# Patient Record
Sex: Female | Born: 1950
Health system: Southern US, Community
[De-identification: ages and names within clinical notes are randomized; demographics above are authoritative.]

## PROBLEM LIST (undated history)

## (undated) DIAGNOSIS — I5032 Chronic diastolic (congestive) heart failure: Secondary | ICD-10-CM

## (undated) DIAGNOSIS — F419 Anxiety disorder, unspecified: Secondary | ICD-10-CM

## (undated) DIAGNOSIS — I2510 Atherosclerotic heart disease of native coronary artery without angina pectoris: Secondary | ICD-10-CM

## (undated) DIAGNOSIS — F458 Other somatoform disorders: Secondary | ICD-10-CM

## (undated) DIAGNOSIS — F1320 Sedative, hypnotic or anxiolytic dependence, uncomplicated: Secondary | ICD-10-CM

## (undated) DIAGNOSIS — I10 Essential (primary) hypertension: Secondary | ICD-10-CM

## (undated) DIAGNOSIS — E7800 Pure hypercholesterolemia, unspecified: Secondary | ICD-10-CM

## (undated) DIAGNOSIS — F32A Depression, unspecified: Secondary | ICD-10-CM

## (undated) DIAGNOSIS — J309 Allergic rhinitis, unspecified: Secondary | ICD-10-CM

## (undated) DIAGNOSIS — G8929 Other chronic pain: Secondary | ICD-10-CM

## (undated) DIAGNOSIS — E876 Hypokalemia: Secondary | ICD-10-CM

## (undated) DIAGNOSIS — F329 Major depressive disorder, single episode, unspecified: Secondary | ICD-10-CM

## (undated) DIAGNOSIS — G43909 Migraine, unspecified, not intractable, without status migrainosus: Secondary | ICD-10-CM

## (undated) DIAGNOSIS — M79673 Pain in unspecified foot: Secondary | ICD-10-CM

## (undated) DIAGNOSIS — I519 Heart disease, unspecified: Secondary | ICD-10-CM

## (undated) DIAGNOSIS — M069 Rheumatoid arthritis, unspecified: Secondary | ICD-10-CM

## (undated) DIAGNOSIS — I4891 Unspecified atrial fibrillation: Secondary | ICD-10-CM

## (undated) DIAGNOSIS — F332 Major depressive disorder, recurrent severe without psychotic features: Secondary | ICD-10-CM

## (undated) DIAGNOSIS — G5600 Carpal tunnel syndrome, unspecified upper limb: Secondary | ICD-10-CM

## (undated) HISTORY — DX: Other chronic pain: G89.29

## (undated) HISTORY — DX: Major depressive disorder, single episode, unspecified: F32.9

## (undated) HISTORY — DX: Chronic diastolic (congestive) heart failure: I50.32

## (undated) HISTORY — DX: Essential (primary) hypertension: I10

## (undated) HISTORY — DX: Atherosclerotic heart disease of native coronary artery without angina pectoris: I25.10

## (undated) HISTORY — DX: Hypokalemia: E87.6

## (undated) HISTORY — PX: CARPAL TUNNEL RELEASE: SHX101

## (undated) HISTORY — PX: OTHER SURGICAL HISTORY: SHX169

## (undated) HISTORY — DX: Migraine, unspecified, not intractable, without status migrainosus: G43.909

## (undated) HISTORY — DX: Other somatoform disorders: F45.8

## (undated) HISTORY — DX: Unspecified atrial fibrillation: I48.91

## (undated) HISTORY — DX: Anxiety disorder, unspecified: F41.9

## (undated) HISTORY — DX: Rheumatoid arthritis, unspecified: M06.9

## (undated) HISTORY — DX: Pain in unspecified foot: M79.673

## (undated) HISTORY — DX: Heart disease, unspecified: I51.9

## (undated) HISTORY — DX: Depression, unspecified: F32.A

## (undated) HISTORY — DX: Allergic rhinitis, unspecified: J30.9

## (undated) HISTORY — DX: Pure hypercholesterolemia, unspecified: E78.00

---

## 1984-02-26 DIAGNOSIS — J189 Pneumonia, unspecified organism: Secondary | ICD-10-CM

## 1984-02-26 HISTORY — DX: Pneumonia, unspecified organism: J18.9

## 1998-02-25 HISTORY — PX: OTHER SURGICAL HISTORY: SHX169

## 1998-12-13 ENCOUNTER — Other Ambulatory Visit: Admission: RE | Admit: 1998-12-13 | Discharge: 1998-12-13 | Payer: Self-pay | Admitting: Obstetrics and Gynecology

## 1999-02-26 HISTORY — PX: VAGINAL HYSTERECTOMY: SUR661

## 1999-08-24 ENCOUNTER — Other Ambulatory Visit: Admission: RE | Admit: 1999-08-24 | Discharge: 1999-08-24 | Payer: Self-pay | Admitting: Obstetrics and Gynecology

## 1999-08-24 ENCOUNTER — Encounter (INDEPENDENT_AMBULATORY_CARE_PROVIDER_SITE_OTHER): Payer: Self-pay

## 1999-09-25 ENCOUNTER — Inpatient Hospital Stay (HOSPITAL_COMMUNITY): Admission: RE | Admit: 1999-09-25 | Discharge: 1999-09-27 | Payer: Self-pay | Admitting: Obstetrics and Gynecology

## 1999-09-25 ENCOUNTER — Encounter (INDEPENDENT_AMBULATORY_CARE_PROVIDER_SITE_OTHER): Payer: Self-pay

## 1999-10-24 ENCOUNTER — Encounter: Admission: RE | Admit: 1999-10-24 | Discharge: 1999-10-24 | Payer: Self-pay | Admitting: Family Medicine

## 1999-10-24 ENCOUNTER — Encounter: Payer: Self-pay | Admitting: Family Medicine

## 2000-02-26 HISTORY — PX: TOTAL ABDOMINAL HYSTERECTOMY W/ BILATERAL SALPINGOOPHORECTOMY: SHX83

## 2000-12-19 ENCOUNTER — Encounter: Payer: Self-pay | Admitting: Family Medicine

## 2000-12-19 ENCOUNTER — Encounter: Admission: RE | Admit: 2000-12-19 | Discharge: 2000-12-19 | Payer: Self-pay | Admitting: Family Medicine

## 2001-12-21 ENCOUNTER — Encounter: Admission: RE | Admit: 2001-12-21 | Discharge: 2001-12-21 | Payer: Self-pay | Admitting: Family Medicine

## 2001-12-21 ENCOUNTER — Encounter: Payer: Self-pay | Admitting: Family Medicine

## 2003-05-03 ENCOUNTER — Encounter: Admission: RE | Admit: 2003-05-03 | Discharge: 2003-05-03 | Payer: Self-pay | Admitting: Family Medicine

## 2004-06-08 ENCOUNTER — Encounter: Admission: RE | Admit: 2004-06-08 | Discharge: 2004-06-08 | Payer: Self-pay | Admitting: Obstetrics and Gynecology

## 2005-05-28 ENCOUNTER — Ambulatory Visit (HOSPITAL_BASED_OUTPATIENT_CLINIC_OR_DEPARTMENT_OTHER): Admission: RE | Admit: 2005-05-28 | Discharge: 2005-05-28 | Payer: Self-pay | Admitting: Orthopedic Surgery

## 2005-06-13 ENCOUNTER — Encounter: Admission: RE | Admit: 2005-06-13 | Discharge: 2005-06-13 | Payer: Self-pay | Admitting: Family Medicine

## 2006-02-25 HISTORY — PX: FOOT SURGERY: SHX648

## 2006-08-28 ENCOUNTER — Encounter: Admission: RE | Admit: 2006-08-28 | Discharge: 2006-08-28 | Payer: Self-pay | Admitting: Family Medicine

## 2007-09-29 ENCOUNTER — Encounter: Admission: RE | Admit: 2007-09-29 | Discharge: 2007-09-29 | Payer: Self-pay | Admitting: Family Medicine

## 2008-10-05 ENCOUNTER — Encounter: Admission: RE | Admit: 2008-10-05 | Discharge: 2008-10-05 | Payer: Self-pay | Admitting: Family Medicine

## 2008-10-12 ENCOUNTER — Encounter: Admission: RE | Admit: 2008-10-12 | Discharge: 2008-10-12 | Payer: Self-pay | Admitting: Family Medicine

## 2009-11-15 ENCOUNTER — Encounter: Admission: RE | Admit: 2009-11-15 | Discharge: 2009-11-15 | Payer: Self-pay | Admitting: Family Medicine

## 2010-03-18 ENCOUNTER — Encounter: Payer: Self-pay | Admitting: Family Medicine

## 2010-06-23 ENCOUNTER — Emergency Department (HOSPITAL_COMMUNITY)
Admission: EM | Admit: 2010-06-23 | Discharge: 2010-06-24 | Disposition: A | Discharge status: Other Acute Inpatient Hospital | Payer: BC Managed Care – PPO | Attending: Emergency Medicine | Admitting: Emergency Medicine

## 2010-06-23 DIAGNOSIS — F19239 Other psychoactive substance dependence with withdrawal, unspecified: Secondary | ICD-10-CM | POA: Insufficient documentation

## 2010-06-23 DIAGNOSIS — F19939 Other psychoactive substance use, unspecified with withdrawal, unspecified: Secondary | ICD-10-CM | POA: Insufficient documentation

## 2010-06-23 DIAGNOSIS — F341 Dysthymic disorder: Secondary | ICD-10-CM | POA: Insufficient documentation

## 2010-06-23 DIAGNOSIS — F410 Panic disorder [episodic paroxysmal anxiety] without agoraphobia: Secondary | ICD-10-CM | POA: Insufficient documentation

## 2010-06-23 DIAGNOSIS — R110 Nausea: Secondary | ICD-10-CM | POA: Insufficient documentation

## 2010-06-23 DIAGNOSIS — F1920 Other psychoactive substance dependence, uncomplicated: Secondary | ICD-10-CM | POA: Insufficient documentation

## 2010-06-23 LAB — RAPID URINE DRUG SCREEN, HOSP PERFORMED
Amphetamines: NOT DETECTED
Barbiturates: NOT DETECTED
Benzodiazepines: POSITIVE — AB
Cocaine: NOT DETECTED
Opiates: POSITIVE — AB

## 2010-06-23 LAB — CBC
HCT: 44 % (ref 36.0–46.0)
Hemoglobin: 14.8 g/dL (ref 12.0–15.0)
MCH: 30.1 pg (ref 26.0–34.0)
MCHC: 33.6 g/dL (ref 30.0–36.0)
MCV: 89.6 fL (ref 78.0–100.0)
Platelets: 215 K/uL (ref 150–400)
RBC: 4.91 MIL/uL (ref 3.87–5.11)
RDW: 13.1 % (ref 11.5–15.5)
WBC: 4.4 K/uL (ref 4.0–10.5)

## 2010-06-23 LAB — DIFFERENTIAL
Basophils Absolute: 0 10*3/uL (ref 0.0–0.1)
Basophils Relative: 1 % (ref 0–1)
Eosinophils Absolute: 0.2 10*3/uL (ref 0.0–0.7)
Eosinophils Relative: 4 % (ref 0–5)
Monocytes Absolute: 0.3 10*3/uL (ref 0.1–1.0)
Monocytes Relative: 7 % (ref 3–12)
Neutro Abs: 2.7 10*3/uL (ref 1.7–7.7)

## 2010-06-23 LAB — ETHANOL: Alcohol, Ethyl (B): <5 mg/dL (ref 0–10)

## 2010-06-23 LAB — COMPREHENSIVE METABOLIC PANEL WITH GFR
ALT: 15 U/L (ref 0–35)
AST: 20 U/L (ref 0–37)
Albumin: 3.7 g/dL (ref 3.5–5.2)
Alkaline Phosphatase: 63 U/L (ref 39–117)
BUN: 9 mg/dL (ref 6–23)
CO2: 28 meq/L (ref 19–32)
Calcium: 9.4 mg/dL (ref 8.4–10.5)
Chloride: 105 meq/L (ref 96–112)
Creatinine, Ser: 0.64 mg/dL (ref 0.4–1.2)
GFR calc non Af Amer: >60 mL/min
Glucose, Bld: 97 mg/dL (ref 70–99)
Potassium: 3.7 meq/L (ref 3.5–5.1)
Sodium: 141 meq/L (ref 135–145)
Total Bilirubin: 0.4 mg/dL (ref 0.3–1.2)
Total Protein: 6.5 g/dL (ref 6.0–8.3)

## 2010-06-24 ENCOUNTER — Inpatient Hospital Stay (HOSPITAL_COMMUNITY)
Admission: EM | Admit: 2010-06-24 | Discharge: 2010-06-27 | DRG: 745 | Disposition: A | Discharge status: Home / Self Care | Payer: BC Managed Care – PPO | Source: Intra-hospital | Attending: Psychiatry | Admitting: Psychiatry

## 2010-06-24 DIAGNOSIS — E785 Hyperlipidemia, unspecified: Secondary | ICD-10-CM

## 2010-06-24 DIAGNOSIS — E7800 Pure hypercholesterolemia, unspecified: Secondary | ICD-10-CM

## 2010-06-24 DIAGNOSIS — F1120 Opioid dependence, uncomplicated: Principal | ICD-10-CM

## 2010-06-24 DIAGNOSIS — F329 Major depressive disorder, single episode, unspecified: Secondary | ICD-10-CM

## 2010-06-24 DIAGNOSIS — G8929 Other chronic pain: Secondary | ICD-10-CM

## 2010-06-24 DIAGNOSIS — F3289 Other specified depressive episodes: Secondary | ICD-10-CM

## 2010-06-26 DIAGNOSIS — F1120 Opioid dependence, uncomplicated: Secondary | ICD-10-CM

## 2010-06-26 DIAGNOSIS — F339 Major depressive disorder, recurrent, unspecified: Secondary | ICD-10-CM

## 2010-06-29 NOTE — Discharge Summary (Signed)
Meghan Schwartz                  ACCOUNT NO.:  [id number]  MEDICAL RECORD NO.:  [id number]           PATIENT TYPE:  I  LOCATION:  0303                          FACILITY[facility]  [facility]  PHYSICIAN:  Anselm Jungling, MD  DATE OF BIRTH:  1950/09/09  DATE OF ADMISSION:  06/24/2010 DATE OF DISCHARGE:  06/27/2010                              DISCHARGE SUMMARY   IDENTIFYING DATA AND REASON FOR ADMISSION:  This was an inpatient psychiatric admission for Meghan Schwartz, a 60 year old female who was admitted for treatment of opiate dependence and depression.  Please refer to the admission note for further details pertaining to the symptoms, circumstances and history that led to her hospitalization.  She was given an initial Axis I diagnosis of opiate dependence NOS and depressive disorder NOS.  MEDICAL AND LABORATORY:  The patient was medically and physically assessed by the psychiatric nurse practitioner.  She was in generally good health without any active or chronic medical problems.  She was continued on her usual regimen of Allegra, naproxen, calcium carbonate, estradiol and simvastatin.  There were no significant medical issues during her stay.  HOSPITAL COURSE:  The patient was admitted to the adult inpatient psychiatric service.  She presented as a well-nourished, normally- developed adult female who was very pleasant and cooperative.  Mood was sad, but there were no indications of suicidal risk at any time.  She was continued on her usual regimen of Celexa and Effexor, antidepressants that she had been taking previously.  The patient has a history of a foot deformity which had been causing chronic pain.  Use of opiate analgesics for that pain was what led to her opiate dependence.  She was fully in acknowledgment of the need to be detoxified from opiates and to avoid them for the rest of her life.  She participated in therapeutic groups and activities and was an excellent participant  throughout her stay.  She worked closely with Case Management and the undersigned toward an aftercare plan that could address her various treatment needs.  Ultimately, she agreed to be enrolled in the [facility] chemical dependency intensive outpatient program.  She was interviewed by Charmian Muff, program coordinator, and accepted into the program.  She was discharged on May 2nd with a plan to begin the intensive outpatient program on May 4th, the next day that it was to me.  DISCHARGE MEDICATIONS: 1. Celexa 20 mg daily. 2. Effexor XR 37.5 mg b.i.d. 3. Allegra 180 mg daily. 4. Naproxen 500 mg b.i.d. 5. Calcium carbonate tablet 1 daily. 6. Estradiol 1 mg daily. 7. Simvastatin 20 mg daily.  DISCHARGE DIAGNOSES:  Axis I:  Opiate dependence, pain disorder not otherwise specified, depressive disorder not otherwise specified. Axis II:  Deferred. Axis III:  History of SEASONAL ALLERGIES, chronic pain, hyperlipidemia. Axis IV:  Stressors severe. Axis V:  Global Assessment of Functioning on discharge 45.  On discharge, the suicide risk assessment was completed, and she was rated at minimal risk.     Anselm Jungling, MD     SPB/MEDQ  D:  06/28/2010  T:  06/28/2010  Job:  [id number]  Electronically Signed by Geralyn Flash MD on 06/29/2010 09:03:11 AM

## 2010-07-02 NOTE — H&P (Signed)
NAMEADYN, HOES                  ACCOUNT NO.:  [id number]  MEDICAL RECORD NO.:  [id number]           PATIENT TYPE:  I  LOCATION:  0303                          [facility]ACILITY[facility]  [facility]  PHYSICIAN:  Anselm Jungling, MD  DATE OF BIRTH:  [birth year]  DATE OF ADMISSION:  06/24/2010 DATE OF DISCHARGE:                      PSYCHIATRIC ADMISSION ASSESSMENT   HISTORY:  This is a voluntary admission to the services of Dr. Electa Sniff. This is a 60 year old married white female.  Apparently, about 2 years ago, it became apparent that she was going to need some foot surgery; she had surgery on her other foot back in 2001, and she was delaying the surgery because she was getting ready to retire.  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  She has been getting, originally they started her on some tramadol for her foot pain and then it was escalated to the Vicodin.  She innocently became addicted to the Vicodin, and April 4 after she got her refill she has decided she did not want any drugs in her life anymore and she through the pills away.  She went into withdrawal that night and had to go see her primary care the next day. They worked on a taper plan, but she found she was taking more out of anxiety-related to her symptoms when she through the pills away.  She decided to come for help with detoxing from it so that she could continue her life without addictive medications.  PAST PSYCHIATRIC HISTORY:  She does not have any social history.  She received her BA in 1973.  She has been married once for 39 years.  She has a daughter 14.  a son 65.  She recently retired at the end of January.  She was a Runner, broadcasting/film/video.  FAMILY HISTORY:  Negative.  ALCOHOL AND DRUG HISTORY:  Negative.  PRIMARY CARE PROVIDER:  Candie Echevaria, M.D.  PSYCHIATRIC CARE:  She does not have any.  MEDICAL PROBLEMS:  She requires estrogen replacement.  She also has hypercholesteremia and she has a bunion and tendon issues  in her foot.  MEDICATIONS:  She is currently prescribed tramadol 8 mg t.i.d., odansetron 8 mg t.i.d., Vicodin 7.5/500 b.i.d., Celexa 20 mg daily, vitamin D three 400 mg daily, vitamin B12 100 mcg daily, simvastatin 20 mg at bedtime, Centrum Silver 1 tablet daily, Allegra 180 mg daily, tramadol 100 mg t.i.d. Xanax 0.25 mg p.r.n., vitamin C 1000 mg daily, venlafaxine 37.5 mg b.i.d., estradiol 1 mg daily, Tums 1 tablet daily and aspirin 81 mg daily.  DRUG ALLERGIES:  She has no known drug allergies.  POSITIVE PHYSICAL FINDINGS:  She was medically cleared in the ED at [facility].  Her temperature was afebrile 98.3.  Her blood pressure was elevated at 178/113.  Her pulse was 80, respirations were 20.  Her UDS was positive for opiates and benzodiazepines; however, she is prescribed.  Alcohol level was less than 5.  She had no abnormalities of CBC or electrolytes.  She had no other lab work done.  MENTAL STATUS EXAM:  She was seen in her room.  She was  in bed.  She was appropriately groomed, dressed and nourished.  Her speech is within normal range.  Her mood is anxious but she feels that she will be able to withdraw here safely.  Her affect has a normal range.  Thought processes are clear, rational and goal-oriented.  Judgment, insight are intact.  Concentration and memory are intact.  Intelligence is at least average.  She denies being suicidal or homicidal.  She denies any auditory or visual hallucinations.  DIAGNOSIS:  AXIS I:  Opioid dependence, here for medically supervised withdrawal and let me make this clear; she has not been abusive.  This was an innocent dependence. AXIS II:  Deferred. AXIS III:  Hypercholesteremia requires estrogen replacement. AXIS IV:  Mild recent retirement and substance dependence. AXIS V:  45.  PLAN:  The plan is to admit for safety and stabilization. She will be helped to withdraw from the hydrocodone through use of the clonidine detox protocol.  Her  other meds will be adjusted if indicated and estimated length of stay is about a week.     Mickie Leonarda Salon, P.A.-C.   ______________________________ Anselm Jungling, MD    MD/MEDQ  D:  06/24/2010  T:  06/24/2010  Job:  [id number]  cc:   Otilio Connors. Gerri Spore, M.D. Fax: [phone]  Electronically Signed by Jaci Lazier ADAMS P.A.-C. on 06/30/2010 12:49:29 PM Electronically Signed by Geralyn Flash MD on 07/02/2010 12:11:38 PM

## 2010-07-12 ENCOUNTER — Ambulatory Visit (HOSPITAL_BASED_OUTPATIENT_CLINIC_OR_DEPARTMENT_OTHER): Payer: BC Managed Care – PPO | Admitting: Psychology

## 2010-07-12 DIAGNOSIS — F1120 Opioid dependence, uncomplicated: Secondary | ICD-10-CM

## 2010-07-13 NOTE — Discharge Summary (Signed)
[facility]  Patient:    Meghan Schwartz, Meghan Schwartz                         MRN: [id number] Adm. Date:  [id number] Disc. Date: [id number] Attending:  Michaele Offer                           Discharge Summary  ADMISSION DIAGNOSIS:   Symptomatic leiomyomatous uterus  DISCHARGE DIAGNOSIS:   Symptomatic leiomyomatous uterus and adenomyosis.  PROCEDURES:  Total abdominal hysterectomy and bilateral salpingo-oophorectomy.  COMPLICATIONS:  None.  CONSULTATIONS:  None.  HISTORY AND PHYSICAL:  Briefly, this is a 60 year old white female, gravida 2, para 2-0-0-2, with heavy menses which are gradually getting heavier to the point where she can use 30 tampons a day.  She has missed plane flights due to her bleeding and has severe cramping and burning also.  She has not had success with medical therapy and desires surgical therapy.  Ultrasound has revealed multiple small leiomyomas.  PAST MEDICAL HISTORY:  Significant for two cesarsean sections without complications, pneumonia in 1985, tonsillectomy, tubal ligation with one of her C sections.  ALLERGIES:  Nausea with ERYTHROMYCIN.  PHYSICAL EXAMINATION:  Significant for a benign abdomen with a well-healed transverse scar.  On pelvic exam, she has a 10 to 12-week size, irregular uterus which is mobile, no adnexal masses.  HOSPITAL COURSE:  The patient was admitted on the day of surgery and underwent the above-mentioned procedure under general anesthesia with an estimated blood loss of 200 cc.  She tolerated the procedure very well.  Preoperative hemoglobin was 12.0, postoperative was 10.9.  The patient was rapidly able to ambulated and tolerated diet.  On the evening of postoperative day #2, she was tolerating a regular diet, had remained afebrile for approximately 24 hours, was ambulating, and was felt to be stable enough for discharge home.  Her incision was healing well.  At this time, her staples were removed  and Steri-Strips applied.  Final pathology reveals benign cervix, proliferative endometrium, extensive adenomyosis, and few benign leiomyomata with normal tubes and ovaries.  CONDITION ON DISCHARGE:  Stable.  DISPOSITION:  Discharge to home.  DIET:  Regular.  ACTIVITY:  Pelvic rest.  No driving, no strenuous activity.  FOLLOWUP:  In 10 days for an incision check.  DISCHARGE MEDICATIONS:  Percocet p.r.n. pain and estradiol 1 mg p.o. q.d. for estrogen replacement therapy. DD:  09/27/99 TD:  09/29/99 Job: [id number]

## 2010-07-13 NOTE — Op Note (Signed)
Meghan Schwartz, Meghan Schwartz                  ACCOUNT NO.:  [id number]   MEDICAL RECORD NO.:  [id number]          PATIENT TYPE:  AMB   LOCATION:  DSC                          FACILITY:  [facility]   PHYSICIAN:  Leonides Grills, M.D.     DATE OF BIRTH:  08-01-1950   DATE OF PROCEDURE:  05/28/2005  DATE OF DISCHARGE:                                 OPERATIVE REPORT   PREOPERATIVE DIAGNOSES:  1.  Left hallux valgus.  2.  Left second and third metatarsalgia.  3.  Left second through fourth hammer toes.   POSTOPERATIVE DIAGNOSES:  1.  Left hallux valgus.  2.  Left second and third metatarsalgia.  3.  Left second through fourth hammer toes.   OPERATION PERFORMED:  1.  Left chevron bunionectomy.  2.  Stress x-rays left foot.  3.  Left second and third Weil metatarsal shortening osteotomies.  4.  Left second through fourth toes metatarsophalangeal joint dorsal      capsulotomy with split collateral release.  5.  Left second through fourth toes proximal phalanx head resections.  6.  Left second through fourth toes extensor digitorum brevis to extensor      digitorum longus tendon transfers.   SURGEON:  Leonides Grills, M.D.   ASSISTANT:  Lianne Cure, P.A.   ANESTHESIA:  General.   ESTIMATED BLOOD LOSS:  Minimal.   TOURNIQUET TIME:  Approximately an hour and 20 minutes.   COMPLICATIONS:  None.   DISPOSITION:  Stable to PR.   INDICATIONS FOR PROCEDURE:  The patient is a 59 year old female who has had  longstanding left forefoot pain that was interfering with her life to the  point she can not do what she wants to do secondary to the above pathology.  She has consented for the above procedure.  All risks which include  infection, neurovascular injury, nonunion, malunion, hardware irritation,  hardware failure, persistent pain, worsening pain, prolonged recovery,  stiffness, or arthritis were all explained, questions were encouraged and  answered.   DESCRIPTION OF PROCEDURE:  The patient was  brought to the operating room and  placed in supine position after adequate general endotracheal tube  anesthesia was administered as well as Ancef 1 g IV piggyback.  The left  lower extremity was prepped and draped in sterile manner over a proximally  placed thigh tourniquet.  The limb was gravity exsanguinated.  Tourniquet  was elevated to 290 mmHg.  A longitudinal incision over the medial aspect of  midline of the left great toe MTP joint was then made.  Dissection was  carried down through the skin.  Hemostasis was obtained.  Neurovascular  structures were identified both superiorly and inferiorly and protected  throughout this case.  L-shaped capsulotomy was then made.  Simple  bunionectomy was then performed with a sagittal saw.  [name]  was rounded off with a rongeur.  Lateral capsule was then released with a  curved Beaver blade.  Chevron osteotomy was then made with a sagittal saw  protecting the soft tissues superiorly and inferiorly and centering on the  metatarsal head.  The head was then translated approximately 3 to 4 mm  laterally and then fixed with a 2.0 mm fully threaded cortical set screw  using a 1.5 mm drill hole respectively. This had excellent purchase and  maintenance of the correction.  The redundant bone medially was then trimmed  off with the sagittal saw.  The area and joint were then copiously irrigated  with normal saline.  Capsule was reconstructed and advanced both superiorly  and proximally and repaired with 2-0 Vicryl suture.  This had an Interior and spatial designer. Range of motion of the toe was excellent as well.  Stress x-rays  were obtained in the AP and lateral plane and showed no gross motion across  the osteotomy site. Fixation in proper position and correction was excellent  as well.  We then made a longitudinal incision over the second toe.  Dissection was carried down through the skin and hemostasis was obtained.  Extensor digitorum longus  and brevis tendons were identified and the longus  was tenotomized proximal medial and the brevis distal lateral and retracted  out of harm's way for later transfer.  Dorsal MTP joint capsulotomy with  collateral release was then performed with a 15 blade scalpel protecting the  soft tissues both medially and laterally by an assistant.  The distal aspect  of the proximal phalanx head was then skeletonized and the head was then  removed with rongeur followed by a bone cutter.  This cut was made  perpendicular to the long axis of the proximal phalanx.  The same exact  procedure was performed for the third and fourth toes through separate  incisions.  Once we were to this stage, we then plantar flexed the second  toe and performed a Weil osteotomy parallel to the plantar aspect of the  foot. This was done with stacked sagittal saw blades. The head was then  translated about 3 or 4 mm proximally and then fixed with a 1.5 mm fully  threaded cortical lag screw using 1.5, 1.1 mm drill hole respectively. This  had excellent purchase and compression across the osteotomy.  The redundant  bone shelf dorsally was then trimmed off with a rongeur and the joint and  area were copiously irrigated with normal saline.  We did the same exact  procedure for the third metatarsal as well through the separate incision and  stress x-rays were obtained in the AP and lateral planes and showed that the  fixation and correction were excellent as well as the right length.  The  length was also visualized directly through both wounds at the same time as  well.  The area and joints were copiously irrigated with normal saline.  We  then placed a K-wire through the middle and distal phalanx antegrade holding  the toe reduced in the PIP joint and firing this retrograde holding the toe  in the reduced position.  We did this for the second, third and fourth toes respectively.  We then performed and EDB to EDL tendon transfer  using 3-0  PDS suture.  This had an Conservation officer, historic buildings.  Tourniquet was deflated.  Hemostasis was obtained.  Skin relieving incisions were made on either side  of the K-wire.  The K-wires were bent, cut and capped.  Toes pinked up  nicely __________ at the tip. There was no pulsatile bleeding.  The skin was  closed with 4-0 nylon.  Sterile dressing was applied.  Roger Mann dressing  was applied.  Hard sole shoe was  applied.  The patient was stable to the PR.      Leonides Grills, M.D.  Electronically Signed     PB/MEDQ  D:  05/28/2005  T:  05/29/2005  Job:  [id number]

## 2010-07-13 NOTE — H&P (Signed)
[facility]  Patient:    Meghan Schwartz                          MRN: [id number] Adm. Date:  [id number] Attending:  Michaele Offer                         History and Physical  CHIEF COMPLAINT:  Menorrhagia and leiomyomatous uterus.  HISTORY OF PRESENT ILLNESS:  This is a 60 year old white female, gravida 2, para 2-0-0-2, with an LMP August 04, 1999, whom I saw on June 29 as a referral from Dr. Clyde Canterbury.  She complains of heavy menses for 20 years, which are gradually getting heavier.  She passes clots and at the heaviest, she uses 30 tampons a day.  She has bled so much that she has missed plane flights due to her bleeding and also has severe cramping and burning with her periods.  She has irregular menses, had one in December 2000, February of this year, April of this year, June of this year, which lasted for 21 days.  She also complains of hot flashes since January of this year.  She has tried Vioxx for these symptoms but cannot take this because it causes tachycardia, and she has tried oral contraceptives in the past but cannot tolerate these because she feels heavy and bloated.  She has no dyspareunia, has no significant bowel symptoms, but does have some urinary frequency.  She has had a pelvic ultrasound performed December 1999, which revealed several leiomyomas.  Endometrial biopsy done here on June 29 revealed degenerating mixed-phase endometrium.  On exam, she has an enlarged uterus consistent with leiomyomas, and she is admitted for surgical therapy of this.  PAST OBSTETRICAL HISTORY:  Significant for cesarean sections in 1978 and 1983 without complications.  PAST MEDICAL HISTORY:  Pneumonia in 1985.  PAST SURGICAL HISTORY:  Tonsillectomy, tubal ligation, and two cesarean sections.  ALLERGIES:  None known, but she does have nausea with erythromycin.  MEDICATIONS:  Vitamin C and E.  GYNECOLOGIC HISTORY:  Menarche at age 47.  Current  cycle is every 44-70 days, and they are heavy with severe cramping.  She has no history of an abnormal Pap smear.  No history of sexually transmitted diseases.  SOCIAL HISTORY:  Patient is married, smokes about five cigarettes a day, and denies significant alcohol abuse.  The patient is employed by [employer].  FAMILY HISTORY:  Mother had breast cancer at age 69 and has died due to this.  REVIEW OF SYSTEMS:  Otherwise negative.  PHYSICAL EXAMINATION:  VITAL SIGNS:  Weight is 130 pounds.  GENERAL:  She is a well-developed, well-nourished white female, who is in no acute distress.  HEENT:  Pupils equally round and reactive to light and accommodation, and her extraocular muscles are intact.  Oropharynx was clear without erythema or exudates.  NECK:  Supple without lymphadenopathy or thyromegaly.  LUNGS:  Clear to auscultation.  HEART:  Regular rate and rhythm without murmurs.  ABDOMEN:  Soft, nontender, nondistended, without a palpable mass.  She has a well-healed transverse scar.  EXTREMITIES:  Extremities have no edema, are nontender, and DTRs are 2/4 and symmetric.  PELVIC:  External genitalia is without lesions.  Speculum exam reveals blood in the vaginal vault, and a Pipelle sounded to 8 cm.  On bimanual exam, she has a 10-12 week size irregular uterus, which is mobile, and she  has no adnexal masses.  LABORATORY DATA:  Labs reveal an office hemoglobin of 10.4 and an FSH of 20.  ASSESSMENT:  Abnormal uterine bleeding with a symptomatic leiomyomatous uterus.  Medical and surgical options have been discussed with the patient. Medical options included taking iron, NSAIDs, and waiting for menopause, or treatment with Lupron, and surgical treatments consisted of myomectomy or hysterectomy.  The patient desires definitive therapy with hysterectomy. Risks of this procedure have been discussed with the patient, including bleeding, infection, damage to the  surrounding organs.  We have also agreed to remove her ovaries at the same time, and risks and benefits of estrogen replacement have been discussed with the patient.  She has been on iron for the past four weeks preoperatively.  PLAN:  Admit the patient for a total abdominal hysterectomy, bilateral salpingo-oophorectomy. DD:  09/25/99 TD:  09/25/99 Job: [id number]

## 2010-07-13 NOTE — Op Note (Signed)
[facility]  Patient:    Meghan Schwartz                          MRN: [id number] Proc. Date: 09/25/99 Adm. Date:  [id number] Attending:  Michaele Offer                           Operative Report  PREOPERATIVE DIAGNOSIS:  Symptomatic leiomyomatous uterus.  POSTOPERATIVE DIAGNOSIS:  Symptomatic leiomyomatous uterus.  PROCEDURE:  Total abdominal hysterectomy and bilateral salpingo-oophorectomy.  SURGEON:  Zenaida Niece, M.D.  ASSISTANT:  Alvino Chapel, M.D.  ANESTHESIA:  General endotracheal tube.  ESTIMATED BLOOD LOSS:  200 cc.  ______ PROPHYLAXIS:  Ancef 1 g preoperative.  FINDINGS:  The uterus slightly enlarged with normal tubes and ovaries.  She also had a normal appendix.  Counts were correct and condition stable.  DESCRIPTION OF PROCEDURE:  After appropriate informed consent was obtained, the patient was taken to the operating room and placed in the dorsal supine position.  General anesthesia was induced and her abdomen was prepped and draped in the usual sterile fashion and a Foley catheter inserted.  The vagina was also prepped with Betadine.  Her abdomen was then entered via her previous Pfannenstiel incision.  A self-retaining retractor was placed and the bowels were packed out of the pelvis.  Inspection revealed the above mentioned findings with the uterus slightly enlarged and slightly irregular, consistent with small leiomyomas.  Both uterine cornu were grasped with long Kellys.  The round ligaments were divided with electrocautery and the anterior peritoneum incised to create the bladder flap.  Both infundibulopelvic ligaments were isolated, clamped, transected, and doubly ligated with #1 chromic.  The uterine arteries were then skeletonized.  Pitressin was injected medially to both uterine arteries to help _______ blood loss.  This was 10 units and 10 cc of normal saline.  The uterine arteries were then clamped,  transected, and ligated with #1 chromic.  Cardinal ligaments and uterosacral ligaments were clamped, transected, and ligated with #1 chromic.  The vaginal angles were then clamped with _______ clamps and the vagina entered.  The uterus with an intact cervix was then removed sharply.  The vaginal angles were ligated with #1 chromic and the remainder of the vagina closed with two interrupted figure-of-eight sutures of #1 chromic.  The uterosacral ligaments were then plicated in the midline with one suture of 2-0 silk.  The previously tagged uterosacral ligament pedicles were also tied across the midline.  The pelvis was then irrigated and small bleeders from the peritoneum controlled with electrocautery.  Both ureters were identified and found to be well below the incision areas and of normal caliber.  The pelvis was again inspected and found to be hemostatic.  The bowel packs were removed and the self-retaining retractor was removed.  The subfascial space was then irrigated and made hemostatic with electrocautery.  The fascia was closed in a running fashion starting at both ends and meeting in the middle with 0 Vicryl.  The subcutaneous tissue was irrigated and made hemostatic with electrocautery and the skin was closed with staples.  The patient was awakened in the operating room, tolerating the procedure well and was taken to the recovery room in stable condition. DD:  09/25/99 TD:  09/26/99 Job: [id number]

## 2010-07-26 ENCOUNTER — Encounter (HOSPITAL_BASED_OUTPATIENT_CLINIC_OR_DEPARTMENT_OTHER): Payer: BC Managed Care – PPO | Admitting: Psychology

## 2010-07-26 DIAGNOSIS — F1120 Opioid dependence, uncomplicated: Secondary | ICD-10-CM

## 2010-12-03 ENCOUNTER — Other Ambulatory Visit: Payer: Self-pay | Admitting: Family Medicine

## 2010-12-03 DIAGNOSIS — Z1231 Encounter for screening mammogram for malignant neoplasm of breast: Secondary | ICD-10-CM

## 2010-12-25 ENCOUNTER — Ambulatory Visit
Admission: RE | Admit: 2010-12-25 | Discharge: 2010-12-25 | Disposition: A | Discharge status: Home / Self Care | Payer: BC Managed Care – PPO | Source: Ambulatory Visit | Attending: Family Medicine | Admitting: Family Medicine

## 2010-12-25 DIAGNOSIS — Z1231 Encounter for screening mammogram for malignant neoplasm of breast: Secondary | ICD-10-CM

## 2011-01-01 ENCOUNTER — Other Ambulatory Visit: Payer: Self-pay | Admitting: Family Medicine

## 2011-01-01 DIAGNOSIS — R928 Other abnormal and inconclusive findings on diagnostic imaging of breast: Secondary | ICD-10-CM

## 2011-01-18 ENCOUNTER — Ambulatory Visit
Admission: RE | Admit: 2011-01-18 | Discharge: 2011-01-18 | Disposition: A | Discharge status: Home / Self Care | Payer: BC Managed Care – PPO | Source: Ambulatory Visit | Attending: Family Medicine | Admitting: Family Medicine

## 2011-01-18 DIAGNOSIS — R928 Other abnormal and inconclusive findings on diagnostic imaging of breast: Secondary | ICD-10-CM

## 2012-03-06 ENCOUNTER — Other Ambulatory Visit: Payer: Self-pay | Admitting: Family Medicine

## 2012-03-06 DIAGNOSIS — Z1231 Encounter for screening mammogram for malignant neoplasm of breast: Secondary | ICD-10-CM

## 2012-03-30 ENCOUNTER — Ambulatory Visit: Payer: BC Managed Care – PPO

## 2012-04-10 ENCOUNTER — Ambulatory Visit: Payer: BC Managed Care – PPO

## 2012-06-22 ENCOUNTER — Ambulatory Visit
Admission: RE | Admit: 2012-06-22 | Discharge: 2012-06-22 | Disposition: A | Discharge status: Home / Self Care | Payer: BC Managed Care – PPO | Source: Ambulatory Visit | Attending: Family Medicine | Admitting: Family Medicine

## 2012-06-22 DIAGNOSIS — Z1231 Encounter for screening mammogram for malignant neoplasm of breast: Secondary | ICD-10-CM

## 2014-06-08 ENCOUNTER — Encounter: Payer: Self-pay | Admitting: Neurology

## 2014-06-08 ENCOUNTER — Ambulatory Visit (INDEPENDENT_AMBULATORY_CARE_PROVIDER_SITE_OTHER): Payer: BC Managed Care – PPO | Admitting: Neurology

## 2014-06-08 VITALS — BP 140/88 | HR 85 | Temp 98.0°F | Ht 64.0 in | Wt 146.5 lb

## 2014-06-08 DIAGNOSIS — G5601 Carpal tunnel syndrome, right upper limb: Secondary | ICD-10-CM

## 2014-06-08 DIAGNOSIS — G5600 Carpal tunnel syndrome, unspecified upper limb: Secondary | ICD-10-CM

## 2014-06-08 DIAGNOSIS — G5602 Carpal tunnel syndrome, left upper limb: Secondary | ICD-10-CM

## 2014-06-08 DIAGNOSIS — G5603 Carpal tunnel syndrome, bilateral upper limbs: Secondary | ICD-10-CM

## 2014-06-08 HISTORY — DX: Carpal tunnel syndrome, unspecified upper limb: G56.00

## 2014-06-08 NOTE — Patient Instructions (Signed)
Overall you are doing fairly well but I do want to suggest a few things today:   Remember to drink plenty of fluid, eat healthy meals and do not skip any meals. Try to eat protein with a every meal and eat a healthy snack such as fruit or nuts in between meals. Try to keep a regular sleep-wake schedule and try to exercise daily, particularly in the form of walking, 20-30 minutes a day, if you can.   As far as diagnostic testing: emg/ncs  I would like to see you back in a week for emg/ncs, sooner if we need to. Please call us with any interim questions, concerns, problems, updates or refill requests.   Please also call us for any test results so we can go over those with you on the phone.  My clinical assistant and will answer any of your questions and relay your messages to me and also relay most of my messages to you.   Our phone number is [phone]. We also have an after hours call service for urgent matters and there is a physician on-call for urgent questions. For any emergencies you know to call 911 or go to the nearest emergency room

## 2014-06-08 NOTE — Progress Notes (Signed)
[facility]    Provider:  Dr Lucia Gaskins Referring Provider: Carolin Coy, MD Primary Care Physician:  Frederich Chick, MD  CC:  Hand numbness  HPI:  Meghan Schwartz is a 64 y.o. female here as a referral from Dr. Gerri Spore for hand numbness. PMHx RA.  She has hand numbness for 15 years. Used to feel it when she was scared on the highway. Heavy rain. Immediate hand numbness then it would go away. Getting worse now, now numb all the time and prickly. Now she is waking up in the middle of the night and the pain wakes her up. Worse when typing like on an Ipad. Tingly now, all the fingers, weakness of grip, drops things, can't open a lid. Dropping objects. Wrist splints at night didn't help. No radiation in the forearms. In all the fingers. No neck pain. No radicular symptoms. Both are symmetric. She has RA. Her first injection of Enbrel helped with numbness.   Reviewed notes, labs and imaging from outside physicians, which showed: CC+ (37), RF+. Hand U/S - symmetric Synovitis without erosions. BUN 14. Creatinine 0.6.   Review of Systems: Patient complains of symptoms per HPI as well as the following symptoms: fatigue, feeling hot, flushing, joint pain, joint swelling, allergies, memory loss, confusion, headache, numbness, anxiety, allergies. Pertinent negatives per HPI. All others negative.   History   Social History  . Marital Status: Married    Spouse Name: Arpita Fentress  . Number of Children: 2  . Years of Education: Ba   Occupational History  . Retired     Social History Main Topics  . Smoking status: Former Smoker -- 20 years    Types: Cigarettes    Quit date: 02/25/2010  . Smokeless tobacco: Not on file  . Alcohol Use: No  . Drug Use: No  . Sexual Activity: Not on file   Other Topics Concern  . Not on file   Social History Narrative   Lives at home with spouse.    Right handed.   Caffeine use: 2 cups coffee/day    8 oz soda/day        History  reviewed. No pertinent family history.  Past Medical History  Diagnosis Date  . RA (rheumatoid arthritis)   . Anxiety   . High cholesterol     Past Surgical History  Procedure Laterality Date  . Foot surgery Left 2008  . Vaginal hysterectomy  2001  . Cesarean section  1978. 1983    Current Outpatient Prescriptions  Medication Sig Dispense Refill  . amphetamine-dextroamphetamine (ADDERALL XR) 10 MG 24 hr capsule Take 10 mg by mouth as needed.  0  . Ascorbic Acid (VITAMIN C) 1000 MG tablet Take 1,000 mg by mouth daily.    . citalopram (CELEXA) 40 MG tablet Take 40 mg by mouth daily.  0  . estradiol (ESTRACE) 1 MG tablet Take 1 mg by mouth daily.  0  . etanercept (ENBREL) 50 MG/ML injection Inject 50 mg into the skin once a week. Self injection once per week    . Multiple Vitamins-Minerals (CENTRUM SILVER ULTRA WOMENS PO) Take 1 tablet by mouth daily.    . simvastatin (ZOCOR) 20 MG tablet Take 20 mg by mouth daily.  0  . traMADol (ULTRAM) 50 MG tablet Take 50 mg by mouth as needed.  0  . Vitamin D, Cholecalciferol, 1000 UNITS CAPS Take 1 capsule by mouth daily.     No current facility-administered medications for this  visit.    Allergies as of 06/08/2014  . (No Known Allergies)    Vitals: BP 140/88 mmHg  Pulse 85  Temp(Src) 98 F (36.7 C)  Ht 5\' 4"  (1.626 m)  Wt 146 lb 8 oz (66.452 kg)  BMI 25.13 kg/m2 Last Weight:  Wt Readings from Last 1 Encounters:  06/08/14 146 lb 8 oz (66.452 kg)   Last Height:   Ht Readings from Last 1 Encounters:  06/08/14 5\' 4"  (1.626 m)    Physical exam: Exam: Gen: NAD, conversant, well nourised, well groomed                     CV: RRR, no MRG. No Carotid Bruits. No peripheral edema, warm, nontender Eyes: Conjunctivae clear without exudates or hemorrhage  Neuro: Detailed Neurologic Exam  Speech:    Speech is normal; fluent and spontaneous with normal comprehension.  Cognition:    The patient is oriented to person, place, and  time;     recent and remote memory intact;     language fluent;     normal attention, concentration,     fund of knowledge Cranial Nerves:    The pupils are equal, round, and reactive to light. The fundi are normal and spontaneous venous pulsations are present. Visual fields are full to finger confrontation. Extraocular movements are intact. Trigeminal sensation is intact and the muscles of mastication are normal. The face is symmetric. The palate elevates in the midline. Hearing intact. Voice is normal. Shoulder shrug is normal. The tongue has normal motion without fasciculations.   Coordination:    Normal finger to nose and heel to shin. Normal rapid alternating movements.   Gait:    Heel-toe and tandem gait are normal.   Motor Observation:    Distal atrophy in the hands Tone:    Normal muscle tone.    Posture:    Posture is normal. normal erect    Strength:    Strength is V/V in the upper and lower limbs.      Sensation: intact to LT     Reflex Exam:  DTR's:    Deep tendon reflexes in the upper and lower extremities are normal bilaterally.   Toes:    The toes are downgoing bilaterally.   Clonus:    Clonus is absent.      Assessment/Plan:  64 year old female with RA and complaints of paresthesias in the hands.   EMG/NCS bilat to evaluate for bilateral NCS in the next few weeks  , MD  [facility] [address] [place], [address] [place]  Phone [phone] Fax [phone]

## 2014-06-16 ENCOUNTER — Ambulatory Visit (INDEPENDENT_AMBULATORY_CARE_PROVIDER_SITE_OTHER): Payer: BC Managed Care – PPO | Admitting: Neurology

## 2014-06-16 ENCOUNTER — Ambulatory Visit (INDEPENDENT_AMBULATORY_CARE_PROVIDER_SITE_OTHER): Payer: Self-pay | Admitting: Neurology

## 2014-06-16 DIAGNOSIS — G5602 Carpal tunnel syndrome, left upper limb: Secondary | ICD-10-CM

## 2014-06-16 DIAGNOSIS — G5603 Carpal tunnel syndrome, bilateral upper limbs: Secondary | ICD-10-CM

## 2014-06-16 DIAGNOSIS — G5601 Carpal tunnel syndrome, right upper limb: Secondary | ICD-10-CM | POA: Diagnosis not present

## 2014-06-16 NOTE — Progress Notes (Signed)
See procedure note.

## 2014-06-16 NOTE — Procedures (Signed)
  [facility]    Provider: Dr Ahern Referring Provider: Westermann, Carola, MD Primary Care Physician: WEBB, CAROL D, MD  CC: Hand numbness  HPI: Meghan Schwartz is a 63 y.o. female here as a referral from Dr. Westermann for hand numbness. PMHx RA.  She has hand numbness for 15 years. Used to feel it when she was scared on the highway. Heavy rain. Immediate hand numbness then it would go away. Getting worse now, now numb all the time and prickly. Now she is waking up in the middle of the night and the pain wakes her up. Worse when typing like on an Ipad. Tingly now, all the fingers, weakness of grip, drops things, can't open a lid. Dropping objects. Wrist splints at night didn't help. No radiation in the forearms. In all the fingers. No neck pain. No radicular symptoms. Both are symmetric. She has RA. Her first injection of Enbrel helped with numbness.    Summary:   Nerve Conduction Studies were performed on the bilateral upper extremities.  The right median APB motor nerve showed prolonged distal onset latency (5.5 ms, N<4.0)  The right Median 2nd Digit sensory nerve showed prolonged distal peak latency (5.3 ms, N<3.9). F Wave studies indicate that the right Median F wave has delayed latency(34.6, N<34ms).  The left median APB motor nerve showed prolonged distal onset latency (7.4 ms, N<4.0) and reduced amplitude (.17mV, N>3) The left Median 2nd Digit sensory nerve showed no response F Wave studies indicate that the left Median F wave has delayed latency(37, N<34ms).   Bilateral Ulnar ADM motor nerves were within normal limits. F Wave studies indicate that the bilateral Ulnar F waves have normal latencies  The bilateralUlnar 5th digit sensory nerves were within normal limits.  EMG needle study of selected right upper extremity muscles was performed. The following muscles were attempted: the left opponens pollicis. EMG needle exam was terminated prematurely at  patient's request due to pain.   Conclusion: This is an abnormal but limited exam study. There is electrophysiologic evidence of bilateral moderately-severe left > right Carpal Tunnel Syndrome. No suggestion of polyneuropathy or radiculopathy. Clinical correlation recommended.    Antonia Ahern, MD  [facility] [address] [place], [place] [address]  Phone [phone] Fax [phone] 

## 2014-06-16 NOTE — Progress Notes (Signed)
  [facility]    Provider: Dr Lucia Gaskins Referring Provider: Carolin Coy, MD Primary Care Physician: Frederich Chick, MD  CC: Hand numbness  HPI: Meghan Schwartz is a 64 y.o. female here as a referral from Dr. Gerri Spore for hand numbness. PMHx RA.  She has hand numbness for 15 years. Used to feel it when she was scared on the highway. Heavy rain. Immediate hand numbness then it would go away. Getting worse now, now numb all the time and prickly. Now she is waking up in the middle of the night and the pain wakes her up. Worse when typing like on an Ipad. Tingly now, all the fingers, weakness of grip, drops things, can't open a lid. Dropping objects. Wrist splints at night didn't help. No radiation in the forearms. In all the fingers. No neck pain. No radicular symptoms. Both are symmetric. She has RA. Her first injection of Enbrel helped with numbness.    Summary:   Nerve Conduction Studies were performed on the bilateral upper extremities.  The right median APB motor nerve showed prolonged distal onset latency (5.5 ms, N<4.0)  The right Median 2nd Digit sensory nerve showed prolonged distal peak latency (5.3 ms, N<3.9). F Wave studies indicate that the right Median F wave has delayed latency(34.6, N<13ms).  The left median APB motor nerve showed prolonged distal onset latency (7.4 ms, N<4.0) and reduced amplitude (.22mV, N>3) The left Median 2nd Digit sensory nerve showed no response F Wave studies indicate that the left Median F wave has delayed latency(37, N<29ms).   Bilateral Ulnar ADM motor nerves were within normal limits. F Wave studies indicate that the bilateral Ulnar F waves have normal latencies  The bilateralUlnar 5th digit sensory nerves were within normal limits.  EMG needle study of selected right upper extremity muscles was performed. The following muscles were attempted: the left opponens pollicis. EMG needle exam was terminated prematurely at  patient's request due to pain.   Conclusion: This is an abnormal but limited exam study. There is electrophysiologic evidence of bilateral moderately-severe left > right Carpal Tunnel Syndrome. No suggestion of polyneuropathy or radiculopathy. Clinical correlation recommended.    Naomie Dean, MD  [facility] [address] [place], [place] [address]  Phone [phone] Fax [phone]

## 2014-08-15 DIAGNOSIS — G5602 Carpal tunnel syndrome, left upper limb: Secondary | ICD-10-CM | POA: Diagnosis not present

## 2014-08-15 DIAGNOSIS — M79642 Pain in left hand: Secondary | ICD-10-CM | POA: Diagnosis not present

## 2014-08-15 DIAGNOSIS — M79641 Pain in right hand: Secondary | ICD-10-CM | POA: Diagnosis not present

## 2014-08-15 DIAGNOSIS — G5601 Carpal tunnel syndrome, right upper limb: Secondary | ICD-10-CM | POA: Diagnosis not present

## 2014-08-18 DIAGNOSIS — M2550 Pain in unspecified joint: Secondary | ICD-10-CM | POA: Diagnosis not present

## 2014-08-18 DIAGNOSIS — M79643 Pain in unspecified hand: Secondary | ICD-10-CM | POA: Diagnosis not present

## 2014-08-18 DIAGNOSIS — G5600 Carpal tunnel syndrome, unspecified upper limb: Secondary | ICD-10-CM | POA: Diagnosis not present

## 2014-08-18 DIAGNOSIS — M0579 Rheumatoid arthritis with rheumatoid factor of multiple sites without organ or systems involvement: Secondary | ICD-10-CM | POA: Diagnosis not present

## 2014-09-16 DIAGNOSIS — F900 Attention-deficit hyperactivity disorder, predominantly inattentive type: Secondary | ICD-10-CM | POA: Diagnosis not present

## 2014-11-01 DIAGNOSIS — G5602 Carpal tunnel syndrome, left upper limb: Secondary | ICD-10-CM | POA: Diagnosis not present

## 2014-11-07 DIAGNOSIS — Z4789 Encounter for other orthopedic aftercare: Secondary | ICD-10-CM | POA: Diagnosis not present

## 2014-11-07 DIAGNOSIS — G5601 Carpal tunnel syndrome, right upper limb: Secondary | ICD-10-CM | POA: Diagnosis not present

## 2014-11-14 DIAGNOSIS — N951 Menopausal and female climacteric states: Secondary | ICD-10-CM | POA: Diagnosis not present

## 2014-11-14 DIAGNOSIS — F3342 Major depressive disorder, recurrent, in full remission: Secondary | ICD-10-CM | POA: Diagnosis not present

## 2014-11-14 DIAGNOSIS — L304 Erythema intertrigo: Secondary | ICD-10-CM | POA: Diagnosis not present

## 2014-11-14 DIAGNOSIS — E780 Pure hypercholesterolemia: Secondary | ICD-10-CM | POA: Diagnosis not present

## 2014-11-14 DIAGNOSIS — Z23 Encounter for immunization: Secondary | ICD-10-CM | POA: Diagnosis not present

## 2014-11-14 DIAGNOSIS — G8929 Other chronic pain: Secondary | ICD-10-CM | POA: Diagnosis not present

## 2014-11-14 DIAGNOSIS — Z1211 Encounter for screening for malignant neoplasm of colon: Secondary | ICD-10-CM | POA: Diagnosis not present

## 2014-11-14 DIAGNOSIS — M1990 Unspecified osteoarthritis, unspecified site: Secondary | ICD-10-CM | POA: Diagnosis not present

## 2014-11-14 DIAGNOSIS — I10 Essential (primary) hypertension: Secondary | ICD-10-CM | POA: Diagnosis not present

## 2014-11-18 DIAGNOSIS — M79642 Pain in left hand: Secondary | ICD-10-CM | POA: Diagnosis not present

## 2014-12-09 DIAGNOSIS — F900 Attention-deficit hyperactivity disorder, predominantly inattentive type: Secondary | ICD-10-CM | POA: Diagnosis not present

## 2014-12-26 DIAGNOSIS — M79672 Pain in left foot: Secondary | ICD-10-CM | POA: Diagnosis not present

## 2014-12-26 DIAGNOSIS — M0579 Rheumatoid arthritis with rheumatoid factor of multiple sites without organ or systems involvement: Secondary | ICD-10-CM | POA: Diagnosis not present

## 2014-12-26 DIAGNOSIS — M79671 Pain in right foot: Secondary | ICD-10-CM | POA: Diagnosis not present

## 2014-12-26 DIAGNOSIS — M79642 Pain in left hand: Secondary | ICD-10-CM | POA: Diagnosis not present

## 2014-12-26 DIAGNOSIS — M79641 Pain in right hand: Secondary | ICD-10-CM | POA: Diagnosis not present

## 2015-01-04 DIAGNOSIS — M1990 Unspecified osteoarthritis, unspecified site: Secondary | ICD-10-CM | POA: Diagnosis not present

## 2015-03-01 DIAGNOSIS — M0579 Rheumatoid arthritis with rheumatoid factor of multiple sites without organ or systems involvement: Secondary | ICD-10-CM | POA: Diagnosis not present

## 2015-03-01 DIAGNOSIS — M79642 Pain in left hand: Secondary | ICD-10-CM | POA: Diagnosis not present

## 2015-03-01 DIAGNOSIS — M79641 Pain in right hand: Secondary | ICD-10-CM | POA: Diagnosis not present

## 2015-03-28 DIAGNOSIS — J329 Chronic sinusitis, unspecified: Secondary | ICD-10-CM | POA: Diagnosis not present

## 2015-03-28 DIAGNOSIS — M1990 Unspecified osteoarthritis, unspecified site: Secondary | ICD-10-CM | POA: Diagnosis not present

## 2015-03-31 DIAGNOSIS — F900 Attention-deficit hyperactivity disorder, predominantly inattentive type: Secondary | ICD-10-CM | POA: Diagnosis not present

## 2015-06-02 ENCOUNTER — Other Ambulatory Visit: Payer: Self-pay

## 2015-06-02 DIAGNOSIS — Z0000 Encounter for general adult medical examination without abnormal findings: Secondary | ICD-10-CM | POA: Diagnosis not present

## 2015-06-02 DIAGNOSIS — E782 Mixed hyperlipidemia: Secondary | ICD-10-CM | POA: Diagnosis not present

## 2015-06-02 DIAGNOSIS — Z1231 Encounter for screening mammogram for malignant neoplasm of breast: Secondary | ICD-10-CM

## 2015-06-02 DIAGNOSIS — M1990 Unspecified osteoarthritis, unspecified site: Secondary | ICD-10-CM | POA: Diagnosis not present

## 2015-06-02 DIAGNOSIS — Z1211 Encounter for screening for malignant neoplasm of colon: Secondary | ICD-10-CM | POA: Diagnosis not present

## 2015-06-02 DIAGNOSIS — I10 Essential (primary) hypertension: Secondary | ICD-10-CM | POA: Diagnosis not present

## 2015-06-22 ENCOUNTER — Ambulatory Visit
Admission: RE | Admit: 2015-06-22 | Discharge: 2015-06-22 | Disposition: A | Discharge status: Home / Self Care | Payer: Medicare Other | Source: Ambulatory Visit

## 2015-06-22 DIAGNOSIS — Z1231 Encounter for screening mammogram for malignant neoplasm of breast: Secondary | ICD-10-CM

## 2015-06-23 DIAGNOSIS — F900 Attention-deficit hyperactivity disorder, predominantly inattentive type: Secondary | ICD-10-CM | POA: Diagnosis not present

## 2015-07-03 DIAGNOSIS — M8589 Other specified disorders of bone density and structure, multiple sites: Secondary | ICD-10-CM | POA: Diagnosis not present

## 2015-07-03 DIAGNOSIS — Z780 Asymptomatic menopausal state: Secondary | ICD-10-CM | POA: Diagnosis not present

## 2015-08-14 DIAGNOSIS — M069 Rheumatoid arthritis, unspecified: Secondary | ICD-10-CM | POA: Diagnosis not present

## 2015-09-12 DIAGNOSIS — Z79899 Other long term (current) drug therapy: Secondary | ICD-10-CM | POA: Diagnosis not present

## 2015-09-12 DIAGNOSIS — M0579 Rheumatoid arthritis with rheumatoid factor of multiple sites without organ or systems involvement: Secondary | ICD-10-CM | POA: Diagnosis not present

## 2015-09-12 DIAGNOSIS — M79641 Pain in right hand: Secondary | ICD-10-CM | POA: Diagnosis not present

## 2015-09-12 DIAGNOSIS — M79642 Pain in left hand: Secondary | ICD-10-CM | POA: Diagnosis not present

## 2015-09-15 DIAGNOSIS — F900 Attention-deficit hyperactivity disorder, predominantly inattentive type: Secondary | ICD-10-CM | POA: Diagnosis not present

## 2015-12-01 DIAGNOSIS — Z23 Encounter for immunization: Secondary | ICD-10-CM | POA: Diagnosis not present

## 2015-12-01 DIAGNOSIS — M1990 Unspecified osteoarthritis, unspecified site: Secondary | ICD-10-CM | POA: Diagnosis not present

## 2015-12-01 DIAGNOSIS — M069 Rheumatoid arthritis, unspecified: Secondary | ICD-10-CM | POA: Diagnosis not present

## 2015-12-04 DIAGNOSIS — Z1211 Encounter for screening for malignant neoplasm of colon: Secondary | ICD-10-CM | POA: Diagnosis not present

## 2015-12-08 DIAGNOSIS — F900 Attention-deficit hyperactivity disorder, predominantly inattentive type: Secondary | ICD-10-CM | POA: Diagnosis not present

## 2015-12-13 DIAGNOSIS — M79642 Pain in left hand: Secondary | ICD-10-CM | POA: Diagnosis not present

## 2015-12-13 DIAGNOSIS — M0579 Rheumatoid arthritis with rheumatoid factor of multiple sites without organ or systems involvement: Secondary | ICD-10-CM | POA: Diagnosis not present

## 2015-12-13 DIAGNOSIS — M79641 Pain in right hand: Secondary | ICD-10-CM | POA: Diagnosis not present

## 2015-12-13 DIAGNOSIS — Z79899 Other long term (current) drug therapy: Secondary | ICD-10-CM | POA: Diagnosis not present

## 2016-01-25 DIAGNOSIS — J019 Acute sinusitis, unspecified: Secondary | ICD-10-CM | POA: Diagnosis not present

## 2016-03-29 DIAGNOSIS — F900 Attention-deficit hyperactivity disorder, predominantly inattentive type: Secondary | ICD-10-CM | POA: Diagnosis not present

## 2016-06-03 DIAGNOSIS — F331 Major depressive disorder, recurrent, moderate: Secondary | ICD-10-CM | POA: Diagnosis not present

## 2016-06-03 DIAGNOSIS — F5101 Primary insomnia: Secondary | ICD-10-CM | POA: Diagnosis not present

## 2016-06-03 DIAGNOSIS — Z1211 Encounter for screening for malignant neoplasm of colon: Secondary | ICD-10-CM | POA: Diagnosis not present

## 2016-06-03 DIAGNOSIS — E782 Mixed hyperlipidemia: Secondary | ICD-10-CM | POA: Diagnosis not present

## 2016-06-03 DIAGNOSIS — N951 Menopausal and female climacteric states: Secondary | ICD-10-CM | POA: Diagnosis not present

## 2016-06-03 DIAGNOSIS — Z0000 Encounter for general adult medical examination without abnormal findings: Secondary | ICD-10-CM | POA: Diagnosis not present

## 2016-06-03 DIAGNOSIS — G894 Chronic pain syndrome: Secondary | ICD-10-CM | POA: Diagnosis not present

## 2016-06-03 DIAGNOSIS — Z23 Encounter for immunization: Secondary | ICD-10-CM | POA: Diagnosis not present

## 2016-06-03 DIAGNOSIS — I10 Essential (primary) hypertension: Secondary | ICD-10-CM | POA: Diagnosis not present

## 2016-06-27 DIAGNOSIS — Z1211 Encounter for screening for malignant neoplasm of colon: Secondary | ICD-10-CM | POA: Diagnosis not present

## 2016-09-13 DIAGNOSIS — F900 Attention-deficit hyperactivity disorder, predominantly inattentive type: Secondary | ICD-10-CM | POA: Diagnosis not present

## 2016-12-10 ENCOUNTER — Other Ambulatory Visit: Payer: Self-pay | Admitting: Family Medicine

## 2016-12-10 DIAGNOSIS — Z1231 Encounter for screening mammogram for malignant neoplasm of breast: Secondary | ICD-10-CM

## 2016-12-27 ENCOUNTER — Ambulatory Visit
Admission: RE | Admit: 2016-12-27 | Discharge: 2016-12-27 | Disposition: A | Discharge status: Home / Self Care | Payer: Medicare Other | Source: Ambulatory Visit | Attending: Family Medicine | Admitting: Family Medicine

## 2016-12-27 ENCOUNTER — Encounter: Payer: Self-pay | Admitting: Radiology

## 2016-12-27 DIAGNOSIS — Z1231 Encounter for screening mammogram for malignant neoplasm of breast: Secondary | ICD-10-CM

## 2017-10-01 ENCOUNTER — Emergency Department (HOSPITAL_COMMUNITY)
Admission: EM | Admit: 2017-10-01 | Discharge: 2017-10-02 | Disposition: A | Discharge status: Other Acute Inpatient Hospital | Payer: Medicare Other | Attending: Emergency Medicine | Admitting: Emergency Medicine

## 2017-10-01 ENCOUNTER — Encounter (HOSPITAL_COMMUNITY): Payer: Self-pay

## 2017-10-01 DIAGNOSIS — F1120 Opioid dependence, uncomplicated: Secondary | ICD-10-CM | POA: Insufficient documentation

## 2017-10-01 DIAGNOSIS — R51 Headache: Secondary | ICD-10-CM | POA: Insufficient documentation

## 2017-10-01 DIAGNOSIS — R45851 Suicidal ideations: Secondary | ICD-10-CM | POA: Insufficient documentation

## 2017-10-01 DIAGNOSIS — F32A Depression, unspecified: Secondary | ICD-10-CM

## 2017-10-01 DIAGNOSIS — Z79899 Other long term (current) drug therapy: Secondary | ICD-10-CM | POA: Insufficient documentation

## 2017-10-01 DIAGNOSIS — Z87891 Personal history of nicotine dependence: Secondary | ICD-10-CM | POA: Insufficient documentation

## 2017-10-01 DIAGNOSIS — F329 Major depressive disorder, single episode, unspecified: Secondary | ICD-10-CM | POA: Insufficient documentation

## 2017-10-01 DIAGNOSIS — M069 Rheumatoid arthritis, unspecified: Secondary | ICD-10-CM | POA: Insufficient documentation

## 2017-10-01 NOTE — ED Triage Notes (Signed)
Pt arrived by EMS from home. EMS reports that Pt has been taking extra of her medications and is now missing 30 Hydrocodone, 14 Klonopin, and 24 Tramadol. EMS reports that they found Pt lying at the top her stairs on arrival. Pt  Is disoriented to time year. Pt is suicidal with no plan.

## 2017-10-01 NOTE — ED Notes (Signed)
Bed: RESA Expected date:  Expected time:  Means of arrival:  Comments: EMS/overdose 

## 2017-10-01 NOTE — ED Notes (Signed)
ED Provider at bedside. 

## 2017-10-02 ENCOUNTER — Emergency Department (HOSPITAL_COMMUNITY): Payer: Medicare Other

## 2017-10-02 ENCOUNTER — Inpatient Hospital Stay (HOSPITAL_COMMUNITY)
Admission: AD | Admit: 2017-10-02 | Discharge: 2017-10-05 | DRG: 885 | Disposition: A | Discharge status: Home / Self Care | Payer: Medicare Other | Source: Intra-hospital | Attending: Psychiatry | Admitting: Psychiatry

## 2017-10-02 ENCOUNTER — Encounter (HOSPITAL_COMMUNITY): Payer: Self-pay

## 2017-10-02 ENCOUNTER — Other Ambulatory Visit: Payer: Self-pay

## 2017-10-02 DIAGNOSIS — F329 Major depressive disorder, single episode, unspecified: Secondary | ICD-10-CM | POA: Diagnosis present

## 2017-10-02 DIAGNOSIS — Z803 Family history of malignant neoplasm of breast: Secondary | ICD-10-CM | POA: Diagnosis not present

## 2017-10-02 DIAGNOSIS — F1120 Opioid dependence, uncomplicated: Secondary | ICD-10-CM | POA: Diagnosis present

## 2017-10-02 DIAGNOSIS — M069 Rheumatoid arthritis, unspecified: Secondary | ICD-10-CM | POA: Diagnosis present

## 2017-10-02 DIAGNOSIS — E7800 Pure hypercholesterolemia, unspecified: Secondary | ICD-10-CM | POA: Diagnosis present

## 2017-10-02 DIAGNOSIS — F419 Anxiety disorder, unspecified: Secondary | ICD-10-CM | POA: Diagnosis present

## 2017-10-02 DIAGNOSIS — F1320 Sedative, hypnotic or anxiolytic dependence, uncomplicated: Secondary | ICD-10-CM

## 2017-10-02 DIAGNOSIS — F332 Major depressive disorder, recurrent severe without psychotic features: Secondary | ICD-10-CM | POA: Diagnosis present

## 2017-10-02 DIAGNOSIS — Z9181 History of falling: Secondary | ICD-10-CM | POA: Diagnosis not present

## 2017-10-02 DIAGNOSIS — G8929 Other chronic pain: Secondary | ICD-10-CM | POA: Diagnosis present

## 2017-10-02 DIAGNOSIS — R45851 Suicidal ideations: Secondary | ICD-10-CM | POA: Diagnosis present

## 2017-10-02 DIAGNOSIS — Z90710 Acquired absence of both cervix and uterus: Secondary | ICD-10-CM

## 2017-10-02 DIAGNOSIS — Z87891 Personal history of nicotine dependence: Secondary | ICD-10-CM | POA: Diagnosis not present

## 2017-10-02 DIAGNOSIS — Z8261 Family history of arthritis: Secondary | ICD-10-CM | POA: Diagnosis not present

## 2017-10-02 DIAGNOSIS — Z8349 Family history of other endocrine, nutritional and metabolic diseases: Secondary | ICD-10-CM

## 2017-10-02 HISTORY — DX: Major depressive disorder, recurrent severe without psychotic features: F33.2

## 2017-10-02 LAB — CBC
HEMATOCRIT: 43.3 % (ref 36.0–46.0)
HEMOGLOBIN: 14.4 g/dL (ref 12.0–15.0)
MCH: 29.4 pg (ref 26.0–34.0)
MCHC: 33.3 g/dL (ref 30.0–36.0)
MCV: 88.4 fL (ref 78.0–100.0)
Platelets: 304 10*3/uL (ref 150–400)
RBC: 4.9 MIL/uL (ref 3.87–5.11)
RDW: 13.7 % (ref 11.5–15.5)
WBC: 5.2 10*3/uL (ref 4.0–10.5)

## 2017-10-02 LAB — COMPREHENSIVE METABOLIC PANEL
ALBUMIN: 3.4 g/dL — AB (ref 3.5–5.0)
ALK PHOS: 65 U/L (ref 38–126)
ALT: 17 U/L (ref 0–44)
ANION GAP: 9 (ref 5–15)
AST: 19 U/L (ref 15–41)
BUN: 18 mg/dL (ref 8–23)
CHLORIDE: 110 mmol/L (ref 98–111)
CO2: 28 mmol/L (ref 22–32)
Calcium: 9.2 mg/dL (ref 8.9–10.3)
Creatinine, Ser: 0.49 mg/dL (ref 0.44–1.00)
GFR calc Af Amer: >60 mL/min (ref >60)
GFR calc non Af Amer: >60 mL/min (ref >60)
GLUCOSE: 92 mg/dL (ref 70–99)
POTASSIUM: 3.8 mmol/L (ref 3.5–5.1)
SODIUM: 147 mmol/L — AB (ref 135–145)
Total Bilirubin: 0.3 mg/dL (ref 0.3–1.2)
Total Protein: 6.4 g/dL — ABNORMAL LOW (ref 6.5–8.1)

## 2017-10-02 LAB — RAPID URINE DRUG SCREEN, HOSP PERFORMED
Amphetamines: NOT DETECTED
BARBITURATES: NOT DETECTED
BENZODIAZEPINES: POSITIVE — AB
COCAINE: NOT DETECTED
OPIATES: NOT DETECTED
TETRAHYDROCANNABINOL: NOT DETECTED

## 2017-10-02 LAB — SALICYLATE LEVEL
SALICYLATE LVL: 18.9 mg/dL (ref 2.8–30.0)
Salicylate Lvl: 21.9 mg/dL (ref 2.8–30.0)
Salicylate Lvl: 15.1 mg/dL (ref 2.8–30.0)

## 2017-10-02 LAB — MAGNESIUM: MAGNESIUM: 2.2 mg/dL (ref 1.7–2.4)

## 2017-10-02 LAB — ACETAMINOPHEN LEVEL

## 2017-10-02 LAB — ETHANOL: Alcohol, Ethyl (B): <10 mg/dL (ref <10)

## 2017-10-02 MED ORDER — ETANERCEPT 50 MG/ML ~~LOC~~ SOSY: 50.0000 mg | PREFILLED_SYRINGE | SUBCUTANEOUS | Status: DC

## 2017-10-02 MED ORDER — ONDANSETRON 4 MG PO TBDP: 4.0000 mg | ORAL_TABLET | Freq: Four times a day (QID) | ORAL | Status: DC | PRN

## 2017-10-02 MED ORDER — ESTRADIOL 0.075 MG/24HR TD PTWK
MEDICATED_PATCH | TRANSDERMAL | Status: DC
  Administered 2017-10-02: 0.075 mg via TRANSDERMAL
  Filled 2017-10-02: qty 1

## 2017-10-02 MED ORDER — AMPHETAMINE-DEXTROAMPHET ER 5 MG PO CP24
15.0000 mg | ORAL_CAPSULE | Freq: Two times a day (BID) | ORAL | Status: DC | PRN
  Filled 2017-10-02: qty 1

## 2017-10-02 MED ORDER — HYDROXYCHLOROQUINE SULFATE 200 MG PO TABS
200.0000 mg | ORAL_TABLET | Freq: Every day | ORAL | Status: DC
  Administered 2017-10-02: 200 mg via ORAL
  Filled 2017-10-02: qty 1

## 2017-10-02 MED ORDER — THIAMINE HCL 100 MG/ML IJ SOLN: 100.0000 mg | Freq: Once | INTRAMUSCULAR | Status: DC

## 2017-10-02 MED ORDER — CHLORDIAZEPOXIDE HCL 25 MG PO CAPS: 25.0000 mg | ORAL_CAPSULE | Freq: Three times a day (TID) | ORAL | Status: DC

## 2017-10-02 MED ORDER — CLONIDINE HCL 0.1 MG PO TABS
0.1000 mg | ORAL_TABLET | Freq: Every day | ORAL | Status: DC
  Filled 2017-10-02: qty 1

## 2017-10-02 MED ORDER — POTASSIUM CHLORIDE CRYS ER 20 MEQ PO TBCR
40.0000 meq | EXTENDED_RELEASE_TABLET | Freq: Once | ORAL | Status: AC
  Administered 2017-10-02: 40 meq via ORAL
  Filled 2017-10-02: qty 2

## 2017-10-02 MED ORDER — VITAMIN B-1 100 MG PO TABS
100.0000 mg | ORAL_TABLET | Freq: Every day | ORAL | Status: DC
  Administered 2017-10-03 – 2017-10-05 (×3): 100 mg via ORAL
  Filled 2017-10-02 (×5): qty 1

## 2017-10-02 MED ORDER — TRAZODONE HCL 50 MG PO TABS
50.0000 mg | ORAL_TABLET | Freq: Every evening | ORAL | Status: DC | PRN
  Administered 2017-10-03: 50 mg via ORAL
  Filled 2017-10-02: qty 1

## 2017-10-02 MED ORDER — CHLORDIAZEPOXIDE HCL 25 MG PO CAPS
25.0000 mg | ORAL_CAPSULE | Freq: Four times a day (QID) | ORAL | Status: DC
Start: 2017-10-02 — End: 2017-10-03
  Administered 2017-10-02 – 2017-10-03 (×4): 25 mg via ORAL
  Filled 2017-10-02 (×5): qty 1

## 2017-10-02 MED ORDER — ADULT MULTIVITAMIN W/MINERALS CH
1.0000 | ORAL_TABLET | Freq: Every day | ORAL | Status: DC
  Administered 2017-10-03 – 2017-10-05 (×3): 1 via ORAL
  Filled 2017-10-02 (×5): qty 1

## 2017-10-02 MED ORDER — METHOCARBAMOL 500 MG PO TABS
500.0000 mg | ORAL_TABLET | Freq: Three times a day (TID) | ORAL | Status: DC | PRN
  Administered 2017-10-02: 500 mg via ORAL
  Filled 2017-10-02: qty 1

## 2017-10-02 MED ORDER — DICYCLOMINE HCL 20 MG PO TABS
20.0000 mg | ORAL_TABLET | Freq: Four times a day (QID) | ORAL | Status: DC | PRN
  Administered 2017-10-02: 20 mg via ORAL
  Filled 2017-10-02: qty 1

## 2017-10-02 MED ORDER — NICOTINE 21 MG/24HR TD PT24
21.0000 mg | MEDICATED_PATCH | Freq: Every day | TRANSDERMAL | Status: DC
  Administered 2017-10-02: 21 mg via TRANSDERMAL
  Filled 2017-10-02: qty 1

## 2017-10-02 MED ORDER — CHLORDIAZEPOXIDE HCL 25 MG PO CAPS: 25.0000 mg | ORAL_CAPSULE | Freq: Four times a day (QID) | ORAL | Status: DC | PRN

## 2017-10-02 MED ORDER — CHLORDIAZEPOXIDE HCL 25 MG PO CAPS: 25.0000 mg | ORAL_CAPSULE | ORAL | Status: DC

## 2017-10-02 MED ORDER — VITAMIN C 500 MG PO TABS
1000.0000 mg | ORAL_TABLET | Freq: Every day | ORAL | Status: DC
  Administered 2017-10-03 – 2017-10-05 (×3): 1000 mg via ORAL
  Filled 2017-10-02 (×5): qty 2

## 2017-10-02 MED ORDER — BIOTIN 3 MG PO TABS: ORAL_TABLET | Freq: Every day | ORAL | Status: DC

## 2017-10-02 MED ORDER — NICOTINE POLACRILEX 2 MG MT GUM
2.0000 mg | CHEWING_GUM | OROMUCOSAL | Status: DC | PRN
  Administered 2017-10-02 – 2017-10-05 (×11): 2 mg via ORAL
  Filled 2017-10-02 (×3): qty 1

## 2017-10-02 MED ORDER — HYDROXYZINE HCL 25 MG PO TABS
25.0000 mg | ORAL_TABLET | Freq: Four times a day (QID) | ORAL | Status: DC | PRN
  Administered 2017-10-02: 25 mg via ORAL
  Filled 2017-10-02: qty 1

## 2017-10-02 MED ORDER — CLONIDINE HCL 0.1 MG PO TABS
0.1000 mg | ORAL_TABLET | ORAL | Status: DC
  Administered 2017-10-04: 0.1 mg via ORAL
  Filled 2017-10-02 (×4): qty 1

## 2017-10-02 MED ORDER — MAGNESIUM HYDROXIDE 400 MG/5ML PO SUSP
30.0000 mL | Freq: Every day | ORAL | Status: DC | PRN
  Administered 2017-10-05: 30 mL via ORAL
  Filled 2017-10-02: qty 30

## 2017-10-02 MED ORDER — CITALOPRAM HYDROBROMIDE 10 MG PO TABS
40.0000 mg | ORAL_TABLET | Freq: Every day | ORAL | Status: DC
  Administered 2017-10-02: 40 mg via ORAL
  Filled 2017-10-02: qty 4

## 2017-10-02 MED ORDER — NAPROXEN 500 MG PO TABS: 500.0000 mg | ORAL_TABLET | Freq: Once | ORAL | Status: DC

## 2017-10-02 MED ORDER — HYDROXYCHLOROQUINE SULFATE 200 MG PO TABS
200.0000 mg | ORAL_TABLET | Freq: Every day | ORAL | Status: DC
  Administered 2017-10-03 – 2017-10-05 (×3): 200 mg via ORAL
  Filled 2017-10-02 (×5): qty 1

## 2017-10-02 MED ORDER — CLONIDINE HCL 0.1 MG PO TABS: 0.1000 mg | ORAL_TABLET | Freq: Every day | ORAL | Status: DC

## 2017-10-02 MED ORDER — CLONAZEPAM 0.5 MG PO TABS: 1.0000 mg | ORAL_TABLET | Freq: Every evening | ORAL | Status: DC | PRN

## 2017-10-02 MED ORDER — LOPERAMIDE HCL 2 MG PO CAPS: 2.0000 mg | ORAL_CAPSULE | ORAL | Status: DC | PRN

## 2017-10-02 MED ORDER — SIMVASTATIN 20 MG PO TABS
20.0000 mg | ORAL_TABLET | Freq: Every day | ORAL | Status: DC
  Administered 2017-10-02 – 2017-10-04 (×3): 20 mg via ORAL
  Filled 2017-10-02 (×6): qty 1

## 2017-10-02 MED ORDER — NICOTINE 21 MG/24HR TD PT24
21.0000 mg | MEDICATED_PATCH | Freq: Every day | TRANSDERMAL | Status: DC
  Filled 2017-10-02 (×4): qty 1

## 2017-10-02 MED ORDER — CLONIDINE HCL 0.1 MG PO TABS: 0.1000 mg | ORAL_TABLET | ORAL | Status: DC

## 2017-10-02 MED ORDER — CITALOPRAM HYDROBROMIDE 40 MG PO TABS
40.0000 mg | ORAL_TABLET | Freq: Every day | ORAL | Status: DC
  Administered 2017-10-03: 40 mg via ORAL
  Filled 2017-10-02 (×3): qty 1

## 2017-10-02 MED ORDER — DICYCLOMINE HCL 20 MG PO TABS: 20.0000 mg | ORAL_TABLET | Freq: Four times a day (QID) | ORAL | Status: DC | PRN

## 2017-10-02 MED ORDER — NAPROXEN 500 MG PO TABS
250.0000 mg | ORAL_TABLET | Freq: Two times a day (BID) | ORAL | Status: DC | PRN
  Administered 2017-10-02: 500 mg via ORAL
  Filled 2017-10-02: qty 1

## 2017-10-02 MED ORDER — CLONIDINE HCL 0.1 MG PO TABS
0.1000 mg | ORAL_TABLET | Freq: Four times a day (QID) | ORAL | Status: DC
  Administered 2017-10-02 (×2): 0.1 mg via ORAL
  Filled 2017-10-02 (×2): qty 1

## 2017-10-02 MED ORDER — ACETAMINOPHEN 325 MG PO TABS
650.0000 mg | ORAL_TABLET | Freq: Once | ORAL | Status: AC
  Administered 2017-10-02: 650 mg via ORAL
  Filled 2017-10-02: qty 2

## 2017-10-02 MED ORDER — NICOTINE POLACRILEX 2 MG MT GUM
CHEWING_GUM | OROMUCOSAL | Status: AC
  Administered 2017-10-02: 2 mg via ORAL
  Filled 2017-10-02: qty 1

## 2017-10-02 MED ORDER — ACETAMINOPHEN 325 MG PO TABS
650.0000 mg | ORAL_TABLET | Freq: Four times a day (QID) | ORAL | Status: DC | PRN
  Administered 2017-10-02 – 2017-10-05 (×4): 650 mg via ORAL
  Filled 2017-10-02 (×4): qty 2

## 2017-10-02 MED ORDER — VITAMIN C 500 MG PO TABS: 1000.0000 mg | ORAL_TABLET | Freq: Every day | ORAL | Status: DC

## 2017-10-02 MED ORDER — VITAMIN D3 25 MCG (1000 UNIT) PO TABS: 1000.0000 [IU] | ORAL_TABLET | Freq: Every day | ORAL | Status: DC

## 2017-10-02 MED ORDER — VITAMIN B-12 1000 MCG PO TABS: 1000.0000 ug | ORAL_TABLET | Freq: Every day | ORAL | Status: DC

## 2017-10-02 MED ORDER — SIMVASTATIN 20 MG PO TABS: 20.0000 mg | ORAL_TABLET | Freq: Every day | ORAL | Status: DC

## 2017-10-02 MED ORDER — METHOCARBAMOL 500 MG PO TABS
500.0000 mg | ORAL_TABLET | Freq: Once | ORAL | Status: AC
  Administered 2017-10-02: 500 mg via ORAL
  Filled 2017-10-02: qty 1

## 2017-10-02 MED ORDER — HYDROXYZINE HCL 25 MG PO TABS
25.0000 mg | ORAL_TABLET | Freq: Four times a day (QID) | ORAL | Status: DC | PRN
  Administered 2017-10-02 – 2017-10-04 (×3): 25 mg via ORAL
  Filled 2017-10-02 (×3): qty 1

## 2017-10-02 MED ORDER — CLONIDINE HCL 0.1 MG PO TABS
0.1000 mg | ORAL_TABLET | Freq: Four times a day (QID) | ORAL | Status: AC
  Administered 2017-10-02 – 2017-10-04 (×5): 0.1 mg via ORAL
  Filled 2017-10-02 (×8): qty 1

## 2017-10-02 MED ORDER — CHLORDIAZEPOXIDE HCL 25 MG PO CAPS: 25.0000 mg | ORAL_CAPSULE | Freq: Every day | ORAL | Status: DC

## 2017-10-02 MED ORDER — ALUM & MAG HYDROXIDE-SIMETH 200-200-20 MG/5ML PO SUSP: 30.0000 mL | ORAL | Status: DC | PRN

## 2017-10-02 MED ORDER — VITAMIN B-12 1000 MCG PO TABS
1000.0000 ug | ORAL_TABLET | Freq: Every day | ORAL | Status: DC
  Administered 2017-10-03 – 2017-10-05 (×3): 1000 ug via ORAL
  Filled 2017-10-02 (×6): qty 1

## 2017-10-02 NOTE — ED Notes (Addendum)
Pt belongings included: tennis shoes, pants, navy shirt, button up shirt, bra, along with Pt's medications

## 2017-10-02 NOTE — Tx Team (Signed)
Initial Treatment Plan 10/02/2017 6:46 PM Meghan Schwartz ZWC:[id number]    PATIENT STRESSORS: Marital or family conflict Medication change or noncompliance Substance abuse   PATIENT STRENGTHS: Capable of independent living Communication skills Motivation for treatment/growth Supportive family/friends   PATIENT IDENTIFIED PROBLEMS: "I want to leave as soon as possible"  "get an out-patient therapist"  Substance Abuse  Suicide Risk  Ineffective Coping             DISCHARGE CRITERIA:  Ability to meet basic life and health needs Adequate post-discharge living arrangements Improved stabilization in mood, thinking, and/or behavior Medical problems require only outpatient monitoring Motivation to continue treatment in a less acute level of care Need for constant or close observation no longer present Reduction of life-threatening or endangering symptoms to within safe limits Safe-care adequate arrangements made Verbal commitment to aftercare and medication compliance Withdrawal symptoms are absent or subacute and managed without 24-hour nursing intervention  PRELIMINARY DISCHARGE PLAN: Outpatient therapy  PATIENT/FAMILY INVOLVEMENT: This treatment plan has been presented to and reviewed with the patient, Meghan Schwartz.  The patient and family have been given the opportunity to ask questions and make suggestions.  Ferrel Logan, RN 10/02/2017, 6:46 PM
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
## 2017-10-02 NOTE — ED Notes (Signed)
Spoke with [facility] poison Control: -Mag level -maintain potassium and magnesium at high normal -repeat salicylate level -repeat EKG in 3 hours-QTc is borderline

## 2017-10-02 NOTE — ED Notes (Signed)
Pt oriented to room and unit.  Pt is very anxious, restless , and tearful.  Pt keeps stating "I want to leave,  I cant stay "  Comfort measures were given including snack and drink and warm blankets.  Pt was assured of her safety.  15 minute checks and video monitoring in place.

## 2017-10-02 NOTE — Progress Notes (Signed)
PHARMACIST - PHYSICIAN ORDER COMMUNICATION  CONCERNING: P&T Medication Policy on Herbal Medications  DESCRIPTION:  This patient's order for:  Biotin  has been noted.  This product(s) is classified as an "herbal" or natural product. Due to a lack of definitive safety studies or FDA approval, nonstandard manufacturing practices, plus the potential risk of unknown drug-drug interactions while on inpatient medications, the Pharmacy and Therapeutics Committee does not permit the use of "herbal" or natural products of this type within [facility].   ACTION TAKEN: The pharmacy department is unable to verify this order at this time and your patient has been informed of this safety policy. Please reevaluate patient's clinical condition at discharge and address if the herbal or natural product(s) should be resumed at that time.  Bernadene Person, PharmD, BCPS [phone] 10/02/2017, 9:49 AM
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
## 2017-10-02 NOTE — ED Notes (Signed)
Meghan Schwartz-Pt's husband [phone]

## 2017-10-02 NOTE — BH Assessment (Signed)
[facility] Assessment Progress Note  Per Juanetta Beets, DO, this pt requires psychiatric hospitalization.  Malva Limes, RN, [facility] has assigned pt to [facility] Rm 303-1; [facility] will be ready to receive pt at 14:30.  Pt presents under IVC initiated by pt's son, and upheld by EDP Devoria Albe, MD, and IVC documents have been faxed to [facility].  Pt's nurse, Kendal Hymen, has been notified, and agrees to call report to [phone].  Pt is to be transported via [employer].   Doylene Canning, [place] [facility] Coordinator [phone]

## 2017-10-02 NOTE — ED Notes (Signed)
Bed: [facility] Expected date:  Expected time:  Means of arrival:  Comments: Hold for 17

## 2017-10-02 NOTE — ED Notes (Signed)
Woke patient for transport.  Pt would not speak to [place] but she was in no distress at discharge.  All belongings were sent with patient.
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
## 2017-10-02 NOTE — ED Notes (Signed)
Bed: WA17 Expected date:  Expected time:  Means of arrival:  Comments: RES A 

## 2017-10-02 NOTE — Progress Notes (Signed)
Patient has been sleeping soundly since the start of this writer's shift. Patient was medicated for extreme anxiety at shift change as well as for complaints of pain. Patient's RR WNL, even and unlabored. Color is appropriate. NAD, no signs/symptoms of withdrawal. Patient was difficult to arouse for VS and quickly returned to sleep after they were obtained. VSS. Will continue to monitor closely throughout the night. Patient is safe on level III obs.

## 2017-10-02 NOTE — ED Notes (Signed)
[place] [employer] served Pt with IVC papers. Dr. Lynelle Doctor notified

## 2017-10-02 NOTE — Progress Notes (Signed)
Pt new to the unit. Pt did not attend wrap-up group.

## 2017-10-03 DIAGNOSIS — F332 Major depressive disorder, recurrent severe without psychotic features: Principal | ICD-10-CM

## 2017-10-03 DIAGNOSIS — F1320 Sedative, hypnotic or anxiolytic dependence, uncomplicated: Secondary | ICD-10-CM

## 2017-10-03 MED ORDER — HYDROXYZINE HCL 50 MG PO TABS
50.0000 mg | ORAL_TABLET | Freq: Once | ORAL | Status: AC
  Administered 2017-10-03: 50 mg via ORAL
  Filled 2017-10-03 (×2): qty 1

## 2017-10-03 MED ORDER — CITALOPRAM HYDROBROMIDE 20 MG PO TABS
20.0000 mg | ORAL_TABLET | Freq: Every day | ORAL | Status: DC
  Administered 2017-10-04: 20 mg via ORAL
  Filled 2017-10-03 (×2): qty 1

## 2017-10-03 NOTE — Plan of Care (Signed)
D: Pt denies SI/HI/AVH. Pt is pleasant and cooperative. Pt stated she was anxious and having issues sleeping. Pt stated this place scares her and she feels uneasy at times . Pt visible in the dayroom some this evening.1:1 time spent with pt and non-pharmacological methods of anxiety reduction .  A: Pt was offered support and encouragement. Pt was given scheduled medications. Pt was encourage to attend groups. Q 15 minute checks were done for safety.  R:Pt attends groups and interacts well with peers and staff. Pt is taking medication. Pt has no complaints.Pt receptive to treatment and safety maintained on unit.   Problem: Education: Goal: Emotional status will improve Outcome: Progressing   Problem: Education: Goal: Mental status will improve Outcome: Progressing   Problem: Activity: Goal: Interest or engagement in activities will improve Outcome: Progressing   Problem: Activity: Goal: Sleeping patterns will improve Outcome: Progressing

## 2017-10-03 NOTE — Tx Team (Signed)
Interdisciplinary Treatment and Diagnostic Plan Update  10/03/2017 Time of Session: 10:10am Meghan Schwartz MRN: [id number]  Principal Diagnosis: <principal problem not specified>  Secondary Diagnoses: Active Problems:   MDD (major depressive disorder), recurrent severe, without psychosis ([place])   Current Medications:  Current Facility-Administered Medications  Medication Dose Route Frequency Provider Last Rate Last Dose  . acetaminophen (TYLENOL) tablet 650 mg  650 mg Oral Q6H PRN Ethelene Hal, NP   650 mg at 10/03/17 0053  . alum & mag hydroxide-simeth (MAALOX/MYLANTA) 200-200-20 MG/5ML suspension 30 mL  30 mL Oral Q4H PRN Ethelene Hal, NP      . chlordiazePOXIDE (LIBRIUM) capsule 25 mg  25 mg Oral Q6H PRN Laverle Hobby, PA-C      . chlordiazePOXIDE (LIBRIUM) capsule 25 mg  25 mg Oral QID Patriciaann Clan E, PA-C   25 mg at 10/03/17 0800   Followed by  . [START ON 10/04/2017] chlordiazePOXIDE (LIBRIUM) capsule 25 mg  25 mg Oral TID Laverle Hobby, PA-C       Followed by  . [START ON 10/05/2017] chlordiazePOXIDE (LIBRIUM) capsule 25 mg  25 mg Oral BH-qamhs Simon, Spencer E, PA-C       Followed by  . [START ON 10/07/2017] chlordiazePOXIDE (LIBRIUM) capsule 25 mg  25 mg Oral Daily Simon, Spencer E, PA-C      . citalopram (CELEXA) tablet 40 mg  40 mg Oral Daily Ethelene Hal, NP   40 mg at 10/03/17 0755  . cloNIDine (CATAPRES) tablet 0.1 mg  0.1 mg Oral QID Ethelene Hal, NP   0.1 mg at 10/03/17 0754   Followed by  . [START ON 10/04/2017] cloNIDine (CATAPRES) tablet 0.1 mg  0.1 mg Oral BH-qamhs Ethelene Hal, NP       Followed by  . [START ON 10/07/2017] cloNIDine (CATAPRES) tablet 0.1 mg  0.1 mg Oral QAC breakfast Ethelene Hal, NP      . dicyclomine (BENTYL) tablet 20 mg  20 mg Oral Q6H PRN Ethelene Hal, NP   20 mg at 10/02/17 1850  . etanercept (ENBREL) 50 MG/ML injection 50 mg  50 mg Subcutaneous Q Fri Parks, Laurie Britton, NP       . hydroxychloroquine (PLAQUENIL) tablet 200 mg  200 mg Oral Daily Ethelene Hal, NP   200 mg at 10/03/17 0755  . hydrOXYzine (ATARAX/VISTARIL) tablet 25 mg  25 mg Oral Q6H PRN Ethelene Hal, NP   25 mg at 10/02/17 1850  . loperamide (IMODIUM) capsule 2-4 mg  2-4 mg Oral PRN Ethelene Hal, NP      . magnesium hydroxide (MILK OF MAGNESIA) suspension 30 mL  30 mL Oral Daily PRN Ethelene Hal, NP      . multivitamin with minerals tablet 1 tablet  1 tablet Oral Daily Laverle Hobby, PA-C   1 tablet at 10/03/17 0755  . nicotine (NICODERM CQ - dosed in mg/24 hours) patch 21 mg  21 mg Transdermal Daily Ethelene Hal, NP      . nicotine polacrilex (NICORETTE) gum 2 mg  2 mg Oral PRN Cobos, Myer Peer, MD   2 mg at 10/03/17 0756  . ondansetron (ZOFRAN-ODT) disintegrating tablet 4 mg  4 mg Oral Q6H PRN Ethelene Hal, NP      . simvastatin (ZOCOR) tablet 20 mg  20 mg Oral QHS Ethelene Hal, NP   20 mg at 10/02/17 2350  . thiamine (B-1) injection 100 mg  100 mg Intramuscular Once Laverle Hobby, PA-C      . thiamine (VITAMIN B-1) tablet 100 mg  100 mg Oral Daily Patriciaann Clan E, PA-C   100 mg at 10/03/17 0756  . traZODone (DESYREL) tablet 50 mg  50 mg Oral QHS PRN Ethelene Hal, NP   50 mg at 10/03/17 0053  . vitamin B-12 (CYANOCOBALAMIN) tablet 1,000 mcg  1,000 mcg Oral Daily Ethelene Hal, NP   1,000 mcg at 10/03/17 0755  . vitamin C (ASCORBIC ACID) tablet 1,000 mg  1,000 mg Oral Daily Ethelene Hal, NP   1,000 mg at 10/03/17 1660   PTA Medications: Medications Prior to Admission  Medication Sig Dispense Refill Last Dose  . amphetamine-dextroamphetamine (ADDERALL XR) 15 MG 24 hr capsule Take 15 mg by mouth 2 (two) times daily as needed. Take to help focus   Past Month at Unknown time  . Ascorbic Acid (VITAMIN C) 1000 MG tablet Take 1,000 mg by mouth daily.   10/01/2017 at Unknown time  . BIOTIN PO Take 1 tablet by mouth  daily.   Past Week at Unknown time  . citalopram (CELEXA) 40 MG tablet Take 40 mg by mouth daily.  0 Past Week at Unknown time  . clonazePAM (KLONOPIN) 1 MG tablet Take 1-2 mg by mouth at bedtime as needed for anxiety.   Past Week at Unknown time  . estradiol (VIVELLE-DOT) 0.075 MG/24HR Place 1 patch onto the skin 2 (two) times a week. Tuesday and Friday   Past Week at Unknown time  . etanercept (ENBREL) 50 MG/ML injection Inject 50 mg into the skin every Friday. Self injection once per week    Past Week at Unknown time  . HYDROcodone-acetaminophen (NORCO) 10-325 MG tablet Take 1-2 tablets by mouth every 6 (six) hours as needed for severe pain.   Past Month at Unknown time  . hydroxychloroquine (PLAQUENIL) 200 MG tablet Take 200 mg by mouth daily.   Past Week at Unknown time  . ibuprofen (ADVIL,MOTRIN) 200 MG tablet Take 400 mg by mouth every 6 (six) hours as needed for moderate pain.   10/01/2017 at Unknown time  . Multiple Vitamins-Minerals (CENTRUM SILVER ULTRA WOMENS PO) Take 1 tablet by mouth daily.   Past Week at Unknown time  . simvastatin (ZOCOR) 20 MG tablet Take 20 mg by mouth daily.  0 Past Week at Unknown time  . traMADol (ULTRAM) 50 MG tablet Take 50 mg by mouth every 6 (six) hours as needed for moderate pain.   0 Past Month at Unknown time  . vitamin B-12 (CYANOCOBALAMIN) 1000 MCG tablet Take 1,000 mcg by mouth daily.   10/01/2017 at Unknown time  . Vitamin D, Cholecalciferol, 1000 UNITS CAPS Take 1,000 mg by mouth daily.    Past Week at Unknown time    Patient Stressors: Marital or family conflict Medication change or noncompliance Substance abuse  Patient Strengths: Capable of independent living Agricultural engineer for treatment/growth Supportive family/friends  Treatment Modalities: Medication Management, Group therapy, Case management,  1 to 1 session with clinician, Psychoeducation, Recreational therapy.   Physician Treatment Plan for Primary Diagnosis:  <principal problem not specified> Long Term Goal(s):     Short Term Goals:    Medication Management: Evaluate patient's response, side effects, and tolerance of medication regimen.  Therapeutic Interventions: 1 to 1 sessions, Unit Group sessions and Medication administration.  Evaluation of Outcomes: Not Met  Physician Treatment Plan for Secondary Diagnosis: Active Problems:   MDD (  major depressive disorder), recurrent severe, without psychosis ([place])  Long Term Goal(s):     Short Term Goals:       Medication Management: Evaluate patient's response, side effects, and tolerance of medication regimen.  Therapeutic Interventions: 1 to 1 sessions, Unit Group sessions and Medication administration.  Evaluation of Outcomes: Not Met   RN Treatment Plan for Primary Diagnosis: <principal problem not specified> Long Term Goal(s): Knowledge of disease and therapeutic regimen to maintain health will improve  Short Term Goals: Ability to verbalize feelings will improve, Ability to disclose and discuss suicidal ideas, Ability to identify and develop effective coping behaviors will improve and Compliance with prescribed medications will improve  Medication Management: RN will administer medications as ordered by provider, will assess and evaluate patient's response and provide education to patient for prescribed medication. RN will report any adverse and/or side effects to prescribing provider.  Therapeutic Interventions: 1 on 1 counseling sessions, Psychoeducation, Medication administration, Evaluate responses to treatment, Monitor vital signs and CBGs as ordered, Perform/monitor CIWA, COWS, AIMS and Fall Risk screenings as ordered, Perform wound care treatments as ordered.  Evaluation of Outcomes: Not Met   LCSW Treatment Plan for Primary Diagnosis: <principal problem not specified> Long Term Goal(s): Safe transition to appropriate next level of care at discharge, Engage patient in  therapeutic group addressing interpersonal concerns.  Short Term Goals: Engage patient in aftercare planning with referrals and resources  Therapeutic Interventions: Assess for all discharge needs, 1 to 1 time with Social worker, Explore available resources and support systems, Assess for adequacy in community support network, Educate family and significant other(s) on suicide prevention, Complete Psychosocial Assessment, Interpersonal group therapy.  Evaluation of Outcomes: Not Met   Progress in Treatment: Attending groups: No. Participating in groups: No. Taking medication as prescribed: Yes. Toleration medication: Yes. Family/Significant other contact made: Yes, individual(s) contacted:  patient's daughter or son Patient understands diagnosis: Yes. Discussing patient identified problems/goals with staff: Yes. Medical problems stabilized or resolved: Yes. Denies suicidal/homicidal ideation: Yes. Issues/concerns per patient self-inventory: No. Other:   New problem(s) identified: None   New Short Term/Long Term Goal(s):medication stabilization, elimination of SI thoughts, development of comprehensive mental wellness plan.    Patient Goals: Get an outpatient therapist    Discharge Plan or Barriers: CSW will continue to assess for appropriate referrals and discharge planning.   Reason for Continuation of Hospitalization: Depression Medication stabilization  Estimated Length of Stay: 3-5 days   Attendees: Patient: 10/03/2017 8:35 AM  Physician: Dr. Neita Garnet, MD 10/03/2017 8:35 AM  Nursing: Danae Chen, RN 10/03/2017 8:35 AM  RN Care Manager:X 10/03/2017 8:35 AM  Social Worker: Radonna Ricker, [place] 10/03/2017 8:35 AM  Recreational Therapist: Rhunette Croft 10/03/2017 8:35 AM  Other: X 10/03/2017 8:35 AM  Other: X 10/03/2017 8:35 AM  Other:X 10/03/2017 8:35 AM    Scribe for Treatment Team: Marylee Floras, [place] 10/03/2017 8:35 AM

## 2017-10-03 NOTE — Progress Notes (Signed)
Patient attended the evening A.A.meeting and was appropriate. She was initially hesitant to attend the meeting since she had some previous negative experiences.
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
## 2017-10-03 NOTE — Progress Notes (Signed)
D: Patient denies SI, HI or AVH this morning. Patient presents as anxious and depressed but otherwise calm and cooperative.  Pt. States her goal for today is to understand her prognosis and outlook so that she can go home.  Pt. Denies any physical complaints and does not exhibit withdrawal symptoms, VSS.  A: Patient given emotional support from RN. Patient encouraged to come to staff with concerns and/or questions. Patient's medication routine continued. Patient's orders and plan of care reviewed.   R: Patient remains appropriate and cooperative. Will continue to monitor patient q15 minutes for safety.

## 2017-10-03 NOTE — Progress Notes (Signed)
D: Patient awakened shortly before midnight. Asking for medication for a headache and to help her return to sleep. Patient was medicated with her scheduled librium as she indicates she takes "1-2 klonopin every night when I go to bed." Clonidine held as patient does not display withdrawal at this time and VSS. Patient's affect anxious, mood congruent.  Reporting a headache that she rates at a 5/10. No other physical complaints.   A: Medicated per orders, prn tylenol and trazadone given. Medication education provided. Level III obs in place for safety. Emotional support offered. Patient encouraged to complete Suicide Safety Plan before discharge. Encouraged to attend and participate in unit programming.  Fall prevention plan in place and reviewed with patient as pt is a high fall risk.   R: Patient verbalizes understanding of POC, falls prevention education. On reassess, patient was asleep. Patient denies SI/HI/AVH and remains safe on level III obs. Will continue to monitor throughout the night.

## 2017-10-03 NOTE — H&P (Addendum)
Psychiatric Admission Assessment Adult  Patient Identification: Meghan Schwartz MRN:  [id number] Date of Evaluation:  10/03/2017 Chief Complaint:  " I am king of angry that I am here" Principal Diagnosis:  Depression, Unspecificied, consider BZD, Opiate Dependencies  Diagnosis:   Patient Active Problem List   Diagnosis Date Noted  . MDD (major depressive disorder), recurrent severe, without psychosis ([place]) [F33.2] 10/02/2017  . CTS (carpal tunnel syndrome) [G56.00] 06/08/2014   History of Present Illness: 67 year old married , retired female, brought in to ED on 8/7 via EMS. States she thinks her husband called EMS. Report is that patient had been found lying at the top of her stairs on arrival, initially confused,  and that there were 30 Hydrocodone, 14 Klonopin, and 24 Tramadol tablets missing . As per ED notes, husband reported that she had fallen , crumpled as she went to the bathroom. Patient states she fell because she hit her head on a door that her husband slammed . Husband also reported that patient has made suicidal statements . She acknowledges  she has been depressed, sad recently, which she attributes in part to her adult son's marriage failing , and some increased financial tension after her husband retired. She states that she suffers from chronic pain..  At this time patient denies suicidal ideations and categorically denies recent event was a suicidal attempt. States she normally takes medications as prescribed but does acknowledge she has been taking more medications than prescribed at times , mostly Tramadol, which she states is to manage pain.  Currently patient denies neuro-vegetative symptoms of depression. Admission UDS positive for BZDs, not positive for opiates, admission BAL negative. Of note, Head CT ( 8/8) reported as no acute intracranial abnormality. Associated Signs/Symptoms: Depression Symptoms:  Denies persistent sadness or depression, denies changes in appetite,  sleep, energy level (Hypo) Manic Symptoms:  Does not present with or endorse  Anxiety Symptoms:  Denies increased anxiety Psychotic Symptoms:  Denies  PTSD Symptoms: Denies  Total Time spent with patient: 45 minutes  Past Psychiatric History:one prior psychiatric admission in 2012, at which time she was diagnosed with opiate dependence, was treated with opiate detox protocol. At the time was discharged on Celexa and on Effexor . States she has never attempted suicide.  Is the patient at risk to self? Yes.    Has the patient been a risk to self in the past 6 months? No.  Has the patient been a risk to self within the distant past? No.  Is the patient a risk to others? No.  Has the patient been a risk to others in the past 6 months? No.  Has the patient been a risk to others within the distant past? No.   Prior Inpatient Therapy:  as above  Prior Outpatient Therapy:  states psychiatric medications are prescribed by PCP  Alcohol Screening: 1. How often do you have a drink containing alcohol?: Never 2. How many drinks containing alcohol do you have on a typical day when you are drinking?: 1 or 2 3. How often do you have six or more drinks on one occasion?: Never AUDIT-C Score: 0 9. Have you or someone else been injured as a result of your drinking?: No 10. Has a relative or friend or a doctor or another health worker been concerned about your drinking or suggested you cut down?: No Alcohol Use Disorder Identification Test Final Score (AUDIT): 0 Intervention/Follow-up: AUDIT Score <7 follow-up not indicated Substance Abuse History in the last 12  months: denies alcohol or drug abuse. She was being prescribed Klonopin, Norco, Tramadol prior to admission- reports she did sometimes take more Tramadol than prescribed   Consequences of Substance Abuse: Denies  Previous Psychotropic Medications: states she has been taking Celexa ( 40 mgrs ) for many years . Reports she was also prescribed  Klonopin 1-2 mgrs QHS.  In the past was tried on Wellbutrin and Celexa Psychological Evaluations:No Past Medical History: she reports history of Rheumatoid Arthritis. Reports she was being prescribed Tramadol , up to 8 tablets per day, for chronic pain.  Past Medical History:  Diagnosis Date  . Anxiety   . High cholesterol   . RA (rheumatoid arthritis) ([place])     Past Surgical History:  Procedure Laterality Date  . [place]  . FOOT SURGERY Left 2008  . VAGINAL HYSTERECTOMY  2001   Family History: parents deceased , father died from Parkinsons complications, mother died from breast cancer, has one sister  Family History  Problem Relation Age of Onset  . Thyroid disease Mother   . Heart attack Paternal Grandfather   . Stroke Paternal Grandmother   . Arthritis Maternal Grandmother    Family Psychiatric  History: denies history of psychiatric illness in family , no suicides in family, no history of alcohol use disorder in family Tobacco Screening: smokes 1/2 PPD Social History: 67 year old married female, two adult children, retired Automotive engineer, lives with husband Social History   Substance and Sexual Activity  Alcohol Use No  . Alcohol/week: 0.0 standard drinks     Social History   Substance and Sexual Activity  Drug Use No    Additional Social History: Marital status: Married Number of Years Married: 54 What types of issues is patient dealing with in the relationship?: Conflicts due to mental health issues (depression) Additional relationship information: No Are you sexually active?: No What is your sexual orientation?: Heterosexual  Has your sexual activity been affected by drugs, alcohol, medication, or emotional stress?: No  Does patient have children?: Yes How many children?: 2 How is patient's relationship with their children?: Patient reports she is very close with her two adult sons. States she is very close to her grandchildren.     Allergies:   Allergies  Allergen Reactions  . Erythromycin Nausea Only   Lab Results:  Results for orders placed or performed during the hospital encounter of 10/01/17 (from the past 48 hour(s))  Comprehensive metabolic panel     Status: Abnormal   Collection Time: 10/01/17 11:29 PM  Result Value Ref Range   Sodium 147 (H) 135 - 145 mmol/L   Potassium 3.8 3.5 - 5.1 mmol/L   Chloride 110 98 - 111 mmol/L   CO2 28 22 - 32 mmol/L   Glucose, Bld 92 70 - 99 mg/dL   BUN 18 8 - 23 mg/dL   Creatinine, Ser 0.49 0.44 - 1.00 mg/dL   Calcium 9.2 8.9 - 10.3 mg/dL   Total Protein 6.4 (L) 6.5 - 8.1 g/dL   Albumin 3.4 (L) 3.5 - 5.0 g/dL   AST 19 15 - 41 U/L   ALT 17 0 - 44 U/L   Alkaline Phosphatase 65 38 - 126 U/L   Total Bilirubin 0.3 0.3 - 1.2 mg/dL   GFR calc non Af Amer >60 >60 mL/min   GFR calc Af Amer >60 >60 mL/min    Comment: (NOTE) The eGFR has been calculated using the CKD EPI equation. This calculation has  not been validated in all clinical situations. eGFR's persistently <60 mL/min signify possible Chronic Kidney Disease.    Anion gap 9 5 - 15    Comment: Performed at [facility], [place] [address] [place], [place] [address]  Ethanol     Status: None   Collection Time: 10/01/17 11:29 PM  Result Value Ref Range   Alcohol, Ethyl (B) <10 <10 mg/dL    Comment: (NOTE) Lowest detectable limit for serum alcohol is 10 mg/dL. For medical purposes only. Performed at [facility], [place] [address], [place], [place] [address]   Salicylate level     Status: None   Collection Time: 10/01/17 11:29 PM  Result Value Ref Range   Salicylate Lvl 94.7 2.8 - 30.0 mg/dL    Comment: Performed at [facility], [place] [address], [place], [place] [address]  Acetaminophen level     Status: Abnormal   Collection Time: 10/01/17 11:29 PM  Result Value Ref Range   Acetaminophen (Tylenol), Serum <10 (L) 10 - 30 ug/mL    Comment:  (NOTE) Therapeutic concentrations vary significantly. A range of 10-30 ug/mL  may be an effective concentration for many patients. However, some  are best treated at concentrations outside of this range. Acetaminophen concentrations >150 ug/mL at 4 hours after ingestion  and >50 ug/mL at 12 hours after ingestion are often associated with  toxic reactions. Performed at [facility], [place] [address], [place], [place] [address]   cbc     Status: None   Collection Time: 10/01/17 11:29 PM  Result Value Ref Range   WBC 5.2 4.0 - 10.5 K/uL   RBC 4.90 3.87 - 5.11 MIL/uL   Hemoglobin 14.4 12.0 - 15.0 g/dL   HCT 43.3 36.0 - 46.0 %   MCV 88.4 78.0 - 100.0 fL   MCH 29.4 26.0 - 34.0 pg   MCHC 33.3 30.0 - 36.0 g/dL   RDW 13.7 11.5 - 15.5 %   Platelets 304 150 - 400 K/uL    Comment: Performed at [facility], [place] [address] [place], [place] [address]  Rapid urine drug screen (hospital performed)     Status: Abnormal   Collection Time: 10/02/17 12:11 AM  Result Value Ref Range   Opiates NONE DETECTED NONE DETECTED   Cocaine NONE DETECTED NONE DETECTED   Benzodiazepines POSITIVE (A) NONE DETECTED   Amphetamines NONE DETECTED NONE DETECTED   Tetrahydrocannabinol NONE DETECTED NONE DETECTED   Barbiturates NONE DETECTED NONE DETECTED    Comment: (NOTE) DRUG SCREEN FOR MEDICAL PURPOSES ONLY.  IF CONFIRMATION IS NEEDED FOR ANY PURPOSE, NOTIFY LAB WITHIN 5 DAYS. LOWEST DETECTABLE LIMITS FOR URINE DRUG SCREEN Drug Class                     Cutoff (ng/mL) Amphetamine and metabolites    1000 Barbiturate and metabolites    200 Benzodiazepine                 751 Tricyclics and metabolites     300 Opiates and metabolites        300 Cocaine and metabolites        300 THC                            50 Performed at [facility], [place] [address], [place], [place] [address]   Magnesium     Status: None  Collection Time: 10/02/17  2:22 AM   Result Value Ref Range   Magnesium 2.2 1.7 - 2.4 mg/dL    Comment: Performed at [facility], [place] [address] [place], [place] [address]  Salicylate level     Status: None   Collection Time: 10/02/17  2:23 AM  Result Value Ref Range   Salicylate Lvl 15.7 2.8 - 30.0 mg/dL    Comment: Performed at [facility], [place] [address], [place], [place] [address]  Salicylate level     Status: None   Collection Time: 10/02/17  5:49 AM  Result Value Ref Range   Salicylate Lvl 55.9 2.8 - 30.0 mg/dL    Comment: Performed at [facility], [place] [address], [place], [place] [address]    Blood Alcohol level:  Lab Results  Component Value Date   ETH <10 10/01/2017   [facility]  06/23/2010    <5 REPEATED TO VERIFY        LOWEST DETECTABLE LIMIT FOR SERUM ALCOHOL IS 5 mg/dL FOR MEDICAL PURPOSES ONLY    Metabolic Disorder Labs:  No results found for: HGBA1C, MPG No results found for: PROLACTIN No results found for: CHOL, TRIG, HDL, CHOLHDL, VLDL, LDLCALC  Current Medications: Current Facility-Administered Medications  Medication Dose Route Frequency Provider Last Rate Last Dose  . acetaminophen (TYLENOL) tablet 650 mg  650 mg Oral Q6H PRN Ethelene Hal, NP   650 mg at 10/03/17 0053  . alum & mag hydroxide-simeth (MAALOX/MYLANTA) 200-200-20 MG/5ML suspension 30 mL  30 mL Oral Q4H PRN Ethelene Hal, NP      . chlordiazePOXIDE (LIBRIUM) capsule 25 mg  25 mg Oral Q6H PRN Laverle Hobby, PA-C      . chlordiazePOXIDE (LIBRIUM) capsule 25 mg  25 mg Oral QID Patriciaann Clan E, PA-C   25 mg at 10/03/17 1214   Followed by  . [START ON 10/04/2017] chlordiazePOXIDE (LIBRIUM) capsule 25 mg  25 mg Oral TID Laverle Hobby, PA-C       Followed by  . [START ON 10/05/2017] chlordiazePOXIDE (LIBRIUM) capsule 25 mg  25 mg Oral BH-qamhs Simon, Spencer E, PA-C       Followed by  . [START ON 10/07/2017] chlordiazePOXIDE (LIBRIUM) capsule 25 mg  25 mg  Oral Daily Simon, Spencer E, PA-C      . citalopram (CELEXA) tablet 40 mg  40 mg Oral Daily Ethelene Hal, NP   40 mg at 10/03/17 0755  . cloNIDine (CATAPRES) tablet 0.1 mg  0.1 mg Oral QID Ethelene Hal, NP   0.1 mg at 10/03/17 1213   Followed by  . [START ON 10/04/2017] cloNIDine (CATAPRES) tablet 0.1 mg  0.1 mg Oral BH-qamhs Ethelene Hal, NP       Followed by  . [START ON 10/07/2017] cloNIDine (CATAPRES) tablet 0.1 mg  0.1 mg Oral QAC breakfast Ethelene Hal, NP      . dicyclomine (BENTYL) tablet 20 mg  20 mg Oral Q6H PRN Ethelene Hal, NP   20 mg at 10/02/17 1850  . etanercept (ENBREL) 50 MG/ML injection 50 mg  50 mg Subcutaneous Q Fri Parks, Laurie Britton, NP      . hydroxychloroquine (PLAQUENIL) tablet 200 mg  200 mg Oral Daily Ethelene Hal, NP   200 mg at 10/03/17 0755  . hydrOXYzine (ATARAX/VISTARIL) tablet 25 mg  25 mg Oral Q6H PRN Ethelene Hal, NP   25 mg at 10/02/17 1850  .  loperamide (IMODIUM) capsule 2-4 mg  2-4 mg Oral PRN Ethelene Hal, NP      . magnesium hydroxide (MILK OF MAGNESIA) suspension 30 mL  30 mL Oral Daily PRN Ethelene Hal, NP      . multivitamin with minerals tablet 1 tablet  1 tablet Oral Daily Laverle Hobby, PA-C   1 tablet at 10/03/17 0755  . nicotine (NICODERM CQ - dosed in mg/24 hours) patch 21 mg  21 mg Transdermal Daily Ethelene Hal, NP      . nicotine polacrilex (NICORETTE) gum 2 mg  2 mg Oral PRN Cobos, Myer Peer, MD   2 mg at 10/03/17 1047  . ondansetron (ZOFRAN-ODT) disintegrating tablet 4 mg  4 mg Oral Q6H PRN Ethelene Hal, NP      . simvastatin (ZOCOR) tablet 20 mg  20 mg Oral QHS Ethelene Hal, NP   20 mg at 10/02/17 2350  . thiamine (B-1) injection 100 mg  100 mg Intramuscular Once Patriciaann Clan E, PA-C      . thiamine (VITAMIN B-1) tablet 100 mg  100 mg Oral Daily Patriciaann Clan E, PA-C   100 mg at 10/03/17 0756  . traZODone (DESYREL) tablet 50 mg   50 mg Oral QHS PRN Ethelene Hal, NP   50 mg at 10/03/17 0053  . vitamin B-12 (CYANOCOBALAMIN) tablet 1,000 mcg  1,000 mcg Oral Daily Ethelene Hal, NP   1,000 mcg at 10/03/17 0755  . vitamin C (ASCORBIC ACID) tablet 1,000 mg  1,000 mg Oral Daily Ethelene Hal, NP   1,000 mg at 10/03/17 8882   PTA Medications: Medications Prior to Admission  Medication Sig Dispense Refill Last Dose  . amphetamine-dextroamphetamine (ADDERALL XR) 15 MG 24 hr capsule Take 15 mg by mouth 2 (two) times daily as needed. Take to help focus   Past Month at Unknown time  . Ascorbic Acid (VITAMIN C) 1000 MG tablet Take 1,000 mg by mouth daily.   10/01/2017 at Unknown time  . BIOTIN PO Take 1 tablet by mouth daily.   Past Week at Unknown time  . citalopram (CELEXA) 40 MG tablet Take 40 mg by mouth daily.  0 Past Week at Unknown time  . clonazePAM (KLONOPIN) 1 MG tablet Take 1-2 mg by mouth at bedtime as needed for anxiety.   Past Week at Unknown time  . estradiol (VIVELLE-DOT) 0.075 MG/24HR Place 1 patch onto the skin 2 (two) times a week. Tuesday and Friday   Past Week at Unknown time  . etanercept (ENBREL) 50 MG/ML injection Inject 50 mg into the skin every Friday. Self injection once per week    Past Week at Unknown time  . HYDROcodone-acetaminophen (NORCO) 10-325 MG tablet Take 1-2 tablets by mouth every 6 (six) hours as needed for severe pain.   Past Month at Unknown time  . hydroxychloroquine (PLAQUENIL) 200 MG tablet Take 200 mg by mouth daily.   Past Week at Unknown time  . ibuprofen (ADVIL,MOTRIN) 200 MG tablet Take 400 mg by mouth every 6 (six) hours as needed for moderate pain.   10/01/2017 at Unknown time  . Multiple Vitamins-Minerals (CENTRUM SILVER ULTRA WOMENS PO) Take 1 tablet by mouth daily.   Past Week at Unknown time  . simvastatin (ZOCOR) 20 MG tablet Take 20 mg by mouth daily.  0 Past Week at Unknown time  . traMADol (ULTRAM) 50 MG tablet Take 50 mg by mouth every 6 (six) hours as  needed  for moderate pain.   0 Past Month at Unknown time  . vitamin B-12 (CYANOCOBALAMIN) 1000 MCG tablet Take 1,000 mcg by mouth daily.   10/01/2017 at Unknown time  . Vitamin D, Cholecalciferol, 1000 UNITS CAPS Take 1,000 mg by mouth daily.    Past Week at Unknown time    Musculoskeletal: Strength & Muscle Tone: within normal limits Gait & Station: normal Patient leans: N/A  Psychiatric Specialty Exam: Physical Exam  Review of Systems  Constitutional: Negative.   HENT: Negative.   Eyes: Negative.   Respiratory: Negative.   Cardiovascular: Negative.   Gastrointestinal: Negative for diarrhea, nausea and vomiting.  Genitourinary: Negative.   Musculoskeletal:       Reports chronic pain, mainly on knees and R foot   Skin: Negative.   Neurological: Negative for seizures and headaches.  Endo/Heme/Allergies: Negative.   Psychiatric/Behavioral: Positive for depression and substance abuse.  All other systems reviewed and are negative.   Blood pressure 118/71, pulse 75, temperature 98.2 F (36.8 C), temperature source Oral, resp. rate 18, height 5' 4"  (1.626 m), weight 54 kg, SpO2 99 %.Body mass index is 20.43 kg/m.  General Appearance: Fairly Groomed  Eye Contact:  Good  Speech:  Normal Rate  Volume:  Normal  Mood:  presents vaguely depressed   Affect:  mildly constricted, does smile briefly at times   Thought Process:  Linear and Descriptions of Associations: Intact  Orientation:  Other:  fully alert and attentive- she is oriented   Thought Content:  no hallucinations , no delusions   Suicidal Thoughts:  No denies suicidal or self injurious ideations, denies homicidal or violent ideations   Homicidal Thoughts:  No  Memory:  recent and remote grossly intact   Judgement:  Fair  Insight:  Fair  Psychomotor Activity:  Normal- no current psychomotor agitation or restlessness   Concentration:  Concentration: Good and Attention Span: Good  Recall:  Good  Fund of Knowledge:  Good   Language:  Good  Akathisia:  Negative  Handed:  Right  AIMS (if indicated):     Assets:  Communication Skills Resilience  ADL's:  Intact  Cognition:  WNL  Sleep:  Number of Hours: 4.75    Treatment Plan Summary: Daily contact with patient to assess and evaluate symptoms and progress in treatment, Medication management, Plan inpatient treatment  and medications as below  Observation Level/Precautions:  15 minute checks  Laboratory:  as neeed  QTC was 495 - will repeat EKG to monitor  Psychotherapy:  Milieu, group therapy   Medications: Currently on Librium detox protocol to minimize risk of BZD withdrawal, and on Clonidine detox protocol to minimize risk of Opiate WDL  Currently not presenting with BZD withdrawal symptoms, vitals are stable- will switch from standing to PRN Librium detox protocol to minimize risk of excessive sedation or falls . Will taper Celexa to 20 mgrs QDAY   Consultations: as needed    Discharge Concerns:  -  Estimated LOS: 4-5 days  Other:     Physician Treatment Plan for Primary Diagnosis: BZD, Opiate Dependencies  Long Term Goal(s): Improvement in symptoms so as ready for discharge  Short Term Goals: Ability to identify changes in lifestyle to reduce recurrence of condition will improve, Ability to maintain clinical measurements within normal limits will improve and Ability to identify triggers associated with substance abuse/mental health issues will improve  Physician Treatment Plan for Secondary Diagnosis:  Depression, consider Substance Induced Mood Disorder versus MDD Long Term Goal(s): Improvement in  symptoms so as ready for discharge  Short Term Goals: Ability to identify changes in lifestyle to reduce recurrence of condition will improve, Ability to verbalize feelings will improve, Ability to disclose and discuss suicidal ideas, Ability to demonstrate self-control will improve, Ability to identify and develop effective coping behaviors will improve  and Ability to maintain clinical measurements within normal limits will improve  I certify that inpatient services furnished can reasonably be expected to improve the patient's condition.    Jenne Campus, MD 8/9/20193:02 PM

## 2017-10-03 NOTE — B[facility] Suicide Risk Assessment (Signed)
[facility] Admission Suicide Risk Assessment   Nursing information obtained from:  Patient, Review of record Demographic factors:  Caucasian, Age 67 or older Current Mental Status:  Suicidal ideation indicated by others Loss Factors:  NA Historical Factors:  Impulsivity Risk Reduction Factors:  Sense of responsibility to family, Living with another person, especially a relative  Total Time spent with patient: 45 minutes Principal Problem: MDD, Opiate , Benzodiazepine Dependencies  Diagnosis:   Patient Active Problem List   Diagnosis Date Noted  . MDD (major depressive disorder), recurrent severe, without psychosis ([facility]) [F33.2] 10/02/2017  . CTS (carpal tunnel syndrome) [G56.00] 06/08/2014   Subjective Data:   Continued Clinical Symptoms:  Alcohol Use Disorder Identification Test Final Score (AUDIT): 0 The "Alcohol Use Disorders Identification Test", Guidelines for Use in Primary Care, Second Edition.  World Science writer [facility]). Score between 0-7:  no or low risk or alcohol related problems. Score between 8-15:  moderate risk of alcohol related problems. Score between 16-19:  high risk of alcohol related problems. Score 20 or above:  warrants further diagnostic evaluation for alcohol dependence and treatment.   CLINICAL FACTORS:  67 year old married female, admitted under commitment after being found down and confused, with multiple tablets of ( prescribed)Tramadol, Klonopin,Hydrocodone  . Husband reported she had made suicidal statements recently, making threats to throw herself down stairs, cut her wrists, shoot self . Patient acknowledges taking more medications than prescribed at times, acknowledges some depression, but categorically denies suicidal ideations.   Psychiatric Specialty Exam: Physical Exam  ROS  Blood pressure 118/71, pulse 75, temperature 98.2 F (36.8 C), temperature source Oral, resp. rate 18, height 5\' 4"  (1.626 m), weight 54 kg, SpO2 99 %.Body mass index is  20.43 kg/m.  See admit note MSE                                                        COGNITIVE FEATURES THAT CONTRIBUTE TO RISK:  Closed-mindedness and Loss of executive function    SUICIDE RISK:   Moderate:  Frequent suicidal ideation with limited intensity, and duration, some specificity in terms of plans, no associated intent, good self-control, limited dysphoria/symptomatology, some risk factors present, and identifiable protective factors, including available and accessible social support.  PLAN OF CARE: Patient will be admitted to inpatient psychiatric unit for stabilization and safety. Will provide and encourage milieu participation. Provide medication management and maked adjustments as needed.  Will also provide medication management to minimize risk of WDL symptoms. Will follow daily.    I certify that inpatient services furnished can reasonably be expected to improve the patient's condition.   , MD 10/03/2017, 3:45 PM

## 2017-10-03 NOTE — Progress Notes (Signed)
Recreation Therapy Notes  Date: 8.9.19 Time: 0930 Location: [address] Dayroom  Group Topic: Stress Management  Goal Area(s) Addresses:  Patient will verbalize importance of using healthy stress management.  Patient will identify positive emotions associated with healthy stress management.   Intervention: Stress Management  Activity :  Guided Imagery.  LRT introduced the stress management technique of guided imagery.  LRT read a script to guide patients on mental vacation through a wildlife sanctuary.  Patients were to listen and follow along as LRT read script.  Education:  Stress Management, Discharge Planning.   Education Outcome: Acknowledges edcuation/In group clarification offered/Needs additional education  Clinical Observations/Feedback: Pt did not attend group.     Meghan Schwartz, LRT/CTRS         Aislinn Feliz A 10/03/2017 11:08 AM 

## 2017-10-04 DIAGNOSIS — F419 Anxiety disorder, unspecified: Secondary | ICD-10-CM

## 2017-10-04 DIAGNOSIS — Z87891 Personal history of nicotine dependence: Secondary | ICD-10-CM

## 2017-10-04 LAB — TSH: TSH: 1.466 u[IU]/mL (ref 0.350–4.500)

## 2017-10-04 MED ORDER — HYDROXYZINE HCL 50 MG PO TABS
50.0000 mg | ORAL_TABLET | Freq: Once | ORAL | Status: AC
  Administered 2017-10-04: 50 mg via ORAL
  Filled 2017-10-04 (×2): qty 1

## 2017-10-04 MED ORDER — CITALOPRAM HYDROBROMIDE 20 MG PO TABS
30.0000 mg | ORAL_TABLET | Freq: Every day | ORAL | Status: DC
  Administered 2017-10-05: 30 mg via ORAL
  Filled 2017-10-04 (×3): qty 1

## 2017-10-04 NOTE — Plan of Care (Signed)
D: Pt denies SI/HI/AVH. Pt is pleasant and cooperative. Pt stated she was still scared of certain people on the unit and very anxious. Pt stated that she had a situation the last time she was here with another woman and was very fearful of how she was acting and the thought of having a roommate" makes me very anxious and very very scared"  A: Pt was offered support and encouragement. Pt was given scheduled medications. Pt was encourage to attend groups. Q 15 minute checks were done for safety.   R:Pt attends groups and interacts well with peers and staff. Pt is taking medication. Pt receptive to treatment and safety maintained on unit.   Problem: Education: Goal: Emotional status will improve Outcome: Progressing   Problem: Education: Goal: Mental status will improve Outcome: Progressing   Problem: Activity: Goal: Interest or engagement in activities will improve Outcome: Progressing   Problem: Activity: Goal: Sleeping patterns will improve Outcome: Progressing   Problem: Coping: Goal: Ability to verbalize frustrations and anger appropriately will improve Outcome: Progressing   Problem: Coping: Goal: Ability to demonstrate self-control will improve Outcome: Progressing

## 2017-10-04 NOTE — BHH Group Notes (Signed)
LCSW Group Therapy Note  10/04/2017    10:30-11:30am   Type of Therapy and Topic:  Group Therapy: Anger and Coping Skills  Participation Level:  Active   Description of Group:   In this group, patients learned how to recognize the physical, cognitive, emotional, and behavioral responses they have to anger-provoking situations.  They identified how they usually or often react when angered, and learned how healthy and unhealthy coping skills work initially, but the unhealthy ones stop working.   They analyzed how their frequently-chosen coping skill is possibly beneficial and how it is possibly unhelpful.  The group discussed a variety of healthier coping skills that could help in resolving the actual issues, as well as how to go about planning for the the possibility of future similar situations.  Therapeutic Goals: 1. Patients will identify one thing that makes them angry and how they feel emotionally and physically, what their thoughts are or tend to be in those situations, and what healthy or unhealthy coping mechanism they typically use 2. Patients will identify how their coping technique works for them, as well as how it works against them. 3. Patients will explore possible new behaviors to use in future anger situations. 4. Patients will learn that anger itself is normal and cannot be eliminated, and that healthier coping skills can assist with resolving conflict rather than worsening situations.  Summary of Patient Progress:  The patient shared that she was angry 11 minutes before group started because her son and daughter had begged her to put their names on the contact list so they could be part of her treatment, then neither of them answered their phones when she called them.  She left angry messages for them.  She was very supportive of others throughout group and really made an extraordinary effort to let them vent their feelings and to affirm them.  Therapeutic Modalities:   Cognitive  Behavioral Therapy Motivation Interviewing  Lynnell Chad  .
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
## 2017-10-04 NOTE — Progress Notes (Signed)
Pt is concerned about having a roommate, pt stated she is scared being around crazy people, pt stated she is not crazy and would not understand why she can not be discharged. AC and Clinical research associate talked with pt and restated that no crazy people here and nobody is considered crazy, everyone come in when they are in crisis. Pt instructed to come to staff with any concerns and reminded that safety rounds are done every 15 min to ensure safety of all the pts.
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
## 2017-10-04 NOTE — Progress Notes (Signed)
Adult Psychoeducational Group Note  Date:  10/04/2017 Time:  10:18 PM  Group Topic/Focus:  Wrap-Up Group:   The focus of this group is to help patients review their daily goal of treatment and discuss progress on daily workbooks.  Participation Level:  Active  Participation Quality:  Appropriate  Affect:  Appropriate  Cognitive:  Appropriate  Insight: Appropriate  Engagement in Group:  Engaged  Modes of Intervention:  Discussion  Additional Comments:  Patient attended group and said that her day was a 2. She spend her day socializing.  Eldar Robitaille W Demia Viera 10/04/2017, 10:18 PM

## 2017-10-04 NOTE — Progress Notes (Signed)
DAR NOTE: Patient presents with anxious affect and depressed mood.  Pt has been present in the dayroom with peers, pt complained of feeling sad after attending social worker group this morning. Denies pain, auditory and visual hallucinations.  Rates depression at 3, hopelessness at 3, and anxiety at 2.  Maintained on routine safety checks.  Medications given as prescribed.  Support and encouragement offered as needed.  Attended group and participated.  States goal for today is " to show and internalize I am no threat to myself or others and return home to my loved ones."  Patient observed socializing with peers in the dayroom.  Offered no complaint.

## 2017-10-04 NOTE — Plan of Care (Signed)
  Problem: Education: Goal: Knowledge of [place] [facility] Education information/materials will improve Outcome: Progressing   Problem: Activity: Goal: Interest or engagement in activities will improve Outcome: Progressing   Problem: Coping: Goal: Ability to verbalize frustrations and anger appropriately will improve Outcome: Progressing   Problem: Safety: Goal: Periods of time without injury will increase Outcome: Progressing   

## 2017-10-04 NOTE — BHH Group Notes (Signed)
BHH Group Notes:  (Nursing/MHT/Case Management/Adjunct)  Date:  10/04/2017  Time:  4:27 PM  Type of Therapy:  Psychoeducational Skills  Participation Level:  Active  Participation Quality:  Appropriate  Affect:  Appropriate  Cognitive:  Appropriate  Insight:  Appropriate  Engagement in Group:  Engaged  Modes of Intervention:  Problem-solving  Bethann Punches 10/04/2017, 4:27 PM

## 2017-10-05 DIAGNOSIS — F1320 Sedative, hypnotic or anxiolytic dependence, uncomplicated: Secondary | ICD-10-CM

## 2017-10-05 MED ORDER — CITALOPRAM HYDROBROMIDE 10 MG PO TABS: 30.0000 mg | ORAL_TABLET | Freq: Every day | ORAL | 0 refills | Status: DC

## 2017-10-05 MED ORDER — CLONIDINE HCL 0.1 MG PO TABS: 0.1000 mg | ORAL_TABLET | Freq: Every day | ORAL | 0 refills | Status: DC

## 2017-10-05 MED ORDER — HYDROXYZINE HCL 25 MG PO TABS: 25.0000 mg | ORAL_TABLET | Freq: Four times a day (QID) | ORAL | 0 refills | Status: DC | PRN

## 2017-10-05 MED ORDER — SIMVASTATIN 20 MG PO TABS: 20.0000 mg | ORAL_TABLET | Freq: Every day | ORAL | 0 refills | Status: DC

## 2017-10-05 NOTE — Progress Notes (Signed)
Patient ID: Meghan Schwartz, female   DOB: [birth year], 67 y.o.   MRN: [id number]  Discharge Note  D) Patient discharged to lobby. Patient states readiness for discharge. Patient denies SI/HI, AVH and is not delusional or psychotic. Patient in no acute distress. Patient has completed their Suicide Safety Plan and has been provided copies.   A) Written and verbal discharge instructions given to the patient. Patient accepting to information and verbalized understanding. Patient agrees to the discharge plan. Opportunity for questions and concerns presented to patient. Patient denied any further questions or concerns. All belongings returned to patient.   R) Patient safely escorted to the lobby. Patient discharged from [facility] with prescriptions, personal belongings, follow-up appointment in place and discharge paperwork.

## 2017-10-05 NOTE — Progress Notes (Signed)
Patient ID: Meghan Schwartz, female   DOB: 1950-11-27, 67 y.o.   MRN: [id number]  Nursing Progress Note [phone]  Data: Patient presents with anxious mood and appears cautious/paranoid of peers and staff. Patient is preoccupied with discharge and expresses that she is upset about her hospitalization. Patient complaint with scheduled medications. Patient denies pain/physical complaints. Patient completed self-inventory sheet and rates depression, hopelessness, and anxiety 2,4,2 respectively. Patient rates their sleep and appetite as fair/good respectively. Patient states goal for today is to "leave".  Patient is seen visible in the milieu but does not attend groups. Patient currently denies SI/HI/AVH.   Action: Patient educated about and provided medication per provider's orders. Patient safety maintained with q15 min safety checks and frequent rounding. High fall risk precautions in place. Emotional support given. 1:1 interaction and active listening provided. Patient encouraged to attend meals and groups. Patient encouraged to work on treatment plan and goals. Labs, vital signs and patient behavior monitored throughout shift.   Response: Patient agrees to come to staff if any thoughts of SI/HI develop or if patient develops intention of acting on thoughts. Patient remains safe on the unit at this time. Patient is interacting with peers appropriately on the unit. Will continue to support and monitor.

## 2017-10-05 NOTE — BHH Group Notes (Signed)
BHH Group Notes: (Clinical Social Work)   10/05/2017      Type of Therapy:  Group Therapy   Participation Level:  Did Not Attend despite MHT prompting   Alton Bouknight Grossman-Orr, LCSW 10/05/2017, 1:33 PM     

## 2017-10-05 NOTE — Discharge Summary (Addendum)
Physician Discharge Summary Note  Patient:  Meghan Schwartz is an 67 y.o., female MRN:  [id number] DOB:  1950/07/14 Patient phone:  [phone] (home)  Patient address:   [address] [place] [address],  Total Time spent with patient: 30 minutes  Date of Admission:  10/02/2017 Date of Discharge:10/05/2017  Reason for Admission:  67 year old married , retired female, brought in to ED on 8/7 via EMS. States she thinks her husband called EMS. Report is that patient had been found lying at the top of her stairs on arrival, initially confused,  and that there were 30 Hydrocodone, 14 Klonopin, and 24 Tramadol tablets missing . As per ED notes, husband reported that she had fallen , crumpled as she went to the bathroom. Patient states she fell because she hit her head on a door that her husband slammed . Husband also reported that patient has made suicidal statements . She acknowledges  she has been depressed, sad recently, which she attributes in part to her adult son's marriage failing , and some increased financial tension after her husband retired. She states that she suffers from chronic pain..  At this time patient denies suicidal ideations and categorically denies recent event was a suicidal attempt. States she normally takes medications as prescribed but does acknowledge she has been taking more medications than prescribed at times , mostly Tramadol, which she states is to manage pain.  Currently patient denies neuro-vegetative symptoms of depression. Admission UDS positive for BZDs, not positive for opiates, admission BAL negative. Of note, Head CT ( 8/8) reported as no acute intracranial abnormality. Associated Signs/Symptoms: Depression Symptoms:  Denies persistent sadness or depression, denies changes in appetite, sleep, energy level (Hypo) Manic Symptoms:  Does not present with or endorse  Anxiety Symptoms:  Denies increased anxiety Psychotic Symptoms:  Denies  PTSD  Symptoms: Denies   Past Psychiatric History:one prior psychiatric admission in 2012, at which time she was diagnosed with opiate dependence, was treated with opiate detox protocol. At the time was discharged on Celexa and on Effexor . States she has never attempted suicide  Prior Inpatient Therapy:  as above   Prior Outpatient Therapy:  states psychiatric medications are prescribed by PCP  Substance Abuse History in the last 12 months: denies alcohol or drug abuse. She was being prescribed Klonopin, Norco, Tramadol prior to admission- reports she did sometimes take more Tramadol than prescribed    Consequences of Substance Abuse: Denies  Previous Psychotropic Medications: states she has been taking Celexa ( 40 mgrs ) for many years . Reports she was also prescribed Klonopin 1-2 mgrs QHS.  In the past was tried on Wellbutrin and Celexa Psychological Evaluations:No  Principal Problem: MDD (major depressive disorder), recurrent severe, without psychosis (HCC) Discharge Diagnoses: Patient Active Problem List   Diagnosis Date Noted  . MDD (major depressive disorder), recurrent severe, without psychosis (HCC) [F33.2] 10/02/2017  . CTS (carpal tunnel syndrome) [G56.00] 06/08/2014   Past Medical History:  Past Medical History:  Diagnosis Date  . Anxiety   . High cholesterol   . RA (rheumatoid arthritis) (HCC)     Past Surgical History:  Procedure Laterality Date  . CESAREAN SECTION  1978. 1983  . FOOT SURGERY Left 2008  . VAGINAL HYSTERECTOMY  2001   Family History:  Family History  Problem Relation Age of Onset  . Thyroid disease Mother   . Heart attack Paternal Grandfather   . Stroke Paternal Grandmother   . Arthritis Maternal Grandmother  Family Psychiatric  History: denies history of psychiatric illness in family , no suicides in family, no history of alcohol use disorder in family  Social History:  Social History   Substance and Sexual Activity  Alcohol Use No  .  Alcohol/week: 0.0 standard drinks     Social History   Substance and Sexual Activity  Drug Use No    Social History   Socioeconomic History  . Marital status: Married    Spouse name: Meghan Schwartz  . Number of children: 2  . Years of education: Ba  . Highest education level: Not on file  Occupational History  . Occupation: Retired   Engineer, production  . Financial resource strain: Not on file  . Food insecurity:    Worry: Not on file    Inability: Not on file  . Transportation needs:    Medical: Not on file    Non-medical: Not on file  Tobacco Use  . Smoking status: Former Smoker    Years: 20.00    Types: Cigarettes    Last attempt to quit: 02/25/2010    Years since quitting: 7.6  . Smokeless tobacco: Never Used  Substance and Sexual Activity  . Alcohol use: No    Alcohol/week: 0.0 standard drinks  . Drug use: No  . Sexual activity: Not on file  Lifestyle  . Physical activity:    Days per week: Not on file    Minutes per session: Not on file  . Stress: Not on file  Relationships  . Social connections:    Talks on phone: Not on file    Gets together: Not on file    Attends religious service: Not on file    Active member of club or organization: Not on file    Attends meetings of clubs or organizations: Not on file    Relationship status: Not on file  Other Topics Concern  . Not on file  Social History Narrative   Lives at home with spouse.    Right handed.   Caffeine use: 2 cups coffee/day    8 oz soda/day     Hospital Course: Meghan Schwartz was admitted for MDD (major depressive disorder), recurrent severe, without psychosis (HCC) and crisis management.  She was treated with the following medications Citalopram. She was started on Librium and Clonidine detox protocol.   Meghan Schwartz was discharged with current medication and was instructed on how to take medications as prescribed; (details listed below under Medication List).  Medical problems were identified and  treated as needed.  Home medications were restarted as appropriate.  Improvement was monitored by observation and Meghan Schwartz daily report of symptom reduction.  Emotional and mental status was monitored by daily self-inventory reports completed by Meghan Schwartz and clinical staff.         Meghan Schwartz was evaluated by the treatment team for stability and plans for continued recovery upon discharge.  Meghan Schwartz motivation was an integral factor for scheduling further treatment.  Employment, transportation, bed availability, health status, family support, and any pending legal issues were also considered during her hospital stay.  She was offered further treatment options upon discharge including but not limited to Residential, Intensive Outpatient, and Outpatient treatment.  Meghan Schwartz will follow up with the services as listed below under Follow Up Information.     Upon completion of this admission the Meghan Schwartz was both mentally and medically stable for discharge denying  suicidal/homicidal ideation, auditory/visual/tactile hallucinations, delusional thoughts and paranoia.      Physical Findings: AIMS: Facial and Oral Movements Muscles of Facial Expression: None, normal Lips and Perioral Area: None, normal Jaw: None, normal Tongue: None, normal,Extremity Movements Upper (arms, wrists, hands, fingers): None, normal Lower (legs, knees, ankles, toes): None, normal, Trunk Movements Neck, shoulders, hips: None, normal, Overall Severity Severity of abnormal movements (highest score from questions above): None, normal Incapacitation due to abnormal movements: None, normal Patient's awareness of abnormal movements (rate only patient's report): No Awareness, Dental Status Current problems with teeth and/or dentures?: No Does patient usually wear dentures?: No  CIWA:  CIWA-Ar Total: 1 COWS:  COWS Total Score: 0  Musculoskeletal: Strength & Muscle Tone: within normal limits Gait & Station:  normal Patient leans: N/A  Psychiatric Specialty Exam: See MD SRA Physical Exam  ROS  Blood pressure 129/89, pulse 80, temperature 97.7 F (36.5 C), temperature source Oral, resp. rate 18, height 5\' 4"  (1.626 m), weight 54 kg, SpO2 99 %.Body mass index is 20.43 kg/m.  Sleep:  Number of Hours: 6.25     Have you used any form of tobacco in the last 30 days? (Cigarettes, Smokeless Tobacco, Cigars, and/or Pipes): Yes  Has this patient used any form of tobacco in the last 30 days? (Cigarettes, Smokeless Tobacco, Cigars, and/or Pipes)  No  Blood Alcohol level:  Lab Results  Component Value Date   ETH <10 10/01/2017   [facility]  06/23/2010    <5 REPEATED TO VERIFY        LOWEST DETECTABLE LIMIT FOR SERUM ALCOHOL IS 5 mg/dL FOR MEDICAL PURPOSES ONLY    Metabolic Disorder Labs:  No results found for: HGBA1C, MPG No results found for: PROLACTIN No results found for: CHOL, TRIG, HDL, CHOLHDL, VLDL, LDLCALC  See Psychiatric Specialty Exam and Suicide Risk Assessment completed by Attending Physician prior to discharge.  Discharge destination:  Home  Is patient on multiple antipsychotic therapies at discharge:  No   Has Patient had three or more failed trials of antipsychotic monotherapy by history:  No  Recommended Plan for Multiple Antipsychotic Therapies: NA   Allergies as of 10/05/2017      Reactions   Erythromycin Nausea Only      Medication List    STOP taking these medications   amphetamine-dextroamphetamine 15 MG 24 hr capsule Commonly known as:  ADDERALL XR   clonazePAM 1 MG tablet Commonly known as:  KLONOPIN   HYDROcodone-acetaminophen 10-325 MG tablet Commonly known as:  NORCO   traMADol 50 MG tablet Commonly known as:  ULTRAM   vitamin B-12 1000 MCG tablet Commonly known as:  CYANOCOBALAMIN     TAKE these medications     Indication  BIOTIN PO Take 1 tablet by mouth daily.  Indication:  biotin   CENTRUM SILVER ULTRA WOMENS PO Take 1 tablet by mouth  daily.  Indication:  nutritional deficiency   citalopram 10 MG tablet Commonly known as:  CELEXA Take 3 tablets (30 mg total) by mouth daily. Start taking on:  10/06/2017 What changed:    medication strength  how much to take  Indication:  Depression   cloNIDine 0.1 MG tablet Commonly known as:  CATAPRES Take 1 tablet (0.1 mg total) by mouth daily before breakfast. Start taking on:  10/07/2017  Indication:  Opiate Withdrawal   estradiol 0.075 MG/24HR Commonly known as:  VIVELLE-DOT Place 1 patch onto the skin 2 (two) times a week. Tuesday and Friday  Indication:  Deficiency of  the Hormone Estrogen   etanercept 50 MG/ML injection Commonly known as:  ENBREL Inject 50 mg into the skin every Friday. Self injection once per week  Indication:  Rheumatoid Arthritis   hydroxychloroquine 200 MG tablet Commonly known as:  PLAQUENIL Take 200 mg by mouth daily.  Indication:  Rheumatoid Arthritis   hydrOXYzine 25 MG tablet Commonly known as:  ATARAX/VISTARIL Take 1 tablet (25 mg total) by mouth every 6 (six) hours as needed for anxiety.  Indication:  Feeling Anxious   ibuprofen 200 MG tablet Commonly known as:  ADVIL,MOTRIN Take 400 mg by mouth every 6 (six) hours as needed for moderate pain.  Indication:  Mild to Moderate Pain   simvastatin 20 MG tablet Commonly known as:  ZOCOR Take 1 tablet (20 mg total) by mouth at bedtime. What changed:  when to take this  Indication:  High Amount of Triglycerides in the Blood   vitamin C 1000 MG tablet Take 1,000 mg by mouth daily.  Indication:  Inadequate Vitamin C   Vitamin D (Cholecalciferol) 1000 units Caps Take 1,000 mg by mouth daily.  Indication:  vitamin D      Follow-up Information    [facility] up.   Specialty:  Behavioral Health Why:  The weekday social worker will call to make a follow-up with Pleas Koch for medication management, as well as an earlier appointment for therapy. Contact  information: [address] [place] [address] [phone]        [facility] Follow up.   Why:  Your Child psychotherapist will make an initial therapy appointment. Contact information: [address] [place] [address] [phone]           Follow-up recommendations:  Activity:  Increase activity as tolerated Diet:  Routine diet as directed  Tests:  Routine testing as directed. Other:  Even if you begin to feel better, continue taking your medications.   Signed: Truman Hayward, FNP 10/05/2017, 3:07 PM   Patient seen, Suicide Assessment Completed.  Disposition Plan Reviewed

## 2017-10-05 NOTE — [facility] Suicide Risk Assessment (Addendum)
[facility] Discharge Suicide Risk Assessment   Principal Problem: MDD (major depressive disorder), recurrent severe, without psychosis (HCC) Discharge Diagnoses:  Patient Active Problem List   Diagnosis Date Noted  . MDD (major depressive disorder), recurrent severe, without psychosis (HCC) [F33.2] 10/02/2017  . CTS (carpal tunnel syndrome) [G56.00] 06/08/2014    Total Time spent with patient: 30 minutes  Musculoskeletal: Strength & Muscle Tone: within normal limits Gait & Station: normal Patient leans: N/A  Psychiatric Specialty Exam: ROS no headache,no chest pain, no shortness of breath, no nausea, no vomiting, no fever, no chills   Blood pressure 129/89, pulse 80, temperature 97.7 F (36.5 C), temperature source Oral, resp. rate 18, height 5\' 4"  (1.626 m), weight 54 kg, SpO2 99 %.Body mass index is 20.43 kg/m.  General Appearance: improved grooming   Eye Contact::  Good  Speech:  Normal Rate409  Volume:  Normal  Mood:  mood is described as improved, denies feeling depressed, states that this is the best she has felt in a while  Affect:  appropriate, more reactive  Thought Process:  Linear and Descriptions of Associations: Intact  Orientation:  Full (Time, Place, and Person)  Thought Content:  no hallucinations, no delusions, not internally preoccupied   Suicidal Thoughts:  No denies suicidal or self injurious ideations, denies homicidal or violent ideations  Homicidal Thoughts:  No  Memory:  recent and remote grossly intact   Judgement:  Other:  improving   Insight:  improving   Psychomotor Activity:  Normal- no psychomotor restlessness, no psychomotor agitation, no tremors, no diaphoresis, vitals are stable  Concentration:  Good  Recall:  Good 3/3 immediate and 3/3 at 5 minutes   Fund of Knowledge:Good  Language: Good  Akathisia:  Negative  Handed:  Right  AIMS (if indicated):     Assets:  Communication Skills Desire for Improvement Resilience  Sleep:  Number of Hours:  6.25  Cognition: WNL  ADL's:  Intact   Mental Status Per Nursing Assessment::   On Admission:  Suicidal ideation indicated by others  Demographic Factors:  67 year old married female, has two adult children, retired 72   Loss Factors: Chronic medical illness- rheumatoid arthritis/pain, husband recently diagnosed with prostate cancer   Historical Factors: One prior psychiatric admission in 2012 , history of opiate use disorder, history of being treated with  opiate ( Tramadol) /BZDs  Risk Reduction Factors:   Sense of responsibility to family, Living with another person, especially a relative and Positive coping skills or problem solving skills  Continued Clinical Symptoms:  At this time patient is alert, attentive, well related, calm, reports mood as improved and denies feeling depressed, affect is appropriate, reactive, no thought disorder, no suicidal or self injurious ideations, no homicidal or violent ideations, no hallucinations, no delusions, not internally preoccupied, future oriented . She is not presenting with symptoms of opiate or BZD withdrawal. No tremors , no diaphoresis, no restlessness or psychomotor agitation . Vital signs are stable. * Patient is reporting much improvement and hoping for discharge today. She explains that she feels anxious  and apprehensive on unit and subjectively anxious and fearful around certain peers. She expresses understanding that she is in a safe and supportive setting but states she feels she would feel more comfortable at home, and describes a strong/close family support system .  CSW has communicated with patient's husband and with adult daughter, and they have corroborated that patient is much improved and that they are hoping for her to be  discharged today. With patient's express consent I spoke with her adult daughter . Daughter corroborates patient seems much improved and currently at baseline. States she feels her mother is ready  for discharge and will feel more comfortable at home with her family. States husband and herself will be providing daily support and monitoring medications . I have reviewed potential risks associated with BZD and opiate use, including risk of WDL, risk of overdosing, increased risk of falls and of cognitive side effects. I have encouraged patient to avoid/minimize opiate management, and to  discuss non opiate pain management strategies with her physician, and have reviewed rationale to avoid or minimize concomitant opiate and benzodiazepine management. She is not on standing opiate or BZD treatment at time of discharge , with stable vital signs and no significant symptoms of WDL.      Cognitive Features That Contribute To Risk:  No gross cognitive deficits noted upon discharge. Is alert , attentive, and oriented x 3   Suicide Risk:  Mild:  Suicidal ideation of limited frequency, intensity, duration, and specificity.  There are no identifiable plans, no associated intent, mild dysphoria and related symptoms, good self-control (both objective and subjective assessment), few other risk factors, and identifiable protective factors, including available and accessible social support.  Follow-up Information    [facility] Follow up.   Specialty:  Behavioral Health Why:  The weekday social worker will call to make a follow-up with Pleas Koch for medication management, as well as an earlier appointment for therapy. Contact information: [address] [place] [address] [phone]        [facility] Follow up.   Why:  Your Child psychotherapist will make an initial therapy appointment. Contact information: [address] [place] [address] [phone]           Plan Of Care/Follow-up recommendations:  Activity:  as tolerated Diet:  regular Tests:  NA Other:  see below  Patient and family are requesting  discharge and there are no ongoing grounds for involuntary commitment at this time. Patient is being discharged in good spirits, plans to return to outpatient psychiatric management as above . Plans to continue management with her PCP and rheumatologist  for medical issues, pain management.    Craige Cotta, MD 10/05/2017, 2:53 PM

## 2017-10-05 NOTE — B[facility] Suicide Risk Assessment (Signed)
[facility] INPATIENT:  Family/Significant Other Suicide Prevention Education  Suicide Prevention Education:  Education Completed; daughter Daffney Greenly and husband Niralya Ohanian has been identified by the patient as the family member/significant other with whom the patient will be residing, and identified as the person(s) who will aid the patient in the event of a mental health crisis (suicidal ideations/suicide attempt).  With written consent from the patient, the family member/significant other has been provided the following suicide prevention education, prior to the and/or following the discharge of the patient.  The suicide prevention education provided includes the following:  Suicide risk factors  Suicide prevention and interventions  National Suicide Hotline telephone number  [facility] Cl[facility] assessment telephone number  [facility] Emergency Assistance 911  [facility] and/or Residential Mobile Crisis Unit telephone number  Request made of family/significant other to:  Remove weapons (e.g., guns, rifles, knives), all items previously/currently identified as safety concern.    Remove drugs/medications (over-the-counter, prescriptions, illicit drugs), all items previously/currently identified as a safety concern.  The family member/significant other verbalizes understanding of the suicide prevention education information provided.  The family member/significant other agrees to remove the items of safety concern listed above.  All guns have been removed from the home.  Carloyn Jaeger Grossman-Orr 10/05/2017, 9:05 AM

## 2017-10-05 NOTE — Progress Notes (Addendum)
  [facility] Adult Case Management Discharge Plan :  Will you be returning to the same living situation after discharge:  Yes,  home with husband At discharge, do you have transportation home?: Yes,  family Do you have the ability to pay for your medications: Yes,  denies barriers  Release of information consent forms completed and turned in to Medical Records by CSW.   Patient to Follow up at: Follow-up Information    [facility] Follow up.   Specialty:  Behavioral Health Why:  The weekday social worker will call to make a follow-up with Pleas Koch for medication management. Contact information: [address] [place] [address] [phone]        [facility] Follow up.   Why:  Your Child psychotherapist will make an initial therapy appointment. Contact information: [address] [place] [address] [phone]           Next level of care provider has access to [facility] Link:no  Safety Planning and Suicide Prevention discussed: Yes,  with husband and daughter  Have you used any form of tobacco in the last 30 days? (Cigarettes, Smokeless Tobacco, Cigars, and/or Pipes): Yes  Has patient been referred to the Quitline?: Patient refused referral  Patient has been referred for addiction treatment: Yes  Lynnell Chad, LCSW 10/05/2017, 3:29 PM

## 2017-10-05 NOTE — BHH Counselor (Addendum)
Clinical Social Work Note  CSW had scheduled phone conversation with patient's daughter Maryama Kuriakose [phone] and husband Quintana Canelo, on speaker phone.  Their questions were answered as to why patient was sent to [facility] from hospital, her treating diagnosis, information on potential length of stay, and that there is not really a possibility of her being transferred to another psychiatric facility.  They reiterated that she was traumatized during her first [facility] stay years ago and continues to say she is afraid, although they realize it may be a result of faulty thinking and not based in any real threat.  They were informed she is in a Do Not Admit room for now.  The family has been visiting and has been making plans for ongoing care when she is out of the hospital.  They are worried about her, but do feel she would do better if she were able to come home.  They stated they never had an intention of sending her back to [facility] because of her earlier experience, and they have been questioning their decision to call an ambulance the other day as a result.  CSW went over the Suicide Prevention Education, including risk factors and warning signs, and assured them that they really did the right thing and had no choice but to call an ambulance and do the Involuntary Commitment paperwork.   The family feels she would be safe coming home and specifically stated they will take responsibility for her safety.  Husband is retired and home all the time, son lives only 10 minutes away, next door neighbors are retired and constantly available.  She has continued to say she has never been suicidal, just as she has to staff at the hospital.  She does not remember making those statements, and has lashed out at them about it; however, CSW encouraged them to not back down on what actually had happened, but to gently reassure her that while she may not remember it, she did actually talk about wanting to commit suicide.  The family has  agreed to "not put so much on her shoulders" such as information about problems in marriages and with her grandchildren.  Her husband's cancer is under control for now.  Her rheumatoid arthritis is flaring up pretty seriously, and it is time for her Enbrel injection, so the family is asking if she can get her injection today.  Finally, the family feels she is mentally "okay" and would recover at home better at this point.  Family states she has seen a Dr. Frazier Richards at [facility] and has another appointment scheduled 11/04/17 at 1pm - PT REPORTS THIS IS HAIR APPT ONLY.  It is also marked that she is to see medication manager Pleas Koch at some point.  The family feels she needs to be seen by a therapist more than once a month and were excited to hear she made a commitment to CSW to go long-term to therapy.  CSW will need to make sooner follow-up plans than what is currently in place.  Ambrose Mantle, LCSW 10/05/2017, 8:55 AM

## 2017-10-05 NOTE — [facility] Group Notes (Signed)
Adult Psychoeducational Group Note  Date:  10/05/2017 Time:  1:00 PM  Group Topic/Focus: Life Skills Facilitator: Patty D., RN  Participation Level:  Active  Participation Quality:  Appropriate  Affect:  Appropriate  Cognitive:  Alert and Oriented  Insight: Improving  Engagement in Group:  Developing/Improving  Modes of Intervention:  Discussion and Education  Additional Comments:    Rianne Degraaf A Jamille Fisher 10/05/2017, 2:00 PM 

## 2018-01-20 ENCOUNTER — Emergency Department (HOSPITAL_BASED_OUTPATIENT_CLINIC_OR_DEPARTMENT_OTHER): Payer: Medicare Other

## 2018-01-20 ENCOUNTER — Encounter (HOSPITAL_BASED_OUTPATIENT_CLINIC_OR_DEPARTMENT_OTHER): Payer: Self-pay | Admitting: Emergency Medicine

## 2018-01-20 ENCOUNTER — Other Ambulatory Visit: Payer: Self-pay

## 2018-01-20 ENCOUNTER — Emergency Department (HOSPITAL_BASED_OUTPATIENT_CLINIC_OR_DEPARTMENT_OTHER)
Admission: EM | Admit: 2018-01-20 | Discharge: 2018-01-20 | Disposition: A | Discharge status: Home / Self Care | Payer: Medicare Other | Attending: Emergency Medicine | Admitting: Emergency Medicine

## 2018-01-20 DIAGNOSIS — L03116 Cellulitis of left lower limb: Secondary | ICD-10-CM

## 2018-01-20 DIAGNOSIS — M7122 Synovial cyst of popliteal space [Baker], left knee: Secondary | ICD-10-CM | POA: Insufficient documentation

## 2018-01-20 DIAGNOSIS — Z87891 Personal history of nicotine dependence: Secondary | ICD-10-CM | POA: Diagnosis not present

## 2018-01-20 DIAGNOSIS — M79605 Pain in left leg: Secondary | ICD-10-CM | POA: Diagnosis present

## 2018-01-20 MED ORDER — CEPHALEXIN 500 MG PO CAPS: 500.0000 mg | ORAL_CAPSULE | Freq: Three times a day (TID) | ORAL | 0 refills | Status: AC

## 2018-01-20 NOTE — ED Notes (Signed)
ED Provider at bedside. 

## 2018-01-20 NOTE — ED Notes (Signed)
Pt on keflex and lasix

## 2018-01-20 NOTE — ED Notes (Signed)
NAD at this time. Pt is stable and going home.  

## 2018-01-20 NOTE — Discharge Instructions (Signed)
You have been seen today in the Emergency Department (ED) for cellulitis, a superficial skin infection. Please take your antibiotics as prescribed for their ENTIRE prescribed duration.  Take Tylenol as needed for pain, but only as written on the box.  ° °Please follow up with your doctor or in the ED in 24-48 hours for recheck of your infection if you are not improving.  Call your doctor sooner or return to the ED if you develop worsening signs of infection such as: increased redness, increased pain, pus, fever, or other symptoms that concern you. ° °

## 2018-01-20 NOTE — ED Provider Notes (Signed)
Emergency Department Provider Note   I have reviewed the triage vital signs and the nursing notes.   HISTORY  Chief Complaint Leg Pain   HPI Meghan Schwartz is a 67 y.o. female with PMH of anxiety, HLD, RA, and LE edema newly on lasix who presents to the emergency department for evaluation of worsening left lower leg pain with redness and unilateral swelling.  Patient has no prior history of DVT.  She denies any chest pain or shortness of breath.  She had redness surrounding a wound in her left leg after dropping an object on it 3 to 4 weeks ago.  Within the last week she completed a 5-day course of Keflex and her symptoms had improved.  Over the last several days she is noticed worsening pain in the leg with return of redness.  No fevers or chills.  She has pain both anterior and posterior in the calf.  No injury.  She also began taking Lasix within the past week.  She is prescribed a 10-day course and feel that overall her right leg swelling has gone down with using Lasix but not the left. No anticoagulation.    Past Medical History:  Diagnosis Date  . Anxiety   . High cholesterol   . RA (rheumatoid arthritis) [facility])     Patient Active Problem List   Diagnosis Date Noted  . Benzodiazepine dependence (HCC)   . MDD (major depressive disorder), recurrent severe, without psychosis (HCC) 10/02/2017  . CTS (carpal tunnel syndrome) 06/08/2014    Past Surgical History:  Procedure Laterality Date  . CESAREAN SECTION  1978. 1983  . FOOT SURGERY Left 2008  . VAGINAL HYSTERECTOMY  2001    Allergies Erythromycin  Family History  Problem Relation Age of Onset  . Thyroid disease Mother   . Heart attack Paternal Grandfather   . Stroke Paternal Grandmother   . Arthritis Maternal Grandmother     Social History Social History   Tobacco Use  . Smoking status: Former Smoker    Years: 20.00    Types: Cigarettes    Last attempt to quit: 02/25/2010    Years since quitting: 7.9  .  Smokeless tobacco: Never Used  Substance Use Topics  . Alcohol use: No    Alcohol/week: 0.0 standard drinks  . Drug use: No    Review of Systems  Constitutional: No fever/chills Cardiovascular: Denies chest pain. Respiratory: Denies shortness of breath. Gastrointestinal: No abdominal pain.  No nausea, no vomiting.  No diarrhea.  No constipation. Genitourinary: Negative for dysuria. Musculoskeletal: Negative for back pain. Left leg pain.  Skin: Left leg redness.   Neurological: Negative for headaches, focal weakness or numbness.  10-point ROS otherwise negative.  ____________________________________________   PHYSICAL EXAM:  VITAL SIGNS: ED Triage Vitals  Enc Vitals Group     BP 01/20/18 1524 133/89     Pulse Rate 01/20/18 1524 95     Resp 01/20/18 1524 18     Temp 01/20/18 1524 99.4 F (37.4 C)     Temp Source 01/20/18 1524 Oral     SpO2 01/20/18 1524 96 %     Weight 01/20/18 1523 130 lb (59 kg)     Height 01/20/18 1523 5\' 4"  (1.626 m)     Pain Score 01/20/18 1523 7   Constitutional: Alert and oriented. Well appearing and in no acute distress. Eyes: Conjunctivae are normal.  Head: Atraumatic. Nose: No congestion/rhinnorhea. Mouth/Throat: Mucous membranes are moist. Neck: No stridor. Cardiovascular: Good peripheral  circulation. Normal DP and PT pulses in the bilateral feet.  Respiratory: Normal respiratory effort. Gastrointestinal: No distention.  Musculoskeletal: L > R leg swelling with some associated venous stasis type skin changes. Left leg more erythematous and warm to touch compared to the right. Normal ROM of the left knee and ankle. No joint redness or warmth, specifically. No abscess appreciated. Soft, lateral left knee mass without fluctuance, erythema, or tenderness.  Neurologic:  Normal speech and language. No gross focal neurologic deficits are appreciated.  Skin:  Skin is warm, dry and intact. Venous stasis changes in the LEs bilaterally with erythema  worse on the left.  ____________________________________________  RADIOLOGY  US Venous Img Lower  Left (dvt Study)  Result Date: 01/20/2018 CLINICAL DATA:  Acute left lower extremity pain. EXAM: Left LOWER EXTREMITY VENOUS DOPPLER ULTRASOUND TECHNIQUE: Gray-scale sonography with graded compression, as well as color Doppler and duplex ultrasound were performed to evaluate the lower extremity deep venous systems from the level of the common femoral vein and including the common femoral, femoral, profunda femoral, popliteal and calf veins including the posterior tibial, peroneal and gastrocnemius veins when visible. The superficial great saphenous vein was also interrogated. Spectral Doppler was utilized to evaluate flow at rest and with distal augmentation maneuvers in the common femoral, femoral and popliteal veins. COMPARISON:  None. FINDINGS: Contralateral Common Femoral Vein: Respiratory phasicity is normal and symmetric with the symptomatic side. No evidence of thrombus. Normal compressibility. Common Femoral Vein: No evidence of thrombus. Normal compressibility, respiratory phasicity and response to augmentation. Saphenofemoral Junction: No evidence of thrombus. Normal compressibility and flow on color Doppler imaging. Profunda Femoral Vein: No evidence of thrombus. Normal compressibility and flow on color Doppler imaging. Femoral Vein: No evidence of thrombus. Normal compressibility, respiratory phasicity and response to augmentation. Popliteal Vein: No evidence of thrombus. Normal compressibility, respiratory phasicity and response to augmentation. Calf Veins: No evidence of thrombus. Normal compressibility and flow on color Doppler imaging. Superficial Great Saphenous Vein: No evidence of thrombus. Normal compressibility. Venous Reflux:  None. Other Findings: Probable Baker's cyst measuring 5.4 x 3.2 x 1.2 cm is noted posteriorly in the left knee. IMPRESSION: No evidence of deep venous thrombosis seen  in left lower extremity. Probable Baker's cyst seen in left popliteal fossa. Electronically Signed   By: Lupita Raider, M.D.   On: 01/20/2018 17:56    ____________________________________________   PROCEDURES  Procedure(s) performed:   Procedures  None ____________________________________________   INITIAL IMPRESSION / ASSESSMENT AND PLAN / ED COURSE  Pertinent labs & imaging results that were available during my care of the patient were reviewed by me and considered in my medical decision making (see chart for details).  Patient presents to the emergency department for evaluation of left leg pain.  Triage note describes left knee pain for 2 weeks but her pain is actually more than the lower leg both anterior and posterior.  She has some swelling on the left greater than right with possibly developing cellulitis versus venous stasis dermatitis.  No other signs or symptoms of infection although this rash did improve initially with 5 days of Keflex.  No concern for abscess clinically.  No evidence to suspect septic arthritis.  Plan for DVT ultrasound to rule out acute thrombus and reassess.  Would consider longer course of Keflex if DVT ultrasound is normal.  She with no chest pain, shortness of breath symptoms to suspect PE clinically.  6:09 PM Negative for DVT.  Area of possible Baker's cyst identified.  Suspect that the patient's left leg pain is multifactorial.  Gust the Baker's cyst with the patient and need for compression and medicine follow-up.  Plan for Keflex for 10 days rather than 5 this initially improve the patient's lower extremity cellulitis.  Patient to complete the course of her Lasix and follow-up with her PCP before continuing further.  Discussed ED return precautions in detail. ____________________________________________  FINAL CLINICAL IMPRESSION(S) / ED DIAGNOSES  Final diagnoses:  Left leg pain  Synovial cyst of left popliteal space  Cellulitis of left lower  extremity    NEW OUTPATIENT MEDICATIONS STARTED DURING THIS VISIT:  New Prescriptions   CEPHALEXIN (KEFLEX) 500 MG CAPSULE    Take 1 capsule (500 mg total) by mouth 3 (three) times daily for 10 days.    Note:  This document was prepared using Dragon voice recognition software and may include unintentional dictation errors.  Alona Bene, MD Emergency Medicine    Barbi Kumagai, Arlyss Repress, MD 01/20/18 [phone]

## 2018-01-20 NOTE — ED Triage Notes (Addendum)
L knee pain x 2 weeks, states there is a lump there. She has been seen by ortho and given an antibiotic for an infection. Pain persists and she states swelling has progressed to her thigh.

## 2018-01-20 NOTE — ED Notes (Signed)
Patient transported to Ultrasound 

## 2018-01-25 ENCOUNTER — Encounter (HOSPITAL_COMMUNITY): Payer: Self-pay

## 2018-01-25 ENCOUNTER — Emergency Department (HOSPITAL_COMMUNITY): Payer: Medicare Other

## 2018-01-25 ENCOUNTER — Other Ambulatory Visit: Payer: Self-pay

## 2018-01-25 ENCOUNTER — Inpatient Hospital Stay (HOSPITAL_COMMUNITY)
Admission: EM | Admit: 2018-01-25 | Discharge: 2018-01-28 | DRG: 247 | Disposition: A | Discharge status: Home / Self Care | Payer: Medicare Other | Attending: Internal Medicine | Admitting: Internal Medicine

## 2018-01-25 ENCOUNTER — Observation Stay (HOSPITAL_COMMUNITY): Payer: Medicare Other

## 2018-01-25 DIAGNOSIS — Z8349 Family history of other endocrine, nutritional and metabolic diseases: Secondary | ICD-10-CM

## 2018-01-25 DIAGNOSIS — Z8261 Family history of arthritis: Secondary | ICD-10-CM

## 2018-01-25 DIAGNOSIS — E785 Hyperlipidemia, unspecified: Secondary | ICD-10-CM | POA: Diagnosis present

## 2018-01-25 DIAGNOSIS — Z8249 Family history of ischemic heart disease and other diseases of the circulatory system: Secondary | ICD-10-CM

## 2018-01-25 DIAGNOSIS — L03116 Cellulitis of left lower limb: Secondary | ICD-10-CM | POA: Diagnosis present

## 2018-01-25 DIAGNOSIS — R079 Chest pain, unspecified: Secondary | ICD-10-CM | POA: Diagnosis not present

## 2018-01-25 DIAGNOSIS — F418 Other specified anxiety disorders: Secondary | ICD-10-CM | POA: Diagnosis present

## 2018-01-25 DIAGNOSIS — Z820 Family history of epilepsy and other diseases of the nervous system: Secondary | ICD-10-CM

## 2018-01-25 DIAGNOSIS — E876 Hypokalemia: Secondary | ICD-10-CM | POA: Diagnosis present

## 2018-01-25 DIAGNOSIS — Z720 Tobacco use: Secondary | ICD-10-CM | POA: Diagnosis present

## 2018-01-25 DIAGNOSIS — M069 Rheumatoid arthritis, unspecified: Secondary | ICD-10-CM | POA: Diagnosis present

## 2018-01-25 DIAGNOSIS — Z803 Family history of malignant neoplasm of breast: Secondary | ICD-10-CM

## 2018-01-25 DIAGNOSIS — I214 Non-ST elevation (NSTEMI) myocardial infarction: Secondary | ICD-10-CM | POA: Diagnosis not present

## 2018-01-25 DIAGNOSIS — M25539 Pain in unspecified wrist: Secondary | ICD-10-CM

## 2018-01-25 DIAGNOSIS — F339 Major depressive disorder, recurrent, unspecified: Secondary | ICD-10-CM | POA: Diagnosis present

## 2018-01-25 DIAGNOSIS — F17210 Nicotine dependence, cigarettes, uncomplicated: Secondary | ICD-10-CM | POA: Diagnosis present

## 2018-01-25 DIAGNOSIS — Z823 Family history of stroke: Secondary | ICD-10-CM

## 2018-01-25 DIAGNOSIS — L03119 Cellulitis of unspecified part of limb: Secondary | ICD-10-CM | POA: Diagnosis present

## 2018-01-25 DIAGNOSIS — Z955 Presence of coronary angioplasty implant and graft: Secondary | ICD-10-CM

## 2018-01-25 HISTORY — DX: Carpal tunnel syndrome, unspecified upper limb: G56.00

## 2018-01-25 HISTORY — DX: Sedative, hypnotic or anxiolytic dependence, uncomplicated: F13.20

## 2018-01-25 HISTORY — DX: Major depressive disorder, recurrent severe without psychotic features: F33.2

## 2018-01-25 LAB — CBC
HEMATOCRIT: 41.9 % (ref 36.0–46.0)
HEMOGLOBIN: 13.1 g/dL (ref 12.0–15.0)
MCH: 27.2 pg (ref 26.0–34.0)
MCHC: 31.3 g/dL (ref 30.0–36.0)
MCV: 87.1 fL (ref 80.0–100.0)
Platelets: 242 10*3/uL (ref 150–400)
RBC: 4.81 MIL/uL (ref 3.87–5.11)
RDW: 12.3 % (ref 11.5–15.5)
WBC: 4.2 10*3/uL (ref 4.0–10.5)
nRBC: 0 % (ref 0.0–0.2)

## 2018-01-25 LAB — PHOSPHORUS: Phosphorus: 3.2 mg/dL (ref 2.5–4.6)

## 2018-01-25 LAB — BASIC METABOLIC PANEL
ANION GAP: 8 (ref 5–15)
BUN: 12 mg/dL (ref 8–23)
CO2: 29 mmol/L (ref 22–32)
Calcium: 8.6 mg/dL — ABNORMAL LOW (ref 8.9–10.3)
Chloride: 100 mmol/L (ref 98–111)
Creatinine, Ser: 0.65 mg/dL (ref 0.44–1.00)
GFR calc Af Amer: >60 mL/min (ref >60)
GFR calc non Af Amer: >60 mL/min (ref >60)
Glucose, Bld: 131 mg/dL — ABNORMAL HIGH (ref 70–99)
POTASSIUM: 3.3 mmol/L — AB (ref 3.5–5.1)
Sodium: 137 mmol/L (ref 135–145)

## 2018-01-25 LAB — I-STAT TROPONIN, ED: Troponin i, poc: 0.01 ng/mL (ref 0.00–0.08)

## 2018-01-25 LAB — MAGNESIUM: MAGNESIUM: 2 mg/dL (ref 1.7–2.4)

## 2018-01-25 MED ORDER — NICOTINE 21 MG/24HR TD PT24
21.0000 mg | MEDICATED_PATCH | Freq: Every day | TRANSDERMAL | Status: DC | PRN
  Administered 2018-01-25 – 2018-01-27 (×3): 21 mg via TRANSDERMAL
  Filled 2018-01-25 (×3): qty 1

## 2018-01-25 MED ORDER — POTASSIUM CHLORIDE CRYS ER 20 MEQ PO TBCR
40.0000 meq | EXTENDED_RELEASE_TABLET | Freq: Once | ORAL | Status: AC
  Administered 2018-01-25: 40 meq via ORAL
  Filled 2018-01-25: qty 2

## 2018-01-25 MED ORDER — OXYCODONE HCL 5 MG PO TABS
5.0000 mg | ORAL_TABLET | Freq: Once | ORAL | Status: AC
  Administered 2018-01-25: 5 mg via ORAL
  Filled 2018-01-25: qty 1

## 2018-01-25 MED ORDER — CLONIDINE HCL 0.1 MG PO TABS
0.1000 mg | ORAL_TABLET | Freq: Every day | ORAL | Status: DC
  Administered 2018-01-26 – 2018-01-28 (×2): 0.1 mg via ORAL
  Filled 2018-01-25 (×2): qty 1

## 2018-01-25 MED ORDER — HYDROXYCHLOROQUINE SULFATE 200 MG PO TABS
200.0000 mg | ORAL_TABLET | Freq: Every day | ORAL | Status: DC
  Administered 2018-01-26 – 2018-01-27 (×2): 200 mg via ORAL
  Filled 2018-01-25 (×4): qty 1

## 2018-01-25 MED ORDER — BUPRENORPHINE HCL-NALOXONE HCL 8-2 MG SL SUBL
1.0000 | SUBLINGUAL_TABLET | Freq: Two times a day (BID) | SUBLINGUAL | Status: DC
  Administered 2018-01-26 – 2018-01-28 (×4): 1 via SUBLINGUAL
  Filled 2018-01-25 (×5): qty 1

## 2018-01-25 MED ORDER — CLONAZEPAM 1 MG PO TABS
1.0000 mg | ORAL_TABLET | Freq: Every day | ORAL | Status: DC
  Administered 2018-01-25: 1 mg via ORAL
  Filled 2018-01-25: qty 1

## 2018-01-25 MED ORDER — IBUPROFEN 200 MG PO TABS: 400.0000 mg | ORAL_TABLET | Freq: Four times a day (QID) | ORAL | Status: DC | PRN

## 2018-01-25 MED ORDER — BUPRENORPHINE HCL-NALOXONE HCL 8-2 MG SL SUBL: 1.0000 | SUBLINGUAL_TABLET | Freq: Every day | SUBLINGUAL | Status: DC

## 2018-01-25 MED ORDER — HEPARIN SODIUM (PORCINE) 5000 UNIT/ML IJ SOLN: 5000.0000 [IU] | Freq: Three times a day (TID) | INTRAMUSCULAR | Status: DC

## 2018-01-25 MED ORDER — ONDANSETRON HCL 4 MG/2ML IJ SOLN: 4.0000 mg | Freq: Four times a day (QID) | INTRAMUSCULAR | Status: DC | PRN

## 2018-01-25 MED ORDER — ACETAMINOPHEN 650 MG RE SUPP: 650.0000 mg | Freq: Four times a day (QID) | RECTAL | Status: DC | PRN

## 2018-01-25 MED ORDER — CEPHALEXIN 500 MG PO CAPS
500.0000 mg | ORAL_CAPSULE | Freq: Three times a day (TID) | ORAL | Status: DC
  Administered 2018-01-25 – 2018-01-28 (×6): 500 mg via ORAL
  Filled 2018-01-25 (×8): qty 1

## 2018-01-25 MED ORDER — NITROGLYCERIN 0.4 MG SL SUBL: 0.4000 mg | SUBLINGUAL_TABLET | SUBLINGUAL | Status: DC | PRN

## 2018-01-25 MED ORDER — ONDANSETRON HCL 4 MG PO TABS: 4.0000 mg | ORAL_TABLET | Freq: Four times a day (QID) | ORAL | Status: DC | PRN

## 2018-01-25 MED ORDER — HEPARIN SODIUM (PORCINE) 5000 UNIT/ML IJ SOLN
5000.0000 [IU] | Freq: Three times a day (TID) | INTRAMUSCULAR | Status: DC
  Administered 2018-01-25 – 2018-01-26 (×4): 5000 [IU] via SUBCUTANEOUS
  Filled 2018-01-25 (×4): qty 1

## 2018-01-25 MED ORDER — KETOROLAC TROMETHAMINE 15 MG/ML IJ SOLN
15.0000 mg | Freq: Four times a day (QID) | INTRAMUSCULAR | Status: AC | PRN
  Administered 2018-01-26 – 2018-01-27 (×5): 15 mg via INTRAVENOUS
  Filled 2018-01-25 (×6): qty 1

## 2018-01-25 MED ORDER — CITALOPRAM HYDROBROMIDE 20 MG PO TABS
30.0000 mg | ORAL_TABLET | Freq: Every day | ORAL | Status: DC
  Administered 2018-01-26 – 2018-01-28 (×3): 30 mg via ORAL
  Filled 2018-01-25 (×3): qty 2

## 2018-01-25 MED ORDER — ALPRAZOLAM 0.25 MG PO TABS: 0.2500 mg | ORAL_TABLET | Freq: Every evening | ORAL | Status: DC | PRN

## 2018-01-25 MED ORDER — ASPIRIN 81 MG PO CHEW
162.0000 mg | CHEWABLE_TABLET | Freq: Once | ORAL | Status: AC
  Administered 2018-01-25: 162 mg via ORAL
  Filled 2018-01-25: qty 2

## 2018-01-25 MED ORDER — HYDROXYZINE HCL 25 MG PO TABS
25.0000 mg | ORAL_TABLET | Freq: Four times a day (QID) | ORAL | Status: DC | PRN
  Administered 2018-01-26 – 2018-01-28 (×3): 25 mg via ORAL
  Filled 2018-01-25 (×4): qty 1

## 2018-01-25 MED ORDER — ACETAMINOPHEN 325 MG PO TABS
650.0000 mg | ORAL_TABLET | Freq: Four times a day (QID) | ORAL | Status: DC | PRN
  Administered 2018-01-27 (×2): 650 mg via ORAL
  Filled 2018-01-25 (×2): qty 2

## 2018-01-25 MED ORDER — SIMVASTATIN 20 MG PO TABS
20.0000 mg | ORAL_TABLET | Freq: Every day | ORAL | Status: DC
  Administered 2018-01-25 – 2018-01-27 (×3): 20 mg via ORAL
  Filled 2018-01-25 (×3): qty 1

## 2018-01-25 NOTE — ED Triage Notes (Addendum)
Patient having severe pain in left arm x1 that has has been worse x2 days. Also chest tightness started today.This afternoon both arms were hurting severely but left arm more. Describes arm pain as "throbbing on fire pain".  C/o up upper back pain. Current smoker.   Patient 4 weeks ago patient has cellulitis in both legs. Patient taking antibiotics and took last lasix today.   A/Ox4 Ambulatory in triage.  Hx. RA

## 2018-01-25 NOTE — ED Provider Notes (Signed)
[facility] Emergency Department Provider Note MRN:  [id number]  Arrival date & time: 01/25/18     Chief Complaint   Chest Pain and Arm Pain   History of Present Illness   Meghan Schwartz is a 67 y.o. year-old female with a history of high cholesterol, rheumatoid arthritis presenting to the ED with chief complaint of chest pain.  The pain is located in the central chest, has been on and off for 24 hours, most severe last night.  Pain radiates to bilateral arms, left greater than right.  Described as a tightness/pressure.  The pain was so severe that she thought she was going to die.  Associated with nausea, diaphoresis.  Denies headache or vision change, no shortness of breath, no abdominal pain, no new leg pain or swelling.  The pain has been worse with exertion.  Review of Systems  A complete 10 system review of systems was obtained and all systems are negative except as noted in the HPI and PMH.   Patient's Health History    Past Medical History:  Diagnosis Date  . Anxiety   . Benzodiazepine dependence (HCC)   . CTS (carpal tunnel syndrome) 06/08/2014  . High cholesterol   . MDD (major depressive disorder), recurrent severe, without psychosis (HCC) 10/02/2017  . RA (rheumatoid arthritis) (HCC)     Past Surgical History:  Procedure Laterality Date  . CESAREAN SECTION  1978. 1983  . FOOT SURGERY Left 2008  . VAGINAL HYSTERECTOMY  2001    Family History  Problem Relation Age of Onset  . Thyroid disease Mother   . Breast cancer Mother   . Parkinson's disease Father   . Heart attack Paternal Grandfather   . Stroke Paternal Grandmother   . Arthritis Maternal Grandmother   . Raynaud syndrome Maternal Aunt     Social History   Socioeconomic History  . Marital status: Married    Spouse name: Dayla Gasca  . Number of children: 2  . Years of education: Ba  . Highest education level: Not on file  Occupational History  . Occupation: Retired   Sports coach  . Financial resource strain: Not on file  . Food insecurity:    Worry: Not on file    Inability: Not on file  . Transportation needs:    Medical: Not on file    Non-medical: Not on file  Tobacco Use  . Smoking status: Current Every Day Smoker    Years: 20.00    Types: Cigarettes    Last attempt to quit: 02/25/2010    Years since quitting: 7.9  . Smokeless tobacco: Never Used  . Tobacco comment: Restarted smoking earlier this year.  Substance and Sexual Activity  . Alcohol use: No    Alcohol/week: 0.0 standard drinks  . Drug use: No  . Sexual activity: Not on file  Lifestyle  . Physical activity:    Days per week: Not on file    Minutes per session: Not on file  . Stress: Not on file  Relationships  . Social connections:    Talks on phone: Not on file    Gets together: Not on file    Attends religious service: Not on file    Active member of club or organization: Not on file    Attends meetings of clubs or organizations: Not on file    Relationship status: Not on file  . Intimate partner violence:    Fear of current or ex partner:  Not on file    Emotionally abused: Not on file    Physically abused: Not on file    Forced sexual activity: Not on file  Other Topics Concern  . Not on file  Social History Narrative   Lives at home with spouse.    Right handed.   Caffeine use: 2 cups coffee/day    8 oz soda/day      Physical Exam  Vital Signs and Nursing Notes reviewed Vitals:   01/25/18 2000 01/25/18 2030  BP: 132/72   Pulse:  64  Resp: 15 11  Temp:    SpO2: 96% 96%    CONSTITUTIONAL: Well-appearing, NAD NEURO:  Alert and oriented x 3, no focal deficits EYES:  eyes equal and reactive ENT/NECK:  no LAD, no JVD CARDIO: Regular rate, well-perfused, normal S1 and S2 PULM:  CTAB no wheezing or rhonchi GI/GU:  normal bowel sounds, non-distended, non-tender MSK/SPINE:  No gross deformities, no edema SKIN:  no rash, atraumatic PSYCH:  Appropriate speech and  behavior  Diagnostic and Interventional Summary    EKG Interpretation  Date/Time:  Sunday January 25 2018 17:24:12 EST Ventricular Rate:  76 PR Interval:    QRS Duration: 87 QT Interval:  435 QTC Calculation: 490 R Axis:   41 Text Interpretation:  Sinus rhythm Low voltage, extremity and precordial leads Nonspecific T abnormalities, lateral leads Borderline prolonged QT interval Baseline wander in lead(s) II aVF V4 V6 Confirmed by Kennis Carina [phone]) on 01/25/2018 6:36:40 PM      Labs Reviewed  BASIC METABOLIC PANEL - Abnormal; Notable for the following components:      Result Value   Potassium 3.3 (*)    Glucose, Bld 131 (*)    Calcium 8.6 (*)    All other components within normal limits  CBC  MAGNESIUM  PHOSPHORUS  TROPONIN I  TROPONIN I  COMPREHENSIVE METABOLIC PANEL  I-STAT TROPONIN, ED    DG Wrist 2 Views Left  Final Result    DG Wrist 2 Views Right  Final Result    DG Chest 2 View  Final Result      Medications  heparin injection 5,000 Units (5,000 Units Subcutaneous Given 01/25/18 2311)  acetaminophen (TYLENOL) tablet 650 mg (has no administration in time range)    Or  acetaminophen (TYLENOL) suppository 650 mg (has no administration in time range)  buprenorphine-naloxone (SUBOXONE) 8-2 mg per SL tablet 1 tablet (has no administration in time range)  cephALEXin (KEFLEX) capsule 500 mg (500 mg Oral Given 01/25/18 2306)  citalopram (CELEXA) tablet 30 mg (has no administration in time range)  clonazePAM (KLONOPIN) tablet 1 mg (1 mg Oral Given 01/25/18 2306)  cloNIDine (CATAPRES) tablet 0.1 mg (has no administration in time range)  hydroxychloroquine (PLAQUENIL) tablet 200 mg (200 mg Oral Not Given 01/25/18 2307)  hydrOXYzine (ATARAX/VISTARIL) tablet 25 mg (has no administration in time range)  simvastatin (ZOCOR) tablet 20 mg (20 mg Oral Given 01/25/18 2306)  nitroGLYCERIN (NITROSTAT) SL tablet 0.4 mg (has no administration in time range)  ketorolac (TORADOL)  15 MG/ML injection 15 mg (has no administration in time range)  nicotine (NICODERM CQ - dosed in mg/24 hours) patch 21 mg (has no administration in time range)  aspirin chewable tablet 162 mg (162 mg Oral Given 01/25/18 1844)  oxyCODONE (Oxy IR/ROXICODONE) immediate release tablet 5 mg (5 mg Oral Given 01/25/18 1859)  potassium chloride SA (K-DUR,KLOR-CON) CR tablet 40 mEq (40 mEq Oral Given 01/25/18 2306)     Procedures Critical  Care  ED Course and Medical Decision Making  I have reviewed the triage vital signs and the nursing notes.  Pertinent labs & imaging results that were available during my care of the patient were reviewed by me and considered in my medical decision making (see below for details).  Concern for unstable angina in this 67 year old female with risk factors for coronary disease but no history, no prior stress testing.  EKG with no ischemic changes, troponin negative.  Will consult hospitalist service for admission for inpatient stress testing.  Admitted to hospitalist service for further care.  Elmer Sow. Pilar Plate, MD [facility] Emergency Medicine [facility] [email or web address]  Final Clinical Impressions(s) / ED Diagnoses     ICD-10-CM   1. Chest pain, unspecified type R07.9   2. Wrist pain M25.539     ED Discharge Orders    None         Sabas Sous, MD 01/25/18 2315

## 2018-01-25 NOTE — ED Notes (Signed)
Bed: WA17 Expected date:  Expected time:  Means of arrival:  Comments: Triage 3 

## 2018-01-25 NOTE — H&P (Signed)
History and Physical    Meghan Schwartz QJF:[id number] DOB: Sep 12, 1950 DOA: 01/25/2018  PCP: Shirlean Mylar, MD   Patient coming from: Home.  I have personally briefly reviewed patient's old medical records in [facility]  Chief Complaint: Chest pain.  HPI: Meghan Schwartz is a 67 y.o. female with medical history significant of anxiety, hyperlipidemia, rheumatoid arthritis, MDD with history of a patent intentional OD back in August this year, history of clonazepam and tramadol misuse who is coming to the emergency department with complaints of 2 chest pain episodes since yesterday.  She states that late afternoon or early evening yesterday she developed a tightness in her chest radiating all the way down to her fingers on her left arm, back and right shoulder/arm.  She felt short of breath, but stated that is slowing down her breathing improved her pain.  There was also lightheadedness with nausea and feelings of a racing heart.  She denies diaphoresis.  Then earlier today, again in late afternoon/early evening, the patient developed a similar episode, but this time the pain was a lot more intense and she had diaphoresis.  She has a history of hyperlipidemia and currently is smoking 20 cigarettes/day.  She has had some lower extremity edema, but this was diagnosed as cellulitis and currently being treated with Keflex.  ED Course: Initial vital signs in the emergency department temperature 98.1 F, pulse 76, respirations 16, blood pressure 171/90 mmHg and O2 sat 98% on room air. The patient received 162 mg of aspirin, oxycodone 5 mg p.o. x1 dose and K-Dur 40 mEq p.o. x1.  Her first troponin was negative.  EKG was sinus rhythm with nonspecific repolarization abnormality and baseline wander in lead V3. Her CBC was normal.  BMP shows a potassium of 3.3 mmol/L, glucose of 131 and calcium 8.6 mg/dL.  All other values are within normal limits.  Magnesium and phosphorus are normal.  Chest radiograph did not show  any active cardiopulmonary pathology.  Review of Systems: As per HPI otherwise 10 point review of systems negative.   Past Medical History:  Diagnosis Date  . Anxiety   . Benzodiazepine dependence (HCC)   . CTS (carpal tunnel syndrome) 06/08/2014  . High cholesterol   . MDD (major depressive disorder), recurrent severe, without psychosis (HCC) 10/02/2017  . RA (rheumatoid arthritis) (HCC)    Past Surgical History:  Procedure Laterality Date  . CESAREAN SECTION  1978. 1983  . FOOT SURGERY Left 2008  . VAGINAL HYSTERECTOMY  2001     reports that she quit smoking about 7 years ago. Her smoking use included cigarettes. She quit after 20.00 years of use. She has never used smokeless tobacco. She reports that she does not drink alcohol or use drugs.  Allergies  Allergen Reactions  . Erythromycin Nausea Only    Family History  Problem Relation Age of Onset  . Thyroid disease Mother   . Heart attack Paternal Grandfather   . Stroke Paternal Grandmother   . Arthritis Maternal Grandmother    Prior to Admission medications   Medication Sig Start Date End Date Taking? Authorizing Provider  Buprenorphine HCl-Naloxone HCl 5.7-1.4 MG SUBL Place 1 tablet under the tongue 3 (three) times daily.   Yes [provider]  cephALEXin (KEFLEX) 500 MG capsule Take 1 capsule (500 mg total) by mouth 3 (three) times daily for 10 days. 01/20/18 01/30/18 Yes Long, Arlyss Repress, MD  citalopram (CELEXA) 10 MG tablet Take 3 tablets (30 mg total) by mouth  daily. 10/06/17  Yes Starkes-Perry, Juel Burrow, FNP  clonazePAM (KLONOPIN) 1 MG tablet Take 1 mg by mouth at bedtime. 12/31/17  Yes [provider]  cloNIDine (CATAPRES) 0.1 MG tablet Take 1 tablet (0.1 mg total) by mouth daily before breakfast. 10/07/17  Yes Starkes-Perry, Juel Burrow, FNP  estradiol (VIVELLE-DOT) 0.075 MG/24HR Place 1 patch onto the skin 2 (two) times a week. Tuesday and Friday   Yes [provider]  etanercept (ENBREL) 50 MG/ML  injection Inject 50 mg into the skin every Friday. Self injection once per week    Yes [provider]  furosemide (LASIX) 20 MG tablet Take 20 mg by mouth daily. 01/13/18  Yes [provider]  hydroxychloroquine (PLAQUENIL) 200 MG tablet Take 200 mg by mouth at bedtime.    Yes [provider]  hydrOXYzine (ATARAX/VISTARIL) 25 MG tablet Take 1 tablet (25 mg total) by mouth every 6 (six) hours as needed for anxiety. 10/05/17  Yes Starkes-Perry, Juel Burrow, FNP  ibuprofen (ADVIL,MOTRIN) 200 MG tablet Take 400 mg by mouth every 6 (six) hours as needed for moderate pain.   Yes [provider]  Multiple Vitamins-Minerals (CENTRUM SILVER ULTRA WOMENS PO) Take 1 tablet by mouth daily.   Yes [provider]  simvastatin (ZOCOR) 20 MG tablet Take 1 tablet (20 mg total) by mouth at bedtime. 10/05/17  Yes Starkes-Perry, Juel Burrow, FNP  Vitamin D, Cholecalciferol, 1000 UNITS CAPS Take 1,000 mg by mouth daily.    Yes [provider]    Physical Exam: Vitals:   01/25/18 1725 01/25/18 1823  BP: (!) 171/90   Pulse: 76   Resp: 16   Temp: 98.1 F (36.7 C)   TempSrc: Oral   SpO2: 98%   Weight:  59 kg  Height:  5\' 4"  (1.626 m)    Constitutional: NAD, calm, comfortable Eyes: PERRL, lids and conjunctivae normal ENMT: Mucous membranes are moist.  Posterior pharynx clear of any exudate or lesions. Neck: normal, supple, no masses, no thyromegaly Respiratory: clear to auscultation bilaterally, no wheezing, no crackles. Normal respiratory effort. No accessory muscle use.  Cardiovascular: Regular rate and rhythm, no murmurs / rubs / gallops. No extremity edema. 2+ pedal pulses. No carotid bruits.  Abdomen: Soft, no tenderness, no masses palpated. No hepatosplenomegaly. Bowel sounds positive.  Musculoskeletal: no clubbing / cyanosis.  Multiple mild MCP joint deformities typical of RA.  Positive tenderness on wrist on passive motion, but otherwise good ROM, no  contractures. Normal muscle tone.  Skin: Positive erythema on pretibial areas, left more than right. Neurologic: CN 2-12 grossly intact. Sensation intact, DTR normal. Strength 5/5 in all 4.  Psychiatric: Normal judgment and insight. Alert and oriented x 4. Normal mood.   Labs on Admission: I have personally reviewed following labs and imaging studies  CBC: Recent Labs  Lab 01/25/18 1757  WBC 4.2  HGB 13.1  HCT 41.9  MCV 87.1  PLT 242   Basic Metabolic Panel: Recent Labs  Lab 01/25/18 1757  NA 137  K 3.3*  CL 100  CO2 29  GLUCOSE 131*  BUN 12  CREATININE 0.65  CALCIUM 8.6*   GFR: Estimated Creatinine Clearance: 58.1 mL/min (by C-G formula based on SCr of 0.65 mg/dL). Liver Function Tests: No results for input(s): AST, ALT, ALKPHOS, BILITOT, PROT, ALBUMIN in the last 168 hours. No results for input(s): LIPASE, AMYLASE in the last 168 hours. No results for input(s): AMMONIA in the last 168 hours. Coagulation Profile: No results for input(s):  INR, PROTIME in the last 168 hours. Cardiac Enzymes: No results for input(s): CKTOTAL, CKMB, CKMBINDEX, TROPONINI in the last 168 hours. BNP (last 3 results) No results for input(s): PROBNP in the last 8760 hours. HbA1C: No results for input(s): HGBA1C in the last 72 hours. CBG: No results for input(s): GLUCAP in the last 168 hours. Lipid Profile: No results for input(s): CHOL, HDL, LDLCALC, TRIG, CHOLHDL, LDLDIRECT in the last 72 hours. Thyroid Function Tests: No results for input(s): TSH, T4TOTAL, FREET4, T3FREE, THYROIDAB in the last 72 hours. Anemia Panel: No results for input(s): VITAMINB12, FOLATE, FERRITIN, TIBC, IRON, RETICCTPCT in the last 72 hours. Urine analysis: No results found for: COLORURINE, APPEARANCEUR, LABSPEC, PHURINE, GLUCOSEU, HGBUR, BILIRUBINUR, KETONESUR, PROTEINUR, UROBILINOGEN, NITRITE, LEUKOCYTESUR  Radiological Exams on Admission: Dg Chest 2 View  Result Date: 01/25/2018 CLINICAL DATA:  Chest  tightness with left arm pain today. EXAM: CHEST - 2 VIEW COMPARISON:  None. FINDINGS: Lungs are adequately inflated without consolidation or effusion. Cardiomediastinal silhouette, bones and soft tissues are normal. IMPRESSION: No active cardiopulmonary disease. Electronically Signed   By: Elberta Fortis M.D.   On: 01/25/2018 18:26    EKG: Independently reviewed.  Sinus rhythm Low voltage, extremity and precordial leads Nonspecific T abnormalities, lateral leads Borderline prolonged QT interval Baseline wander in lead(s) II aVF V4 V6  Assessment/Plan Principal Problem:   Chest pain Observation/telemetry. Supplemental oxygen as needed. Sublingual nitroglycerin as needed for chest pain. Trend troponin levels. Follow-up EKG in a.m. Check echocardiogram in a.m. Advised the patient and her husband to get outpatient stress test at their earlier convenience.  Active Problems:   RA (rheumatoid arthritis) (HCC) Per patient, she thinks she is having a flareup. I will continue Plaquenil 200 mg p.o. at bedtime. Switch ibuprofen to IV Toradol 50 mg every 6 hours as needed.    Hyperlipidemia Continue simvastatin 20 mg p.o. at bedtime. Monitor LFTs as needed. Fasting lipid profile follow-up as an outpatient.    Hypokalemia Replaced. Will add potassium supplementation daily.    Cellulitis of lower extremity Resolving. Continue Keflex.    Depression with anxiety Admitted about 3-1/2 months ago due to MDD relapse/multi-meds intentional OD? The patient has had trouble with benzodiazepine and tramadol use in the past.  She is currently taking Zubsolv 5.7-1.4 mg sublingually 3 times a day. I will continue citalopram 30 mg p.o. Daily. Continue clonazepam at bedtime. Continue hydroxyzine 25 mg p.o. every 6 hours as needed.    DVT prophylaxis: Heparin SQ. Code Status: Full code. Family Communication: Her husband was present in the room. Disposition Plan: Observation for troponin level  trending/cardiac monitoring. Consults called: Admission status: Observation/telemetry.   Bobette Mo MD [facility] Pager [phone].  If 7PM-7AM, please contact night-coverage [email or web address] Password [facility]  01/25/2018, 7:33 PM

## 2018-01-25 NOTE — ED Notes (Signed)
Pt stable at time of transport, denies acute pain.  Pt's husband and all pt belongings accompany pt to floor.

## 2018-01-26 ENCOUNTER — Observation Stay (HOSPITAL_BASED_OUTPATIENT_CLINIC_OR_DEPARTMENT_OTHER): Payer: Medicare Other

## 2018-01-26 ENCOUNTER — Other Ambulatory Visit: Payer: Self-pay

## 2018-01-26 DIAGNOSIS — E785 Hyperlipidemia, unspecified: Secondary | ICD-10-CM

## 2018-01-26 DIAGNOSIS — F418 Other specified anxiety disorders: Secondary | ICD-10-CM | POA: Diagnosis not present

## 2018-01-26 DIAGNOSIS — E876 Hypokalemia: Secondary | ICD-10-CM

## 2018-01-26 DIAGNOSIS — R079 Chest pain, unspecified: Secondary | ICD-10-CM

## 2018-01-26 DIAGNOSIS — M069 Rheumatoid arthritis, unspecified: Secondary | ICD-10-CM

## 2018-01-26 LAB — COMPREHENSIVE METABOLIC PANEL WITH GFR
ALT: 11 U/L (ref 0–44)
AST: 16 U/L (ref 15–41)
Albumin: 3.3 g/dL — ABNORMAL LOW (ref 3.5–5.0)
Alkaline Phosphatase: 95 U/L (ref 38–126)
Anion gap: 7 (ref 5–15)
BUN: 14 mg/dL (ref 8–23)
CO2: 28 mmol/L (ref 22–32)
Calcium: 8.7 mg/dL — ABNORMAL LOW (ref 8.9–10.3)
Chloride: 106 mmol/L (ref 98–111)
Creatinine, Ser: 0.54 mg/dL (ref 0.44–1.00)
GFR calc Af Amer: >60 mL/min
GFR calc non Af Amer: >60 mL/min
Glucose, Bld: 90 mg/dL (ref 70–99)
Potassium: 3.9 mmol/L (ref 3.5–5.1)
Sodium: 141 mmol/L (ref 135–145)
Total Bilirubin: 0.6 mg/dL (ref 0.3–1.2)
Total Protein: 6.3 g/dL — ABNORMAL LOW (ref 6.5–8.1)

## 2018-01-26 LAB — TROPONIN I
Troponin I: 0.03 ng/mL (ref <0.03)
Troponin I: 0.03 ng/mL (ref <0.03)
Troponin I: 0.1 ng/mL (ref <0.03)

## 2018-01-26 LAB — ECHOCARDIOGRAM COMPLETE
Height: 64 in
Weight: 2080 oz

## 2018-01-26 MED ORDER — SODIUM CHLORIDE 0.9 % IV SOLN: 250.0000 mL | INTRAVENOUS | Status: DC | PRN

## 2018-01-26 MED ORDER — SODIUM CHLORIDE 0.9% FLUSH
3.0000 mL | Freq: Two times a day (BID) | INTRAVENOUS | Status: DC
  Administered 2018-01-26: 3 mL via INTRAVENOUS

## 2018-01-26 MED ORDER — LORATADINE 10 MG PO TABS
10.0000 mg | ORAL_TABLET | Freq: Every day | ORAL | Status: DC
  Administered 2018-01-26 – 2018-01-28 (×2): 10 mg via ORAL
  Filled 2018-01-26 (×2): qty 1

## 2018-01-26 MED ORDER — SODIUM CHLORIDE 0.9 % WEIGHT BASED INFUSION
3.0000 mL/kg/h | INTRAVENOUS | Status: DC
  Administered 2018-01-27: 3 mL/kg/h via INTRAVENOUS

## 2018-01-26 MED ORDER — HYDROMORPHONE HCL 1 MG/ML IJ SOLN
1.0000 mg | Freq: Once | INTRAMUSCULAR | Status: AC
  Administered 2018-01-26: 1 mg via INTRAVENOUS
  Filled 2018-01-26: qty 1

## 2018-01-26 MED ORDER — CLONAZEPAM 0.5 MG PO TABS
1.0000 mg | ORAL_TABLET | Freq: Every day | ORAL | Status: DC
  Administered 2018-01-26 – 2018-01-27 (×2): 1 mg via ORAL
  Filled 2018-01-26: qty 2
  Filled 2018-01-26: qty 1

## 2018-01-26 MED ORDER — SODIUM CHLORIDE 0.9% FLUSH: 3.0000 mL | INTRAVENOUS | Status: DC | PRN

## 2018-01-26 MED ORDER — METOPROLOL TARTRATE 5 MG/5ML IV SOLN: 5.0000 mg | INTRAVENOUS | Status: DC | PRN

## 2018-01-26 MED ORDER — ASPIRIN 81 MG PO CHEW
81.0000 mg | CHEWABLE_TABLET | ORAL | Status: AC
  Administered 2018-01-27: 81 mg via ORAL
  Filled 2018-01-26: qty 1

## 2018-01-26 MED ORDER — SODIUM CHLORIDE 0.9 % WEIGHT BASED INFUSION
1.0000 mL/kg/h | INTRAVENOUS | Status: DC
  Administered 2018-01-27: 1 mL/kg/h via INTRAVENOUS
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
## 2018-01-26 NOTE — Progress Notes (Signed)
  Echocardiogram 2D Echocardiogram has been performed.  Meghan Schwartz 01/26/2018, 10:50 AM

## 2018-01-26 NOTE — Progress Notes (Signed)
CRITICAL VALUE STICKER  CRITICAL VALUE: Troponin 0.03  RECEIVER (on-site recipient of call):Chanise Habeck, RN  DATE & TIME NOTIFIED: 01/26/2018 0134  MESSENGER (representative from lab): joyce   MD NOTIFIED: MD Robb Matar made aware  TIME OF NOTIFICATION: 0137  RESPONSE: Spoke with MD, per MD no interventions needed at this time, monitor next troponin and watch for elevation.

## 2018-01-26 NOTE — Progress Notes (Addendum)
Pt called out again c/o of pain 10/10, stating it's unbeaable, left shoulder down to the left arm to the fingers, VS stable, pt does not appear in distress. Tylenol offered-pt refused, pt requesting to page MD, since she cannot have her home medications. MD notified. Order for one time Dilaudid 1mg  IV received.

## 2018-01-26 NOTE — Progress Notes (Signed)
Dilaudid 1 mg  IV administered, education provided pt stated she has not this medicine before, asked "does this last long?" pt tolerated well, pt satisfied with dilaudid stated that she was "finally given something to help her pain." Within an hour patient voiced medication is wearing off. Report given to Marchelle Folks, RN pt care now transferred.

## 2018-01-26 NOTE — Progress Notes (Signed)
After EKG performed pt advised MD will be paged to read EKG and plan of care will go from there, and that pt can have tylenol which she refused; at that time pt asked "can my husband just bring my medicines from home?" This RN went over the use of home meds while in hospital. Pt was told medications brought in need to be sent home, or taken by staff, counted and stored at pharmacy.  Pt stated "do whatever you think is best" MD paged, and made aware.
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
## 2018-01-26 NOTE — Care Management Obs Status (Signed)
MEDICARE OBSERVATION STATUS NOTIFICATION   Patient Details  Name: Meghan Schwartz MRN: [id number] Date of Birth: 1950/06/20   Medicare Observation Status Notification Given:  Yes    [name], RN 01/26/2018, 3:55 PM
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
## 2018-01-26 NOTE — Progress Notes (Signed)
Pt c/o of chest pain, stating pain going down both arms, VS obtained and EKG, MD Robb Matar paged. Will continue to monitor.

## 2018-01-26 NOTE — Progress Notes (Signed)
Night shift telemetry coverage.  The patient complained of chest pain similar to the pain she had earlier after she was given Toradol for her joint pain and asked for more pain medication.  The patient wants to use her home meds and wanted her husband to bring him to the hospital.  We are using equivalent dosage of buprenorphine/naloxone, but instead of 3 times a day, this is twice daily.  Her EKG looks similar to the previous ones.  Another troponin level will be measured.  Sanda Klein, MD.

## 2018-01-27 ENCOUNTER — Encounter (HOSPITAL_COMMUNITY)
Admission: EM | Disposition: A | Discharge status: Home / Self Care | Payer: Self-pay | Source: Home / Self Care | Attending: Family Medicine

## 2018-01-27 ENCOUNTER — Encounter (HOSPITAL_COMMUNITY): Payer: Self-pay | Admitting: Cardiovascular Disease

## 2018-01-27 ENCOUNTER — Other Ambulatory Visit: Payer: Self-pay

## 2018-01-27 DIAGNOSIS — Z8349 Family history of other endocrine, nutritional and metabolic diseases: Secondary | ICD-10-CM | POA: Diagnosis not present

## 2018-01-27 DIAGNOSIS — F418 Other specified anxiety disorders: Secondary | ICD-10-CM | POA: Diagnosis present

## 2018-01-27 DIAGNOSIS — Z823 Family history of stroke: Secondary | ICD-10-CM | POA: Diagnosis not present

## 2018-01-27 DIAGNOSIS — E785 Hyperlipidemia, unspecified: Secondary | ICD-10-CM | POA: Diagnosis present

## 2018-01-27 DIAGNOSIS — M069 Rheumatoid arthritis, unspecified: Secondary | ICD-10-CM | POA: Diagnosis present

## 2018-01-27 DIAGNOSIS — I2510 Atherosclerotic heart disease of native coronary artery without angina pectoris: Secondary | ICD-10-CM | POA: Diagnosis not present

## 2018-01-27 DIAGNOSIS — Z803 Family history of malignant neoplasm of breast: Secondary | ICD-10-CM | POA: Diagnosis not present

## 2018-01-27 DIAGNOSIS — L03116 Cellulitis of left lower limb: Secondary | ICD-10-CM | POA: Diagnosis present

## 2018-01-27 DIAGNOSIS — I214 Non-ST elevation (NSTEMI) myocardial infarction: Secondary | ICD-10-CM | POA: Diagnosis present

## 2018-01-27 DIAGNOSIS — R079 Chest pain, unspecified: Secondary | ICD-10-CM | POA: Diagnosis not present

## 2018-01-27 DIAGNOSIS — Z8261 Family history of arthritis: Secondary | ICD-10-CM | POA: Diagnosis not present

## 2018-01-27 DIAGNOSIS — Z820 Family history of epilepsy and other diseases of the nervous system: Secondary | ICD-10-CM | POA: Diagnosis not present

## 2018-01-27 DIAGNOSIS — F17210 Nicotine dependence, cigarettes, uncomplicated: Secondary | ICD-10-CM | POA: Diagnosis present

## 2018-01-27 DIAGNOSIS — F339 Major depressive disorder, recurrent, unspecified: Secondary | ICD-10-CM | POA: Diagnosis present

## 2018-01-27 DIAGNOSIS — E876 Hypokalemia: Secondary | ICD-10-CM | POA: Diagnosis present

## 2018-01-27 DIAGNOSIS — Z8249 Family history of ischemic heart disease and other diseases of the circulatory system: Secondary | ICD-10-CM | POA: Diagnosis not present

## 2018-01-27 DIAGNOSIS — E782 Mixed hyperlipidemia: Secondary | ICD-10-CM | POA: Diagnosis not present

## 2018-01-27 HISTORY — PX: LEFT HEART CATH AND CORONARY ANGIOGRAPHY: CATH118249

## 2018-01-27 HISTORY — PX: CORONARY STENT INTERVENTION: CATH118234

## 2018-01-27 LAB — BASIC METABOLIC PANEL
Anion gap: 7 (ref 5–15)
BUN: 18 mg/dL (ref 8–23)
CALCIUM: 8.6 mg/dL — AB (ref 8.9–10.3)
CO2: 28 mmol/L (ref 22–32)
Chloride: 106 mmol/L (ref 98–111)
Creatinine, Ser: 0.54 mg/dL (ref 0.44–1.00)
GFR calc Af Amer: >60 mL/min (ref >60)
GFR calc non Af Amer: >60 mL/min (ref >60)
Glucose, Bld: 105 mg/dL — ABNORMAL HIGH (ref 70–99)
Potassium: 3.9 mmol/L (ref 3.5–5.1)
Sodium: 141 mmol/L (ref 135–145)

## 2018-01-27 LAB — POCT ACTIVATED CLOTTING TIME: Activated Clotting Time: 324 seconds

## 2018-01-27 SURGERY — LEFT HEART CATH AND CORONARY ANGIOGRAPHY
Anesthesia: LOCAL

## 2018-01-27 MED ORDER — TICAGRELOR 90 MG PO TABS
ORAL_TABLET | ORAL | Status: DC | PRN
  Administered 2018-01-27: 180 mg via ORAL

## 2018-01-27 MED ORDER — NITROGLYCERIN 1 MG/10 ML FOR IR/CATH LAB
INTRA_ARTERIAL | Status: AC
  Filled 2018-01-27: qty 10

## 2018-01-27 MED ORDER — ACETAMINOPHEN 325 MG PO TABS
ORAL_TABLET | ORAL | Status: AC
  Filled 2018-01-27: qty 2

## 2018-01-27 MED ORDER — LIDOCAINE HCL (PF) 1 % IJ SOLN
INTRAMUSCULAR | Status: AC
  Filled 2018-01-27: qty 30

## 2018-01-27 MED ORDER — HYDRALAZINE HCL 20 MG/ML IJ SOLN
INTRAMUSCULAR | Status: DC | PRN
  Administered 2018-01-27: 20 mg via INTRAVENOUS

## 2018-01-27 MED ORDER — ANGIOPLASTY BOOK
Freq: Once | Status: DC
  Filled 2018-01-27: qty 1

## 2018-01-27 MED ORDER — HEPARIN (PORCINE) IN NACL 1000-0.9 UT/500ML-% IV SOLN
INTRAVENOUS | Status: DC | PRN
  Administered 2018-01-27 (×2): 500 mL

## 2018-01-27 MED ORDER — FENTANYL CITRATE (PF) 100 MCG/2ML IJ SOLN
INTRAMUSCULAR | Status: AC
  Filled 2018-01-27: qty 2

## 2018-01-27 MED ORDER — METOPROLOL TARTRATE 25 MG PO TABS
25.0000 mg | ORAL_TABLET | Freq: Two times a day (BID) | ORAL | Status: DC
  Administered 2018-01-27 – 2018-01-28 (×2): 25 mg via ORAL
  Filled 2018-01-27 (×2): qty 1

## 2018-01-27 MED ORDER — VERAPAMIL HCL 2.5 MG/ML IV SOLN
INTRAVENOUS | Status: DC | PRN
  Administered 2018-01-27: 10 mL via INTRA_ARTERIAL

## 2018-01-27 MED ORDER — HEPARIN SODIUM (PORCINE) 1000 UNIT/ML IJ SOLN
INTRAMUSCULAR | Status: AC
  Filled 2018-01-27: qty 1

## 2018-01-27 MED ORDER — HEPARIN (PORCINE) IN NACL 1000-0.9 UT/500ML-% IV SOLN
INTRAVENOUS | Status: AC
  Filled 2018-01-27: qty 500

## 2018-01-27 MED ORDER — ASPIRIN 81 MG PO CHEW
81.0000 mg | CHEWABLE_TABLET | Freq: Every day | ORAL | Status: DC
  Administered 2018-01-28: 81 mg via ORAL
  Filled 2018-01-27: qty 1

## 2018-01-27 MED ORDER — HEART ATTACK BOUNCING BOOK
Freq: Once | Status: AC
  Administered 2018-01-27: 1
  Filled 2018-01-27: qty 1

## 2018-01-27 MED ORDER — SODIUM CHLORIDE 0.9% FLUSH
3.0000 mL | Freq: Two times a day (BID) | INTRAVENOUS | Status: DC
  Administered 2018-01-28: 11:00:00 3 mL via INTRAVENOUS

## 2018-01-27 MED ORDER — MIDAZOLAM HCL 2 MG/2ML IJ SOLN
INTRAMUSCULAR | Status: AC
  Filled 2018-01-27: qty 2

## 2018-01-27 MED ORDER — HEPARIN SODIUM (PORCINE) 1000 UNIT/ML IJ SOLN
INTRAMUSCULAR | Status: DC | PRN
  Administered 2018-01-27: 3000 [IU] via INTRAVENOUS
  Administered 2018-01-27: 6000 [IU] via INTRAVENOUS

## 2018-01-27 MED ORDER — TICAGRELOR 90 MG PO TABS
90.0000 mg | ORAL_TABLET | Freq: Two times a day (BID) | ORAL | Status: DC
  Administered 2018-01-27 – 2018-01-28 (×2): 90 mg via ORAL
  Filled 2018-01-27 (×2): qty 1

## 2018-01-27 MED ORDER — ONDANSETRON HCL 4 MG/2ML IJ SOLN
4.0000 mg | Freq: Once | INTRAMUSCULAR | Status: AC
  Administered 2018-01-27: 4 mg via INTRAVENOUS

## 2018-01-27 MED ORDER — HYDRALAZINE HCL 20 MG/ML IJ SOLN: 5.0000 mg | INTRAMUSCULAR | Status: AC | PRN

## 2018-01-27 MED ORDER — LABETALOL HCL 5 MG/ML IV SOLN: 10.0000 mg | INTRAVENOUS | Status: AC | PRN

## 2018-01-27 MED ORDER — VERAPAMIL HCL 2.5 MG/ML IV SOLN
INTRAVENOUS | Status: AC
  Filled 2018-01-27: qty 2

## 2018-01-27 MED ORDER — FENTANYL CITRATE (PF) 100 MCG/2ML IJ SOLN
50.0000 ug | Freq: Once | INTRAMUSCULAR | Status: AC
  Administered 2018-01-27: 50 ug via INTRAVENOUS

## 2018-01-27 MED ORDER — MIDAZOLAM HCL 2 MG/2ML IJ SOLN
INTRAMUSCULAR | Status: DC | PRN
  Administered 2018-01-27 (×3): 1 mg via INTRAVENOUS

## 2018-01-27 MED ORDER — THE SENSUOUS HEART BOOK
Freq: Once | Status: AC
  Administered 2018-01-27: 23:00:00 1
  Filled 2018-01-27: qty 1

## 2018-01-27 MED ORDER — LIDOCAINE HCL (PF) 1 % IJ SOLN
INTRAMUSCULAR | Status: DC | PRN
  Administered 2018-01-27: 5 mL

## 2018-01-27 MED ORDER — SODIUM CHLORIDE 0.9 % IV SOLN: INTRAVENOUS | Status: AC

## 2018-01-27 MED ORDER — NITROGLYCERIN 1 MG/10 ML FOR IR/CATH LAB
INTRA_ARTERIAL | Status: DC | PRN
  Administered 2018-01-27: 200 ug via INTRACORONARY

## 2018-01-27 MED ORDER — FENTANYL CITRATE (PF) 100 MCG/2ML IJ SOLN
INTRAMUSCULAR | Status: DC | PRN
  Administered 2018-01-27 (×4): 25 ug via INTRAVENOUS

## 2018-01-27 MED ORDER — SODIUM CHLORIDE 0.9 % IV SOLN: 250.0000 mL | INTRAVENOUS | Status: DC | PRN

## 2018-01-27 MED ORDER — HYDRALAZINE HCL 20 MG/ML IJ SOLN
INTRAMUSCULAR | Status: AC
  Filled 2018-01-27: qty 1

## 2018-01-27 MED ORDER — ONDANSETRON HCL 4 MG/2ML IJ SOLN
INTRAMUSCULAR | Status: AC
  Filled 2018-01-27: qty 2

## 2018-01-27 MED ORDER — TICAGRELOR 90 MG PO TABS
ORAL_TABLET | ORAL | Status: AC
  Filled 2018-01-27: qty 2

## 2018-01-27 MED ORDER — IOHEXOL 350 MG/ML SOLN
INTRAVENOUS | Status: DC | PRN
  Administered 2018-01-27: 100 mL via INTRA_ARTERIAL

## 2018-01-27 MED ORDER — SODIUM CHLORIDE 0.9% FLUSH: 3.0000 mL | INTRAVENOUS | Status: DC | PRN

## 2018-01-27 SURGICAL SUPPLY — 16 items
BALLN SAPPHIRE 2.5X20 (BALLOONS) ×2
BALLN SAPPHIRE ~~LOC~~ 3.75X12 (BALLOONS) ×1 IMPLANT
BALLOON SAPPHIRE 2.5X20 (BALLOONS) IMPLANT
CATH 5FR JL3.5 JR4 ANG PIG MP (CATHETERS) ×1 IMPLANT
CATH VISTA GUIDE 6FR JR4 (CATHETERS) ×1 IMPLANT
DEVICE RAD COMP TR BAND LRG (VASCULAR PRODUCTS) ×1 IMPLANT
GLIDESHEATH SLEND SS 6F .021 (SHEATH) ×1 IMPLANT
GUIDEWIRE INQWIRE 1.5J.035X260 (WIRE) IMPLANT
INQWIRE 1.5J .035X260CM (WIRE) ×2
KIT ENCORE 26 ADVANTAGE (KITS) ×1 IMPLANT
KIT HEART LEFT (KITS) ×2 IMPLANT
PACK CARDIAC CATHETERIZATION (CUSTOM PROCEDURE TRAY) ×2 IMPLANT
STENT SYNERGY DES 3.5X28 (Permanent Stent) ×1 IMPLANT
TRANSDUCER W/STOPCOCK (MISCELLANEOUS) ×2 IMPLANT
TUBING CIL FLEX 10 FLL-RA (TUBING) ×2 IMPLANT
WIRE COUGAR XT STRL 190CM (WIRE) ×1 IMPLANT

## 2018-01-27 NOTE — Progress Notes (Signed)
Patient's husband notified that patient will be staying at [facility] for intervention post-cath. He states he will be by to pick up her belongings. Melton Alar, RN

## 2018-01-27 NOTE — Progress Notes (Signed)
Dr. Clifton James spoke w/patient and husband who.

## 2018-01-27 NOTE — Progress Notes (Signed)
C/O head ache, left arm pain, stomach pain, leg pain. Will notify Dr. Clifton James

## 2018-01-27 NOTE — Progress Notes (Signed)
Dr. Clifton James made aware of c/o left arm pain 9/10. No orders at this time

## 2018-01-27 NOTE — Interval H&P Note (Signed)
History and Physical Interval Note:  01/27/2018 10:59 AM  Meghan Schwartz  has presented today for cardiac cath with the diagnosis of chest pain.  The various methods of treatment have been discussed with the patient and family. After consideration of risks, benefits and other options for treatment, the patient has consented to  Procedure(s): LEFT HEART CATH AND CORONARY ANGIOGRAPHY (N/A) as a surgical intervention .  The patient's history has been reviewed, patient examined, no change in status, stable for surgery.  I have reviewed the patient's chart and labs.  Questions were answered to the patient's satisfaction.    Cath Lab Visit (complete for each Cath Lab visit)  Clinical Evaluation Leading to the Procedure:   ACS: No.  Non-ACS:    Anginal Classification: CCS III  Anti-ischemic medical therapy: No Therapy  Non-Invasive Test Results: No non-invasive testing performed  Prior CABG: No previous CABG     Verne Carrow

## 2018-01-27 NOTE — Progress Notes (Signed)
Patient arrived to holding room from Cath Lab. Report and Care received from [facility]. Rt Radial Site WNL. Palpable pulse to Rt Radial Artery. MD aware of patient pain. Patient received medication in lab for Headache and Arm discomfort. Will continue to monitor patient and pain.

## 2018-01-27 NOTE — Progress Notes (Signed)
Patient arrived from [facility] for Cath Procedure today. Patient resting with no complaints in holding room. Report and care transferred to [facility].

## 2018-01-27 NOTE — Progress Notes (Signed)
Patient's husband picked up her belongings. Melton Alar, RN
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
## 2018-01-28 DIAGNOSIS — E782 Mixed hyperlipidemia: Secondary | ICD-10-CM

## 2018-01-28 LAB — BASIC METABOLIC PANEL
Anion gap: 10 (ref 5–15)
BUN: 13 mg/dL (ref 8–23)
CO2: 28 mmol/L (ref 22–32)
CREATININE: 0.7 mg/dL (ref 0.44–1.00)
Calcium: 9.3 mg/dL (ref 8.9–10.3)
Chloride: 104 mmol/L (ref 98–111)
GFR calc Af Amer: >60 mL/min (ref >60)
GFR calc non Af Amer: >60 mL/min (ref >60)
Glucose, Bld: 88 mg/dL (ref 70–99)
Potassium: 4.8 mmol/L (ref 3.5–5.1)
Sodium: 142 mmol/L (ref 135–145)

## 2018-01-28 LAB — CBC
HCT: 42.3 % (ref 36.0–46.0)
Hemoglobin: 13.1 g/dL (ref 12.0–15.0)
MCH: 27 pg (ref 26.0–34.0)
MCHC: 31 g/dL (ref 30.0–36.0)
MCV: 87 fL (ref 80.0–100.0)
Platelets: 252 10*3/uL (ref 150–400)
RBC: 4.86 MIL/uL (ref 3.87–5.11)
RDW: 12.5 % (ref 11.5–15.5)
WBC: 5.4 10*3/uL (ref 4.0–10.5)
nRBC: 0 % (ref 0.0–0.2)

## 2018-01-28 LAB — LIPID PANEL
CHOLESTEROL: 183 mg/dL (ref 0–200)
HDL: 68 mg/dL (ref >40)
LDL Cholesterol: 99 mg/dL (ref 0–99)
Total CHOL/HDL Ratio: 2.7 RATIO
Triglycerides: 82 mg/dL (ref <150)
VLDL: 16 mg/dL (ref 0–40)

## 2018-01-28 MED ORDER — ONDANSETRON HCL 4 MG/2ML IJ SOLN
INTRAMUSCULAR | Status: AC
  Filled 2018-01-28: qty 2

## 2018-01-28 MED ORDER — ALUM & MAG HYDROXIDE-SIMETH 200-200-20 MG/5ML PO SUSP
30.0000 mL | ORAL | Status: DC | PRN
  Administered 2018-01-28: 30 mL via ORAL
  Filled 2018-01-28: qty 30

## 2018-01-28 MED ORDER — ONDANSETRON HCL 4 MG/2ML IJ SOLN
4.0000 mg | Freq: Four times a day (QID) | INTRAMUSCULAR | Status: DC | PRN
  Administered 2018-01-28 (×2): 4 mg via INTRAVENOUS
  Filled 2018-01-28: qty 2

## 2018-01-28 MED ORDER — ONDANSETRON 4 MG PO TBDP: 4.0000 mg | ORAL_TABLET | Freq: Three times a day (TID) | ORAL | 0 refills | Status: DC | PRN

## 2018-01-28 MED ORDER — ASPIRIN 81 MG PO CHEW: 81.0000 mg | CHEWABLE_TABLET | Freq: Every day | ORAL | 3 refills | Status: DC

## 2018-01-28 MED ORDER — NITROGLYCERIN 0.4 MG SL SUBL: 0.4000 mg | SUBLINGUAL_TABLET | SUBLINGUAL | 3 refills | Status: AC | PRN

## 2018-01-28 MED ORDER — TICAGRELOR 90 MG PO TABS: 90.0000 mg | ORAL_TABLET | Freq: Two times a day (BID) | ORAL | 0 refills | Status: DC

## 2018-01-28 MED ORDER — METOPROLOL TARTRATE 25 MG PO TABS: 25.0000 mg | ORAL_TABLET | Freq: Two times a day (BID) | ORAL | 3 refills | Status: DC

## 2018-01-28 MED ORDER — TICAGRELOR 90 MG PO TABS: 90.0000 mg | ORAL_TABLET | Freq: Two times a day (BID) | ORAL | 6 refills | Status: DC

## 2018-01-28 NOTE — Care Management Note (Addendum)
Case Management Note  Patient Details  Name: Meghan Schwartz MRN: [id number] Date of Birth: 1950/06/02  Subjective/Objective:  From home , s/p stent intervention, will be on brilinta, patient has 30 day savings coupon. ,NCM awaiting benefit check will contact patient when received benefit check , patient phone is [phone] or [phone].   NCM informed patient of zero dollar co pay.                Action/Plan: DC home when ready.  Expected Discharge Date:  01/26/18               Expected Discharge Plan:  Home/Self Care  In-House Referral:     Discharge planning Services  CM Consult, Medication Assistance  Post Acute Care Choice:    Choice offered to:     DME Arranged:    DME Agency:     HH Arranged:    HH Agency:     Status of Service:  Completed, signed off  If discussed at [employer] of Stay Meetings, dates discussed:    Additional Comments:  Leone Haven, RN 01/28/2018, 1:02 PM

## 2018-01-28 NOTE — Progress Notes (Signed)
Progress Note  Patient Name: Meghan Schwartz Date of Encounter: 01/28/2018  Primary Cardiologist:   New   Subjective   No CP   No arm/hand discomflrot   Nauseated this AM    Inpatient Medications    Scheduled Meds: . angioplasty book   Does not apply Once  . aspirin  81 mg Oral Daily  . buprenorphine-naloxone  1 tablet Sublingual BID  . cephALEXin  500 mg Oral TID  . citalopram  30 mg Oral Daily  . clonazePAM  1 mg Oral QHS  . cloNIDine  0.1 mg Oral QAC breakfast  . hydroxychloroquine  200 mg Oral QHS  . loratadine  10 mg Oral Daily  . metoprolol tartrate  25 mg Oral BID  . simvastatin  20 mg Oral QHS  . sodium chloride flush  3 mL Intravenous Q12H  . ticagrelor  90 mg Oral BID   Continuous Infusions: . sodium chloride     PRN Meds: sodium chloride, acetaminophen **OR** acetaminophen, alum & mag hydroxide-simeth, hydrOXYzine, metoprolol tartrate, nicotine, nitroGLYCERIN, ondansetron (ZOFRAN) IV, sodium chloride flush   Vital Signs    Vitals:   01/27/18 1830 01/27/18 2000 01/27/18 2033 01/28/18 0403  BP: 125/62  133/74 (!) 151/95  Pulse:   86 71  Resp: 13 (!) 21  17  Temp:   98 F (36.7 C) 98.3 F (36.8 C)  TempSrc:   Oral Oral  SpO2: 93% 92%  96%  Weight:    59.7 kg  Height:        Intake/Output Summary (Last 24 hours) at 01/28/2018 0825 Last data filed at 01/28/2018 0406 Gross per 24 hour  Intake 1056.25 ml  Output 450 ml  Net 606.25 ml   Filed Weights   01/25/18 1823 01/28/18 0403  Weight: 59 kg 59.7 kg    Telemetry     SR/ST - Personally Reviewed  ECG      Physical Exam   GEN: No acute distress.   Neck: No JVD Cardiac: RRR, no murmurs, rubs, or gallops.  Respiratory: Clear to auscultation bilaterally. GI: Soft, nontender, non-distended  MS: No edema; No deformity.  R wrist soft   Neuro:  Nonfocal  Psych: Normal affect   Labs    Chemistry Recent Labs  Lab 01/26/18 0439 01/27/18 0418 01/28/18 0353  NA 141 141 142  K 3.9 3.9 4.8    CL 106 106 104  CO2 28 28 28   GLUCOSE 90 105* 88  BUN 14 18 13   CREATININE 0.54 0.54 0.70  CALCIUM 8.7* 8.6* 9.3  PROT 6.3*  --   --   ALBUMIN 3.3*  --   --   AST 16  --   --   ALT 11  --   --   ALKPHOS 95  --   --   BILITOT 0.6  --   --   GFRNONAA >60 >60 >60  GFRAA >60 >60 >60  ANIONGAP 7 7 10      Hematology Recent Labs  Lab 01/25/18 1757 01/28/18 0353  WBC 4.2 5.4  RBC 4.81 4.86  HGB 13.1 13.1  HCT 41.9 42.3  MCV 87.1 87.0  MCH 27.2 27.0  MCHC 31.3 31.0  RDW 12.3 12.5  PLT 242 252    Cardiac Enzymes Recent Labs  Lab 01/25/18 2303 01/26/18 0439 01/26/18 1615  TROPONINI 0.03* 0.03* 0.10*    Recent Labs  Lab 01/25/18 1805  TROPIPOC 0.01     BNPNo results for input(s): BNP, PROBNP in  the last 168 hours.   DDimer No results for input(s): DDIMER in the last 168 hours.   Radiology    No results found.  Cardiac Studies   Cath 01/27/18   Prox RCA to Mid RCA lesion is 99% stenosed.  Ost LAD to Prox LAD lesion is 20% stenosed.  Prox LAD lesion is 20% stenosed.  Ost Cx to Prox Cx lesion is 40% stenosed.  Ost 1st Diag lesion is 20% stenosed.  A drug-eluting stent was successfully placed using a STENT SYNERGY DES 3.5X28.  Post intervention, there is a 0% residual stenosis.   1. Severe, ulcerated stenosis mid RCA 2. Mild non-obstructive disease in the LAD and Circumflex 3. Successful PTCA/DES x mid RCA  Recommendations: DAPT with ASA and Brilinta for one year. Continue statin. Will start a beta blocker. Smoking cessation. Likely d/c home in the am if stable.      Patient Profile     67 y.o. female   Assessment & Plan    1  CAD   S/p PTCA/Stent yesterday of RCA   Plan for ASA/Brilinta for 1 year  NTG Rx to take if needed   WOuld ambulate today    IF does OK OK to go home    2   HL    ON statin Simvistatin 20 mg    Will check lipids    If high may need to switch to lipitor or crestor  Goal LDL less than 70    3  Tobacco0  Counselled  on cessation  WIll make sure she has f/u in clinic    For questions or updates, please contact [facility] Please consult [email or web address] for contact info under        Signed, Dietrich Pates, MD  01/28/2018, 8:25 AM

## 2018-01-28 NOTE — Progress Notes (Signed)
TR BAND REMOVAL  LOCATION:    right radial  DEFLATED PER PROTOCOL:    Yes.    TIME BAND OFF / DRESSING APPLIED:    19:00   SITE UPON ARRIVAL:    Level 0  SITE AFTER BAND REMOVAL:    Level 0  CIRCULATION SENSATION AND MOVEMENT:    Within Normal Limits   Yes.    COMMENTS:   Post TR band instructions given. Pt tolerated well. 

## 2018-01-28 NOTE — Care Management (Signed)
#    2.  S/W  ALI @ [facility] #  [phone]   BRILINTA  90 MG BID COVER- YES CO-PAY- ZERO DOLLARS TIER- NO PRIOR APPROVAL- NO  PREFERRED PHARMACY : YES [facility] M/O 90 DAY SUPPLY FOR M/O ZERO DOLLARS

## 2018-01-28 NOTE — Progress Notes (Signed)
Patient left some belongings (1 pair of shoes, clothes) behind at [facility] campus. Patient belongings given to family member [employer].

## 2018-01-28 NOTE — Progress Notes (Signed)
[phone] Pt too nauseated to walk at this time. MI ed began with pt and completed except for ex ed. Pt referred to [place] [facility] 2. Reviewed MI restriction, NTG use, heart healthy food choices, tobacco cessation and importance of brilinta with stent. Needs to see case manager. Pt stated she has quit smoking before with patches and gum. Has been under a lot of stress and has increased smoking this last year. Gave handout and encouraged 1800quitnow. Pt stated nausea becoming worse so I stopped ed. Got pt gingerale and notified RN of increased nausea. Staff can walk later if nausea improves. Luetta Nutting RN BSN 01/28/2018 9:45 AM

## 2018-01-28 NOTE — Evaluation (Signed)
Physical Therapy Evaluation Patient Details Name: Meghan Schwartz MRN: [id number] DOB: [birth year] Today's Date: 01/28/2018   History of Present Illness  Pt is a 67 y/o female presenting with chest pain and tightness, now s/p PTCA/Stent of RCA on 01/27/18. PMH including but not limited to RA, HLD, current smoker and depression.    Clinical Impression  Pt presented supine in bed with HOB elevated, awake and willing to participate in therapy session. Pt's spouse present throughout session as well. Prior to admission, pt reported that she was independent with all functional mobility and ADLs. Pt lives in a two level home with one step to enter with her husband who stated that he could be with her 24/7 to provide supervision/assistance upon d/c. Pt currently able to perform bed mobility at modified independence, transfers with supervision and ambulated in hallway with min guard to min A at times for stability. Pt very limited this session secondary to nausea and one bout of emesis during ambulation. Pt's RN aware. PT will continue to follow acutely to progress mobility as tolerated.     Follow Up Recommendations No PT follow up;Supervision/Assistance - 24 hour;Other (comment)(OP Cardiac Rehab)    Equipment Recommendations  None recommended by PT    Recommendations for Other Services       Precautions / Restrictions Precautions Precautions: Fall      Mobility  Bed Mobility Overal bed mobility: Modified Independent                Transfers Overall transfer level: Needs assistance Equipment used: None Transfers: Sit to/from Stand Sit to Stand: Supervision         General transfer comment: supervision for safety  Ambulation/Gait Ambulation/Gait assistance: Min guard;Min assist Gait Distance (Feet): 100 Feet(~60' then standing rest break +emesis then ~40' to room) Assistive device: 1 person hand held assist Gait Pattern/deviations: Step-through pattern;Decreased stride  length Gait velocity: decreased   General Gait Details: pt with mild instability initially with ambulation, requiring 1 HHA and min A for support; pt initially with very slow gait speed. Pt required one standing rest break to vomit (RN witnessed). After than pt able to ambulate with a more normal gait speed and pattern with overall min guard for safety  Stairs            Wheelchair Mobility    Modified Rankin (Stroke Patients Only)       Balance Overall balance assessment: Needs assistance Sitting-balance support: Feet supported Sitting balance-Leahy Scale: Good     Standing balance support: No upper extremity supported Standing balance-Leahy Scale: Fair                               Pertinent Vitals/Pain Pain Assessment: Faces Faces Pain Scale: Hurts even more Pain Location: headache Pain Descriptors / Indicators: Headache Pain Intervention(s): Monitored during session;Repositioned    Home Living Family/patient expects to be discharged to:: Private residence Living Arrangements: Spouse/significant other Available Help at Discharge: Family;Available 24 hours/day Type of Home: House Home Access: Stairs to enter   [employer] of Steps: 1 Home Layout: Two level;Bed/bath upstairs Home Equipment: None      Prior Function Level of Independence: Independent               Hand Dominance        Extremity/Trunk Assessment   Upper Extremity Assessment Upper Extremity Assessment: Overall WFL for tasks assessed    Lower Extremity Assessment Lower  Extremity Assessment: Overall WFL for tasks assessed       Communication   Communication: No difficulties  Cognition Arousal/Alertness: Awake/alert Behavior During Therapy: WFL for tasks assessed/performed Overall Cognitive Status: Within Functional Limits for tasks assessed                                        General Comments      Exercises      Assessment/Plan    PT Assessment Patient needs continued PT services  PT Problem List Decreased strength;Decreased activity tolerance;Decreased balance;Decreased mobility;Decreased coordination;Decreased knowledge of use of DME;Decreased safety awareness;Decreased knowledge of precautions;Cardiopulmonary status limiting activity       PT Treatment Interventions DME instruction;Gait training;Stair training;Therapeutic activities;Functional mobility training;Therapeutic exercise;Balance training;Neuromuscular re-education;Patient/family education    PT Goals (Current goals can be found in the Care Plan section)  Acute Rehab PT Goals Patient Stated Goal: return home, rest PT Goal Formulation: With patient/family Time For Goal Achievement: 02/11/18 Potential to Achieve Goals: Good    Frequency Min 3X/week   Barriers to discharge        Co-evaluation               AM-PAC PT "6 Clicks" Mobility  Outcome Measure Help needed turning from your back to your side while in a flat bed without using bedrails?: None Help needed moving from lying on your back to sitting on the side of a flat bed without using bedrails?: None Help needed moving to and from a bed to a chair (including a wheelchair)?: A Little Help needed standing up from a chair using your arms (e.g., wheelchair or bedside chair)?: None Help needed to walk in hospital room?: A Little Help needed climbing 3-5 steps with a railing? : A Little 6 Click Score: 21    End of Session Equipment Utilized During Treatment: Gait belt Activity Tolerance: Other (comment)(limited secondary to nausea and emesis during session) Patient left: with family/visitor present;Other (comment)(in bathroom with spouse present) Nurse Communication: Mobility status PT Visit Diagnosis: Other abnormalities of gait and mobility (R26.89)    Time: 8938-1017 PT Time Calculation (min) (ACUTE ONLY): 14 min   Charges:   PT Evaluation $PT Eval  Moderate Complexity: 1 Mod          Deborah Chalk, PT, DPT  Acute Rehabilitation Services Pager [phone] Office [phone]    Alessandra Bevels Maxwel [place]croft 01/28/2018, 11:41 AM
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
## 2018-01-29 ENCOUNTER — Telehealth: Payer: Self-pay | Admitting: Internal Medicine

## 2018-01-29 NOTE — Telephone Encounter (Signed)
New message   Per Hospital discharge papers the patient needs to be seen by Dr. Dietrich Pates by 02/09/2018. There are no new patient openings until Feb. Please advise.

## 2018-01-29 NOTE — Telephone Encounter (Signed)
Will review with Dr. Tenny Craw who saw her before DC. She can see APP since Dr. Tenny Craw does not have openings until March.  Could see Dr. Tenny Craw at next visit.  Will send message back to scheduling to arrange her hospital follow up.

## 2018-02-01 DIAGNOSIS — I219 Acute myocardial infarction, unspecified: Secondary | ICD-10-CM

## 2018-02-01 HISTORY — DX: Acute myocardial infarction, unspecified: I21.9

## 2018-02-09 ENCOUNTER — Telehealth (HOSPITAL_COMMUNITY): Payer: Self-pay

## 2018-02-09 NOTE — Telephone Encounter (Signed)
Pt insurance is active and benefits verified through [facility]. Co-pay $20.00, DED $0.00/$0.00 met, out of pocket $4,000.00/$1,275.31 met, co-insurance 0%. No pre-authorization required. Passport, 02/09/18 @ 12:18PM, REF# [id number]  Will contact patient to see if she is interested in the Cardiac Rehab Program. If interested, patient will need to complete follow up appt. Once completed, patient will be contacted for scheduling upon review by the RN Navigator.

## 2018-02-09 NOTE — Telephone Encounter (Signed)
Attempted to call patient in regards to Cardiac Rehab - LM on VM 

## 2018-03-02 NOTE — Progress Notes (Signed)
Cardiology Office Note:    Date:  03/03/2018   ID:  Meghan Schwartz, DOB Jan 11, 1951, MRN [id number]  PCP:  Shirlean Mylar, MD  Cardiologist:  Dietrich Pates, MD  Electrophysiologist:  None   Referring MD: Shirlean Mylar, MD   Chief Complaint  Patient presents with  . Hospitalization Follow-up    s/p MI    History of Present Illness:    Meghan Schwartz is a 68 y.o. female with RA, hyperlipidemia.  She was admitted 12/1-12/4 with a NSTEMI.  Cardiac Catheterization demonstrated severe ulcerated stenosis in the mid RCA which was treated with a DES.    Meghan Schwartz returns for posthospitalization follow-up.  She is here with her husband.  She has not had any further chest discomfort.  However, she has noted shortness of breath with going up her steps.  She wonders if this is getting worse.  She has not had any orthopnea or paroxysmal nocturnal dyspnea.  She does have lower extremity swelling that has been on going for the last couple of months.  She has been treated for cellulitis several times.  She does note that she had an ultrasound that was negative for DVT.  She denies any bleeding issues.  Prior CV studies:   The following studies were reviewed today:  Cardiac Catheterization 01/27/18 LAD ost 20, prox 20; D1 ost 20 LCx ost 40 RCA prox 99 PCI:  3.5 x 28 mm Synergy DES to proximal RCA  Echo 01/26/18 EF 50-55; prob inf-lat and inf HK  Past Medical History:  Diagnosis Date  . Anxiety   . Benzodiazepine dependence (HCC)   . CTS (carpal tunnel syndrome) 06/08/2014  . High cholesterol   . MDD (major depressive disorder), recurrent severe, without psychosis (HCC) 10/02/2017  . RA (rheumatoid arthritis) (HCC)    Surgical Hx: The patient  has a past surgical history that includes Foot surgery (Left, 2008); Vaginal hysterectomy (2001); Cesarean section (1978. 1983); LEFT HEART CATH AND CORONARY ANGIOGRAPHY (N/A, 01/27/2018); and CORONARY STENT INTERVENTION (N/A, 01/27/2018).   Current  Medications: Current Meds  Medication Sig  . aspirin 81 MG chewable tablet Chew 1 tablet (81 mg total) by mouth daily.  . Buprenorphine HCl-Naloxone HCl 5.7-1.4 MG SUBL Place 1 tablet under the tongue 3 (three) times daily.  . citalopram (CELEXA) 10 MG tablet Take 3 tablets (30 mg total) by mouth daily.  . clonazePAM (KLONOPIN) 1 MG tablet Take 1 mg by mouth at bedtime.  . cloNIDine (CATAPRES) 0.1 MG tablet Take 1 tablet (0.1 mg total) by mouth daily before breakfast.  . estradiol (VIVELLE-DOT) 0.075 MG/24HR Place 1 patch onto the skin 2 (two) times a week. Tuesday and Friday  . etanercept (ENBREL) 50 MG/ML injection Inject 50 mg into the skin every Friday. Self injection once per week   . hydroxychloroquine (PLAQUENIL) 200 MG tablet Take 200 mg by mouth at bedtime.   . hydrOXYzine (ATARAX/VISTARIL) 25 MG tablet Take 1 tablet (25 mg total) by mouth every 6 (six) hours as needed for anxiety.  Marland Kitchen ibuprofen (ADVIL,MOTRIN) 200 MG tablet Take 400 mg by mouth every 6 (six) hours as needed for moderate pain.  . metoprolol tartrate (LOPRESSOR) 25 MG tablet Take 1 tablet (25 mg total) by mouth 2 (two) times daily.  . Multiple Vitamins-Minerals (CENTRUM SILVER ULTRA WOMENS PO) Take 1 tablet by mouth daily.  . nitroGLYCERIN (NITROSTAT) 0.4 MG SL tablet Place 1 tablet (0.4 mg total) under the tongue every 5 (five) minutes as needed for chest  pain.  . ondansetron (ZOFRAN ODT) 4 MG disintegrating tablet Take 1 tablet (4 mg total) by mouth every 8 (eight) hours as needed for nausea or vomiting.  . ticagrelor (BRILINTA) 90 MG TABS tablet Take 1 tablet (90 mg total) by mouth 2 (two) times daily.  . Vitamin D, Cholecalciferol, 1000 UNITS CAPS Take 1,000 mg by mouth daily.   . [DISCONTINUED] simvastatin (ZOCOR) 20 MG tablet Take 1 tablet (20 mg total) by mouth at bedtime.     Allergies:   Erythromycin   Social History   Tobacco Use  . Smoking status: Current Every Day Smoker    Years: 20.00    Types:  Cigarettes    Last attempt to quit: 02/25/2010    Years since quitting: 8.0  . Smokeless tobacco: Never Used  . Tobacco comment: Restarted smoking earlier this year.  Substance Use Topics  . Alcohol use: No    Alcohol/week: 0.0 standard drinks  . Drug use: No     Family Hx: The patient's family history includes Arthritis in her maternal grandmother; Breast cancer in her mother; Heart attack in her paternal grandfather; Parkinson's disease in her father; Raynaud syndrome in her maternal aunt; Stroke in her paternal grandmother; Thyroid disease in her mother.  ROS:   Please see the history of present illness.    ROS All other systems reviewed and are negative.   EKGs/Labs/Other Test Reviewed:    EKG:  EKG is  ordered today.  The ekg ordered today demonstrates sinus bradycardia, heart rate 54, normal axis, nonspecific ST-T wave changes, QTC 455  Recent Labs: 10/04/2017: TSH 1.466 01/25/2018: Magnesium 2.0 01/26/2018: ALT 11 01/28/2018: BUN 13; Creatinine, Ser 0.70; Hemoglobin 13.1; Platelets 252; Potassium 4.8; Sodium 142   Recent Lipid Panel Lab Results  Component Value Date/Time   CHOL 183 01/28/2018 03:53 AM   TRIG 82 01/28/2018 03:53 AM   HDL 68 01/28/2018 03:53 AM   CHOLHDL 2.7 01/28/2018 03:53 AM   LDLCALC 99 01/28/2018 03:53 AM    Physical Exam:    VS:  BP 120/60   Pulse (!) 54   Ht 5\' 4"  (1.626 m)   Wt 134 lb 12.8 oz (61.1 kg)   SpO2 98%   BMI 23.14 kg/m     Wt Readings from Last 3 Encounters:  03/03/18 134 lb 12.8 oz (61.1 kg)  01/28/18 131 lb 9.8 oz (59.7 kg)  01/20/18 130 lb (59 kg)     Physical Exam  Constitutional: She is oriented to person, place, and time. She appears well-developed and well-nourished. No distress.  HENT:  Head: Normocephalic and atraumatic.  Eyes: No scleral icterus.  Neck: Neck supple. No JVD present. No thyromegaly present.  Cardiovascular: Normal rate, regular rhythm, S1 normal, S2 normal and normal heart sounds.  No murmur  heard. Pulmonary/Chest: Effort normal. She has no rales.  Abdominal: Soft. There is no hepatomegaly.  Musculoskeletal:        General: Edema (trace-1+ L LE edema) present.     Comments: Right wrist without hematoma  Lymphadenopathy:    She has no cervical adenopathy.  Neurological: She is alert and oriented to person, place, and time.  Skin: Skin is warm and dry.  Psychiatric: She has a normal mood and affect.    ASSESSMENT & PLAN:    Coronary artery disease Status post recent non-ST elevation myocardial infarction treated with a drug-eluting stent to the RCA.  She is doing well without anginal symptoms.  We discussed the importance of dual  antiplatelet therapy for a minimum of 12 months.  I have encouraged her to proceed with cardiac rehabilitation.  She knows to contact me if she has not heard from them in the next couple of weeks.  Continue aspirin, Brilinta, statin, beta-blocker.  Shortness of breath  Shortness of breath is possibly multifactorial and related to deconditioning as well as side effects from Brilinta.  She does not have any evidence of volume excess on exam.  EF was 50-55% at the time of her echocardiogram post MI.  I will obtain a BMET, BNP today.  If her BNP is significant elevated, I will start her on diuretics with Lasix.  Mixed hyperlipidemia  She has been on simvastatin for years.  LDL was 99 in the hospital.  DC simvastatin.  Start rosuvastatin 20 mg daily.  Obtain lipids and LFTs in 8 to 12 weeks.  Rheumatoid arthritis, involving unspecified site, unspecified rheumatoid factor presence (HCC) Followed by rheumatology.  Dispo:  Return in about 8 weeks (around 04/28/2018) for Routine Follow Up, w/ Dr. Tenny Crawoss.   Medication Adjustments/Labs and Tests Ordered: Current medicines are reviewed at length with the patient today.  Concerns regarding medicines are outlined above.  Tests Ordered: Orders Placed This Encounter  Procedures  . Basic metabolic panel  . Pro b  natriuretic peptide (BNP)  . Lipid panel  . Hepatic function panel  . EKG 12-Lead   Medication Changes: Meds ordered this encounter  Medications  . rosuvastatin (CRESTOR) 20 MG tablet    Sig: Take 1 tablet (20 mg total) by mouth daily.    Dispense:  30 tablet    Refill:  11    Order Specific Question:   Supervising Provider    Answer:   Marinus MawAYLOR, GREGG W [[id number]]    Signed, Tereso NewcomerScott Socorro Ebron, PA-C  03/03/2018 1:29 PM    [facility] [address], [place], [place]  [address] Phone: [phone]; Fax: [phone]) [phone]

## 2018-03-03 ENCOUNTER — Encounter: Payer: Self-pay | Admitting: Physician Assistant

## 2018-03-03 ENCOUNTER — Ambulatory Visit (INDEPENDENT_AMBULATORY_CARE_PROVIDER_SITE_OTHER): Payer: Medicare Other | Admitting: Physician Assistant

## 2018-03-03 VITALS — BP 120/60 | HR 54 | Ht 64.0 in | Wt 134.8 lb

## 2018-03-03 DIAGNOSIS — M069 Rheumatoid arthritis, unspecified: Secondary | ICD-10-CM

## 2018-03-03 DIAGNOSIS — I2510 Atherosclerotic heart disease of native coronary artery without angina pectoris: Secondary | ICD-10-CM | POA: Diagnosis not present

## 2018-03-03 DIAGNOSIS — E782 Mixed hyperlipidemia: Secondary | ICD-10-CM

## 2018-03-03 DIAGNOSIS — R0602 Shortness of breath: Secondary | ICD-10-CM

## 2018-03-03 LAB — BASIC METABOLIC PANEL
BUN/Creatinine Ratio: 22 (ref 12–28)
BUN: 13 mg/dL (ref 8–27)
CO2: 24 mmol/L (ref 20–29)
Calcium: 8.9 mg/dL (ref 8.7–10.3)
Chloride: 100 mmol/L (ref 96–106)
Creatinine, Ser: 0.59 mg/dL (ref 0.57–1.00)
GFR calc Af Amer: 109 mL/min/{1.73_m2} (ref >59)
GFR calc non Af Amer: 94 mL/min/{1.73_m2} (ref >59)
Glucose: 81 mg/dL (ref 65–99)
Potassium: 4 mmol/L (ref 3.5–5.2)
Sodium: 138 mmol/L (ref 134–144)

## 2018-03-03 LAB — PRO B NATRIURETIC PEPTIDE: NT-Pro BNP: 580 pg/mL — ABNORMAL HIGH (ref 0–301)

## 2018-03-03 MED ORDER — ROSUVASTATIN CALCIUM 20 MG PO TABS: 20.0000 mg | ORAL_TABLET | Freq: Every day | ORAL | 11 refills | Status: DC

## 2018-03-03 NOTE — Patient Instructions (Signed)
Medication Instructions:  Your physician has recommended you make the following change in your medication:  1. STOP SIMVASTATIN  2. START ROSUVASTATIN 20 MG DAILY.  If you need a refill on your cardiac medications before your next appointment, please call your pharmacy.   Lab work: TODAY: BMET, BNP  IN 8-12 WEEKS @ APPOINTMENT WITH DR. ROSS: LFTS, LIPIDS  If you have labs (blood work) drawn today and your tests are completely normal, you will receive your results only by: Marland Kitchen MyChart Message (if you have MyChart) OR . A paper copy in the mail If you have any lab test that is abnormal or we need to change your treatment, we will call you to review the results.  Testing/Procedures: NONE  Follow-Up: At [facility], you and your health needs are our priority.  As part of our continuing mission to provide you with exceptional heart care, we have created designated Provider Care Teams.  These Care Teams include your primary Cardiologist (physician) and Advanced Practice Providers (APPs -  Physician Assistants and Nurse Practitioners) who all work together to provide you with the care you need, when you need it. You will need a follow up appointment in:  8 - 12 weeks.  Please call our office 2 months in advance to schedule this appointment.  You may see Dietrich Pates, MD or one of the following Advanced Practice Providers on your designated Care Team: Tereso Newcomer, PA-C [place], [place] . Berton Bon, NP  Any Other Special Instructions Will Be Listed Below (If Applicable).

## 2018-03-04 ENCOUNTER — Telehealth: Payer: Self-pay | Admitting: Physician Assistant

## 2018-03-04 DIAGNOSIS — R0602 Shortness of breath: Secondary | ICD-10-CM

## 2018-03-04 DIAGNOSIS — Z79899 Other long term (current) drug therapy: Secondary | ICD-10-CM

## 2018-03-04 NOTE — Telephone Encounter (Signed)
Patient returned you call for lab results.

## 2018-03-04 NOTE — Telephone Encounter (Signed)
Returned pt call and left message on VM for patient to call office for results.

## 2018-03-05 MED ORDER — FUROSEMIDE 20 MG PO TABS: ORAL_TABLET | ORAL | 3 refills | Status: DC

## 2018-03-05 NOTE — Telephone Encounter (Signed)
Called pt and advised her the changes per Tereso Newcomer based on her BNP... pt verbalized understanding of her instructions and will return on 03/20/18 for repeat labs.  Pt also instructed on K rich foods to eat on the days that she takes her Lasix.

## 2018-03-05 NOTE — Telephone Encounter (Signed)
New message   Patient is returning your call for test results.

## 2018-03-17 ENCOUNTER — Telehealth: Payer: Self-pay

## 2018-03-17 ENCOUNTER — Telehealth (HOSPITAL_COMMUNITY): Payer: Self-pay

## 2018-03-17 NOTE — Telephone Encounter (Signed)
Called patient to see if she was interested in participating in the Cardiac Rehab Program. Patient stated yes. Patient will come in for orientation on 04/21/2018 @ 130PM and will attend the 1:15PM exercise class. Went over insurance, patient verbalized understanding.   Mailed homework package.

## 2018-03-18 NOTE — Telephone Encounter (Signed)
Pt wanted clarification on instructions that were given to her by her PCP re: how to take lasix. She was told to double the MWF lasix 20 mg based on BNP for too much fluid which should help her SOB.  She read this information from notes she wrote from a phone call from Dr. Marland Mcalpine office.  Called Dr. Marland Mcalpine office and asked for recent ov note (03/09/18) and labs be sent to Dr. Charlott Rakes office.  Dr. Marland Mcalpine office reports to me that pt's BNP was 326 and pt was instructed to take lasix 2 tablets on MWF for one week and then go back to usual dose (1 tab every MWF).  Spoke with pts husband who handles her medications. He has been giving her one tablet every other day.  Adv him the correct instructions are for MWF only. He checked the lasix bottle.  The pharmacy dispensed 13 tablets to them, not 30.  So they will check with [facility] for more medication.  Pt is aware she is scheduled for lab work on 03/20/18. She thanked me for helping clarify the above information for her.

## 2018-03-20 ENCOUNTER — Other Ambulatory Visit: Payer: Medicare Other

## 2018-03-20 DIAGNOSIS — R0602 Shortness of breath: Secondary | ICD-10-CM

## 2018-03-20 DIAGNOSIS — Z79899 Other long term (current) drug therapy: Secondary | ICD-10-CM

## 2018-03-21 LAB — BASIC METABOLIC PANEL
BUN/Creatinine Ratio: 19 (ref 12–28)
BUN: 13 mg/dL (ref 8–27)
CO2: 28 mmol/L (ref 20–29)
Calcium: 9.2 mg/dL (ref 8.7–10.3)
Chloride: 97 mmol/L (ref 96–106)
Creatinine, Ser: 0.69 mg/dL (ref 0.57–1.00)
GFR calc Af Amer: 103 mL/min/{1.73_m2} (ref >59)
GFR calc non Af Amer: 90 mL/min/{1.73_m2} (ref >59)
GLUCOSE: 83 mg/dL (ref 65–99)
Potassium: 4 mmol/L (ref 3.5–5.2)
Sodium: 143 mmol/L (ref 134–144)

## 2018-03-27 ENCOUNTER — Telehealth: Payer: Self-pay

## 2018-03-27 NOTE — Telephone Encounter (Signed)
Noted for Scott Weaver, PA 

## 2018-03-27 NOTE — Telephone Encounter (Signed)
-----   Message from Beatrice Lecher, [place] sent at 03/23/2018  1:14 PM EST ----- Renal function, K+ normal.  Recommendations:  - Continue current medications and follow up as planned.   - Please ask patient how she is doing.  Is her shortness of breath any better? Tereso Newcomer, PA-C    03/23/2018 1:12 PM

## 2018-03-27 NOTE — Telephone Encounter (Signed)
Reviewed results with patient who verbalized understanding. Pt states that she is feeling better including her sob. Pt thanked me for the call.

## 2018-04-16 ENCOUNTER — Telehealth (HOSPITAL_COMMUNITY): Payer: Self-pay | Admitting: Pharmacy Technician

## 2018-04-17 NOTE — Progress Notes (Signed)
Meghan Schwartz 68 y.o. female DOB Oct 17, 1950 MRN [id number]       Nutrition Screen Note  No diagnosis found. Past Medical History:  Diagnosis Date  . Anxiety   . Benzodiazepine dependence (HCC)   . CTS (carpal tunnel syndrome) 06/08/2014  . High cholesterol   . MDD (major depressive disorder), recurrent severe, without psychosis (HCC) 10/02/2017  . RA (rheumatoid arthritis) (HCC)    Meds reviewed.    Current Outpatient Medications (Endocrine & Metabolic):  .  estradiol (VIVELLE-DOT) 0.075 MG/24HR, Place 1 patch onto the skin 2 (two) times a week. Tuesday and Friday  Current Outpatient Medications (Cardiovascular):  .  cloNIDine (CATAPRES) 0.1 MG tablet, Take 1 tablet (0.1 mg total) by mouth daily before breakfast. .  furosemide (LASIX) 20 MG tablet, Take on tablet by mouth on Monday, Wednesday, and Friday only. .  metoprolol tartrate (LOPRESSOR) 25 MG tablet, Take 1 tablet (25 mg total) by mouth 2 (two) times daily. .  nitroGLYCERIN (NITROSTAT) 0.4 MG SL tablet, Place 1 tablet (0.4 mg total) under the tongue every 5 (five) minutes as needed for chest pain. .  rosuvastatin (CRESTOR) 20 MG tablet, Take 1 tablet (20 mg total) by mouth daily.   Current Outpatient Medications (Analgesics):  .  aspirin 81 MG chewable tablet, Chew 1 tablet (81 mg total) by mouth daily. .  Buprenorphine HCl-Naloxone HCl 5.7-1.4 MG SUBL, Place 1 tablet under the tongue 3 (three) times daily. Marland Kitchen  etanercept (ENBREL) 50 MG/ML injection, Inject 50 mg into the skin every Friday. Self injection once per week  .  ibuprofen (ADVIL,MOTRIN) 200 MG tablet, Take 400 mg by mouth every 6 (six) hours as needed for moderate pain.  Current Outpatient Medications (Hematological):  .  ticagrelor (BRILINTA) 90 MG TABS tablet, Take 1 tablet (90 mg total) by mouth 2 (two) times daily.  Current Outpatient Medications (Other):  .  citalopram (CELEXA) 10 MG tablet, Take 3 tablets (30 mg total) by mouth daily. .  clonazePAM  (KLONOPIN) 1 MG tablet, Take 1 mg by mouth at bedtime. .  hydroxychloroquine (PLAQUENIL) 200 MG tablet, Take 200 mg by mouth at bedtime.  .  hydrOXYzine (ATARAX/VISTARIL) 25 MG tablet, Take 1 tablet (25 mg total) by mouth every 6 (six) hours as needed for anxiety. .  Multiple Vitamins-Minerals (CENTRUM SILVER ULTRA WOMENS PO), Take 1 tablet by mouth daily. .  ondansetron (ZOFRAN ODT) 4 MG disintegrating tablet, Take 1 tablet (4 mg total) by mouth every 8 (eight) hours as needed for nausea or vomiting. .  Vitamin D, Cholecalciferol, 1000 UNITS CAPS, Take 1,000 mg by mouth daily.    HT: Ht Readings from Last 1 Encounters:  03/03/18 5\' 4"  (1.626 m)    WT: Wt Readings from Last 5 Encounters:  03/03/18 134 lb 12.8 oz (61.1 kg)  01/28/18 131 lb 9.8 oz (59.7 kg)  01/20/18 130 lb (59 kg)  10/01/17 119 lb (54 kg)  06/08/14 146 lb 8 oz (66.5 kg)     BMI = 23.13   Current tobacco use? yes       Labs:  Lipid Panel     Component Value Date/Time   CHOL 183 01/28/2018 0353   TRIG 82 01/28/2018 0353   HDL 68 01/28/2018 0353   CHOLHDL 2.7 01/28/2018 0353   VLDL 16 01/28/2018 0353   LDLCALC 99 01/28/2018 0353    No results found for: HGBA1C CBG (last 3)  No results for input(s): GLUCAP in the last 72 hours.  Nutrition  Diagnosis ? Food-and nutrition-related knowledge deficit related to lack of exposure to information as related to diagnosis of: ? CVD   Nutrition Goal(s):  ? To be determined  Plan:  Pt to attend nutrition classes ? Nutrition I ? Nutrition II ? Portion Distortion  ? Diabetes Blitz ? Diabetes Q & A Will provide client-centered nutrition education as part of interdisciplinary care.   Monitor and evaluate progress toward nutrition goal with team.  Ross Marcus, MS, RD, LDN 04/17/2018 2:29 PM

## 2018-04-18 NOTE — Telephone Encounter (Signed)
Cardiac Rehab - Pharmacy Resident Documentation   Patient unable to be reached after three call attempts. Please complete allergy verification and medication review during patient's cardiac rehab appointment.    Harlow Mares, PharmD PGY1 Pharmacy Resident Phone [phone]  04/18/2018   11:41 AM

## 2018-04-21 ENCOUNTER — Inpatient Hospital Stay (HOSPITAL_COMMUNITY)
Admission: RE | Admit: 2018-04-21 | Discharge: 2018-04-21 | Disposition: A | Discharge status: Home / Self Care | Payer: Self-pay | Source: Ambulatory Visit

## 2018-04-27 ENCOUNTER — Ambulatory Visit (HOSPITAL_COMMUNITY): Payer: Self-pay

## 2018-04-28 ENCOUNTER — Telehealth (HOSPITAL_COMMUNITY): Payer: Self-pay

## 2018-04-28 NOTE — Telephone Encounter (Signed)
Attempted to call patient in regards to Cardiac Rehab - LM on VM 

## 2018-04-29 ENCOUNTER — Ambulatory Visit (HOSPITAL_COMMUNITY): Payer: Self-pay

## 2018-05-01 ENCOUNTER — Ambulatory Visit (HOSPITAL_COMMUNITY): Payer: Self-pay

## 2018-05-04 ENCOUNTER — Telehealth: Payer: Self-pay | Admitting: Neurology

## 2018-05-04 ENCOUNTER — Encounter: Payer: Self-pay | Admitting: *Deleted

## 2018-05-04 ENCOUNTER — Ambulatory Visit (HOSPITAL_COMMUNITY): Payer: Self-pay

## 2018-05-04 ENCOUNTER — Encounter: Payer: Self-pay | Admitting: Neurology

## 2018-05-04 ENCOUNTER — Ambulatory Visit: Payer: Medicare Other | Admitting: Neurology

## 2018-05-04 VITALS — BP 126/74 | HR 63 | Ht 64.0 in | Wt 137.0 lb

## 2018-05-04 DIAGNOSIS — F32A Depression, unspecified: Secondary | ICD-10-CM

## 2018-05-04 DIAGNOSIS — R410 Disorientation, unspecified: Secondary | ICD-10-CM

## 2018-05-04 DIAGNOSIS — F439 Reaction to severe stress, unspecified: Secondary | ICD-10-CM

## 2018-05-04 DIAGNOSIS — R413 Other amnesia: Secondary | ICD-10-CM | POA: Diagnosis not present

## 2018-05-04 DIAGNOSIS — F419 Anxiety disorder, unspecified: Secondary | ICD-10-CM | POA: Diagnosis not present

## 2018-05-04 DIAGNOSIS — F329 Major depressive disorder, single episode, unspecified: Secondary | ICD-10-CM | POA: Diagnosis not present

## 2018-05-04 NOTE — Addendum Note (Signed)
Addended by: Naomie Dean B on: 05/04/2018 09:19 AM   Modules accepted: Orders

## 2018-05-04 NOTE — Telephone Encounter (Signed)
[employer] medicare order sent to [facility]. No auth they will reach out to the pt to schedule.  °
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
## 2018-05-04 NOTE — Patient Instructions (Signed)
MRI of the brain Labs today  Major Depressive Disorder, Adult Major depressive disorder (MDD) is a mental health condition. MDD often makes you feel sad, hopeless, or helpless. MDD can also cause symptoms in your body. MDD can affect your:  Work.  School.  Relationships.  Other normal activities. MDD can range from mild to very bad. It may occur once (single episode MDD). It can also occur many times (recurrent MDD). The main symptoms of MDD often include:  Feeling sad, depressed, or irritable most of the time.  Loss of interest. MDD symptoms also include:  Sleeping too much or too little.  Eating too much or too little.  A change in your weight.  Feeling tired (fatigue) or having low energy.  Feeling worthless.  Feeling guilty.  Trouble making decisions.  Trouble thinking clearly.  Thoughts of suicide or harming others.  Feeling weak.  Feeling agitated.  Keeping yourself from being around other people (isolation). Follow these instructions at home: Activity  Do these things as told by your doctor: ? Go back to your normal activities. ? Exercise regularly. ? Spend time outdoors. Alcohol  Talk with your doctor about how alcohol can affect your antidepressant medicines.  Do not drink alcohol. Or, limit how much alcohol you drink. ? This means no more than 1 drink a day for nonpregnant women and 2 drinks a day for men. One drink equals one of these:  12 oz of beer.  5 oz of wine.  1 oz of hard liquor. General instructions  Take over-the-counter and prescription medicines only as told by your doctor.  Eat a healthy diet.  Get plenty of sleep.  Find activities that you enjoy. Make time to do them.  Think about joining a support group. Your doctor may be able to suggest a group for you.  Keep all follow-up visits as told by your doctor. This is important. Where to find more information:  The [employer] on Mental  Illness: ? www.nami.org  [place] [employer] of Mental Health: ? http://www.maynard.net/  National Suicide Prevention Lifeline: ? 229-757-9534. This is free, 24-hour help. Contact a doctor if:  Your symptoms get worse.  You have new symptoms. Get help right away if:  You self-harm.  You see, hear, taste, smell, or feel things that are not present (hallucinate). If you ever feel like you may hurt yourself or others, or have thoughts about taking your own life, get help right away. You can go to your nearest emergency department or call:  Your local emergency services (911 in the [place]  A suicide crisis helpline, such as the National Suicide Prevention Lifeline: ? 260-068-0841. This is open 24 hours a day. This information is not intended to replace advice given to you by your health care provider. Make sure you discuss any questions you have with your health care provider. Document Released: 01/23/2015 Document Revised: 10/29/2015 Document Reviewed: 10/29/2015 Elsevier Interactive Patient Education  2019 [employer].

## 2018-05-06 ENCOUNTER — Ambulatory Visit (HOSPITAL_COMMUNITY): Payer: Self-pay

## 2018-05-07 ENCOUNTER — Other Ambulatory Visit: Payer: Self-pay | Admitting: Family Medicine

## 2018-05-07 DIAGNOSIS — R1030 Lower abdominal pain, unspecified: Secondary | ICD-10-CM

## 2018-05-07 LAB — BASIC METABOLIC PANEL
BUN/Creatinine Ratio: 21 (ref 12–28)
BUN: 14 mg/dL (ref 8–27)
CO2: 25 mmol/L (ref 20–29)
CREATININE: 0.67 mg/dL (ref 0.57–1.00)
Calcium: 9 mg/dL (ref 8.7–10.3)
Chloride: 101 mmol/L (ref 96–106)
GFR calc Af Amer: 104 mL/min/{1.73_m2} (ref >59)
GFR calc non Af Amer: 91 mL/min/{1.73_m2} (ref >59)
Glucose: 93 mg/dL (ref 65–99)
Potassium: 3.7 mmol/L (ref 3.5–5.2)
Sodium: 140 mmol/L (ref 134–144)

## 2018-05-07 LAB — B12 AND FOLATE PANEL
Folate: >20 ng/mL (ref >3.0)
VITAMIN B 12: 662 pg/mL (ref 232–1245)

## 2018-05-07 LAB — VITAMIN B1: Thiamine: 155.9 nmol/L (ref 66.5–200.0)

## 2018-05-07 LAB — RPR: RPR Ser Ql: NONREACTIVE

## 2018-05-07 LAB — HOMOCYSTEINE: Homocysteine: 7.9 umol/L (ref 0.0–17.2)

## 2018-05-07 LAB — METHYLMALONIC ACID, SERUM: Methylmalonic Acid: 111 nmol/L (ref 0–378)

## 2018-05-07 LAB — TSH: TSH: 2.56 u[IU]/mL (ref 0.450–4.500)

## 2018-05-08 ENCOUNTER — Ambulatory Visit (HOSPITAL_COMMUNITY): Payer: Self-pay

## 2018-05-11 ENCOUNTER — Ambulatory Visit (HOSPITAL_COMMUNITY): Payer: Self-pay

## 2018-05-12 ENCOUNTER — Telehealth: Payer: Self-pay | Admitting: Neurology

## 2018-05-12 NOTE — Telephone Encounter (Signed)
I called the pt back & LVM (ok per DPR) advising pt that if she is ready to schedule her MRI brain she can call [facility] @ [phone]. They have received the order. If she has further questions she is welcome to call our office back. Left office number in message.

## 2018-05-12 NOTE — Telephone Encounter (Signed)
Pt has called to inform that she has made a decision to move forward with  The brain scan.  Pt is asking for a all from RN to discuss

## 2018-05-13 ENCOUNTER — Institutional Professional Consult (permissible substitution): Payer: Medicare Other | Admitting: Neurology

## 2018-05-13 ENCOUNTER — Ambulatory Visit (HOSPITAL_COMMUNITY): Payer: Self-pay

## 2018-05-15 ENCOUNTER — Other Ambulatory Visit: Payer: Self-pay

## 2018-05-15 ENCOUNTER — Ambulatory Visit (INDEPENDENT_AMBULATORY_CARE_PROVIDER_SITE_OTHER): Payer: Medicare Other

## 2018-05-15 ENCOUNTER — Ambulatory Visit (INDEPENDENT_AMBULATORY_CARE_PROVIDER_SITE_OTHER): Payer: Medicare Other | Admitting: Orthopaedic Surgery

## 2018-05-15 ENCOUNTER — Encounter (INDEPENDENT_AMBULATORY_CARE_PROVIDER_SITE_OTHER): Payer: Self-pay | Admitting: Orthopaedic Surgery

## 2018-05-15 ENCOUNTER — Ambulatory Visit (HOSPITAL_COMMUNITY): Payer: Self-pay

## 2018-05-15 VITALS — BP 112/67 | HR 57 | Ht 64.0 in | Wt 130.0 lb

## 2018-05-15 DIAGNOSIS — M25562 Pain in left knee: Secondary | ICD-10-CM

## 2018-05-15 DIAGNOSIS — G8929 Other chronic pain: Secondary | ICD-10-CM

## 2018-05-15 DIAGNOSIS — R2242 Localized swelling, mass and lump, left lower limb: Secondary | ICD-10-CM | POA: Diagnosis not present

## 2018-05-15 NOTE — Progress Notes (Signed)
Office Visit Note   Patient: Meghan Schwartz           Date of Birth: [birth year]           MRN: [id number] Visit Date: 05/15/2018              Requested by: Meghan Mylar, MD [address] [place], [place] [address] PCP: Meghan Mylar, MD   Assessment & Plan: Visit Diagnoses:  1. Chronic pain of left knee   2. Mass of left knee     Plan: Cystic mass lateral aspect left knee.  Long discussion with Meghan Schwartz.  Will order MRI scan.  Most likely would be a candidate for surgical excision  Follow-Up Instructions: No follow-ups on file.   Orders:  Orders Placed This Encounter  Procedures  . XR KNEE 3 VIEW LEFT   No orders of the defined types were placed in this encounter.     Procedures: No procedures performed   Clinical Data: No additional findings.   Subjective: Chief Complaint  Patient presents with  . Left Leg - Pain  Patient presents today for a cyst on her left leg. She said that it is located on the lateral side of her knee. She said that it painful, and makes life difficult. It first appeared about 6 months ago and has continued to grow. She saw Dr.Olin about a month ago and he tried to aspirate it, but was unsuccessful.  Meghan Schwartz has a history of rheumatoid arthritis.  She is taking Enbrel and Plaquenil.  The mass occurred about 6 months ago possibly after an injury continues to "grow".  No numbness or tingling.  HPI  Review of Systems   Objective: Vital Signs: BP 112/67   Pulse (!) 57   Ht 5\' 4"  (1.626 m)   Wt 130 lb (59 kg)   BMI 22.31 kg/m   Physical Exam Constitutional:      Appearance: She is well-developed.  Eyes:     Pupils: Pupils are equal, round, and reactive to light.  Pulmonary:     Effort: Pulmonary effort is normal.  Skin:    General: Skin is warm and dry.  Neurological:     Mental Status: She is alert and oriented to person, place, and time.  Psychiatric:        Behavior: Behavior normal.     Ortho Exam  left knee with a large soft tissue mass along the lateral aspect of the joint line.  Appears to be cystic in nature.  Little if any tenderness.  No knee effusion.  Little bit of patella crepitation.  Normal alignment.  No instability.  Full extension and flexed over 105 degrees.  No distal edema.  Some skin changes along the distal tibia  Specialty Comments:  No specialty comments available.  Imaging: Xr Knee 3 View Left  Result Date: 05/15/2018 Films of the left knee were obtained in 3 projections standing.  There is some mild degenerative changes patellofemoral joint but tickly along the medial facet.  Joint space is well-maintained very mild the medial compartment well with very mild knee joint small peripheral osteophyte laterally.  Minimal subchondral sclerosis and no malalignment.  Those films are consistent with mild to moderate arthritis.  There is a large soft tissue swelling laterally which clinically appears to be a cystic structure    PMFS History: Patient Active Problem List   Diagnosis Date Noted  . Chronic pain of left knee 05/15/2018  .  Mass of left knee 05/15/2018  . Non-ST elevation (NSTEMI) myocardial infarction (HCC)   . Chest pain 01/25/2018  . RA (rheumatoid arthritis) (HCC) 01/25/2018  . Hyperlipidemia 01/25/2018  . Hypokalemia 01/25/2018  . Depression with anxiety 01/25/2018  . Tobacco abuse 01/25/2018  . Lower extremity cellulitis 01/25/2018  . Benzodiazepine dependence (HCC)   . MDD (major depressive disorder), recurrent severe, without psychosis (HCC) 10/02/2017  . CTS (carpal tunnel syndrome) 06/08/2014   Past Medical History:  Diagnosis Date  . Allergic rhinitis   . Anxiety   . Atrial fibrillation (HCC)   . Benzodiazepine dependence (HCC)   . Bruxism   . Chronic foot pain    bunions, torn ligaments-on chronic pain medication  . CTS (carpal tunnel syndrome) 06/08/2014  . Depression   . Heart disease   . High cholesterol   . Hypertension   . MDD  (major depressive disorder), recurrent severe, without psychosis (HCC) 10/02/2017  . Migraine   . RA (rheumatoid arthritis) (HCC)     Family History  Problem Relation Age of Onset  . Thyroid disease Mother   . Breast cancer Mother   . Parkinson's disease Father   . Heart attack Paternal Grandfather   . Stroke Paternal Grandmother   . Arthritis Maternal Grandmother   . Heart attack Maternal Grandmother   . Raynaud syndrome Maternal Aunt     Past Surgical History:  Procedure Laterality Date  . BTL  2000  . CARPAL TUNNEL RELEASE Left   . CESAREAN SECTION  1978. 1983  . CORONARY STENT INTERVENTION N/A 01/27/2018   Procedure: CORONARY STENT INTERVENTION;  Surgeon: Kathleene Hazel, MD;  Location: [facility];  Service: Cardiovascular;  Laterality: N/A;  . eye implants    . FOOT SURGERY Left 2008  . front teeth removed after injury    . LEFT HEART CATH AND CORONARY ANGIOGRAPHY N/A 01/27/2018   Procedure: LEFT HEART CATH AND CORONARY ANGIOGRAPHY;  Surgeon: Kathleene Hazel, MD;  Location: [facility];  Service: Cardiovascular;  Laterality: N/A;  . pre cancerous lesion removed from forehead    . TOTAL ABDOMINAL HYSTERECTOMY W/ BILATERAL SALPINGOOPHORECTOMY  2002   Dr. Jackelyn Knife  . VAGINAL HYSTERECTOMY  2001   Social History   Occupational History  . Occupation: Retired   Tobacco Use  . Smoking status: Current Every Day Smoker    Years: 20.00    Types: Cigarettes    Last attempt to quit: 02/25/2010    Years since quitting: 8.2  . Smokeless tobacco: Never Used  . Tobacco comment: Restarted smoking earlier this year. smokes during stressful time   Substance and Sexual Activity  . Alcohol use: No    Alcohol/week: 0.0 standard drinks  . Drug use: No  . Sexual activity: Not on file

## 2018-05-15 NOTE — Addendum Note (Signed)
Addended by: Wendi Maya on: 05/15/2018 11:47 AM   Modules accepted: Orders

## 2018-05-18 ENCOUNTER — Ambulatory Visit (HOSPITAL_COMMUNITY): Payer: Self-pay

## 2018-05-20 ENCOUNTER — Ambulatory Visit (HOSPITAL_COMMUNITY): Payer: Self-pay

## 2018-05-20 NOTE — Telephone Encounter (Signed)
Noted thank you

## 2018-05-20 NOTE — Telephone Encounter (Signed)
Pt has been informed and she will be calling to schedule her MRI.

## 2018-05-22 ENCOUNTER — Ambulatory Visit (HOSPITAL_COMMUNITY): Payer: Self-pay

## 2018-05-25 ENCOUNTER — Ambulatory Visit (HOSPITAL_COMMUNITY): Payer: Self-pay

## 2018-05-27 ENCOUNTER — Ambulatory Visit (HOSPITAL_COMMUNITY): Payer: Self-pay

## 2018-05-27 ENCOUNTER — Telehealth: Payer: Self-pay

## 2018-05-27 NOTE — Telephone Encounter (Signed)
Called pt to discuss possible e-visit on 06/08/18 with Dr. Tenny Craw in place of OV. Pt declined e-visits and would like to schedule a face to face.

## 2018-05-29 ENCOUNTER — Ambulatory Visit (HOSPITAL_COMMUNITY): Payer: Self-pay

## 2018-06-01 ENCOUNTER — Ambulatory Visit (HOSPITAL_COMMUNITY): Payer: Self-pay

## 2018-06-02 ENCOUNTER — Other Ambulatory Visit: Payer: Self-pay | Admitting: Internal Medicine

## 2018-06-02 ENCOUNTER — Telehealth (INDEPENDENT_AMBULATORY_CARE_PROVIDER_SITE_OTHER): Payer: Self-pay | Admitting: Orthopaedic Surgery

## 2018-06-02 ENCOUNTER — Other Ambulatory Visit: Payer: Self-pay | Admitting: Orthopaedic Surgery

## 2018-06-02 ENCOUNTER — Telehealth: Payer: Self-pay

## 2018-06-02 ENCOUNTER — Other Ambulatory Visit (INDEPENDENT_AMBULATORY_CARE_PROVIDER_SITE_OTHER): Payer: Self-pay | Admitting: Orthopaedic Surgery

## 2018-06-02 DIAGNOSIS — R2242 Localized swelling, mass and lump, left lower limb: Secondary | ICD-10-CM

## 2018-06-02 MED ORDER — METOPROLOL TARTRATE 25 MG PO TABS: 25.0000 mg | ORAL_TABLET | Freq: Two times a day (BID) | ORAL | 2 refills | Status: DC

## 2018-06-02 NOTE — Telephone Encounter (Signed)
Orders have been corrected and reentered.

## 2018-06-02 NOTE — Telephone Encounter (Signed)
Pt's medication was sent to pt's pharmacy as requested. Confirmation received.  °

## 2018-06-02 NOTE — Telephone Encounter (Signed)
    Virtual Visit Pre-Appointment Phone Call  Steps For Call:  1. Confirm consent - "In the setting of the current Covid19 crisis, you are scheduled for a VIDEO visit with your provider on 4/13 at 9:20am.  Just as we do with many in-office visits, in order for you to participate in this visit, we must obtain consent.  If you'd like, I can send this to your mychart (if signed up) or email for you to review.  Otherwise, I can obtain your verbal consent now.  All virtual visits are billed to your insurance company just like a normal visit would be.  By agreeing to a virtual visit, we'd like you to understand that the technology does not allow for your provider to perform an examination, and thus may limit your provider's ability to fully assess your condition.  Finally, though the technology is pretty good, we cannot assure that it will always work on either your or our end, and in the setting of a video visit, we may have to convert it to a phone-only visit.  In either situation, we cannot ensure that we have a secure connection.  Are you willing to proceed?"  2. Give patient instructions for WebEx download to smartphone as below if video visit  3. Advise patient to be prepared with any vital sign or heart rhythm information, their current medicines, and a piece of paper and pen handy for any instructions they may receive the day of their visit  4. Inform patient they will receive a phone call 15 minutes prior to their appointment time (may be from unknown caller ID) so they should be prepared to answer  5. Confirm that appointment type is correct in Epic appointment notes (video vs telephone)    TELEPHONE CALL NOTE  Andersen H Spare has been deemed a candidate for a follow-up tele-health visit to limit community exposure during the Covid-19 pandemic. I spoke with the patient via phone to ensure availability of phone/video source, confirm preferred email & phone number, and discuss instructions and  expectations.  I reminded Kymm Sock Alicia to be prepared with any vital sign and/or heart rhythm information that could potentially be obtained via home monitoring, at the time of her visit. I reminded Kenae Malachi Baize to expect a phone call at the time of her visit if her visit.  Did the patient verbally acknowledge consent to treatment? YES  Leanord Hawking, RN 06/02/2018 8:55 AM       .

## 2018-06-02 NOTE — Telephone Encounter (Signed)
Resend with instructions as above

## 2018-06-02 NOTE — Telephone Encounter (Signed)
Please see below.

## 2018-06-02 NOTE — Telephone Encounter (Signed)
Sarah from [facility] called stating referral needs to say "with and without contrast" due to having a mass.  If you have any questions, please call back at #[phone]

## 2018-06-03 ENCOUNTER — Ambulatory Visit (HOSPITAL_COMMUNITY): Payer: Self-pay

## 2018-06-05 ENCOUNTER — Ambulatory Visit (HOSPITAL_COMMUNITY): Payer: Self-pay

## 2018-06-08 ENCOUNTER — Other Ambulatory Visit: Payer: Self-pay

## 2018-06-08 ENCOUNTER — Encounter: Payer: Self-pay | Admitting: Internal Medicine

## 2018-06-08 ENCOUNTER — Telehealth (INDEPENDENT_AMBULATORY_CARE_PROVIDER_SITE_OTHER): Payer: Self-pay | Admitting: Orthopaedic Surgery

## 2018-06-08 ENCOUNTER — Telehealth: Payer: Self-pay | Admitting: Internal Medicine

## 2018-06-08 ENCOUNTER — Ambulatory Visit (HOSPITAL_COMMUNITY): Payer: Self-pay

## 2018-06-08 ENCOUNTER — Telehealth (INDEPENDENT_AMBULATORY_CARE_PROVIDER_SITE_OTHER): Payer: Medicare Other | Admitting: Internal Medicine

## 2018-06-08 VITALS — Ht 64.0 in | Wt 136.0 lb

## 2018-06-08 DIAGNOSIS — E782 Mixed hyperlipidemia: Secondary | ICD-10-CM

## 2018-06-08 DIAGNOSIS — I480 Paroxysmal atrial fibrillation: Secondary | ICD-10-CM | POA: Diagnosis not present

## 2018-06-08 DIAGNOSIS — I10 Essential (primary) hypertension: Secondary | ICD-10-CM

## 2018-06-08 DIAGNOSIS — I214 Non-ST elevation (NSTEMI) myocardial infarction: Secondary | ICD-10-CM

## 2018-06-08 MED ORDER — FUROSEMIDE 40 MG PO TABS: ORAL_TABLET | ORAL | 0 refills | Status: DC

## 2018-06-08 MED ORDER — POTASSIUM CHLORIDE CRYS ER 20 MEQ PO TBCR: EXTENDED_RELEASE_TABLET | ORAL | 0 refills | Status: DC

## 2018-06-08 NOTE — Progress Notes (Signed)
Virtual Visit via Video Note   This visit type was conducted due to national recommendations for restrictions regarding the COVID-19 Pandemic (e.g. social distancing) in an effort to limit this patient's exposure and mitigate transmission in our community.  Due to her co-morbid illnesses, this patient is at least at moderate risk for complications without adequate follow up.  This format is felt to be most appropriate for this patient at this time.  All issues noted in this document were discussed and addressed.  A limited physical exam was performed with this format.  Please refer to the patient's chart for her consent to telehealth for Meghan Schwartz.   Evaluation Performed:  Follow-up visit  Date:  06/08/2018   ID:  Meghan, Schwartz 07-Oct-1950, MRN [id number]  Patient Location: Home  Provider Location: Home  PCP:  Shirlean Mylar, MD  Cardiologist:  Dietrich Pates, MD Electrophysiologist:  None   Chief Complaint:   History of Present Illness:    Meghan Schwartz is a 68 y.o. female who presents via audio/video conferencing for a telehealth visit today.   The pt is s/p NSTEMI in Dec 2019   Cath showed ulcerated stenosis in mid RCA   Rx with DES stent. She was seen by Wende Mott in January  Since seen the pt says her breathing is better   Gets SOB when excited  Or rushes Has had swelling legs and feet     No fevers Seen by Cleophas Dunker   Has MRI set for June Pt seen by PCP   Placed on doxycycline     She also notes some burning when finishes urinating   The patient does not have symptoms concerning for COVID-19 infection (fever, chills, cough, or new shortness of breath).    Past Medical History:  Diagnosis Date  . Allergic rhinitis   . Anxiety   . Atrial fibrillation (HCC)   . Benzodiazepine dependence (HCC)   . Bruxism   . Chronic foot pain    bunions, torn ligaments-on chronic pain medication  . CTS (carpal tunnel syndrome) 06/08/2014  . Depression   . Heart disease   . High  cholesterol   . Hypertension   . MDD (major depressive disorder), recurrent severe, without psychosis (HCC) 10/02/2017  . Migraine   . RA (rheumatoid arthritis) (HCC)    Past Surgical History:  Procedure Laterality Date  . BTL  2000  . CARPAL TUNNEL RELEASE Left   . CESAREAN SECTION  1978. 1983  . CORONARY STENT INTERVENTION N/A 01/27/2018   Procedure: CORONARY STENT INTERVENTION;  Surgeon: Kathleene Hazel, MD;  Location: [facility];  Service: Cardiovascular;  Laterality: N/A;  . eye implants    . FOOT SURGERY Left 2008  . front teeth removed after injury    . LEFT HEART CATH AND CORONARY ANGIOGRAPHY N/A 01/27/2018   Procedure: LEFT HEART CATH AND CORONARY ANGIOGRAPHY;  Surgeon: Kathleene Hazel, MD;  Location: [facility];  Service: Cardiovascular;  Laterality: N/A;  . pre cancerous lesion removed from forehead    . TOTAL ABDOMINAL HYSTERECTOMY W/ BILATERAL SALPINGOOPHORECTOMY  2002   Dr. Jackelyn Knife  . VAGINAL HYSTERECTOMY  2001     Current Meds  Medication Sig  . Ascorbic Acid (VITAMIN C) 1000 MG tablet Take 1,000 mg by mouth daily.  Marland Kitchen aspirin 81 MG chewable tablet Chew 1 tablet (81 mg total) by mouth daily.  . Buprenorphine HCl-Naloxone HCl 5.7-1.4 MG SUBL Place under the tongue.  Marland Kitchen  Cholecalciferol (VITAMIN D3 PO) Take 2,000 Int'l Units by mouth daily.  . clonazePAM (KLONOPIN) 1 MG tablet Take 1 mg by mouth at bedtime.  . cloNIDine (CATAPRES) 0.1 MG tablet Take 0.1 mg by mouth as directed. Up to three daily as needed  . doxycycline (MONODOX) 100 MG capsule Take 100 mg by mouth 2 (two) times daily.  Marland Kitchen escitalopram (LEXAPRO) 10 MG tablet Take 15 mg by mouth daily.  Marland Kitchen estradiol (VIVELLE-DOT) 0.075 MG/24HR Place 1 patch onto the skin 2 (two) times a week. Tuesday and Friday  . etanercept (ENBREL) 50 MG/ML injection Inject 50 mg into the skin every Friday. Self injection once per week   . fexofenadine (ALLEGRA) 180 MG tablet Take 180 mg by mouth daily.  .  hydroxychloroquine (PLAQUENIL) 200 MG tablet Take 200 mg by mouth 2 (two) times daily.   . hydrOXYzine (ATARAX/VISTARIL) 25 MG tablet Take 1 tablet (25 mg total) by mouth every 6 (six) hours as needed for anxiety.  . metoprolol tartrate (LOPRESSOR) 25 MG tablet Take 1 tablet (25 mg total) by mouth 2 (two) times daily.  . Multiple Vitamins-Minerals (CENTRUM SILVER ULTRA WOMENS PO) Take 1 tablet by mouth daily.  . nitroGLYCERIN (NITROSTAT) 0.4 MG SL tablet Place 1 tablet (0.4 mg total) under the tongue every 5 (five) minutes as needed for chest pain.  . rosuvastatin (CRESTOR) 20 MG tablet Take 1 tablet (20 mg total) by mouth daily.  . ticagrelor (BRILINTA) 90 MG TABS tablet Take 1 tablet (90 mg total) by mouth 2 (two) times daily.     Allergies:   Erythromycin   Social History   Tobacco Use  . Smoking status: Current Every Day Smoker    Years: 20.00    Types: Cigarettes    Last attempt to quit: 02/25/2010    Years since quitting: 8.2  . Smokeless tobacco: Never Used  . Tobacco comment: Restarted smoking earlier this year. smokes during stressful time   Substance Use Topics  . Alcohol use: No    Alcohol/week: 0.0 standard drinks  . Drug use: No     Family Hx: The patient's family history includes Arthritis in her maternal grandmother; Breast cancer in her mother; Heart attack in her maternal grandmother and paternal grandfather; Parkinson's disease in her father; Raynaud syndrome in her maternal aunt; Stroke in her paternal grandmother; Thyroid disease in her mother.  ROS:   Please see the history of present illness.     All other systems reviewed and are negative.   Prior CV studies:   The following studies were reviewed today: Cath  2019  Labs/Other Tests and Data Reviewed:    EKG:  NOt  Done   Recent Labs: 01/25/2018: Magnesium 2.0 01/26/2018: ALT 11 01/28/2018: Hemoglobin 13.1; Platelets 252 03/03/2018: NT-Pro BNP 580 05/04/2018: BUN 14; Creatinine, Ser 0.67; Potassium 3.7;  Sodium 140; TSH 2.560   Recent Lipid Panel Lab Results  Component Value Date/Time   CHOL 183 01/28/2018 03:53 AM   TRIG 82 01/28/2018 03:53 AM   HDL 68 01/28/2018 03:53 AM   CHOLHDL 2.7 01/28/2018 03:53 AM   LDLCALC 99 01/28/2018 03:53 AM    Wt Readings from Last 3 Encounters:  06/08/18 136 lb (61.7 kg)  05/15/18 130 lb (59 kg)  05/04/18 137 lb (62.1 kg)     Objective:    Vital Signs:  Ht 5\' 4"  (1.626 m)   Wt 136 lb (61.7 kg)   BMI 23.34 kg/m    Well nourished, well developed female  in no  acute distress. Edema in legs    Some erythema   ASSESSMENT & PLAN:    1. CAD   No symptoms of angina     Stay on brilinta bid and ASA     2   HTN   BP is OK      3   Edema   Says she is watching salt   Shins are red    I told her to contact Dr Serita Grit office   Let them know that MRI is in June I would also recomm Lasix 40 mg with 20 KCL to take 2x a week  I asked her to call/email in a couple days with response  4   HL   On Crestor    4  COVID-19 Education: The signs and symptoms of COVID-19 were discussed with the patient and how to seek care for testing (follow up with PCP or arrange E-visit).The importance of social distancing was discussed today.  Time:   Today, I have spent 30  minutes with the patient with telehealth technology discussing the above problems.     Medication Adjustments/Labs and Tests Ordered: Current medicines are reviewed at length with the patient today.  Concerns regarding medicines are outlined above.  Tests Ordered: No orders of the defined types were placed in this encounter.  Medication Changes: No orders of the defined types were placed in this encounter.   Disposition:  Follow up July / Aug   2020  SignedDietrich Pates, MD  06/08/2018 9:45 AM    [facility]

## 2018-06-08 NOTE — Telephone Encounter (Signed)
New Message   Pt is calling back to let Dr Tenny Craw know her orthopedic is out of town   Please call back

## 2018-06-08 NOTE — Telephone Encounter (Signed)
Left msg on VM   Pt had televisit scheduled for today   No answer when called Will reschedule

## 2018-06-08 NOTE — Telephone Encounter (Signed)
Patient called stating she had a virtual office visit today 4/13 by phone with Dr. Dietrich Pates her cardiologist.  Patient states Dr. Tenny Craw wanted her to contact Dr. Cleophas Dunker about her left leg swelling and redness and to let him know that her MRI is scheduled in June.  Patient states she has been "seeing a lot of doctors over the past couple of weeks" and wasn't sure if Dr. Tenny Craw wanted her to schedule an appointment with Dr. Cleophas Dunker or just to let him know that her leg is still very swollen and red."

## 2018-06-08 NOTE — Telephone Encounter (Signed)
See note in her chart after she spoke to Dr. Hoy Register office.  They adv her to call us back with update after taking lasix for couple doses per your ov notes.    Pt calling as FYI to let you know the ortho physician is out of town.

## 2018-06-08 NOTE — Telephone Encounter (Signed)
Virtual (phone) visit completed today with Dr. Tenny Craw.

## 2018-06-08 NOTE — Telephone Encounter (Signed)
Spoke to patient and she seemed very confused as to what is going on due to "seeing so many doctors." I reviewed Dr. Charlott Rakes note from today and she advised patient to contact Dr. Cleophas Dunker to let him know that the MRI is scheduled for June. Dr. Tenny Craw started patient on lasix for the edema in her legs.  Patient then asked if the MRI for her brain and the knee MRI could be switched, I advised patient the brain MRI is considered "urgent" as opposed to the knee. Patient then verbalized understanding.  I advised patient to contact Dr. Charlott Rakes office with updates on edema after taking the lasix.

## 2018-06-08 NOTE — Patient Instructions (Signed)
Medication Instructions:  Your physician has recommended you make the following change in your medication:  1.) furosemide (Lasix) 40 mg - take one tablet by mouth twice a week (for example -every Tuesday and Friday) 2.) potassium chloride 20 meq - take one tablet by mouth twice a week.  Take with furosemide Please call or use MyChart to send a message to Dr. Tenny Craw with update on leg swelling/redness in 5-7 days.  If you need a refill on your cardiac medications before your next appointment, please call your pharmacy.   Lab work: none If you have labs (blood work) drawn today and your tests are completely normal, you will receive your results only by: Marland Kitchen MyChart Message (if you have MyChart) OR . A paper copy in the mail If you have any lab test that is abnormal or we need to change your treatment, we will call you to review the results.  Testing/Procedures: none  Follow-Up: At [facility], you and your health needs are our priority.  As part of our continuing mission to provide you with exceptional heart care, we have created designated Provider Care Teams.  These Care Teams include your primary Cardiologist (physician) and Advanced Practice Providers (APPs -  Physician Assistants and Nurse Practitioners) who all work together to provide you with the care you need, when you need it. .   Any Other Special Instructions Will Be Listed Below (If Applicable). Please call or use MyChart to send a message to Dr. Tenny Craw regarding swelling in your legs in about a week (after you've taken the furosemide (lasix) and potassium twice)

## 2018-06-09 NOTE — Telephone Encounter (Signed)
Ask for MRI to be urgent based on mass of unknown type-should be done before June. Any issues with swelling should be addressed by Dr Tenny Craw

## 2018-06-09 NOTE — Telephone Encounter (Signed)
Please advise 

## 2018-06-09 NOTE — Telephone Encounter (Signed)
LMOM

## 2018-06-10 ENCOUNTER — Other Ambulatory Visit: Payer: Self-pay

## 2018-06-10 ENCOUNTER — Ambulatory Visit (HOSPITAL_COMMUNITY): Payer: Self-pay

## 2018-06-11 ENCOUNTER — Other Ambulatory Visit: Payer: Self-pay

## 2018-06-11 ENCOUNTER — Ambulatory Visit
Admission: RE | Admit: 2018-06-11 | Discharge: 2018-06-11 | Disposition: A | Discharge status: Home / Self Care | Payer: Medicare Other | Source: Ambulatory Visit | Attending: Orthopaedic Surgery | Admitting: Orthopaedic Surgery

## 2018-06-11 ENCOUNTER — Inpatient Hospital Stay: Admission: RE | Admit: 2018-06-11 | Payer: Self-pay | Source: Ambulatory Visit

## 2018-06-11 DIAGNOSIS — R2242 Localized swelling, mass and lump, left lower limb: Secondary | ICD-10-CM

## 2018-06-11 MED ORDER — GADOBENATE DIMEGLUMINE 529 MG/ML IV SOLN
12.0000 mL | Freq: Once | INTRAVENOUS | Status: AC | PRN
  Administered 2018-06-11: 12 mL via INTRAVENOUS

## 2018-06-12 ENCOUNTER — Ambulatory Visit (HOSPITAL_COMMUNITY): Payer: Self-pay

## 2018-06-15 ENCOUNTER — Ambulatory Visit (HOSPITAL_COMMUNITY): Payer: Self-pay

## 2018-06-17 ENCOUNTER — Ambulatory Visit (HOSPITAL_COMMUNITY): Payer: Self-pay

## 2018-06-18 ENCOUNTER — Ambulatory Visit (INDEPENDENT_AMBULATORY_CARE_PROVIDER_SITE_OTHER): Payer: Medicare Other | Admitting: Orthopaedic Surgery

## 2018-06-18 ENCOUNTER — Other Ambulatory Visit: Payer: Self-pay

## 2018-06-18 ENCOUNTER — Encounter (INDEPENDENT_AMBULATORY_CARE_PROVIDER_SITE_OTHER): Payer: Self-pay | Admitting: Orthopaedic Surgery

## 2018-06-18 VITALS — BP 109/65 | HR 60 | Ht 64.0 in | Wt 132.0 lb

## 2018-06-18 DIAGNOSIS — R2242 Localized swelling, mass and lump, left lower limb: Secondary | ICD-10-CM | POA: Diagnosis not present

## 2018-06-18 DIAGNOSIS — M25562 Pain in left knee: Secondary | ICD-10-CM

## 2018-06-18 DIAGNOSIS — G8929 Other chronic pain: Secondary | ICD-10-CM

## 2018-06-18 MED ORDER — LIDOCAINE HCL 1 % IJ SOLN
2.0000 mL | INTRAMUSCULAR | Status: AC | PRN
  Administered 2018-06-18: 2 mL

## 2018-06-18 MED ORDER — METHYLPREDNISOLONE ACETATE 40 MG/ML IJ SUSP
80.0000 mg | INTRAMUSCULAR | Status: AC | PRN
  Administered 2018-06-18: 80 mg via INTRA_ARTICULAR

## 2018-06-18 MED ORDER — BUPIVACAINE HCL 0.5 % IJ SOLN
2.0000 mL | INTRAMUSCULAR | Status: AC | PRN
  Administered 2018-06-18: 2 mL via INTRA_ARTICULAR

## 2018-06-18 NOTE — Progress Notes (Signed)
Office Visit Note   Patient: Meghan Schwartz           Date of Birth: 1950/08/13           MRN: [id number] Visit Date: 06/18/2018              Requested by: Shirlean Mylar, MD [address] [place], [place] [address] PCP: Shirlean Mylar, MD   Assessment & Plan: Visit Diagnoses:  1. Chronic pain of left knee   2. Mass of left knee     Plan: MRI scan demonstrates a tear of the medial lateral meniscus associated with tricompartmental degenerative changes particularly in the lateral compartment.  Long discussion with Mr. Mrs. Lafon regarding the pathology in the left knee and the origin of the ganglion cyst.  This may arise from the lateral meniscus.  I aspirated the cyst of thick clear ganglionic fluid and then injected the knee joint.  We will have her return in a month for reevaluation.  We could consider arthroscopic debridement and possibly excision of the cyst.  The big issue is whether or not that would make much of a difference given the fact that she has tricompartmental degenerative change.  We will continue to discuss.  Has history of rheumatoid arthritis taking Plaquenil.  Office visit over 30 minutes 50% of the time in counseling  Follow-Up Instructions: Return in about 1 month (around 07/18/2018).   Orders:  Orders Placed This Encounter  Procedures   Large Joint Inj: L knee   No orders of the defined types were placed in this encounter.     Procedures: Large Joint Inj: L knee on 06/18/2018 9:34 AM Indications: pain and diagnostic evaluation Details: 25 G 1.5 in needle, anteromedial approach  Arthrogram: No  Medications: 2 mL lidocaine 1 %; 2 mL bupivacaine 0.5 %; 80 mg methylPREDNISolone acetate 40 MG/ML Aspirate: 10 mL clear and yellow Outcome: tolerated well, no immediate complications  Aspirated lateral left knee joint ganglion cyst fluid was clear and thick Procedure, treatment alternatives, risks and benefits explained, specific risks discussed.  Consent was given by the patient. Patient was prepped and draped in the usual sterile fashion.       Clinical Data: No additional findings.   Subjective: Chief Complaint  Patient presents with   Left Knee - Follow-up  Patient is presenting today for a follow up on her left knee mass. She had an MRI on 06/11/18. She said that her knee is the same. She does not take anything for pain.  Mr Knee Left W Wo Contrast  Result Date: 06/11/2018 CLINICAL DATA:  Lateral knee mass for the past 6 months. Bilateral lower leg cellulitis. History of rheumatoid arthritis. EXAM: MRI OF THE LEFT KNEE WITHOUT AND WITH CONTRAST TECHNIQUE: Multiplanar, multisequence MR imaging of the left knee was performed both before and after administration of intravenous contrast. CONTRAST:  68mL MULTIHANCE GADOBENATE DIMEGLUMINE 529 MG/ML IV SOLN Creatinine was obtained on site at [facility] at [address] Results: Creatinine 0.7 mg/dL. COMPARISON:  None. FINDINGS: MENISCI Medial meniscus: Degenerated posterior horn with longitudinal tear. Possible additional longitudinal tear of the body. Lateral meniscus:  Degenerated body with suspected horizontal tear. LIGAMENTS Cruciates:  Intact. Collaterals:  Intact. CARTILAGE Patellofemoral: High-grade partial and full-thickness cartilage loss over the majority of the patella and medial trochlea. Medial:  Moderate partial-thickness cartilage loss. Lateral: Moderate partial-thickness cartilage loss centrally. Large areas of full-thickness cartilage loss over the posterior nonweightbearing lateral femoral condyle. MISCELLANEOUS  Joint: Moderate joint effusion with prominent synovitis. Edema within Hoffa's fat. No plical thickening. Popliteal Fossa: Small Baker cyst with synovitis. Intact popliteus tendon. Extensor Mechanism: Intact quadriceps tendon and patellar tendon. Proximal patellar tendinosis. Intact medial and lateral patellar retinaculum. Intact MPFL. Bones: Patchy  marrow edema in the lateral tibial plateau, lateral femoral condyle, and peripheral medial femoral condyle. No erosions. No acute fracture or dislocation. Lateral subluxation of patella. No suspicious bone lesion. Other: Along the lateral knee, there is a 4.0 x 2.7 x 6.6 cm (AP by transverse by CC) complex, heterogeneous cystic lesion without solid enhancement. This lies adjacent to lateral meniscus body. Diffuse nonenhancing soft tissue swelling about the knee, most prominent laterally. IMPRESSION: 1. 4.0 x 2.7 x 6.7 cm complex, heterogeneous cystic lesion along the lateral knee, contacting the lateral meniscus body. Given underlying suspected lateral meniscus horizontal tear, this could represent a large parameniscal cyst. The differential diagnosis also includes a complex ganglion/synovial cyst, and less likely LCL bursitis. 2. Longitudinal tear of the medial meniscus posterior horn. Possible additional longitudinal tear of the body. 3. Moderate tricompartmental osteoarthritis. 4. Moderate joint effusion with prominent synovitis likely related to patient's underlying rheumatoid arthritis. No erosive changes. Electronically Signed   By: Obie Dredge M.D.   On: 06/11/2018 14:14     HPI  Review of Systems   Objective: Vital Signs: BP 109/65    Pulse 60    Ht 5\' 4"  (1.626 m)    Wt 132 lb (59.9 kg)    BMI 22.66 kg/m   Physical Exam Constitutional:      Appearance: She is well-developed.  Eyes:     Pupils: Pupils are equal, round, and reactive to light.  Pulmonary:     Effort: Pulmonary effort is normal.  Skin:    General: Skin is warm and dry.  Neurological:     Mental Status: She is alert and oriented to person, place, and time.  Psychiatric:        Behavior: Behavior normal.     Ortho Exam awake alert and oriented x3.  Examination left knee there was a joint effusion.  Some patellar crepitation.  Full extension.  Large cystic mass lateral aspect of the knee as previously identified.   No redness or erythema.  No ecchymosis.  Minimal discomfort.  Some lateral joint pain.  Flexed over 100 degrees without instability.  No calf pain.  Specialty Comments:  No specialty comments available.  Imaging: No results found.   PMFS History: Patient Active Problem List   Diagnosis Date Noted   Chronic pain of left knee 05/15/2018   Mass of left knee 05/15/2018   Non-ST elevation (NSTEMI) myocardial infarction [facility])    Chest pain 01/25/2018   RA (rheumatoid arthritis) (HCC) 01/25/2018   Hyperlipidemia 01/25/2018   Hypokalemia 01/25/2018   Depression with anxiety 01/25/2018   Tobacco abuse 01/25/2018   Lower extremity cellulitis 01/25/2018   Benzodiazepine dependence (HCC)    MDD (major depressive disorder), recurrent severe, without psychosis (HCC) 10/02/2017   CTS (carpal tunnel syndrome) 06/08/2014   Past Medical History:  Diagnosis Date   Allergic rhinitis    Anxiety    Atrial fibrillation (HCC)    Benzodiazepine dependence (HCC)    Bruxism    Chronic foot pain    bunions, torn ligaments-on chronic pain medication   CTS (carpal tunnel syndrome) 06/08/2014   Depression    Heart disease    High cholesterol    Hypertension    MDD (major depressive  disorder), recurrent severe, without psychosis (HCC) 10/02/2017   Migraine    RA (rheumatoid arthritis) (HCC)     Family History  Problem Relation Age of Onset   Thyroid disease Mother    Breast cancer Mother    Parkinson's disease Father    Heart attack Paternal Grandfather    Stroke Paternal Grandmother    Arthritis Maternal Grandmother    Heart attack Maternal Grandmother    Raynaud syndrome Maternal Aunt     Past Surgical History:  Procedure Laterality Date   BTL  2000   CARPAL TUNNEL RELEASE Left    CESAREAN SECTION  1978. 1983   CORONARY STENT INTERVENTION N/A 01/27/2018   Procedure: CORONARY STENT INTERVENTION;  Surgeon: Kathleene Hazel, MD;  Location: [facility]  INVASIVE CV LAB;  Service: Cardiovascular;  Laterality: N/A;   eye implants     FOOT SURGERY Left 2008   front teeth removed after injury     LEFT HEART CATH AND CORONARY ANGIOGRAPHY N/A 01/27/2018   Procedure: LEFT HEART CATH AND CORONARY ANGIOGRAPHY;  Surgeon: Kathleene Hazel, MD;  Location: [facility] INVASIVE CV LAB;  Service: Cardiovascular;  Laterality: N/A;   pre cancerous lesion removed from forehead     TOTAL ABDOMINAL HYSTERECTOMY W/ BILATERAL SALPINGOOPHORECTOMY  2002   Dr. Jackelyn Knife   VAGINAL HYSTERECTOMY  2001   Social History   Occupational History   Occupation: Retired   Tobacco Use   Smoking status: Current Every Day Smoker    Years: 20.00    Types: Cigarettes    Last attempt to quit: 02/25/2010    Years since quitting: 8.3   Smokeless tobacco: Never Used   Tobacco comment: Restarted smoking earlier this year. smokes during stressful time   Substance and Sexual Activity   Alcohol use: No    Alcohol/week: 0.0 standard drinks   Drug use: No   Sexual activity: Not on file

## 2018-06-19 ENCOUNTER — Ambulatory Visit (HOSPITAL_COMMUNITY): Payer: Self-pay

## 2018-06-22 ENCOUNTER — Ambulatory Visit (HOSPITAL_COMMUNITY): Payer: Self-pay

## 2018-06-24 ENCOUNTER — Ambulatory Visit (HOSPITAL_COMMUNITY): Payer: Self-pay

## 2018-06-26 ENCOUNTER — Ambulatory Visit (HOSPITAL_COMMUNITY): Payer: Self-pay

## 2018-06-29 ENCOUNTER — Ambulatory Visit (HOSPITAL_COMMUNITY): Payer: Self-pay

## 2018-07-01 ENCOUNTER — Ambulatory Visit (HOSPITAL_COMMUNITY): Payer: Self-pay

## 2018-07-03 ENCOUNTER — Ambulatory Visit (HOSPITAL_COMMUNITY): Payer: Self-pay

## 2018-07-06 ENCOUNTER — Ambulatory Visit (HOSPITAL_COMMUNITY): Payer: Self-pay

## 2018-07-08 ENCOUNTER — Ambulatory Visit (HOSPITAL_COMMUNITY): Payer: Self-pay

## 2018-07-10 ENCOUNTER — Ambulatory Visit (HOSPITAL_COMMUNITY): Payer: Self-pay

## 2018-07-13 ENCOUNTER — Ambulatory Visit (HOSPITAL_COMMUNITY): Payer: Self-pay

## 2018-07-14 ENCOUNTER — Other Ambulatory Visit: Payer: Self-pay | Admitting: Internal Medicine

## 2018-07-15 ENCOUNTER — Ambulatory Visit
Admission: RE | Admit: 2018-07-15 | Discharge: 2018-07-15 | Disposition: A | Discharge status: Home / Self Care | Payer: Medicare Other | Source: Ambulatory Visit | Attending: Neurology | Admitting: Neurology

## 2018-07-15 ENCOUNTER — Ambulatory Visit (HOSPITAL_COMMUNITY): Payer: Self-pay

## 2018-07-15 ENCOUNTER — Other Ambulatory Visit: Payer: Self-pay

## 2018-07-15 DIAGNOSIS — F32A Depression, unspecified: Secondary | ICD-10-CM

## 2018-07-15 DIAGNOSIS — F439 Reaction to severe stress, unspecified: Secondary | ICD-10-CM

## 2018-07-15 DIAGNOSIS — R413 Other amnesia: Secondary | ICD-10-CM

## 2018-07-15 DIAGNOSIS — F329 Major depressive disorder, single episode, unspecified: Secondary | ICD-10-CM

## 2018-07-15 DIAGNOSIS — R410 Disorientation, unspecified: Secondary | ICD-10-CM

## 2018-07-15 DIAGNOSIS — F419 Anxiety disorder, unspecified: Secondary | ICD-10-CM

## 2018-07-15 MED ORDER — GADOBENATE DIMEGLUMINE 529 MG/ML IV SOLN
12.0000 mL | Freq: Once | INTRAVENOUS | Status: AC | PRN
  Administered 2018-07-15: 12 mL via INTRAVENOUS

## 2018-07-17 ENCOUNTER — Ambulatory Visit (HOSPITAL_COMMUNITY): Payer: Self-pay

## 2018-07-22 ENCOUNTER — Ambulatory Visit (HOSPITAL_COMMUNITY): Payer: Self-pay

## 2018-07-24 ENCOUNTER — Ambulatory Visit (HOSPITAL_COMMUNITY): Payer: Self-pay

## 2018-07-27 ENCOUNTER — Ambulatory Visit (HOSPITAL_COMMUNITY): Payer: Self-pay

## 2018-07-29 ENCOUNTER — Ambulatory Visit (HOSPITAL_COMMUNITY): Payer: Self-pay

## 2018-07-31 ENCOUNTER — Other Ambulatory Visit: Payer: Self-pay

## 2018-08-02 ENCOUNTER — Other Ambulatory Visit: Payer: Self-pay | Admitting: Internal Medicine

## 2018-08-05 ENCOUNTER — Telehealth: Payer: Self-pay | Admitting: Orthopaedic Surgery

## 2018-08-05 NOTE — Telephone Encounter (Signed)
Please advise 

## 2018-08-05 NOTE — Telephone Encounter (Signed)
Patient left a voicemail stating the cyst on her knee has gotten larger.  Patient is requesting a return call to discuss what to do next.

## 2018-08-06 ENCOUNTER — Telehealth: Payer: Self-pay | Admitting: Physician Assistant

## 2018-08-06 DIAGNOSIS — I2510 Atherosclerotic heart disease of native coronary artery without angina pectoris: Secondary | ICD-10-CM

## 2018-08-06 DIAGNOSIS — R0602 Shortness of breath: Secondary | ICD-10-CM

## 2018-08-06 DIAGNOSIS — M7989 Other specified soft tissue disorders: Secondary | ICD-10-CM

## 2018-08-06 NOTE — Telephone Encounter (Signed)
Over the pst couple weeks increased weight gain. Hasn't had an appetite, trying to drink a lot of fluids Right before heart attack she had a spike in weight. Feels heavy, legs are swollen.  She had a mix up of medication Was on 20 mg lasix twice a week, that wasn't working so increased to 40 mg three times but was still only taking 2x week at first.  Last week she took 3 times. (M-W-F)   Doesn't notice any real change in weight or swelling on days she takes it.  Weight ususally 128-130  Up to 144.  137 today. Breathing -Labored if on feet very long.  A feeling like just can't get enough air, almost panicky.  Okay at night.  No chest pain.  Has VIDEO appointment APP on 6/17. Will route to Dr. Harrington Challenger for recommendations.

## 2018-08-06 NOTE — Telephone Encounter (Signed)
Called-will come to office for another injection

## 2018-08-06 NOTE — Telephone Encounter (Signed)
Set patient up for BMET, BNP, CBC nd TSH tomorrow (FRiday) Will review   Has visit with [name] next week

## 2018-08-06 NOTE — Telephone Encounter (Signed)
New message   Pt c/o swelling: STAT is pt has developed SOB within 24 hours  1) How much weight have you gained and in what time span? Patient states that she has gained 10 lbs within a week   2) If swelling, where is the swelling located? legs  3) Are you currently taking a fluid pill? Yes   4) Are you currently SOB?patient is sob with exertion   5) Do you have a log of your daily weights (if so, list)?no but patient states that she weighs daily   6) Have you gained 3 pounds in a day or 5 pounds in a week? Yes   7) Have you traveled recently? No

## 2018-08-07 NOTE — Telephone Encounter (Signed)
Left message for patient to call back  

## 2018-08-10 ENCOUNTER — Other Ambulatory Visit: Payer: Medicare Other

## 2018-08-10 NOTE — Telephone Encounter (Signed)
Per operator message patient will come today for blood work.

## 2018-08-10 NOTE — Telephone Encounter (Signed)
LATE ENTRY FOR 08/06/18:   Reviewed screening questions with patient on initial phone call on 08/06/18.     COVID-19 Pre-Screening Questions:  . In the past 7 to 10 days have you had a cough,  shortness of breath, headache, congestion, fever (100 or greater) body aches, chills, sore throat, or sudden loss of taste or sense of smell?  NO . Have you been around anyone with known Covid 19.   NO . Have you been around anyone who is awaiting Covid 19 test results in the past 7 to 10 days?  NO . Have you been around anyone who has been exposed to Covid 19, or has mentioned symptoms of Covid 19 within the past 7 to 10 days?  NO  If you have any concerns/questions about symptoms patients report during screening (either on the phone or at threshold). Contact the provider seeing the patient or DOD for further guidance.  If neither are available contact a member of the leadership team.

## 2018-08-11 ENCOUNTER — Other Ambulatory Visit: Payer: Self-pay

## 2018-08-11 ENCOUNTER — Encounter: Payer: Self-pay | Admitting: Orthopaedic Surgery

## 2018-08-11 ENCOUNTER — Ambulatory Visit (INDEPENDENT_AMBULATORY_CARE_PROVIDER_SITE_OTHER): Payer: Medicare Other | Admitting: Orthopaedic Surgery

## 2018-08-11 ENCOUNTER — Other Ambulatory Visit: Payer: Medicare Other

## 2018-08-11 VITALS — BP 113/85 | HR 57 | Ht 64.0 in | Wt 136.0 lb

## 2018-08-11 DIAGNOSIS — I2510 Atherosclerotic heart disease of native coronary artery without angina pectoris: Secondary | ICD-10-CM

## 2018-08-11 DIAGNOSIS — R0602 Shortness of breath: Secondary | ICD-10-CM

## 2018-08-11 DIAGNOSIS — G8929 Other chronic pain: Secondary | ICD-10-CM

## 2018-08-11 DIAGNOSIS — R2242 Localized swelling, mass and lump, left lower limb: Secondary | ICD-10-CM | POA: Diagnosis not present

## 2018-08-11 DIAGNOSIS — M25562 Pain in left knee: Secondary | ICD-10-CM | POA: Diagnosis not present

## 2018-08-11 DIAGNOSIS — E782 Mixed hyperlipidemia: Secondary | ICD-10-CM

## 2018-08-11 DIAGNOSIS — M7989 Other specified soft tissue disorders: Secondary | ICD-10-CM

## 2018-08-11 LAB — CBC
Hematocrit: 38.4 % (ref 34.0–46.6)
Hemoglobin: 12.5 g/dL (ref 11.1–15.9)
MCH: 26.6 pg (ref 26.6–33.0)
MCHC: 32.6 g/dL (ref 31.5–35.7)
MCV: 82 fL (ref 79–97)
Platelets: 196 10*3/uL (ref 150–450)
RBC: 4.7 x10E6/uL (ref 3.77–5.28)
RDW: 13.5 % (ref 11.7–15.4)
WBC: 3.2 10*3/uL — ABNORMAL LOW (ref 3.4–10.8)

## 2018-08-11 LAB — HEPATIC FUNCTION PANEL
ALT: 29 IU/L (ref 0–32)
AST: 39 IU/L (ref 0–40)
Albumin: 4.2 g/dL (ref 3.8–4.8)
Alkaline Phosphatase: 97 IU/L (ref 39–117)
Bilirubin Total: 0.5 mg/dL (ref 0.0–1.2)
Bilirubin, Direct: 0.19 mg/dL (ref 0.00–0.40)
Total Protein: 6.2 g/dL (ref 6.0–8.5)

## 2018-08-11 LAB — BASIC METABOLIC PANEL
BUN/Creatinine Ratio: 24 (ref 12–28)
BUN: 18 mg/dL (ref 8–27)
CO2: 26 mmol/L (ref 20–29)
Calcium: 9.2 mg/dL (ref 8.7–10.3)
Chloride: 102 mmol/L (ref 96–106)
Creatinine, Ser: 0.76 mg/dL (ref 0.57–1.00)
GFR calc Af Amer: 93 mL/min/{1.73_m2} (ref >59)
GFR calc non Af Amer: 80 mL/min/{1.73_m2} (ref >59)
Glucose: 105 mg/dL — ABNORMAL HIGH (ref 65–99)
Potassium: 4.3 mmol/L (ref 3.5–5.2)
Sodium: 141 mmol/L (ref 134–144)

## 2018-08-11 LAB — TSH: TSH: 1.61 u[IU]/mL (ref 0.450–4.500)

## 2018-08-11 LAB — LIPID PANEL
Chol/HDL Ratio: 2 ratio (ref 0.0–4.4)
Cholesterol, Total: 122 mg/dL (ref 100–199)
HDL: 62 mg/dL (ref >39)
LDL Calculated: 46 mg/dL (ref 0–99)
Triglycerides: 71 mg/dL (ref 0–149)
VLDL Cholesterol Cal: 14 mg/dL (ref 5–40)

## 2018-08-11 LAB — PRO B NATRIURETIC PEPTIDE: NT-Pro BNP: 454 pg/mL — ABNORMAL HIGH (ref 0–301)

## 2018-08-11 MED ORDER — METHYLPREDNISOLONE ACETATE 40 MG/ML IJ SUSP
80.0000 mg | INTRAMUSCULAR | Status: AC | PRN
  Administered 2018-08-11: 80 mg via INTRA_ARTICULAR

## 2018-08-11 MED ORDER — BUPIVACAINE HCL 0.5 % IJ SOLN
2.0000 mL | INTRAMUSCULAR | Status: AC | PRN
  Administered 2018-08-11: 2 mL via INTRA_ARTICULAR

## 2018-08-11 MED ORDER — LIDOCAINE HCL 1 % IJ SOLN
2.0000 mL | INTRAMUSCULAR | Status: AC | PRN
  Administered 2018-08-11: 12:00:00 2 mL

## 2018-08-11 NOTE — Progress Notes (Signed)
Office Visit Note   Patient: Meghan Schwartz           Date of Birth: December 06, 1950           MRN: [id number] Visit Date: 08/11/2018              Requested by: Meghan Mylar, MD [address] [place],  [place] [address] PCP: Meghan Mylar, MD   Assessment & Plan: Visit Diagnoses:  1. Chronic pain of left knee   2. Mass of left knee     Plan: Meghan Schwartz has had a prior MRI scan of her left knee demonstrating a 4 x 2.7 x 6.7 cm complex heterogeneous cystic lesion consistent with a para meniscal cyst.  This is been aspirated in the past only to have it recur.  She also has evidence of osteoarthritis in all 3 compartments.  Long discussion with Meghan Schwartz regarding her knee.  I will re-aspirate the mass and inject cortisone.  The next step I believe would be a knee arthroscopy and excision of the meniscal cyst.  She does have significant degenerative change and at some point might be a candidate for knee replacement.  Follow-Up Instructions: Return if symptoms worsen or fail to improve.   Orders:  Orders Placed This Encounter  Procedures  . Large Joint Inj: L knee   No orders of the defined types were placed in this encounter.     Procedures: Large Joint Inj: L knee on 08/11/2018 11:32 AM Indications: pain and diagnostic evaluation Details: 25 G 1.5 in needle, anteromedial approach  Arthrogram: No  Medications: 2 mL lidocaine 1 %; 2 mL bupivacaine 0.5 %; 80 mg methylPREDNISolone acetate 40 MG/ML Aspirate: 15 mL blood-tinged Outcome: tolerated well, no immediate complications  Aspirated 3 cm mass lateral joint line left knee.  Fluid was consistent with ganglionic fluid.  I injected 80 mg Depo-Medrol. Procedure, treatment alternatives, risks and benefits explained, specific risks discussed. Consent was given by the patient. Patient was prepped and draped in the usual sterile fashion.       Clinical Data: No additional findings.   Subjective: Chief Complaint   Patient presents with  . Left Knee - Pain  Patient presents today for her left knee. She was last seen on 06/18/18 and had  her lateral cyst on her knee aspirated and then her knee injected with cortisone. She said that her knee felt great afterwards for a few weeks. It has since came back and hurts again behind the cyst. She does not take anything for pain.   HPI  Review of Systems  Constitutional: Positive for fatigue.  HENT: Negative for ear pain.   Eyes: Negative for pain.  Respiratory: Positive for shortness of breath.   Cardiovascular: Positive for leg swelling.  Gastrointestinal: Negative for constipation and diarrhea.  Endocrine: Negative for cold intolerance and heat intolerance.  Genitourinary: Negative for difficulty urinating.  Musculoskeletal: Negative for joint swelling.  Skin: Negative for rash.  Allergic/Immunologic: Negative for food allergies.  Neurological: Positive for weakness.  Hematological: Does not bruise/bleed easily.  Psychiatric/Behavioral: Negative for sleep disturbance.     Objective: Vital Signs: BP 113/85   Pulse (!) 57   Ht 5\' 4"  (1.626 m)   Wt 136 lb (61.7 kg)   BMI 23.34 kg/m   Physical Exam Constitutional:      Appearance: She is well-developed.  Eyes:     Pupils: Pupils are equal, round, and reactive to light.  Pulmonary:  Effort: Pulmonary effort is normal.  Skin:    General: Skin is warm and dry.  Neurological:     Mental Status: She is alert and oriented to person, place, and time.  Psychiatric:        Behavior: Behavior normal.     Ortho Exam left knee with possibly very small effusion.  Large cystic lesion along the lateral joint line that has not changed in appearance since her last office visit.  It is fluctuant and nontender.  Full extension of her knee and over 100 degrees of flexion without instability.  Some patellar crepitation.  No medial joint pain.  Specialty Comments:  No specialty comments available.   Imaging: No results found.   PMFS History: Patient Active Problem List   Diagnosis Date Noted  . Chronic pain of left knee 05/15/2018  . Mass of left knee 05/15/2018  . Non-ST elevation (NSTEMI) myocardial infarction ([place])   . Chest pain 01/25/2018  . RA (rheumatoid arthritis) ([place]) 01/25/2018  . Hyperlipidemia 01/25/2018  . Hypokalemia 01/25/2018  . Depression with anxiety 01/25/2018  . Tobacco abuse 01/25/2018  . Lower extremity cellulitis 01/25/2018  . Benzodiazepine dependence ([place])   . MDD (major depressive disorder), recurrent severe, without psychosis ([place]) 10/02/2017  . CTS (carpal tunnel syndrome) 06/08/2014   Past Medical History:  Diagnosis Date  . Allergic rhinitis   . Anxiety   . Atrial fibrillation ([place])   . Benzodiazepine dependence ([place])   . Bruxism   . Chronic foot pain    bunions, torn ligaments-on chronic pain medication  . CTS (carpal tunnel syndrome) 06/08/2014  . Depression   . Heart disease   . High cholesterol   . Hypertension   . MDD (major depressive disorder), recurrent severe, without psychosis ([place]) 10/02/2017  . Migraine   . RA (rheumatoid arthritis) (HCC)     Family History  Problem Relation Age of Onset  . Thyroid disease Mother   . Breast cancer Mother   . Parkinson's disease Father   . Heart attack Paternal Grandfather   . Stroke Paternal Grandmother   . Arthritis Maternal Grandmother   . Heart attack Maternal Grandmother   . Raynaud syndrome Maternal Aunt     Past Surgical History:  Procedure Laterality Date  . BTL  2000  . CARPAL TUNNEL RELEASE Left   . [place]  . CORONARY STENT INTERVENTION N/A 01/27/2018   Procedure: CORONARY STENT INTERVENTION;  Surgeon: Burnell Blanks, MD;  Location: [facility];  Service: Cardiovascular;  Laterality: N/A;  . eye implants    . FOOT SURGERY Left 2008  . front teeth removed after injury    . LEFT HEART CATH AND CORONARY ANGIOGRAPHY N/A 01/27/2018    Procedure: LEFT HEART CATH AND CORONARY ANGIOGRAPHY;  Surgeon: Burnell Blanks, MD;  Location: [facility];  Service: Cardiovascular;  Laterality: N/A;  . pre cancerous lesion removed from forehead    . TOTAL ABDOMINAL HYSTERECTOMY W/ BILATERAL SALPINGOOPHORECTOMY  2002   Dr. Willis Modena  . VAGINAL HYSTERECTOMY  2001   Social History   Occupational History  . Occupation: Retired   Tobacco Use  . Smoking status: Current Every Day Smoker    Years: 20.00    Types: Cigarettes    Last attempt to quit: 02/25/2010    Years since quitting: 8.4  . Smokeless tobacco: Never Used  . Tobacco comment: Restarted smoking earlier this year. smokes during stressful time   Substance and Sexual  Activity  . Alcohol use: No    Alcohol/week: 0.0 standard drinks  . Drug use: No  . Sexual activity: Not on file

## 2018-08-12 ENCOUNTER — Telehealth: Payer: Self-pay

## 2018-08-12 ENCOUNTER — Encounter: Payer: Self-pay | Admitting: Physician Assistant

## 2018-08-12 ENCOUNTER — Telehealth (INDEPENDENT_AMBULATORY_CARE_PROVIDER_SITE_OTHER): Payer: Medicare Other | Admitting: Physician Assistant

## 2018-08-12 VITALS — BP 113/87 | Ht 64.0 in | Wt 137.0 lb

## 2018-08-12 DIAGNOSIS — I2510 Atherosclerotic heart disease of native coronary artery without angina pectoris: Secondary | ICD-10-CM | POA: Diagnosis not present

## 2018-08-12 DIAGNOSIS — M7989 Other specified soft tissue disorders: Secondary | ICD-10-CM | POA: Diagnosis not present

## 2018-08-12 DIAGNOSIS — E782 Mixed hyperlipidemia: Secondary | ICD-10-CM | POA: Diagnosis not present

## 2018-08-12 DIAGNOSIS — Z7189 Other specified counseling: Secondary | ICD-10-CM

## 2018-08-12 DIAGNOSIS — R0602 Shortness of breath: Secondary | ICD-10-CM | POA: Diagnosis not present

## 2018-08-12 DIAGNOSIS — M069 Rheumatoid arthritis, unspecified: Secondary | ICD-10-CM

## 2018-08-12 MED ORDER — TICAGRELOR 90 MG PO TABS: 90.0000 mg | ORAL_TABLET | Freq: Two times a day (BID) | ORAL | 3 refills | Status: DC

## 2018-08-12 MED ORDER — POTASSIUM CHLORIDE CRYS ER 20 MEQ PO TBCR: EXTENDED_RELEASE_TABLET | ORAL | 11 refills | Status: DC

## 2018-08-12 MED ORDER — FUROSEMIDE 40 MG PO TABS: ORAL_TABLET | ORAL | 11 refills | Status: DC

## 2018-08-12 NOTE — Patient Instructions (Addendum)
Medication Instructions:   Increase Lasix to 40 mg every other day  Increase potassium to 20 mEq every other day   If you need a refill on your cardiac medications before your next appointment, please call your pharmacy.   Lab work: 7-10 days  BMET, CBC, BNP    If you have labs (blood work) drawn today and your tests are completely normal, you will receive your results only by: Marland Kitchen MyChart Message (if you have MyChart) OR . A paper copy in the mail If you have any lab test that is abnormal or we need to change your treatment, we will call you to review the results.  Testing/Procedures:2D echocardiogram  (same time as lab work)    Follow-Up: :Dr. Harrington Challenger or Richardson Dopp, PA-C in 3-4 weeks   Any Other Special Instructions Will Be Listed Below (If Applicable).

## 2018-08-12 NOTE — Progress Notes (Signed)
Virtual Visit via Telephone Note   This visit type was conducted due to national recommendations for restrictions regarding the COVID-19 Pandemic (e.g. social distancing) in an effort to limit this patient's exposure and mitigate transmission in our community.  Due to her co-morbid illnesses, this patient is at least at moderate risk for complications without adequate follow up.  This format is felt to be most appropriate for this patient at this time.  The patient did not have access to video technology/had technical difficulties with video requiring transitioning to audio format only (telephone).  All issues noted in this document were discussed and addressed.  No physical exam could be performed with this format.  Please refer to the patient's chart for her  consent to telehealth for [facility].   Date:  08/12/2018   ID:  Meghan Schwartz, DOB 1950-04-26, MRN [id number]  Patient Location: Home Provider Location: Home  PCP:  Maurice Small, MD  Cardiologist:  Dorris Carnes, MD   Electrophysiologist:  None   Evaluation Performed:  Follow-Up Visit  Chief Complaint:  Shortness of breath, leg swelling  History of Present Illness:    Meghan Schwartz is a 68 y.o. female with:  Coronary artery disease   S/p NSTEMI 01/2018 >> DES to mid RCA  Heart failure with preserved ejection fraction   Hx of Leg edema, shortness of breath, BNP mildy elevated; tx with low dose Lasix (twice weekly)  Also has L need lat meniscus tear and ganglion cyst (may contribute some to edema)  Rheumatoid Arthritis   Hyperlipidemia   She has had a hx of shortness of breath and leg swelling with minimally elevated BNP.  The patient was last seen by Dr. Harrington Challenger in 05/2018.  She has a L knee lat meniscus tear and is followed by Dr. Durward Fortes.  She also has a ganglion cyst.  Her leg swelling has been managed with Lasix 2-3 times a week.  She called in recently with increased leg swelling and shortness of breath.  Today, she  notes that she has had more swelling in her left leg.  She also has a ganglion cyst that was recently drained.  She increased her Lasix last week as well as had her cyst on her knee drained.  Her swelling is now somewhat better.  She does note that she has shortness of breath with some activities.  This improves when she takes increased Lasix with decreased leg swelling and weight loss.  She sleeps on a slight incline.  She has not had paroxysmal nocturnal dyspnea.  She has not had chest pain, syncope.  She gets dizzy sometimes.  The patient does not have symptoms concerning for COVID-19 infection (fever, chills, cough, or new shortness of breath).    Past Medical History:  Diagnosis Date  . Allergic rhinitis   . Anxiety   . Atrial fibrillation ([place])   . Benzodiazepine dependence ([place])   . Bruxism   . Chronic foot pain    bunions, torn ligaments-on chronic pain medication  . CTS (carpal tunnel syndrome) 06/08/2014  . Depression   . Heart disease   . High cholesterol   . Hypertension   . MDD (major depressive disorder), recurrent severe, without psychosis ([place]) 10/02/2017  . Migraine   . RA (rheumatoid arthritis) ([place])    Past Surgical History:  Procedure Laterality Date  . BTL  2000  . CARPAL TUNNEL RELEASE Left   . [place]  . CORONARY STENT INTERVENTION N/A  01/27/2018   Procedure: CORONARY STENT INTERVENTION;  Surgeon: Kathleene HazelMcAlhany, Christopher D, MD;  Location: [facility] INVASIVE CV LAB;  Service: Cardiovascular;  Laterality: N/A;  . eye implants    . FOOT SURGERY Left 2008  . front teeth removed after injury    . LEFT HEART CATH AND CORONARY ANGIOGRAPHY N/A 01/27/2018   Procedure: LEFT HEART CATH AND CORONARY ANGIOGRAPHY;  Surgeon: Kathleene HazelMcAlhany, Christopher D, MD;  Location: [facility] INVASIVE CV LAB;  Service: Cardiovascular;  Laterality: N/A;  . pre cancerous lesion removed from forehead    . TOTAL ABDOMINAL HYSTERECTOMY W/ BILATERAL SALPINGOOPHORECTOMY  2002   Dr. Jackelyn KnifeMeisinger  .  VAGINAL HYSTERECTOMY  2001     Current Meds  Medication Sig  . Ascorbic Acid (VITAMIN C) 1000 MG tablet Take 1,000 mg by mouth daily.  Marland Kitchen. aspirin 81 MG chewable tablet Chew 1 tablet (81 mg total) by mouth daily.  . Cholecalciferol (VITAMIN D3 PO) Take 2,000 Int'l Units by mouth daily.  . clonazePAM (KLONOPIN) 1 MG tablet Take 1 mg by mouth at bedtime.  . cloNIDine (CATAPRES) 0.1 MG tablet Take 0.1 mg by mouth as directed. Up to three daily as needed  . escitalopram (LEXAPRO) 10 MG tablet Take 15 mg by mouth daily.  Marland Kitchen. estradiol (VIVELLE-DOT) 0.075 MG/24HR Place 1 patch onto the skin 2 (two) times a week. Tuesday and Friday  . etanercept (ENBREL) 50 MG/ML injection Inject 50 mg into the skin every Friday. Self injection once per week   . fexofenadine (ALLEGRA) 180 MG tablet Take 180 mg by mouth daily.  . hydroxychloroquine (PLAQUENIL) 200 MG tablet Take 200 mg by mouth 2 (two) times daily.   . hydrOXYzine (ATARAX/VISTARIL) 25 MG tablet Take 1 tablet (25 mg total) by mouth every 6 (six) hours as needed for anxiety.  . metoprolol tartrate (LOPRESSOR) 25 MG tablet Take 1 tablet (25 mg total) by mouth 2 (two) times daily.  . Multiple Vitamins-Minerals (CENTRUM SILVER ULTRA WOMENS PO) Take 1 tablet by mouth daily.  . nitroGLYCERIN (NITROSTAT) 0.4 MG SL tablet Place 1 tablet (0.4 mg total) under the tongue every 5 (five) minutes as needed for chest pain.  . rosuvastatin (CRESTOR) 20 MG tablet Take 1 tablet (20 mg total) by mouth daily.  . ticagrelor (BRILINTA) 90 MG TABS tablet Take 1 tablet (90 mg total) by mouth 2 (two) times daily.  . [DISCONTINUED] furosemide (LASIX) 40 MG tablet TAKE 1 TABLET BY MOUTH EVERY TUESDAY AND EVERY FRIDAY(TWICE A WEEK), WITH POTASSIUM  . [DISCONTINUED] potassium chloride SA (K-DUR) 20 MEQ tablet TAKE 1 TABLET BY MOUTH EVERY TUESDAY AND EVERY FRIDAY(TWICE A WEEK), WITH FUROSEMIDE  . [DISCONTINUED] ticagrelor (BRILINTA) 90 MG TABS tablet Take 1 tablet (90 mg total) by  mouth 2 (two) times daily.     Allergies:   Erythromycin   Social History   Tobacco Use  . Smoking status: Current Every Day Smoker    Years: 20.00    Types: Cigarettes    Last attempt to quit: 02/25/2010    Years since quitting: 8.4  . Smokeless tobacco: Never Used  . Tobacco comment: Restarted smoking earlier this year. smokes during stressful time   Substance Use Topics  . Alcohol use: No    Alcohol/week: 0.0 standard drinks  . Drug use: No     Family Hx: The patient's family history includes Arthritis in her maternal grandmother; Breast cancer in her mother; Heart attack in her maternal grandmother and paternal grandfather; Parkinson's disease in her father; Raynaud syndrome  in her maternal aunt; Stroke in her paternal grandmother; Thyroid disease in her mother.  ROS:   Please see the history of present illness.    All other systems reviewed and are negative.   Prior CV studies:   The following studies were reviewed today:  Cardiac Catheterization 01/27/18 LAD ost 20, prox 20; D1 ost 20 LCx ost 40 RCA prox 99 PCI:  3.5 x 28 mm Synergy DES to proximal RCA  Echo 01/26/18 EF 50-55; prob inf-lat and inf HK  Labs/Other Tests and Data Reviewed:    EKG:  No ECG reviewed.  Recent Labs: 01/25/2018: Magnesium 2.0 08/11/2018: ALT 29; BUN 18; Creatinine, Ser 0.76; Hemoglobin 12.5; NT-Pro BNP 454; Platelets 196; Potassium 4.3; Sodium 141; TSH 1.610   Recent Lipid Panel Lab Results  Component Value Date/Time   CHOL 122 08/11/2018 11:35 AM   TRIG 71 08/11/2018 11:35 AM   HDL 62 08/11/2018 11:35 AM   CHOLHDL 2.0 08/11/2018 11:35 AM   CHOLHDL 2.7 01/28/2018 03:53 AM   LDLCALC 46 08/11/2018 11:35 AM    Wt Readings from Last 3 Encounters:  08/12/18 137 lb (62.1 kg)  08/11/18 136 lb (61.7 kg)  06/18/18 132 lb (59.9 kg)     Objective:    Vital Signs:  BP 113/87   Ht 5\' 4"  (1.626 m)   Wt 137 lb (62.1 kg)   BMI 23.52 kg/m    VITAL SIGNS:  reviewed GEN:  no acute  distress RESPIRATORY:  No labored breathing NEURO:  Alert and oriented PSYCH:  Normal mood  ASSESSMENT & PLAN:     Shortness of breath / Leg swelling -  Plan: I suspect her symptoms are multifactorial.  She does describe fatigue as well.  Her fatigue is likely related to a combination of rheumatoid arthritis, volume excess and deconditioning.  Beta-blocker therapy may be playing a role as well.  If we can improve her volume and her fatigue continues, I would consider decreasing her beta-blocker.  Some of her leg swelling is related to ganglion cyst.  This improves with drainage.  I do not think that her shortness of breath is related to Brilinta.  Overall, she seems to have some element of volume excess.  She does feel better when she takes extra Lasix.  Therefore, I will increase her Lasix to 40 mg every other day.  I will also increase her potassium to 20 mEq every other day.  Follow-up BMET, BNP, CBC will be arranged in the next 1 week.  Her EF at the time of her myocardial infarction was normal.  The echocardiogram demonstrated normal diastolic function parameters.  As she has continued to have issues with volume excess, I will arrange a repeat echocardiogram to reassess LV EF and diastolic function.  Follow-up with Dr. or me in 3 weeks.  Coronary artery disease involving native coronary artery of native heart without angina pectoris -  Plan: History of non-ST ovation myocardial infarction in December 2019 treated with a drug-eluting stent to the RCA.  She is not having any anginal symptoms.  Continue aspirin, Brilinta, metoprolol, rosuvastatin.  Mixed hyperlipidemia -  Plan:   LDL optimal on most recent lab work.  Continue current Rx.    Rheumatoid arthritis, involving unspecified site, unspecified rheumatoid factor presence (HCC) -  Plan: Continue follow-up with rheumatology.  She remains on hydroxychloroquine.  COVID-19 Education: The signs and symptoms of COVID-19 were discussed  with the patient and how to seek care for testing (follow up  with PCP or arrange E-visit).  The importance of social distancing was discussed today.  Time:   Today, I have spent 26 minutes with the patient with telehealth technology discussing the above problems.     Medication Adjustments/Labs and Tests Ordered: Current medicines are reviewed at length with the patient today.  Concerns regarding medicines are outlined above.   Tests Ordered: Orders Placed This Encounter  Procedures  . Basic metabolic panel  . Pro b natriuretic peptide (BNP)  . CBC  . ECHOCARDIOGRAM COMPLETE    Medication Changes: Meds ordered this encounter  Medications  . ticagrelor (BRILINTA) 90 MG TABS tablet    Sig: Take 1 tablet (90 mg total) by mouth 2 (two) times daily.    Dispense:  180 tablet    Refill:  3    Rx renewal    Order Specific Question:   Supervising Provider    Answer:   Olga Millers S [[id number]]  . furosemide (LASIX) 40 MG tablet    Sig: Take 1 tablet every other day    Dispense:  30 tablet    Refill:  11    Dose increase.  Patient will call for refill    Order Specific Question:   Supervising Provider    Answer:   Lewayne Bunting [[id number]]  . potassium chloride SA (K-DUR) 20 MEQ tablet    Sig: Take 1 tablet every other day    Dispense:  30 tablet    Refill:  11    Dose change.  Patient will call for refill    Order Specific Question:   Supervising Provider    Answer:   Lewayne Bunting [[id number]]    Follow Up:  Virtual Visit or In Person in 3-4 week(s) with Dr. Tenny Craw or Tereso Newcomer, PA-C  Signed, Tereso Newcomer, PA-C  08/12/2018 5:28 PM    [facility]

## 2018-08-12 NOTE — Telephone Encounter (Signed)
Virtual Visit Pre-Appointment Phone Call  "(Name), I am calling you today to discuss your upcoming appointment. We are currently trying to limit exposure to the virus that causes COVID-19 by seeing patients at home rather than in the office."  1. "What is the BEST phone number to call the day of the visit?" - include this in appointment notes  2. "Do you have or have access to (through a family member/friend) a smartphone with video capability that we can use for your visit?" a. If yes - list this number in appt notes as "cell" (if different from BEST phone #) and list the appointment type as a VIDEO visit in appointment notes b. If no - list the appointment type as a PHONE visit in appointment notes  3. Confirm consent - "In the setting of the current Covid19 crisis, you are scheduled for a (phone or video) visit with your provider on (date) at (time).  Just as we do with many in-office visits, in order for you to participate in this visit, we must obtain consent.  If you'd like, I can send this to your mychart (if signed up) or email for you to review.  Otherwise, I can obtain your verbal consent now.  All virtual visits are billed to your insurance company just like a normal visit would be.  By agreeing to a virtual visit, we'd like you to understand that the technology does not allow for your provider to perform an examination, and thus may limit your provider's ability to fully assess your condition. If your provider identifies any concerns that need to be evaluated in person, we will make arrangements to do so.  Finally, though the technology is pretty good, we cannot assure that it will always work on either your or our end, and in the setting of a video visit, we may have to convert it to a phone-only visit.  In either situation, we cannot ensure that we have a secure connection.  Are you willing to proceed?" yes  4. Advise patient to be prepared - "Two hours prior to your appointment, go  ahead and check your blood pressure, pulse, oxygen saturation, and your weight (if you have the equipment to check those) and write them all down. When your visit starts, your provider will ask you for this information. If you have an Apple Watch or Kardia device, please plan to have heart rate information ready on the day of your appointment. Please have a pen and paper handy nearby the day of the visit as well."  5. Give patient instructions for MyChart download to smartphone OR [email or web address] as below if video visit (depending on what platform provider is using)  6. Inform patient they will receive a phone call 15 minutes prior to their appointment time (may be from unknown caller ID) so they should be prepared to answer    TELEPHONE CALL NOTE  Mariah Gerstenberger Taplin has been deemed a candidate for a follow-up tele-health visit to limit community exposure during the Covid-19 pandemic. I spoke with the patient via phone to ensure availability of phone/video source, confirm preferred email & phone number, and discuss instructions and expectations.  I reminded Mercedees Convery Wax to be prepared with any vital sign and/or heart rhythm information that could potentially be obtained via home monitoring, at the time of her visit. I reminded Lavanna Rog Barron to expect a phone call prior to her visit.  De Burrs, [facility] 08/12/2018 2:28 PM   INSTRUCTIONS  FOR DOWNLOADING THE MYCHART APP TO SMARTPHONE  - The patient must first make sure to have activated MyChart and know their login information - If Apple, go to [employer] and type in MyChart in the search bar and download the app. If Android, ask patient to go to [employer] and type in [place] in the search bar and download the app. The app is free but as with any other app downloads, their phone may require them to verify saved payment information or Apple/Android password.  - The patient will need to then log into the app with their MyChart username and  password, and select [facility] as their healthcare provider to link the account. When it is time for your visit, go to the MyChart app, find appointments, and click Begin Video Visit. Be sure to Select Allow for your device to access the Microphone and Camera for your visit. You will then be connected, and your provider will be with you shortly.  **If they have any issues connecting, or need assistance please contact MyChart service desk [phone])**  **If using a computer, in order to ensure the best quality for their visit they will need to use either of the following Internet Browsers: [employer], or Google Chrome**  IF USING DOXIMITY or DOXY.ME - The patient will receive a link just prior to their visit by text.     FULL LENGTH CONSENT FOR TELE-HEALTH VISIT   I hereby voluntarily request, consent and authorize [facility] and its employed or contracted physicians, physician assistants, nurse practitioners or other licensed health care professionals (the Practitioner), to provide me with telemedicine health care services (the "Services") as deemed necessary by the treating Practitioner. I acknowledge and consent to receive the Services by the Practitioner via telemedicine. I understand that the telemedicine visit will involve communicating with the Practitioner through live audiovisual communication technology and the disclosure of certain medical information by electronic transmission. I acknowledge that I have been given the opportunity to request an in-person assessment or other available alternative prior to the telemedicine visit and am voluntarily participating in the telemedicine visit.  I understand that I have the right to withhold or withdraw my consent to the use of telemedicine in the course of my care at any time, without affecting my right to future care or treatment, and that the Practitioner or I may terminate the telemedicine visit at any time. I  understand that I have the right to inspect all information obtained and/or recorded in the course of the telemedicine visit and may receive copies of available information for a reasonable fee.  I understand that some of the potential risks of receiving the Services via telemedicine include:  Marland Kitchen Delay or interruption in medical evaluation due to technological equipment failure or disruption; . Information transmitted may not be sufficient (e.g. poor resolution of images) to allow for appropriate medical decision making by the Practitioner; and/or  . In rare instances, security protocols could fail, causing a breach of personal health information.  Furthermore, I acknowledge that it is my responsibility to provide information about my medical history, conditions and care that is complete and accurate to the best of my ability. I acknowledge that Practitioner's advice, recommendations, and/or decision may be based on factors not within their control, such as incomplete or inaccurate data provided by me or distortions of diagnostic images or specimens that may result from electronic transmissions. I understand that the practice of medicine is not an exact science and  that Practitioner makes no warranties or guarantees regarding treatment outcomes. I acknowledge that I will receive a copy of this consent concurrently upon execution via email to the email address I last provided but may also request a printed copy by calling the office of [facility].    I understand that my insurance will be billed for this visit.   I have read or had this consent read to me. . I understand the contents of this consent, which adequately explains the benefits and risks of the Services being provided via telemedicine.  . I have been provided ample opportunity to ask questions regarding this consent and the Services and have had my questions answered to my satisfaction. . I give my informed consent for the services to be  provided through the use of telemedicine in my medical care  By participating in this telemedicine visit I agree to the above.

## 2018-08-14 NOTE — Addendum Note (Signed)
Addended by: Claude Manges on: 08/14/2018 08:08 AM   Modules accepted: Orders

## 2018-08-19 ENCOUNTER — Telehealth (HOSPITAL_COMMUNITY): Payer: Self-pay | Admitting: *Deleted

## 2018-08-19 NOTE — Telephone Encounter (Signed)

## 2018-08-20 ENCOUNTER — Ambulatory Visit (HOSPITAL_COMMUNITY): Payer: Medicare Other | Attending: Physician Assistant

## 2018-08-20 ENCOUNTER — Other Ambulatory Visit: Payer: Self-pay

## 2018-08-20 ENCOUNTER — Other Ambulatory Visit: Payer: Medicare Other | Admitting: *Deleted

## 2018-08-20 DIAGNOSIS — R0602 Shortness of breath: Secondary | ICD-10-CM | POA: Insufficient documentation

## 2018-08-20 DIAGNOSIS — I2510 Atherosclerotic heart disease of native coronary artery without angina pectoris: Secondary | ICD-10-CM | POA: Diagnosis not present

## 2018-08-21 ENCOUNTER — Encounter: Payer: Self-pay | Admitting: Physician Assistant

## 2018-08-21 DIAGNOSIS — I5030 Unspecified diastolic (congestive) heart failure: Secondary | ICD-10-CM | POA: Insufficient documentation

## 2018-08-21 DIAGNOSIS — I5032 Chronic diastolic (congestive) heart failure: Secondary | ICD-10-CM | POA: Insufficient documentation

## 2018-08-21 LAB — PRO B NATRIURETIC PEPTIDE: NT-Pro BNP: 384 pg/mL — ABNORMAL HIGH (ref 0–301)

## 2018-08-21 LAB — BASIC METABOLIC PANEL
BUN/Creatinine Ratio: 16 (ref 12–28)
BUN: 12 mg/dL (ref 8–27)
CO2: 27 mmol/L (ref 20–29)
Calcium: 9.3 mg/dL (ref 8.7–10.3)
Chloride: 101 mmol/L (ref 96–106)
Creatinine, Ser: 0.76 mg/dL (ref 0.57–1.00)
GFR calc Af Amer: 93 mL/min/{1.73_m2} (ref >59)
GFR calc non Af Amer: 80 mL/min/{1.73_m2} (ref >59)
Glucose: 77 mg/dL (ref 65–99)
Potassium: 3.8 mmol/L (ref 3.5–5.2)
Sodium: 143 mmol/L (ref 134–144)

## 2018-09-14 ENCOUNTER — Telehealth: Payer: Self-pay | Admitting: Physician Assistant

## 2018-09-14 DIAGNOSIS — I2510 Atherosclerotic heart disease of native coronary artery without angina pectoris: Secondary | ICD-10-CM | POA: Insufficient documentation

## 2018-09-14 NOTE — Progress Notes (Signed)
Cardiology Office Note:    Date:  09/15/2018   ID:  VODA ELSAESSER, DOB 07/17/1950, MRN [id number]  PCP:  Shirlean Mylar, MD  Cardiologist:  Dietrich Pates, MD   Electrophysiologist:  None   Referring MD: Shirlean Mylar, MD   Chief Complaint  Patient presents with  . Follow-up    CAD, CHF  . Fatigue     History of Present Illness:    Meghan Schwartz is a 68 y.o. female with:  Coronary artery disease  ? S/p NSTEMI 01/2018 >> DES to prox RCA  Heart failure with preserved ejection fraction  ? Hx of Leg edema, shortness of breath, BNP mildy elevated; tx with low dose Lasix (twice weekly) ? Also has L need lat meniscus tear and ganglion cyst (may contribute some to edema)  Rheumatoid Arthritis   Hyperlipidemia    Meghan Schwartz was last seen via Telemedicine 08/12/2018 for leg swelling and shortness of breath.  Of note, she did have some improvement in her swelling after a ganglion cyst was drained.  I adjusted her Lasix and arranged a follow up echocardiogram.  This demonstrated normal ejection fraction and mild diastolic dysfunction.  She returns for follow up.  She is here alone.  She has felt very tired the last few days.  She has fallen asleep while drinking coffee.  She has not passed out.  She has not had vomiting, diarrhea.  Her bowel movements have been a little darker.  She still has shortness of breath at times.  This is unchanged.  She has not had orthopnea, paroxysmal nocturnal dyspnea, chest pain.  Her leg swelling is improved.   Prior CV studies:   The following studies were reviewed today:  Echocardiogram 08/20/2018 EF 55-60, Gr 1 DD, GLS -22.3, PASP 28  Cardiac Catheterization12/3/19 LAD ost 20, prox 20; D1 ost 20 LCx ost 40 RCA prox 99 PCI: 3.5 x 28 mm Synergy DES to proximal RCA  Echo 01/26/18 EF 50-55; prob inf-lat and inf HK  Past Medical History:  Diagnosis Date  . Allergic rhinitis   . Anxiety   . Atrial fibrillation (HCC)   . Benzodiazepine dependence (HCC)   .  Bruxism   . Chronic diastolic (congestive) heart failure (HCC)    Echocardiogram 07/2018: EF 55-60, grade 1 diastolic dysfunction, normal wall motion, normal GLS (-22.3), PASP 28  . Chronic foot pain    bunions, torn ligaments-on chronic pain medication  . CTS (carpal tunnel syndrome) 06/08/2014  . Depression   . Heart disease   . High cholesterol   . Hypertension   . MDD (major depressive disorder), recurrent severe, without psychosis (HCC) 10/02/2017  . Migraine   . RA (rheumatoid arthritis) (HCC)    Surgical Hx: The patient  has a past surgical history that includes Foot surgery (Left, 2008); Vaginal hysterectomy (2001); Cesarean section (1978. 1983); LEFT HEART CATH AND CORONARY ANGIOGRAPHY (N/A, 01/27/2018); CORONARY STENT INTERVENTION (N/A, 01/27/2018); BTL (2000); Total abdominal hysterectomy w/ bilateral salpingoophorectomy (2002); Carpal tunnel release (Left); eye implants; front teeth removed after injury; and pre cancerous lesion removed from forehead.   Current Medications: Current Meds  Medication Sig  . Ascorbic Acid (VITAMIN C) 1000 MG tablet Take 1,000 mg by mouth daily.  Marland Kitchen aspirin 81 MG chewable tablet Chew 1 tablet (81 mg total) by mouth daily.  . Buprenorphine HCl-Naloxone HCl 5.7-1.4 MG SUBL Place under the tongue.  . Cholecalciferol (VITAMIN D3 PO) Take 2,000 Int'l Units by mouth daily.  . clonazePAM (KLONOPIN)  1 MG tablet Take 1 mg by mouth at bedtime.  . cloNIDine (CATAPRES) 0.1 MG tablet Take 0.1 mg by mouth as directed. Up to three daily as needed  . escitalopram (LEXAPRO) 10 MG tablet Take 15 mg by mouth daily.  Marland Kitchen estradiol (VIVELLE-DOT) 0.075 MG/24HR Place 1 patch onto the skin 2 (two) times a week. Tuesday and Friday  . etanercept (ENBREL) 50 MG/ML injection Inject 50 mg into the skin every Friday. Self injection once per week   . fexofenadine (ALLEGRA) 180 MG tablet Take 180 mg by mouth daily.  . furosemide (LASIX) 40 MG tablet Take half of a tablet take every Mon  Wed and Fri  . hydroxychloroquine (PLAQUENIL) 200 MG tablet Take 200 mg by mouth 2 (two) times daily.   . hydrOXYzine (ATARAX/VISTARIL) 25 MG tablet Take 1 tablet (25 mg total) by mouth every 6 (six) hours as needed for anxiety.  . metoprolol tartrate (LOPRESSOR) 25 MG tablet Take 1 tablet (25 mg total) by mouth 2 (two) times daily.  . Multiple Vitamins-Minerals (CENTRUM SILVER ULTRA WOMENS PO) Take 1 tablet by mouth daily.  . nitroGLYCERIN (NITROSTAT) 0.4 MG SL tablet Place 1 tablet (0.4 mg total) under the tongue every 5 (five) minutes as needed for chest pain.  . potassium chloride SA (K-DUR) 20 MEQ tablet Take half of a  tablet  every  Mon Wed and Fri  . rosuvastatin (CRESTOR) 20 MG tablet Take 1 tablet (20 mg total) by mouth daily.  . ticagrelor (BRILINTA) 90 MG TABS tablet Take 1 tablet (90 mg total) by mouth 2 (two) times daily.  . [DISCONTINUED] furosemide (LASIX) 40 MG tablet Take 1 tablet every other day  . [DISCONTINUED] furosemide (LASIX) 40 MG tablet Take 1 take every Mon Wed and Fri  . [DISCONTINUED] potassium chloride SA (K-DUR) 20 MEQ tablet Take 1 tablet every other day  . [DISCONTINUED] potassium chloride SA (K-DUR) 20 MEQ tablet Take 1 tablet  every  Mon Wed and Fri     Allergies:   Erythromycin   Social History   Tobacco Use  . Smoking status: Current Every Day Smoker    Years: 20.00    Types: Cigarettes    Last attempt to quit: 02/25/2010    Years since quitting: 8.5  . Smokeless tobacco: Never Used  . Tobacco comment: Restarted smoking earlier this year. smokes during stressful time   Substance Use Topics  . Alcohol use: No    Alcohol/week: 0.0 standard drinks  . Drug use: No     Family Hx: The patient's family history includes Arthritis in her maternal grandmother; Breast cancer in her mother; Heart attack in her maternal grandmother and paternal grandfather; Parkinson's disease in her father; Raynaud syndrome in her maternal aunt; Stroke in her paternal  grandmother; Thyroid disease in her mother.  ROS:   Please see the history of present illness.    ROS All other systems reviewed and are negative.   EKGs/Labs/Other Test Reviewed:    EKG:  EKG is  ordered today.  The ekg ordered today demonstrates normal sinus rhythm, HR 61, normal axis, low voltage, QTc 489, non-specific ST-TW changes.  Recent Labs: 01/25/2018: Magnesium 2.0 08/11/2018: ALT 29; Hemoglobin 12.5; Platelets 196; TSH 1.610 08/20/2018: BUN 12; Creatinine, Ser 0.76; NT-Pro BNP 384; Potassium 3.8; Sodium 143   Recent Lipid Panel Lab Results  Component Value Date/Time   CHOL 122 08/11/2018 11:35 AM   TRIG 71 08/11/2018 11:35 AM   HDL 62 08/11/2018 11:35 AM  CHOLHDL 2.0 08/11/2018 11:35 AM   CHOLHDL 2.7 01/28/2018 03:53 AM   LDLCALC 46 08/11/2018 11:35 AM    Physical Exam:    VS:  BP 96/68   Pulse 61   Ht 5\' 4"  (1.626 m)   Wt 140 lb (63.5 kg)   BMI 24.03 kg/m     Wt Readings from Last 3 Encounters:  09/15/18 140 lb (63.5 kg)  08/12/18 137 lb (62.1 kg)  08/11/18 136 lb (61.7 kg)     Physical Exam  Constitutional: She is oriented to person, place, and time. She appears well-developed and well-nourished. No distress.  HENT:  Head: Normocephalic and atraumatic.  Eyes: No scleral icterus.  Neck: No JVD present. No thyromegaly present.  Cardiovascular: Normal rate, regular rhythm and normal heart sounds.  No murmur heard. Pulmonary/Chest: Effort normal and breath sounds normal. She has no rales.  Abdominal: Soft. There is no hepatomegaly.  Musculoskeletal:        General: Edema (trace ankle edema bilaterally) present.  Lymphadenopathy:    She has no cervical adenopathy.  Neurological: She is alert and oriented to person, place, and time.  Skin: Skin is warm and dry.  Psychiatric: She has a normal mood and affect.    ASSESSMENT & PLAN:    1. Chronic diastolic (congestive) heart failure (HCC) Echocardiogram in 07/2018 with EF 55-60, mild diastolic  dysfunction.  Overall her volume is stable.  However, now her blood pressure is running low.  She seems to be symptomatic.  I will cut back on her Lasix as outlined below.  We discussed weighing daily and when to take extra Lasix.    2. Coronary artery disease involving native coronary artery of native heart without angina pectoris Hx of NSTEMI in 01/2018 tx with DES to the RCA.  She is not having angina.  Continue ASA, Brilinta, beta-blocker, statin.  She has had issues with shortness of breath since her MI.  I suspect she has side effects to Brilinta.  I will review with Dr. 02/2018 +/- changing Brilinta to Plavix.    3. Mixed hyperlipidemia LDL optimal on most recent lab work.  Continue current Rx.    4. Hypotension, unspecified hypotension type I suspect this is related to medications.  I will decrease her Lasix to 20 mg every M, W, F.  I will reduce her K+ to 10 mEq every M, W, F.  I have asked her to hold her metoprolol tonight and tomorrow AM. She can resume Metoprolol tomorrow PM.  I have asked her to add a little extra salt to her food tonight.  I have also asked her to review her Clonidine with her PCP to see if she can take an alternative.  For now, I have asked her to try to limit the amount she takes.  I will obtain a CBC, BMET today.  FU with me or Dr. 09-24-1990 in 2 weeks.     Dispo:  Return in about 2 weeks (around 09/29/2018) for Close Follow Up, w/ Dr. 11/29/2018, or Tenny Craw, PA-C.   Medication Adjustments/Labs and Tests Ordered: Current medicines are reviewed at length with the patient today.  Concerns regarding medicines are outlined above.  Tests Ordered: Orders Placed This Encounter  Procedures  . Basic metabolic panel  . CBC  . EKG 12-Lead   Medication Changes: Meds ordered this encounter  Medications  . DISCONTD: potassium chloride SA (K-DUR) 20 MEQ tablet    Sig: Take 1 tablet  every  Mon Wed  and Fri    Dispense:  30 tablet    Refill:  11    Dose change.  Patient will call  for refill  . DISCONTD: furosemide (LASIX) 40 MG tablet    Sig: Take 1 take every Mon Wed and Fri    Dispense:  30 tablet    Refill:  11    Dose increase.  Patient will call for refill  . potassium chloride SA (K-DUR) 20 MEQ tablet    Sig: Take half of a  tablet  every  Mon Wed and Fri    Dispense:  30 tablet    Refill:  11    Dose change.  Patient will call for refill  . furosemide (LASIX) 40 MG tablet    Sig: Take half of a tablet take every Mon Wed and Fri    Dispense:  30 tablet    Refill:  11    Dose increase.  Patient will call for refill    Signed, Richardson Dopp, PA-C  09/15/2018 3:53 PM    [address], [place], [place]  [address] Phone: [phone]; Fax: [phone]

## 2018-09-14 NOTE — Telephone Encounter (Signed)
New Message         COVID-19 Pre-Screening Questions:   In the past 7 to 10 days have you had a cough,  shortness of breath, headache, congestion, fever (100 or greater) body aches, chills, sore throat, or sudden loss of taste or sense of smell? NO  Have you been around anyone with known Covid 19. NO  Have you been around anyone who is awaiting Covid 19 test results in the past 7 to 10 days? NO  Have you been around anyone who has been exposed to Covid 19, or has mentioned symptoms of Covid 19 within the past 7 to 10 days? NO  If you have any concerns/questions about symptoms patients report during screening (either on the phone or at threshold). Contact the provider seeing the patient or DOD for further guidance.  If neither are available contact a member of the leadership team.           s

## 2018-09-15 ENCOUNTER — Other Ambulatory Visit: Payer: Self-pay

## 2018-09-15 ENCOUNTER — Ambulatory Visit (INDEPENDENT_AMBULATORY_CARE_PROVIDER_SITE_OTHER): Payer: Medicare Other | Admitting: Physician Assistant

## 2018-09-15 ENCOUNTER — Encounter: Payer: Self-pay | Admitting: Physician Assistant

## 2018-09-15 VITALS — BP 96/68 | HR 61 | Ht 64.0 in | Wt 140.0 lb

## 2018-09-15 DIAGNOSIS — I5032 Chronic diastolic (congestive) heart failure: Secondary | ICD-10-CM | POA: Diagnosis not present

## 2018-09-15 DIAGNOSIS — I959 Hypotension, unspecified: Secondary | ICD-10-CM | POA: Diagnosis not present

## 2018-09-15 DIAGNOSIS — I2510 Atherosclerotic heart disease of native coronary artery without angina pectoris: Secondary | ICD-10-CM

## 2018-09-15 DIAGNOSIS — E782 Mixed hyperlipidemia: Secondary | ICD-10-CM | POA: Diagnosis not present

## 2018-09-15 MED ORDER — POTASSIUM CHLORIDE CRYS ER 20 MEQ PO TBCR: EXTENDED_RELEASE_TABLET | ORAL | 11 refills | Status: DC

## 2018-09-15 MED ORDER — FUROSEMIDE 40 MG PO TABS: ORAL_TABLET | ORAL | 11 refills | Status: DC

## 2018-09-15 NOTE — Patient Instructions (Signed)
Medication Instructions:  Decrease your Lasix (Furosemide) and potassium    - Take Lasix 20 mg (1/2 of the 40 mg tab) every Mon, Wed, Fri   - Take 10 mEq (1/2 of the 20 mEq tab) every Mon, Wed, Fri  Do not take Metoprolol tonight or tomorrow morning; you can resume it tomorrow night  Try to limit how much Clonidine you take as this can also lower your blood pressure   If you need a refill on your cardiac medications before your next appointment, please call your pharmacy.   Lab work: Today - BMET, CBC   If you have labs (blood work) drawn today and your tests are completely normal, you will receive your results only by: Marland Kitchen MyChart Message (if you have MyChart) OR . A paper copy in the mail If you have any lab test that is abnormal or we need to change your treatment, we will call you to review the results.  Testing/Procedures: None  Follow-Up: At [facility], you and your health needs are our priority.  As part of our continuing mission to provide you with exceptional heart care, we have created designated Provider Care Teams.  These Care Teams include your primary Cardiologist (physician) and Advanced Practice Providers (APPs -  Physician Assistants and Nurse Practitioners) who all work together to provide you with the care you need, when you need it. Richardson Dopp, PA-C or Dr. Harrington Challenger in 2 weeks  Any Other Special Instructions Will Be Listed Below (If Applicable).  Weigh daily If your weight goes up 3 lbs or more in 1 day, take an extra Lasix 20 mg that day and call  Check your blood pressure daily If your systolic blood pressure (the top number) remains less than 100 over the next couple of days, call  Add a little salt to your meal tonight to help your blood pressure increase some  Call Dr. Justin Mend to see if there is any alternative to Clonidine since your blood pressure is running so low

## 2018-09-16 LAB — BASIC METABOLIC PANEL
BUN/Creatinine Ratio: 19 (ref 12–28)
BUN: 14 mg/dL (ref 8–27)
CO2: 24 mmol/L (ref 20–29)
Calcium: 9 mg/dL (ref 8.7–10.3)
Chloride: 104 mmol/L (ref 96–106)
Creatinine, Ser: 0.74 mg/dL (ref 0.57–1.00)
GFR calc Af Amer: 96 mL/min/{1.73_m2} (ref >59)
GFR calc non Af Amer: 83 mL/min/{1.73_m2} (ref >59)
Glucose: 94 mg/dL (ref 65–99)
Potassium: 4 mmol/L (ref 3.5–5.2)
Sodium: 143 mmol/L (ref 134–144)

## 2018-09-16 LAB — CBC
Hematocrit: 38.1 % (ref 34.0–46.6)
Hemoglobin: 12.6 g/dL (ref 11.1–15.9)
MCH: 26.8 pg (ref 26.6–33.0)
MCHC: 33.1 g/dL (ref 31.5–35.7)
MCV: 81 fL (ref 79–97)
Platelets: 177 10*3/uL (ref 150–450)
RBC: 4.71 x10E6/uL (ref 3.77–5.28)
RDW: 13.8 % (ref 11.7–15.4)
WBC: 2.9 10*3/uL — ABNORMAL LOW (ref 3.4–10.8)

## 2018-09-16 NOTE — Progress Notes (Signed)
Would recommend switching to plavix 75 mg   Stop Brilinta   Follow SOB symptoms

## 2018-09-17 ENCOUNTER — Telehealth: Payer: Self-pay | Admitting: Physician Assistant

## 2018-09-17 NOTE — Telephone Encounter (Signed)
Lm to call back ./cy 

## 2018-09-17 NOTE — Telephone Encounter (Signed)
Please call patient. I discussed with Dr. Harrington Challenger. We can change her Brilinta to Plavix. PLAN:  1. Stop Brilinta  2. 12 hours after last dose of Brilinta, start Plavix. 3. Take Plavix 300 mg x 1, then 75 mg once daily  Richardson Dopp, PA-C    09/17/2018 10:52 AM

## 2018-09-17 NOTE — Telephone Encounter (Signed)
-----   Message from Damian Leavell, RN sent at 09/17/2018  8:07 AM EDT -----   ----- Message ----- From: Sharmon Revere Sent: 09/15/2018   3:53 PM EDT To: Fay Records, MD  Nevin Bloodgood - I think she may have shortness of breath related to Brilinta.  She is > 6 mos since her MI.  Are you ok if I switch her to Plavix now?Thanks,Scott

## 2018-09-18 MED ORDER — CLOPIDOGREL BISULFATE 75 MG PO TABS: ORAL_TABLET | ORAL | 11 refills | Status: DC

## 2018-09-18 NOTE — Telephone Encounter (Signed)
Lm to call back ./cy 

## 2018-09-18 NOTE — Telephone Encounter (Signed)
Pt aware of med change ./cy 

## 2018-09-18 NOTE — Telephone Encounter (Signed)
Patient returned your call.

## 2018-10-13 ENCOUNTER — Telehealth: Payer: Self-pay | Admitting: Physician Assistant

## 2018-10-13 NOTE — Telephone Encounter (Signed)
Please call patient to get an update on her symptoms.    If she has redness and signs of cellulitis, she should see primary care ASAP. She can increase Lasix to 40 mg once daily x 3 days, then resume usual dose. Increase K+ to 20 mEq once daily x 3 days, then resume usual dose. Call us back in 3 days to let us know how her weight, swelling is doing.   Richardson Dopp, PA-C    10/13/2018 5:25 PM

## 2018-10-13 NOTE — Telephone Encounter (Signed)
Left message to call office tomorrow morning

## 2018-10-13 NOTE — Telephone Encounter (Signed)
  Patient states her cellulitis is back and both her legs are swollen, warm and red up to the knee. She says she has gained about 8 pounds since her last visit with Richardson Dopp in July. She reports her blood pressure has been fine. Patient states that she has had some dizziness when she is moving around. She has felt nauseated the past two to three days. She has also felt fatigued.

## 2018-10-14 NOTE — Telephone Encounter (Signed)
Follow-up:  Husband returning call on behalf of patient

## 2018-10-14 NOTE — Telephone Encounter (Signed)
I spoke with pt. She reports cellulitis in both legs.  I instructed her to contact primary care to schedule appointment to have this evaluated as soon as possible. Pt reports BP is OK. She reports being sleepy and "out of it" yesterday.  Is feeling better today. Weight is 148 lbs. She did not decrease potassium as directed at last office visit. Has been taking potassium 20 meq on M,W, F.  She is not sure she if she has been taking furosemide.  Her husband gets her medications together for her and he is not home at the current time. Pt will have husband call us when he gets home to verify medications.

## 2018-10-14 NOTE — Telephone Encounter (Signed)
Follow up  ° ° °Patient is returning call.  °

## 2018-10-14 NOTE — Telephone Encounter (Signed)
Pt called back. She reports she found potassium and lasix in her glove box.  She had another bottle of potassium in her house so she has been taking as outlined below.  She has not been taking furosemide since office visit with Richardson Dopp, PA. I gave her instructions below from [place], [place] regarding Lasix and potassium. I asked her to contact us on Friday with an update.

## 2018-10-21 ENCOUNTER — Ambulatory Visit: Payer: Medicare Other | Admitting: Physician Assistant

## 2018-10-21 NOTE — Progress Notes (Deleted)
Cardiology Office Note:    Date:  10/21/2018   ID:  JAVERIA BRISKI, DOB [birth year], MRN [id number]  PCP:  Shirlean Mylar, MD  Cardiologist:  Dietrich Pates, MD *** Electrophysiologist:  None   Referring MD: Shirlean Mylar, MD   No chief complaint on file. ***  History of Present Illness:    Meghan Schwartz is a 68 y.o. female with ***  Coronary artery disease  ? S/p NSTEMI 01/2018 >> DES to prox RCA  Heart failure with preserved ejection fraction  ? Hx of Leg edema, shortness of breath, BNP mildy elevated; tx with low dose Lasix (twice weekly) ? Also has L need lat meniscus tear and ganglion cyst (may contribute some to edema)  Rheumatoid Arthritis  Hyperlipidemia    Ms. Crull was last seen in the office 09/15/2018.   She was hypotensive.  I cut back on her Furosemide.  I also d/w Dr. Tenny Craw changing her Brilinta to Plavix due to side effect of shortness of breath.  She agreed with changing.  She called in recently with increased swelling and we adjusted her Furosemide for a few days. ***  Prior CV studies:   The following studies were reviewed today:  *** Echocardiogram 08/20/2018 EF 55-60, Gr 1 DD, GLS -22.3, PASP 28  Cardiac Catheterization12/3/19 LAD ost 20, prox 20; D1 ost 20 LCx ost 40 RCA prox 99 PCI: 3.5 x 28 mm Synergy DES to proximal RCA  Echo 01/26/18 EF 50-55; prob inf-lat and inf HK   Past Medical History:  Diagnosis Date  . Allergic rhinitis   . Anxiety   . Atrial fibrillation (HCC)   . Benzodiazepine dependence (HCC)   . Bruxism   . Chronic diastolic (congestive) heart failure (HCC)    Echocardiogram 07/2018: EF 55-60, grade 1 diastolic dysfunction, normal wall motion, normal GLS (-22.3), PASP 28  . Chronic foot pain    bunions, torn ligaments-on chronic pain medication  . CTS (carpal tunnel syndrome) 06/08/2014  . Depression   . Heart disease   . High cholesterol   . Hypertension   . MDD (major depressive disorder), recurrent severe, without psychosis  (HCC) 10/02/2017  . Migraine   . RA (rheumatoid arthritis) (HCC)    Surgical Hx: The patient  has a past surgical history that includes Foot surgery (Left, 2008); Vaginal hysterectomy (2001); Cesarean section (1978. 1983); LEFT HEART CATH AND CORONARY ANGIOGRAPHY (N/A, 01/27/2018); CORONARY STENT INTERVENTION (N/A, 01/27/2018); BTL (2000); Total abdominal hysterectomy w/ bilateral salpingoophorectomy (2002); Carpal tunnel release (Left); eye implants; front teeth removed after injury; and pre cancerous lesion removed from forehead.   Current Medications: No outpatient medications have been marked as taking for the 10/21/18 encounter (Appointment) with Tereso Newcomer T, PA-C.     Allergies:   Erythromycin   Social History   Tobacco Use  . Smoking status: Current Every Day Smoker    Years: 20.00    Types: Cigarettes    Last attempt to quit: 02/25/2010    Years since quitting: 8.6  . Smokeless tobacco: Never Used  . Tobacco comment: Restarted smoking earlier this year. smokes during stressful time   Substance Use Topics  . Alcohol use: No    Alcohol/week: 0.0 standard drinks  . Drug use: No     Family Hx: The patient's family history includes Arthritis in her maternal grandmother; Breast cancer in her mother; Heart attack in her maternal grandmother and paternal grandfather; Parkinson's disease in her father; Raynaud syndrome in her maternal aunt; Stroke  in her paternal grandmother; Thyroid disease in her mother.  ROS:   Please see the history of present illness.    ROS All other systems reviewed and are negative.   EKGs/Labs/Other Test Reviewed:    EKG:  EKG is *** ordered today.  The ekg ordered today demonstrates ***  Recent Labs: 01/25/2018: Magnesium 2.0 08/11/2018: ALT 29; TSH 1.610 08/20/2018: NT-Pro BNP 384 09/15/2018: BUN 14; Creatinine, Ser 0.74; Hemoglobin 12.6; Platelets 177; Potassium 4.0; Sodium 143   Recent Lipid Panel Lab Results  Component Value Date/Time   CHOL  122 08/11/2018 11:35 AM   TRIG 71 08/11/2018 11:35 AM   HDL 62 08/11/2018 11:35 AM   CHOLHDL 2.0 08/11/2018 11:35 AM   CHOLHDL 2.7 01/28/2018 03:53 AM   LDLCALC 46 08/11/2018 11:35 AM    Physical Exam:    VS:  There were no vitals taken for this visit.    Wt Readings from Last 3 Encounters:  09/15/18 140 lb (63.5 kg)  08/12/18 137 lb (62.1 kg)  08/11/18 136 lb (61.7 kg)     ***Physical Exam  ASSESSMENT & PLAN:    *** 1. Chronic diastolic (congestive) heart failure (HCC) Echocardiogram in 07/2018 with EF 63-89, mild diastolic dysfunction.  Overall her volume is stable.  However, now her blood pressure is running low.  She seems to be symptomatic.  I will cut back on her Lasix as outlined below.  We discussed weighing daily and when to take extra Lasix.    2. Coronary artery disease involving native coronary artery of native heart without angina pectoris Hx of NSTEMI in 01/2018 tx with DES to the RCA.  She is not having angina.  Continue ASA, Brilinta, beta-blocker, statin.  She has had issues with shortness of breath since her MI.  I suspect she has side effects to Brilinta.  I will review with Dr. Harrington Challenger +/- changing Brilinta to Plavix.    3. Mixed hyperlipidemia LDL optimal on most recent lab work.  Continue current Rx.    4. Hypotension, unspecified hypotension type I suspect this is related to medications.  I will decrease her Lasix to 20 mg every M, W, F.  I will reduce her K+ to 10 mEq every M, W, F.  I have asked her to hold her metoprolol tonight and tomorrow AM. She can resume Metoprolol tomorrow PM.  I have asked her to add a little extra salt to her food tonight.  I have also asked her to review her Clonidine with her PCP to see if she can take an alternative.  For now, I have asked her to try to limit the amount she takes.  I will obtain a CBC, BMET today.  FU with me or Dr. Harrington Challenger in 2 weeks.    Dispo:  No follow-ups on file.   Medication Adjustments/Labs and Tests  Ordered: Current medicines are reviewed at length with the patient today.  Concerns regarding medicines are outlined above.  Tests Ordered: No orders of the defined types were placed in this encounter.  Medication Changes: No orders of the defined types were placed in this encounter.   Signed, Richardson Dopp, PA-C  10/21/2018 9:59 AM    [facility] [address], [place], [place]  [address] Phone: [phone]; Fax: [phone]

## 2018-10-30 ENCOUNTER — Telehealth: Payer: Self-pay | Admitting: Physician Assistant

## 2018-10-30 NOTE — Telephone Encounter (Signed)
Patient says she called our office the day of her appt to cancel her appt. She had a last minute appt at her PCP office. She wants to make sure she does not get billed

## 2018-11-04 ENCOUNTER — Ambulatory Visit: Payer: Medicare Other | Admitting: Adult Health

## 2018-11-04 ENCOUNTER — Telehealth: Payer: Self-pay

## 2018-11-04 NOTE — Telephone Encounter (Signed)
Patient was a no call/no show for their appointment today.   

## 2018-11-13 ENCOUNTER — Ambulatory Visit: Payer: Medicare Other | Admitting: Internal Medicine

## 2018-11-13 ENCOUNTER — Other Ambulatory Visit: Payer: Self-pay

## 2018-11-13 ENCOUNTER — Encounter: Payer: Self-pay | Admitting: Internal Medicine

## 2018-11-13 VITALS — BP 99/67 | HR 63 | Ht 64.0 in | Wt 154.1 lb

## 2018-11-13 DIAGNOSIS — I5032 Chronic diastolic (congestive) heart failure: Secondary | ICD-10-CM | POA: Diagnosis not present

## 2018-11-13 DIAGNOSIS — M7989 Other specified soft tissue disorders: Secondary | ICD-10-CM

## 2018-11-13 DIAGNOSIS — R0602 Shortness of breath: Secondary | ICD-10-CM

## 2018-11-13 MED ORDER — FUROSEMIDE 40 MG PO TABS: 60.0000 mg | ORAL_TABLET | ORAL | 3 refills | Status: DC

## 2018-11-13 MED ORDER — METOPROLOL TARTRATE 25 MG PO TABS: 25.0000 mg | ORAL_TABLET | Freq: Every day | ORAL | 3 refills | Status: DC

## 2018-11-13 NOTE — Progress Notes (Signed)
Cardiology Office Note   Date:  11/13/2018   ID:  Meghan Schwartz, DOB Nov 18, 1950, MRN [id number]  PCP:  Maurice Small, MD  Cardiologist:   Dorris Carnes, MD   F/U of CAD     History of Present Illness:  Meghan Schwartz is a 68 y.o. female with a hx of CAD   She is s/p NSTEMI in 01/2018   Underwent DES to RCA   Also has chronic diastolic CHF, RA and HL    The pt was seen by [name] in July   Eco had been done that showed normal LVEF    SHe complained of fatigue, falling asleep easily   Still had SOB at times   He cut backon her lasic  SInce seen she has remained fatigued    Has intermitt swelling    Current Meds  Medication Sig  . Ascorbic Acid (VITAMIN C) 1000 MG tablet Take 1,000 mg by mouth daily.  Marland Kitchen aspirin 81 MG chewable tablet Chew 1 tablet (81 mg total) by mouth daily.  . Buprenorphine HCl-Naloxone HCl 5.7-1.4 MG SUBL Place under the tongue.  . Cholecalciferol (VITAMIN D3 PO) Take 2,000 Int'l Units by mouth daily.  . clonazePAM (KLONOPIN) 1 MG tablet Take 1 mg by mouth at bedtime.  . cloNIDine (CATAPRES) 0.1 MG tablet Take 0.1 mg by mouth as directed. Up to three daily as needed  . escitalopram (LEXAPRO) 10 MG tablet Take 15 mg by mouth daily.  Marland Kitchen estradiol (VIVELLE-DOT) 0.075 MG/24HR Place 1 patch onto the skin 2 (two) times a week. Tuesday and Friday  . etanercept (ENBREL) 50 MG/ML injection Inject 50 mg into the skin every Friday. Self injection once per week   . fexofenadine (ALLEGRA) 180 MG tablet Take 180 mg by mouth daily.  . furosemide (LASIX) 40 MG tablet Take half of a tablet take every Mon Wed and Fri  . hydroxychloroquine (PLAQUENIL) 200 MG tablet Take 200 mg by mouth 2 (two) times daily.   . hydrOXYzine (ATARAX/VISTARIL) 25 MG tablet Take 1 tablet (25 mg total) by mouth every 6 (six) hours as needed for anxiety.  . metoprolol tartrate (LOPRESSOR) 25 MG tablet Take 1 tablet (25 mg total) by mouth 2 (two) times daily.  . nitroGLYCERIN (NITROSTAT) 0.4 MG SL tablet Place 1  tablet (0.4 mg total) under the tongue every 5 (five) minutes as needed for chest pain.  . potassium chloride SA (K-DUR) 20 MEQ tablet Take half of a  tablet  every  Mon Wed and Fri  . rosuvastatin (CRESTOR) 20 MG tablet Take 1 tablet (20 mg total) by mouth daily.     Allergies:   Erythromycin   Past Medical History:  Diagnosis Date  . Allergic rhinitis   . Anxiety   . Atrial fibrillation ([place])   . Benzodiazepine dependence ([place])   . Bruxism   . Chronic diastolic (congestive) heart failure (HCC)    Echocardiogram 07/2018: EF 94-85, grade 1 diastolic dysfunction, normal wall motion, normal GLS (-22.3), PASP 28  . Chronic foot pain    bunions, torn ligaments-on chronic pain medication  . CTS (carpal tunnel syndrome) 06/08/2014  . Depression   . Heart disease   . High cholesterol   . Hypertension   . MDD (major depressive disorder), recurrent severe, without psychosis ([place]) 10/02/2017  . Migraine   . RA (rheumatoid arthritis) ([place])     Past Surgical History:  Procedure Laterality Date  . BTL  2000  . CARPAL TUNNEL  RELEASE Left   . CESAREAN SECTION  1978. 1983  . CORONARY STENT INTERVENTION N/A 01/27/2018   Procedure: CORONARY STENT INTERVENTION;  Surgeon: Kathleene Hazel, MD;  [facility];  Service: Cardiovascular;  Laterality: N/A;  . eye implants    . FOOT SURGERY Left 2008  . front teeth removed after injury    . LEFT HEART CATH AND CORONARY ANGIOGRAPHY N/A 01/27/2018   Procedure: LEFT HEART CATH AND CORONARY ANGIOGRAPHY;  Surgeon: Kathleene Hazel, MD;  Location[facility];  Service: Cardiovascular;  Laterality: N/A;  . pre cancerous lesion removed from forehead    . TOTAL ABDOMINAL HYSTERECTOMY W/ BILATERAL SALPINGOOPHORECTOMY  2002   Dr. Jackelyn Knife  . VAGINAL HYSTERECTOMY  2001     Social History:  The patient  reports that she has been smoking cigarettes. She has smoked for the past 20.00 years. She has never used smokeless  tobacco. She reports that she does not drink alcohol or use drugs.   Family History:  The patient's family history includes Arthritis in her maternal grandmother; Breast cancer in her mother; Heart attack in her maternal grandmother and paternal grandfather; Parkinson's disease in her father; Raynaud syndrome in her maternal aunt; Stroke in her paternal grandmother; Thyroid disease in her mother.    ROS:  Please see the history of present illness. All other systems are reviewed and  Negative to the above problem except as noted.    PHYSICAL EXAM: VS:  BP 99/67   Pulse 63   Ht 5\' 4"  (1.626 m)   Wt 154 lb 1.9 oz (69.9 kg)   BMI 26.45 kg/m   GEN: Well nourished, well developed, in no acute distress  HEENT: normal  Neck: no JVD, carotid bruits,  Cardiac: RRR; no murmurs, rubs, or gallops 1+ edema  Respiratory:  clear to auscultation bilaterally, normal work of breathing GI: soft, nontender, nondistended, + BS  No hepatomegaly  MS: no deformity Moving all extremities   Skin: warm and dry, no rash Neuro:  Strength and sensation are intact Psych: euthymic mood, full affect   EKG:  EKG is not  ordered today.   Lipid Panel    Component Value Date/Time   CHOL 122 08/11/2018 1135   TRIG 71 08/11/2018 1135   HDL 62 08/11/2018 1135   CHOLHDL 2.0 08/11/2018 1135   CHOLHDL 2.7 01/28/2018 0353   VLDL 16 01/28/2018 0353   LDLCALC 46 08/11/2018 1135      Wt Readings from Last 3 Encounters:  11/13/18 154 lb 1.9 oz (69.9 kg)  09/15/18 140 lb (63.5 kg)  08/12/18 137 lb (62.1 kg)      ASSESSMENT AND PLAN:  1  Acute on chronic diastolic CHF Will check labs today (CMET, proBNP)   Iwould increase lasix to 60 mg 2x per wek   Discussed importance fo watching salt Cut back on metoprolol to 25 mg 1x per day since BP is low/fatigued  2 CAD  I am not conviinced of active angina    I would keep on current regimen  3  HL  LDL in June 46  Keep on same mregine  4  Hypotension   CUt back on  metoprolol a little     F/U in clinic in a couple months    Current medicines are reviewed at length with the patient today.  The patient does not have concerns regarding medicines.  Signed, 02-22-1982, MD  11/13/2018 4:02 PM    [facility] [address] [place], [place], [place]  [address] Phone: [phone]; Fax: [phone]

## 2018-11-13 NOTE — Patient Instructions (Signed)
Medication Instructions:  Your physician has recommended you make the following change in your medication:  DECREASE: Metoprolol to 25 mg ONCE DAILY INCREASE: Lasix to 60 mg TWICE WEEKLY (Tues/Friday)  If you need a refill on your cardiac medications before your next appointment, please call your pharmacy.   Lab work: Your physician recommends that you return for lab work TODAY: CMET PRO BNP  If you have labs (blood work) drawn today and your tests are completely normal, you will receive your results only by: Marland Kitchen MyChart Message (if you have MyChart) OR . A paper copy in the mail If you have any lab test that is abnormal or we need to change your treatment, we will call you to review the results.  Testing/Procedures: None Ordered   Follow-Up: Your physician recommends that you schedule a follow-up appointment in: 3-4 weeks with Richardson Dopp, PA-C  Any Other Special Instructions Will Be Listed Below (If Applicable). Keep Daily Fluid Intake to less than 1.5 Liters Max Sodium Intake 2000-2500 mg daily Keep diary to keep track of these

## 2018-11-14 LAB — COMPREHENSIVE METABOLIC PANEL
ALT: 14 IU/L (ref 0–32)
AST: 24 IU/L (ref 0–40)
Albumin/Globulin Ratio: 1.9 (ref 1.2–2.2)
Albumin: 4.2 g/dL (ref 3.8–4.8)
Alkaline Phosphatase: 101 IU/L (ref 39–117)
BUN/Creatinine Ratio: 18 (ref 12–28)
BUN: 13 mg/dL (ref 8–27)
Bilirubin Total: 0.4 mg/dL (ref 0.0–1.2)
CO2: 28 mmol/L (ref 20–29)
Calcium: 8.9 mg/dL (ref 8.7–10.3)
Chloride: 101 mmol/L (ref 96–106)
Creatinine, Ser: 0.71 mg/dL (ref 0.57–1.00)
GFR calc Af Amer: 100 mL/min/{1.73_m2} (ref >59)
GFR calc non Af Amer: 87 mL/min/{1.73_m2} (ref >59)
Globulin, Total: 2.2 g/dL (ref 1.5–4.5)
Glucose: 96 mg/dL (ref 65–99)
Potassium: 4.3 mmol/L (ref 3.5–5.2)
Sodium: 141 mmol/L (ref 134–144)
Total Protein: 6.4 g/dL (ref 6.0–8.5)

## 2018-11-14 LAB — PRO B NATRIURETIC PEPTIDE: NT-Pro BNP: 309 pg/mL — ABNORMAL HIGH (ref 0–301)

## 2018-11-16 ENCOUNTER — Telehealth: Payer: Self-pay | Admitting: *Deleted

## 2018-11-16 DIAGNOSIS — I5032 Chronic diastolic (congestive) heart failure: Secondary | ICD-10-CM

## 2018-11-16 NOTE — Telephone Encounter (Signed)
Notes recorded by Fay Records, MD on 11/15/2018 at 10:53 PM EDT  Elelctrolytes, kidney and liver function are OK  FLuid is up a ltittle  Lasix was adjusted  REcomm:  Repeat BMET in 2 wks  _______________________________________________________________ Pt informed of lab results/recommendations by Dr. Harrington Challenger. She will come in on 10/5 to repeat BMET. Taking lasix 60 mg twice a week. Reminded of appt with APP on 12/09/18.  She is seeing an orthopedic surgeon for possible foot surgery and wanted to know if she should pursue or delay this for now.  I adv to keep appointments with all doctors and that if surgery is planned her chart will be reviewed and a surgical clearance will be requested by the surgeon if needed.

## 2018-11-17 ENCOUNTER — Telehealth: Payer: Self-pay | Admitting: Orthopaedic Surgery

## 2018-11-17 NOTE — Telephone Encounter (Addendum)
error 

## 2018-11-18 ENCOUNTER — Telehealth: Payer: Self-pay | Admitting: Orthopaedic Surgery

## 2018-11-18 NOTE — Telephone Encounter (Signed)
Tried to call patient. No answer. LMOM that we will not know the exact amount until coding and authorization is done through insurance.

## 2018-11-18 NOTE — Telephone Encounter (Signed)
Any thoughts on the notes below?

## 2018-11-18 NOTE — Telephone Encounter (Signed)
Which facility is he going to recommend?  The surgery will most likely cost less if scheduled at [facility], however they will want the out of pocket upfront on the day of surgery.  If the patient does not have the payment, they will cancel the surgery. [facility].  [facility] will bill patient and give them 1 year to pay in full.  If patient qualifies for assistance, [facility] may pay 75-100% of the bill.  She will have to apply for this and wait for a response. Generally takes a month or two, and sometimes longer.  Once I have the cpt code(s) for the surgery, I can call the pre-service center and provide them with the information (location, diagnosis, cpt codes), they will in turn call patient and give her the estimate.  I cannot guarantee they will reach out to her before her appointment.

## 2018-11-18 NOTE — Telephone Encounter (Signed)
Im not sure....but based on the last office note, he recommended a knee scope...is there anything I can tell her in regards to cost?

## 2018-11-18 NOTE — Telephone Encounter (Signed)
Patient called stating she scheduled an appointment with Dr. Durward Fortes tomorrow 11/19/18 to discuss surgery.  Patient is requesting a return call with the approximate cost for the surgery.  Patient states "she doesn't want to come in for the appointment and waste Dr. Rudene Anda time if she cannot afford the procedure."

## 2018-11-18 NOTE — Telephone Encounter (Signed)
Either facility is fine with me-whichever is more suitable for the patient. Remind me to give you the codes in the [place] office

## 2018-11-19 ENCOUNTER — Ambulatory Visit: Payer: Medicare Other | Admitting: Orthopaedic Surgery

## 2018-11-26 ENCOUNTER — Ambulatory Visit: Payer: Medicare Other | Admitting: Orthopaedic Surgery

## 2018-11-30 ENCOUNTER — Other Ambulatory Visit: Payer: Medicare Other

## 2018-12-01 ENCOUNTER — Other Ambulatory Visit: Payer: Self-pay

## 2018-12-01 ENCOUNTER — Encounter: Payer: Self-pay | Admitting: Orthopaedic Surgery

## 2018-12-01 ENCOUNTER — Ambulatory Visit (INDEPENDENT_AMBULATORY_CARE_PROVIDER_SITE_OTHER): Payer: Medicare Other | Admitting: Orthopaedic Surgery

## 2018-12-01 DIAGNOSIS — S83249A Other tear of medial meniscus, current injury, unspecified knee, initial encounter: Secondary | ICD-10-CM | POA: Insufficient documentation

## 2018-12-01 DIAGNOSIS — M23009 Cystic meniscus, unspecified meniscus, unspecified knee: Secondary | ICD-10-CM | POA: Diagnosis not present

## 2018-12-01 DIAGNOSIS — M1712 Unilateral primary osteoarthritis, left knee: Secondary | ICD-10-CM | POA: Diagnosis not present

## 2018-12-01 DIAGNOSIS — M23204 Derangement of unspecified medial meniscus due to old tear or injury, left knee: Secondary | ICD-10-CM | POA: Diagnosis not present

## 2018-12-01 DIAGNOSIS — M179 Osteoarthritis of knee, unspecified: Secondary | ICD-10-CM | POA: Insufficient documentation

## 2018-12-01 NOTE — Progress Notes (Signed)
Office Visit Note   Patient: Meghan Schwartz           Date of Birth: [birth year]           MRN: [id number] Visit Date: 12/01/2018              Requested by: Shirlean Mylar, MD [address] [place],  [place] [address] PCP: Shirlean Mylar, MD   Assessment & Plan: Visit Diagnoses:  1. Meniscal cyst, unspecified laterality   2. Old tear of medial meniscus of left knee, unspecified tear type     Plan:  #1: At this time because of her failure with conservative treatment our plan would be to proceed with an arthroscopic debridement of the left knee which would include that of debridement of the medial and meniscal tears, chondroplasty as indicated, and a incision and cyst removal from the lateral knee.  Procedure risk and benefits were explained by Dr. Cleophas Dunker in detail.  Also discussion of other possible treatment plans.  She is understanding and would like to proceed with surgical intervention in the very near future.  Follow-Up Instructions: Return if symptoms worsen or fail to improve, for surgery.   Face-to-face time spent with patient was greater than 40 minutes.  Greater than 50% of the time was spent in counseling and coordination of care.  Orders:  No orders of the defined types were placed in this encounter.  No orders of the defined types were placed in this encounter.     Procedures: No procedures performed   Clinical Data: No additional findings.   Subjective: Chief Complaint  Patient presents with  . Left Knee - Follow-up    HPI: Patient presents today for a four month follow up on her left knee. Patient states that there has been no changes since her last visit except more grinding in her knee. She does not take anything for pain. She wants to discuss surgery today.    Review of Systems  Constitutional: Positive for fatigue.  HENT: Negative for ear pain.   Eyes: Negative for pain.  Respiratory: Negative for shortness of breath.    Cardiovascular: Positive for leg swelling.  Gastrointestinal: Positive for constipation.  Endocrine: Negative for cold intolerance and heat intolerance.  Genitourinary: Negative for difficulty urinating.  Musculoskeletal: Positive for joint swelling.  Skin: Negative for rash.  Allergic/Immunologic: Negative for food allergies.  Neurological: Positive for weakness.  Hematological: Does not bruise/bleed easily.  Psychiatric/Behavioral: Positive for sleep disturbance.     Objective: Vital Signs: BP 114/64   Pulse 64   Physical Exam Constitutional:      Appearance: She is well-developed.  Eyes:     Pupils: Pupils are equal, round, and reactive to light.  Pulmonary:     Effort: Pulmonary effort is normal.  Skin:    General: Skin is warm and dry.  Neurological:     Mental Status: She is alert and oriented to person, place, and time.  Psychiatric:        Behavior: Behavior normal.     Ortho Exam  Continues to have a large cystic lesion along the lateral joint line.  It is fluid-filled.  She can range from 0 degrees to 100 degrees.  Continues to have some patellofemoral crepitation.  No medial joint line pain.  Specialty Comments:  No specialty comments available.  Imaging: MRI OF THE LEFT KNEE WITHOUT AND WITH CONTRAST  TECHNIQUE: Multiplanar, multisequence MR imaging of the left knee was performed both before  and after administration of intravenous contrast.  CONTRAST:  39mL MULTIHANCE GADOBENATE DIMEGLUMINE 529 MG/ML IV SOLN  Creatinine was obtained on site at [facility] at [address]  Results: Creatinine 0.7 mg/dL.  COMPARISON:  None.  FINDINGS: MENISCI  Medial meniscus: Degenerated posterior horn with longitudinal tear. Possible additional longitudinal tear of the body.  Lateral meniscus:  Degenerated body with suspected horizontal tear.  LIGAMENTS  Cruciates:  Intact.  Collaterals:  Intact.  CARTILAGE   Patellofemoral: High-grade partial and full-thickness cartilage loss over the majority of the patella and medial trochlea.  Medial:  Moderate partial-thickness cartilage loss.  Lateral: Moderate partial-thickness cartilage loss centrally. Large areas of full-thickness cartilage loss over the posterior nonweightbearing lateral femoral condyle.  MISCELLANEOUS  Joint: Moderate joint effusion with prominent synovitis. Edema within Hoffa's fat. No plical thickening.  Popliteal Fossa: Small Baker cyst with synovitis. Intact popliteus tendon.  Extensor Mechanism: Intact quadriceps tendon and patellar tendon. Proximal patellar tendinosis. Intact medial and lateral patellar retinaculum. Intact MPFL.  Bones: Patchy marrow edema in the lateral tibial plateau, lateral femoral condyle, and peripheral medial femoral condyle. No erosions. No acute fracture or dislocation. Lateral subluxation of patella. No suspicious bone lesion.  Other: Along the lateral knee, there is a 4.0 x 2.7 x 6.6 cm (AP by transverse by CC) complex, heterogeneous cystic lesion without solid enhancement. This lies adjacent to lateral meniscus body.  Diffuse nonenhancing soft tissue swelling about the knee, most prominent laterally.  IMPRESSION: 1. 4.0 x 2.7 x 6.7 cm complex, heterogeneous cystic lesion along the lateral knee, contacting the lateral meniscus body. Given underlying suspected lateral meniscus horizontal tear, this could represent a large parameniscal cyst. The differential diagnosis also includes a complex ganglion/synovial cyst, and less likely LCL bursitis. 2. Longitudinal tear of the medial meniscus posterior horn. Possible additional longitudinal tear of the body. 3. Moderate tricompartmental osteoarthritis. 4. Moderate joint effusion with prominent synovitis likely related to patient's underlying rheumatoid arthritis. No erosive changes.   Electronically Signed   By: Titus Dubin M.D.   On: 06/11/2018 14:14   PMFS History: Patient Active Problem List   Diagnosis Date Noted  . Meniscal cyst, unspecified laterality 12/01/2018  . Torn medial meniscus 12/01/2018  . Coronary artery disease involving native coronary artery of native heart without angina pectoris 09/14/2018  . Chronic diastolic (congestive) heart failure ([place])   . Chronic pain of left knee 05/15/2018  . Mass of left knee 05/15/2018  . Non-ST elevation (NSTEMI) myocardial infarction ([place])   . Chest pain 01/25/2018  . RA (rheumatoid arthritis) ([place]) 01/25/2018  . Hyperlipidemia 01/25/2018  . Hypokalemia 01/25/2018  . Depression with anxiety 01/25/2018  . Tobacco abuse 01/25/2018  . Lower extremity cellulitis 01/25/2018  . Benzodiazepine dependence ([place])   . MDD (major depressive disorder), recurrent severe, without psychosis ([place]) 10/02/2017  . CTS (carpal tunnel syndrome) 06/08/2014   Past Medical History:  Diagnosis Date  . Allergic rhinitis   . Anxiety   . Atrial fibrillation ([place])   . Benzodiazepine dependence ([place])   . Bruxism   . Chronic diastolic (congestive) heart failure (HCC)    Echocardiogram 07/2018: EF 27-78, grade 1 diastolic dysfunction, normal wall motion, normal GLS (-22.3), PASP 28  . Chronic foot pain    bunions, torn ligaments-on chronic pain medication  . CTS (carpal tunnel syndrome) 06/08/2014  . Depression   . Heart disease   . High cholesterol   . Hypertension   . MDD (major depressive disorder), recurrent  severe, without psychosis (H[facility]) 10/02/2017  . Migraine   . RA (rheumatoid arthritis) (H[facility])     Family History  Problem Relation Age of Onset  . Thyroid disease Mother   . Breast cancer Mother   . Parkinson's disease Father   . Heart attack Paternal Grandfather   . Stroke Paternal Grandmother   . Arthritis Maternal Grandmother   . Heart attack Maternal Grandmother   . Raynaud syndrome Maternal Aunt     Past Surgical History:  Procedure Laterality Date   . BTL  2000  . CARPAL TUNNEL RELEASE Left   . CESAREAN SECTION  1978. 1983  . CORONARY STENT INTERVENTION N/A 01/27/2018   Procedure: CORONARY STENT INTERVENTION;  Surgeon: Kathleene Hazel, MD;  [facility];  Service: Cardiovascular;  Laterality: N/A;  . eye implants    . FOOT SURGERY Left 2008  . front teeth removed after injury    . LEFT HEART CATH AND CORONARY ANGIOGRAPHY N/A 01/27/2018   Procedure: LEFT HEART CATH AND CORONARY ANGIOGRAPHY;  Surgeon: Kathleene Hazel, MD;  [facility];  Service: Cardiovascular;  Laterality: N/A;  . pre cancerous lesion removed from forehead    . TOTAL ABDOMINAL HYSTERECTOMY W/ BILATERAL SALPINGOOPHORECTOMY  2002   Dr. Jackelyn Knife  . VAGINAL HYSTERECTOMY  2001   Social History   Occupational History  . Occupation: Retired   Tobacco Use  . Smoking status: Current Every Day Smoker    Years: 20.00    Types: Cigarettes    Last attempt to quit: 02/25/2010    Years since quitting: 8.7  . Smokeless tobacco: Never Used  . Tobacco comment: Restarted smoking earlier this year. smokes during stressful time   Substance and Sexual Activity  . Alcohol use: No    Alcohol/week: 0.0 standard drinks  . Drug use: No  . Sexual activity: Not on file

## 2018-12-09 ENCOUNTER — Other Ambulatory Visit: Payer: Self-pay

## 2018-12-09 ENCOUNTER — Ambulatory Visit (INDEPENDENT_AMBULATORY_CARE_PROVIDER_SITE_OTHER): Payer: Medicare Other | Admitting: Physician Assistant

## 2018-12-09 ENCOUNTER — Encounter: Payer: Self-pay | Admitting: Physician Assistant

## 2018-12-09 VITALS — BP 118/68 | HR 59 | Ht 65.0 in | Wt 150.2 lb

## 2018-12-09 DIAGNOSIS — I2510 Atherosclerotic heart disease of native coronary artery without angina pectoris: Secondary | ICD-10-CM

## 2018-12-09 DIAGNOSIS — Z01810 Encounter for preprocedural cardiovascular examination: Secondary | ICD-10-CM | POA: Diagnosis not present

## 2018-12-09 DIAGNOSIS — I5032 Chronic diastolic (congestive) heart failure: Secondary | ICD-10-CM | POA: Diagnosis not present

## 2018-12-09 DIAGNOSIS — E782 Mixed hyperlipidemia: Secondary | ICD-10-CM | POA: Diagnosis not present

## 2018-12-09 MED ORDER — CLOPIDOGREL BISULFATE 75 MG PO TABS: 75.0000 mg | ORAL_TABLET | Freq: Every day | ORAL | 3 refills | Status: DC

## 2018-12-09 MED ORDER — FUROSEMIDE 40 MG PO TABS: 60.0000 mg | ORAL_TABLET | ORAL | 3 refills | Status: DC

## 2018-12-09 NOTE — Progress Notes (Signed)
Cardiology Office Note:    Date:  12/09/2018   ID:  Meghan Schwartz, DOB [birth year], MRN [id number]  PCP:  Maurice Small, MD  Cardiologist:  Dorris Carnes, MD  Electrophysiologist:  None   Referring MD: Maurice Small, MD   Chief Complaint  Patient presents with  . Follow-up    CAD, CHF    History of Present Illness:    Meghan Schwartz is a 68 y.o. female with:   Coronary artery disease  ? S/p NSTEMI 01/2018 >> DES to prox RCA  Heart failure with preserved ejection fraction  ? Hx of Leg edema, shortness of breath, BNP mildy elevated; tx with low dose Lasix (twice weekly) ? Also has L knee lat meniscus tear and ganglion cyst (may contribute some to edema)  Rheumatoid Arthritis  Hyperlipidemia   L knee para meniscal cyst (Dr. Durward Fortes)  Meghan Schwartz was last seen by Dr. Harrington Challenger in 10/2018.  She noted shortness of breath and intermittent swelling.  Dr. Harrington Challenger adjusted her Lasix to 60 mg 2 x per week.    She returns for follow-up.  She is here alone.  Her breathing is stable.  She has not had chest pain.  She has not had syncope, orthopnea.  Her legs remain swollen but are improved.  She saw her orthopedist recently.  She is contemplating knee surgery on the left given her recurrent parameniscal cyst.   Prior CV studies:   The following studies were reviewed today:  Echocardiogram 08/20/2018 EF 55-60, Gr 1 DD, GLS -22.3, PASP 28  Cardiac Catheterization12/3/19 LAD ost 20, prox 20; D1 ost 20 LCx ost 40 RCA prox 99 PCI: 3.5 x 28 mm Synergy DES to proximal RCA  Echo 01/26/18 EF 50-55; prob inf-lat and inf HK  Past Medical History:  Diagnosis Date  . Allergic rhinitis   . Anxiety   . Benzodiazepine dependence ([place])   . Bruxism   . CAD (coronary artery disease)    S/p NSTEMI 01/2018 >> LHC: oLAD 20, pLAD 20; oD1 20, oLCx 40, pRCA 99 >> PCI: DES to prox RCA // Echo 01/2018: EF 50-55; prob inf-lat and inf HK  . Chronic diastolic heart failure    Echocardiogram 07/2018: EF 55-60, grade  1 diastolic dysfunction, normal wall motion, normal GLS (-22.3), PASP 28  . Chronic foot pain    bunions, torn ligaments-on chronic pain medication  . CTS (carpal tunnel syndrome) 06/08/2014  . Depression   . High cholesterol   . Hypertension   . MDD (major depressive disorder) 10/02/2017  . Migraine   . RA (rheumatoid arthritis) ([place])    Surgical Hx: The patient  has a past surgical history that includes Foot surgery (Left, 2008); Vaginal hysterectomy (2001); Cesarean section (1978. 1983); LEFT HEART CATH AND CORONARY ANGIOGRAPHY (N/A, 01/27/2018); CORONARY STENT INTERVENTION (N/A, 01/27/2018); BTL (2000); Total abdominal hysterectomy w/ bilateral salpingoophorectomy (2002); Carpal tunnel release (Left); eye implants; front teeth removed after injury; and pre cancerous lesion removed from forehead.   Current Medications: Current Meds  Medication Sig  . Ascorbic Acid (VITAMIN C) 1000 MG tablet Take 1,000 mg by mouth daily.  Marland Kitchen aspirin 81 MG chewable tablet Chew 1 tablet (81 mg total) by mouth daily.  . Buprenorphine HCl-Naloxone HCl 5.7-1.4 MG SUBL Place under the tongue.  . Cholecalciferol (VITAMIN D3 PO) Take 2,000 Int'l Units by mouth daily.  . clonazePAM (KLONOPIN) 1 MG tablet Take 1 mg by mouth at bedtime.  . cloNIDine (CATAPRES) 0.1 MG tablet Take 0.1  mg by mouth as directed. Up to three daily as needed  . escitalopram (LEXAPRO) 10 MG tablet Take 15 mg by mouth daily.  Marland Kitchen estradiol (VIVELLE-DOT) 0.075 MG/24HR Place 1 patch onto the skin 2 (two) times a week. Tuesday and Friday  . etanercept (ENBREL) 50 MG/ML injection Inject 50 mg into the skin every Friday. Self injection once per week   . fexofenadine (ALLEGRA) 180 MG tablet Take 180 mg by mouth daily.  . hydroxychloroquine (PLAQUENIL) 200 MG tablet Take 200 mg by mouth 2 (two) times daily.   . hydrOXYzine (ATARAX/VISTARIL) 25 MG tablet Take 1 tablet (25 mg total) by mouth every 6 (six) hours as needed for anxiety.  . metoprolol  tartrate (LOPRESSOR) 25 MG tablet Take 1 tablet (25 mg total) by mouth daily.  . nitroGLYCERIN (NITROSTAT) 0.4 MG SL tablet Place 1 tablet (0.4 mg total) under the tongue every 5 (five) minutes as needed for chest pain.  . potassium chloride SA (KLOR-CON) 20 MEQ tablet Take 20 mEq by mouth 3 (three) times a week.  . rosuvastatin (CRESTOR) 20 MG tablet Take 1 tablet (20 mg total) by mouth daily.  . [DISCONTINUED] furosemide (LASIX) 40 MG tablet Take 1.5 tablets (60 mg total) by mouth 2 (two) times a week. Every Tuesday and Friday     Allergies:   Erythromycin   Social History   Tobacco Use  . Smoking status: Current Every Day Smoker    Years: 20.00    Types: Cigarettes    Last attempt to quit: 02/25/2010    Years since quitting: 8.7  . Smokeless tobacco: Never Used  . Tobacco comment: Restarted smoking earlier this year. smokes during stressful time   Substance Use Topics  . Alcohol use: No    Alcohol/week: 0.0 standard drinks  . Drug use: No     Family Hx: The patient's family history includes Arthritis in her maternal grandmother; Breast cancer in her mother; Heart attack in her maternal grandmother and paternal grandfather; Parkinson's disease in her father; Raynaud syndrome in her maternal aunt; Stroke in her paternal grandmother; Thyroid disease in her mother.  ROS:   Please see the history of present illness.    ROS All other systems reviewed and are negative.   EKGs/Labs/Other Test Reviewed:    EKG:  EKG is not ordered today.  The ekg ordered today demonstrates n/a  Recent Labs: 01/25/2018: Magnesium 2.0 08/11/2018: TSH 1.610 09/15/2018: Hemoglobin 12.6; Platelets 177 11/13/2018: ALT 14; BUN 13; Creatinine, Ser 0.71; NT-Pro BNP 309; Potassium 4.3; Sodium 141   Recent Lipid Panel Lab Results  Component Value Date/Time   CHOL 122 08/11/2018 11:35 AM   TRIG 71 08/11/2018 11:35 AM   HDL 62 08/11/2018 11:35 AM   CHOLHDL 2.0 08/11/2018 11:35 AM   CHOLHDL 2.7 01/28/2018  03:53 AM   LDLCALC 46 08/11/2018 11:35 AM    Physical Exam:    VS:  BP 118/68   Pulse (!) 59   Ht 5\' 5"  (1.651 m)   Wt 150 lb 3.2 oz (68.1 kg)   SpO2 95%   BMI 24.99 kg/m     Wt Readings from Last 3 Encounters:  12/09/18 150 lb 3.2 oz (68.1 kg)  11/13/18 154 lb 1.9 oz (69.9 kg)  09/15/18 140 lb (63.5 kg)     Physical Exam  Constitutional: She is oriented to person, place, and time. She appears well-developed and well-nourished. No distress.  HENT:  Head: Normocephalic and atraumatic.  Eyes: No scleral icterus ( ).  Neck: No thyromegaly present.  Cardiovascular: Normal rate and regular rhythm.  No murmur heard. Pulmonary/Chest: Effort normal. She has no rales.  Abdominal: Soft.  Musculoskeletal:        General: Edema (1+ bilat LE edema) present.     Comments: Lg cyst lat L knee  Lymphadenopathy:    She has no cervical adenopathy.  Neurological: She is alert and oriented to person, place, and time.  Skin: Skin is warm and dry.  Psychiatric: She has a normal mood and affect.    ASSESSMENT & PLAN:    1. Coronary artery disease involving native coronary artery of native heart without angina pectoris S/p NSTEMI in 01/2018 tx with DES to the RCA. She is doing well without angina.  She was changed from Brilinta to Plavix due to shortness of breath.  Plavix is no longer on her medication list.  She has dementia and her husband takes care of her medications.  We will send in a Rx for Plavix 75 mg once daily to her pharmacy just in case and she will confirm that she is taking Plavix when she gets home.  Continue ASA, Plavix, beta-blocker, statin.  FU with me or Dr. Tenny Craw in Jan 2021.   2. Chronic diastolic (congestive) heart failure (HCC) Volume stable.  She still has some leg swelling that I suspect is multifactorial.  She does not like taking Furosemide on a daily basis due to frequent urination. I advised her to increase her Lasix to 60 mg 3 times a week along with K+ 20 mEq 3  times a week.  She can also wear compression stockings and keep her legs elevated.  Check a BMET in 2 weeks.   3. Mixed hyperlipidemia LDL optimal on most recent lab work.  Continue current Rx.     4. Surgical clearance She cannot stop Plavix until she is 1 year out from her myocardial infarction.  Therefore, surgery should be postponed until after the 1st week of December.  At that point, she could stop Plavix and remain on ASA alone.  Her risk of perioperative major cardiac event is high at 6.6% according to the Revised Cardia Risk Index.  Her functional level is good at 4.06 METs according to the [facility] Activity Status Index.  Therefore, she would not require further CV testing.     Dispo:  Return in about 3 months (around 03/11/2019) for Routine Follow Up, w/ Dr. Tenny Craw, or Tereso Newcomer, PA-C, (virtual or in-person).   Medication Adjustments/Labs and Tests Ordered: Current medicines are reviewed at length with the patient today.  Concerns regarding medicines are outlined above.  Tests Ordered: Orders Placed This Encounter  Procedures  . Basic metabolic panel   Medication Changes: Meds ordered this encounter  Medications  . furosemide (LASIX) 40 MG tablet    Sig: Take 1.5 tablets (60 mg total) by mouth 3 (three) times a week.    Dispense:  54 tablet    Refill:  3  . clopidogrel (PLAVIX) 75 MG tablet    Sig: Take 1 tablet (75 mg total) by mouth daily.    Dispense:  90 tablet    Refill:  3    Signed, Tereso Newcomer, PA-C  12/09/2018 4:06 PM    [facility] [address] [place], [place], [place]  [address] Phone: [phone]; Fax: [phone]

## 2018-12-09 NOTE — Patient Instructions (Addendum)
Medication Instructions:  Your physician has recommended you make the following change in your medication:  1. INCREASE LASIX TO 60 MG 3 TIMES DAYS PER WEEK.  2. INCREASE POTASSIUM 3 TIMES PER WEEK ALONG WITH LASIX.  3. TAKE PLAVIX 75 MG DAILY.  If you need a refill on your cardiac medications before your next appointment, please call your pharmacy.   Lab work: TO BE DONE IN 2 WEEKS: BMET on 10/28 If you have labs (blood work) drawn today and your tests are completely normal, you will receive your results only by: Marland Kitchen MyChart Message (if you have MyChart) OR . A paper copy in the mail If you have any lab test that is abnormal or we need to change your treatment, we will call you to review the results.  Testing/Procedures: NONE  Follow-Up: At [facility], you and your health needs are our priority.  As part of our continuing mission to provide you with exceptional heart care, we have created designated Provider Care Teams.  These Care Teams include your primary Cardiologist (physician) and Advanced Practice Providers (APPs -  Physician Assistants and Nurse Practitioners) who all work together to provide you with the care you need, when you need it. You will need a follow up appointment on 03/15/19 at 3:00 pm with  Dorris Carnes, MD

## 2018-12-14 ENCOUNTER — Telehealth: Payer: Self-pay | Admitting: Internal Medicine

## 2018-12-14 NOTE — Telephone Encounter (Signed)
New message:     Patient calling stating that she needs to have surgery on her knee. Patient had a heart attack last December. Patient has some questions. Please call patient back

## 2018-12-14 NOTE — Telephone Encounter (Signed)
Left message for patient to call back  

## 2018-12-17 NOTE — Telephone Encounter (Signed)
Left message for patient to call back if there is anything we can help her with.

## 2018-12-19 ENCOUNTER — Other Ambulatory Visit: Payer: Self-pay

## 2018-12-19 ENCOUNTER — Emergency Department (HOSPITAL_BASED_OUTPATIENT_CLINIC_OR_DEPARTMENT_OTHER)
Admission: EM | Admit: 2018-12-19 | Discharge: 2018-12-19 | Disposition: A | Discharge status: Home / Self Care | Payer: Medicare Other | Attending: Emergency Medicine | Admitting: Emergency Medicine

## 2018-12-19 ENCOUNTER — Emergency Department (HOSPITAL_BASED_OUTPATIENT_CLINIC_OR_DEPARTMENT_OTHER): Payer: Medicare Other

## 2018-12-19 ENCOUNTER — Encounter (HOSPITAL_BASED_OUTPATIENT_CLINIC_OR_DEPARTMENT_OTHER): Payer: Self-pay

## 2018-12-19 DIAGNOSIS — R519 Headache, unspecified: Secondary | ICD-10-CM | POA: Insufficient documentation

## 2018-12-19 DIAGNOSIS — Y9301 Activity, walking, marching and hiking: Secondary | ICD-10-CM | POA: Insufficient documentation

## 2018-12-19 DIAGNOSIS — S0990XA Unspecified injury of head, initial encounter: Secondary | ICD-10-CM | POA: Diagnosis not present

## 2018-12-19 DIAGNOSIS — I5032 Chronic diastolic (congestive) heart failure: Secondary | ICD-10-CM | POA: Insufficient documentation

## 2018-12-19 DIAGNOSIS — I110 Hypertensive heart disease with heart failure: Secondary | ICD-10-CM | POA: Insufficient documentation

## 2018-12-19 DIAGNOSIS — Z79899 Other long term (current) drug therapy: Secondary | ICD-10-CM | POA: Diagnosis not present

## 2018-12-19 DIAGNOSIS — W01190A Fall on same level from slipping, tripping and stumbling with subsequent striking against furniture, initial encounter: Secondary | ICD-10-CM | POA: Diagnosis not present

## 2018-12-19 DIAGNOSIS — Y999 Unspecified external cause status: Secondary | ICD-10-CM | POA: Diagnosis not present

## 2018-12-19 DIAGNOSIS — Y92009 Unspecified place in unspecified non-institutional (private) residence as the place of occurrence of the external cause: Secondary | ICD-10-CM | POA: Insufficient documentation

## 2018-12-19 DIAGNOSIS — S81802A Unspecified open wound, left lower leg, initial encounter: Secondary | ICD-10-CM | POA: Diagnosis not present

## 2018-12-19 DIAGNOSIS — Z7982 Long term (current) use of aspirin: Secondary | ICD-10-CM | POA: Diagnosis not present

## 2018-12-19 DIAGNOSIS — I2510 Atherosclerotic heart disease of native coronary artery without angina pectoris: Secondary | ICD-10-CM | POA: Insufficient documentation

## 2018-12-19 DIAGNOSIS — F17210 Nicotine dependence, cigarettes, uncomplicated: Secondary | ICD-10-CM | POA: Diagnosis not present

## 2018-12-19 MED ORDER — DOXYCYCLINE HYCLATE 100 MG PO CAPS: 100.0000 mg | ORAL_CAPSULE | Freq: Two times a day (BID) | ORAL | 0 refills | Status: DC

## 2018-12-19 NOTE — ED Notes (Signed)
ED Provider at bedside. 

## 2018-12-19 NOTE — ED Triage Notes (Signed)
Pt states on 10/16 she fell "over the bannister" and hit her head. Pt c/o worsening HA, neck pain, and L knee pain. Pt reports some periods of disorientation at times. Pt is on Plavix. Pt did not seek treatment at time of injury.

## 2018-12-19 NOTE — ED Provider Notes (Signed)
[facility] EMERGENCY DEPARTMENT Provider Note   CSN: [id number] Arrival date & time: 12/19/18  1325     History   Chief Complaint Chief Complaint  Patient presents with   Head Injury    HPI Meghan Schwartz is a 68 y.o. female.     68 year old female with past medical history below who presents with headaches after head injury.  10/16, she was carrying towels to the laundry in her house when she tripped and fell over the banister, striking the back of her head on a piece of wooden furniture.  She did not lose consciousness.  She spoke with her PCP at that time and decided not to go to the ED. she reports that she has had persistent headaches and generalized neck pain since the fall.  She reports occasional mild balance problems.  She has had some problems with her memory in the past but denies any sudden changes.  No vomiting or vision changes.  She reports ongoing problems with lower extremity edema for which she wears compression stockings.  A few days ago, she struck her left lower leg while getting into the car and has had some weeping from the wound since then.  She reports history of leg cellulitis.  The history is provided by the patient.  Head Injury   Past Medical History:  Diagnosis Date   Allergic rhinitis    Anxiety    Benzodiazepine dependence (HCC)    Bruxism    CAD (coronary artery disease)    S/p NSTEMI 01/2018 >> LHC: oLAD 20, pLAD 20; oD1 20, oLCx 40, pRCA 99 >> PCI: DES to prox RCA // Echo 01/2018: EF 50-55; prob inf-lat and inf HK   Chronic diastolic heart failure    Echocardiogram 07/2018: EF 55-60, grade 1 diastolic dysfunction, normal wall motion, normal GLS (-22.3), PASP 28   Chronic foot pain    bunions, torn ligaments-on chronic pain medication   CTS (carpal tunnel syndrome) 06/08/2014   Depression    High cholesterol    Hypertension    MDD (major depressive disorder) 10/02/2017   Migraine    RA (rheumatoid arthritis) (HCC)       Patient Active Problem List   Diagnosis Date Noted   Meniscal cyst, unspecified laterality 12/01/2018   Torn medial meniscus 12/01/2018   Primary osteoarthritis of left knee 12/01/2018   Coronary artery disease involving native coronary artery of native heart without angina pectoris 09/14/2018   Chronic diastolic (congestive) heart failure (HCC)    Chronic pain of left knee 05/15/2018   Mass of left knee 05/15/2018   Non-ST elevation (NSTEMI) myocardial infarction [facility])    Chest pain 01/25/2018   RA (rheumatoid arthritis) (HCC) 01/25/2018   Hyperlipidemia 01/25/2018   Hypokalemia 01/25/2018   Depression with anxiety 01/25/2018   Tobacco abuse 01/25/2018   Lower extremity cellulitis 01/25/2018   Benzodiazepine dependence (HCC)    MDD (major depressive disorder), recurrent severe, without psychosis (HCC) 10/02/2017   CTS (carpal tunnel syndrome) 06/08/2014    Past Surgical History:  Procedure Laterality Date   BTL  2000   CARPAL TUNNEL RELEASE Left    CESAREAN SECTION  1978. 1983   CORONARY STENT INTERVENTION N/A 01/27/2018   Procedure: CORONARY STENT INTERVENTION;  Surgeon: Kathleene Hazel, MD;  Location: [facility] INVASIVE CV LAB;  Service: Cardiovascular;  Laterality: N/A;   eye implants     FOOT SURGERY Left 2008   front teeth removed after injury     LEFT  HEART CATH AND CORONARY ANGIOGRAPHY N/A 01/27/2018   Procedure: LEFT HEART CATH AND CORONARY ANGIOGRAPHY;  Surgeon: Kathleene HazelMcAlhany, Christopher D, MD;  Location: [facility] INVASIVE CV LAB;  Service: Cardiovascular;  Laterality: N/A;   pre cancerous lesion removed from forehead     TOTAL ABDOMINAL HYSTERECTOMY W/ BILATERAL SALPINGOOPHORECTOMY  2002   Dr. Jackelyn KnifeMeisinger   VAGINAL HYSTERECTOMY  2001     OB History   No obstetric history on file.      Home Medications    Prior to Admission medications   Medication Sig Start Date End Date Taking? Authorizing Provider  Ascorbic Acid (VITAMIN C) 1000 MG  tablet Take 1,000 mg by mouth daily.   Yes [provider]  aspirin 81 MG chewable tablet Chew 1 tablet (81 mg total) by mouth daily. 01/28/18  Yes Rai, Ripudeep K, MD  clonazePAM (KLONOPIN) 1 MG tablet Take 1 mg by mouth at bedtime. 12/31/17  Yes [provider]  cloNIDine (CATAPRES) 0.1 MG tablet Take 0.1 mg by mouth as directed. Up to three daily as needed   Yes [provider]  escitalopram (LEXAPRO) 10 MG tablet Take 15 mg by mouth daily.   Yes [provider]  etanercept (ENBREL) 50 MG/ML injection Inject 50 mg into the skin every Friday. Self injection once per week    Yes [provider]  hydroxychloroquine (PLAQUENIL) 200 MG tablet Take 200 mg by mouth 2 (two) times daily.    Yes [provider]  hydrOXYzine (ATARAX/VISTARIL) 25 MG tablet Take 1 tablet (25 mg total) by mouth every 6 (six) hours as needed for anxiety. 10/05/17  Yes Starkes-Perry, Juel Burrowakia S, FNP  metoprolol tartrate (LOPRESSOR) 25 MG tablet Take 1 tablet (25 mg total) by mouth daily. 11/13/18  Yes Pricilla Riffleoss, Paula V, MD  rosuvastatin (CRESTOR) 20 MG tablet Take 1 tablet (20 mg total) by mouth daily. 03/03/18 03/03/19 Yes Weaver, Scott T, PA-C  doxycycline (VIBRAMYCIN) 100 MG capsule Take 1 capsule (100 mg total) by mouth 2 (two) times daily. 12/19/18   Shayli Altemose, Ambrose Finlandachel Morgan, MD  estradiol (VIVELLE-DOT) 0.075 MG/24HR Place 1 patch onto the skin 2 (two) times a week. Tuesday and Friday    [provider]  furosemide (LASIX) 40 MG tablet Take 1.5 tablets (60 mg total) by mouth 3 (three) times a week. 12/09/18 03/09/19  Tereso NewcomerWeaver, Scott T, PA-C  nitroGLYCERIN (NITROSTAT) 0.4 MG SL tablet Place 1 tablet (0.4 mg total) under the tongue every 5 (five) minutes as needed for chest pain. 01/28/18   Rai, Ripudeep K, MD  potassium chloride SA (KLOR-CON) 20 MEQ tablet Take 20 mEq by mouth 3 (three) times a week.    [provider]    Family History Family History  Problem Relation Age  of Onset   Thyroid disease Mother    Breast cancer Mother    Parkinson's disease Father    Heart attack Paternal Grandfather    Stroke Paternal Grandmother    Arthritis Maternal Grandmother    Heart attack Maternal Grandmother    Raynaud syndrome Maternal Aunt     Social History Social History   Tobacco Use   Smoking status: Current Every Day Smoker    Years: 20.00    Types: Cigarettes    Last attempt to quit: 02/25/2010    Years since quitting: 8.8   Smokeless tobacco: Never Used   Tobacco comment: Restarted smoking earlier this year. smokes during stressful time   Substance Use Topics   Alcohol use: No  Alcohol/week: 0.0 standard drinks   Drug use: No     Allergies   Erythromycin   Review of Systems Review of Systems All other systems reviewed and are negative except that which was mentioned in HPI   Physical Exam Updated Vital Signs BP 119/74 (BP Location: Left Arm)    Pulse 75    Temp 98.7 F (37.1 C) (Oral)    Resp 20    Ht 5\' 5"  (1.651 m)    Wt 68 kg    SpO2 98%    BMI 24.96 kg/m   Physical Exam Vitals signs and nursing note reviewed.  Constitutional:      General: She is not in acute distress.    Appearance: She is well-developed.     Comments: Awake, alert  HENT:     Head: Normocephalic and atraumatic.  Eyes:     Extraocular Movements: Extraocular movements intact.     Conjunctiva/sclera: Conjunctivae normal.     Pupils: Pupils are equal, round, and reactive to light.  Neck:     Musculoskeletal: Neck supple.  Pulmonary:     Effort: Pulmonary effort is normal.  Musculoskeletal:     Right lower leg: Edema present.     Left lower leg: Edema present.     Comments: Mild pitting edema BLE  Skin:    General: Skin is warm and dry.     Comments: L mid-shin w/ superficial wound, no active drainage; faint surrounding warmth and redness  Neurological:     Mental Status: She is alert and oriented to person, place, and time.     Cranial  Nerves: No cranial nerve deficit.     Motor: No abnormal muscle tone.     Deep Tendon Reflexes: Reflexes are normal and symmetric.     Comments: Fluent speech, normal finger-to-nose testing, negative pronator drift, no clonus 5/5 strength and normal sensation x all 4 extremities  Psychiatric:        Thought Content: Thought content normal.        Judgment: Judgment normal.      ED Treatments / Results  Labs (all labs ordered are listed, but only abnormal results are displayed) Labs Reviewed - No data to display  EKG None  Radiology Ct Head Wo Contrast  Result Date: 12/19/2018 CLINICAL DATA:  Fall onto head 1 week prior. Anticoagulated. Persistent headache. EXAM: CT HEAD WITHOUT CONTRAST CT CERVICAL SPINE WITHOUT CONTRAST TECHNIQUE: Multidetector CT imaging of the head and cervical spine was performed following the standard protocol without intravenous contrast. Multiplanar CT image reconstructions of the cervical spine were also generated. COMPARISON:  07/15/2018 brain MRI. 10/02/2017 head CT. FINDINGS: CT HEAD FINDINGS Brain: No evidence of parenchymal hemorrhage or extra-axial fluid collection. No mass lesion, mass effect, or midline shift. No CT evidence of acute infarction. Cerebral volume is age appropriate. No ventriculomegaly. Vascular: No acute abnormality. Skull: No evidence of calvarial fracture. Sinuses/Orbits: The visualized paranasal sinuses are essentially clear. Other: The mastoid air cells are unopacified. Tiny superficial subcutaneous 0.8 cm hypodense lesion in lateral right temple (series 2/image 10), unchanged and nonspecific, favoring a benign lesion such as a sebaceous cyst. CT CERVICAL SPINE FINDINGS Limited motion degraded scan. Alignment: Straightening of the cervical spine. No facet subluxation. Dens is well positioned between the lateral masses of C1. Mild 3 mm anterolisthesis C4-5 and C5-6. Skull base and vertebrae: No acute fracture. No primary bone lesion or focal  pathologic process. Soft tissues and spinal canal: No prevertebral edema. No visible canal  hematoma. Disc levels: Moderate multilevel degenerative changes throughout the cervical spine, most prominent at C6-7. Moderate to severe bilateral facet arthropathy. Mild degenerative foraminal stenosis on the right at C3-4 and C4-5. Upper chest: No acute abnormality. Other: Visualized mastoid air cells appear clear. No discrete thyroid nodules. No pathologically enlarged cervical nodes. IMPRESSION: 1. Negative head CT. No evidence of acute intracranial abnormality. No evidence of calvarial fracture. 2. Limited motion degraded cervical spine CT. No evidence of cervical spine fracture or facet subluxation. 3. Moderate multilevel degenerative changes in the cervical spine as detailed. Electronically Signed   By: Delbert Phenix M.D.   On: 12/19/2018 14:17   Ct Cervical Spine Wo Contrast  Result Date: 12/19/2018 CLINICAL DATA:  Fall onto head 1 week prior. Anticoagulated. Persistent headache. EXAM: CT HEAD WITHOUT CONTRAST CT CERVICAL SPINE WITHOUT CONTRAST TECHNIQUE: Multidetector CT imaging of the head and cervical spine was performed following the standard protocol without intravenous contrast. Multiplanar CT image reconstructions of the cervical spine were also generated. COMPARISON:  07/15/2018 brain MRI. 10/02/2017 head CT. FINDINGS: CT HEAD FINDINGS Brain: No evidence of parenchymal hemorrhage or extra-axial fluid collection. No mass lesion, mass effect, or midline shift. No CT evidence of acute infarction. Cerebral volume is age appropriate. No ventriculomegaly. Vascular: No acute abnormality. Skull: No evidence of calvarial fracture. Sinuses/Orbits: The visualized paranasal sinuses are essentially clear. Other: The mastoid air cells are unopacified. Tiny superficial subcutaneous 0.8 cm hypodense lesion in lateral right temple (series 2/image 10), unchanged and nonspecific, favoring a benign lesion such as a sebaceous  cyst. CT CERVICAL SPINE FINDINGS Limited motion degraded scan. Alignment: Straightening of the cervical spine. No facet subluxation. Dens is well positioned between the lateral masses of C1. Mild 3 mm anterolisthesis C4-5 and C5-6. Skull base and vertebrae: No acute fracture. No primary bone lesion or focal pathologic process. Soft tissues and spinal canal: No prevertebral edema. No visible canal hematoma. Disc levels: Moderate multilevel degenerative changes throughout the cervical spine, most prominent at C6-7. Moderate to severe bilateral facet arthropathy. Mild degenerative foraminal stenosis on the right at C3-4 and C4-5. Upper chest: No acute abnormality. Other: Visualized mastoid air cells appear clear. No discrete thyroid nodules. No pathologically enlarged cervical nodes. IMPRESSION: 1. Negative head CT. No evidence of acute intracranial abnormality. No evidence of calvarial fracture. 2. Limited motion degraded cervical spine CT. No evidence of cervical spine fracture or facet subluxation. 3. Moderate multilevel degenerative changes in the cervical spine as detailed. Electronically Signed   By: Delbert Phenix M.D.   On: 12/19/2018 14:17    Procedures Procedures (including critical care time)  Medications Ordered in ED Medications - No data to display   Initial Impression / Assessment and Plan / ED Course  I have reviewed the triage vital signs and the nursing notes.  Pertinent  imaging results that were available during my care of the patient were reviewed by me and considered in my medical decision making (see chart for details).       Neurologically intact and well-appearing on exam.  CT of head and cervical spine negative acute.  Given that she is over a week out from her head injury with reassuring head CT, I feel that her symptoms most likely represent postconcussive syndrome.  I have discussed supportive measures including brain rest and follow-up with PCP if symptoms are persistent.   Regarding her lower leg, she has a superficial wound that is not actively draining but she has some faint surrounding erythema.  Because  of her history of cellulitis and current appearance, will start on antibiotic course.  I have discussed wound care and extensively reviewed return precautions with her.  She voiced understanding.  Final Clinical Impressions(s) / ED Diagnoses   Final diagnoses:  Injury of head, initial encounter  Open wound of left lower leg, initial encounter    ED Discharge Orders         Ordered    doxycycline (VIBRAMYCIN) 100 MG capsule  2 times daily     12/19/18 1519           Jameson Morrow, Ambrose Finland, MD 12/19/18 1529

## 2018-12-23 ENCOUNTER — Other Ambulatory Visit: Payer: Medicare Other

## 2018-12-23 ENCOUNTER — Other Ambulatory Visit: Payer: Self-pay

## 2018-12-24 ENCOUNTER — Other Ambulatory Visit: Payer: Medicare Other | Admitting: *Deleted

## 2018-12-24 DIAGNOSIS — I5032 Chronic diastolic (congestive) heart failure: Secondary | ICD-10-CM

## 2018-12-24 DIAGNOSIS — E782 Mixed hyperlipidemia: Secondary | ICD-10-CM

## 2018-12-24 DIAGNOSIS — I2510 Atherosclerotic heart disease of native coronary artery without angina pectoris: Secondary | ICD-10-CM

## 2018-12-25 ENCOUNTER — Telehealth: Payer: Self-pay

## 2018-12-25 LAB — BASIC METABOLIC PANEL
BUN/Creatinine Ratio: 19 (ref 12–28)
BUN: 14 mg/dL (ref 8–27)
CO2: 30 mmol/L — ABNORMAL HIGH (ref 20–29)
Calcium: 8.9 mg/dL (ref 8.7–10.3)
Chloride: 99 mmol/L (ref 96–106)
Creatinine, Ser: 0.75 mg/dL (ref 0.57–1.00)
GFR calc Af Amer: 94 mL/min/{1.73_m2} (ref >59)
GFR calc non Af Amer: 82 mL/min/{1.73_m2} (ref >59)
Glucose: 98 mg/dL (ref 65–99)
Potassium: 3.8 mmol/L (ref 3.5–5.2)
Sodium: 141 mmol/L (ref 134–144)

## 2018-12-25 NOTE — Telephone Encounter (Signed)
Patient was cleared during recent office visit on 10/14. Per office note "She cannot stop Plavix until she is 1 year out from her myocardial infarction.  Therefore, surgery should be postponed until after the 1st week of December.  At that point, she could stop Plavix and remain on ASA alone.  Her risk of perioperative major cardiac event is high at 6.6% according to the Revised Cardia Risk Index.  Her functional level is good at 4.06 METs according to the Duke Activity Status Index.  Therefore, she would not require further CV testing."   I will need to call the patient on Monday to verify if she is still taking plavix. She had PCI of RCA in 01/2018. Last seen by Richardson Dopp PA-C on 10/14, however it was noted that plavix was not on her medication list at the time, given the fact she had PCI < 1 year, Nicki Reaper has restarted her on plavix.   She went to ED on 10/24 after a fall. Plavix was taken off of her med list again on that day by ED nurse. It is likely that the patient told ED nurse who went over her med list that she was not on plavix. Scott noted the patient has dementia and it is her husband who manages her meds.

## 2018-12-25 NOTE — Telephone Encounter (Signed)
   [facility] Pre-operative Risk Assessment    Request for surgical clearance:  1. What type of surgery is being performed? Left knee arthroscopy, meniscal cyst excision  2. When is this surgery scheduled? 01/14/19   3. What type of clearance is required (medical clearance vs. Pharmacy clearance to hold med vs. Both)? Medical  4. Are there any medications that need to be held prior to surgery and how long? No    5. Practice name and name of physician performing surgery? [facility]/Dr. Joni Fears   6. What is your office phone number [phone]    7.   What is your office fax number [phone]  8.   Anesthesia type (None, local, MAC, general) ? Choice   Meghan Schwartz 12/25/2018, 10:25 AM  _________________________________________________________________   (provider comments below)

## 2018-12-28 NOTE — Telephone Encounter (Signed)
Left message for the patient to call back and speak to the on call preop APP of the day in order to verify if she is still taking plavix

## 2018-12-29 ENCOUNTER — Telehealth: Payer: Self-pay | Admitting: Orthopaedic Surgery

## 2018-12-29 NOTE — Telephone Encounter (Signed)
Left message on patient's voice mail to call about scheduling her left knee surgery.  A request for cardia clearance was sent to Dr. Dorris Carnes.  Patient will need to wait until after the first week in December 2020 to hold her Plavix.  Patient is not 1 year our from her myocardial infarction.

## 2018-12-29 NOTE — Telephone Encounter (Signed)
Pt has been notified of lab results by phone with verbal understanding. Pt also asked about her surgery clearance. Patient notified of result.  Please refer to phone note from today for complete details.   Julaine Hua, [place] 12/29/2018 8:38 AM   In regards to her surgery clearance I did explain to her that we were needing to verify if she was still taking Plavix. Plavix not listed on med list. Pt confirms she is taking Plavix 75 mg daily. I will update med list. Pt wanted to also let us know that her Rheumatologist also advised her to stop Enbrel, due to her leg wounds not healing. Pt asked for me to please update her med list no taking Enbrel. I assured the pt that I will let the Pre Op Provider know that she is taking Plavix and they further assess her clearance. I assured the pt once we have given clearance we will let the surgeon's office know. Pt thanked me for the call.

## 2018-12-29 NOTE — Telephone Encounter (Signed)
   Primary Cardiologist: Dorris Carnes, MD  Chart reviewed as part of pre-operative protocol coverage. Patient was cleared during recent office visit on 10/14. Per office note "She cannot stop Plavix until she is 1 year out from hermyocardial infarction. Therefore, surgery should be postponed until after the 1st week of December. At that point, she could stop Plavix and remain on ASA alone. Her risk of perioperative major cardiac event is high at 6.6% according to the Revised Cardia Risk Index. Her functional level is good at 4.06 METs according to the Duke Activity Status Index."   Patient was contacted 12/29/2018 in reference to her Plavix therapy as there has been confusion whether she is taking this or not. Pt did confirm that she has been taking Plavix 75mg  PO QD.   Pre-op covering staff: - Please contact requesting surgeon's office via preferred method (i.e, phone, fax) to inform them of the need to wait until after the first week in December 2020 to hold Plavix. See notation above from clearance visit.   [name], NP 12/29/2018, 9:12 AM

## 2018-12-30 NOTE — Telephone Encounter (Signed)
   Primary Cardiologist: Dorris Carnes, MD  Chart reviewed as part of pre-operative protocol coverage. Given past medical history and time since last visit, based on ACC/AHA guidelines, Meghan Schwartz would be at acceptable risk for the planned procedure without further cardiovascular testing.   She was last seen on 12/01/2018 for pre-operative clearance for upcoming procedure. She will not be able to stop Plavix until early December 2020 however per surgical clearance form, no medications will need to be held in the pre-operative or intra-operative period therefore based on her clearance evaluation, she is at acceptable risk to proceed without further cardiac testing. Her risk of perioperative major cardiac event is high at 6.6% according to the Revised Cardia Risk Index.  Her functional level is good at 4.06 METs according to the Duke Activity Status Index.   I will route this recommendation to the requesting party via Epic fax function and remove from pre-op pool.  Please call with questions.  Meghan Drown, NP 12/30/2018, 10:31 AM

## 2018-12-31 ENCOUNTER — Telehealth: Payer: Self-pay | Admitting: Orthopaedic Surgery

## 2018-12-31 ENCOUNTER — Other Ambulatory Visit: Payer: Self-pay

## 2018-12-31 NOTE — Telephone Encounter (Signed)
Patient is scheduled December 8th at [facility] for left knee arthroscopy, medial lateral meniscectomy, with excision of left lateral meniscal cyst.  Cardiac clearance has been received from Dr. Harrington Challenger. (see notes from [name], NP on 12-29-18 including instructions for Plavix ).  Patient's risk of perioperative major cardiac event is high at 6.6%. for this reason patient cannot be scheduled at [place].     Patient has requested Dr. Durward Fortes contact the following nurse practitioner  (Counselor) so she is aware of the Rx that will be provided to patient for post operative recovery.  Patient can be reached at [phone] should there be any questions regarding this request.      Deveron Furlong, BSN, AGNP NURSE PRACTITIONER [phone]   Deveron Furlong is a certified Adult Gerontology Nurse Practitioner with expertise in providing exceptional quality care in multiple medical disciplines. She is a certified Substance Use Disorder provider.Her services include:    Depression ADHD Womens issues Anxiety Bipolar disorder Chronic mental illness Follow up care Substance Use Disorder PMAD  (Postpartum Mood and Anxiety Disorder)

## 2018-12-31 NOTE — Telephone Encounter (Signed)
FYI

## 2018-12-31 NOTE — Telephone Encounter (Signed)
thanks

## 2019-01-05 ENCOUNTER — Other Ambulatory Visit (HOSPITAL_COMMUNITY): Payer: Self-pay | Admitting: Family Medicine

## 2019-01-05 DIAGNOSIS — I999 Unspecified disorder of circulatory system: Secondary | ICD-10-CM

## 2019-01-05 NOTE — Telephone Encounter (Signed)
Need to call her any day but Tuesday

## 2019-01-05 NOTE — Telephone Encounter (Signed)
Please call.

## 2019-01-07 ENCOUNTER — Encounter (HOSPITAL_COMMUNITY): Payer: Self-pay

## 2019-01-07 ENCOUNTER — Ambulatory Visit (HOSPITAL_COMMUNITY)
Admission: RE | Admit: 2019-01-07 | Discharge: 2019-01-07 | Disposition: A | Discharge status: Home / Self Care | Payer: Medicare Other | Source: Ambulatory Visit | Attending: Family Medicine | Admitting: Family Medicine

## 2019-01-07 ENCOUNTER — Other Ambulatory Visit: Payer: Self-pay

## 2019-01-07 DIAGNOSIS — I999 Unspecified disorder of circulatory system: Secondary | ICD-10-CM | POA: Insufficient documentation

## 2019-01-07 NOTE — Telephone Encounter (Signed)
Called Meghan Schwartz but she was not available again.   Left message that we would be using oxycodone post op

## 2019-01-07 NOTE — Progress Notes (Signed)
ABI completed. Refer to "CV Proc" under chart review to view preliminary results.  01/07/2019 10:05 AM Kelby Aline., MHA, RVT, RDCS, RDMS

## 2019-01-14 NOTE — H&P (Signed)
Meghan Schwartz is an 68 y.o. female.   Chief Complaint:  Painful left knee  HPI: Patient presented March 2020 for a cyst on her left leg. She said that it is located on the lateral side of her knee. She said that it painful, and makes life difficult. It first appeared about 6 months ago and has continued to grow. She saw Dr.Olin about a February 2020  and he tried to aspirate it but was unsuccessful.  Meghan Schwartz has a history of rheumatoid arthritis.  She is taking Enbrel and Plaquenil.  The mass occurred about 6 months ago possibly after an injury continues to "grow".  No numbness or tingling.  An MRI was ordered and results revealed   A 4.0 x 2.7 x 6.7 cm complex, heterogeneous cystic lesion along the lateral knee, contacting the lateral meniscus body. Given underlying suspected lateral meniscus horizontal tear, this could represent a large parameniscal cyst. The differential diagnosis also includes a complex ganglion/synovial cyst, and less likely LCL bursitis.  A Longitudinal tear of the medial meniscus posterior horn and  Possible additional longitudinal tear of the body.  Moderate tricompartmental osteoarthritis.  Moderate joint effusion with prominent synovitis likely related to patient's underlying rheumatoid arthritis. No erosive changes.  In June of 2020 an aspiration of the mass was performed by Dr Durward Fortes and injected with cortisone.  In October she returned and wanted to have surgical intervention That of  arthroscopic debridement of the left knee which would include that of debridement of the medial and meniscal tears, chondroplasty as indicated, and a incision and cyst removal from the lateral knee  Past Medical History:  Diagnosis Date  . Allergic rhinitis   . Anxiety   . Benzodiazepine dependence ([place])   . Bruxism   . CAD (coronary artery disease)    S/p NSTEMI 01/2018 >> LHC: oLAD 20, pLAD 20; oD1 20, oLCx 40, pRCA 99 >> PCI: DES to prox RCA // Echo 01/2018: EF 50-55;  prob inf-lat and inf HK  . Chronic diastolic heart failure    Echocardiogram 07/2018: EF 72-53, grade 1 diastolic dysfunction, normal wall motion, normal GLS (-22.3), PASP 28  . Chronic foot pain    bunions, torn ligaments-on chronic pain medication  . CTS (carpal tunnel syndrome) 06/08/2014  . Depression   . High cholesterol   . Hypertension   . MDD (major depressive disorder) 10/02/2017  . Migraine   . RA (rheumatoid arthritis) ([place])     Past Surgical History:  Procedure Laterality Date  . BTL  2000  . CARPAL TUNNEL RELEASE Left   . [place]  . CORONARY STENT INTERVENTION N/A 01/27/2018   Procedure: CORONARY STENT INTERVENTION;  Surgeon: Burnell Blanks, MD;  Location: [facility];  Service: Cardiovascular;  Laterality: N/A;  . eye implants    . FOOT SURGERY Left 2008  . front teeth removed after injury    . LEFT HEART CATH AND CORONARY ANGIOGRAPHY N/A 01/27/2018   Procedure: LEFT HEART CATH AND CORONARY ANGIOGRAPHY;  Surgeon: Burnell Blanks, MD;  Location: [facility];  Service: Cardiovascular;  Laterality: N/A;  . pre cancerous lesion removed from forehead    . TOTAL ABDOMINAL HYSTERECTOMY W/ BILATERAL SALPINGOOPHORECTOMY  2002   Dr. Willis Modena  . VAGINAL HYSTERECTOMY  2001    Family History  Problem Relation Age of Onset  . Thyroid disease Mother   . Breast cancer Mother   . Parkinson's disease Father   .  Heart attack Paternal Grandfather   . Stroke Paternal Grandmother   . Arthritis Maternal Grandmother   . Heart attack Maternal Grandmother   . Raynaud syndrome Maternal Aunt    Social History:  reports that she has been smoking cigarettes. She has smoked for the past 20.00 years. She has never used smokeless tobacco. She reports that she does not drink alcohol or use drugs.  Allergies:  Allergies  Allergen Reactions  . Erythromycin Nausea Only and Nausea And Vomiting    No medications prior to admission.    No  results found for this or any previous visit (from the past 48 hour(s)). No results found. Review of Systems  Constitutional: Positive for fatigue.  HENT: Negative for ear pain.   Eyes: Negative for pain.  Respiratory: Negative for shortness of breath.   Cardiovascular: Positive for leg swelling.  Gastrointestinal: Positive for constipation.  Endocrine: Negative for cold intolerance and heat intolerance.  Genitourinary: Negative for difficulty urinating.  Musculoskeletal: Positive for joint swelling.  Skin: Negative for rash.  Allergic/Immunologic: Negative for food allergies.  Neurological: Positive for weakness.  Hematological: Does not bruise/bleed easily.  Psychiatric/Behavioral: Positive for sleep disturbance.     There were no vitals taken for this visit. Physical Exam  Constitutional: She is oriented to person, place, and time. She appears well-developed and well-nourished.  HENT:  Head: Normocephalic.  Eyes: Pupils are equal, round, and reactive to light.  Cardiovascular: Normal rate.  Respiratory: Effort normal.  Neurological: She is alert and oriented to person, place, and time. She has normal reflexes.  Skin: Skin is warm.  Psychiatric: She has a normal mood and affect. Her behavior is normal. Judgment and thought content normal.   Musculoskeletal: large cystic lesion along the lateral joint line.  It is fluid-filled.  She can range from 0 degrees to 100 degrees.  Continues to have some patellofemoral crepitation.  No medial joint line pain  Assessment/Plan Assessment: A 4.0 x 2.7 x 6.7 cm complex, heterogeneous cystic lesion along the lateral knee, contacting the lateral meniscus body. Given underlying suspected lateral meniscus horizontal tear, this could represent a large parameniscal cyst. The differential diagnosis also includes a complex ganglion/synovial cyst, and less likely LCL bursitis.  Plan: arthroscopic debridement of the left knee which would include  that of debridement of the medial and meniscal tears, chondroplasty as indicated, and a incision and cyst removal from the lateral knee   Jacqualine Code, PA-C 01/14/2019, 11:59 AM

## 2019-01-20 ENCOUNTER — Other Ambulatory Visit: Payer: Self-pay | Admitting: Physician Assistant

## 2019-01-20 ENCOUNTER — Other Ambulatory Visit: Payer: Self-pay | Admitting: Internal Medicine

## 2019-01-28 ENCOUNTER — Other Ambulatory Visit: Payer: Self-pay | Admitting: Orthopaedic Surgery

## 2019-01-28 ENCOUNTER — Telehealth: Payer: Self-pay

## 2019-01-28 MED ORDER — OXYCODONE HCL 5 MG PO CAPS: 5.0000 mg | ORAL_CAPSULE | Freq: Four times a day (QID) | ORAL | 0 refills | Status: DC | PRN

## 2019-01-28 NOTE — Telephone Encounter (Signed)
See other message

## 2019-01-28 NOTE — Telephone Encounter (Signed)
Called and discussed pain meds post op-will send in oxy IR #30 per Dr Assunta Curtis suggestion. Mrs Noreen will take this for one week and then restart suboxone per Dr Dellis Filbert

## 2019-01-28 NOTE — Telephone Encounter (Signed)
Please call.

## 2019-01-28 NOTE — Telephone Encounter (Signed)
called

## 2019-01-28 NOTE — Telephone Encounter (Signed)
Dr. Deveron [facility]long [facility] would like to speak to Dr. Durward Fortes. Would like treatment plan for patient after SU. Su scheduled for 02/02/2019. Left knee Arthroscopy. She is on Suboxone. Will she need to stop this? Please call her back.   [phone]

## 2019-01-29 ENCOUNTER — Other Ambulatory Visit (HOSPITAL_COMMUNITY)
Admission: RE | Admit: 2019-01-29 | Discharge: 2019-01-29 | Disposition: A | Discharge status: Home / Self Care | Payer: Medicare Other | Source: Ambulatory Visit | Attending: Orthopaedic Surgery | Admitting: Orthopaedic Surgery

## 2019-01-29 DIAGNOSIS — Z20828 Contact with and (suspected) exposure to other viral communicable diseases: Secondary | ICD-10-CM | POA: Insufficient documentation

## 2019-01-29 DIAGNOSIS — Z01812 Encounter for preprocedural laboratory examination: Secondary | ICD-10-CM | POA: Insufficient documentation

## 2019-01-29 NOTE — Patient Instructions (Addendum)
DUE TO COVID-19 ONLY ONE VISITOR IS ALLOWED TO COME WITH YOU AND STAY IN THE WAITING ROOM ONLY DURING PRE OP AND PROCEDURE DAY OF SURGERY. THE 1 VISITOR MAY VISIT WITH YOU AFTER SURGERY IN YOUR PRIVATE ROOM DURING VISITING HOURS ONLY!   YOUR COVID TEST IS COMPLETED, PLEASE BEGIN THE QUARANTINE INSTRUCTIONS AS OUTLINED IN YOUR HANDOUT.                Meghan Schwartz     Your procedure is scheduled on:02/02/2019    Report to [facility] Main  Entrance    Report to admitting at: 8:00 AM     Call this number if you have problems the morning of surgery [phone]    Remember: NO SOLID FOOD AFTER MIDNIGHT THE NIGHT PRIOR TO SURGERY. NOTHING BY MOUTH EXCEPT CLEAR LIQUIDS UNTIL: 7:00 AM . PLEASE FINISH ENSURE DRINK PER SURGEON ORDER  WHICH NEEDS TO BE COMPLETED AT :7:00 AM     CLEAR LIQUID DIET   Foods Allowed                                                                     Foods Excluded  Coffee and tea, regular and decaf                             liquids that you cannot  Plain Jell-O any favor except red or purple                                           see through such as: Fruit ices (not with fruit pulp)                                     milk, soups, orange juice  Iced Popsicles                                    All solid food Carbonated beverages, regular and diet                                    Cranberry, grape and apple juices Sports drinks like Gatorade Lightly seasoned clear broth or consume(fat free) Sugar, honey syrup  Sample Menu Breakfast                                Lunch                                     Supper Cranberry juice                    Beef broth  Chicken broth Jell-O                                     Grape juice                           Apple juice Coffee or tea                        Jell-O                                      Popsicle                                                Coffee or tea                         Coffee or tea  _____________________________________________________________________    BRUSH YOUR TEETH MORNING OF SURGERY AND RINSE YOUR MOUTH OUT, NO CHEWING GUM CANDY OR MINTS.     Take these medicines the morning of surgery with A SIP OF WATER: Clonidine(Catapres),Escitalopram(Lexapro),Allegra,Atarax<Crestor,Metoprolol,Oxycodone.                You may not have any metal on your body including hair pins and              piercings  Do not wear jewelry, make-up, lotions, powders or perfumes, deodorant             Do not wear nail polish on your fingernails.  Do not shave  48 hours prior to surgery.               Do not bring valuables to the hospital. [facility] IS NOT             RESPONSIBLE   FOR VALUABLES.  Contacts, dentures or bridgework may not be worn into surgery.  Leave suitcase in the car. After surgery it may be brought to your room.     Patients discharged the day of surgery will not be allowed to drive home. IF YOU ARE HAVING SURGERY AND GOING HOME THE SAME DAY, YOU MUST HAVE AN ADULT TO DRIVE YOU HOME AND  BE WITH YOU FOR 24 HOURS. YOU MAY GO HOME BY TAXI OR UBER OR ORTHERWISE, BUT AN ADULT MUST ACCOMPANY YOU HOME AND STAY WITH YOU FOR 24 HOURS.  Name and phone number of your driver:  Special Instructions: N/A              Please read over the following fact sheets you were given: _____________________________________________________________________     [facility] - Preparing for Surgery Before surgery, you can play an important role.  Because skin is not sterile, your skin needs to be as free of germs as possible.  You can reduce the number of germs on your skin by washing with CHG (chlorahexidine gluconate) soap before surgery.  CHG is an antiseptic cleaner which kills germs and bonds with the skin to continue killing germs even after washing. Please DO NOT use if you have an allergy to CHG or antibacterial soaps.  If your skin  becomes  reddened/irritated stop using the CHG and inform your nurse when you arrive at Short Stay. Do not shave (including legs and underarms) for at least 48 hours prior to the first CHG shower.  You may shave your face/neck. Please follow these instructions carefully:  1.  Shower with CHG Soap the night before surgery and the  morning of Surgery.  2.  If you choose to wash your hair, wash your hair first as usual with your  normal  shampoo.  3.  After you shampoo, rinse your hair and body thoroughly to remove the  shampoo.                           4.  Use CHG as you would any other liquid soap.  You can apply chg directly  to the skin and wash                       Gently with a scrungie or clean washcloth.  5.  Apply the CHG Soap to your body ONLY FROM THE NECK DOWN.   Do not use on face/ open                           Wound or open sores. Avoid contact with eyes, ears mouth and genitals (private parts).                       Wash face,  Genitals (private parts) with your normal soap.             6.  Wash thoroughly, paying special attention to the area where your surgery  will be performed.  7.  Thoroughly rinse your body with warm water from the neck down.  8.  DO NOT shower/wash with your normal soap after using and rinsing off  the CHG Soap.                9.  Pat yourself dry with a clean towel.            10.  Wear clean pajamas.            11.  Place clean sheets on your bed the night of your first shower and do not  sleep with pets. Day of Surgery : Do not apply any lotions/deodorants the morning of surgery.  Please wear clean clothes to the hospital/surgery center.  FAILURE TO FOLLOW THESE INSTRUCTIONS MAY RESULT IN THE CANCELLATION OF YOUR SURGERY PATIENT SIGNATURE_________________________________  NURSE SIGNATURE__________________________________  ________________________________________________________________________       [name]  An incentive spirometer is a tool  that can help keep your lungs clear and active. This tool measures how well you are filling your lungs with each breath. Taking long deep breaths may help reverse or decrease the chance of developing breathing (pulmonary) problems (especially infection) following:  A long period of time when you are unable to move or be active. BEFORE THE PROCEDURE   If the spirometer includes an indicator to show your best effort, your nurse or respiratory therapist will set it to a desired goal.  If possible, sit up straight or lean slightly forward. Try not to slouch.  Hold the incentive spirometer in an upright position. INSTRUCTIONS FOR USE  1. Sit on the edge of your bed if possible, or sit up as far as you can in bed or  on a chair. 2. Hold the incentive spirometer in an upright position. 3. Breathe out normally. 4. Place the mouthpiece in your mouth and seal your lips tightly around it. 5. Breathe in slowly and as deeply as possible, raising the piston or the ball toward the top of the column. 6. Hold your breath for 3-5 seconds or for as long as possible. Allow the piston or ball to fall to the bottom of the column. 7. Remove the mouthpiece from your mouth and breathe out normally. 8. Rest for a few seconds and repeat Steps 1 through 7 at least 10 times every 1-2 hours when you are awake. Take your time and take a few normal breaths between deep breaths. 9. The spirometer may include an indicator to show your best effort. Use the indicator as a goal to work toward during each repetition. 10. After each set of 10 deep breaths, practice coughing to be sure your lungs are clear. If you have an incision (the cut made at the time of surgery), support your incision when coughing by placing a pillow or rolled up towels firmly against it. Once you are able to get out of bed, walk around indoors and cough well. You may stop using the incentive spirometer when instructed by your caregiver.  RISKS AND  COMPLICATIONS  Take your time so you do not get dizzy or light-headed.  If you are in pain, you may need to take or ask for pain medication before doing incentive spirometry. It is harder to take a deep breath if you are having pain. AFTER USE  Rest and breathe slowly and easily.  It can be helpful to keep track of a log of your progress. Your caregiver can provide you with a simple table to help with this. If you are using the spirometer at home, follow these instructions: SEEK MEDICAL CARE IF:   You are having difficultly using the spirometer.  You have trouble using the spirometer as often as instructed.  Your pain medication is not giving enough relief while using the spirometer.  You develop fever of 100.5 F (38.1 C) or higher. SEEK IMMEDIATE MEDICAL CARE IF:   You cough up bloody sputum that had not been present before.  You develop fever of 102 F (38.9 C) or greater.  You develop worsening pain at or near the incision site. MAKE SURE YOU:   Understand these instructions.  Will watch your condition.  Will get help right away if you are not doing well or get worse. Document Released: 06/24/2006 Document Revised: 05/06/2011 Document Reviewed: 08/25/2006 [facility] Patient Information 2014 [employer], [place].   WHAT IS A BLOOD TRANSFUSION? Blood Transfusion Information  A transfusion is the replacement of blood or some of its parts. Blood is made up of multiple cells which provide different functions.  Red blood cells carry oxygen and are used for blood loss replacement.  White blood cells fight against infection.  Platelets control bleeding.  Plasma helps clot blood.  Other blood products are available for specialized needs, such as hemophilia or other clotting disorders. BEFORE THE TRANSFUSION  Who gives blood for transfusions?   Healthy volunteers who are fully evaluated to make sure their blood is safe. This is blood bank blood. Transfusion therapy is the  safest it has ever been in the practice of medicine. Before blood is taken from a donor, a complete history is taken to make sure that person has no history of diseases nor engages in risky social behavior (examples  are intravenous drug use or sexual activity with multiple partners). The donor's travel history is screened to minimize risk of transmitting infections, such as malaria. The donated blood is tested for signs of infectious diseases, such as HIV and hepatitis. The blood is then tested to be sure it is compatible with you in order to minimize the chance of a transfusion reaction. If you or a relative donates blood, this is often done in anticipation of surgery and is not appropriate for emergency situations. It takes many days to process the donated blood. RISKS AND COMPLICATIONS Although transfusion therapy is very safe and saves many lives, the main dangers of transfusion include:   Getting an infectious disease.  Developing a transfusion reaction. This is an allergic reaction to something in the blood you were given. Every precaution is taken to prevent this. The decision to have a blood transfusion has been considered carefully by your caregiver before blood is given. Blood is not given unless the benefits outweigh the risks. AFTER THE TRANSFUSION  Right after receiving a blood transfusion, you will usually feel much better and more energetic. This is especially true if your red blood cells have gotten low (anemic). The transfusion raises the level of the red blood cells which carry oxygen, and this usually causes an energy increase.  The nurse administering the transfusion will monitor you carefully for complications. HOME CARE INSTRUCTIONS  No special instructions are needed after a transfusion. You may find your energy is better. Speak with your caregiver about any limitations on activity for underlying diseases you may have. SEEK MEDICAL CARE IF:   Your condition is not improving  after your transfusion.  You develop redness or irritation at the intravenous (IV) site. SEEK IMMEDIATE MEDICAL CARE IF:  Any of the following symptoms occur over the next 12 hours:  Shaking chills.  You have a temperature by mouth above 102 F (38.9 C), not controlled by medicine.  Chest, back, or muscle pain.  People around you feel you are not acting correctly or are confused.  Shortness of breath or difficulty breathing.  Dizziness and fainting.  You get a rash or develop hives.  You have a decrease in urine output.  Your urine turns a dark color or changes to pink, red, or brown. Any of the following symptoms occur over the next 10 days:  You have a temperature by mouth above 102 F (38.9 C), not controlled by medicine.  Shortness of breath.  Weakness after normal activity.  The white part of the eye turns yellow (jaundice).  You have a decrease in the amount of urine or are urinating less often.  Your urine turns a dark color or changes to pink, red, or brown. Document Released: 02/09/2000 Document Revised: 05/06/2011 Document Reviewed: 09/28/2007 [facility] Patient Information 2014 [employer], [place].  _______________________________________________________________________ ________________________________________________________________________

## 2019-01-30 LAB — NOVEL CORONAVIRUS, NAA (HOSP ORDER, SEND-OUT TO REF LAB; TAT 18-24 HRS): SARS-CoV-2, NAA: NOT DETECTED

## 2019-02-01 ENCOUNTER — Encounter (HOSPITAL_COMMUNITY)
Admission: RE | Admit: 2019-02-01 | Discharge: 2019-02-01 | Disposition: A | Discharge status: Home / Self Care | Payer: Medicare Other | Source: Ambulatory Visit | Attending: Orthopaedic Surgery | Admitting: Orthopaedic Surgery

## 2019-02-01 ENCOUNTER — Encounter (HOSPITAL_COMMUNITY): Payer: Self-pay

## 2019-02-01 ENCOUNTER — Other Ambulatory Visit: Payer: Self-pay

## 2019-02-01 DIAGNOSIS — Z7982 Long term (current) use of aspirin: Secondary | ICD-10-CM | POA: Diagnosis not present

## 2019-02-01 DIAGNOSIS — Z01812 Encounter for preprocedural laboratory examination: Secondary | ICD-10-CM | POA: Insufficient documentation

## 2019-02-01 DIAGNOSIS — M069 Rheumatoid arthritis, unspecified: Secondary | ICD-10-CM | POA: Diagnosis not present

## 2019-02-01 DIAGNOSIS — E7800 Pure hypercholesterolemia, unspecified: Secondary | ICD-10-CM | POA: Insufficient documentation

## 2019-02-01 DIAGNOSIS — S83282A Other tear of lateral meniscus, current injury, left knee, initial encounter: Secondary | ICD-10-CM | POA: Diagnosis not present

## 2019-02-01 DIAGNOSIS — I252 Old myocardial infarction: Secondary | ICD-10-CM | POA: Insufficient documentation

## 2019-02-01 DIAGNOSIS — F17200 Nicotine dependence, unspecified, uncomplicated: Secondary | ICD-10-CM | POA: Diagnosis not present

## 2019-02-01 DIAGNOSIS — S83242A Other tear of medial meniscus, current injury, left knee, initial encounter: Secondary | ICD-10-CM | POA: Diagnosis not present

## 2019-02-01 DIAGNOSIS — I2510 Atherosclerotic heart disease of native coronary artery without angina pectoris: Secondary | ICD-10-CM | POA: Insufficient documentation

## 2019-02-01 DIAGNOSIS — Z955 Presence of coronary angioplasty implant and graft: Secondary | ICD-10-CM | POA: Insufficient documentation

## 2019-02-01 DIAGNOSIS — F419 Anxiety disorder, unspecified: Secondary | ICD-10-CM | POA: Diagnosis not present

## 2019-02-01 DIAGNOSIS — F1321 Sedative, hypnotic or anxiolytic dependence, in remission: Secondary | ICD-10-CM | POA: Insufficient documentation

## 2019-02-01 DIAGNOSIS — Z79899 Other long term (current) drug therapy: Secondary | ICD-10-CM | POA: Diagnosis not present

## 2019-02-01 DIAGNOSIS — F329 Major depressive disorder, single episode, unspecified: Secondary | ICD-10-CM | POA: Diagnosis not present

## 2019-02-01 DIAGNOSIS — I5032 Chronic diastolic (congestive) heart failure: Secondary | ICD-10-CM | POA: Diagnosis not present

## 2019-02-01 DIAGNOSIS — Z7902 Long term (current) use of antithrombotics/antiplatelets: Secondary | ICD-10-CM | POA: Diagnosis not present

## 2019-02-01 DIAGNOSIS — I110 Hypertensive heart disease with heart failure: Secondary | ICD-10-CM | POA: Insufficient documentation

## 2019-02-01 DIAGNOSIS — Z90722 Acquired absence of ovaries, bilateral: Secondary | ICD-10-CM | POA: Diagnosis not present

## 2019-02-01 LAB — BASIC METABOLIC PANEL
Anion gap: 7 (ref 5–15)
BUN: 16 mg/dL (ref 8–23)
CO2: 29 mmol/L (ref 22–32)
Calcium: 9 mg/dL (ref 8.9–10.3)
Chloride: 103 mmol/L (ref 98–111)
Creatinine, Ser: 0.63 mg/dL (ref 0.44–1.00)
GFR calc Af Amer: >60 mL/min (ref >60)
GFR calc non Af Amer: >60 mL/min (ref >60)
Glucose, Bld: 90 mg/dL (ref 70–99)
Potassium: 4.3 mmol/L (ref 3.5–5.1)
Sodium: 139 mmol/L (ref 135–145)

## 2019-02-01 LAB — CBC
HCT: 39.3 % (ref 36.0–46.0)
Hemoglobin: 12.1 g/dL (ref 12.0–15.0)
MCH: 26.1 pg (ref 26.0–34.0)
MCHC: 30.8 g/dL (ref 30.0–36.0)
MCV: 84.9 fL (ref 80.0–100.0)
Platelets: 202 10*3/uL (ref 150–400)
RBC: 4.63 MIL/uL (ref 3.87–5.11)
RDW: 14 % (ref 11.5–15.5)
WBC: 3.5 10*3/uL — ABNORMAL LOW (ref 4.0–10.5)
nRBC: 0 % (ref 0.0–0.2)

## 2019-02-01 NOTE — Anesthesia Preprocedure Evaluation (Addendum)
Anesthesia Evaluation  Patient identified by MRN, date of birth, ID band Patient awake    Reviewed: Allergy & Precautions, NPO status , Patient's Chart, lab work & pertinent test results  Airway Mallampati: I  TM Distance: >3 FB Neck ROM: Full    Dental  (+) Dental Advisory Given, Upper Dentures   Pulmonary Current Smoker,    Pulmonary exam normal        Cardiovascular hypertension, + CAD, + Past MI, + Cardiac Stents and +CHF   Rhythm:Regular Rate:Normal     Neuro/Psych  Headaches, PSYCHIATRIC DISORDERS Anxiety Depression  Neuromuscular disease    GI/Hepatic negative GI ROS, Neg liver ROS,   Endo/Other  negative endocrine ROS  Renal/GU negative Renal ROS     Musculoskeletal  (+) Arthritis , Osteoarthritis,    Abdominal Normal abdominal exam  (+)   Peds  Hematology negative hematology ROS (+)   Anesthesia Other Findings   Reproductive/Obstetrics                            Echo: 1. The left ventricle has normal systolic function, with an ejection fraction of 55-60%. The cavity size was normal. Left ventricular diastolic Doppler parameters are consistent with impaired relaxation. No evidence of left ventricular regional wall  motion abnormalities. GLS -22.3%, nromal.  2. The right ventricle has normal systolic function. The cavity was normal. There is no increase in right ventricular wall thickness.  3. No evidence of mitral valve stenosis. No significant mitral regurgitation.  4. The aortic valve is tricuspid. No stenosis of the aortic valve.  5. The aortic root is normal in size and structure.  6. Normal IVC size. PA systolic pressure 28 mmHg.  Anesthesia Physical Anesthesia Plan  ASA: III  Anesthesia Plan: General   Post-op Pain Management:    Induction: Intravenous  PONV Risk Score and Plan: 3 and Ondansetron, Treatment may vary due to age or medical condition and  Dexamethasone  Airway Management Planned: LMA  Additional Equipment: None  Intra-op Plan:   Post-operative Plan: Extubation in OR  Informed Consent: I have reviewed the patients History and Physical, chart, labs and discussed the procedure including the risks, benefits and alternatives for the proposed anesthesia with the patient or authorized representative who has indicated his/her understanding and acceptance.     Dental advisory given  Plan Discussed with: CRNA  Anesthesia Plan Comments: (See PAT note 02/01/2019, Konrad Felix, PA-C)       Anesthesia Quick Evaluation

## 2019-02-01 NOTE — Progress Notes (Signed)
PCP - Maurice Small, MD Cardiologist - Fay Records, MD,lov 12-09-2018 epic   Chest x-ray -  EKG - 09-15-2018 Stress Test -  ECHO - 08-20-2018 epic  Cardiac Cath - 01-27-18 epic   Sleep Study -  CPAP -   Fasting Blood Sugar -  Checks Blood Sugar _____ times a day  Blood Thinner Instructions: PLAVIX : continue per cardiac clearance note  Aspirin Instructions: continue per cardiac clearance note  Last Dose:  Anesthesia review:  Hx of htn , cad, CHF, MI 01-2018 and cardiac cath with stent intervention . Managed on PLAVIX for anticoag. Today reports no acute cardiac symptoms. Cardiac clearance - see epic note dated 12-25-2018 [name] , NP/  PA to review chart   Patient denies shortness of breath, fever, cough and chest pain at PAT appointment   Patient verbalized understanding of instructions that were given to them at the PAT appointment. Patient was also instructed that they will need to review over the PAT instructions again at home before surgery.

## 2019-02-01 NOTE — Progress Notes (Signed)
Anesthesia Chart Review   Case: [id number] Date/Time: 02/02/19 0952   Procedure: LEFT KNEE ARTHROSCOPY, MEDIAL AND LATERAL MENISECTOMY, EXCISION OF LEFT LATERAL MENISCAL CYST (Left Knee)   Anesthesia type: Choice   Pre-op diagnosis: torn medial and lateral meniscus left knee   Location: [facility] ROOM 04 / [facility]   Surgeon: Garald Balding, MD      DISCUSSION:68 y.o. current every day smoker with h/o HTN, chronic diastolic heart failure, CAD, MI 01/2018, torn medial and lateral meniscus left knee scheduled for above procedure 02/02/2019 with Dr. Joni Fears.   Pt cleared by cardiology 12/30/2018.  Per [name], NP, "Given past medical history and time since last visit, based on ACC/AHA guidelines, Meghan Schwartz would be at acceptable risk for the planned procedure without further cardiovascular testing.   She was last seen on 12/01/2018 for pre-operative clearance for upcoming procedure. She will not be able to stop Plavix until early December 2020 however per surgical clearance form, no medications will need to be held in the pre-operative or intra-operative period therefore based on her clearance evaluation, she is at acceptable risk to proceed without further cardiac testing. Her risk of perioperative major cardiac event is high at 6.6% according to the Revised Cardia Risk Index. Her functional level is good at 4.06 METs according to the Duke Activity Status Index."  Anticipate pt can proceed with planned procedure barring acute status change.   VS: BP 106/64   Pulse 62   Temp 37.1 C (Oral)   Resp 20   PROVIDERS: Maurice Small, MD is PCP   Dorris Carnes, MD is Cardiologist  LABS: Labs reviewed: Acceptable for surgery. (all labs ordered are listed, but only abnormal results are displayed)  Labs Reviewed  CBC - Abnormal; Notable for the following components:      Result Value   WBC 3.5 (*)    All other components within normal limits  BASIC METABOLIC PANEL      IMAGES:   EKG: 09/15/2018 Rate 61 bpm Normal sinus rhythm  Low voltage QRS ST abnormality, possible digitalis effect Prolonged QT   CV: Echo 08/20/2018 IMPRESSIONS   1. The left ventricle has normal systolic function, with an ejection fraction of 55-60%. The cavity size was normal. Left ventricular diastolic Doppler parameters are consistent with impaired relaxation. No evidence of left ventricular regional wall  motion abnormalities. GLS -22.3%, nromal.  2. The right ventricle has normal systolic function. The cavity was normal. There is no increase in right ventricular wall thickness.  3. No evidence of mitral valve stenosis. No significant mitral regurgitation.  4. The aortic valve is tricuspid. No stenosis of the aortic valve.  5. The aortic root is normal in size and structure.  6. Normal IVC size. PA systolic pressure 28 mmHg.  Cardiac Cath 01/27/2018   Prox RCA to Mid RCA lesion is 99% stenosed.  Ost LAD to Prox LAD lesion is 20% stenosed.  Prox LAD lesion is 20% stenosed.  Ost Cx to Prox Cx lesion is 40% stenosed.  Ost 1st Diag lesion is 20% stenosed.  A drug-eluting stent was successfully placed using a STENT SYNERGY DES 3.5X28.  Post intervention, there is a 0% residual stenosis.   1. Severe, ulcerated stenosis mid RCA 2. Mild non-obstructive disease in the LAD and Circumflex 3. Successful PTCA/DES x mid RCA  Recommendations: DAPT with ASA and Brilinta for one year. Continue statin. Will start a beta blocker. Smoking cessation. Likely d/c home in the am if stable.  Past Medical History:  Diagnosis Date  . Allergic rhinitis   . Anxiety   . Benzodiazepine dependence (HCC)   . Bruxism   . CAD (coronary artery disease)    S/p NSTEMI 01/2018 >> LHC: oLAD 20, pLAD 20; oD1 20, oLCx 40, pRCA 99 >> PCI: DES to prox RCA // Echo 01/2018: EF 50-55; prob inf-lat and inf HK  . Chronic diastolic heart failure    Echocardiogram 07/2018: EF 55-60, grade 1  diastolic dysfunction, normal wall motion, normal GLS (-22.3), PASP 28  . Chronic foot pain    bunions, torn ligaments-on chronic pain medication  . CTS (carpal tunnel syndrome) 06/08/2014  . Depression   . High cholesterol   . Hypertension   . MDD (major depressive disorder) 10/02/2017  . Migraine   . Myocardial infarction (HCC) 02/01/2018  . Pneumonia 1986   left lung  . RA (rheumatoid arthritis) (HCC)     Past Surgical History:  Procedure Laterality Date  . BTL  2000  . CARPAL TUNNEL RELEASE Left   . CESAREAN SECTION  1978. 1983  . CORONARY STENT INTERVENTION N/A 01/27/2018   Procedure: CORONARY STENT INTERVENTION;  Surgeon: Kathleene Hazel, MD;  [facility];  Service: Cardiovascular;  Laterality: N/A;  . eye implants    . FOOT SURGERY Left 2008  . front teeth removed after injury    . LEFT HEART CATH AND CORONARY ANGIOGRAPHY N/A 01/27/2018   Procedure: LEFT HEART CATH AND CORONARY ANGIOGRAPHY;  Surgeon: Kathleene Hazel, MD;  [facility];  Service: Cardiovascular;  Laterality: N/A;  . pre cancerous lesion removed from forehead    . TOTAL ABDOMINAL HYSTERECTOMY W/ BILATERAL SALPINGOOPHORECTOMY  2002   Dr. Jackelyn Knife  . VAGINAL HYSTERECTOMY  2001    MEDICATIONS: . aspirin 81 MG chewable tablet  . cholecalciferol (VITAMIN D3) 25 MCG (1000 UT) tablet  . clonazePAM (KLONOPIN) 1 MG tablet  . cloNIDine (CATAPRES) 0.1 MG tablet  . clopidogrel (PLAVIX) 75 MG tablet  . Cranberry 500 MG TABS  . doxycycline (VIBRAMYCIN) 100 MG capsule  . escitalopram (LEXAPRO) 10 MG tablet  . estradiol (VIVELLE-DOT) 0.075 MG/24HR  . etanercept (ENBREL) 50 MG/ML injection  . fexofenadine (ALLEGRA) 180 MG tablet  . furosemide (LASIX) 40 MG tablet  . hydroxychloroquine (PLAQUENIL) 200 MG tablet  . hydrOXYzine (ATARAX/VISTARIL) 25 MG tablet  . metoprolol tartrate (LOPRESSOR) 25 MG tablet  . Multiple Vitamins-Minerals (MULTIVITAMIN WITH MINERALS) tablet   . nitroGLYCERIN (NITROSTAT) 0.4 MG SL tablet  . oxycodone (OXY-IR) 5 MG capsule  . potassium chloride SA (KLOR-CON) 20 MEQ tablet  . rosuvastatin (CRESTOR) 20 MG tablet  . ZUBSOLV 5.7-1.4 MG SUBL   No current facility-administered medications for this encounter.      Meghan Schwartz [facility] [phone] 02/01/19  3:10 PM

## 2019-02-02 ENCOUNTER — Ambulatory Visit (HOSPITAL_COMMUNITY): Payer: Medicare Other | Admitting: Certified Registered Nurse Anesthetist

## 2019-02-02 ENCOUNTER — Encounter (HOSPITAL_COMMUNITY)
Admission: RE | Disposition: A | Discharge status: Home / Self Care | Payer: Self-pay | Source: Home / Self Care | Attending: Orthopaedic Surgery

## 2019-02-02 ENCOUNTER — Ambulatory Visit (HOSPITAL_COMMUNITY): Payer: Medicare Other | Admitting: Physician Assistant

## 2019-02-02 ENCOUNTER — Encounter (HOSPITAL_COMMUNITY): Payer: Self-pay | Admitting: *Deleted

## 2019-02-02 ENCOUNTER — Ambulatory Visit (HOSPITAL_COMMUNITY)
Admission: RE | Admit: 2019-02-02 | Discharge: 2019-02-02 | Disposition: A | Discharge status: Home / Self Care | Payer: Medicare Other | Attending: Orthopaedic Surgery | Admitting: Orthopaedic Surgery

## 2019-02-02 DIAGNOSIS — M23062 Cystic meniscus, other lateral meniscus, left knee: Secondary | ICD-10-CM

## 2019-02-02 DIAGNOSIS — I252 Old myocardial infarction: Secondary | ICD-10-CM | POA: Diagnosis not present

## 2019-02-02 DIAGNOSIS — M23232 Derangement of other medial meniscus due to old tear or injury, left knee: Secondary | ICD-10-CM

## 2019-02-02 DIAGNOSIS — Z955 Presence of coronary angioplasty implant and graft: Secondary | ICD-10-CM | POA: Insufficient documentation

## 2019-02-02 DIAGNOSIS — M23001 Cystic meniscus, unspecified lateral meniscus, left knee: Secondary | ICD-10-CM | POA: Insufficient documentation

## 2019-02-02 DIAGNOSIS — M069 Rheumatoid arthritis, unspecified: Secondary | ICD-10-CM | POA: Insufficient documentation

## 2019-02-02 DIAGNOSIS — Z881 Allergy status to other antibiotic agents status: Secondary | ICD-10-CM | POA: Insufficient documentation

## 2019-02-02 DIAGNOSIS — I110 Hypertensive heart disease with heart failure: Secondary | ICD-10-CM | POA: Insufficient documentation

## 2019-02-02 DIAGNOSIS — F17210 Nicotine dependence, cigarettes, uncomplicated: Secondary | ICD-10-CM | POA: Insufficient documentation

## 2019-02-02 DIAGNOSIS — M23009 Cystic meniscus, unspecified meniscus, unspecified knee: Secondary | ICD-10-CM

## 2019-02-02 DIAGNOSIS — M179 Osteoarthritis of knee, unspecified: Secondary | ICD-10-CM | POA: Diagnosis present

## 2019-02-02 DIAGNOSIS — M2242 Chondromalacia patellae, left knee: Secondary | ICD-10-CM | POA: Diagnosis not present

## 2019-02-02 DIAGNOSIS — M1712 Unilateral primary osteoarthritis, left knee: Secondary | ICD-10-CM | POA: Insufficient documentation

## 2019-02-02 DIAGNOSIS — M25462 Effusion, left knee: Secondary | ICD-10-CM | POA: Insufficient documentation

## 2019-02-02 DIAGNOSIS — M23329 Other meniscus derangements, posterior horn of medial meniscus, unspecified knee: Secondary | ICD-10-CM | POA: Diagnosis present

## 2019-02-02 DIAGNOSIS — M23262 Derangement of other lateral meniscus due to old tear or injury, left knee: Secondary | ICD-10-CM

## 2019-02-02 DIAGNOSIS — I2510 Atherosclerotic heart disease of native coronary artery without angina pectoris: Secondary | ICD-10-CM | POA: Diagnosis not present

## 2019-02-02 DIAGNOSIS — M65862 Other synovitis and tenosynovitis, left lower leg: Secondary | ICD-10-CM | POA: Diagnosis not present

## 2019-02-02 DIAGNOSIS — I5032 Chronic diastolic (congestive) heart failure: Secondary | ICD-10-CM | POA: Insufficient documentation

## 2019-02-02 DIAGNOSIS — M23242 Derangement of anterior horn of lateral meniscus due to old tear or injury, left knee: Secondary | ICD-10-CM | POA: Diagnosis present

## 2019-02-02 DIAGNOSIS — Z79899 Other long term (current) drug therapy: Secondary | ICD-10-CM | POA: Diagnosis not present

## 2019-02-02 DIAGNOSIS — M23359 Other meniscus derangements, posterior horn of lateral meniscus, unspecified knee: Secondary | ICD-10-CM

## 2019-02-02 HISTORY — PX: KNEE ARTHROSCOPY WITH LATERAL MENISECTOMY: SHX6193

## 2019-02-02 SURGERY — ARTHROSCOPY, KNEE, WITH LATERAL MENISCECTOMY
Anesthesia: General | Site: Knee | Laterality: Left

## 2019-02-02 MED ORDER — LIDOCAINE-EPINEPHRINE 1 %-1:100000 IJ SOLN
INTRAMUSCULAR | Status: AC
  Filled 2019-02-02: qty 2

## 2019-02-02 MED ORDER — CEFAZOLIN SODIUM-DEXTROSE 2-3 GM-%(50ML) IV SOLR
INTRAVENOUS | Status: DC | PRN
  Administered 2019-02-02: 2 g via INTRAVENOUS

## 2019-02-02 MED ORDER — HYDROMORPHONE HCL 1 MG/ML IJ SOLN
0.2500 mg | INTRAMUSCULAR | Status: DC | PRN
  Administered 2019-02-02 (×3): 0.5 mg via INTRAVENOUS

## 2019-02-02 MED ORDER — FENTANYL CITRATE (PF) 100 MCG/2ML IJ SOLN
INTRAMUSCULAR | Status: AC
  Filled 2019-02-02: qty 2

## 2019-02-02 MED ORDER — HYDROMORPHONE HCL 1 MG/ML IJ SOLN
INTRAMUSCULAR | Status: AC
  Filled 2019-02-02: qty 1

## 2019-02-02 MED ORDER — LACTATED RINGERS IV SOLN: INTRAVENOUS | Status: DC

## 2019-02-02 MED ORDER — LABETALOL HCL 5 MG/ML IV SOLN
INTRAVENOUS | Status: DC | PRN
  Administered 2019-02-02: 10 mg via INTRAVENOUS

## 2019-02-02 MED ORDER — MEPERIDINE HCL 50 MG/ML IJ SOLN: 6.2500 mg | INTRAMUSCULAR | Status: DC | PRN

## 2019-02-02 MED ORDER — MIDAZOLAM HCL 2 MG/2ML IJ SOLN
INTRAMUSCULAR | Status: AC
  Filled 2019-02-02: qty 2

## 2019-02-02 MED ORDER — PROPOFOL 10 MG/ML IV BOLUS
INTRAVENOUS | Status: AC
  Filled 2019-02-02: qty 20

## 2019-02-02 MED ORDER — PROMETHAZINE HCL 25 MG/ML IJ SOLN: 6.2500 mg | INTRAMUSCULAR | Status: DC | PRN

## 2019-02-02 MED ORDER — FENTANYL CITRATE (PF) 100 MCG/2ML IJ SOLN
INTRAMUSCULAR | Status: DC | PRN
  Administered 2019-02-02: 25 ug via INTRAVENOUS
  Administered 2019-02-02 (×3): 50 ug via INTRAVENOUS
  Administered 2019-02-02: 25 ug via INTRAVENOUS

## 2019-02-02 MED ORDER — ONDANSETRON HCL 4 MG/2ML IJ SOLN
INTRAMUSCULAR | Status: DC | PRN
  Administered 2019-02-02: 4 mg via INTRAVENOUS

## 2019-02-02 MED ORDER — LIDOCAINE 2% (20 MG/ML) 5 ML SYRINGE
INTRAMUSCULAR | Status: AC
  Filled 2019-02-02: qty 5

## 2019-02-02 MED ORDER — MIDAZOLAM HCL 2 MG/2ML IJ SOLN
INTRAMUSCULAR | Status: DC | PRN
  Administered 2019-02-02: 2 mg via INTRAVENOUS

## 2019-02-02 MED ORDER — PROPOFOL 500 MG/50ML IV EMUL
INTRAVENOUS | Status: DC | PRN
  Administered 2019-02-02: 10 mg via INTRAVENOUS
  Administered 2019-02-02: 130 mg via INTRAVENOUS
  Administered 2019-02-02 (×2): 20 mg via INTRAVENOUS

## 2019-02-02 MED ORDER — LACTATED RINGERS IR SOLN
Status: DC | PRN
  Administered 2019-02-02: 1

## 2019-02-02 MED ORDER — ONDANSETRON HCL 4 MG/2ML IJ SOLN
INTRAMUSCULAR | Status: AC
  Filled 2019-02-02: qty 2

## 2019-02-02 MED ORDER — CHLORHEXIDINE GLUCONATE 4 % EX LIQD: 60.0000 mL | Freq: Once | CUTANEOUS | Status: DC

## 2019-02-02 MED ORDER — BUPIVACAINE HCL (PF) 0.5 % IJ SOLN
INTRAMUSCULAR | Status: AC
  Filled 2019-02-02: qty 30

## 2019-02-02 MED ORDER — DEXAMETHASONE SODIUM PHOSPHATE 10 MG/ML IJ SOLN
INTRAMUSCULAR | Status: AC
  Filled 2019-02-02: qty 1

## 2019-02-02 MED ORDER — LIDOCAINE-EPINEPHRINE (PF) 1 %-1:200000 IJ SOLN
INTRAMUSCULAR | Status: DC | PRN
  Administered 2019-02-02: 14 mL
  Administered 2019-02-02: 5 mL

## 2019-02-02 MED ORDER — ACETAMINOPHEN 160 MG/5ML PO SOLN: 325.0000 mg | Freq: Once | ORAL | Status: DC | PRN

## 2019-02-02 MED ORDER — LACTATED RINGERS IV SOLN
INTRAVENOUS | Status: DC
  Administered 2019-02-02: 09:00:00 via INTRAVENOUS

## 2019-02-02 MED ORDER — DEXAMETHASONE SODIUM PHOSPHATE 10 MG/ML IJ SOLN
INTRAMUSCULAR | Status: DC | PRN
  Administered 2019-02-02: 8 mg via INTRAVENOUS

## 2019-02-02 MED ORDER — CEFAZOLIN SODIUM-DEXTROSE 2-4 GM/100ML-% IV SOLN
INTRAVENOUS | Status: AC
  Filled 2019-02-02: qty 100

## 2019-02-02 MED ORDER — SODIUM CHLORIDE 0.9 % IV SOLN: INTRAVENOUS | Status: DC

## 2019-02-02 MED ORDER — LIDOCAINE 2% (20 MG/ML) 5 ML SYRINGE
INTRAMUSCULAR | Status: DC | PRN
  Administered 2019-02-02: 60 mg via INTRAVENOUS

## 2019-02-02 MED ORDER — OXYCODONE HCL 5 MG/5ML PO SOLN: 5.0000 mg | Freq: Once | ORAL | Status: DC | PRN

## 2019-02-02 MED ORDER — FENTANYL CITRATE (PF) 100 MCG/2ML IJ SOLN: 50.0000 ug | INTRAMUSCULAR | Status: DC

## 2019-02-02 MED ORDER — ACETAMINOPHEN 10 MG/ML IV SOLN: 1000.0000 mg | Freq: Once | INTRAVENOUS | Status: DC | PRN

## 2019-02-02 MED ORDER — BUPIVACAINE HCL (PF) 0.25 % IJ SOLN
INTRAMUSCULAR | Status: AC
  Filled 2019-02-02: qty 30

## 2019-02-02 MED ORDER — OXYCODONE HCL 5 MG PO TABS: 5.0000 mg | ORAL_TABLET | Freq: Once | ORAL | Status: DC | PRN

## 2019-02-02 MED ORDER — ACETAMINOPHEN 325 MG PO TABS: 325.0000 mg | ORAL_TABLET | Freq: Once | ORAL | Status: DC | PRN

## 2019-02-02 MED ORDER — MIDAZOLAM HCL 2 MG/2ML IJ SOLN: 1.0000 mg | INTRAMUSCULAR | Status: DC

## 2019-02-02 SURGICAL SUPPLY — 30 items
BLADE EXCALIBUR 4.0X13 (MISCELLANEOUS) IMPLANT
BNDG ELASTIC 6X5.8 VLCR STR LF (GAUZE/BANDAGES/DRESSINGS) ×2 IMPLANT
BNDG GAUZE ELAST 4 BULKY (GAUZE/BANDAGES/DRESSINGS) ×2 IMPLANT
COVER WAND RF STERILE (DRAPES) IMPLANT
DISSECTOR  3.8MM X 13CM (MISCELLANEOUS)
DISSECTOR 3.8MM X 13CM (MISCELLANEOUS) IMPLANT
DRAPE ARTHROSCOPY W/POUCH 114 (DRAPES) ×2 IMPLANT
DRSG ADAPTIC 3X8 NADH LF (GAUZE/BANDAGES/DRESSINGS) ×1 IMPLANT
DRSG EMULSION OIL 3X3 NADH (GAUZE/BANDAGES/DRESSINGS) ×2 IMPLANT
DURAPREP 26ML APPLICATOR (WOUND CARE) ×2 IMPLANT
ELECT MENISCUS 165MM 90D (ELECTRODE) IMPLANT
ELECT REM PT RETURN 15FT ADLT (MISCELLANEOUS) IMPLANT
GLOVE BIOGEL PI IND STRL 8.5 (GLOVE) IMPLANT
GLOVE BIOGEL PI INDICATOR 8.5 (GLOVE) ×1
GLOVE ECLIPSE 8.0 STRL XLNG CF (GLOVE) ×2 IMPLANT
GLOVE SURG ORTHO 8.0 STRL STRW (GLOVE) ×1 IMPLANT
GOWN STRL REUS W/ TWL LRG LVL3 (GOWN DISPOSABLE) ×1 IMPLANT
GOWN STRL REUS W/TWL 2XL LVL3 (GOWN DISPOSABLE) ×2 IMPLANT
GOWN STRL REUS W/TWL LRG LVL3 (GOWN DISPOSABLE) ×2
MANIFOLD NEPTUNE II (INSTRUMENTS) IMPLANT
PACK ARTHROSCOPY DSU (CUSTOM PROCEDURE TRAY) ×2 IMPLANT
PAD MASON LEG HOLDER (PIN) ×2 IMPLANT
PENCIL SMOKE EVACUATOR (MISCELLANEOUS) IMPLANT
PORT APPOLLO RF 90DEGREE MULTI (SURGICAL WAND) ×2 IMPLANT
SUT ETHILON 4 0 PS 2 18 (SUTURE) IMPLANT
SUT MNCRL AB 3-0 PS2 18 (SUTURE) ×1 IMPLANT
TOWEL OR 17X26 10 PK STRL BLUE (TOWEL DISPOSABLE) ×2 IMPLANT
TUBING ARTHROSCOPY IRRIG 16FT (MISCELLANEOUS) ×2 IMPLANT
WATER STERILE IRR 1000ML POUR (IV SOLUTION) ×2 IMPLANT
WRAP KNEE MAXI GEL POST OP (GAUZE/BANDAGES/DRESSINGS) ×1000 IMPLANT

## 2019-02-02 NOTE — Anesthesia Procedure Notes (Signed)
Procedure Name: LMA Insertion Date/Time: 02/02/2019 10:45 AM Performed by: Gerald Leitz, CRNA Pre-anesthesia Checklist: Patient identified, Patient being monitored, Timeout performed, Emergency Drugs available and Suction available Patient Re-evaluated:Patient Re-evaluated prior to induction Oxygen Delivery Method: Circle system utilized Preoxygenation: Pre-oxygenation with 100% oxygen Induction Type: IV induction Ventilation: Mask ventilation without difficulty LMA: LMA inserted LMA Size: 4.0 Tube type: Oral Number of attempts: 1 Placement Confirmation: positive ETCO2 and breath sounds checked- equal and bilateral Tube secured with: Tape Dental Injury: Teeth and Oropharynx as per pre-operative assessment

## 2019-02-02 NOTE — Transfer of Care (Signed)
Immediate Anesthesia Transfer of Care Note  Patient: Meghan Schwartz  Procedure(s) Performed: Procedure(s): LEFT KNEE ARTHROSCOPY, MEDIAL AND LATERAL MENISECTOMY, EXCISION OF LEFT LATERAL MENISCAL CYST (Left)  Patient Location: PACU  Anesthesia Type:General  Level of Consciousness: Alert, Awake, Oriented  Airway & Oxygen Therapy: Patient Spontanous Breathing  Post-op Assessment: Report given to RN  Post vital signs: Reviewed and stable  Last Vitals: There were no vitals filed for this visit.  Complications: No apparent anesthesia complications

## 2019-02-02 NOTE — Op Note (Signed)
PATIENT ID:      OLUWASEUN BRUYERE  MRN:     [id number] DOB/AGE:    1950-10-01 / 68 y.o.       OPERATIVE REPORT    DATE OF PROCEDURE:  02/02/2019       PREOPERATIVE DIAGNOSIS:   Tears medial and lateral menisci left knee with large lateral meniscal cyst. Tricompartmental DJD                                                       Estimated body mass index is 24.79 kg/m as calculated from the following:   Height as of this encounter: 5\' 5"  (1.651 m).   Weight as of this encounter: 67.6 kg.     POSTOPERATIVE DIAGNOSIS:   same                                                                     Estimated body mass index is 24.79 kg/m as calculated from the following:   Height as of this encounter: 5\' 5"  (1.651 m).   Weight as of this encounter: 67.6 kg.     PROCEDURE:  Procedure(s): LEFT KNEE ARTHROSCOPY,PARTIAL MEDIAL AND LATERAL MENISECTOMY,open EXCISION OF LEFT LATERAL MENISCAL CYST      SURGEON:  Joni Fears, MD    ASSISTANT:   Biagio Borg, PA-C   (Present and scrubbed throughout the case, critical for assistance with exposure, retraction, instrumentation, and closure.)          ANESTHESIA: general     DRAINS: none :      TOURNIQUET TIME:  Total Tourniquet Time Documented: Thigh (Left) - 26 minutes Total: Thigh (Left) - 26 minutes     COMPLICATIONS:  None    CONDITION:  stable  PROCEDURE IN DETAIL:  [place] 02/02/2019, 12:08 PM

## 2019-02-02 NOTE — Progress Notes (Signed)
Dr Linna Caprice evaluated pt and satisfied pt can go home.

## 2019-02-02 NOTE — Progress Notes (Signed)
Pt discharged with ice pack , extra ice fillers, incentive spirometer, and walker. Spouse has filled pain scriptand aware to continue Plavix.

## 2019-02-02 NOTE — Progress Notes (Signed)
Pt's spouse reports he had prescription for oxy IR filled, spouse unaware when to restar Plavix, Dr Tanda Rockers paged.

## 2019-02-02 NOTE — H&P (Signed)
The recent History & Physical has been reviewed. I have personally examined the patient today. There is no interval change to the documented History & Physical. The patient would like to proceed with the procedure.  Garald Balding 02/02/2019,  10:01 AM

## 2019-02-02 NOTE — Progress Notes (Signed)
Dr Durward Fortes notified and clarified Plaviix to continue as prescibed(not stopped), may go home with sat 89% on room air.

## 2019-02-02 NOTE — Op Note (Signed)
NAMESAKI, LEGORE MEDICAL RECORD [id number] ACCOUNT [id number] DATE OF BIRTH:1950-11-14 FACILITY: [facility] LOCATION: [facility] PH[facility]ICIAN:Raeven Pint Sharlotte Alamo, MD  OPERATIVE REPORT  DATE OF PROCEDURE:  02/02/2019  PREOPERATIVE DIAGNOSES:   1.  Tear of medial and lateral menisci, left knee with large lateral meniscal cyst. 2.  Tricompartmental degenerative joint disease.  POSTOPERATIVE DIAGNOSES:   1.  Tear of medial and lateral menisci, left knee with large lateral meniscal cyst. 2.  Tricompartmental degenerative joint disease.  PROCEDURES: 1.  Diagnostic arthroscopy, left knee. 2.  Partial medial and lateral meniscectomies. 3.  Open excision of lateral meniscal cyst.  SURGEON:  Joni Fears, MD  ASSISTANT:  Biagio Borg, PA-C.  ANESTHESIA:  General.  COMPLICATIONS:  None.  HISTORY:  A 68 year old female who has been followed for many months with problems referable to her left knee.  She has evidence of a large mass along the lateral aspect of her knee that appears to be related to tears of the lateral menisci.  She has had  an MRI scan demonstrating tricompartmental degenerative changes associated with tears of the medial and lateral menisci and a large meniscal cyst.  She has had a poor response to conservative treatment.  Based on her comorbidities, we elected to proceed  with knee arthroscopy and excision of the large cyst.  DESCRIPTION OF PROCEDURE:  The patient was met in the holding area and identified the left knee as the appropriate operative site and marked it accordingly.  The patient was then transported to room #4 and carefully placed on the operating table with  general anesthesia induced without difficulty.  The left lower extremity was placed in a thigh tourniquet.  The left lower extremity was then prepared with chlorhexidine scrub and DuraPrep from the tourniquet to the tips of the toes.  Sterile draping was performed.  A timeout was called.  Initial  procedure was performed with an arthroscopy.  I infiltrated the medial and lateral portals and then made a small incision with a 15-blade knife.  The arthroscope was placed in the lateral portal.  The joint was entered.   There was a clear yellow joint effusion.  Diagnostic arthroscopy revealed diffuse beefy red synovitis.  There were areas of grade III changes of chondromalacia of the patella and corresponding trochlea.  Areas of synovitis were debrided with the  Arthrocare wand.  Medially, there were some grade III changes of chondromalacia in both the femur and the tibia and a macerated tear of the posterior third of the meniscus.  These included horizontal cleavage tears and radial tears.  These were debrided with the basket  forceps, the Cuda shaver and then the Arthrocare wand.  I did not see any bleeding.  I did not see exposed subchondral bone, but again there were changes of grade III chondromalacia.  ACL and PCL appeared to be intact.  There were areas of synovitis.  The patient does have history of rheumatoid arthritis.  This certainly could be some component of RA with her arthritis.  In the lateral compartment, there was a chronic tear of the  anterior half of the lateral meniscus with a bucket handle tear.  This was debrided back to stable meniscal rim.  Any bleeding was controlled with the Arthrocare wand.  There was an area of grade IV chondromalacia on the posterior third of the tibial  plateau and some grade III changes on the femur.  The joint was then explored without evidence of loose material.  We then redraped, maintained  a sterile field and proceeded with excision of the lateral meniscal cyst.  We did elevate a proximal thigh tourniquet 325 mmHg after Esmarch exsanguination.  I made about a 2 inch incision with a 15 blade knife directly over  the mass and via sharp dissection carried down to subcutaneous tissue.  Then, via blunt dissection, the mass was elevated from the  surrounding soft tissue.  It was quite adherent.  I then incised the apex of the cyst.  It was filled with a small amount  of fluid, but there was significant amount of gelatinous material.  Some of this was sent to the lab for sectioning.  I debrided all this material, then found a stalk entering the lateral joint.  This was completely debrided.  Part of the IT band had  been elevated.  At that point, any opening into the joint was closed with a 3-0 Vicryl.  The wound was irrigated with saline solution.  I closed the wound in several layers with 0 Vicryl, 3-0 Monocryl and then skin clips.  We released the tourniquet at  26 minutes and any bleeding was controlled with the Arthrocare wand and compression.  A sterile bulky dressing was applied followed by an Ace bandage and a knee immobilizer with the knee in extension.  The patient tolerated the procedures without complications.  PLAN:  OxyIR for pain.  She did receive 2 g of Ancef IV.  We will provide a walker and plan to see her back in the office in a week.  VN/NUANCE  D:02/02/2019 T:02/02/2019 JOB:[id number]

## 2019-02-03 LAB — SURGICAL PATHOLOGY

## 2019-02-03 NOTE — Anesthesia Postprocedure Evaluation (Signed)
Anesthesia Post Note  Patient: Meghan Schwartz  Procedure(s) Performed: LEFT KNEE ARTHROSCOPY, MEDIAL AND LATERAL MENISECTOMY, EXCISION OF LEFT LATERAL MENISCAL CYST (Left Knee)     Patient location during evaluation: PACU Anesthesia Type: General Level of consciousness: awake and alert Pain management: pain level controlled Vital Signs Assessment: post-procedure vital signs reviewed and stable Respiratory status: spontaneous breathing, nonlabored ventilation and respiratory function stable Cardiovascular status: blood pressure returned to baseline and stable Postop Assessment: no apparent nausea or vomiting Anesthetic complications: no    Last Vitals:  Vitals:   02/02/19 1500 02/02/19 1602  BP: 133/83 137/79  Pulse: 66 73  Resp: 12 16  Temp: 36.9 C 36.7 C  SpO2: 92% 98%    Last Pain:  Vitals:   02/02/19 1602  TempSrc: Oral  PainSc: 2                  Lidia Collum

## 2019-02-09 ENCOUNTER — Other Ambulatory Visit: Payer: Self-pay

## 2019-02-09 ENCOUNTER — Encounter: Payer: Self-pay | Admitting: Orthopaedic Surgery

## 2019-02-09 ENCOUNTER — Ambulatory Visit (INDEPENDENT_AMBULATORY_CARE_PROVIDER_SITE_OTHER): Payer: Medicare Other | Admitting: Orthopaedic Surgery

## 2019-02-09 VITALS — Ht 65.0 in | Wt 149.0 lb

## 2019-02-09 DIAGNOSIS — M23352 Other meniscus derangements, posterior horn of lateral meniscus, left knee: Secondary | ICD-10-CM

## 2019-02-09 DIAGNOSIS — M1712 Unilateral primary osteoarthritis, left knee: Secondary | ICD-10-CM

## 2019-02-09 NOTE — Progress Notes (Signed)
Office Visit Note   Patient: Meghan Schwartz           Date of Birth: September 08, 1950           MRN: [id number] Visit Date: 02/09/2019              Requested by: Maurice Small, MD [place],  [place] [address] PCP: Maurice Small, MD   Assessment & Plan: Visit Diagnoses:  1. Primary osteoarthritis of left knee   2. Derangement of posterior horn of lateral meniscus of left knee     Plan: 1 week status post arthroscopic debridement of both medial lateral meniscal tears and excision of a large lateral meniscal cyst.  Doing well.  Chronic pain is going to be an issue as she has been involved in a pain program.  Took her last OxyIR today and will follow up with her pain doctor for further prescriptions of Suboxone.  No fever or chills.  Wounds are healing nicely.  I removed every other staple and applied Steri-Strips over benzoin.  May get the incision wet.  Will work on range of motion and discontinue the knee immobilizer.  No calf pain.  We will plan to see her in a week  Follow-Up Instructions: Return in about 1 week (around 02/16/2019).   Orders:  No orders of the defined types were placed in this encounter.  No orders of the defined types were placed in this encounter.     Procedures: No procedures performed   Clinical Data: No additional findings.   Subjective: Chief Complaint  Patient presents with  . Left Knee - Routine Post Op    Left knee scope DOS 02/02/2019  Patient presents today for follow up on her left knee. She had a left knee arthroscopy on 02/02/2019. She is now one week out from surgery. She said that her knee feels "pretty good most of the time". She is taking Tylenol and Oxycodone for pain. She is wearing a knee immobilizer.  Surgery involved an arthroscopic debridement of both the medial lateral meniscal tear.  There was considerable degenerative change but tickly in the lateral compartment where there are areas of exposed subchondral bone  grade IV chondromalacia.  Also excised a large lateral meniscal cyst.  There was considerable tearing along the anterior half of the lateral meniscus in multiple planes.  HPI  Review of Systems   Objective: Vital Signs: Ht 5\' 5"  (1.651 m)   Wt 149 lb (67.6 kg)   BMI 24.79 kg/m   Physical Exam  Ortho Exam arthroscopic portals left knee healing without problem.  Longitudinal incision to remove the cyst also is healing without a problem.  Removed every other staple and applied Steri-Strips over benzoin.  No effusion.  Full extension able to flex probably 75 degrees.  Motor exam intact.  Sensory exam without change from preop status  Specialty Comments:  No specialty comments available.  Imaging: No results found.   PMFS History: Patient Active Problem List   Diagnosis Date Noted  . Derangement of posterior horn of medial meniscus 02/02/2019  . Derangement of posterior horn of lateral meniscus 02/02/2019  . Meniscal cyst, unspecified laterality 12/01/2018  . Torn medial meniscus 12/01/2018  . Primary osteoarthritis of left knee 12/01/2018  . Coronary artery disease involving native coronary artery of native heart without angina pectoris 09/14/2018  . Chronic diastolic (congestive) heart failure ([place])   . Chronic pain of left knee 05/15/2018  . Mass of  left knee 05/15/2018  . Non-ST elevation (NSTEMI) myocardial infarction (HCC)   . Chest pain 01/25/2018  . RA (rheumatoid arthritis) (HCC) 01/25/2018  . Hyperlipidemia 01/25/2018  . Hypokalemia 01/25/2018  . Depression with anxiety 01/25/2018  . Tobacco abuse 01/25/2018  . Lower extremity cellulitis 01/25/2018  . Benzodiazepine dependence (HCC)   . MDD (major depressive disorder), recurrent severe, without psychosis (HCC) 10/02/2017  . CTS (carpal tunnel syndrome) 06/08/2014   Past Medical History:  Diagnosis Date  . Allergic rhinitis   . Anxiety   . Benzodiazepine dependence (HCC)   . Bruxism   . CAD (coronary artery  disease)    S/p NSTEMI 01/2018 >> LHC: oLAD 20, pLAD 20; oD1 20, oLCx 40, pRCA 99 >> PCI: DES to prox RCA // Echo 01/2018: EF 50-55; prob inf-lat and inf HK  . Chronic diastolic heart failure    Echocardiogram 07/2018: EF 55-60, grade 1 diastolic dysfunction, normal wall motion, normal GLS (-22.3), PASP 28  . Chronic foot pain    bunions, torn ligaments-on chronic pain medication  . CTS (carpal tunnel syndrome) 06/08/2014  . Depression   . High cholesterol   . Hypertension   . MDD (major depressive disorder) 10/02/2017  . Migraine   . Myocardial infarction (HCC) 02/01/2018  . Pneumonia 1986   left lung  . RA (rheumatoid arthritis) (HCC)     Family History  Problem Relation Age of Onset  . Thyroid disease Mother   . Breast cancer Mother   . Parkinson's disease Father   . Heart attack Paternal Grandfather   . Stroke Paternal Grandmother   . Arthritis Maternal Grandmother   . Heart attack Maternal Grandmother   . Raynaud syndrome Maternal Aunt     Past Surgical History:  Procedure Laterality Date  . BTL  2000  . CARPAL TUNNEL RELEASE Left   . CESAREAN SECTION  1978. 1983  . CORONARY STENT INTERVENTION N/A 01/27/2018   Procedure: CORONARY STENT INTERVENTION;  Surgeon: Kathleene Hazel, MD;  [facility];  Service: Cardiovascular;  Laterality: N/A;  . eye implants    . FOOT SURGERY Left 2008  . front teeth removed after injury    . KNEE ARTHROSCOPY WITH LATERAL MENISECTOMY Left 02/02/2019   Procedure: LEFT KNEE ARTHROSCOPY, MEDIAL AND LATERAL MENISECTOMY, EXCISION OF LEFT LATERAL MENISCAL CYST;  Surgeon: Valeria Batman, MD;  Location: [facility];  Service: Orthopedics;  Laterality: Left;  . LEFT HEART CATH AND CORONARY ANGIOGRAPHY N/A 01/27/2018   Procedure: LEFT HEART CATH AND CORONARY ANGIOGRAPHY;  Surgeon: Kathleene Hazel, MD;  [facility];  Service: Cardiovascular;  Laterality: N/A;  . pre cancerous lesion removed from forehead    .  TOTAL ABDOMINAL HYSTERECTOMY W/ BILATERAL SALPINGOOPHORECTOMY  2002   Dr. Jackelyn Knife  . VAGINAL HYSTERECTOMY  2001   Social History   Occupational History  . Occupation: Retired   Tobacco Use  . Smoking status: Current Every Day Smoker    Years: 20.00    Types: Cigarettes    Last attempt to quit: 02/25/2010    Years since quitting: 8.9  . Smokeless tobacco: Never Used  . Tobacco comment: Restarted smoking earlier this year. smokes during stressful time   Substance and Sexual Activity  . Alcohol use: No    Alcohol/week: 0.0 standard drinks  . Drug use: No  . Sexual activity: Not on file

## 2019-02-17 ENCOUNTER — Encounter: Payer: Self-pay | Admitting: Orthopaedic Surgery

## 2019-02-17 ENCOUNTER — Other Ambulatory Visit: Payer: Self-pay

## 2019-02-17 ENCOUNTER — Ambulatory Visit (INDEPENDENT_AMBULATORY_CARE_PROVIDER_SITE_OTHER): Payer: Medicare Other | Admitting: Orthopaedic Surgery

## 2019-02-17 VITALS — Ht 65.0 in | Wt 149.0 lb

## 2019-02-17 DIAGNOSIS — M23352 Other meniscus derangements, posterior horn of lateral meniscus, left knee: Secondary | ICD-10-CM

## 2019-02-17 DIAGNOSIS — M23322 Other meniscus derangements, posterior horn of medial meniscus, left knee: Secondary | ICD-10-CM

## 2019-02-17 DIAGNOSIS — M23009 Cystic meniscus, unspecified meniscus, unspecified knee: Secondary | ICD-10-CM

## 2019-02-17 NOTE — Progress Notes (Signed)
Office Visit Note   Patient: Meghan Schwartz           Date of Birth: 1951/02/23           MRN: [id number] Visit Date: 02/17/2019              Requested by: Shirlean Mylar, MD [address] [place],  [place] [address] PCP: Shirlean Mylar, MD   Assessment & Plan: Visit Diagnoses:  1. Derangement of posterior horn of lateral meniscus of left knee   2. Derangement of posterior horn of medial meniscus of left knee   3. Meniscal cyst, unspecified laterality     Plan: 2 weeks status post left knee arthroscopy with debridement of medial lateral meniscal tears.  Also had excision of a large lateral meniscal cyst.  Doing well.  Not using any ambulatory aid.  I remove the remaining staples from the lateral wound.  Range of motion 0 to 90 degrees.  Return in 2 weeks.  Continue with range of motion exercises.  No calf pain.  Does have chronic venous stasis changes and some swelling more of her left than her right ankle which also is chronic but may be a little bit more since the surgery on the left.  Continue with range of motion and strengthening exercises.  We will hold off on physical therapy until she is reevaluated in a month and reconsider need  Follow-Up Instructions: Return in about 2 weeks (around 03/03/2019).   Orders:  No orders of the defined types were placed in this encounter.  No orders of the defined types were placed in this encounter.     Procedures: No procedures performed   Clinical Data: No additional findings.   Subjective: Chief Complaint  Patient presents with  . Left Knee - Follow-up    Left knee arthroscopy DOS 02/02/2019  Patient presents today for follow up on her left knee. She had a left knee arthroscopy on 02/02/2019. She is now 2 weeks out from surgery. She is doing well. No complaints. She is taking Tylenol as needed.  Not experiencing any shortness of breath chest pain fever or chills.  Having some crepitation with patella motion as expected given  the amount of arthritis at the patellofemoral joint  HPI  Review of Systems   Objective: Vital Signs: Ht 5\' 5"  (1.651 m)   Wt 149 lb (67.6 kg)   BMI 24.79 kg/m   Physical Exam  Ortho Exam awake alert and oriented x3.  Comfortable sitting.  Left knee lateral incision healing without problem.  The remaining 4 staples were removed.  Small knee effusion but able to flex 90 degrees with full extension.  Has chronic scaling of the skin along the distal third of her leg with some venous stasis changes.  Has chronic swelling but a little bit more since surgery.  No calf pain.  Motor exam intact.  Not using any ambulatory aid  Specialty Comments:  No specialty comments available.  Imaging: No results found.   PMFS History: Patient Active Problem List   Diagnosis Date Noted  . Derangement of posterior horn of medial meniscus 02/02/2019  . Derangement of posterior horn of lateral meniscus 02/02/2019  . Meniscal cyst, unspecified laterality 12/01/2018  . Torn medial meniscus 12/01/2018  . Primary osteoarthritis of left knee 12/01/2018  . Coronary artery disease involving native coronary artery of native heart without angina pectoris 09/14/2018  . Chronic diastolic (congestive) heart failure (HCC)   . Chronic pain  of left knee 05/15/2018  . Mass of left knee 05/15/2018  . Non-ST elevation (NSTEMI) myocardial infarction ([place])   . Chest pain 01/25/2018  . RA (rheumatoid arthritis) ([place]) 01/25/2018  . Hyperlipidemia 01/25/2018  . Hypokalemia 01/25/2018  . Depression with anxiety 01/25/2018  . Tobacco abuse 01/25/2018  . Lower extremity cellulitis 01/25/2018  . Benzodiazepine dependence ([place])   . MDD (major depressive disorder), recurrent severe, without psychosis ([place]) 10/02/2017  . CTS (carpal tunnel syndrome) 06/08/2014   Past Medical History:  Diagnosis Date  . Allergic rhinitis   . Anxiety   . Benzodiazepine dependence ([place])   . Bruxism   . CAD (coronary artery disease)     S/p NSTEMI 01/2018 >> LHC: oLAD 20, pLAD 20; oD1 20, oLCx 40, pRCA 99 >> PCI: DES to prox RCA // Echo 01/2018: EF 50-55; prob inf-lat and inf HK  . Chronic diastolic heart failure    Echocardiogram 07/2018: EF 85-27, grade 1 diastolic dysfunction, normal wall motion, normal GLS (-22.3), PASP 28  . Chronic foot pain    bunions, torn ligaments-on chronic pain medication  . CTS (carpal tunnel syndrome) 06/08/2014  . Depression   . High cholesterol   . Hypertension   . MDD (major depressive disorder) 10/02/2017  . Migraine   . Myocardial infarction ([place]) 02/01/2018  . Pneumonia 1986   left lung  . RA (rheumatoid arthritis) (HCC)     Family History  Problem Relation Age of Onset  . Thyroid disease Mother   . Breast cancer Mother   . Parkinson's disease Father   . Heart attack Paternal Grandfather   . Stroke Paternal Grandmother   . Arthritis Maternal Grandmother   . Heart attack Maternal Grandmother   . Raynaud syndrome Maternal Aunt     Past Surgical History:  Procedure Laterality Date  . BTL  2000  . CARPAL TUNNEL RELEASE Left   . [place]  . CORONARY STENT INTERVENTION N/A 01/27/2018   Procedure: CORONARY STENT INTERVENTION;  Surgeon: Burnell Blanks, MD;  Location: [facility];  Service: Cardiovascular;  Laterality: N/A;  . eye implants    . FOOT SURGERY Left 2008  . front teeth removed after injury    . KNEE ARTHROSCOPY WITH LATERAL MENISECTOMY Left 02/02/2019   Procedure: LEFT KNEE ARTHROSCOPY, MEDIAL AND LATERAL MENISECTOMY, EXCISION OF LEFT LATERAL MENISCAL CYST;  Surgeon: Garald Balding, MD;  Location: [facility];  Service: Orthopedics;  Laterality: Left;  . LEFT HEART CATH AND CORONARY ANGIOGRAPHY N/A 01/27/2018   Procedure: LEFT HEART CATH AND CORONARY ANGIOGRAPHY;  Surgeon: Burnell Blanks, MD;  Location: [facility];  Service: Cardiovascular;  Laterality: N/A;  . pre cancerous lesion removed from forehead    . TOTAL  ABDOMINAL HYSTERECTOMY W/ BILATERAL SALPINGOOPHORECTOMY  2002   Dr. Willis Modena  . VAGINAL HYSTERECTOMY  2001   Social History   Occupational History  . Occupation: Retired   Tobacco Use  . Smoking status: Current Every Day Smoker    Years: 20.00    Types: Cigarettes    Last attempt to quit: 02/25/2010    Years since quitting: 8.9  . Smokeless tobacco: Never Used  . Tobacco comment: Restarted smoking earlier this year. smokes during stressful time   Substance and Sexual Activity  . Alcohol use: No    Alcohol/week: 0.0 standard drinks  . Drug use: No  . Sexual activity: Not on file

## 2019-03-03 ENCOUNTER — Other Ambulatory Visit: Payer: Self-pay

## 2019-03-03 ENCOUNTER — Encounter: Payer: Self-pay | Admitting: Orthopaedic Surgery

## 2019-03-03 ENCOUNTER — Ambulatory Visit: Payer: Medicare Other | Admitting: Orthopaedic Surgery

## 2019-03-03 VITALS — Ht 65.0 in | Wt 149.0 lb

## 2019-03-03 DIAGNOSIS — M23009 Cystic meniscus, unspecified meniscus, unspecified knee: Secondary | ICD-10-CM

## 2019-03-03 NOTE — Progress Notes (Signed)
Office Visit Note   Patient: Meghan Schwartz           Date of Birth: 1950-04-01           MRN: [id number] Visit Date: 03/03/2019              Requested by: Shirlean Mylar, MD [address] [place],  [place] [address] PCP: Shirlean Mylar, MD   Assessment & Plan: Visit Diagnoses:  1. Meniscal cyst, unspecified laterality     Plan: 1 month status post left knee arthroscopy with partial medial lateral meniscectomy and open excision of a large lateral meniscal cyst.  Was doing well until she fell at Christmas.  Now having some pain in the posterior aspect of her left thigh.  Not using any ambulatory aid.  I do not see any evidence of significant injury.  I think it is worth trying some physical therapy to help strengthen her leg.  Her knee was not hot red or  Warm.  we will plan to see her back in a month.  Follow-Up Instructions: Return in about 1 month (around 04/03/2019).   Orders:  No orders of the defined types were placed in this encounter.  No orders of the defined types were placed in this encounter.     Procedures: No procedures performed   Clinical Data: No additional findings.   Subjective: Chief Complaint  Patient presents with  . Left Knee - Follow-up    Left knee scope DOS 02/02/2019  Patient presents today for follow up on her left knee. She had a left knee arthroscopy with medial lateral meniscal tears on 02/02/2019. She is now four weeks out from surgery. She said that she was doing well until she fell over some Christmas decorations 5 days ago. Her pain in her knee has increased, but most of her pain is in the posterior aspect of her thigh on the left side.   HPI  Review of Systems   Objective: Vital Signs: Ht 5\' 5"  (1.651 m)   Wt 149 lb (67.6 kg)   BMI 24.79 kg/m   Physical Exam  Ortho Exam left knee with meniscal cyst incision healing without problem.  Very minimal swelling around the knee.  I do not think she has an effusion.  Full  extension about 95 degrees of flexion.  Some very minimal discomfort in the posterior thigh but no ecchymosis.  There is some induration diffusely in her left leg which is chronic and old bitten along the posterior thigh but no redness or evidence of cellulitis.  Has less swelling in her left ankle compared to her last office visit and no calf pain Specialty Comments:  No specialty comments available.  Imaging: No results found.   PMFS History: Patient Active Problem List   Diagnosis Date Noted  . Derangement of posterior horn of medial meniscus 02/02/2019  . Derangement of posterior horn of lateral meniscus 02/02/2019  . Meniscal cyst, unspecified laterality 12/01/2018  . Torn medial meniscus 12/01/2018  . Primary osteoarthritis of left knee 12/01/2018  . Coronary artery disease involving native coronary artery of native heart without angina pectoris 09/14/2018  . Chronic diastolic (congestive) heart failure (HCC)   . Chronic pain of left knee 05/15/2018  . Mass of left knee 05/15/2018  . Non-ST elevation (NSTEMI) myocardial infarction (HCC)   . Chest pain 01/25/2018  . RA (rheumatoid arthritis) (HCC) 01/25/2018  . Hyperlipidemia 01/25/2018  . Hypokalemia 01/25/2018  . Depression with anxiety 01/25/2018  .  Tobacco abuse 01/25/2018  . Lower extremity cellulitis 01/25/2018  . Benzodiazepine dependence ([place])   . MDD (major depressive disorder), recurrent severe, without psychosis ([place]) 10/02/2017  . CTS (carpal tunnel syndrome) 06/08/2014   Past Medical History:  Diagnosis Date  . Allergic rhinitis   . Anxiety   . Benzodiazepine dependence ([place])   . Bruxism   . CAD (coronary artery disease)    S/p NSTEMI 01/2018 >> LHC: oLAD 20, pLAD 20; oD1 20, oLCx 40, pRCA 99 >> PCI: DES to prox RCA // Echo 01/2018: EF 50-55; prob inf-lat and inf HK  . Chronic diastolic heart failure    Echocardiogram 07/2018: EF 28-36, grade 1 diastolic dysfunction, normal wall motion, normal GLS (-22.3),  PASP 28  . Chronic foot pain    bunions, torn ligaments-on chronic pain medication  . CTS (carpal tunnel syndrome) 06/08/2014  . Depression   . High cholesterol   . Hypertension   . MDD (major depressive disorder) 10/02/2017  . Migraine   . Myocardial infarction ([place]) 02/01/2018  . Pneumonia 1986   left lung  . RA (rheumatoid arthritis) (HCC)     Family History  Problem Relation Age of Onset  . Thyroid disease Mother   . Breast cancer Mother   . Parkinson's disease Father   . Heart attack Paternal Grandfather   . Stroke Paternal Grandmother   . Arthritis Maternal Grandmother   . Heart attack Maternal Grandmother   . Raynaud syndrome Maternal Aunt     Past Surgical History:  Procedure Laterality Date  . BTL  2000  . CARPAL TUNNEL RELEASE Left   . [place]  . CORONARY STENT INTERVENTION N/A 01/27/2018   Procedure: CORONARY STENT INTERVENTION;  Surgeon: Burnell Blanks, MD;  Location: [facility];  Service: Cardiovascular;  Laterality: N/A;  . eye implants    . FOOT SURGERY Left 2008  . front teeth removed after injury    . KNEE ARTHROSCOPY WITH LATERAL MENISECTOMY Left 02/02/2019   Procedure: LEFT KNEE ARTHROSCOPY, MEDIAL AND LATERAL MENISECTOMY, EXCISION OF LEFT LATERAL MENISCAL CYST;  Surgeon: Garald Balding, MD;  Location: [facility];  Service: Orthopedics;  Laterality: Left;  . LEFT HEART CATH AND CORONARY ANGIOGRAPHY N/A 01/27/2018   Procedure: LEFT HEART CATH AND CORONARY ANGIOGRAPHY;  Surgeon: Burnell Blanks, MD;  Location: [facility];  Service: Cardiovascular;  Laterality: N/A;  . pre cancerous lesion removed from forehead    . TOTAL ABDOMINAL HYSTERECTOMY W/ BILATERAL SALPINGOOPHORECTOMY  2002   Dr. Willis Modena  . VAGINAL HYSTERECTOMY  2001   Social History   Occupational History  . Occupation: Retired   Tobacco Use  . Smoking status: Current Every Day Smoker    Years: 20.00    Types: Cigarettes    Last attempt  to quit: 02/25/2010    Years since quitting: 9.0  . Smokeless tobacco: Never Used  . Tobacco comment: Restarted smoking earlier this year. smokes during stressful time   Substance and Sexual Activity  . Alcohol use: No    Alcohol/week: 0.0 standard drinks  . Drug use: No  . Sexual activity: Not on file

## 2019-03-03 NOTE — Addendum Note (Signed)
Addended by: Wendi Maya on: 03/03/2019 02:56 PM   Modules accepted: Orders

## 2019-03-05 ENCOUNTER — Emergency Department (HOSPITAL_COMMUNITY): Payer: Medicare PPO

## 2019-03-05 ENCOUNTER — Observation Stay (HOSPITAL_COMMUNITY)
Admission: EM | Admit: 2019-03-05 | Discharge: 2019-03-06 | Disposition: A | Discharge status: Home / Self Care | Payer: Medicare PPO | Attending: Internal Medicine | Admitting: Internal Medicine

## 2019-03-05 ENCOUNTER — Other Ambulatory Visit: Payer: Self-pay

## 2019-03-05 DIAGNOSIS — G8929 Other chronic pain: Secondary | ICD-10-CM | POA: Insufficient documentation

## 2019-03-05 DIAGNOSIS — F419 Anxiety disorder, unspecified: Secondary | ICD-10-CM | POA: Insufficient documentation

## 2019-03-05 DIAGNOSIS — M5137 Other intervertebral disc degeneration, lumbosacral region: Secondary | ICD-10-CM | POA: Insufficient documentation

## 2019-03-05 DIAGNOSIS — Z803 Family history of malignant neoplasm of breast: Secondary | ICD-10-CM | POA: Insufficient documentation

## 2019-03-05 DIAGNOSIS — R0902 Hypoxemia: Secondary | ICD-10-CM | POA: Diagnosis present

## 2019-03-05 DIAGNOSIS — F1320 Sedative, hypnotic or anxiolytic dependence, uncomplicated: Secondary | ICD-10-CM | POA: Insufficient documentation

## 2019-03-05 DIAGNOSIS — F332 Major depressive disorder, recurrent severe without psychotic features: Secondary | ICD-10-CM | POA: Diagnosis not present

## 2019-03-05 DIAGNOSIS — I252 Old myocardial infarction: Secondary | ICD-10-CM | POA: Insufficient documentation

## 2019-03-05 DIAGNOSIS — J9621 Acute and chronic respiratory failure with hypoxia: Secondary | ICD-10-CM | POA: Diagnosis not present

## 2019-03-05 DIAGNOSIS — M79673 Pain in unspecified foot: Secondary | ICD-10-CM | POA: Insufficient documentation

## 2019-03-05 DIAGNOSIS — I700 Atherosclerosis of aorta: Secondary | ICD-10-CM | POA: Diagnosis not present

## 2019-03-05 DIAGNOSIS — Z79899 Other long term (current) drug therapy: Secondary | ICD-10-CM | POA: Insufficient documentation

## 2019-03-05 DIAGNOSIS — I5033 Acute on chronic diastolic (congestive) heart failure: Secondary | ICD-10-CM | POA: Diagnosis not present

## 2019-03-05 DIAGNOSIS — Z820 Family history of epilepsy and other diseases of the nervous system: Secondary | ICD-10-CM | POA: Insufficient documentation

## 2019-03-05 DIAGNOSIS — R918 Other nonspecific abnormal finding of lung field: Secondary | ICD-10-CM | POA: Insufficient documentation

## 2019-03-05 DIAGNOSIS — R072 Precordial pain: Secondary | ICD-10-CM | POA: Diagnosis present

## 2019-03-05 DIAGNOSIS — F17210 Nicotine dependence, cigarettes, uncomplicated: Secondary | ICD-10-CM | POA: Diagnosis not present

## 2019-03-05 DIAGNOSIS — K8020 Calculus of gallbladder without cholecystitis without obstruction: Secondary | ICD-10-CM | POA: Diagnosis not present

## 2019-03-05 DIAGNOSIS — Z7902 Long term (current) use of antithrombotics/antiplatelets: Secondary | ICD-10-CM | POA: Insufficient documentation

## 2019-03-05 DIAGNOSIS — Z881 Allergy status to other antibiotic agents status: Secondary | ICD-10-CM | POA: Insufficient documentation

## 2019-03-05 DIAGNOSIS — I110 Hypertensive heart disease with heart failure: Secondary | ICD-10-CM | POA: Diagnosis not present

## 2019-03-05 DIAGNOSIS — Z90722 Acquired absence of ovaries, bilateral: Secondary | ICD-10-CM | POA: Insufficient documentation

## 2019-03-05 DIAGNOSIS — M069 Rheumatoid arthritis, unspecified: Secondary | ICD-10-CM | POA: Insufficient documentation

## 2019-03-05 DIAGNOSIS — Z20822 Contact with and (suspected) exposure to covid-19: Secondary | ICD-10-CM | POA: Insufficient documentation

## 2019-03-05 DIAGNOSIS — Z8261 Family history of arthritis: Secondary | ICD-10-CM | POA: Insufficient documentation

## 2019-03-05 DIAGNOSIS — N281 Cyst of kidney, acquired: Secondary | ICD-10-CM | POA: Insufficient documentation

## 2019-03-05 DIAGNOSIS — I2510 Atherosclerotic heart disease of native coronary artery without angina pectoris: Secondary | ICD-10-CM | POA: Diagnosis present

## 2019-03-05 DIAGNOSIS — I5030 Unspecified diastolic (congestive) heart failure: Secondary | ICD-10-CM | POA: Diagnosis present

## 2019-03-05 DIAGNOSIS — E7800 Pure hypercholesterolemia, unspecified: Secondary | ICD-10-CM | POA: Insufficient documentation

## 2019-03-05 DIAGNOSIS — M542 Cervicalgia: Secondary | ICD-10-CM | POA: Insufficient documentation

## 2019-03-05 DIAGNOSIS — M1712 Unilateral primary osteoarthritis, left knee: Secondary | ICD-10-CM | POA: Insufficient documentation

## 2019-03-05 DIAGNOSIS — I5032 Chronic diastolic (congestive) heart failure: Secondary | ICD-10-CM | POA: Diagnosis present

## 2019-03-05 DIAGNOSIS — Z720 Tobacco use: Secondary | ICD-10-CM | POA: Diagnosis present

## 2019-03-05 DIAGNOSIS — E785 Hyperlipidemia, unspecified: Secondary | ICD-10-CM | POA: Insufficient documentation

## 2019-03-05 DIAGNOSIS — Z823 Family history of stroke: Secondary | ICD-10-CM | POA: Insufficient documentation

## 2019-03-05 DIAGNOSIS — M5136 Other intervertebral disc degeneration, lumbar region: Secondary | ICD-10-CM | POA: Insufficient documentation

## 2019-03-05 DIAGNOSIS — Z90710 Acquired absence of both cervix and uterus: Secondary | ICD-10-CM | POA: Insufficient documentation

## 2019-03-05 DIAGNOSIS — R079 Chest pain, unspecified: Secondary | ICD-10-CM | POA: Diagnosis present

## 2019-03-05 DIAGNOSIS — Z8249 Family history of ischemic heart disease and other diseases of the circulatory system: Secondary | ICD-10-CM | POA: Insufficient documentation

## 2019-03-05 DIAGNOSIS — M179 Osteoarthritis of knee, unspecified: Secondary | ICD-10-CM | POA: Diagnosis present

## 2019-03-05 DIAGNOSIS — Z955 Presence of coronary angioplasty implant and graft: Secondary | ICD-10-CM | POA: Insufficient documentation

## 2019-03-05 DIAGNOSIS — F418 Other specified anxiety disorders: Secondary | ICD-10-CM | POA: Diagnosis present

## 2019-03-05 DIAGNOSIS — Z8349 Family history of other endocrine, nutritional and metabolic diseases: Secondary | ICD-10-CM | POA: Insufficient documentation

## 2019-03-05 DIAGNOSIS — Z7982 Long term (current) use of aspirin: Secondary | ICD-10-CM | POA: Insufficient documentation

## 2019-03-05 LAB — SARS CORONAVIRUS 2 (TAT 6-24 HRS): SARS Coronavirus 2: NEGATIVE

## 2019-03-05 LAB — CBC WITH DIFFERENTIAL/PLATELET
Abs Immature Granulocytes: 0.01 10*3/uL (ref 0.00–0.07)
Basophils Absolute: 0 10*3/uL (ref 0.0–0.1)
Basophils Relative: 1 %
Eosinophils Absolute: 0.1 10*3/uL (ref 0.0–0.5)
Eosinophils Relative: 2 %
HCT: 38.6 % (ref 36.0–46.0)
Hemoglobin: 11.7 g/dL — ABNORMAL LOW (ref 12.0–15.0)
Immature Granulocytes: 0 %
Lymphocytes Relative: 13 %
Lymphs Abs: 0.7 10*3/uL (ref 0.7–4.0)
MCH: 26.4 pg (ref 26.0–34.0)
MCHC: 30.3 g/dL (ref 30.0–36.0)
MCV: 86.9 fL (ref 80.0–100.0)
Monocytes Absolute: 0.4 10*3/uL (ref 0.1–1.0)
Monocytes Relative: 7 %
Neutro Abs: 4.1 10*3/uL (ref 1.7–7.7)
Neutrophils Relative %: 77 %
Platelets: 219 10*3/uL (ref 150–400)
RBC: 4.44 MIL/uL (ref 3.87–5.11)
RDW: 14.5 % (ref 11.5–15.5)
WBC: 5.3 10*3/uL (ref 4.0–10.5)
nRBC: 0 % (ref 0.0–0.2)

## 2019-03-05 LAB — COMPREHENSIVE METABOLIC PANEL
ALT: 20 U/L (ref 0–44)
AST: 26 U/L (ref 15–41)
Albumin: 3 g/dL — ABNORMAL LOW (ref 3.5–5.0)
Alkaline Phosphatase: 102 U/L (ref 38–126)
Anion gap: 9 (ref 5–15)
BUN: 13 mg/dL (ref 8–23)
CO2: 29 mmol/L (ref 22–32)
Calcium: 8.6 mg/dL — ABNORMAL LOW (ref 8.9–10.3)
Chloride: 105 mmol/L (ref 98–111)
Creatinine, Ser: 0.77 mg/dL (ref 0.44–1.00)
GFR calc Af Amer: >60 mL/min (ref >60)
GFR calc non Af Amer: >60 mL/min (ref >60)
Glucose, Bld: 105 mg/dL — ABNORMAL HIGH (ref 70–99)
Potassium: 4 mmol/L (ref 3.5–5.1)
Sodium: 143 mmol/L (ref 135–145)
Total Bilirubin: 0.5 mg/dL (ref 0.3–1.2)
Total Protein: 6.5 g/dL (ref 6.5–8.1)

## 2019-03-05 LAB — D-DIMER, QUANTITATIVE: D-Dimer, Quant: 1.22 ug/mL-FEU — ABNORMAL HIGH (ref 0.00–0.50)

## 2019-03-05 LAB — C-REACTIVE PROTEIN: CRP: 3.7 mg/dL — ABNORMAL HIGH (ref <1.0)

## 2019-03-05 LAB — BRAIN NATRIURETIC PEPTIDE: B Natriuretic Peptide: 346 pg/mL — ABNORMAL HIGH (ref 0.0–100.0)

## 2019-03-05 LAB — PROCALCITONIN: Procalcitonin: <0.1 ng/mL

## 2019-03-05 LAB — FERRITIN: Ferritin: 28 ng/mL (ref 11–307)

## 2019-03-05 LAB — HIV ANTIBODY (ROUTINE TESTING W REFLEX): HIV Screen 4th Generation wRfx: NONREACTIVE

## 2019-03-05 LAB — TROPONIN I (HIGH SENSITIVITY)
Troponin I (High Sensitivity): 2 ng/L (ref <18)
Troponin I (High Sensitivity): <2 ng/L (ref <18)

## 2019-03-05 LAB — CREATININE, SERUM
Creatinine, Ser: 0.7 mg/dL (ref 0.44–1.00)
GFR calc Af Amer: >60 mL/min (ref >60)
GFR calc non Af Amer: >60 mL/min (ref >60)

## 2019-03-05 LAB — POC SARS CORONAVIRUS 2 AG -  ED: SARS Coronavirus 2 Ag: NEGATIVE

## 2019-03-05 MED ORDER — ONDANSETRON HCL 4 MG/2ML IJ SOLN
4.0000 mg | Freq: Once | INTRAMUSCULAR | Status: AC
  Administered 2019-03-05: 4 mg via INTRAVENOUS
  Filled 2019-03-05: qty 2

## 2019-03-05 MED ORDER — LORATADINE 10 MG PO TABS
10.0000 mg | ORAL_TABLET | Freq: Every day | ORAL | Status: DC
  Administered 2019-03-05 – 2019-03-06 (×2): 10 mg via ORAL
  Filled 2019-03-05 (×2): qty 1

## 2019-03-05 MED ORDER — SODIUM CHLORIDE 0.9 % IV SOLN
INTRAVENOUS | Status: DC
  Administered 2019-03-05: 1000 mL via INTRAVENOUS

## 2019-03-05 MED ORDER — ASPIRIN 81 MG PO CHEW
81.0000 mg | CHEWABLE_TABLET | Freq: Every day | ORAL | Status: DC
  Administered 2019-03-06: 81 mg via ORAL
  Filled 2019-03-05: qty 1

## 2019-03-05 MED ORDER — ESCITALOPRAM OXALATE 10 MG PO TABS
10.0000 mg | ORAL_TABLET | Freq: Every day | ORAL | Status: DC
  Administered 2019-03-05 – 2019-03-06 (×2): 10 mg via ORAL
  Filled 2019-03-05 (×2): qty 1

## 2019-03-05 MED ORDER — CLOPIDOGREL BISULFATE 75 MG PO TABS
75.0000 mg | ORAL_TABLET | Freq: Every day | ORAL | Status: DC
  Administered 2019-03-05 – 2019-03-06 (×2): 75 mg via ORAL
  Filled 2019-03-05 (×2): qty 1

## 2019-03-05 MED ORDER — METOPROLOL TARTRATE 25 MG PO TABS
25.0000 mg | ORAL_TABLET | Freq: Two times a day (BID) | ORAL | Status: DC
  Administered 2019-03-05 – 2019-03-06 (×2): 25 mg via ORAL
  Filled 2019-03-05 (×2): qty 1

## 2019-03-05 MED ORDER — HYDROMORPHONE HCL 1 MG/ML IJ SOLN
0.5000 mg | Freq: Once | INTRAMUSCULAR | Status: AC
  Administered 2019-03-05: 0.5 mg via INTRAVENOUS
  Filled 2019-03-05: qty 1

## 2019-03-05 MED ORDER — ENOXAPARIN SODIUM 40 MG/0.4ML ~~LOC~~ SOLN
40.0000 mg | SUBCUTANEOUS | Status: DC
  Administered 2019-03-05: 40 mg via SUBCUTANEOUS
  Filled 2019-03-05: qty 0.4

## 2019-03-05 MED ORDER — POTASSIUM CHLORIDE CRYS ER 20 MEQ PO TBCR
20.0000 meq | EXTENDED_RELEASE_TABLET | Freq: Every day | ORAL | Status: DC
  Administered 2019-03-06: 20 meq via ORAL
  Filled 2019-03-05: qty 1

## 2019-03-05 MED ORDER — ONDANSETRON HCL 4 MG/2ML IJ SOLN: 4.0000 mg | Freq: Four times a day (QID) | INTRAMUSCULAR | Status: DC | PRN

## 2019-03-05 MED ORDER — ONDANSETRON HCL 4 MG PO TABS: 4.0000 mg | ORAL_TABLET | Freq: Four times a day (QID) | ORAL | Status: DC | PRN

## 2019-03-05 MED ORDER — HYDROXYZINE HCL 25 MG PO TABS
25.0000 mg | ORAL_TABLET | Freq: Four times a day (QID) | ORAL | Status: DC | PRN
  Administered 2019-03-05 – 2019-03-06 (×2): 25 mg via ORAL
  Filled 2019-03-05 (×2): qty 1

## 2019-03-05 MED ORDER — ACETAMINOPHEN 500 MG PO TABS
500.0000 mg | ORAL_TABLET | Freq: Four times a day (QID) | ORAL | Status: DC | PRN
  Administered 2019-03-06: 500 mg via ORAL
  Filled 2019-03-05: qty 1

## 2019-03-05 MED ORDER — LEVOFLOXACIN 750 MG PO TABS
750.0000 mg | ORAL_TABLET | Freq: Once | ORAL | Status: AC
  Administered 2019-03-05: 750 mg via ORAL
  Filled 2019-03-05: qty 1

## 2019-03-05 MED ORDER — FUROSEMIDE 10 MG/ML IJ SOLN
20.0000 mg | Freq: Two times a day (BID) | INTRAMUSCULAR | Status: DC
  Administered 2019-03-05 – 2019-03-06 (×2): 20 mg via INTRAVENOUS
  Filled 2019-03-05 (×2): qty 2

## 2019-03-05 MED ORDER — HYDROXYCHLOROQUINE SULFATE 200 MG PO TABS
200.0000 mg | ORAL_TABLET | Freq: Two times a day (BID) | ORAL | Status: DC
  Administered 2019-03-05 – 2019-03-06 (×2): 200 mg via ORAL
  Filled 2019-03-05 (×2): qty 1

## 2019-03-05 MED ORDER — NITROGLYCERIN 0.4 MG SL SUBL: 0.4000 mg | SUBLINGUAL_TABLET | SUBLINGUAL | Status: DC | PRN

## 2019-03-05 MED ORDER — IOHEXOL 350 MG/ML SOLN
100.0000 mL | Freq: Once | INTRAVENOUS | Status: AC | PRN
  Administered 2019-03-05: 100 mL via INTRAVENOUS

## 2019-03-05 MED ORDER — ROSUVASTATIN CALCIUM 20 MG PO TABS
20.0000 mg | ORAL_TABLET | Freq: Every day | ORAL | Status: DC
  Administered 2019-03-05 – 2019-03-06 (×2): 20 mg via ORAL
  Filled 2019-03-05 (×2): qty 1

## 2019-03-05 MED ORDER — CLONAZEPAM 0.5 MG PO TABS
0.5000 mg | ORAL_TABLET | Freq: Two times a day (BID) | ORAL | Status: DC | PRN
  Administered 2019-03-05: 1 mg via ORAL
  Administered 2019-03-06: 0.5 mg via ORAL
  Filled 2019-03-05: qty 2
  Filled 2019-03-05: qty 1

## 2019-03-05 NOTE — Progress Notes (Signed)
Pt requesting to speak w/ round MD in am about her neck pain, states she fell and believes she had a concussion ~1 week ago and would like to have it looked at. Neuro intact. Rolled up towel to use as neck pillow w/ relief.

## 2019-03-05 NOTE — ED Triage Notes (Signed)
Pt arrives via EMS from home with substernal CP radiating to her back that woke her this morning. Pt took home ASA 324, 1 SL nitro with no change in pain. Given 10mg  morphine PTA with no change in pain. Pt awake, alert, appropriate, NAD at present. Rating pain 9/10. VSS.

## 2019-03-05 NOTE — H&P (Signed)
History and Physical    Meghan Schwartz AGT:[id number] DOB: 01/25/1951 DOA: 03/05/2019  Referring MD/NP/PA: EDP PCP:  Patient coming from: Home  Chief Complaint: Chest pain  HPI: Meghan Schwartz is a 69 y.o. female with medical history significant of CAD, history of RCA stent in 01/2018, chronic diastolic CHF, rheumatoid arthritis, recent meniscal surgery on her left knee, was in her usual state of health yesterday, woke up this morning with pain across her anterior chest bilaterally, she reports this felt like an ache, was worse with breathing, she took a sublingual nitroglycerin but this did not help, she called 911, EMS arrived and gave her IV morphine which helped to a small extent. -On further evaluation she was noted to have 1+ edema, patient reports longstanding edema, she takes Lasix 3 times a week for diastolic CHF.  She denies orthopnea ED Course: Vital signs were stable, later in the course of her ED stay she was noted to have mild hypoxia with O2 sats drifting to the high 80s, improving with 2 L O2, labs were unremarkable except for mildly elevated BNP EKG without acute ST-T wave changes, COVID-19 PCR is pending, chest x-ray was clear, CTA chest noted asymmetric atelectasis or developing infiltrate in the right lower lobe  Review of Systems: As per HPI otherwise 14 point review of systems negative.   Past Medical History:  Diagnosis Date  . Allergic rhinitis   . Anxiety   . Benzodiazepine dependence (HCC)   . Bruxism   . CAD (coronary artery disease)    S/p NSTEMI 01/2018 >> LHC: oLAD 20, pLAD 20; oD1 20, oLCx 40, pRCA 99 >> PCI: DES to prox RCA // Echo 01/2018: EF 50-55; prob inf-lat and inf HK  . Chronic diastolic heart failure    Echocardiogram 07/2018: EF 55-60, grade 1 diastolic dysfunction, normal wall motion, normal GLS (-22.3), PASP 28  . Chronic foot pain    bunions, torn ligaments-on chronic pain medication  . CTS (carpal tunnel syndrome) 06/08/2014  . Depression   . High  cholesterol   . Hypertension   . MDD (major depressive disorder) 10/02/2017  . Migraine   . Myocardial infarction (HCC) 02/01/2018  . Pneumonia 1986   left lung  . RA (rheumatoid arthritis) (HCC)     Past Surgical History:  Procedure Laterality Date  . BTL  2000  . CARPAL TUNNEL RELEASE Left   . CESAREAN SECTION  1978. 1983  . CORONARY STENT INTERVENTION N/A 01/27/2018   Procedure: CORONARY STENT INTERVENTION;  Surgeon: Kathleene Hazel, MD;  Location: [facility];  Service: Cardiovascular;  Laterality: N/A;  . eye implants    . FOOT SURGERY Left 2008  . front teeth removed after injury    . KNEE ARTHROSCOPY WITH LATERAL MENISECTOMY Left 02/02/2019   Procedure: LEFT KNEE ARTHROSCOPY, MEDIAL AND LATERAL MENISECTOMY, EXCISION OF LEFT LATERAL MENISCAL CYST;  Surgeon: Valeria Batman, MD;  Location: [facility];  Service: Orthopedics;  Laterality: Left;  . LEFT HEART CATH AND CORONARY ANGIOGRAPHY N/A 01/27/2018   Procedure: LEFT HEART CATH AND CORONARY ANGIOGRAPHY;  Surgeon: Kathleene Hazel, MD;  Location: [facility];  Service: Cardiovascular;  Laterality: N/A;  . pre cancerous lesion removed from forehead    . TOTAL ABDOMINAL HYSTERECTOMY W/ BILATERAL SALPINGOOPHORECTOMY  2002   Dr. Jackelyn Knife  . VAGINAL HYSTERECTOMY  2001     reports that she has been smoking cigarettes. She has smoked for the past 20.00 years. She has never  used smokeless tobacco. She reports that she does not drink alcohol or use drugs.  Allergies  Allergen Reactions  . Erythromycin Nausea Only and Nausea And Vomiting    Family History  Problem Relation Age of Onset  . Thyroid disease Mother   . Breast cancer Mother   . Parkinson's disease Father   . Heart attack Paternal Grandfather   . Stroke Paternal Grandmother   . Arthritis Maternal Grandmother   . Heart attack Maternal Grandmother   . Raynaud syndrome Maternal Aunt      Prior to Admission medications   Medication Sig  Start Date End Date Taking? Authorizing Provider  aspirin 81 MG chewable tablet Chew 1 tablet (81 mg total) by mouth daily. 01/28/18   Rai, Vernelle Emerald, MD  cholecalciferol (VITAMIN D3) 25 MCG (1000 UT) tablet Take 1,000 Units by mouth daily.    [provider]  clonazePAM (KLONOPIN) 1 MG tablet Take 0.5-1 mg by mouth See admin instructions. 0.5 mg midday, 1 mg at bedtime 12/31/17   [provider]  cloNIDine (CATAPRES) 0.1 MG tablet Take 0.1 mg by mouth 2 (two) times daily. Up to three daily as needed     [provider]  clopidogrel (PLAVIX) 75 MG tablet Take 75 mg by mouth daily.    [provider]  Cranberry 500 MG TABS Take 500 mg by mouth daily.    [provider]  escitalopram (LEXAPRO) 10 MG tablet Take 15 mg by mouth daily.    [provider]  estradiol (VIVELLE-DOT) 0.075 MG/24HR Place 1 patch onto the skin 2 (two) times a week. Tuesday and Friday    [provider]  fexofenadine (ALLEGRA) 180 MG tablet Take 180 mg by mouth daily.    [provider]  furosemide (LASIX) 40 MG tablet Take 1.5 tablets (60 mg total) by mouth 3 (three) times a week. 12/09/18 03/09/19  Richardson Dopp T, PA-C  hydroxychloroquine (PLAQUENIL) 200 MG tablet Take 200 mg by mouth 2 (two) times daily.     [provider]  hydrOXYzine (ATARAX/VISTARIL) 25 MG tablet Take 1 tablet (25 mg total) by mouth every 6 (six) hours as needed for anxiety. 10/05/17   Starkes-Perry, Gayland Curry, FNP  metoprolol tartrate (LOPRESSOR) 25 MG tablet Take 1 tablet (25 mg total) by mouth daily. Patient taking differently: Take 25 mg by mouth 2 (two) times daily.  01/20/19   Fay Records, MD  Multiple Vitamins-Minerals (MULTIVITAMIN WITH MINERALS) tablet Take 1 tablet by mouth daily.    [provider]  nitroGLYCERIN (NITROSTAT) 0.4 MG SL tablet Place 1 tablet (0.4 mg total) under the tongue every 5 (five) minutes as needed for chest pain. 01/28/18   Rai, Ripudeep  K, MD  potassium chloride SA (KLOR-CON) 20 MEQ tablet Take 20 mEq by mouth 3 (three) times a week. Takes with Lasix    [provider]  rosuvastatin (CRESTOR) 20 MG tablet TAKE 1 TABLET(20 MG) BY MOUTH DAILY Patient taking differently: Take 20 mg by mouth daily.  01/20/19   Weaver, Scott T, PA-C  ZUBSOLV 5.7-1.4 MG SUBL Place 1 tablet under the tongue 3 (three) times daily. 12/30/18   [provider]    Physical Exam: Vitals:   03/05/19 1015 03/05/19 1016 03/05/19 1130 03/05/19 1538  BP:  114/63 103/62 (!) 110/59  Pulse:  80  88  Resp:  18 13 11   Temp:  99.3 F (37.4 C)    TempSrc:  Oral    SpO2:  100%  93%  Weight: 67.1 kg     Height: 5\' 5"  (1.651 m)         Constitutional: Pleasant ill-appearing female, laying in bed, AAOx3 Vitals:   03/05/19 1015 03/05/19 1016 03/05/19 1130 03/05/19 1538  BP:  114/63 103/62 (!) 110/59  Pulse:  80  88  Resp:  18 13 11   Temp:  99.3 F (37.4 C)    TempSrc:  Oral    SpO2:  100%  93%  Weight: 67.1 kg     Height: 5\' 5"  (1.651 m)      HEENT: Pupils equal reactive, oral mucosa moist Respiratory: Few bibasilar Rales Cardiovascular: Regular rate and rhythm, no murmurs / rubs / gallops Abdomen: soft, non tender, Bowel sounds positive.  Musculoskeletal: No joint deformity upper and lower extremities. Ext: 1+ edema chronic skin changes Skin: Dry scaly skin.  Neurologic: Moves all extremities, no localizing signs Psychiatric: Flat affect  Labs on Admission: I have personally reviewed following labs and imaging studies  CBC: Recent Labs  Lab 03/05/19 1025  WBC 5.3  NEUTROABS 4.1  HGB 11.7*  HCT 38.6  MCV 86.9  PLT 219   Basic Metabolic Panel: Recent Labs  Lab 03/05/19 1025  NA 143  K 4.0  CL 105  CO2 29  GLUCOSE 105*  BUN 13  CREATININE 0.77  CALCIUM 8.6*   GFR: Estimated Creatinine Clearance: 59.7 mL/min (by C-G formula based on SCr of 0.77 mg/dL). Liver Function Tests: Recent Labs  Lab 03/05/19 1025    AST 26  ALT 20  ALKPHOS 102  BILITOT 0.5  PROT 6.5  ALBUMIN 3.0*   No results for input(s): LIPASE, AMYLASE in the last 168 hours. No results for input(s): AMMONIA in the last 168 hours. Coagulation Profile: No results for input(s): INR, PROTIME in the last 168 hours. Cardiac Enzymes: No results for input(s): CKTOTAL, CKMB, CKMBINDEX, TROPONINI in the last 168 hours. BNP (last 3 results) Recent Labs    08/11/18 1135 08/20/18 1641 11/13/18 1652  PROBNP 454* 384* 309*   HbA1C: No results for input(s): HGBA1C in the last 72 hours. CBG: No results for input(s): GLUCAP in the last 168 hours. Lipid Profile: No results for input(s): CHOL, HDL, LDLCALC, TRIG, CHOLHDL, LDLDIRECT in the last 72 hours. Thyroid Function Tests: No results for input(s): TSH, T4TOTAL, FREET4, T3FREE, THYROIDAB in the last 72 hours. Anemia Panel: No results for input(s): VITAMINB12, FOLATE, FERRITIN, TIBC, IRON, RETICCTPCT in the last 72 hours. Urine analysis: No results found for: COLORURINE, APPEARANCEUR, LABSPEC, PHURINE, GLUCOSEU, HGBUR, BILIRUBINUR, KETONESUR, PROTEINUR, UROBILINOGEN, NITRITE, LEUKOCYTESUR Sepsis Labs: @LABRCNTIP (procalcitonin:4,lacticidven:4) )No results found for this or any previous visit (from the past 240 hour(s)).   Radiological Exams on Admission: DG Chest Port 1 View  Result Date: 03/05/2019 CLINICAL DATA:  Pt arrives via EMS from home with substernal CP radiating to her back that woke her this morning. Pt hx CAD, CHF. Pt is a smoker EXAM: PORTABLE CHEST - 1 VIEW COMPARISON:  none FINDINGS: Lungs are clear. Heart size and mediastinal contours are within normal limits. No effusion. Visualized bones unremarkable. IMPRESSION: No acute cardiopulmonary disease. Electronically Signed   By: 08/22/18 M.D.   On: 03/05/2019 10:54   CT Angio Chest/Abd/Pel for Dissection W and/or W/WO  Result Date: 03/05/2019 CLINICAL DATA:  69 year old female with substernal chest pain radiating into  her back which awoke her from sleep. Suspect aortic dissection. EXAM: CT ANGIOGRAPHY CHEST, ABDOMEN AND PELVIS TECHNIQUE: Multidetector CT imaging through the  chest, abdomen and pelvis was performed using the standard protocol during bolus administration of intravenous contrast. Multiplanar reconstructed images and MIPs were obtained and reviewed to evaluate the vascular anatomy. CONTRAST:  OMNIPAQUE IOHEXOL 350 MG/ML SOLN COMPARISON:  None. FINDINGS: CTA CHEST FINDINGS Cardiovascular: Conventional 3 vessel arch anatomy. Mild heterogeneous atherosclerotic plaque present at the origin of the arch vessels and at the aortic isthmus. No evidence of high attenuation material within the media on the initial noncontrast enhanced images. Following administration of intravenous contrast there is excellent opacification of the aortic lumen. No evidence of irregularity, dissection or ulceration. The aorta is normal in caliber. The main and central pulmonary arteries are normal in caliber. Scattered atherosclerotic calcifications are present along the left anterior descending, right and left coronary arteries. The heart is normal in size. No pericardial effusion. Mediastinum/Nodes: Unremarkable CT appearance of the thyroid gland. No suspicious mediastinal or hilar adenopathy. No soft tissue mediastinal mass. The thoracic esophagus is unremarkable. Lungs/Pleura: 0.5 mm subpleural lymph node affiliated with the minor fissure (image 32 series 6). Mild dependent atelectasis bilaterally slightly greater in the right lower lobe than the left. No pneumothorax, pleural effusion or pulmonary edema. Musculoskeletal: No acute fracture or aggressive appearing lytic or blastic osseous lesion. Review of the MIP images confirms the above findings. CTA ABDOMEN AND PELVIS FINDINGS VASCULAR Aorta: Normal caliber abdominal aorta. No evidence of dissection or irregular atherosclerotic plaque. Mild scattered heterogeneous atherosclerotic  plaque is present. Celiac: Patent without evidence of aneurysm, dissection, vasculitis or significant stenosis. SMA: Patent without evidence of aneurysm, dissection, vasculitis or significant stenosis. Renals: Solitary renal arteries bilaterally. Mild heterogeneous atherosclerotic plaque results in perhaps mild stenosis at the origin of both renal arteries. There is a tiny accessory artery to the right upper pole. IMA: Patent without evidence of aneurysm, dissection, vasculitis or significant stenosis. Inflow: Patent without evidence of aneurysm, dissection, vasculitis or significant stenosis. Veins: No focal venous abnormality. Review of the MIP images confirms the above findings. NON-VASCULAR Hepatobiliary: Normal hepatic contour and morphology. Calcified stones layer within the gallbladder lumen consistent with cholelithiasis. No evidence of biliary ductal dilatation. Pancreas: Unremarkable. No pancreatic ductal dilatation or surrounding inflammatory changes. Spleen: Normal in size without focal abnormality. Adrenals/Urinary Tract: Normal adrenal glands. No hydronephrosis, nephrolithiasis or enhancing renal mass. A 1.4 cm circumscribed low-attenuation lesion in the lower pole of the right kidney is consistent with a simple cyst. The ureters and bladder are unremarkable. Stomach/Bowel: No evidence of obstruction or focal bowel wall thickening. Normal appendix in the right lower quadrant. The terminal ileum is unremarkable. Lymphatic: No suspicious lymphadenopathy. Reproductive: Surgical changes of prior hysterectomy. No adnexal mass. Other: No abdominal wall hernia or abnormality. No abdominopelvic ascites. Musculoskeletal: No acute fracture or aggressive appearing lytic or blastic osseous lesion. Multilevel degenerative disc disease most significant at L4-L5 and L5-S1. Review of the MIP images confirms the above findings. IMPRESSION: CTA CHEST 1. No evidence of acute aortic dissection, aneurysm or other acute  arterial abnormality. 2. Asymmetric atelectasis versus small volume infiltrate in the posterior right lower lobe. This could represent early developing pneumonia, or small volume aspiration if the patient suffers from gastroesophageal reflux disease. 3. Mild scattered atherosclerotic plaque along the thoracic and abdominal aorta. Aortic Atherosclerosis (ICD10-170.0). 4. Coronary artery calcifications. CTA ABD/PELVIS 1. No acute aortic or other arterial abnormality. 2. Cholelithiasis without secondary findings to suggest acute cholecystitis. 3. Multilevel degenerative disc disease. 4. Small simple cyst in the lower pole of the right kidney. Electronically Signed  By: Malachy Moan M.D.   On: 03/05/2019 13:36    EKG: Independently reviewed.  Sinus rhythm, low voltage QRS complexes, no acute ST-T wave changes  Assessment/Plan  Chest pain Mild hypoxemia -Etiology is not clear at this time -Differentials are CHF versus Covid, on exam appears mildly fluid overloaded with bibasilar Rales, 1+ edema, this could potentially be contributing to her symptoms -EKG without any acute ST-T wave changes, high-sensitivity troponin negative x2, no symptoms of ACS -CTA chest negative for PE or dissection, right lower lobe atelectasis versus early infiltrate suspected -Temp is 99.3, with mild hypoxemia, could be early Covid infection as well -Check CRP, ferritin, D-dimer, procalcitonin -COVID-19 PCR is pending -Will try 2 doses of IV Lasix today, await COVID-19 PCR -Wean O2 as tolerated  CAD -History of non-STEMI in 01/2018 with drug-eluting stent to RCA -Continue aspirin, Plavix, beta-blocker, statin  Mild acute on chronic chronic diastolic CHF -Last echo in June/2020 with preserved EF and diastolic dysfunction -IV Lasix today as discussed above  History of recent left knee arthroscopy -Meniscectomy and excision of left lateral meniscal cyst -operated by Dr. Cleophas Dunker on 12/8 -CTA negative for  PE  DVT prophylaxis: Lovenox Code Status: Full code by default, she wants to think about this further Family Communication: No family at bedside, discussed with patient Disposition Plan: Home in 1 to 2 days if improved Consults called: None Admission status: Observation  Zannie Cove MD [facility]  03/05/2019, 4:32 PM

## 2019-03-05 NOTE — ED Notes (Signed)
Report called to David RN.

## 2019-03-05 NOTE — ED Notes (Signed)
ED TO INPATIENT HANDOFF REPORT  ED Nurse Name and Phone #: Brant Peets RN [phone]  S Name/Age/Gender Meghan Schwartz 69 y.o. female Room/Bed: 043C/043C  Code Status   Code Status: Prior  Home/SNF/Other Home Patient oriented to: self, place, time and situation Is this baseline? Yes   Triage Complete: Triage complete  Chief Complaint Chest pain [R07.9]  Triage Note Pt arrives via EMS from home with substernal CP radiating to her back that woke her this morning. Pt took home ASA 324, 1 SL nitro with no change in pain. Given 10mg  morphine PTA with no change in pain. Pt awake, alert, appropriate, NAD at present. Rating pain 9/10. VSS.     Allergies Allergies  Allergen Reactions  . Erythromycin Nausea Only and Nausea And Vomiting    Level of Care/Admitting Diagnosis ED Disposition    ED Disposition Condition Comment   Admit  Hospital Area: [facility] [[id number]]  Level of Care: Telemetry Medical [104]  I expect the patient will be discharged within 24 hours: No (not a candidate for 5C-Observation unit)  Diagnosis: Chest pain [facility]  Admitting Physician: [[id number]] [[id number]]  Attending Physician: Zannie Cove [[id number]]  PT Class (Do Not Modify): Observation [104]  PT Acc Code (Do Not Modify): Observation [10022]       B Medical/Surgery History Past Medical History:  Diagnosis Date  . Allergic rhinitis   . Anxiety   . Benzodiazepine dependence (HCC)   . Bruxism   . CAD (coronary artery disease)    S/p NSTEMI 01/2018 >> LHC: oLAD 20, pLAD 20; oD1 20, oLCx 40, pRCA 99 >> PCI: DES to prox RCA // Echo 01/2018: EF 50-55; prob inf-lat and inf HK  . Chronic diastolic heart failure    Echocardiogram 07/2018: EF 55-60, grade 1 diastolic dysfunction, normal wall motion, normal GLS (-22.3), PASP 28  . Chronic foot pain    bunions, torn ligaments-on chronic pain medication  . CTS (carpal tunnel syndrome) 06/08/2014  . Depression   . High cholesterol   . Hypertension    . MDD (major depressive disorder) 10/02/2017  . Migraine   . Myocardial infarction (HCC) 02/01/2018  . Pneumonia 1986   left lung  . RA (rheumatoid arthritis) (HCC)    Past Surgical History:  Procedure Laterality Date  . BTL  2000  . CARPAL TUNNEL RELEASE Left   . CESAREAN SECTION  1978. 1983  . CORONARY STENT INTERVENTION N/A 01/27/2018   Procedure: CORONARY STENT INTERVENTION;  Surgeon: 14/04/2017, MD;  Location: [facility] INVASIVE CV LAB;  Service: Cardiovascular;  Laterality: N/A;  . eye implants    . FOOT SURGERY Left 2008  . front teeth removed after injury    . KNEE ARTHROSCOPY WITH LATERAL MENISECTOMY Left 02/02/2019   Procedure: LEFT KNEE ARTHROSCOPY, MEDIAL AND LATERAL MENISECTOMY, EXCISION OF LEFT LATERAL MENISCAL CYST;  Surgeon: 14/09/2018, MD;  Location: [facility] ORS;  Service: Orthopedics;  Laterality: Left;  . LEFT HEART CATH AND CORONARY ANGIOGRAPHY N/A 01/27/2018   Procedure: LEFT HEART CATH AND CORONARY ANGIOGRAPHY;  Surgeon: 14/04/2017, MD;  Location: [facility] INVASIVE CV LAB;  Service: Cardiovascular;  Laterality: N/A;  . pre cancerous lesion removed from forehead    . TOTAL ABDOMINAL HYSTERECTOMY W/ BILATERAL SALPINGOOPHORECTOMY  2002   Dr. 2003  . VAGINAL HYSTERECTOMY  2001     A IV Location/Drains/Wounds Patient Lines/Drains/Airways Status   Active Line/Drains/Airways    Name:   Placement date:   Placement time:  Site:   Days:   Peripheral IV 03/05/19 Left Antecubital   03/05/19    --    Antecubital   less than 1   Incision (Closed) 02/02/19 Knee Left   02/02/19    1209     31          Intake/Output Last 24 hours  Intake/Output Summary (Last 24 hours) at 03/05/2019 1727 Last data filed at 03/05/2019 1300 Gross per 24 hour  Intake 1000 ml  Output --  Net 1000 ml    Labs/Imaging Results for orders placed or performed during the hospital encounter of 03/05/19 (from the past 48 hour(s))  Brain natriuretic peptide     Status:  Abnormal   Collection Time: 03/05/19 10:25 AM  Result Value Ref Range   B Natriuretic Peptide 346.0 (H) 0.0 - 100.0 pg/mL    Comment: Performed at [facility], [address] [place], [place] [address]  CBC with Differential     Status: Abnormal   Collection Time: 03/05/19 10:25 AM  Result Value Ref Range   WBC 5.3 4.0 - 10.5 K/uL   RBC 4.44 3.87 - 5.11 MIL/uL   Hemoglobin 11.7 (L) 12.0 - 15.0 g/dL   HCT 13.1 43.8 - 88.7 %   MCV 86.9 80.0 - 100.0 fL   MCH 26.4 26.0 - 34.0 pg   MCHC 30.3 30.0 - 36.0 g/dL   RDW 57.9 72.8 - 20.6 %   Platelets 219 150 - 400 K/uL   nRBC 0.0 0.0 - 0.2 %   Neutrophils Relative % 77 %   Neutro Abs 4.1 1.7 - 7.7 K/uL   Lymphocytes Relative 13 %   Lymphs Abs 0.7 0.7 - 4.0 K/uL   Monocytes Relative 7 %   Monocytes Absolute 0.4 0.1 - 1.0 K/uL   Eosinophils Relative 2 %   Eosinophils Absolute 0.1 0.0 - 0.5 K/uL   Basophils Relative 1 %   Basophils Absolute 0.0 0.0 - 0.1 K/uL   Immature Granulocytes 0 %   Abs Immature Granulocytes 0.01 0.00 - 0.07 K/uL    Comment: Performed at [facility], [address], [place], [place] [address]  Comprehensive metabolic panel     Status: Abnormal   Collection Time: 03/05/19 10:25 AM  Result Value Ref Range   Sodium 143 135 - 145 mmol/L   Potassium 4.0 3.5 - 5.1 mmol/L   Chloride 105 98 - 111 mmol/L   CO2 29 22 - 32 mmol/L   Glucose, Bld 105 (H) 70 - 99 mg/dL   BUN 13 8 - 23 mg/dL   Creatinine, Ser 5.37 0.44 - 1.00 mg/dL   Calcium 8.6 (L) 8.9 - 10.3 mg/dL   Total Protein 6.5 6.5 - 8.1 g/dL   Albumin 3.0 (L) 3.5 - 5.0 g/dL   AST 26 15 - 41 U/L   ALT 20 0 - 44 U/L   Alkaline Phosphatase 102 38 - 126 U/L   Total Bilirubin 0.5 0.3 - 1.2 mg/dL   GFR calc non Af Amer >60 >60 mL/min   GFR calc Af Amer >60 >60 mL/min   Anion gap 9 5 - 15    Comment: Performed at [facility], [address] [place], [place] [address]  Troponin I (High Sensitivity)     Status: None   Collection Time: 03/05/19  10:25 AM  Result Value Ref Range   Troponin I (High Sensitivity) 2 <18 ng/L    Comment: (NOTE) Elevated high sensitivity  troponin I (hsTnI) values and significant  changes across serial measurements may suggest ACS but many other  chronic and acute conditions are known to elevate hsTnI results.  Refer to the "Links" section for chest pain algorithms and additional  guidance. Performed at [facility], [address], [place], [place] [address]   Troponin I (High Sensitivity)     Status: None   Collection Time: 03/05/19  2:10 PM  Result Value Ref Range   Troponin I (High Sensitivity) <2 <18 ng/L    Comment: (NOTE) Elevated high sensitivity troponin I (hsTnI) values and significant  changes across serial measurements may suggest ACS but many other  chronic and acute conditions are known to elevate hsTnI results.  Refer to the Links section for chest pain algorithms and additional  guidance. Performed at [facility], [address] [place], [place] [address]   D-dimer, quantitative (not at [facility])     Status: Abnormal   Collection Time: 03/05/19  4:29 PM  Result Value Ref Range   D-Dimer, Quant 1.22 (H) 0.00 - 0.50 ug/mL-FEU    Comment: (NOTE) At the manufacturer cut-off of 0.50 ug/mL FEU, this assay has been documented to exclude PE with a sensitivity and negative predictive value of 97 to 99%.  At this time, this assay has not been approved by the FDA to exclude DVT/VTE. Results should be correlated with clinical presentation. Performed at [facility], [address], [place], [place] [address]    DG Chest Port 1 View  Result Date: 03/05/2019 CLINICAL DATA:  Pt arrives via EMS from home with substernal CP radiating to her back that woke her this morning. Pt hx CAD, CHF. Pt is a smoker EXAM: PORTABLE CHEST - 1 VIEW COMPARISON:  none FINDINGS: Lungs are clear. Heart size and mediastinal contours are within normal limits. No effusion. Visualized bones  unremarkable. IMPRESSION: No acute cardiopulmonary disease. Electronically Signed   By: Corlis Leak M.D.   On: 03/05/2019 10:54   CT Angio Chest/Abd/Pel for Dissection W and/or W/WO  Result Date: 03/05/2019 CLINICAL DATA:  69 year old female with substernal chest pain radiating into her back which awoke her from sleep. Suspect aortic dissection. EXAM: CT ANGIOGRAPHY CHEST, ABDOMEN AND PELVIS TECHNIQUE: Multidetector CT imaging through the chest, abdomen and pelvis was performed using the standard protocol during bolus administration of intravenous contrast. Multiplanar reconstructed images and MIPs were obtained and reviewed to evaluate the vascular anatomy. CONTRAST:  OMNIPAQUE IOHEXOL 350 MG/ML SOLN COMPARISON:  None. FINDINGS: CTA CHEST FINDINGS Cardiovascular: Conventional 3 vessel arch anatomy. Mild heterogeneous atherosclerotic plaque present at the origin of the arch vessels and at the aortic isthmus. No evidence of high attenuation material within the media on the initial noncontrast enhanced images. Following administration of intravenous contrast there is excellent opacification of the aortic lumen. No evidence of irregularity, dissection or ulceration. The aorta is normal in caliber. The main and central pulmonary arteries are normal in caliber. Scattered atherosclerotic calcifications are present along the left anterior descending, right and left coronary arteries. The heart is normal in size. No pericardial effusion. Mediastinum/Nodes: Unremarkable CT appearance of the thyroid gland. No suspicious mediastinal or hilar adenopathy. No soft tissue mediastinal mass. The thoracic esophagus is unremarkable. Lungs/Pleura: 0.5 mm subpleural lymph node affiliated with the minor fissure (image 32 series 6). Mild dependent atelectasis bilaterally slightly greater in the right lower lobe than the left. No pneumothorax, pleural effusion or pulmonary edema. Musculoskeletal: No acute fracture or  aggressive  appearing lytic or blastic osseous lesion. Review of the MIP images confirms the above findings. CTA ABDOMEN AND PELVIS FINDINGS VASCULAR Aorta: Normal caliber abdominal aorta. No evidence of dissection or irregular atherosclerotic plaque. Mild scattered heterogeneous atherosclerotic plaque is present. Celiac: Patent without evidence of aneurysm, dissection, vasculitis or significant stenosis. SMA: Patent without evidence of aneurysm, dissection, vasculitis or significant stenosis. Renals: Solitary renal arteries bilaterally. Mild heterogeneous atherosclerotic plaque results in perhaps mild stenosis at the origin of both renal arteries. There is a tiny accessory artery to the right upper pole. IMA: Patent without evidence of aneurysm, dissection, vasculitis or significant stenosis. Inflow: Patent without evidence of aneurysm, dissection, vasculitis or significant stenosis. Veins: No focal venous abnormality. Review of the MIP images confirms the above findings. NON-VASCULAR Hepatobiliary: Normal hepatic contour and morphology. Calcified stones layer within the gallbladder lumen consistent with cholelithiasis. No evidence of biliary ductal dilatation. Pancreas: Unremarkable. No pancreatic ductal dilatation or surrounding inflammatory changes. Spleen: Normal in size without focal abnormality. Adrenals/Urinary Tract: Normal adrenal glands. No hydronephrosis, nephrolithiasis or enhancing renal mass. A 1.4 cm circumscribed low-attenuation lesion in the lower pole of the right kidney is consistent with a simple cyst. The ureters and bladder are unremarkable. Stomach/Bowel: No evidence of obstruction or focal bowel wall thickening. Normal appendix in the right lower quadrant. The terminal ileum is unremarkable. Lymphatic: No suspicious lymphadenopathy. Reproductive: Surgical changes of prior hysterectomy. No adnexal mass. Other: No abdominal wall hernia or abnormality. No abdominopelvic ascites. Musculoskeletal: No acute  fracture or aggressive appearing lytic or blastic osseous lesion. Multilevel degenerative disc disease most significant at L4-L5 and L5-S1. Review of the MIP images confirms the above findings. IMPRESSION: CTA CHEST 1. No evidence of acute aortic dissection, aneurysm or other acute arterial abnormality. 2. Asymmetric atelectasis versus small volume infiltrate in the posterior right lower lobe. This could represent early developing pneumonia, or small volume aspiration if the patient suffers from gastroesophageal reflux disease. 3. Mild scattered atherosclerotic plaque along the thoracic and abdominal aorta. Aortic Atherosclerosis (ICD10-170.0). 4. Coronary artery calcifications. CTA ABD/PELVIS 1. No acute aortic or other arterial abnormality. 2. Cholelithiasis without secondary findings to suggest acute cholecystitis. 3. Multilevel degenerative disc disease. 4. Small simple cyst in the lower pole of the right kidney. Electronically Signed   By: Jacqulynn Cadet M.D.   On: 03/05/2019 13:36    Pending Labs Unresulted Labs (From admission, onward)    Start     Ordered   03/05/19 1715  Procalcitonin - Baseline  Add-on,   AD     03/05/19 1714   03/05/19 1629  C-reactive protein  ONCE - STAT,   STAT     03/05/19 1629   03/05/19 1629  Ferritin  ONCE - STAT,   STAT     03/05/19 1629   03/05/19 1543  SARS CORONAVIRUS 2 (TAT 6-24 HRS) Nasopharyngeal Nasopharyngeal Swab  (Tier 3 (TAT 6-24 hrs))  Once,   STAT    Question Answer Comment  Is this test for diagnosis or screening Diagnosis of ill patient   Symptomatic for COVID-19 as defined by CDC Yes   Date of Symptom Onset 03/05/2019   Hospitalized for COVID-19 Yes   Admitted to ICU for COVID-19 No   Previously tested for COVID-19 Yes   Resident in a congregate (group) care setting No   Employed in healthcare setting No   Pregnant No      03/05/19 1543   Signed and Held  HIV Antibody (routine testing w  rflx)  (HIV Antibody (Routine testing w reflex)  panel)  Once,   R     Signed and Held   Signed and Held  CBC  (enoxaparin (LOVENOX)    CrCl >/= 30 ml/min)  Once,   R    Comments: Baseline for enoxaparin therapy IF NOT ALREADY DRAWN.  Notify MD if PLT < 100 K.    Signed and Held   Signed and Held  Creatinine, serum  (enoxaparin (LOVENOX)    CrCl >/= 30 ml/min)  Once,   R    Comments: Baseline for enoxaparin therapy IF NOT ALREADY DRAWN.    Signed and Held   Signed and Held  Creatinine, serum  (enoxaparin (LOVENOX)    CrCl >/= 30 ml/min)  Weekly,   R    Comments: while on enoxaparin therapy    Signed and Held   Signed and Held  CBC  Tomorrow morning,   R     Signed and Held   Signed and Held  Comprehensive metabolic panel  Tomorrow morning,   R     Signed and Held          Vitals/Pain Today's Vitals   03/05/19 1016 03/05/19 1130 03/05/19 1140 03/05/19 1538  BP: 114/63 103/62  (!) 110/59  Pulse: 80   88  Resp: 18 13  11   Temp: 99.3 F (37.4 C)     TempSrc: Oral     SpO2: 100%   93%  Weight:      Height:      PainSc:   8      Isolation Precautions Airborne and Contact precautions  Medications Medications  furosemide (LASIX) injection 20 mg (has no administration in time range)  potassium chloride SA (KLOR-CON) CR tablet 20 mEq (has no administration in time range)  ondansetron (ZOFRAN) injection 4 mg (4 mg Intravenous Given 03/05/19 1144)  HYDROmorphone (DILAUDID) injection 0.5 mg (0.5 mg Intravenous Given 03/05/19 1145)  iohexol (OMNIPAQUE) 350 MG/ML injection 100 mL (100 mLs Intravenous Contrast Given 03/05/19 1308)  levofloxacin (LEVAQUIN) tablet 750 mg (750 mg Oral Given 03/05/19 1547)    Mobility walks with person assist     Focused Assessments Cardiac Assessment Handoff:  Cardiac Rhythm: Normal sinus rhythm Lab Results  Component Value Date   TROPONINI 0.10 (HH) 01/26/2018   Lab Results  Component Value Date   DDIMER 1.22 (H) 03/05/2019   Does the Patient currently have chest pain? No  , Pulmonary  Assessment Handoff:  Lung sounds:   O2 Device: Room Air O2 Flow Rate (L/min): 2 L/min      R Recommendations: See Admitting Provider Note  Report given to:   Additional Notes:

## 2019-03-05 NOTE — ED Notes (Signed)
Pt splinting and shallow breathing, spo2 drops to 70s when she is shallow breathing. A gentle reminder to breathe deeper and pt spo2 returns to the upper 90s. Pt given levaquin and swabbed for covid 19. Pt denies any shortness of breath and states she only is breathing shallow to prevent any pain. MD aware of pts desat episodes. Will continue to monitor.

## 2019-03-05 NOTE — ED Provider Notes (Signed)
[facility] EMERGENCY DEPARTMENT Provider Note   CSN: [id number] Arrival date & time: 03/05/19  1010     History Chief Complaint  Patient presents with  . Chest Pain    Meghan Schwartz is a 69 y.o. female.  Patient brought in by EMS.  Patient with onset of substernal chest pain this morning.  Radiating to her back and into both shoulders.  It woke her up.  Patient took aspirin 1 sublingual nitro no change in pain.  EMS gave her 10 mg of morphine no change in pain.  Patient arrived alert and awake and appeared to be in no acute distress but was rating the pain 9 out of 10.  Patient's primary care doctor is Dr. Hyman Hopes from [place].  Patient's past medical history is significant for hypertension chronic diastolic heart failure.  Coronary artery disease.  Status post non-STEMI 2019.  Did have a stent placed to the proximal RCA.  Patient states she felt fine yesterday.  She was out running errands.  This was a sudden thing this morning.  Never anything like that happen before.        Past Medical History:  Diagnosis Date  . Allergic rhinitis   . Anxiety   . Benzodiazepine dependence (HCC)   . Bruxism   . CAD (coronary artery disease)    S/p NSTEMI 01/2018 >> LHC: oLAD 20, pLAD 20; oD1 20, oLCx 40, pRCA 99 >> PCI: DES to prox RCA // Echo 01/2018: EF 50-55; prob inf-lat and inf HK  . Chronic diastolic heart failure    Echocardiogram 07/2018: EF 55-60, grade 1 diastolic dysfunction, normal wall motion, normal GLS (-22.3), PASP 28  . Chronic foot pain    bunions, torn ligaments-on chronic pain medication  . CTS (carpal tunnel syndrome) 06/08/2014  . Depression   . High cholesterol   . Hypertension   . MDD (major depressive disorder) 10/02/2017  . Migraine   . Myocardial infarction (HCC) 02/01/2018  . Pneumonia 1986   left lung  . RA (rheumatoid arthritis) [facility])     Patient Active Problem List   Diagnosis Date Noted  . Derangement of posterior horn of medial meniscus  02/02/2019  . Derangement of posterior horn of lateral meniscus 02/02/2019  . Meniscal cyst, unspecified laterality 12/01/2018  . Torn medial meniscus 12/01/2018  . Primary osteoarthritis of left knee 12/01/2018  . Coronary artery disease involving native coronary artery of native heart without angina pectoris 09/14/2018  . Chronic diastolic (congestive) heart failure (HCC)   . Chronic pain of left knee 05/15/2018  . Mass of left knee 05/15/2018  . Non-ST elevation (NSTEMI) myocardial infarction (HCC)   . Chest pain 01/25/2018  . RA (rheumatoid arthritis) (HCC) 01/25/2018  . Hyperlipidemia 01/25/2018  . Hypokalemia 01/25/2018  . Depression with anxiety 01/25/2018  . Tobacco abuse 01/25/2018  . Lower extremity cellulitis 01/25/2018  . Benzodiazepine dependence (HCC)   . MDD (major depressive disorder), recurrent severe, without psychosis (HCC) 10/02/2017  . CTS (carpal tunnel syndrome) 06/08/2014    Past Surgical History:  Procedure Laterality Date  . BTL  2000  . CARPAL TUNNEL RELEASE Left   . CESAREAN SECTION  1978. 1983  . CORONARY STENT INTERVENTION N/A 01/27/2018   Procedure: CORONARY STENT INTERVENTION;  Surgeon: Kathleene Hazel, MD;  Location: [facility] INVASIVE CV LAB;  Service: Cardiovascular;  Laterality: N/A;  . eye implants    . FOOT SURGERY Left 2008  . front teeth removed after injury    .  KNEE ARTHROSCOPY WITH LATERAL MENISECTOMY Left 02/02/2019   Procedure: LEFT KNEE ARTHROSCOPY, MEDIAL AND LATERAL MENISECTOMY, EXCISION OF LEFT LATERAL MENISCAL CYST;  Surgeon: Garald Balding, MD;  Location: [facility] ORS;  Service: Orthopedics;  Laterality: Left;  . LEFT HEART CATH AND CORONARY ANGIOGRAPHY N/A 01/27/2018   Procedure: LEFT HEART CATH AND CORONARY ANGIOGRAPHY;  Surgeon: Burnell Blanks, MD;  Location: [place] CV LAB;  Service: Cardiovascular;  Laterality: N/A;  . pre cancerous lesion removed from forehead    . TOTAL ABDOMINAL HYSTERECTOMY W/ BILATERAL  SALPINGOOPHORECTOMY  2002   Dr. Willis Modena  . VAGINAL HYSTERECTOMY  2001     OB History   No obstetric history on file.     Family History  Problem Relation Age of Onset  . Thyroid disease Mother   . Breast cancer Mother   . Parkinson's disease Father   . Heart attack Paternal Grandfather   . Stroke Paternal Grandmother   . Arthritis Maternal Grandmother   . Heart attack Maternal Grandmother   . Raynaud syndrome Maternal Aunt     Social History   Tobacco Use  . Smoking status: Current Every Day Smoker    Years: 20.00    Types: Cigarettes    Last attempt to quit: 02/25/2010    Years since quitting: 9.0  . Smokeless tobacco: Never Used  . Tobacco comment: Restarted smoking earlier this year. smokes during stressful time   Substance Use Topics  . Alcohol use: No    Alcohol/week: 0.0 standard drinks  . Drug use: No    Home Medications Prior to Admission medications   Medication Sig Start Date End Date Taking? Authorizing Provider  aspirin 81 MG chewable tablet Chew 1 tablet (81 mg total) by mouth daily. 01/28/18   Rai, Vernelle Emerald, MD  cholecalciferol (VITAMIN D3) 25 MCG (1000 UT) tablet Take 1,000 Units by mouth daily.    [provider]  clonazePAM (KLONOPIN) 1 MG tablet Take 0.5-1 mg by mouth See admin instructions. 0.5 mg midday, 1 mg at bedtime 12/31/17   [provider]  cloNIDine (CATAPRES) 0.1 MG tablet Take 0.1 mg by mouth 2 (two) times daily. Up to three daily as needed     [provider]  clopidogrel (PLAVIX) 75 MG tablet Take 75 mg by mouth daily.    [provider]  Cranberry 500 MG TABS Take 500 mg by mouth daily.    [provider]  escitalopram (LEXAPRO) 10 MG tablet Take 15 mg by mouth daily.    [provider]  estradiol (VIVELLE-DOT) 0.075 MG/24HR Place 1 patch onto the skin 2 (two) times a week. Tuesday and Friday    [provider]  fexofenadine (ALLEGRA) 180 MG tablet Take 180 mg by mouth  daily.    [provider]  furosemide (LASIX) 40 MG tablet Take 1.5 tablets (60 mg total) by mouth 3 (three) times a week. 12/09/18 03/09/19  Richardson Dopp T, PA-C  hydroxychloroquine (PLAQUENIL) 200 MG tablet Take 200 mg by mouth 2 (two) times daily.     [provider]  hydrOXYzine (ATARAX/VISTARIL) 25 MG tablet Take 1 tablet (25 mg total) by mouth every 6 (six) hours as needed for anxiety. 10/05/17   Starkes-Perry, Gayland Curry, FNP  metoprolol tartrate (LOPRESSOR) 25 MG tablet Take 1 tablet (25 mg total) by mouth daily. Patient taking differently: Take 25 mg by mouth 2 (two) times daily.  01/20/19   Fay Records, MD  Multiple Vitamins-Minerals (MULTIVITAMIN WITH MINERALS)  tablet Take 1 tablet by mouth daily.    [provider]  nitroGLYCERIN (NITROSTAT) 0.4 MG SL tablet Place 1 tablet (0.4 mg total) under the tongue every 5 (five) minutes as needed for chest pain. 01/28/18   Rai, Ripudeep K, MD  potassium chloride SA (KLOR-CON) 20 MEQ tablet Take 20 mEq by mouth 3 (three) times a week. Takes with Lasix    [provider]  rosuvastatin (CRESTOR) 20 MG tablet TAKE 1 TABLET(20 MG) BY MOUTH DAILY Patient taking differently: Take 20 mg by mouth daily.  01/20/19   Weaver, Alethia Melendrez T, PA-C  ZUBSOLV 5.7-1.4 MG SUBL Place 1 tablet under the tongue 3 (three) times daily. 12/30/18   [provider]    Allergies    Erythromycin  Review of Systems   Review of Systems  Constitutional: Negative for chills and fever.  HENT: Negative for congestion, rhinorrhea and sore throat.   Eyes: Negative for visual disturbance.  Respiratory: Negative for cough and shortness of breath.   Cardiovascular: Positive for chest pain. Negative for leg swelling.  Gastrointestinal: Negative for abdominal pain, diarrhea, nausea and vomiting.  Genitourinary: Negative for dysuria.  Musculoskeletal: Negative for back pain and neck pain.  Skin: Negative for rash.  Neurological: Negative for  dizziness, light-headedness and headaches.  Hematological: Does not bruise/bleed easily.  Psychiatric/Behavioral: Negative for confusion.    Physical Exam Updated Vital Signs BP (!) 110/59   Pulse 88   Temp 99.3 F (37.4 C) (Oral)   Resp 11   Ht 1.651 m ( )   Wt 67.1 kg   SpO2 93%   BMI 24.63 kg/m   Physical Exam Vitals and nursing note reviewed.  Constitutional:      General: She is not in acute distress.    Appearance: Normal appearance. She is well-developed. She is not ill-appearing.  HENT:     Head: Normocephalic and atraumatic.  Eyes:     Extraocular Movements: Extraocular movements intact.     Conjunctiva/sclera: Conjunctivae normal.     Pupils: Pupils are equal, round, and reactive to light.  Cardiovascular:     Rate and Rhythm: Normal rate and regular rhythm.     Heart sounds: No murmur.  Pulmonary:     Effort: Pulmonary effort is normal. No respiratory distress.     Breath sounds: Normal breath sounds.  Chest:     Chest wall: No tenderness.  Abdominal:     Palpations: Abdomen is soft.     Tenderness: There is no abdominal tenderness.  Musculoskeletal:        General: Normal range of motion.     Cervical back: Normal range of motion and neck supple.     Right lower leg: No edema.     Left lower leg: No edema.  Skin:    General: Skin is warm and dry.     Capillary Refill: Capillary refill takes less than 2 seconds.  Neurological:     General: No focal deficit present.     Mental Status: She is alert and oriented to person, place, and time.     ED Results / Procedures / Treatments   Labs (all labs ordered are listed, but only abnormal results are displayed) Labs Reviewed  BRAIN NATRIURETIC PEPTIDE - Abnormal; Notable for the following components:      Result Value   B Natriuretic Peptide 346.0 (*)    All other components within normal limits  CBC WITH DIFFERENTIAL/PLATELET - Abnormal; Notable for the following components:  Hemoglobin 11.7 (*)     All other components within normal limits  COMPREHENSIVE METABOLIC PANEL - Abnormal; Notable for the following components:   Glucose, Bld 105 (*)    Calcium 8.6 (*)    Albumin 3.0 (*)    All other components within normal limits  SARS CORONAVIRUS 2 (TAT 6-24 HRS)  C-REACTIVE PROTEIN  FERRITIN  D-DIMER, QUANTITATIVE (NOT AT [facility])  TROPONIN I (HIGH SENSITIVITY)  TROPONIN I (HIGH SENSITIVITY)    EKG EKG Interpretation  Date/Time:  Friday March 05 2019 10:14:06 EST Ventricular Rate:  82 PR Interval:    QRS Duration: 86 QT Interval:  411 QTC Calculation: 480 R Axis:   43 Text Interpretation: Sinus rhythm Low voltage, precordial leads Artifact Baseline wander Confirmed by Vanetta Mulders [phone]) on 03/05/2019 10:29:55 AM   Radiology DG Chest Port 1 View  Result Date: 03/05/2019 CLINICAL DATA:  Pt arrives via EMS from home with substernal CP radiating to her back that woke her this morning. Pt hx CAD, CHF. Pt is a smoker EXAM: PORTABLE CHEST - 1 VIEW COMPARISON:  none FINDINGS: Lungs are clear. Heart size and mediastinal contours are within normal limits. No effusion. Visualized bones unremarkable. IMPRESSION: No acute cardiopulmonary disease. Electronically Signed   By: Corlis Leak M.D.   On: 03/05/2019 10:54   CT Angio Chest/Abd/Pel for Dissection W and/or W/WO  Result Date: 03/05/2019 CLINICAL DATA:  69 year old female with substernal chest pain radiating into her back which awoke her from sleep. Suspect aortic dissection. EXAM: CT ANGIOGRAPHY CHEST, ABDOMEN AND PELVIS TECHNIQUE: Multidetector CT imaging through the chest, abdomen and pelvis was performed using the standard protocol during bolus administration of intravenous contrast. Multiplanar reconstructed images and MIPs were obtained and reviewed to evaluate the vascular anatomy. CONTRAST:  OMNIPAQUE IOHEXOL 350 MG/ML SOLN COMPARISON:  None. FINDINGS: CTA CHEST FINDINGS Cardiovascular: Conventional 3 vessel arch anatomy.  Mild heterogeneous atherosclerotic plaque present at the origin of the arch vessels and at the aortic isthmus. No evidence of high attenuation material within the media on the initial noncontrast enhanced images. Following administration of intravenous contrast there is excellent opacification of the aortic lumen. No evidence of irregularity, dissection or ulceration. The aorta is normal in caliber. The main and central pulmonary arteries are normal in caliber. Scattered atherosclerotic calcifications are present along the left anterior descending, right and left coronary arteries. The heart is normal in size. No pericardial effusion. Mediastinum/Nodes: Unremarkable CT appearance of the thyroid gland. No suspicious mediastinal or hilar adenopathy. No soft tissue mediastinal mass. The thoracic esophagus is unremarkable. Lungs/Pleura: 0.5 mm subpleural lymph node affiliated with the minor fissure (image 32 series 6). Mild dependent atelectasis bilaterally slightly greater in the right lower lobe than the left. No pneumothorax, pleural effusion or pulmonary edema. Musculoskeletal: No acute fracture or aggressive appearing lytic or blastic osseous lesion. Review of the MIP images confirms the above findings. CTA ABDOMEN AND PELVIS FINDINGS VASCULAR Aorta: Normal caliber abdominal aorta. No evidence of dissection or irregular atherosclerotic plaque. Mild scattered heterogeneous atherosclerotic plaque is present. Celiac: Patent without evidence of aneurysm, dissection, vasculitis or significant stenosis. SMA: Patent without evidence of aneurysm, dissection, vasculitis or significant stenosis. Renals: Solitary renal arteries bilaterally. Mild heterogeneous atherosclerotic plaque results in perhaps mild stenosis at the origin of both renal arteries. There is a tiny accessory artery to the right upper pole. IMA: Patent without evidence of aneurysm, dissection, vasculitis or significant stenosis. Inflow: Patent without  evidence of aneurysm, dissection, vasculitis or significant  stenosis. Veins: No focal venous abnormality. Review of the MIP images confirms the above findings. NON-VASCULAR Hepatobiliary: Normal hepatic contour and morphology. Calcified stones layer within the gallbladder lumen consistent with cholelithiasis. No evidence of biliary ductal dilatation. Pancreas: Unremarkable. No pancreatic ductal dilatation or surrounding inflammatory changes. Spleen: Normal in size without focal abnormality. Adrenals/Urinary Tract: Normal adrenal glands. No hydronephrosis, nephrolithiasis or enhancing renal mass. A 1.4 cm circumscribed low-attenuation lesion in the lower pole of the right kidney is consistent with a simple cyst. The ureters and bladder are unremarkable. Stomach/Bowel: No evidence of obstruction or focal bowel wall thickening. Normal appendix in the right lower quadrant. The terminal ileum is unremarkable. Lymphatic: No suspicious lymphadenopathy. Reproductive: Surgical changes of prior hysterectomy. No adnexal mass. Other: No abdominal wall hernia or abnormality. No abdominopelvic ascites. Musculoskeletal: No acute fracture or aggressive appearing lytic or blastic osseous lesion. Multilevel degenerative disc disease most significant at L4-L5 and L5-S1. Review of the MIP images confirms the above findings. IMPRESSION: CTA CHEST 1. No evidence of acute aortic dissection, aneurysm or other acute arterial abnormality. 2. Asymmetric atelectasis versus small volume infiltrate in the posterior right lower lobe. This could represent early developing pneumonia, or small volume aspiration if the patient suffers from gastroesophageal reflux disease. 3. Mild scattered atherosclerotic plaque along the thoracic and abdominal aorta. Aortic Atherosclerosis (ICD10-170.0). 4. Coronary artery calcifications. CTA ABD/PELVIS 1. No acute aortic or other arterial abnormality. 2. Cholelithiasis without secondary findings to suggest acute  cholecystitis. 3. Multilevel degenerative disc disease. 4. Small simple cyst in the lower pole of the right kidney. Electronically Signed   By: Malachy Moan M.D.   On: 03/05/2019 13:36    Procedures Procedures (including critical care time)  Medications Ordered in ED Medications  ondansetron (ZOFRAN) injection 4 mg (4 mg Intravenous Given 03/05/19 1144)  HYDROmorphone (DILAUDID) injection 0.5 mg (0.5 mg Intravenous Given 03/05/19 1145)  iohexol (OMNIPAQUE) 350 MG/ML injection 100 mL (100 mLs Intravenous Contrast Given 03/05/19 1308)  levofloxacin (LEVAQUIN) tablet 750 mg (750 mg Oral Given 03/05/19 1547)    ED Course  I have reviewed the triage vital signs and the nursing notes.  Pertinent labs & imaging results that were available during my care of the patient were reviewed by me and considered in my medical decision making (see chart for details).    MDM Rules/Calculators/A&P                     Patient is pain was pretty significant 9 out of 10 but she looked quite comfortable.  We did notice early on scheming on 2 L of oxygen not on oxygen at home she was satting 100%.  Turn her oxygen off for observation early on that sats went down to like 91%.  But mostly were around 93%.  Put her back on 1 L of oxygen just while we did the work-up.  Based on the severity of the pain main concern was potentially for a dissection so patient had CT angio chest and abdomen.  Which was negative.  Also did not show any signs of PE.  But it did pick up a posterior infiltrate.  Patient's troponins x2 were both 2 or less.  BNP was not significantly elevated 346.  No leukocytosis.  Patient had stated she felt fine yesterday.  During her period of time here we noticed that she got more hypoxic.  And off oxygen she would drop her oxygen sats all way down to 76 and then we  get her to go in and say feeling short of breath she said no get her to deep breathing it would go up to like 94% very quickly but when she would  rest it would drop down into the 80s or the upper 70s.  Patient not normally on oxygen at home.  Patient given oral Levaquin for going home and was planning to also give her tramadol for going home but the whole hypoxia thing is prohibited her to go home.  Clinically it may be that she is coming down with COVID-19 infection.  Or there could be a blossoming pneumonia.  Discussed with hospitalist they will admit for observation.  Will order a point-of-care Covid test.  Final Clinical Impression(s) / ED Diagnoses Final diagnoses:  Precordial pain  Hypoxia    Rx / DC Orders ED Discharge Orders    None       Vanetta Mulders, MD 03/05/19 1637

## 2019-03-05 NOTE — Discharge Instructions (Addendum)
Work-up today for the chest pain without any acute findings.  Cardiac heart markers were normal.  CT of chest and abdomen ruled out any dissection or blood clots in the lungs.  Make an appointment follow-up with your primary care doctor as well as your cardiology group.  Return for any new or worse symptoms.  Take the tramadol as needed for chest pain.  Also chest x-ray raise some concerns about an early infiltrate.  This could be related to Covid.  Outpatient Covid testing will be done.  Or this could be an early pneumonia.  Take the antibiotic Levaquin as directed.  Make an appointment to follow-up with your regular doctor.

## 2019-03-06 ENCOUNTER — Encounter (HOSPITAL_COMMUNITY): Payer: Self-pay | Admitting: Internal Medicine

## 2019-03-06 ENCOUNTER — Observation Stay (HOSPITAL_BASED_OUTPATIENT_CLINIC_OR_DEPARTMENT_OTHER): Payer: Medicare PPO

## 2019-03-06 DIAGNOSIS — R0902 Hypoxemia: Secondary | ICD-10-CM | POA: Diagnosis not present

## 2019-03-06 DIAGNOSIS — R079 Chest pain, unspecified: Secondary | ICD-10-CM | POA: Diagnosis not present

## 2019-03-06 LAB — CBC
HCT: 34.9 % — ABNORMAL LOW (ref 36.0–46.0)
Hemoglobin: 10.8 g/dL — ABNORMAL LOW (ref 12.0–15.0)
MCH: 26.3 pg (ref 26.0–34.0)
MCHC: 30.9 g/dL (ref 30.0–36.0)
MCV: 85.1 fL (ref 80.0–100.0)
Platelets: 188 10*3/uL (ref 150–400)
RBC: 4.1 MIL/uL (ref 3.87–5.11)
RDW: 14.6 % (ref 11.5–15.5)
WBC: 4.3 10*3/uL (ref 4.0–10.5)
nRBC: 0 % (ref 0.0–0.2)

## 2019-03-06 LAB — ECHOCARDIOGRAM COMPLETE
Height: 65 in
Weight: 2368 oz

## 2019-03-06 LAB — COMPREHENSIVE METABOLIC PANEL
ALT: 15 U/L (ref 0–44)
AST: 25 U/L (ref 15–41)
Albumin: 2.7 g/dL — ABNORMAL LOW (ref 3.5–5.0)
Alkaline Phosphatase: 94 U/L (ref 38–126)
Anion gap: 10 (ref 5–15)
BUN: 9 mg/dL (ref 8–23)
CO2: 27 mmol/L (ref 22–32)
Calcium: 8.5 mg/dL — ABNORMAL LOW (ref 8.9–10.3)
Chloride: 104 mmol/L (ref 98–111)
Creatinine, Ser: 0.65 mg/dL (ref 0.44–1.00)
GFR calc Af Amer: >60 mL/min (ref >60)
GFR calc non Af Amer: >60 mL/min (ref >60)
Glucose, Bld: 96 mg/dL (ref 70–99)
Potassium: 4.4 mmol/L (ref 3.5–5.1)
Sodium: 141 mmol/L (ref 135–145)
Total Bilirubin: 0.8 mg/dL (ref 0.3–1.2)
Total Protein: 5.5 g/dL — ABNORMAL LOW (ref 6.5–8.1)

## 2019-03-06 MED ORDER — TORSEMIDE 20 MG PO TABS: 20.0000 mg | ORAL_TABLET | Freq: Every day | ORAL | 0 refills | Status: DC

## 2019-03-06 NOTE — Plan of Care (Signed)
  Problem: Education: Goal: Ability to demonstrate management of disease process will improve Outcome: Completed/Met Goal: Ability to verbalize understanding of medication therapies will improve Outcome: Completed/Met Goal: Individualized Educational Video(s) Outcome: Completed/Met   Problem: Activity: Goal: Capacity to carry out activities will improve Outcome: Completed/Met   Problem: Cardiac: Goal: Ability to achieve and maintain adequate cardiopulmonary perfusion will improve Outcome: Completed/Met   

## 2019-03-06 NOTE — Progress Notes (Signed)
  Echocardiogram 2D Echocardiogram has been performed.  Domnick Chervenak A Edom Schmuhl 03/06/2019, 5:11 PM

## 2019-03-06 NOTE — Progress Notes (Signed)
SATURATION QUALIFICATIONS: (This note is used to comply with regulatory documentation for home oxygen)  Patient Saturations on Room Air at Rest = 86%   Patient Saturations on Room Air while Ambulating = 84%   Patient Saturations on 2 Liters of oxygen while Ambulating = 94%  Please briefly explain why patient needs home oxygen:patient needs oxygen to maintain adequate tissue oxygenation.

## 2019-03-06 NOTE — TOC Transition Note (Signed)
Transition of Care [facility]) - CM/SW Discharge Note   Patient Details  Name: Meghan Schwartz MRN: [id number] Date of Birth: 10-Jan-1951  [facility] CM/SW Contact:  Deveron Furlong, RN 03/06/2019, 3:48 PM   Clinical Narrative:    DME oxygen will be delivered to room prior to discharge.   Final next level of care: Home/Self Care Barriers to Discharge: No Barriers Identified    Discharge Plan and Services                DME Arranged: Oxygen DME Agency: [employer] Date DME Agency Contacted: 03/06/19 Time DME Agency Contacted: 1538 Representative spoke with at DME Agency: Ledell Noss

## 2019-03-06 NOTE — Discharge Summary (Signed)
Physician Discharge Summary  Patient ID: Meghan Schwartz MRN: [id number] DOB/AGE: 02/25/1951 69 y.o.  Admit date: 03/05/2019 Discharge date: 03/06/2019  Admission Diagnoses:  Discharge Diagnoses:  Principal Problem:   Hypoxia    -Respiratory failure with hypoxia, chronic versus acute on chronic (patient has history of rheumatoid arthritis)    -Acute on chronic diastolic CHF.  Active Problems:   MDD (major depressive disorder), recurrent severe, without psychosis ([place])   Chest pain   Depression with anxiety   Tobacco abuse   Chronic diastolic (congestive) heart failure (HCC)   Coronary artery disease involving native coronary artery of native heart without angina pectoris   Primary osteoarthritis of left knee   Discharged Condition: stable  Hospital Course: Patient is a 69 year old Caucasian female with past medical history significant for coronary artery disease, history of RCA stent in 5277, chronic diastolic CHF, rheumatoid arthritis on recent left knee meniscal surgery.  Patient also had recent fall at home with reported neck injury.  Patient presented with pain around the neck area.  Patient was noted to be hypoxic on presentation.  Cardiac BNP was minimally elevated on presentation.  Patient was admitted for further assessment and management.  D-dimer was elevated, however, CT angio of the chest was negative for pulmonary embolism.  Cardiac enzymes came back negative x2.  EKG revealed low voltage EKG.  Chest x-ray did not reveal any acute cardiopulmonary abnormalities.  Patient denied typical chest pain.  Apparently, patient was on Lasix 60 mg 3 times daily.  Patient was managed with IV Lasix during the hospital stay.  Leg edema has improved.  Patient will be discharged on torsemide 20 Mg p.o. once daily.  Primary care provider will kindly follow renal function and electrolytes.  Patient will also follow-up with cardiologist within the week.  Neck pain is controlled.  With ambulation,  patient's oxygen dropped to 84%.  Patient will be discharged on supplemental oxygen 2 L via nasal cannula on discharge.  Patient will follow with primary care provider within 1 week of discharge.  Patient is eager to be discharged back home.  Consults: None  Significant Diagnostic Studies:  Lab work: Troponins came back negative.  EKG reveals low voltage EKG.  Chest x-ray was negative for any acute cardiopulmonary disease.  D-dimer was elevated, however, CT of the chest did not reveal PE.  Treatments: Patient will be discharged back on home oxygen 2 L/min via nasal cannula.  Discharge Exam: Blood pressure 134/73, pulse 96, temperature 98 F (36.7 C), temperature source Oral, resp. rate 14, height 5\' 5"  (1.651 m), weight 67.1 kg, SpO2 95 %.  Disposition: Discharge disposition: 01-Home or Self Care  Discharge Instructions    Diet - low sodium heart healthy   Complete by: As directed    Increase activity slowly   Complete by: As directed      Allergies as of 03/06/2019      Reactions   Erythromycin Nausea Only, Nausea And Vomiting      Medication List    STOP taking these medications   cloNIDine 0.1 MG tablet Commonly known as: CATAPRES   Cranberry 500 MG Tabs   furosemide 40 MG tablet Commonly known as: LASIX     TAKE these medications   acetaminophen 500 MG tablet Commonly known as: TYLENOL Take 500 mg by mouth every 6 (six) hours as needed (For knee pain).   aspirin 81 MG chewable tablet Chew 1 tablet (81 mg total) by mouth daily.   cholecalciferol 25 MCG (  1000 UT) tablet Commonly known as: VITAMIN D3 Take 1,000 Units by mouth daily.   clonazePAM 1 MG tablet Commonly known as: KLONOPIN Take 0.5-1 mg by mouth See admin instructions. 0.5 mg midday, 1 mg at bedtime   clopidogrel 75 MG tablet Commonly known as: PLAVIX Take 75 mg by mouth daily.   escitalopram 10 MG tablet Commonly known as: LEXAPRO Take 10 mg by mouth daily.   estradiol 0.075 MG/24HR Commonly  known as: VIVELLE-DOT Place 1 patch onto the skin 2 (two) times a week. Tuesday and Friday   fexofenadine 180 MG tablet Commonly known as: ALLEGRA Take 180 mg by mouth daily.   hydroxychloroquine 200 MG tablet Commonly known as: PLAQUENIL Take 200 mg by mouth 2 (two) times daily.   hydrOXYzine 25 MG tablet Commonly known as: ATARAX/VISTARIL Take 1 tablet (25 mg total) by mouth every 6 (six) hours as needed for anxiety.   metoprolol tartrate 25 MG tablet Commonly known as: LOPRESSOR Take 1 tablet (25 mg total) by mouth daily. What changed: when to take this   multivitamin with minerals tablet Take 1 tablet by mouth daily.   nitroGLYCERIN 0.4 MG SL tablet Commonly known as: NITROSTAT Place 1 tablet (0.4 mg total) under the tongue every 5 (five) minutes as needed for chest pain.   potassium chloride SA 20 MEQ tablet Commonly known as: KLOR-CON Take 20 mEq by mouth 3 (three) times a week. Takes with Lasix   rosuvastatin 20 MG tablet Commonly known as: CRESTOR TAKE 1 TABLET(20 MG) BY MOUTH DAILY What changed: See the new instructions.   torsemide 20 MG tablet Commonly known as: DEMADEX Take 1 tablet (20 mg total) by mouth daily.   Zubsolv 5.7-1.4 MG Subl Generic drug: Buprenorphine HCl-Naloxone HCl Place 1 tablet under the tongue 3 (three) times daily.            Durable Medical Equipment  (From admission, onward)         Start     Ordered   03/06/19 1531  DME Oxygen  Once    Question Answer Comment  Length of Need 12 Months   Liters per Minute 2   Frequency Continuous (stationary and portable oxygen unit needed)   Oxygen delivery system Gas      03/06/19 1531         Follow-up Information    Schedule an appointment as soon as possible for a visit  with Shirlean Mylar, MD.   Specialty: Family Medicine Contact information: [address] [place] [address] [phone]        Schedule an appointment as soon as possible for a  visit  with Pricilla Riffle, MD.   Specialty: Cardiology Contact information: [address] [place] [address] [phone]           Signed: Barnetta Chapel 03/06/2019, 3:31 PM

## 2019-03-06 NOTE — Progress Notes (Signed)
Patient in a stable condition, discharge education reviewed with patient she verbalized understanding, iv removed, tele dc ccmd notified, home oxygen and patient belongings at bedside, patient to be transported home by her husband.

## 2019-03-08 ENCOUNTER — Telehealth: Payer: Self-pay | Admitting: Internal Medicine

## 2019-03-08 NOTE — Telephone Encounter (Signed)
Pt just discharged from [facility]   Adimtted with chest pressure and edema.  Meds adjusted She will need f/u in clinic to see how doing and to have labs checked.

## 2019-03-09 ENCOUNTER — Ambulatory Visit: Payer: Medicare PPO | Admitting: Physical Therapy

## 2019-03-10 NOTE — Telephone Encounter (Signed)
Pt scheduled with APP 03/12/19.

## 2019-03-11 ENCOUNTER — Ambulatory Visit: Payer: Medicare PPO | Admitting: Physician Assistant

## 2019-03-11 NOTE — Progress Notes (Signed)
Cardiology Office Note:    Date:  03/12/2019   ID:  Meghan Schwartz, DOB [birth year], MRN [id number]  PCP:  Meghan Mylar, MD  Cardiologist:  Meghan Pates, MD  Electrophysiologist:  None   Referring MD: Meghan Mylar, MD   Chief Complaint  Patient presents with  . Hospitalization Follow-up    admx with CP, CHF    History of Present Illness:    Meghan Schwartz is a 69 y.o. female with:   Coronary artery disease  ? S/p NSTEMI 01/2018 >> DES toproxRCA  Heart failure with preserved ejection fraction  ? Hx of Leg edema, shortness of breath, BNP mildy elevated; tx with low dose Lasix (twice weekly) ? Also has L knee lat meniscus tear and ganglion cyst (may contribute some to edema)  Rheumatoid Arthritis  Hyperlipidemia  L knee para meniscal cyst (Dr. Cleophas Schwartz)  Meghan Schwartz was last seen in clinic in October 2020.    She was admitted 1/8-1/9 with chest discomfort and hypoxic respiratory failure.  SARS-CoV-2 test was negative.  D-dimer was elevated.  CT angio was negative for pulmonary embolism.  Cardiac enzymes were normal.  BNP was somewhat elevated (346).  She was given IV Lasix with improved edema and discharged on torsemide.  She was discharged on home O2.secondary to hypoxia with ambulation.  She returns for follow up.  She is not wearing O2.  She has a pulse oximeter.  Her O2 sats have been 94-97 at home. She only wears the O2 when she sleeps.  She had not had further chest pain.  Her breathing is improved and her leg swelling remains improved.  She has not had orthopnea, paroxysmal nocturnal dyspnea.  Weights have been stable. She has occasional dizziness but no near syncope.   Prior CV studies:   The following studies were reviewed today:  Echocardiogram 03/06/2019 EF 60-65, normal wall motion, normal RV SF, trivial MR, aortic sclerosis without stenosis  Chest/abdominal/pelvic CTA 03/05/2019 IMPRESSION: CTA CHEST 1. No evidence of acute aortic dissection, aneurysm or other  acute arterial abnormality. 2. Asymmetric atelectasis versus small volume infiltrate in the posterior right lower lobe. This could represent early developing pneumonia, or small volume aspiration if the patient suffers from gastroesophageal reflux disease. 3. Mild scattered atherosclerotic plaque along the thoracic and abdominal aorta. Aortic Atherosclerosis (ICD10-170.0). 4. Coronary artery calcifications.  CTA ABD/PELVIS 1. No acute aortic or other arterial abnormality. 2. Cholelithiasis without secondary findings to suggest acute cholecystitis. 3. Multilevel degenerative disc disease. 4. Small simple cyst in the lower pole of the right kidney.  ABIs 01/07/2019  Normal bilaterally   Echocardiogram 08/20/2018 EF 55-60, Gr 1 DD, GLS -22.3, PASP 28  Cardiac Catheterization12/3/19 LAD ost 20, prox 20; D1 ost 20 LCx ost 40 RCA prox 99 PCI: 3.5 x 28 mm Synergy DES to proximal RCA  Echo 01/26/18 EF 50-55; prob inf-lat and inf HK  Past Medical History:  Diagnosis Date  . Allergic rhinitis   . Anxiety   . Benzodiazepine dependence (HCC)   . Bruxism   . CAD (coronary artery disease)    S/p NSTEMI 01/2018 >> LHC: oLAD 20, pLAD 20; oD1 20, oLCx 40, pRCA 99 >> PCI: DES to prox RCA // Echo 01/2018: EF 50-55; prob inf-lat and inf HK  . Chronic diastolic heart failure    Echocardiogram 07/2018: EF 55-60, grade 1 diastolic dysfunction, normal wall motion, normal GLS (-22.3), PASP 28  . Chronic foot pain    bunions, torn ligaments-on chronic pain medication  .  CTS (carpal tunnel syndrome) 06/08/2014  . Depression   . High cholesterol   . Hypertension   . MDD (major depressive disorder) 10/02/2017  . Migraine   . Myocardial infarction (HCC) 02/01/2018  . Pneumonia 1986   left lung  . RA (rheumatoid arthritis) (HCC)    Surgical Hx: The patient  has a past surgical history that includes Foot surgery (Left, 2008); Vaginal hysterectomy (2001); Cesarean section (1978. 1983); LEFT  HEART CATH AND CORONARY ANGIOGRAPHY (N/A, 01/27/2018); CORONARY STENT INTERVENTION (N/A, 01/27/2018); BTL (2000); Total abdominal hysterectomy w/ bilateral salpingoophorectomy (2002); Carpal tunnel release (Left); eye implants; front teeth removed after injury; pre cancerous lesion removed from forehead; and Knee arthroscopy with lateral menisectomy (Left, 02/02/2019).   Current Medications: Current Meds  Medication Sig  . acetaminophen (TYLENOL) 500 MG tablet Take 500 mg by mouth every 6 (six) hours as needed (For knee pain).  . cholecalciferol (VITAMIN D3) 25 MCG (1000 UT) tablet Take 1,000 Units by mouth daily.  . clonazePAM (KLONOPIN) 1 MG tablet Take 0.5-1 mg by mouth See admin instructions. 0.5 mg midday, 1 mg at bedtime  . clopidogrel (PLAVIX) 75 MG tablet Take 75 mg by mouth daily.  Marland Kitchen escitalopram (LEXAPRO) 10 MG tablet Take 10 mg by mouth daily.   Marland Kitchen estradiol (VIVELLE-DOT) 0.075 MG/24HR Place 1 patch onto the skin 2 (two) times a week. Tuesday and Friday  . fexofenadine (ALLEGRA) 180 MG tablet Take 180 mg by mouth daily.  . hydroxychloroquine (PLAQUENIL) 200 MG tablet Take 200 mg by mouth 2 (two) times daily.   . hydrOXYzine (ATARAX/VISTARIL) 25 MG tablet Take 1 tablet (25 mg total) by mouth every 6 (six) hours as needed for anxiety.  . metoprolol tartrate (LOPRESSOR) 25 MG tablet Take 1 tablet (25 mg total) by mouth daily. (Patient taking differently: Take 25 mg by mouth 2 (two) times daily. )  . Multiple Vitamins-Minerals (MULTIVITAMIN WITH MINERALS) tablet Take 1 tablet by mouth daily.  . nitroGLYCERIN (NITROSTAT) 0.4 MG SL tablet Place 1 tablet (0.4 mg total) under the tongue every 5 (five) minutes as needed for chest pain.  . potassium chloride SA (KLOR-CON) 20 MEQ tablet Take 20 mEq by mouth 3 (three) times a week. Takes with Lasix  . rosuvastatin (CRESTOR) 20 MG tablet TAKE 1 TABLET(20 MG) BY MOUTH DAILY (Patient taking differently: Take 20 mg by mouth daily. )  . torsemide (DEMADEX)  20 MG tablet Take 1 tablet (20 mg total) by mouth daily.  . ZUBSOLV 5.7-1.4 MG SUBL Place 1 tablet under the tongue 3 (three) times daily.  . [DISCONTINUED] aspirin 81 MG chewable tablet Chew 1 tablet (81 mg total) by mouth daily.     Allergies:   Erythromycin   Social History   Tobacco Use  . Smoking status: Former Smoker    Years: 20.00    Types: Cigarettes    Quit date: 02/25/2010    Years since quitting: 9.0  . Smokeless tobacco: Never Used  . Tobacco comment: Restarted smoking earlier this year. smokes during stressful time   Substance Use Topics  . Alcohol use: No    Alcohol/week: 0.0 standard drinks  . Drug use: No     Family Hx: The patient's family history includes Arthritis in her maternal grandmother; Breast cancer in her mother; Heart attack in her maternal grandmother and paternal grandfather; Parkinson's disease in her father; Raynaud syndrome in her maternal aunt; Stroke in her paternal grandmother; Thyroid disease in her mother.  ROS:   Please see  the history of present illness.    ROS All other systems reviewed and are negative.   EKGs/Labs/Other Test Reviewed:    EKG:  EKG is not  ordered today.  The ekg ordered today demonstrates n/a  Recent Labs: 08/11/2018: TSH 1.610 11/13/2018: NT-Pro BNP 309 03/05/2019: B Natriuretic Peptide 346.0 03/06/2019: ALT 15; BUN 9; Creatinine, Ser 0.65; Hemoglobin 10.8; Platelets 188; Potassium 4.4; Sodium 141   Recent Lipid Panel Lab Results  Component Value Date/Time   CHOL 122 08/11/2018 11:35 AM   TRIG 71 08/11/2018 11:35 AM   HDL 62 08/11/2018 11:35 AM   CHOLHDL 2.0 08/11/2018 11:35 AM   CHOLHDL 2.7 01/28/2018 03:53 AM   LDLCALC 46 08/11/2018 11:35 AM    Physical Exam:    VS:  BP 98/60   Pulse 74   Ht 5\' 5"  (1.651 m)   Wt 153 lb 12.8 oz (69.8 kg)   SpO2 92%   BMI 25.59 kg/m     Wt Readings from Last 3 Encounters:  03/12/19 153 lb 12.8 oz (69.8 kg)  03/05/19 148 lb (67.1 kg)  03/03/19 149 lb (67.6 kg)      Physical Exam  Constitutional: She is oriented to person, place, and time. She appears well-developed and well-nourished. No distress.  HENT:  Head: Normocephalic and atraumatic.  Eyes: No scleral icterus.  Neck: No thyromegaly present.  Cardiovascular: Normal rate, regular rhythm and normal heart sounds.  No murmur heard. Pulmonary/Chest: Effort normal and breath sounds normal. She has no rales.  Abdominal: Soft. There is no hepatomegaly.  Musculoskeletal:        General: Edema (trace pretibial; 1+ ankle (bilat)) present.  Lymphadenopathy:    She has no cervical adenopathy.  Neurological: She is alert and oriented to person, place, and time.  Skin: Skin is warm and dry.  Psychiatric: She has a normal mood and affect.    ASSESSMENT & PLAN:    1. Chronic diastolic (congestive) heart failure (HCC) EF normal by echocardiogram during recent admission.  Volume appears to be stable. She is NYHA 2.  I will ask Dr. 05/01/19 to review her echocardiogram to see if we should consider getting a PYP scan to rule out amyloid.  Continue current Rx.  Obtain repeat BMET today.  FU with Dr. Tenny Craw in 3 mos.   2. Coronary artery disease involving native coronary artery of native heart without angina pectoris Hx of NSTEMI in 01/2018 tx with DES to RCA.  She was recently admitted with chest pain in the setting of a/c diastolic CHF.  Her hs-Troponins were normal.  She has not had further chest pain.  No further testing is needed.  She is > 1 year out from her MI and no longer needs DAPT.  -DC ASA  -Continue Clopidogrel, Metoprolol, Rosuvastatin   3. Mixed hyperlipidemia LDL optimal on most recent lab work.  Continue current Rx.    4. Hypoxia Her low O2 may have all been related to CHF.  But, if she has more documented hypoxia, consider referral to Pulmonology.      Dispo:  Return in about 3 months (around 06/10/2019) for Routine Follow Up, w/ Dr. 06/12/2019, in person.   Medication Adjustments/Labs and Tests  Ordered: Current medicines are reviewed at length with the patient today.  Concerns regarding medicines are outlined above.  Tests Ordered: Orders Placed This Encounter  Procedures  . Basic Metabolic Panel (BMET)   Medication Changes: No orders of the defined types were placed in this encounter.  Signed, Richardson Dopp, PA-C  03/12/2019 1:11 PM    [facility] [address], [place], [place]  [address] Phone: [phone]; Fax: [phone]

## 2019-03-12 ENCOUNTER — Ambulatory Visit: Payer: Medicare PPO | Admitting: Physician Assistant

## 2019-03-12 ENCOUNTER — Encounter: Payer: Self-pay | Admitting: Physician Assistant

## 2019-03-12 ENCOUNTER — Other Ambulatory Visit: Payer: Self-pay

## 2019-03-12 VITALS — BP 98/60 | HR 74 | Ht 65.0 in | Wt 153.8 lb

## 2019-03-12 DIAGNOSIS — I5032 Chronic diastolic (congestive) heart failure: Secondary | ICD-10-CM

## 2019-03-12 DIAGNOSIS — I2510 Atherosclerotic heart disease of native coronary artery without angina pectoris: Secondary | ICD-10-CM | POA: Diagnosis not present

## 2019-03-12 DIAGNOSIS — R0902 Hypoxemia: Secondary | ICD-10-CM | POA: Diagnosis not present

## 2019-03-12 DIAGNOSIS — E782 Mixed hyperlipidemia: Secondary | ICD-10-CM

## 2019-03-12 NOTE — Patient Instructions (Addendum)
Medication Instructions:   Your physician has recommended you make the following change in your medication:   1) Stop your Aspirin 81MG  once a day  *If you need a refill on your cardiac medications before your next appointment, please call your pharmacy*  Lab Work:  You will have labs drawn today: BMET   If you have labs (blood work) drawn today and your tests are completely normal, you will receive your results only by: MyChart Message (if you have MyChart) OR . A paper copy in the mail If you have any lab test that is abnormal or we need to change your treatment, we will call you to review the results.  Testing/Procedures:  None ordered today  Follow-Up: At [facility], you and your health needs are our priority.  As part of our continuing mission to provide you with exceptional heart care, we have created designated Provider Care Teams.  These Care Teams include your primary Cardiologist (physician) and Advanced Practice Providers (APPs -  Physician Assistants and Nurse Practitioners) who all work together to provide you with the care you need, when you need it.  Your next appointment:   3 month(s)  The format for your next appointment:   In Person  Provider:   [facility], MD

## 2019-03-13 LAB — BASIC METABOLIC PANEL
BUN/Creatinine Ratio: 27 (ref 12–28)
BUN: 17 mg/dL (ref 8–27)
CO2: 29 mmol/L (ref 20–29)
Calcium: 8.9 mg/dL (ref 8.7–10.3)
Chloride: 102 mmol/L (ref 96–106)
Creatinine, Ser: 0.64 mg/dL (ref 0.57–1.00)
GFR calc Af Amer: 105 mL/min/{1.73_m2} (ref >59)
GFR calc non Af Amer: 91 mL/min/{1.73_m2} (ref >59)
Glucose: 87 mg/dL (ref 65–99)
Potassium: 4.3 mmol/L (ref 3.5–5.2)
Sodium: 140 mmol/L (ref 134–144)

## 2019-03-15 ENCOUNTER — Ambulatory Visit: Payer: Medicare Other | Admitting: Internal Medicine

## 2019-03-31 ENCOUNTER — Other Ambulatory Visit: Payer: Self-pay

## 2019-03-31 ENCOUNTER — Ambulatory Visit: Payer: Medicare PPO | Attending: Orthopaedic Surgery

## 2019-03-31 DIAGNOSIS — M6281 Muscle weakness (generalized): Secondary | ICD-10-CM | POA: Diagnosis present

## 2019-03-31 DIAGNOSIS — M25562 Pain in left knee: Secondary | ICD-10-CM | POA: Insufficient documentation

## 2019-03-31 DIAGNOSIS — G8929 Other chronic pain: Secondary | ICD-10-CM | POA: Diagnosis present

## 2019-03-31 DIAGNOSIS — R2689 Other abnormalities of gait and mobility: Secondary | ICD-10-CM | POA: Diagnosis present

## 2019-03-31 NOTE — Patient Instructions (Signed)
Access Code: [email or web address]  URL: [email or web address]  Date: 03/31/2019  Prepared by: Lorrene Reid   Exercises Seated Long Arc Quad - 10 reps - 2 sets - 5 hold - 3x daily - 7x weekly Seated March - 10 reps - 3 sets - 3x daily - 7x weekly Seated Heel Toe Raises - 10 reps - 2 sets - 3x daily - 7x weekly Seated Hamstring Stretch - 3 reps - 1 sets - 20 hold - 3x daily - 7x weekly

## 2019-03-31 NOTE — Therapy (Signed)
[facility] [address], [address] [place], [place], [address] Phone: [phone]   Fax:  [phone]  Physical Therapy Evaluation  Patient Details  Name: Meghan Schwartz MRN: [id number] Date of Birth: Dec 01, 1950 Referring Provider (PT): Norlene Campbell, MD   Encounter Date: 03/31/2019  PT End of Session - 03/31/19 1650    Visit Number  1    Date for PT Re-Evaluation  05/16/19    Authorization Type  [employer]    PT Start Time  1552    PT Stop Time  1639    PT Time Calculation (min)  47 min    Activity Tolerance  Patient tolerated treatment well       Past Medical History:  Diagnosis Date  . Allergic rhinitis   . Anxiety   . Benzodiazepine dependence (HCC)   . Bruxism   . CAD (coronary artery disease)    S/p NSTEMI 01/2018 >> LHC: oLAD 20, pLAD 20; oD1 20, oLCx 40, pRCA 99 >> PCI: DES to prox RCA // Echo 01/2018: EF 50-55; prob inf-lat and inf HK  . Chronic diastolic heart failure    Echocardiogram 07/2018: EF 55-60, grade 1 diastolic dysfunction, normal wall motion, normal GLS (-22.3), PASP 28  . Chronic foot pain    bunions, torn ligaments-on chronic pain medication  . CTS (carpal tunnel syndrome) 06/08/2014  . Depression   . High cholesterol   . Hypertension   . MDD (major depressive disorder) 10/02/2017  . Migraine   . Myocardial infarction (HCC) 02/01/2018  . Pneumonia 1986   left lung  . RA (rheumatoid arthritis) (HCC)     Past Surgical History:  Procedure Laterality Date  . BTL  2000  . CARPAL TUNNEL RELEASE Left   . CESAREAN SECTION  1978. 1983  . CORONARY STENT INTERVENTION N/A 01/27/2018   Procedure: CORONARY STENT INTERVENTION;  Surgeon: Kathleene Hazel, MD;  [facility];  Service: Cardiovascular;  Laterality: N/A;  . eye implants    . FOOT SURGERY Left 2008  . front teeth removed after injury    . KNEE ARTHROSCOPY WITH LATERAL MENISECTOMY Left 02/02/2019   Procedure: LEFT KNEE  ARTHROSCOPY, MEDIAL AND LATERAL MENISECTOMY, EXCISION OF LEFT LATERAL MENISCAL CYST;  Surgeon: Valeria Batman, MD;  Location: [facility];  Service: Orthopedics;  Laterality: Left;  . LEFT HEART CATH AND CORONARY ANGIOGRAPHY N/A 01/27/2018   Procedure: LEFT HEART CATH AND CORONARY ANGIOGRAPHY;  Surgeon: Kathleene Hazel, MD;  [facility];  Service: Cardiovascular;  Laterality: N/A;  . pre cancerous lesion removed from forehead    . TOTAL ABDOMINAL HYSTERECTOMY W/ BILATERAL SALPINGOOPHORECTOMY  2002   Dr. Jackelyn Knife  . VAGINAL HYSTERECTOMY  2001    There were no vitals filed for this visit.   Subjective Assessment - 03/31/19 1559    Subjective  Pt presents to PT ~2 months s/p Lt knee arthroscopy surgery for debridement.  Pt had a steroid injection yesterday into her hip at the rheumatologist and she reports improved gait after that injection.    Pertinent History  RA, foot deformity with rheumatoid drift, LE edema    Limitations  Walking    How long can you stand comfortably?  20 minutes- limited by RA in feet.    How long can you walk comfortably?  limited by knee and feet: 20-30 minutes    Currently in Pain?  Yes    Pain Score  2    up  to 7/10   Pain Location  Knee    Pain Orientation  Left    Pain Descriptors / Indicators  Tightness;Aching    Pain Type  Surgical pain    Pain Onset  More than a month ago    Pain Frequency  Constant    Aggravating Factors   standing/walking    Pain Relieving Factors  rest    Effect of Pain on Daily Activities  limited standing and walking due to both RA in feet and Lt knee pain         [facility] PT Assessment - 03/31/19 0001      Assessment   Medical Diagnosis  meniscal cyst, Lt   Lt knee artrhoscopy 02/02/19   Referring Provider (PT)  Norlene Campbell, MD    Onset Date/Surgical Date  02/02/19      Precautions   Precautions  Other (comment)      Restrictions   Weight Bearing Restrictions  No      Balance Screen   Has  the patient fallen in the past 6 months  Yes    How many times?  6   frequent falls-will address in PT   Has the patient had a decrease in activity level because of a fear of falling?   No    Is the patient reluctant to leave their home because of a fear of falling?   No      Home Environment   Living Environment  Private residence    Living Arrangements  Spouse/significant other    Type of Home  House    Home Access  Stairs to enter    Home Layout  Two level      Prior Function   Level of Independence  Independent    Vocation  Retired    Leisure  reading, shopping      Cognition   Overall Cognitive Status  Within Functional Limits for tasks assessed      Observation/Other Assessments   Focus on Therapeutic Outcomes (FOTO)   63% limitation      Posture/Postural Control   Posture/Postural Control  Postural limitations    Postural Limitations  Forward head      ROM / Strength   AROM / PROM / Strength  AROM;PROM;Strength      AROM   Overall AROM   Within functional limits for tasks performed    Overall AROM Comments  Pt with RA, all joints are limited.  Lt knee 0-115 degrees       Strength   Overall Strength  Deficits    Overall Strength Comments  Bil hips 4/5, bil knees 4/5, ankle DF 4+/5      Palpation   Patella mobility  reduced patellar mobility on the Lt-all directions without pain    Palpation comment  edema in bil LEs associated with CHF.  Pt with great toe valgus bilaterally (rheumatoid drift)      Transfers   Transfers  Sit to Stand;Stand to Sit    Sit to Stand  With upper extremity assist    Five time sit to stand comments   39.16 seconds without UE support- crepitus in Lt knee with pain    Stand to Sit  Without upper extremity assist      Ambulation/Gait   Ambulation/Gait  Yes    Gait Pattern  Step-through pattern;Decreased dorsiflexion - left;Decreased dorsiflexion - right;Decreased weight shift to left;Decreased weight shift to right  Objective measurements completed on examination: See above findings.              PT Education - 03/31/19 1632    Education Details  Access Code: [email or web address]    Person(s) Educated  Patient    Methods  Explanation;Demonstration;Handout    Comprehension  Verbalized understanding;Returned demonstration       PT Short Term Goals - 03/31/19 1647      PT SHORT TERM GOAL #1   Title  be independent in initial HEP    Time  3    Period  Weeks    Status  New    Target Date  04/21/19      PT SHORT TERM GOAL #2   Title  improve LE strength to ascend steps with step-over-step and use of 1 rail    Time  3    Period  Weeks    Status  New    Target Date  04/21/19      PT SHORT TERM GOAL #3   Title  perform 5x sit to stand in < or = to 30 seconds without UE support to reduce falls risk    Time  3    Period  Weeks    Status  New    Target Date  04/21/19      PT SHORT TERM GOAL #4   Title  report < or = to 5/10 Lt knee pain with standing and walking    Time  3    Period  Weeks    Status  New    Target Date  04/21/19        PT Long Term Goals - 03/31/19 1614      PT LONG TERM GOAL #1   Title  be independent in advanced HEP    Time  6    Period  Weeks    Status  New    Target Date  05/16/19      PT LONG TERM GOAL #2   Title  improve LE strength to ascend and descend steps with step-over-step gait    Time  6    Period  Weeks    Status  New    Target Date  05/16/19      PT LONG TERM GOAL #3   Title  perform 5x sit to stand in < or = to 20 seconds without UE support to reduce falls risk    Time  6    Period  Weeks    Status  New    Target Date  05/16/19      PT LONG TERM GOAL #4   Title  reduce FOTO to < or = to 39% limitation    Time  6    Period  Weeks    Status  New    Target Date  05/16/19             Plan - 03/31/19 1654    Clinical Impression Statement  Pt presents to PT ~2 months s/p Lt knee scope for debridement and cyst  removal.  Pt has complex medical history with congestive heart failure (LE edema) and rheumatoid arthritis.  Pt is a falls risk with 5x/sit to stand 39.16 seconds without use of hands.  Pt with crepitus in the Lt knee with all movement.  Pt reports 2-7/10 Lt knee pain with standing and most standing and walking is limited by bil foot RA with deformity.  Pt ambulates with reduced step length, reduced  DF bilaterally and limited trunk rotation.  Pt with weakness in bil LEs of a chronic nature and ascends and descends steps with step-to gait pattern.  Pt has had 6 falls in the past 6 months.  Pt will benefit from skilled PT for comprehensive strength and balance training to improve safety and independence at home and in the community.    Personal Factors and Comorbidities  Comorbidity 3+    Comorbidities  Lt knee scope, CHF/MI, RA    Examination-Activity Limitations  Stand;Stairs;Squat;Transfers    Examination-Participation Restrictions  Community Activity;Driving;Shop;Meal Prep    Stability/Clinical Decision Making  Evolving/Moderate complexity    Clinical Decision Making  Moderate    Rehab Potential  Good    PT Frequency  2x / week    PT Duration  6 weeks    PT Treatment/Interventions  Cryotherapy;ADLs/Self Care Home Management;Electrical Stimulation;Moist Heat;Gait training;Stair training;Functional mobility training;Therapeutic activities;Therapeutic exercise;Balance training;Neuromuscular re-education;Manual techniques;Taping;Passive range of motion;Aquatic Therapy    PT Next Visit Plan  balance training, try NuStep, discuss aquatics with patient    PT Home Exercise Plan  Access Code: [email or web address]    Consulted and Agree with Plan of Care  Patient       Patient will benefit from skilled therapeutic intervention in order to improve the following deficits and impairments:  Abnormal gait, Decreased activity tolerance, Decreased balance, Decreased mobility, Decreased endurance, Decreased range of motion,  Decreased safety awareness, Decreased strength, Impaired flexibility, Difficulty walking  Visit Diagnosis: Chronic pain of left knee - Plan: PT plan of care cert/re-cert  Muscle weakness (generalized) - Plan: PT plan of care cert/re-cert  Other abnormalities of gait and mobility - Plan: PT plan of care cert/re-cert     Problem List Patient Active Problem List   Diagnosis Date Noted  . Hypoxia 03/05/2019  . Derangement of posterior horn of medial meniscus 02/02/2019  . Derangement of posterior horn of lateral meniscus 02/02/2019  . Meniscal cyst, unspecified laterality 12/01/2018  . Torn medial meniscus 12/01/2018  . Primary osteoarthritis of left knee 12/01/2018  . Coronary artery disease involving native coronary artery of native heart without angina pectoris 09/14/2018  . Chronic diastolic (congestive) heart failure ([place])   . Chronic pain of left knee 05/15/2018  . Mass of left knee 05/15/2018  . Non-ST elevation (NSTEMI) myocardial infarction ([place])   . Chest pain 01/25/2018  . RA (rheumatoid arthritis) ([place]) 01/25/2018  . Hyperlipidemia 01/25/2018  . Hypokalemia 01/25/2018  . Depression with anxiety 01/25/2018  . Tobacco abuse 01/25/2018  . Lower extremity cellulitis 01/25/2018  . Benzodiazepine dependence ([place])   . MDD (major depressive disorder), recurrent severe, without psychosis ([place]) 10/02/2017  . CTS (carpal tunnel syndrome) 06/08/2014     Sigurd Sos, PT 03/31/19 5:01 PM  [facility] [address], [place], [place], [address] Phone: [phone]   Fax:  [phone]  Name: Meghan Schwartz MRN: [id number] Date of Birth: 1950-05-19

## 2019-04-05 ENCOUNTER — Other Ambulatory Visit: Payer: Self-pay

## 2019-04-05 ENCOUNTER — Ambulatory Visit: Payer: Medicare PPO

## 2019-04-05 DIAGNOSIS — M25562 Pain in left knee: Secondary | ICD-10-CM

## 2019-04-05 DIAGNOSIS — R2689 Other abnormalities of gait and mobility: Secondary | ICD-10-CM

## 2019-04-05 DIAGNOSIS — M6281 Muscle weakness (generalized): Secondary | ICD-10-CM

## 2019-04-05 DIAGNOSIS — G8929 Other chronic pain: Secondary | ICD-10-CM

## 2019-04-05 NOTE — Patient Instructions (Signed)
     [facility] Welcome to [place]! Here you will find all the information you will need regarding your pool therapy. If you have further questions at any time, please call our office at [phone]. After completing your initial evaluation in the [place] clinic, you may be eligible to complete a portion of your therapy in the pool. A typical week of therapy will consist of 1-2 typical physical therapy visits at our [place] location and an additional session of therapy in the pool located at the [facility] at [place], [place], [place] [address].  Aquatic therapy will be offered on Friday afternoons. Each session will last approximately 30 minutes. All scheduling and payments for aquatic therapy sessions, including cancelations, will be done through our [place] location.  To be eligible for aquatic therapy, these criteria must be met: . You must be able to independently change in the locker room and get to the pool deck. A caregiver can come with you to help if needed. There are bleachers for a caregiver to sit on next to the pool. . No one with an open wound is permitted in the pool.  Handicap parking is available in the front and there is a drop off option for even closer accessibility. Please arrive 15 minutes prior to your appointment to prepare for your pool session. You must sign in at the front desk upon your arrival. Please be sure to attend to any toileting needs prior to entering the pool. [place] rooms for changing are located to the right of the check-in desk. There is direct access to the pool deck from the locker room. You can lock your belongings in a locker but must bring a lock. Your therapist will greet you on the pool deck. There may be other swimmers in the pool at the same time but your session is one-on-one with the therapist.  [facility] [address], [place], [place]  [address] Phone # [phone] Fax [phone]

## 2019-04-05 NOTE — Therapy (Signed)
[facility] [address] [place], [place], [address] Phone: [phone]   Fax:  [phone]  Physical Therapy Treatment  Patient Details  Name: Meghan Schwartz MRN: [id number] Date of Birth: 07-28-1950 Referring Provider (PT): Norlene Campbell, MD   Encounter Date: 04/05/2019  PT End of Session - 04/05/19 1526    Visit Number  2    Date for PT Re-Evaluation  05/16/19    Authorization Type  [employer]    PT Start Time  1445    PT Stop Time  1527    PT Time Calculation (min)  42 min    Activity Tolerance  Patient tolerated treatment well    Behavior During Therapy  [facility] for tasks assessed/performed       Past Medical History:  Diagnosis Date  . Allergic rhinitis   . Anxiety   . Benzodiazepine dependence (HCC)   . Bruxism   . CAD (coronary artery disease)    S/p NSTEMI 01/2018 >> LHC: oLAD 20, pLAD 20; oD1 20, oLCx 40, pRCA 99 >> PCI: DES to prox RCA // Echo 01/2018: EF 50-55; prob inf-lat and inf HK  . Chronic diastolic heart failure    Echocardiogram 07/2018: EF 55-60, grade 1 diastolic dysfunction, normal wall motion, normal GLS (-22.3), PASP 28  . Chronic foot pain    bunions, torn ligaments-on chronic pain medication  . CTS (carpal tunnel syndrome) 06/08/2014  . Depression   . High cholesterol   . Hypertension   . MDD (major depressive disorder) 10/02/2017  . Migraine   . Myocardial infarction (HCC) 02/01/2018  . Pneumonia 1986   left lung  . RA (rheumatoid arthritis) (HCC)     Past Surgical History:  Procedure Laterality Date  . BTL  2000  . CARPAL TUNNEL RELEASE Left   . CESAREAN SECTION  1978. 1983  . CORONARY STENT INTERVENTION N/A 01/27/2018   Procedure: CORONARY STENT INTERVENTION;  Surgeon: Kathleene Hazel, MD;  [facility];  Service: Cardiovascular;  Laterality: N/A;  . eye implants    . FOOT SURGERY Left 2008  . front teeth removed after injury    . KNEE ARTHROSCOPY WITH  LATERAL MENISECTOMY Left 02/02/2019   Procedure: LEFT KNEE ARTHROSCOPY, MEDIAL AND LATERAL MENISECTOMY, EXCISION OF LEFT LATERAL MENISCAL CYST;  Surgeon: Valeria Batman, MD;  Location: [facility];  Service: Orthopedics;  Laterality: Left;  . LEFT HEART CATH AND CORONARY ANGIOGRAPHY N/A 01/27/2018   Procedure: LEFT HEART CATH AND CORONARY ANGIOGRAPHY;  Surgeon: Kathleene Hazel, MD;  [facility];  Service: Cardiovascular;  Laterality: N/A;  . pre cancerous lesion removed from forehead    . TOTAL ABDOMINAL HYSTERECTOMY W/ BILATERAL SALPINGOOPHORECTOMY  2002   Dr. Jackelyn Knife  . VAGINAL HYSTERECTOMY  2001    There were no vitals filed for this visit.  Subjective Assessment - 04/05/19 1450    Subjective  I had a bad day after I was here last time.  I have been doing seated heel/toe raises and not the others.    Currently in Pain?  Yes    Pain Score  3     Pain Location  Knee    Pain Orientation  Left    Pain Descriptors / Indicators  Aching;Tightness    Pain Type  Surgical pain    Pain Onset  More than a month ago    Pain Frequency  Constant    Aggravating Factors   standing/walking  Pain Relieving Factors  rest                       [facility] Adult PT Treatment/Exercise - 04/05/19 0001      Exercises   Exercises  Knee/Hip;Ankle      Knee/Hip Exercises: Stretches   Active Hamstring Stretch  Both;2 reps;20 seconds      Knee/Hip Exercises: Aerobic   Nustep  Level 1 x 7 minutes    PT present to discuss     Knee/Hip Exercises: Standing   Other Standing Knee Exercises  on black pad: weight shifting side to side x 1 minute      Knee/Hip Exercises: Seated   Long Arc Quad  Strengthening;Both;1 set;15 reps    [employer]  5" hold x 20    Marching  Strengthening;Both;2 sets;10 reps      Ankle Exercises: Seated   Heel Raises  Both;20 reps    Toe Raise  20 reps    Other Seated Ankle Exercises  rocker board x 3 minutes             PT  Education - 04/05/19 1510    Education Details  aquatics info    Person(s) Educated  Patient    Methods  Explanation;Demonstration;Handout    Comprehension  Verbalized understanding;Returned demonstration       PT Short Term Goals - 03/31/19 1647      PT SHORT TERM GOAL #1   Title  be independent in initial HEP    Time  3    Period  Weeks    Status  New    Target Date  04/21/19      PT SHORT TERM GOAL #2   Title  improve LE strength to ascend steps with step-over-step and use of 1 rail    Time  3    Period  Weeks    Status  New    Target Date  04/21/19      PT SHORT TERM GOAL #3   Title  perform 5x sit to stand in < or = to 30 seconds without UE support to reduce falls risk    Time  3    Period  Weeks    Status  New    Target Date  04/21/19      PT SHORT TERM GOAL #4   Title  report < or = to 5/10 Lt knee pain with standing and walking    Time  3    Period  Weeks    Status  New    Target Date  04/21/19        PT Long Term Goals - 03/31/19 1614      PT LONG TERM GOAL #1   Title  be independent in advanced HEP    Time  6    Period  Weeks    Status  New    Target Date  05/16/19      PT LONG TERM GOAL #2   Title  improve LE strength to ascend and descend steps with step-over-step gait    Time  6    Period  Weeks    Status  New    Target Date  05/16/19      PT LONG TERM GOAL #3   Title  perform 5x sit to stand in < or = to 20 seconds without UE support to reduce falls risk    Time  6    Period  Weeks    Status  New    Target Date  05/16/19      PT LONG TERM GOAL #4   Title  reduce FOTO to < or = to 39% limitation    Time  6    Period  Weeks    Status  New    Target Date  05/16/19            Plan - 04/05/19 1512    Clinical Impression Statement  Pt with first time follow-up today and session spent with review of HEP and discussion regarding aquatic PT.  Pt would be a good candidate for PT due to widespread RA and foot pain.  Pt has not been  compliant with HEP and PT emphasized importance of compliance at home.  Pt required close supervision to monitor for technique and safety.  Pt will continue to benefit from skilled PT to address balance, strength and Lt knee pain s/p surgery.    PT Frequency  2x / week    PT Duration  6 weeks    PT Treatment/Interventions  Cryotherapy;ADLs/Self Care Home Management;Electrical Stimulation;Moist Heat;Gait training;Stair training;Functional mobility training;Therapeutic activities;Therapeutic exercise;Balance training;Neuromuscular re-education;Manual techniques;Taping;Passive range of motion;Aquatic Therapy    PT Next Visit Plan  balance training, Nu Step, gentle strength    PT Home Exercise Plan  Access Code: [email or web address]    Consulted and Agree with Plan of Care  Patient       Patient will benefit from skilled therapeutic intervention in order to improve the following deficits and impairments:  Abnormal gait, Decreased activity tolerance, Decreased balance, Decreased mobility, Decreased endurance, Decreased range of motion, Decreased safety awareness, Decreased strength, Impaired flexibility, Difficulty walking  Visit Diagnosis: Muscle weakness (generalized)  Chronic pain of left knee  Other abnormalities of gait and mobility     Problem List Patient Active Problem List   Diagnosis Date Noted  . Hypoxia 03/05/2019  . Derangement of posterior horn of medial meniscus 02/02/2019  . Derangement of posterior horn of lateral meniscus 02/02/2019  . Meniscal cyst, unspecified laterality 12/01/2018  . Torn medial meniscus 12/01/2018  . Primary osteoarthritis of left knee 12/01/2018  . Coronary artery disease involving native coronary artery of native heart without angina pectoris 09/14/2018  . Chronic diastolic (congestive) heart failure ([place])   . Chronic pain of left knee 05/15/2018  . Mass of left knee 05/15/2018  . Non-ST elevation (NSTEMI) myocardial infarction ([place])   . Chest pain  01/25/2018  . RA (rheumatoid arthritis) ([place]) 01/25/2018  . Hyperlipidemia 01/25/2018  . Hypokalemia 01/25/2018  . Depression with anxiety 01/25/2018  . Tobacco abuse 01/25/2018  . Lower extremity cellulitis 01/25/2018  . Benzodiazepine dependence ([place])   . MDD (major depressive disorder), recurrent severe, without psychosis ([place]) 10/02/2017  . CTS (carpal tunnel syndrome) 06/08/2014    Sigurd Sos, PT 04/05/19 3:30 PM  [facility] [address], [place], [place], [address] Phone: [phone]   Fax:  [phone]  Name: Meghan Schwartz MRN: [id number] Date of Birth: 07-31-1950

## 2019-04-06 ENCOUNTER — Ambulatory Visit: Payer: Medicare PPO | Admitting: Orthopaedic Surgery

## 2019-04-06 ENCOUNTER — Encounter: Payer: Self-pay | Admitting: Orthopaedic Surgery

## 2019-04-06 VITALS — Ht 65.0 in | Wt 153.0 lb

## 2019-04-06 DIAGNOSIS — M23352 Other meniscus derangements, posterior horn of lateral meniscus, left knee: Secondary | ICD-10-CM

## 2019-04-06 DIAGNOSIS — M1712 Unilateral primary osteoarthritis, left knee: Secondary | ICD-10-CM

## 2019-04-06 NOTE — Progress Notes (Signed)
Office Visit Note   Patient: RENNEE Schwartz           Date of Birth: [birth year]           MRN: [id number] Visit Date: 04/06/2019              Requested by: Maurice Small, MD [place],  [place] [address] PCP: Maurice Small, MD   Assessment & Plan: Visit Diagnoses:  1. Derangement of posterior horn of lateral meniscus of left knee   2. Primary osteoarthritis of left knee     Plan: 2 months status post left knee surgery involving partial medial lateral meniscectomy with chondroplasty.  Also excised a large lateral meniscal cyst.  Doing very well.  Back on Enbrel for her rheumatoid arthritis and feeling much better.  Not using any ambulatory aid.  Finished a course of therapy and very happy with her present status  Follow-Up Instructions: Return if symptoms worsen or fail to improve.   Orders:  No orders of the defined types were placed in this encounter.  No orders of the defined types were placed in this encounter.     Procedures: No procedures performed   Clinical Data: No additional findings.   Subjective: Chief Complaint  Patient presents with  . Left Knee - Follow-up    Left knee arthroscopy DOS 02/02/2019  Patient presents today for follow up on her left knee. She had a left knee arthroscopy with lateral cyst excision on 02/02/2019. She has been going to physical therapy. Patient states that she just started the therapy last week and is going twice weekly. She is taking Tylenol for pain as needed.   HPI  Review of Systems   Objective: Vital Signs: Ht 5\' 5"  (1.651 m)   Wt 153 lb (69.4 kg)   BMI 25.46 kg/m   Physical Exam  Ortho Exam left knee was not hot red warm or swollen.  No effusion.  Full extension and over 120 degrees of flexion.  Open a little bit laterally the varus stress but similar to the right knee.  Little bit of fullness in the popliteal space but no pain.  No calf swelling.  Some venous stasis changes  distally.  Specialty Comments:  No specialty comments available.  Imaging: No results found.   PMFS History: Patient Active Problem List   Diagnosis Date Noted  . Hypoxia 03/05/2019  . Derangement of posterior horn of medial meniscus 02/02/2019  . Derangement of posterior horn of lateral meniscus 02/02/2019  . Meniscal cyst, unspecified laterality 12/01/2018  . Torn medial meniscus 12/01/2018  . Primary osteoarthritis of left knee 12/01/2018  . Coronary artery disease involving native coronary artery of native heart without angina pectoris 09/14/2018  . Chronic diastolic (congestive) heart failure ([place])   . Chronic pain of left knee 05/15/2018  . Mass of left knee 05/15/2018  . Non-ST elevation (NSTEMI) myocardial infarction ([place])   . Chest pain 01/25/2018  . RA (rheumatoid arthritis) ([place]) 01/25/2018  . Hyperlipidemia 01/25/2018  . Hypokalemia 01/25/2018  . Depression with anxiety 01/25/2018  . Tobacco abuse 01/25/2018  . Lower extremity cellulitis 01/25/2018  . Benzodiazepine dependence ([place])   . MDD (major depressive disorder), recurrent severe, without psychosis ([place]) 10/02/2017  . CTS (carpal tunnel syndrome) 06/08/2014   Past Medical History:  Diagnosis Date  . Allergic rhinitis   . Anxiety   . Benzodiazepine dependence ([place])   . Bruxism   . CAD (coronary artery disease)  S/p NSTEMI 01/2018 >> LHC: oLAD 20, pLAD 20; oD1 20, oLCx 40, pRCA 99 >> PCI: DES to prox RCA // Echo 01/2018: EF 50-55; prob inf-lat and inf HK  . Chronic diastolic heart failure    Echocardiogram 07/2018: EF 55-60, grade 1 diastolic dysfunction, normal wall motion, normal GLS (-22.3), PASP 28  . Chronic foot pain    bunions, torn ligaments-on chronic pain medication  . CTS (carpal tunnel syndrome) 06/08/2014  . Depression   . High cholesterol   . Hypertension   . MDD (major depressive disorder) 10/02/2017  . Migraine   . Myocardial infarction (HCC) 02/01/2018  . Pneumonia 1986   left  lung  . RA (rheumatoid arthritis) (HCC)     Family History  Problem Relation Age of Onset  . Thyroid disease Mother   . Breast cancer Mother   . Parkinson's disease Father   . Heart attack Paternal Grandfather   . Stroke Paternal Grandmother   . Arthritis Maternal Grandmother   . Heart attack Maternal Grandmother   . Raynaud syndrome Maternal Aunt     Past Surgical History:  Procedure Laterality Date  . BTL  2000  . CARPAL TUNNEL RELEASE Left   . CESAREAN SECTION  1978. 1983  . CORONARY STENT INTERVENTION N/A 01/27/2018   Procedure: CORONARY STENT INTERVENTION;  Surgeon: Kathleene Hazel, MD;  Location: [facility];  Service: Cardiovascular;  Laterality: N/A;  . eye implants    . FOOT SURGERY Left 2008  . front teeth removed after injury    . KNEE ARTHROSCOPY WITH LATERAL MENISECTOMY Left 02/02/2019   Procedure: LEFT KNEE ARTHROSCOPY, MEDIAL AND LATERAL MENISECTOMY, EXCISION OF LEFT LATERAL MENISCAL CYST;  Surgeon: Valeria Batman, MD;  Location: [facility];  Service: Orthopedics;  Laterality: Left;  . LEFT HEART CATH AND CORONARY ANGIOGRAPHY N/A 01/27/2018   Procedure: LEFT HEART CATH AND CORONARY ANGIOGRAPHY;  Surgeon: Kathleene Hazel, MD;  Location: [facility];  Service: Cardiovascular;  Laterality: N/A;  . pre cancerous lesion removed from forehead    . TOTAL ABDOMINAL HYSTERECTOMY W/ BILATERAL SALPINGOOPHORECTOMY  2002   Dr. Jackelyn Knife  . VAGINAL HYSTERECTOMY  2001   Social History   Occupational History  . Occupation: Retired   Tobacco Use  . Smoking status: Former Smoker    Years: 20.00    Types: Cigarettes    Quit date: 02/25/2010    Years since quitting: 9.1  . Smokeless tobacco: Never Used  . Tobacco comment: Restarted smoking earlier this year. smokes during stressful time   Substance and Sexual Activity  . Alcohol use: No    Alcohol/week: 0.0 standard drinks  . Drug use: No  . Sexual activity: Not on file

## 2019-04-13 ENCOUNTER — Ambulatory Visit: Payer: Medicare PPO | Admitting: Physical Therapy

## 2019-04-13 ENCOUNTER — Other Ambulatory Visit: Payer: Self-pay

## 2019-04-13 ENCOUNTER — Encounter: Payer: Self-pay | Admitting: Physical Therapy

## 2019-04-13 DIAGNOSIS — R2689 Other abnormalities of gait and mobility: Secondary | ICD-10-CM

## 2019-04-13 DIAGNOSIS — M25562 Pain in left knee: Secondary | ICD-10-CM

## 2019-04-13 DIAGNOSIS — M6281 Muscle weakness (generalized): Secondary | ICD-10-CM

## 2019-04-13 DIAGNOSIS — G8929 Other chronic pain: Secondary | ICD-10-CM

## 2019-04-13 NOTE — Therapy (Signed)
[facility] [address], [address] [place], [place], [address] Phone: [phone]   Fax:  [phone]  Physical Therapy Treatment  Patient Details  Name: Meghan Schwartz MRN: [id number] Date of Birth: 1950/10/05 Referring Provider (PT): Norlene Campbell, MD   Encounter Date: 04/13/2019  PT End of Session - 04/13/19 1502    Visit Number  3    Date for PT Re-Evaluation  05/16/19    Authorization Type  [employer]    PT Start Time  1452    PT Stop Time  1530    PT Time Calculation (min)  38 min    Activity Tolerance  Patient tolerated treatment well    Behavior During Therapy  [facility] for tasks assessed/performed       Past Medical History:  Diagnosis Date  . Allergic rhinitis   . Anxiety   . Benzodiazepine dependence (HCC)   . Bruxism   . CAD (coronary artery disease)    S/p NSTEMI 01/2018 >> LHC: oLAD 20, pLAD 20; oD1 20, oLCx 40, pRCA 99 >> PCI: DES to prox RCA // Echo 01/2018: EF 50-55; prob inf-lat and inf HK  . Chronic diastolic heart failure    Echocardiogram 07/2018: EF 55-60, grade 1 diastolic dysfunction, normal wall motion, normal GLS (-22.3), PASP 28  . Chronic foot pain    bunions, torn ligaments-on chronic pain medication  . CTS (carpal tunnel syndrome) 06/08/2014  . Depression   . High cholesterol   . Hypertension   . MDD (major depressive disorder) 10/02/2017  . Migraine   . Myocardial infarction (HCC) 02/01/2018  . Pneumonia 1986   left lung  . RA (rheumatoid arthritis) (HCC)     Past Surgical History:  Procedure Laterality Date  . BTL  2000  . CARPAL TUNNEL RELEASE Left   . CESAREAN SECTION  1978. 1983  . CORONARY STENT INTERVENTION N/A 01/27/2018   Procedure: CORONARY STENT INTERVENTION;  Surgeon: Kathleene Hazel, MD;  Location: [facility];  Service: Cardiovascular;  Laterality: N/A;  . eye implants    . FOOT SURGERY Left 2008  . front teeth removed after injury    . KNEE ARTHROSCOPY WITH  LATERAL MENISECTOMY Left 02/02/2019   Procedure: LEFT KNEE ARTHROSCOPY, MEDIAL AND LATERAL MENISECTOMY, EXCISION OF LEFT LATERAL MENISCAL CYST;  Surgeon: Valeria Batman, MD;  Location: [facility];  Service: Orthopedics;  Laterality: Left;  . LEFT HEART CATH AND CORONARY ANGIOGRAPHY N/A 01/27/2018   Procedure: LEFT HEART CATH AND CORONARY ANGIOGRAPHY;  Surgeon: Kathleene Hazel, MD;  [facility]: [facility];  Service: Cardiovascular;  Laterality: N/A;  . pre cancerous lesion removed from forehead    . TOTAL ABDOMINAL HYSTERECTOMY W/ BILATERAL SALPINGOOPHORECTOMY  2002   Dr. Jackelyn Knife  . VAGINAL HYSTERECTOMY  2001    There were no vitals filed for this visit.  Subjective Assessment - 04/13/19 1458    Subjective  I've developed a Baker's cyst so my pain is up just a little bit.  The MD didn't seem too worried about it.    Pertinent History  RA, foot deformity with rheumatoid drift, LE edema    Limitations  Walking    How long can you stand comfortably?  20 minutes- limited by RA in feet.    How long can you walk comfortably?  limited by knee and feet: 20-30 minutes    Currently in Pain?  Yes    Pain Score  4     Pain  Location  Knee    Pain Orientation  Left;Posterior    Pain Descriptors / Indicators  Aching;Tightness    Pain Type  Surgical pain    Pain Onset  More than a month ago    Pain Frequency  Constant    Aggravating Factors   stand/walk    Pain Relieving Factors  rest    Effect of Pain on Daily Activities  limited standing and walking due to RA and Lt knee pain                       [facility] Adult PT Treatment/Exercise - 04/13/19 0001      Exercises   Exercises  Knee/Hip;Ankle      Knee/Hip Exercises: Aerobic   Nustep  L1 seat 7 x 9', PT present to discuss HEP and goals      Knee/Hip Exercises: Standing   Heel Raises  Both    Heel Raises Limitations  with toe raises x 10 at counter    Hip Flexion Limitations  marching x 20 at counter    Abduction  Limitations  toe taps x 10 each    Extension Limitations  toe taps x 10 each      Knee/Hip Exercises: Seated   Long Arc Quad  Strengthening;Both;1 set;15 reps    [employer]  5" hold x 10 reps    Marching  Strengthening;Both;20 reps    Sit to [employer]  5 reps;with UE support   PT stand by assist to ensure safety due to fall risk         Balance Exercises - 04/13/19 1511      Balance Exercises: Standing   SLS  Solid surface;Eyes open;Upper extremity support 2;10 secs;3 reps        PT Education - 04/13/19 1517    Education Details  Access Code: [email or web address]    Person(s) Educated  Patient    Methods  Explanation;Demonstration;Verbal cues;Handout    Comprehension  Verbalized understanding;Returned demonstration       PT Short Term Goals - 04/13/19 1534      PT SHORT TERM GOAL #1   Title  be independent in initial HEP    Baseline  working on building more exercises and compliance    Status  On-going      PT SHORT TERM GOAL #2   Title  improve LE strength to ascend steps with step-over-step and use of 1 rail    Status  On-going        PT Long Term Goals - 03/31/19 1614      PT LONG TERM GOAL #1   Title  be independent in advanced HEP    Time  6    Period  Weeks    Status  New    Target Date  05/16/19      PT LONG TERM GOAL #2   Title  improve LE strength to ascend and descend steps with step-over-step gait    Time  6    Period  Weeks    Status  New    Target Date  05/16/19      PT LONG TERM GOAL #3   Title  perform 5x sit to stand in < or = to 20 seconds without UE support to reduce falls risk    Time  6    Period  Weeks    Status  New    Target Date  05/16/19      PT LONG  TERM GOAL #4   Title  reduce FOTO to < or = to 39% limitation    Time  6    Period  Weeks    Status  New    Target Date  05/16/19            Plan - 04/13/19 1530    Clinical Impression Statement  Pt arrived late.  She reported development of Baker's cyst which MD was not  concerned with.  Pain was 4/10 on arrival in Lt posterior knee.  Pt does not want to pursue aquatic PT at this time due to some fears about water.  She is partially compliant with HEP reporting she does exercises she likes.  PT reviewed current program and added a standing routine at countertop for additional weight shifting, balance and strength task progression.  Pt reported 3-4/10 end of session which implied good tolerance of session today.  Continue along POC, add sit to stands to HEP next visit.    Comorbidities  Lt knee scope, CHF/MI, RA    Rehab Potential  Good    PT Frequency  2x / week    PT Duration  6 weeks    PT Treatment/Interventions  Cryotherapy;ADLs/Self Care Home Management;Electrical Stimulation;Moist Heat;Gait training;Stair training;Functional mobility training;Therapeutic activities;Therapeutic exercise;Balance training;Neuromuscular re-education;Manual techniques;Taping;Passive range of motion;Aquatic Therapy    PT Next Visit Plan  Nustep SPM goal 30+, review standing counter routine, intro stairs/step ups, add sit to stands to HEP, trial step ups and weight shifting on trampoline    PT Home Exercise Plan  Access Code: [email or web address]    Consulted and Agree with Plan of Care  Patient       Patient will benefit from skilled therapeutic intervention in order to improve the following deficits and impairments:     Visit Diagnosis: Muscle weakness (generalized)  Chronic pain of left knee  Other abnormalities of gait and mobility     Problem List Patient Active Problem List   Diagnosis Date Noted  . Hypoxia 03/05/2019  . Derangement of posterior horn of medial meniscus 02/02/2019  . Derangement of posterior horn of lateral meniscus 02/02/2019  . Meniscal cyst, unspecified laterality 12/01/2018  . Torn medial meniscus 12/01/2018  . Primary osteoarthritis of left knee 12/01/2018  . Coronary artery disease involving native coronary artery of native heart without angina  pectoris 09/14/2018  . Chronic diastolic (congestive) heart failure (HCC)   . Chronic pain of left knee 05/15/2018  . Mass of left knee 05/15/2018  . Non-ST elevation (NSTEMI) myocardial infarction (HCC)   . Chest pain 01/25/2018  . RA (rheumatoid arthritis) (HCC) 01/25/2018  . Hyperlipidemia 01/25/2018  . Hypokalemia 01/25/2018  . Depression with anxiety 01/25/2018  . Tobacco abuse 01/25/2018  . Lower extremity cellulitis 01/25/2018  . Benzodiazepine dependence (HCC)   . MDD (major depressive disorder), recurrent severe, without psychosis (HCC) 10/02/2017  . CTS (carpal tunnel syndrome) 06/08/2014    Morton Peters, PT 04/13/19 3:36 PM    [facility] [address], [address] [place], [place], [address] Phone: [phone]   Fax:  [phone]  Name: SHANTARA GOOSBY MRN: [id number] Date of Birth: [birth year]

## 2019-04-13 NOTE — Patient Instructions (Signed)
Access Code: [email or web address]  URL: [email or web address]  Date: 04/13/2019  Prepared by: Loistine Simas Rakeisha Nyce   Exercises  Seated Long Arc Quad - 10 reps - 2 sets - 5 hold - 3x daily - 7x weekly  Seated March - 10 reps - 3 sets - 3x daily - 7x weekly  Seated Heel Toe Raises - 10 reps - 2 sets - 3x daily - 7x weekly  Seated Hamstring Stretch - 3 reps - 1 sets - 20 hold - 3x daily - 7x weekly  Heel Toe Raises with Counter Support - 10 reps - 2 sets - 1x daily - 7x weekly  Standing March with Counter Support - 20 reps - 2 sets - 1x daily - 7x weekly  Standing Hip Abduction with Counter Support - 10 reps - 2 sets - 1x daily - 7x weekly  Standing Single Leg Stance with Counter Support - 3 reps - 1 sets - 10 hold - 1x daily - 7x weekly

## 2019-04-15 ENCOUNTER — Encounter: Payer: Medicare PPO | Admitting: Physical Therapy

## 2019-04-20 ENCOUNTER — Other Ambulatory Visit: Payer: Self-pay

## 2019-04-20 ENCOUNTER — Encounter: Payer: Self-pay | Admitting: Physical Therapy

## 2019-04-20 ENCOUNTER — Ambulatory Visit: Payer: Medicare PPO | Admitting: Physical Therapy

## 2019-04-20 DIAGNOSIS — R2689 Other abnormalities of gait and mobility: Secondary | ICD-10-CM

## 2019-04-20 DIAGNOSIS — M25562 Pain in left knee: Secondary | ICD-10-CM | POA: Diagnosis not present

## 2019-04-20 DIAGNOSIS — G8929 Other chronic pain: Secondary | ICD-10-CM

## 2019-04-20 DIAGNOSIS — M6281 Muscle weakness (generalized): Secondary | ICD-10-CM

## 2019-04-20 NOTE — Therapy (Signed)
[facility] [address] [place], [place], [address] Phone: [phone]   Fax:  [phone]  Physical Therapy Treatment  Patient Details  Name: Meghan Schwartz MRN: [id number] Date of Birth: February 12, 1951 Referring Provider (PT): Joni Fears, MD   Encounter Date: 04/20/2019  PT End of Session - 04/20/19 1501    Visit Number  4    Date for PT Re-Evaluation  05/16/19    Authorization Type  [employer]    PT Start Time  1500   Pt 15 min late   PT Stop Time  1530    PT Time Calculation (min)  30 min    Activity Tolerance  Patient tolerated treatment well    Behavior During Therapy  [facility] for tasks assessed/performed       Past Medical History:  Diagnosis Date  . Allergic rhinitis   . Anxiety   . Benzodiazepine dependence ([place])   . Bruxism   . CAD (coronary artery disease)    S/p NSTEMI 01/2018 >> LHC: oLAD 20, pLAD 20; oD1 20, oLCx 40, pRCA 99 >> PCI: DES to prox RCA // Echo 01/2018: EF 50-55; prob inf-lat and inf HK  . Chronic diastolic heart failure    Echocardiogram 07/2018: EF 76-73, grade 1 diastolic dysfunction, normal wall motion, normal GLS (-22.3), PASP 28  . Chronic foot pain    bunions, torn ligaments-on chronic pain medication  . CTS (carpal tunnel syndrome) 06/08/2014  . Depression   . High cholesterol   . Hypertension   . MDD (major depressive disorder) 10/02/2017  . Migraine   . Myocardial infarction ([place]) 02/01/2018  . Pneumonia 1986   left lung  . RA (rheumatoid arthritis) ([place])     Past Surgical History:  Procedure Laterality Date  . BTL  2000  . CARPAL TUNNEL RELEASE Left   . [place]  . CORONARY STENT INTERVENTION N/A 01/27/2018   Procedure: CORONARY STENT INTERVENTION;  Surgeon: Burnell Blanks, MD;  Location: [place] [facility];  Service: Cardiovascular;  Laterality: N/A;  . eye implants    . FOOT SURGERY Left 2008  . front teeth removed after injury    . KNEE  ARTHROSCOPY WITH LATERAL MENISECTOMY Left 02/02/2019   Procedure: LEFT KNEE ARTHROSCOPY, MEDIAL AND LATERAL MENISECTOMY, EXCISION OF LEFT LATERAL MENISCAL CYST;  Surgeon: Garald Balding, MD;  Location: [facility];  Service: Orthopedics;  Laterality: Left;  . LEFT HEART CATH AND CORONARY ANGIOGRAPHY N/A 01/27/2018   Procedure: LEFT HEART CATH AND CORONARY ANGIOGRAPHY;  Surgeon: Burnell Blanks, MD;  Location: [place] [facility];  Service: Cardiovascular;  Laterality: N/A;  . pre cancerous lesion removed from forehead    . TOTAL ABDOMINAL HYSTERECTOMY W/ BILATERAL SALPINGOOPHORECTOMY  2002   Dr. Willis Modena  . VAGINAL HYSTERECTOMY  2001    There were no vitals filed for this visit.  Subjective Assessment - 04/20/19 1503    Subjective  I've done some research about the Baker's cyst and know I should be doing some range of motion exercises.  I have been doing some of the exercises but don't remember them all.  I do have the paper you gave me.    Pertinent History  RA, foot deformity with rheumatoid drift, LE edema    Limitations  Walking    How long can you stand comfortably?  20 minutes- limited by RA in feet.    How long can you walk comfortably?  limited by knee  and feet: 20-30 minutes    Currently in Pain?  Yes    Pain Score  1     Pain Location  Knee    Pain Orientation  Left;Posterior    Pain Descriptors / Indicators  Aching;Tightness    Pain Type  Surgical pain    Pain Onset  More than a month ago    Pain Frequency  Constant    Aggravating Factors   stand/walk    Pain Relieving Factors  rest    Effect of Pain on Daily Activities  limited stand/walk due to RA and Lt knee pain                       [facility] Adult PT Treatment/Exercise - 04/20/19 0001      Knee/Hip Exercises: Aerobic   Nustep  L1 seat 7 x 8', PT present to discuss HEP and knee progress      Knee/Hip Exercises: Standing   Heel Raises  Both    Heel Raises Limitations  with toe raises x 10 at  counter    Hip Flexion Limitations  marching x 20 at counter   PT cued high knees   Abduction Limitations  toe taps x 10 each    Extension Limitations  toe taps x 10 each    Forward Step Up  Left;Step Height: 6";Hand Hold: 2;5 sets    Forward Step Up Limitations  PT cued weight shift push through Lt foot      Knee/Hip Exercises: Seated   Long Arc Quad  Strengthening;Both;2 sets;10 reps    [place] Limitations  tried adding red band resistance but Lt knee pain    Marching  Strengthening;Both;20 reps    Marching Limitations  RED BAND added around thighs from last visit          Balance Exercises - 04/20/19 1510      Balance Exercises: Standing   Tandem Stance  Eyes open;Upper extremity support 2;2 reps;10 secs    SLS  Solid surface;Eyes open;Upper extremity support 2;10 secs;3 reps    Tandem Gait  Forward;Retro;5 reps;Upper extremity support          PT Short Term Goals - 04/13/19 1534      PT SHORT TERM GOAL #1   Title  be independent in initial HEP    Baseline  working on building more exercises and compliance    Status  On-going      PT SHORT TERM GOAL #2   Title  improve LE strength to ascend steps with step-over-step and use of 1 rail    Status  On-going        PT Long Term Goals - 03/31/19 1614      PT LONG TERM GOAL #1   Title  be independent in advanced HEP    Time  6    Period  Weeks    Status  New    Target Date  05/16/19      PT LONG TERM GOAL #2   Title  improve LE strength to ascend and descend steps with step-over-step gait    Time  6    Period  Weeks    Status  New    Target Date  05/16/19      PT LONG TERM GOAL #3   Title  perform 5x sit to stand in < or = to 20 seconds without UE support to reduce falls risk    Time  6  Period  Weeks    Status  New    Target Date  05/16/19      PT LONG TERM GOAL #4   Title  reduce FOTO to < or = to 39% limitation    Time  6    Period  Weeks    Status  New    Target Date  05/16/19             Plan - 04/20/19 1719    Clinical Impression Statement  Pt arrived 15 min late.  She admitted she has trouble keeping up with the time so we discussed setting a timer for getting to next visit on time to help her as a strategy.  She reported 1/10 pain in posterior Lt knee due to Baker's cyst.  She has been only somewhat compliant with HEP, doing only a few of the seated and standing exercises.  PT reviewed standing routine and seated ther ex with progression of seated march to use of red band around thighs.  Pt was unable to add weight to LAQ at this time due to knee pain with attempt.  PT added tandem stance and counter tandem walk fwd/bwd today which Pt did well with.  PT also explored step ups as Pt continues to do stairs as step to pattern, keeping Lt knee straight.  She needed cues to weight shift and push through Lt foot to improve pain and performance with step up.  PT encouraged her to trial this at home on her bottom step if she felt safe doing so.  PT did not add any new ther ex given Pt having trouble with compliance.  She will continue to benefit from skilled PT with ongoing assessment and progression as able.    Comorbidities  Lt knee scope, CHF/MI, RA    Rehab Potential  Good    PT Frequency  2x / week    PT Duration  6 weeks    PT Treatment/Interventions  Cryotherapy;ADLs/Self Care Home Management;Electrical Stimulation;Moist Heat;Gait training;Stair training;Functional mobility training;Therapeutic activities;Therapeutic exercise;Balance training;Neuromuscular re-education;Manual techniques;Taping;Passive range of motion;Aquatic Therapy    PT Next Visit Plan  NuStep goal 30+ SPM, f/u on step ups, red band seated march, add sit to stand/tandem stance and tandem walk fwd/bwd if better compliance reported with HEP    PT Home Exercise Plan  Access Code: [email or web address]    Consulted and Agree with Plan of Care  Patient       Patient will benefit from skilled therapeutic intervention  in order to improve the following deficits and impairments:     Visit Diagnosis: Muscle weakness (generalized)  Chronic pain of left knee  Other abnormalities of gait and mobility     Problem List Patient Active Problem List   Diagnosis Date Noted  . Hypoxia 03/05/2019  . Derangement of posterior horn of medial meniscus 02/02/2019  . Derangement of posterior horn of lateral meniscus 02/02/2019  . Meniscal cyst, unspecified laterality 12/01/2018  . Torn medial meniscus 12/01/2018  . Primary osteoarthritis of left knee 12/01/2018  . Coronary artery disease involving native coronary artery of native heart without angina pectoris 09/14/2018  . Chronic diastolic (congestive) heart failure (H[facility])   . Chronic pain of left knee 05/15/2018  . Mass of left knee 05/15/2018  . Non-ST elevation (NSTEMI) myocardial infarction ([facility])   . Chest pain 01/25/2018  . RA (rheumatoid arthritis) (H[facility]) 01/25/2018  . Hyperlipidemia 01/25/2018  . Hypokalemia 01/25/2018  . Depression with anxiety 01/25/2018  . Tobacco abuse  01/25/2018  . Lower extremity cellulitis 01/25/2018  . Benzodiazepine dependence ([facility])   . MDD (major depressive disorder), recurrent severe, without psychosis (H[facility]) 10/02/2017  . CTS (carpal tunnel syndrome) 06/08/2014    Morton Peters, PT 04/20/19 5:25 PM   [facility] [address] [place], [place], [address] Phone: [phone]   Fax:  [phone]  Name: Meghan Schwartz MRN: [id number] Date of Birth: [birth year]

## 2019-04-22 ENCOUNTER — Other Ambulatory Visit: Payer: Self-pay

## 2019-04-22 ENCOUNTER — Encounter: Payer: Self-pay | Admitting: Physical Therapy

## 2019-04-22 ENCOUNTER — Ambulatory Visit: Payer: Medicare PPO | Admitting: Physical Therapy

## 2019-04-22 DIAGNOSIS — M25562 Pain in left knee: Secondary | ICD-10-CM | POA: Diagnosis not present

## 2019-04-22 DIAGNOSIS — R2689 Other abnormalities of gait and mobility: Secondary | ICD-10-CM

## 2019-04-22 DIAGNOSIS — M6281 Muscle weakness (generalized): Secondary | ICD-10-CM

## 2019-04-22 DIAGNOSIS — G8929 Other chronic pain: Secondary | ICD-10-CM

## 2019-04-22 NOTE — Therapy (Signed)
[facility] [address], [address] [place], [place], [address] Phone: [phone]   Fax:  [phone]  Physical Therapy Treatment  Patient Details  Name: Meghan Schwartz MRN: [id number] Date of Birth: 04-17-1950 Referring Provider (PT): Norlene Campbell, MD   Encounter Date: 04/22/2019  PT End of Session - 04/22/19 1453    Visit Number  5    Date for PT Re-Evaluation  05/16/19    Authorization Type  [employer]    Authorization Time Period  03/31/19-05/16/19    Authorization - Visit Number  5    Authorization - Number of Visits  12    PT Start Time  1445    PT Stop Time  1530    PT Time Calculation (min)  45 min    Activity Tolerance  Patient tolerated treatment well    Behavior During Therapy  [facility] for tasks assessed/performed       Past Medical History:  Diagnosis Date  . Allergic rhinitis   . Anxiety   . Benzodiazepine dependence ([facility])   . Bruxism   . CAD (coronary artery disease)    S/p NSTEMI 01/2018 >> LHC: oLAD 20, pLAD 20; oD1 20, oLCx 40, pRCA 99 >> PCI: DES to prox RCA // Echo 01/2018: EF 50-55; prob inf-lat and inf HK  . Chronic diastolic heart failure    Echocardiogram 07/2018: EF 55-60, grade 1 diastolic dysfunction, normal wall motion, normal GLS (-22.3), PASP 28  . Chronic foot pain    bunions, torn ligaments-on chronic pain medication  . CTS (carpal tunnel syndrome) 06/08/2014  . Depression   . High cholesterol   . Hypertension   . MDD (major depressive disorder) 10/02/2017  . Migraine   . Myocardial infarction ([facility]) 02/01/2018  . Pneumonia 1986   left lung  . RA (rheumatoid arthritis) (H[facility])     Past Surgical History:  Procedure Laterality Date  . BTL  2000  . CARPAL TUNNEL RELEASE Left   . CESAREAN SECTION  1978. 1983  . CORONARY STENT INTERVENTION N/A 01/27/2018   Procedure: CORONARY STENT INTERVENTION;  Surgeon: Kathleene Hazel, MD;  Location: [facility];  Service: Cardiovascular;   Laterality: N/A;  . eye implants    . FOOT SURGERY Left 2008  . front teeth removed after injury    . KNEE ARTHROSCOPY WITH LATERAL MENISECTOMY Left 02/02/2019   Procedure: LEFT KNEE ARTHROSCOPY, MEDIAL AND LATERAL MENISECTOMY, EXCISION OF LEFT LATERAL MENISCAL CYST;  Surgeon: Valeria Batman, MD;  Location: [facility];  Service: Orthopedics;  Laterality: Left;  . LEFT HEART CATH AND CORONARY ANGIOGRAPHY N/A 01/27/2018   Procedure: LEFT HEART CATH AND CORONARY ANGIOGRAPHY;  Surgeon: Kathleene Hazel, MD;  Location: [facility];  Service: Cardiovascular;  Laterality: N/A;  . pre cancerous lesion removed from forehead    . TOTAL ABDOMINAL HYSTERECTOMY W/ BILATERAL SALPINGOOPHORECTOMY  2002   Dr. Jackelyn Knife  . VAGINAL HYSTERECTOMY  2001    There were no vitals filed for this visit.  Subjective Assessment - 04/22/19 1451    Subjective  I've been working on step ups at home.  My knee pain is up a little bit.  It feels like the front of the kneecap slides and creaks.  Still some soreness where Baker's cyst it.  MD says I can wear the brace intermittently.    Pertinent History  RA, foot deformity with rheumatoid drift, LE edema    Limitations  Walking    How  long can you stand comfortably?  20 minutes- limited by RA in feet.    How long can you walk comfortably?  limited by knee and feet: 20-30 minutes    Currently in Pain?  Yes    Pain Score  1    was 3/10 this morning   Pain Location  Knee    Pain Orientation  Left;Anterior;Posterior    Pain Descriptors / Indicators  Aching;Tightness    Pain Type  Surgical pain    Aggravating Factors   stand/walk    Pain Relieving Factors  rest    Effect of Pain on Daily Activities  limited stand/walk activities                       [facility] Adult PT Treatment/Exercise - 04/22/19 0001      Knee/Hip Exercises: Aerobic   Nustep  L1 seat 7 x 8', PT present to discuss HEP and knee progress      Knee/Hip Exercises: Seated    Marching  Strengthening;20 reps    Abd/Adduction Limitations  ball squeeze 10x5 sec hold    Sit to [employer]  5 reps      Knee/Hip Exercises: Supine   Bridges  Strengthening;Both;10 reps    Straight Leg Raises  Strengthening;Left;2 sets;10 reps    Other Supine Knee/Hip Exercises  isometric hamstring curl into red ball 10x 5 sec holds      Manual Therapy   Manual Therapy  Passive ROM;Soft tissue mobilization;Joint mobilization    Joint Mobilization  patellar M/L/S/I glides Gr II-III on Lt    Soft tissue mobilization  retrograde massage Lt knee    Passive ROM  Lt knee flexion/extension x 15 reps               PT Short Term Goals - 04/22/19 1527      PT SHORT TERM GOAL #1   Title  be independent in initial HEP    Baseline  working on building more exercises and compliance, Pt reported improved compliance since last visit    Status  On-going      PT SHORT TERM GOAL #2   Title  improve LE strength to ascend steps with step-over-step and use of 1 rail    Baseline  doing this some of the time at home    Status  On-going        PT Long Term Goals - 03/31/19 1614      PT LONG TERM GOAL #1   Title  be independent in advanced HEP    Time  6    Period  Weeks    Status  New    Target Date  05/16/19      PT LONG TERM GOAL #2   Title  improve LE strength to ascend and descend steps with step-over-step gait    Time  6    Period  Weeks    Status  New    Target Date  05/16/19      PT LONG TERM GOAL #3   Title  perform 5x sit to stand in < or = to 20 seconds without UE support to reduce falls risk    Time  6    Period  Weeks    Status  New    Target Date  05/16/19      PT LONG TERM GOAL #4   Title  reduce FOTO to < or = to 39% limitation    Time  6    Period  Weeks    Status  New    Target Date  05/16/19            Plan - 04/22/19 1518    Clinical Impression Statement  Pt with pain up to 3/10 this morning but had gone down to 1/10 by appointment time.  PT  performed manual techniques to improve knee mobility including patellar mobs.  PT notes joint noise "creaking" but this isn't painful for Pt.  She was able to perform new ther ex including bridges and SLR today without difficutly.  PT introduced step downs from 2" step today with cue to place Rt heel toe pattern for step off.  Pt was able to perform but found it difficult and did not feel ready to try 4" step.  Pt is starting to use alternating pattern ascending stairs at home several times daily.  Continue along POC wtih ongoing assessment.    Comorbidities  Lt knee scope, CHF/MI, RA    Rehab Potential  Good    PT Frequency  2x / week    PT Duration  6 weeks    PT Treatment/Interventions  Cryotherapy;ADLs/Self Care Home Management;Electrical Stimulation;Moist Heat;Gait training;Stair training;Functional mobility training;Therapeutic activities;Therapeutic exercise;Balance training;Neuromuscular re-education;Manual techniques;Taping;Passive range of motion;Aquatic Therapy    PT Next Visit Plan  NuStep, manual therapy, step downs 2", sit to stand, SLRs, bridges, balance SLS tandem walking, update HEP if compliant with current HEP    PT Home Exercise Plan  Access Code: [email or web address]    Consulted and Agree with Plan of Care  Patient       Patient will benefit from skilled therapeutic intervention in order to improve the following deficits and impairments:     Visit Diagnosis: Muscle weakness (generalized)  Chronic pain of left knee  Other abnormalities of gait and mobility     Problem List Patient Active Problem List   Diagnosis Date Noted  . Hypoxia 03/05/2019  . Derangement of posterior horn of medial meniscus 02/02/2019  . Derangement of posterior horn of lateral meniscus 02/02/2019  . Meniscal cyst, unspecified laterality 12/01/2018  . Torn medial meniscus 12/01/2018  . Primary osteoarthritis of left knee 12/01/2018  . Coronary artery disease involving native coronary artery of native  heart without angina pectoris 09/14/2018  . Chronic diastolic (congestive) heart failure ([place])   . Chronic pain of left knee 05/15/2018  . Mass of left knee 05/15/2018  . Non-ST elevation (NSTEMI) myocardial infarction ([place])   . Chest pain 01/25/2018  . RA (rheumatoid arthritis) ([place]) 01/25/2018  . Hyperlipidemia 01/25/2018  . Hypokalemia 01/25/2018  . Depression with anxiety 01/25/2018  . Tobacco abuse 01/25/2018  . Lower extremity cellulitis 01/25/2018  . Benzodiazepine dependence ([place])   . MDD (major depressive disorder), recurrent severe, without psychosis ([place]) 10/02/2017  . CTS (carpal tunnel syndrome) 06/08/2014    Baruch Merl, PT 04/22/19 3:29 PM   [facility] [address], [place], [place], [address] Phone: [phone]   Fax:  [phone]  Name: RASHAWNA SCOLES MRN: [id number] Date of Birth: [birth year]

## 2019-04-27 ENCOUNTER — Encounter: Payer: Self-pay | Admitting: Physical Therapy

## 2019-04-27 ENCOUNTER — Ambulatory Visit: Payer: Medicare PPO | Attending: Orthopaedic Surgery | Admitting: Physical Therapy

## 2019-04-27 ENCOUNTER — Other Ambulatory Visit: Payer: Self-pay

## 2019-04-27 DIAGNOSIS — M6281 Muscle weakness (generalized): Secondary | ICD-10-CM | POA: Insufficient documentation

## 2019-04-27 DIAGNOSIS — G8929 Other chronic pain: Secondary | ICD-10-CM

## 2019-04-27 DIAGNOSIS — R2689 Other abnormalities of gait and mobility: Secondary | ICD-10-CM | POA: Diagnosis present

## 2019-04-27 DIAGNOSIS — M25562 Pain in left knee: Secondary | ICD-10-CM | POA: Insufficient documentation

## 2019-04-27 NOTE — Therapy (Signed)
[facility] [address] [place], [place], [address] Phone: [phone]   Fax:  [phone]  Physical Therapy Treatment  Patient Details  Name: Meghan Schwartz MRN: [id number] Date of Birth: 1950/12/23 Referring Provider (PT): Norlene Campbell, MD   Encounter Date: 04/27/2019  PT End of Session - 04/27/19 1516    Visit Number  6    Date for PT Re-Evaluation  05/16/19    Authorization Type  [employer]    Authorization Time Period  03/31/19-05/16/19    Authorization - Visit Number  6    Authorization - Number of Visits  12    PT Start Time  1445    PT Stop Time  1530    PT Time Calculation (min)  45 min    Activity Tolerance  Patient tolerated treatment well    Behavior During Therapy  [facility] for tasks assessed/performed       Past Medical History:  Diagnosis Date  . Allergic rhinitis   . Anxiety   . Benzodiazepine dependence (HCC)   . Bruxism   . CAD (coronary artery disease)    S/p NSTEMI 01/2018 >> LHC: oLAD 20, pLAD 20; oD1 20, oLCx 40, pRCA 99 >> PCI: DES to prox RCA // Echo 01/2018: EF 50-55; prob inf-lat and inf HK  . Chronic diastolic heart failure    Echocardiogram 07/2018: EF 55-60, grade 1 diastolic dysfunction, normal wall motion, normal GLS (-22.3), PASP 28  . Chronic foot pain    bunions, torn ligaments-on chronic pain medication  . CTS (carpal tunnel syndrome) 06/08/2014  . Depression   . High cholesterol   . Hypertension   . MDD (major depressive disorder) 10/02/2017  . Migraine   . Myocardial infarction (HCC) 02/01/2018  . Pneumonia 1986   left lung  . RA (rheumatoid arthritis) (HCC)     Past Surgical History:  Procedure Laterality Date  . BTL  2000  . CARPAL TUNNEL RELEASE Left   . CESAREAN SECTION  1978. 1983  . CORONARY STENT INTERVENTION N/A 01/27/2018   Procedure: CORONARY STENT INTERVENTION;  Surgeon: Kathleene Hazel, MD;  [facility];  Service: Cardiovascular;   Laterality: N/A;  . eye implants    . FOOT SURGERY Left 2008  . front teeth removed after injury    . KNEE ARTHROSCOPY WITH LATERAL MENISECTOMY Left 02/02/2019   Procedure: LEFT KNEE ARTHROSCOPY, MEDIAL AND LATERAL MENISECTOMY, EXCISION OF LEFT LATERAL MENISCAL CYST;  Surgeon: Valeria Batman, MD;  Location: [facility];  Service: Orthopedics;  Laterality: Left;  . LEFT HEART CATH AND CORONARY ANGIOGRAPHY N/A 01/27/2018   Procedure: LEFT HEART CATH AND CORONARY ANGIOGRAPHY;  Surgeon: Kathleene Hazel, MD;  [facility];  Service: Cardiovascular;  Laterality: N/A;  . pre cancerous lesion removed from forehead    . TOTAL ABDOMINAL HYSTERECTOMY W/ BILATERAL SALPINGOOPHORECTOMY  2002   Dr. Jackelyn Knife  . VAGINAL HYSTERECTOMY  2001    There were no vitals filed for this visit.  Subjective Assessment - 04/27/19 1450    Subjective  My knee is sore but just a 2/10.  Sore in front and back.  I've been doing more exercises at home so maybe that's why.    Pertinent History  RA, foot deformity with rheumatoid drift, LE edema    Limitations  Walking    How long can you stand comfortably?  20 minutes- limited by RA in feet.    How long can you  walk comfortably?  limited by knee and feet: 20-30 minutes    Currently in Pain?  Yes    Pain Score  2     Pain Location  Knee    Pain Orientation  Left;Anterior;Posterior    Pain Descriptors / Indicators  Aching    Pain Type  Surgical pain    Pain Onset  More than a month ago    Pain Frequency  Intermittent    Effect of Pain on Daily Activities  limited standing/walking activities                       [facility] Adult PT Treatment/Exercise - 04/27/19 0001      Knee/Hip Exercises: Aerobic   Nustep  L2 seat 8 x 6', PT present to discuss symptoms and HEP      Knee/Hip Exercises: Standing   Forward Step Up  Both;1 set;5 reps;Hand Hold: 1;Step Height: 6"    Forward Step Up Limitations  added to HEP      Knee/Hip Exercises:  Seated   Long Arc Quad  Strengthening;Both;2 sets;10 reps;Weights    Long Arc Quad Weight  1 lbs.    Marching  Strengthening;Both;20 reps;Weights    Marching Weights  1 lbs.    Abd/Adduction Limitations  red band ER 15 reps, ball squeeze 10x5 sec holds    Sit to [employer]  5 reps;without UE support      Manual Therapy   Joint Mobilization  patellar M/L/S/I glides Gr II-III on Lt    Soft tissue mobilization  retrograde massage Lt knee      Ankle Exercises: Seated   Heel Raises  Both;20 reps    Toe Raise  20 reps   both, with heel raises         Balance Exercises - 04/27/19 1459      Balance Exercises: Standing   SLS  Solid surface;Upper extremity support 1;2 reps;20 secs    Tandem Gait  Retro;Forward;Upper extremity support;5 reps          PT Short Term Goals - 04/27/19 1452      PT SHORT TERM GOAL #1   Title  be independent in initial HEP    Status  Achieved      PT SHORT TERM GOAL #2   Title  improve LE strength to ascend steps with step-over-step and use of 1 rail    Baseline  doing this some of the time at home    Status  On-going      PT SHORT TERM GOAL #3   Title  perform 5x sit to stand in < or = to 30 seconds without UE support to reduce falls risk    Baseline  13 sec    Status  Achieved      PT SHORT TERM GOAL #4   Title  report < or = to 5/10 Lt knee pain with standing and walking    Baseline  2-3/10    Status  Achieved        PT Long Term Goals - 03/31/19 1614      PT LONG TERM GOAL #1   Title  be independent in advanced HEP    Time  6    Period  Weeks    Status  New    Target Date  05/16/19      PT LONG TERM GOAL #2   Title  improve LE strength to ascend and descend steps with step-over-step gait  Time  6    Period  Weeks    Status  New    Target Date  05/16/19      PT LONG TERM GOAL #3   Title  perform 5x sit to stand in < or = to 20 seconds without UE support to reduce falls risk    Time  6    Period  Weeks    Status  New     Target Date  05/16/19      PT LONG TERM GOAL #4   Title  reduce FOTO to < or = to 39% limitation    Time  6    Period  Weeks    Status  New    Target Date  05/16/19            Plan - 04/27/19 1516    Clinical Impression Statement  Pt arrived with 2/10 aching in ant/post knee that improved with ther ex during visit.  She performed 5x sit to stand in 13 seconds today, meeting STG.  She intermittently performs alternating pattern going up stairs and consistently performs step to on way down.  She has increased her compliance with HEP.  PT progressed resistance for Lt knee today.  PT added tandem gait fwd/bwd and step ups to HEP for targeted balance and funtional stair training.  PT noted less swelling in posterior knee today during manual therapy with less definition around Baker's cyst.  Pt will continue to benefit from skilled PT along POC and is on track to meeting goals.    Comorbidities  Lt knee scope, CHF/MI, RA    Rehab Potential  Good    PT Frequency  2x / week    PT Duration  6 weeks    PT Treatment/Interventions  Cryotherapy;ADLs/Self Care Home Management;Electrical Stimulation;Moist Heat;Gait training;Stair training;Functional mobility training;Therapeutic activities;Therapeutic exercise;Balance training;Neuromuscular re-education;Manual techniques;Taping;Passive range of motion;Aquatic Therapy    PT Next Visit Plan  NuStep, add marching on foam, review tandem gait, SLS balance, step ups, try step downs from 2" step or 4" step as able    PT Home Exercise Plan  Access Code: [email or web address]    Consulted and Agree with Plan of Care  Patient       Patient will benefit from skilled therapeutic intervention in order to improve the following deficits and impairments:     Visit Diagnosis: Muscle weakness (generalized)  Chronic pain of left knee  Other abnormalities of gait and mobility     Problem List Patient Active Problem List   Diagnosis Date Noted  . Hypoxia 03/05/2019  .  Derangement of posterior horn of medial meniscus 02/02/2019  . Derangement of posterior horn of lateral meniscus 02/02/2019  . Meniscal cyst, unspecified laterality 12/01/2018  . Torn medial meniscus 12/01/2018  . Primary osteoarthritis of left knee 12/01/2018  . Coronary artery disease involving native coronary artery of native heart without angina pectoris 09/14/2018  . Chronic diastolic (congestive) heart failure ([place])   . Chronic pain of left knee 05/15/2018  . Mass of left knee 05/15/2018  . Non-ST elevation (NSTEMI) myocardial infarction ([place])   . Chest pain 01/25/2018  . RA (rheumatoid arthritis) ([place]) 01/25/2018  . Hyperlipidemia 01/25/2018  . Hypokalemia 01/25/2018  . Depression with anxiety 01/25/2018  . Tobacco abuse 01/25/2018  . Lower extremity cellulitis 01/25/2018  . Benzodiazepine dependence ([place])   . MDD (major depressive disorder), recurrent severe, without psychosis ([place]) 10/02/2017  . CTS (carpal tunnel syndrome) 06/08/2014    Baruch Merl,  PT 04/27/19 3:33 PM   [facility] [address] [place], [place], [address] Phone: [phone]   Fax:  [phone]  Name: Meghan Schwartz MRN: [id number] Date of Birth: Jun 25, 1950

## 2019-04-29 ENCOUNTER — Other Ambulatory Visit: Payer: Self-pay

## 2019-04-29 ENCOUNTER — Encounter: Payer: Self-pay | Admitting: Physical Therapy

## 2019-04-29 ENCOUNTER — Ambulatory Visit: Payer: Medicare PPO | Admitting: Physical Therapy

## 2019-04-29 DIAGNOSIS — M25562 Pain in left knee: Secondary | ICD-10-CM

## 2019-04-29 DIAGNOSIS — R2689 Other abnormalities of gait and mobility: Secondary | ICD-10-CM

## 2019-04-29 DIAGNOSIS — G8929 Other chronic pain: Secondary | ICD-10-CM

## 2019-04-29 DIAGNOSIS — M6281 Muscle weakness (generalized): Secondary | ICD-10-CM

## 2019-04-29 NOTE — Therapy (Signed)
[facility] [address] [place], [place], [address] Phone: [phone]   Fax:  [phone]  Physical Therapy Treatment  Patient Details  Name: Meghan Schwartz MRN: [id number] Date of Birth: [birth year] Referring Provider (PT): Norlene Campbell, MD   Encounter Date: 04/29/2019  PT End of Session - 04/29/19 1522    Visit Number  7    Date for PT Re-Evaluation  05/16/19    Authorization Type  [employer]    Authorization Time Period  03/31/19-05/16/19    Authorization - Visit Number  7    Authorization - Number of Visits  12    PT Start Time  1445    PT Stop Time  1525    PT Time Calculation (min)  40 min    Activity Tolerance  Patient tolerated treatment well    Behavior During Therapy  [facility] for tasks assessed/performed       Past Medical History:  Diagnosis Date  . Allergic rhinitis   . Anxiety   . Benzodiazepine dependence (HCC)   . Bruxism   . CAD (coronary artery disease)    S/p NSTEMI 01/2018 >> LHC: oLAD 20, pLAD 20; oD1 20, oLCx 40, pRCA 99 >> PCI: DES to prox RCA // Echo 01/2018: EF 50-55; prob inf-lat and inf HK  . Chronic diastolic heart failure    Echocardiogram 07/2018: EF 55-60, grade 1 diastolic dysfunction, normal wall motion, normal GLS (-22.3), PASP 28  . Chronic foot pain    bunions, torn ligaments-on chronic pain medication  . CTS (carpal tunnel syndrome) 06/08/2014  . Depression   . High cholesterol   . Hypertension   . MDD (major depressive disorder) 10/02/2017  . Migraine   . Myocardial infarction (HCC) 02/01/2018  . Pneumonia 1986   left lung  . RA (rheumatoid arthritis) (HCC)     Past Surgical History:  Procedure Laterality Date  . BTL  2000  . CARPAL TUNNEL RELEASE Left   . CESAREAN SECTION  1978. 1983  . CORONARY STENT INTERVENTION N/A 01/27/2018   Procedure: CORONARY STENT INTERVENTION;  Surgeon: Kathleene Hazel, MD;  [facility];  Service: Cardiovascular;   Laterality: N/A;  . eye implants    . FOOT SURGERY Left 2008  . front teeth removed after injury    . KNEE ARTHROSCOPY WITH LATERAL MENISECTOMY Left 02/02/2019   Procedure: LEFT KNEE ARTHROSCOPY, MEDIAL AND LATERAL MENISECTOMY, EXCISION OF LEFT LATERAL MENISCAL CYST;  Surgeon: Valeria Batman, MD;  Location: [facility];  Service: Orthopedics;  Laterality: Left;  . LEFT HEART CATH AND CORONARY ANGIOGRAPHY N/A 01/27/2018   Procedure: LEFT HEART CATH AND CORONARY ANGIOGRAPHY;  Surgeon: Kathleene Hazel, MD;  [facility];  Service: Cardiovascular;  Laterality: N/A;  . pre cancerous lesion removed from forehead    . TOTAL ABDOMINAL HYSTERECTOMY W/ BILATERAL SALPINGOOPHORECTOMY  2002   Dr. Jackelyn Knife  . VAGINAL HYSTERECTOMY  2001    There were no vitals filed for this visit.  Subjective Assessment - 04/29/19 1449    Subjective  My knee is doing well, fluctuates with pain between 1-3/10 and is in back of knee.    Pertinent History  RA, foot deformity with rheumatoid drift, LE edema    Limitations  Walking    How long can you stand comfortably?  20 minutes- limited by RA in feet.    How long can you walk comfortably?  limited by knee and feet: 20-30 minutes  Currently in Pain?  Yes    Pain Score  1     Pain Location  Knee    Pain Orientation  Left;Posterior    Pain Descriptors / Indicators  Aching    Pain Type  Surgical pain    Pain Onset  More than a month ago    Pain Frequency  Intermittent    Aggravating Factors   stand/walk    Pain Relieving Factors  rest    Effect of Pain on Daily Activities  limited standing/walking activties                       [facility] Adult PT Treatment/Exercise - 04/29/19 0001      Knee/Hip Exercises: Aerobic   Nustep  L1 seat 7 x 7', PT present to discuss progress      Knee/Hip Exercises: Seated   Long Arc Quad  Strengthening;Left;Weights;1 set;15 reps    Long Arc Quad Weight  1 lbs.      Knee/Hip Exercises: Supine    Short Arc Quad Sets  Strengthening;Left;2 sets;15 reps    Short Arc Quad Sets Limitations  with ball squeeze    Bridges with Clamshell  Strengthening   clamshell only 15 reps green tband   Straight Leg Raises  Strengthening;Left;10 reps;2 sets    Other Supine Knee/Hip Exercises  hamstring curls feet on red ball supine x 15 reps    Other Supine Knee/Hip Exercises  glut sets 10x3 sec in hooklying      Manual Therapy   Joint Mobilization  patellar M/L/S/I glides Gr II-III on Lt    Soft tissue mobilization  retrograde massage Lt knee, posterior knee               PT Short Term Goals - 04/27/19 1452      PT SHORT TERM GOAL #1   Title  be independent in initial HEP    Status  Achieved      PT SHORT TERM GOAL #2   Title  improve LE strength to ascend steps with step-over-step and use of 1 rail    Baseline  doing this some of the time at home    Status  On-going      PT SHORT TERM GOAL #3   Title  perform 5x sit to stand in < or = to 30 seconds without UE support to reduce falls risk    Baseline  13 sec    Status  Achieved      PT SHORT TERM GOAL #4   Title  report < or = to 5/10 Lt knee pain with standing and walking    Baseline  2-3/10    Status  Achieved        PT Long Term Goals - 04/29/19 1518      PT LONG TERM GOAL #1   Title  be independent in advanced HEP    Status  On-going      PT LONG TERM GOAL #2   Title  improve LE strength to ascend and descend steps with step-over-step gait    Baseline  alternating pattern going up most of the time, step to coming down    Status  On-going      PT LONG TERM GOAL #3   Title  perform 5x sit to stand in < or = to 20 seconds without UE support to reduce falls risk    Baseline  13 sec    Status  Achieved  Plan - 04/29/19 1519    Clinical Impression Statement  Pt is making good progress in Lt LE strength.  She is tolerating increased levels of ther ex within sessions.  PT notes less swelling in  posterior knee and improved Lt patellar glides in all directions.  Pt was able to perform 2 sets of 10 SLRs today.  She uses alternating pattern most of the time going up the stairs but uses step to consistently coming down the stairs.  PT will continue to monitor exercise tolerance and progress strength as tolerated.    Comorbidities  Lt knee scope, CHF/MI, RA    Rehab Potential  Good    PT Frequency  2x / week    PT Duration  6 weeks    PT Treatment/Interventions  Cryotherapy;ADLs/Self Care Home Management;Electrical Stimulation;Moist Heat;Gait training;Stair training;Functional mobility training;Therapeutic activities;Therapeutic exercise;Balance training;Neuromuscular re-education;Manual techniques;Taping;Passive range of motion;Aquatic Therapy    PT Next Visit Plan  continue SLRs, SAQs and LAQs with ball squeeze, work on step ups/downs, do FOTO    PT Home Exercise Plan  Access Code: [email or web address]    Consulted and Agree with Plan of Care  Patient       Patient will benefit from skilled therapeutic intervention in order to improve the following deficits and impairments:     Visit Diagnosis: Muscle weakness (generalized)  Chronic pain of left knee  Other abnormalities of gait and mobility     Problem List Patient Active Problem List   Diagnosis Date Noted  . Hypoxia 03/05/2019  . Derangement of posterior horn of medial meniscus 02/02/2019  . Derangement of posterior horn of lateral meniscus 02/02/2019  . Meniscal cyst, unspecified laterality 12/01/2018  . Torn medial meniscus 12/01/2018  . Primary osteoarthritis of left knee 12/01/2018  . Coronary artery disease involving native coronary artery of native heart without angina pectoris 09/14/2018  . Chronic diastolic (congestive) heart failure ([place])   . Chronic pain of left knee 05/15/2018  . Mass of left knee 05/15/2018  . Non-ST elevation (NSTEMI) myocardial infarction ([place])   . Chest pain 01/25/2018  . RA (rheumatoid arthritis)  ([place]) 01/25/2018  . Hyperlipidemia 01/25/2018  . Hypokalemia 01/25/2018  . Depression with anxiety 01/25/2018  . Tobacco abuse 01/25/2018  . Lower extremity cellulitis 01/25/2018  . Benzodiazepine dependence ([place])   . MDD (major depressive disorder), recurrent severe, without psychosis ([place]) 10/02/2017  . CTS (carpal tunnel syndrome) 06/08/2014    Baruch Merl, PT 04/29/19 3:25 PM   [facility] [address] [place], [place], [address] Phone: [phone]   Fax:  [phone]  Name: Meghan Schwartz MRN: [id number] Date of Birth: Mar 17, 1950

## 2019-05-04 ENCOUNTER — Ambulatory Visit: Payer: Medicare PPO | Admitting: Physical Therapy

## 2019-05-04 ENCOUNTER — Other Ambulatory Visit: Payer: Self-pay

## 2019-05-04 ENCOUNTER — Encounter: Payer: Self-pay | Admitting: Physical Therapy

## 2019-05-04 DIAGNOSIS — M25562 Pain in left knee: Secondary | ICD-10-CM

## 2019-05-04 DIAGNOSIS — G8929 Other chronic pain: Secondary | ICD-10-CM

## 2019-05-04 DIAGNOSIS — M6281 Muscle weakness (generalized): Secondary | ICD-10-CM

## 2019-05-04 DIAGNOSIS — R2689 Other abnormalities of gait and mobility: Secondary | ICD-10-CM

## 2019-05-04 NOTE — Therapy (Signed)
[facility] [address] [place], [place], [address] Phone: [phone]   Fax:  [phone]  Physical Therapy Treatment  Patient Details  Name: Meghan Schwartz MRN: [id number] Date of Birth: 1950/05/01 Referring Provider (PT): Norlene Campbell, MD   Encounter Date: 05/04/2019  PT End of Session - 05/04/19 1503    Visit Number  8    Date for PT Re-Evaluation  05/16/19    Authorization Type  [employer]    Authorization Time Period  03/31/19-05/16/19    Authorization - Visit Number  8    Authorization - Number of Visits  12    PT Start Time  1445    PT Stop Time  1530    PT Time Calculation (min)  45 min    Activity Tolerance  Patient tolerated treatment well    Behavior During Therapy  [facility] for tasks assessed/performed       Past Medical History:  Diagnosis Date  . Allergic rhinitis   . Anxiety   . Benzodiazepine dependence (HCC)   . Bruxism   . CAD (coronary artery disease)    S/p NSTEMI 01/2018 >> LHC: oLAD 20, pLAD 20; oD1 20, oLCx 40, pRCA 99 >> PCI: DES to prox RCA // Echo 01/2018: EF 50-55; prob inf-lat and inf HK  . Chronic diastolic heart failure    Echocardiogram 07/2018: EF 55-60, grade 1 diastolic dysfunction, normal wall motion, normal GLS (-22.3), PASP 28  . Chronic foot pain    bunions, torn ligaments-on chronic pain medication  . CTS (carpal tunnel syndrome) 06/08/2014  . Depression   . High cholesterol   . Hypertension   . MDD (major depressive disorder) 10/02/2017  . Migraine   . Myocardial infarction (HCC) 02/01/2018  . Pneumonia 1986   left lung  . RA (rheumatoid arthritis) (HCC)     Past Surgical History:  Procedure Laterality Date  . BTL  2000  . CARPAL TUNNEL RELEASE Left   . CESAREAN SECTION  1978. 1983  . CORONARY STENT INTERVENTION N/A 01/27/2018   Procedure: CORONARY STENT INTERVENTION;  Surgeon: Kathleene Hazel, MD;  [facility]: [facility];  Service: Cardiovascular;   Laterality: N/A;  . eye implants    . FOOT SURGERY Left 2008  . front teeth removed after injury    . KNEE ARTHROSCOPY WITH LATERAL MENISECTOMY Left 02/02/2019   Procedure: LEFT KNEE ARTHROSCOPY, MEDIAL AND LATERAL MENISECTOMY, EXCISION OF LEFT LATERAL MENISCAL CYST;  Surgeon: Valeria Batman, MD;  Location: [facility];  Service: Orthopedics;  Laterality: Left;  . LEFT HEART CATH AND CORONARY ANGIOGRAPHY N/A 01/27/2018   Procedure: LEFT HEART CATH AND CORONARY ANGIOGRAPHY;  Surgeon: Kathleene Hazel, MD;  Location: [facility];  Service: Cardiovascular;  Laterality: N/A;  . pre cancerous lesion removed from forehead    . TOTAL ABDOMINAL HYSTERECTOMY W/ BILATERAL SALPINGOOPHORECTOMY  2002   Dr. Jackelyn Knife  . VAGINAL HYSTERECTOMY  2001    There were no vitals filed for this visit.  Subjective Assessment - 05/04/19 1451    Subjective  I like the balance exercises.  They are helping.  Knee is about the same 1-3/10.  I am feeling more strength coming in my Lt leg.    Pertinent History  RA, foot deformity with rheumatoid drift, LE edema    Limitations  Walking    How long can you stand comfortably?  20 minutes- limited by RA in feet.    How long can  you walk comfortably?  limited by knee and feet: 20-30 minutes    Currently in Pain?  Yes    Pain Score  1     Pain Location  Knee    Pain Orientation  Left;Posterior    Pain Descriptors / Indicators  Aching    Pain Type  Surgical pain    Pain Onset  More than a month ago    Pain Frequency  Intermittent    Aggravating Factors   stand/walk                       [facility] Adult PT Treatment/Exercise - 05/04/19 0001      Knee/Hip Exercises: Aerobic   Nustep  L2 x 6', PT present to discuss progress      Knee/Hip Exercises: Standing   Step Down  Step Height: 4";10 reps;Step Height: 2";Left;Hand Hold: 2      Knee/Hip Exercises: Seated   Sit to Sand  5 reps;with UE support   hands on thighs     Knee/Hip Exercises:  Supine   Short Arc Quad Sets  Strengthening;Both;2 sets;10 reps    Short Arc Quad Sets Limitations  2#    Bridges with Clamshell  Strengthening   clamshell only 15 reps green tband   Straight Leg Raises  Strengthening;Left;2 sets;10 reps    Other Supine Knee/Hip Exercises  ball squeeze 10x3 sec    Other Supine Knee/Hip Exercises  glut sets 10x3 sec in hooklying          Balance Exercises - 05/04/19 1509      Balance Exercises: Standing   SLS  Eyes open;Upper extremity support 2;2 reps;10 secs;Foam/compliant surface   on rebounder   Step Ups  Forward;UE support 2;6 inch   5 each, with contralateral march to 2nd step   Marching  Foam/compliant surface;Upper extremity assist 2;20 reps    Other Standing Exercises  weight shifting on rebounder 1' each: stagger stance and square stance   bil UE support         PT Short Term Goals - 05/04/19 1532      PT SHORT TERM GOAL #1   Title  be independent in initial HEP    Status  Achieved      PT SHORT TERM GOAL #2   Title  improve LE strength to ascend steps with step-over-step and use of 1 rail    Status  Achieved      PT SHORT TERM GOAL #3   Title  perform 5x sit to stand in < or = to 30 seconds without UE support to reduce falls risk    Status  Achieved      PT SHORT TERM GOAL #4   Title  report < or = to 5/10 Lt knee pain with standing and walking    Status  Achieved        PT Long Term Goals - 05/04/19 1533      PT LONG TERM GOAL #1   Title  be independent in advanced HEP    Status  On-going      PT LONG TERM GOAL #2   Title  improve LE strength to ascend and descend steps with step-over-step gait    Baseline  alternating pattern going up most of the time, step to coming down    Status  On-going      PT LONG TERM GOAL #3   Title  perform 5x sit to stand in < or =  to 20 seconds without UE support to reduce falls risk    Status  Achieved      PT LONG TERM GOAL #4   Title  reduce FOTO to < or = to 39% limitation     Status  On-going            Plan - 05/04/19 1524    Clinical Impression Statement  Pt continues to tolerate increased ther ex with low grade pain in posterior knee.  Pt reports feeling stronger especially on stairs at home.  PT progressed balance on rebounder today and step down height from 2" to 4" step as pt continues to avoid alternating step pattern for descending stairs.  Pt was able to demo Lt step down from 6" step after practicing with 2" and 4".  PT notes Lt knee joint crepitus with ROM during ther ex but this is not painful to Pt.  Pt notes heightened posterior knee pain with Lt knee flexion positioning.  PT alternates activities to give flexion based ther ex relief which helps keep pain below 3/10 during session.  Pt will continue to benefit from skilled PT along POC with focus on Lt knee stability, strength and function.    Comorbidities  Lt knee scope, CHF/MI, RA    Rehab Potential  Good    PT Frequency  2x / week    PT Duration  6 weeks    PT Treatment/Interventions  Cryotherapy;ADLs/Self Care Home Management;Electrical Stimulation;Moist Heat;Gait training;Stair training;Functional mobility training;Therapeutic activities;Therapeutic exercise;Balance training;Neuromuscular re-education;Manual techniques;Taping;Passive range of motion;Aquatic Therapy    PT Next Visit Plan  continue SLRs, SAQs and LAQs with ball squeeze, work on step ups/downs, balance on rebounder, tandem stance/gait fwd/bwd, SLS, do FOTO    PT Home Exercise Plan  Access Code: [email or web address]    Consulted and Agree with Plan of Care  Patient       Patient will benefit from skilled therapeutic intervention in order to improve the following deficits and impairments:     Visit Diagnosis: Muscle weakness (generalized)  Chronic pain of left knee  Other abnormalities of gait and mobility     Problem List Patient Active Problem List   Diagnosis Date Noted  . Hypoxia 03/05/2019  . Derangement of posterior horn  of medial meniscus 02/02/2019  . Derangement of posterior horn of lateral meniscus 02/02/2019  . Meniscal cyst, unspecified laterality 12/01/2018  . Torn medial meniscus 12/01/2018  . Primary osteoarthritis of left knee 12/01/2018  . Coronary artery disease involving native coronary artery of native heart without angina pectoris 09/14/2018  . Chronic diastolic (congestive) heart failure ([place])   . Chronic pain of left knee 05/15/2018  . Mass of left knee 05/15/2018  . Non-ST elevation (NSTEMI) myocardial infarction ([place])   . Chest pain 01/25/2018  . RA (rheumatoid arthritis) ([place]) 01/25/2018  . Hyperlipidemia 01/25/2018  . Hypokalemia 01/25/2018  . Depression with anxiety 01/25/2018  . Tobacco abuse 01/25/2018  . Lower extremity cellulitis 01/25/2018  . Benzodiazepine dependence ([place])   . MDD (major depressive disorder), recurrent severe, without psychosis ([place]) 10/02/2017  . CTS (carpal tunnel syndrome) 06/08/2014    Baruch Merl, PT 05/04/19 3:35 PM   [facility] [address] [place], [place], [address] Phone: [phone]   Fax:  [phone]  Name: TEKISHA DARCEY MRN: [id number] Date of Birth: 02/08/1951

## 2019-05-06 ENCOUNTER — Ambulatory Visit: Payer: Medicare PPO | Admitting: Physical Therapy

## 2019-05-06 ENCOUNTER — Other Ambulatory Visit: Payer: Self-pay

## 2019-05-06 ENCOUNTER — Encounter: Payer: Self-pay | Admitting: Physical Therapy

## 2019-05-06 DIAGNOSIS — G8929 Other chronic pain: Secondary | ICD-10-CM

## 2019-05-06 DIAGNOSIS — R2689 Other abnormalities of gait and mobility: Secondary | ICD-10-CM

## 2019-05-06 DIAGNOSIS — M6281 Muscle weakness (generalized): Secondary | ICD-10-CM | POA: Diagnosis not present

## 2019-05-06 NOTE — Therapy (Signed)
[facility] [address], [place], [place], [address] Phone: [phone]   Fax:  [phone]  Physical Therapy Treatment  Patient Details  Name: Meghan Schwartz MRN: [id number] Date of Birth: Dec 07, 1950 Referring Provider (PT): Joni Fears, MD   Encounter Date: 05/06/2019  PT End of Session - 05/06/19 1448    Visit Number  9    Date for PT Re-Evaluation  05/16/19    Authorization Type  [employer]    Authorization Time Period  03/31/19-05/16/19    Authorization - Visit Number  9    Authorization - Number of Visits  12    PT Start Time  1448    PT Stop Time  1530    PT Time Calculation (min)  42 min    Activity Tolerance  Patient tolerated treatment well    Behavior During Therapy  [facility] for tasks assessed/performed       Past Medical History:  Diagnosis Date  . Allergic rhinitis   . Anxiety   . Benzodiazepine dependence ([place])   . Bruxism   . CAD (coronary artery disease)    S/p NSTEMI 01/2018 >> LHC: oLAD 20, pLAD 20; oD1 20, oLCx 40, pRCA 99 >> PCI: DES to prox RCA // Echo 01/2018: EF 50-55; prob inf-lat and inf HK  . Chronic diastolic heart failure    Echocardiogram 07/2018: EF 73-22, grade 1 diastolic dysfunction, normal wall motion, normal GLS (-22.3), PASP 28  . Chronic foot pain    bunions, torn ligaments-on chronic pain medication  . CTS (carpal tunnel syndrome) 06/08/2014  . Depression   . High cholesterol   . Hypertension   . MDD (major depressive disorder) 10/02/2017  . Migraine   . Myocardial infarction ([place]) 02/01/2018  . Pneumonia 1986   left lung  . RA (rheumatoid arthritis) ([place])     Past Surgical History:  Procedure Laterality Date  . BTL  2000  . CARPAL TUNNEL RELEASE Left   . [place]  . CORONARY STENT INTERVENTION N/A 01/27/2018   Procedure: CORONARY STENT INTERVENTION;  Surgeon: Burnell Blanks, MD;  Location: [place] [facility];  Service: Cardiovascular;   Laterality: N/A;  . eye implants    . FOOT SURGERY Left 2008  . front teeth removed after injury    . KNEE ARTHROSCOPY WITH LATERAL MENISECTOMY Left 02/02/2019   Procedure: LEFT KNEE ARTHROSCOPY, MEDIAL AND LATERAL MENISECTOMY, EXCISION OF LEFT LATERAL MENISCAL CYST;  Surgeon: Garald Balding, MD;  Location: [facility];  Service: Orthopedics;  Laterality: Left;  . LEFT HEART CATH AND CORONARY ANGIOGRAPHY N/A 01/27/2018   Procedure: LEFT HEART CATH AND CORONARY ANGIOGRAPHY;  Surgeon: Burnell Blanks, MD;  Location: [place] [facility];  Service: Cardiovascular;  Laterality: N/A;  . pre cancerous lesion removed from forehead    . TOTAL ABDOMINAL HYSTERECTOMY W/ BILATERAL SALPINGOOPHORECTOMY  2002   Dr. Willis Modena  . VAGINAL HYSTERECTOMY  2001    There were no vitals filed for this visit.  Subjective Assessment - 05/06/19 1450    Subjective  front of Lt knee has been sore since last visit.    Pertinent History  RA, foot deformity with rheumatoid drift, LE edema    Limitations  Walking    How long can you stand comfortably?  20 minutes- limited by RA in feet.    How long can you walk comfortably?  limited by knee and feet: 20-30 minutes    Currently in Pain?  Yes    Pain Score  2     Pain Location  Knee    Pain Orientation  Left;Anterior;Posterior    Pain Descriptors / Indicators  Aching;Sore    Pain Type  Surgical pain    Pain Onset  More than a month ago    Pain Frequency  Intermittent    Aggravating Factors   stand/walk    Pain Relieving Factors  rest                       [facility] Adult PT Treatment/Exercise - 05/06/19 0001      Knee/Hip Exercises: Standing   Hip Abduction  Stengthening;Both;2 sets;Knee straight;5 reps    Abduction Limitations  on foam pad for balance challenge    Hip Extension  Stengthening;Both;2 sets;Knee straight;5 reps    Extension Limitations  on foam pad for balance challenge, PT cued Pt not to lock out Lt knee    Forward Step Up   Left;1 set;5 reps;Step Height: 6";Hand Hold: 2    Functional Squat  10 reps    Functional Squat Limitations  bil UE support edge of treadmill      Knee/Hip Exercises: Seated   Ball Squeeze  5" hold x 10 reps    Marching  Strengthening;Both;15 reps    Marching Limitations  green band around thighs    Abduction/Adduction   Strengthening;Both;15 reps    Abd/Adduction Limitations  green band clam 2x15          Balance Exercises - 05/06/19 1505      Balance Exercises: Standing   SLS  Eyes open;Upper extremity support 2;2 reps;10 secs;Foam/compliant surface   on rebounder   Marching  Foam/compliant surface;Upper extremity assist 2;20 reps    Other Standing Exercises  weight shifting on rebounder 1' each: stagger stance and square stance   bil UE support         PT Short Term Goals - 05/04/19 1532      PT SHORT TERM GOAL #1   Title  be independent in initial HEP    Status  Achieved      PT SHORT TERM GOAL #2   Title  improve LE strength to ascend steps with step-over-step and use of 1 rail    Status  Achieved      PT SHORT TERM GOAL #3   Title  perform 5x sit to stand in < or = to 30 seconds without UE support to reduce falls risk    Status  Achieved      PT SHORT TERM GOAL #4   Title  report < or = to 5/10 Lt knee pain with standing and walking    Status  Achieved        PT Long Term Goals - 05/04/19 1533      PT LONG TERM GOAL #1   Title  be independent in advanced HEP    Status  On-going      PT LONG TERM GOAL #2   Title  improve LE strength to ascend and descend steps with step-over-step gait    Baseline  alternating pattern going up most of the time, step to coming down    Status  On-going      PT LONG TERM GOAL #3   Title  perform 5x sit to stand in < or = to 20 seconds without UE support to reduce falls risk    Status  Achieved      PT  LONG TERM GOAL #4   Title  reduce FOTO to < or = to 39% limitation    Status  On-going            Plan -  05/06/19 1510    Clinical Impression Statement  Pt with some new anterior knee pain which begain after last session.  PT closely monitored during ther ex during which Pt reported improved pain levels.  PT noted decreased size of Lt Baker's cyst but with tenderness reported by Pt on palpation.  PT also noted fair patallar mobs S/I direction and performed patellar glides with improved mobility.  Pt continues to have difficulty descending stairs without first practicing from 4" step.  She is able to step down from 6" step after practicing from 4".  Pt is making good progress with balance and Lt LE strength and stability following Lt knee surgery.  Continue along POC with ongoing assessment and progression as tol.    Comorbidities  Lt knee scope, CHF/MI, RA    Examination-Activity Limitations  Stand;Stairs;Squat;Transfers    Rehab Potential  Good    PT Frequency  2x / week    PT Duration  6 weeks    PT Treatment/Interventions  Cryotherapy;ADLs/Self Care Home Management;Electrical Stimulation;Moist Heat;Gait training;Stair training;Functional mobility training;Therapeutic activities;Therapeutic exercise;Balance training;Neuromuscular re-education;Manual techniques;Taping;Passive range of motion;Aquatic Therapy    PT Next Visit Plan  10th visit PN next visit, continue step downs, SLRs, Lt LE stability and strength, balance    PT Home Exercise Plan  Access Code: [email or web address]    Consulted and Agree with Plan of Care  Patient       Patient will benefit from skilled therapeutic intervention in order to improve the following deficits and impairments:     Visit Diagnosis: Muscle weakness (generalized)  Chronic pain of left knee  Other abnormalities of gait and mobility     Problem List Patient Active Problem List   Diagnosis Date Noted  . Hypoxia 03/05/2019  . Derangement of posterior horn of medial meniscus 02/02/2019  . Derangement of posterior horn of lateral meniscus 02/02/2019  . Meniscal cyst,  unspecified laterality 12/01/2018  . Torn medial meniscus 12/01/2018  . Primary osteoarthritis of left knee 12/01/2018  . Coronary artery disease involving native coronary artery of native heart without angina pectoris 09/14/2018  . Chronic diastolic (congestive) heart failure (HCC)   . Chronic pain of left knee 05/15/2018  . Mass of left knee 05/15/2018  . Non-ST elevation (NSTEMI) myocardial infarction (HCC)   . Chest pain 01/25/2018  . RA (rheumatoid arthritis) (HCC) 01/25/2018  . Hyperlipidemia 01/25/2018  . Hypokalemia 01/25/2018  . Depression with anxiety 01/25/2018  . Tobacco abuse 01/25/2018  . Lower extremity cellulitis 01/25/2018  . Benzodiazepine dependence (HCC)   . MDD (major depressive disorder), recurrent severe, without psychosis (HCC) 10/02/2017  . CTS (carpal tunnel syndrome) 06/08/2014    Morton Peters, PT 05/06/19 3:30 PM   [facility] [address] [place], [place], [address] Phone: [phone]   Fax:  [phone]  Name: Meghan Schwartz MRN: [id number] Date of Birth: 03-Apr-1950

## 2019-05-10 ENCOUNTER — Other Ambulatory Visit: Payer: Self-pay | Admitting: Physician Assistant

## 2019-05-10 DIAGNOSIS — E782 Mixed hyperlipidemia: Secondary | ICD-10-CM

## 2019-05-10 DIAGNOSIS — I10 Essential (primary) hypertension: Secondary | ICD-10-CM

## 2019-05-10 NOTE — Telephone Encounter (Signed)
Pt calling stating that she no longer takes furosemide. Pt now takes torsemide daily and would Meghan Schwartz, [place] to change her medication potassium 20 mg tablets to daily as well. Please address

## 2019-05-11 ENCOUNTER — Ambulatory Visit: Payer: Medicare PPO | Admitting: Physical Therapy

## 2019-05-11 ENCOUNTER — Other Ambulatory Visit: Payer: Self-pay

## 2019-05-11 ENCOUNTER — Encounter: Payer: Self-pay | Admitting: Physical Therapy

## 2019-05-11 DIAGNOSIS — M6281 Muscle weakness (generalized): Secondary | ICD-10-CM | POA: Diagnosis not present

## 2019-05-11 DIAGNOSIS — G8929 Other chronic pain: Secondary | ICD-10-CM

## 2019-05-11 DIAGNOSIS — R2689 Other abnormalities of gait and mobility: Secondary | ICD-10-CM

## 2019-05-11 NOTE — Therapy (Signed)
[facility] [address] [place], [place], [address] Phone: [phone]   Fax:  [phone]  Physical Therapy Treatment  Patient Details  Name: Meghan Schwartz MRN: [id number] Date of Birth: [birth year] Referring Provider (PT): Norlene Campbell, MD  Progress Note Reporting Period 03/31/19 to 05/11/19  See note below for Objective Data and Assessment of Progress/Goals.       Encounter Date: 05/11/2019  PT End of Session - 05/11/19 1454    Visit Number  10    Date for PT Re-Evaluation  05/16/19    Authorization Type  [employer]    Authorization Time Period  03/31/19-05/16/19    Authorization - Visit Number  10    Authorization - Number of Visits  12    PT Start Time  1450    PT Stop Time  1530    PT Time Calculation (min)  40 min    Activity Tolerance  Patient tolerated treatment well    Behavior During Therapy  WFL for tasks assessed/performed       Past Medical History:  Diagnosis Date  . Allergic rhinitis   . Anxiety   . Benzodiazepine dependence (HCC)   . Bruxism   . CAD (coronary artery disease)    S/p NSTEMI 01/2018 >> LHC: oLAD 20, pLAD 20; oD1 20, oLCx 40, pRCA 99 >> PCI: DES to prox RCA // Echo 01/2018: EF 50-55; prob inf-lat and inf HK  . Chronic diastolic heart failure    Echocardiogram 07/2018: EF 55-60, grade 1 diastolic dysfunction, normal wall motion, normal GLS (-22.3), PASP 28  . Chronic foot pain    bunions, torn ligaments-on chronic pain medication  . CTS (carpal tunnel syndrome) 06/08/2014  . Depression   . High cholesterol   . Hypertension   . MDD (major depressive disorder) 10/02/2017  . Migraine   . Myocardial infarction (HCC) 02/01/2018  . Pneumonia 1986   left lung  . RA (rheumatoid arthritis) (HCC)     Past Surgical History:  Procedure Laterality Date  . BTL  2000  . CARPAL TUNNEL RELEASE Left   . CESAREAN SECTION  1978. 1983  . CORONARY STENT INTERVENTION N/A 01/27/2018   Procedure: CORONARY STENT INTERVENTION;  Surgeon: Kathleene Hazel, MD;  [facility];  Service: Cardiovascular;  Laterality: N/A;  . eye implants    . FOOT SURGERY Left 2008  . front teeth removed after injury    . KNEE ARTHROSCOPY WITH LATERAL MENISECTOMY Left 02/02/2019   Procedure: LEFT KNEE ARTHROSCOPY, MEDIAL AND LATERAL MENISECTOMY, EXCISION OF LEFT LATERAL MENISCAL CYST;  Surgeon: Valeria Batman, MD;  Location: [facility];  Service: Orthopedics;  Laterality: Left;  . LEFT HEART CATH AND CORONARY ANGIOGRAPHY N/A 01/27/2018   Procedure: LEFT HEART CATH AND CORONARY ANGIOGRAPHY;  Surgeon: Kathleene Hazel, MD;  [facility]: [facility];  Service: Cardiovascular;  Laterality: N/A;  . pre cancerous lesion removed from forehead    . TOTAL ABDOMINAL HYSTERECTOMY W/ BILATERAL SALPINGOOPHORECTOMY  2002   Dr. Jackelyn Knife  . VAGINAL HYSTERECTOMY  2001    There were no vitals filed for this visit.  Subjective Assessment - 05/11/19 1451    Subjective  I've been working on going up and down the stairs and it's getting better.  Knee pain is low today - a 1/10.    Pertinent History  RA, foot deformity with rheumatoid drift, LE edema    Limitations  Walking    How long  can you stand comfortably?  20 minutes- limited by RA in feet.    How long can you walk comfortably?  limited by knee and feet: 20-30 minutes    Currently in Pain?  Yes    Pain Score  1     Pain Location  Knee    Pain Orientation  Left;Posterior    Pain Descriptors / Indicators  Aching;Sore    Pain Type  Surgical pain    Pain Onset  More than a month ago    Pain Frequency  Intermittent    Aggravating Factors   stand/walk    Pain Relieving Factors  rest    Effect of Pain on Daily Activities  limited standing/walking activities         [facility] PT Assessment - 05/11/19 0001      Assessment   Medical Diagnosis  meniscal cyst, Lt   Lt knee artrhoscopy 02/02/19   Referring Provider (PT)  Norlene Campbell, MD    Onset Date/Surgical Date  02/02/19      Observation/Other Assessments   Focus on Therapeutic Outcomes (FOTO)   45% limitation      Strength   Overall Strength Comments  bil hip strength 5/5 for ER, abduction, IR, 4+/5 for hip abduction and extension, ankle DF 5/5, knee strength is 4+/5 with pain on resisted knee flexion on Lt      Palpation   Patella mobility  fair patellar mobs S/I, WNL med/lat      Transfers   Transfers  Sit to Stand    Five time sit to stand comments   13    Stand to Sit  With upper extremity assist      Ambulation/Gait   Stairs  Yes    Stair Management Technique  Alternating pattern;Step to pattern    Height of Stairs  6    Gait Comments  alternating going up, step to coming down, working on this      Functional Gait  Assessment   Gait assessed   No                   [facility] Adult PT Treatment/Exercise - 05/11/19 0001      Knee/Hip Exercises: Standing   Knee Flexion  Strengthening;Left;2 sets;10 reps    Knee Flexion Limitations  1# ankle weight, PT cued Pt to avoid hip flexion with knee flexion    Forward Step Up  Left;1 set;5 reps;Step Height: 6";Hand Hold: 2    Forward Step Up Limitations  toe tap 2nd step with contralateral foot    Step Down  Step Height: 2";Step Height: 4";Step Height: 6";Hand Hold: 1;5 reps;Both    Step Down Limitations  PT cued Lt hip flexion to assist Lt knee flexion to reduce anterior knee pressure/pain      Knee/Hip Exercises: Supine   Bridges  Strengthening;Both;10 reps    Straight Leg Raises  Strengthening;Left;2 sets;10 reps    Knee Flexion  Strengthening          Balance Exercises - 05/11/19 1502      Balance Exercises: Standing   Other Standing Exercises  weight shifting on rebounder 1' each: stagger stance and square stance   bil UE support         PT Short Term Goals - 05/04/19 1532      PT SHORT TERM GOAL #1   Title  be independent in initial HEP    Status  Achieved      PT  SHORT  TERM GOAL #2   Title  improve LE strength to ascend steps with step-over-step and use of 1 rail    Status  Achieved      PT SHORT TERM GOAL #3   Title  perform 5x sit to stand in < or = to 30 seconds without UE support to reduce falls risk    Status  Achieved      PT SHORT TERM GOAL #4   Title  report < or = to 5/10 Lt knee pain with standing and walking    Status  Achieved        PT Long Term Goals - 05/11/19 1454      PT LONG TERM GOAL #1   Title  be independent in advanced HEP    Baseline  progressing, limited somewhat by RA, need for joint protection    Status  On-going      PT LONG TERM GOAL #2   Title  improve LE strength to ascend and descend steps with step-over-step gait    Baseline  alternating pattern going up most of the time, step to coming down    Status  On-going      PT LONG TERM GOAL #3   Title  perform 5x sit to stand in < or = to 20 seconds without UE support to reduce falls risk    Status  Achieved      PT LONG TERM GOAL #4   Title  reduce FOTO to < or = to 39% limitation    Status  On-going            Plan - 05/11/19 1521    Clinical Impression Statement  Pt has made great progress in strength of Lt LE.  Knee strength is 4+/5 with pain on knee flexion resistance, hip strength ranges from 4+/5 to 5/5, and ankle DF is improved to 5/5.  FOTO score has reduced from 62% to 45% limited.  Pt continues to struggle with alternating pattern when descending stairs which PT worked on with Pt today.  She was able to demo from 6" step with report of Lt anterior knee pain, which improved with cue to flex hip slightly to reduce demand on knee.  Pt continues to ambulate slowly secondary to RA impacts on multiple joints.  Pain ranges from 1-3/10 in Lt knee.  She will continue to benefit from skilled PT along POC for ongoing functional strength.    Comorbidities  Lt knee scope, CHF/MI, RA    Examination-Activity Limitations  Stand;Stairs;Squat;Transfers    PT  Frequency  2x / week    PT Duration  6 weeks    PT Treatment/Interventions  Cryotherapy;ADLs/Self Care Home Management;Electrical Stimulation;Moist Heat;Gait training;Stair training;Functional mobility training;Therapeutic activities;Therapeutic exercise;Balance training;Neuromuscular re-education;Manual techniques;Taping;Passive range of motion;Aquatic Therapy    PT Next Visit Plan  continue step downs, hamstring strength, balance, knee stability, glut/abd strength    PT Home Exercise Plan  Access Code: [email or web address]    Consulted and Agree with Plan of Care  Patient       Patient will benefit from skilled therapeutic intervention in order to improve the following deficits and impairments:     Visit Diagnosis: Muscle weakness (generalized)  Chronic pain of left knee  Other abnormalities of gait and mobility     Problem List Patient Active Problem List   Diagnosis Date Noted  . Hypoxia 03/05/2019  . Derangement of posterior horn of medial meniscus 02/02/2019  . Derangement of posterior horn of lateral meniscus  02/02/2019  . Meniscal cyst, unspecified laterality 12/01/2018  . Torn medial meniscus 12/01/2018  . Primary osteoarthritis of left knee 12/01/2018  . Coronary artery disease involving native coronary artery of native heart without angina pectoris 09/14/2018  . Chronic diastolic (congestive) heart failure (HCC)   . Chronic pain of left knee 05/15/2018  . Mass of left knee 05/15/2018  . Non-ST elevation (NSTEMI) myocardial infarction (HCC)   . Chest pain 01/25/2018  . RA (rheumatoid arthritis) (HCC) 01/25/2018  . Hyperlipidemia 01/25/2018  . Hypokalemia 01/25/2018  . Depression with anxiety 01/25/2018  . Tobacco abuse 01/25/2018  . Lower extremity cellulitis 01/25/2018  . Benzodiazepine dependence (HCC)   . MDD (major depressive disorder), recurrent severe, without psychosis (HCC) 10/02/2017  . CTS (carpal tunnel syndrome) 06/08/2014    Morton Peters, PT 05/11/19  3:31 PM    [facility] [address] [place], [place], [address] Phone: [phone]   Fax:  [phone]  Name: HOLLYANNE SCHLOESSER MRN: [id number] Date of Birth: August 10, 1950

## 2019-05-11 NOTE — Telephone Encounter (Signed)
Ok to send prescription for potassium in? Does she need to take it daily with Torsemide? Or still take three times a week?

## 2019-05-11 NOTE — Telephone Encounter (Signed)
How has she been taking both? Torsemide once daily? K+ once daily or 3 times a week? We should get a BMET to see if her K+ is normal with what she has been currently doing.  Please arrange. Tereso Newcomer, PA-C    05/11/2019 5:23 PM

## 2019-05-12 NOTE — Telephone Encounter (Signed)
I called and spoke with patient, she states that she is taking Torsemide 20 mg once a day with the potassium 20 mEq once a day. She is just taking 1 tablet once a day for both Torsemide and Potassium. She will come in for blood work tomorrow for a [employer]. She states that since she ran out of the prescription potassium she is taking over the counter 99 mg potassium 1 tablet once a day. Patient would like to know if you want her on Torsemide daily like the hospital advised or every other day?

## 2019-05-12 NOTE — Telephone Encounter (Signed)
If she is stable on once daily, she can continue that for now.  Let's refill her K+ first and continue on prescription K+ replacement plus the Torsemide and get a BMET in 1 week instead of tomorrow. If her creatinine and K+ are normal on the the BMET next week, she can continue on the same dosages. Tereso Newcomer, PA-C    05/12/2019 2:36 PM

## 2019-05-12 NOTE — Telephone Encounter (Signed)
I called and left patient a message to call back. 

## 2019-05-13 ENCOUNTER — Other Ambulatory Visit: Payer: Self-pay

## 2019-05-13 ENCOUNTER — Other Ambulatory Visit: Payer: Medicare PPO

## 2019-05-13 ENCOUNTER — Telehealth: Payer: Self-pay | Admitting: Physician Assistant

## 2019-05-13 ENCOUNTER — Telehealth: Payer: Self-pay

## 2019-05-13 ENCOUNTER — Encounter: Payer: Self-pay | Admitting: Physical Therapy

## 2019-05-13 ENCOUNTER — Ambulatory Visit: Payer: Medicare PPO | Admitting: Physical Therapy

## 2019-05-13 DIAGNOSIS — M25562 Pain in left knee: Secondary | ICD-10-CM

## 2019-05-13 DIAGNOSIS — M6281 Muscle weakness (generalized): Secondary | ICD-10-CM | POA: Diagnosis not present

## 2019-05-13 DIAGNOSIS — R2689 Other abnormalities of gait and mobility: Secondary | ICD-10-CM

## 2019-05-13 DIAGNOSIS — G8929 Other chronic pain: Secondary | ICD-10-CM

## 2019-05-13 MED ORDER — POTASSIUM CHLORIDE CRYS ER 20 MEQ PO TBCR: 20.0000 meq | EXTENDED_RELEASE_TABLET | Freq: Every day | ORAL | 3 refills | Status: DC

## 2019-05-13 MED ORDER — POTASSIUM CHLORIDE CRYS ER 20 MEQ PO TBCR: 20.0000 meq | EXTENDED_RELEASE_TABLET | ORAL | 3 refills | Status: DC

## 2019-05-13 NOTE — Telephone Encounter (Signed)
Pt advised and she will come to the office 05/19/19 for her BMET.

## 2019-05-13 NOTE — Telephone Encounter (Signed)
Spoke with the pt and she is not sure if she needs labs.. she received a call that she did not need to come... will forward to Tereso Newcomer PA/ Irving Burton to clarify if the pt needs the repeat BMET ordered yesterday in Epic.

## 2019-05-13 NOTE — Telephone Encounter (Signed)
Patient states she received a call today from Meghan Schwartz to change her lab appt.  She would like to speak to [facility] before she does that, she also has a few questions for her.

## 2019-05-13 NOTE — Therapy (Signed)
[facility] [address] [place], [place], [address] Phone: [phone]   Fax:  [phone]  Physical Therapy Treatment  Patient Details  Name: Meghan Schwartz MRN: [id number] Date of Birth: 1951/02/11 Referring Provider (PT): Norlene Campbell, MD   Encounter Date: 05/13/2019  PT End of Session - 05/13/19 1455    Visit Number  11    Date for PT Re-Evaluation  05/16/19    Authorization Type  [employer]    Authorization Time Period  03/31/19-05/16/19    Authorization - Visit Number  11    Authorization - Number of Visits  12    PT Start Time  1445    PT Stop Time  1530    PT Time Calculation (min)  45 min    Activity Tolerance  Patient tolerated treatment well    Behavior During Therapy  [facility] for tasks assessed/performed       Past Medical History:  Diagnosis Date  . Allergic rhinitis   . Anxiety   . Benzodiazepine dependence (HCC)   . Bruxism   . CAD (coronary artery disease)    S/p NSTEMI 01/2018 >> LHC: oLAD 20, pLAD 20; oD1 20, oLCx 40, pRCA 99 >> PCI: DES to prox RCA // Echo 01/2018: EF 50-55; prob inf-lat and inf HK  . Chronic diastolic heart failure    Echocardiogram 07/2018: EF 55-60, grade 1 diastolic dysfunction, normal wall motion, normal GLS (-22.3), PASP 28  . Chronic foot pain    bunions, torn ligaments-on chronic pain medication  . CTS (carpal tunnel syndrome) 06/08/2014  . Depression   . High cholesterol   . Hypertension   . MDD (major depressive disorder) 10/02/2017  . Migraine   . Myocardial infarction (HCC) 02/01/2018  . Pneumonia 1986   left lung  . RA (rheumatoid arthritis) (HCC)     Past Surgical History:  Procedure Laterality Date  . BTL  2000  . CARPAL TUNNEL RELEASE Left   . CESAREAN SECTION  1978. 1983  . CORONARY STENT INTERVENTION N/A 01/27/2018   Procedure: CORONARY STENT INTERVENTION;  Surgeon: Kathleene Hazel, MD;  [facility]: [facility];  Service: Cardiovascular;   Laterality: N/A;  . eye implants    . FOOT SURGERY Left 2008  . front teeth removed after injury    . KNEE ARTHROSCOPY WITH LATERAL MENISECTOMY Left 02/02/2019   Procedure: LEFT KNEE ARTHROSCOPY, MEDIAL AND LATERAL MENISECTOMY, EXCISION OF LEFT LATERAL MENISCAL CYST;  Surgeon: Valeria Batman, MD;  Location: [facility];  Service: Orthopedics;  Laterality: Left;  . LEFT HEART CATH AND CORONARY ANGIOGRAPHY N/A 01/27/2018   Procedure: LEFT HEART CATH AND CORONARY ANGIOGRAPHY;  Surgeon: Kathleene Hazel, MD;  [facility]: [facility];  Service: Cardiovascular;  Laterality: N/A;  . pre cancerous lesion removed from forehead    . TOTAL ABDOMINAL HYSTERECTOMY W/ BILATERAL SALPINGOOPHORECTOMY  2002   Dr. Jackelyn Knife  . VAGINAL HYSTERECTOMY  2001    There were no vitals filed for this visit.  Subjective Assessment - 05/13/19 1452    Subjective  I've been able to go down the stairs alternating pattern with holding onto the rail.  It increases the knee pain a little bit but I can do it.  I am pleased with my progress - PT has really helped.    Pertinent History  RA, foot deformity with rheumatoid drift, LE edema    Limitations  Walking    How long can you stand comfortably?  20 minutes- limited by RA in feet.    How long can you walk comfortably?  limited by knee and feet: 20-30 minutes    Currently in Pain?  Yes    Pain Score  1     Pain Location  Knee    Pain Orientation  Left;Posterior    Pain Descriptors / Indicators  Aching    Pain Type  Surgical pain    Pain Onset  More than a month ago    Pain Frequency  Intermittent    Aggravating Factors   stand/walk    Pain Relieving Factors  rest    Effect of Pain on Daily Activities  limited standing/walking activities                       [facility] Adult PT Treatment/Exercise - 05/13/19 0001      Knee/Hip Exercises: Aerobic   Nustep  L2 x 7', PT present to discuss progress      Knee/Hip Exercises: Standing   Knee Flexion   Strengthening;Left;2 sets;10 reps    Knee Flexion Limitations  1# ankle weight, PT cued Pt to avoid hip flexion with knee flexion    Hip Abduction  Stengthening;Both;1 set;10 reps;Knee straight    Abduction Limitations  1#    Hip Extension  Stengthening;Both;1 set;10 reps;Knee straight    Extension Limitations  1# on foam with contralateral leg    Other Standing Knee Exercises  Pt demo 4 flight stairs with one rail up/down x 2 rounds with alternating pattern      Knee/Hip Exercises: Supine   Bridges  Strengthening;2 sets;10 reps    Straight Leg Raises  Strengthening;Both;2 sets;10 reps    Straight Leg Raises Limitations  1# for 2nd set          Balance Exercises - 05/13/19 1516      Balance Exercises: Standing   Step Over Hurdles / Cones  pool noodle step overs, PT cued to avoid lateral whip of leg, "march step over with heel strike", 15 reps    Marching  Foam/compliant surface;20 reps   open palms on TM for balance challenge   Other Standing Exercises  weight shifting on rebounder 1' each: stagger stance and square stance   bil UE support         PT Short Term Goals - 05/04/19 1532      PT SHORT TERM GOAL #1   Title  be independent in initial HEP    Status  Achieved      PT SHORT TERM GOAL #2   Title  improve LE strength to ascend steps with step-over-step and use of 1 rail    Status  Achieved      PT SHORT TERM GOAL #3   Title  perform 5x sit to stand in < or = to 30 seconds without UE support to reduce falls risk    Status  Achieved      PT SHORT TERM GOAL #4   Title  report < or = to 5/10 Lt knee pain with standing and walking    Status  Achieved        PT Long Term Goals - 05/13/19 1529      PT LONG TERM GOAL #2   Title  improve LE strength to ascend and descend steps with step-over-step gait    Status  Achieved            Plan - 05/13/19 1521  Clinical Impression Statement  Pt reported ability to descend stairs at home with alternting pattern  since last visit.  Pain level consistently at 1/10 which can increase intermittently to 2/10 or 3/10 with certain exercises but it doesn't last.  Pt reports feeling much stronger and having more confidence in balance.  PT introduced step overs with gait today during which Pt needed cue to march vs lateral whip leg which improved immediately upon cueing.  Pt tolerating increased resistance and sets with existing strength regimen.  She is on track to meet all goals by the end of current certification period.    Comorbidities  Lt knee scope, CHF/MI, RA    Examination-Activity Limitations  Stand;Stairs;Squat;Transfers    PT Frequency  2x / week    PT Duration  6 weeks    PT Treatment/Interventions  Cryotherapy;ADLs/Self Care Home Management;Electrical Stimulation;Moist Heat;Gait training;Stair training;Functional mobility training;Therapeutic activities;Therapeutic exercise;Balance training;Neuromuscular re-education;Manual techniques;Taping;Passive range of motion;Aquatic Therapy    PT Next Visit Plan  continue gait challenges with step overs, gait with head turns, compliant surface with standing LE strength, step downs, hamstring strength, balance, knee stability, glut/abd strength    PT Home Exercise Plan  Access Code: [email or web address]    Consulted and Agree with Plan of Care  Patient       Patient will benefit from skilled therapeutic intervention in order to improve the following deficits and impairments:     Visit Diagnosis: Muscle weakness (generalized)  Chronic pain of left knee  Other abnormalities of gait and mobility     Problem List Patient Active Problem List   Diagnosis Date Noted  . Hypoxia 03/05/2019  . Derangement of posterior horn of medial meniscus 02/02/2019  . Derangement of posterior horn of lateral meniscus 02/02/2019  . Meniscal cyst, unspecified laterality 12/01/2018  . Torn medial meniscus 12/01/2018  . Primary osteoarthritis of left knee 12/01/2018  . Coronary artery  disease involving native coronary artery of native heart without angina pectoris 09/14/2018  . Chronic diastolic (congestive) heart failure (HCC)   . Chronic pain of left knee 05/15/2018  . Mass of left knee 05/15/2018  . Non-ST elevation (NSTEMI) myocardial infarction (HCC)   . Chest pain 01/25/2018  . RA (rheumatoid arthritis) (HCC) 01/25/2018  . Hyperlipidemia 01/25/2018  . Hypokalemia 01/25/2018  . Depression with anxiety 01/25/2018  . Tobacco abuse 01/25/2018  . Lower extremity cellulitis 01/25/2018  . Benzodiazepine dependence (HCC)   . MDD (major depressive disorder), recurrent severe, without psychosis (HCC) 10/02/2017  . CTS (carpal tunnel syndrome) 06/08/2014   Morton Peters, PT 05/13/19 3:32 PM   [facility] [address] [place], [place], [address] Phone: [phone]   Fax:  [phone]  Name: TYLA BURGNER MRN: [id number] Date of Birth: [birth year]

## 2019-05-13 NOTE — Telephone Encounter (Signed)
Called the pt and LM for her to pick up her RX at [facility] on [address] for her K to be taken qd per Tereso Newcomer.   Spoke with the [facility], Ebony at [employer] to clarify pt RX for her K sig should be qd and not the directions sent in earlier today in error.    Pt to have repeat labs 05/19/19.

## 2019-05-13 NOTE — Telephone Encounter (Signed)
LMTCB

## 2019-05-13 NOTE — Telephone Encounter (Signed)
She was originally scheduled for a lab today.  But we realized she needed her K+ refilled.  So I recommended, getting back on her K+ supplement and checking a BMET in 1 week (instead of today). Thanks, Tereso Newcomer, PA-C    05/13/2019 12:31 PM

## 2019-05-13 NOTE — Telephone Encounter (Signed)
Called and spoke with the [facility], Ebony at [facility] to alter recent RX sent in for her K to change the sig to once a day.

## 2019-05-18 ENCOUNTER — Ambulatory Visit: Payer: Medicare PPO | Admitting: Physical Therapy

## 2019-05-18 ENCOUNTER — Other Ambulatory Visit: Payer: Self-pay

## 2019-05-18 ENCOUNTER — Encounter: Payer: Self-pay | Admitting: Physical Therapy

## 2019-05-18 DIAGNOSIS — M6281 Muscle weakness (generalized): Secondary | ICD-10-CM

## 2019-05-18 DIAGNOSIS — R2689 Other abnormalities of gait and mobility: Secondary | ICD-10-CM

## 2019-05-18 DIAGNOSIS — M25562 Pain in left knee: Secondary | ICD-10-CM

## 2019-05-18 DIAGNOSIS — G8929 Other chronic pain: Secondary | ICD-10-CM

## 2019-05-18 NOTE — Therapy (Signed)
[facility] [address], [place], [place], [address] Phone: [phone]   Fax:  [phone]  Physical Therapy Treatment  Patient Details  Name: Meghan Schwartz MRN: [id number] Date of Birth: June 24, 1950 Referring Provider (PT): Joni Fears, MD   Encounter Date: 05/18/2019  PT End of Session - 05/18/19 1458    Visit Number  12    Date for PT Re-Evaluation  05/16/19    Authorization Type  [employer]    Authorization Time Period  03/31/19-05/16/19    Authorization - Visit Number  1    Authorization - Number of Visits  4    PT Start Time  1062    PT Stop Time  1530    PT Time Calculation (min)  38 min    Activity Tolerance  Patient tolerated treatment well    Behavior During Therapy  [facility] for tasks assessed/performed       Past Medical History:  Diagnosis Date  . Allergic rhinitis   . Anxiety   . Benzodiazepine dependence ([place])   . Bruxism   . CAD (coronary artery disease)    S/p NSTEMI 01/2018 >> LHC: oLAD 20, pLAD 20; oD1 20, oLCx 40, pRCA 99 >> PCI: DES to prox RCA // Echo 01/2018: EF 50-55; prob inf-lat and inf HK  . Chronic diastolic heart failure    Echocardiogram 07/2018: EF 69-48, grade 1 diastolic dysfunction, normal wall motion, normal GLS (-22.3), PASP 28  . Chronic foot pain    bunions, torn ligaments-on chronic pain medication  . CTS (carpal tunnel syndrome) 06/08/2014  . Depression   . High cholesterol   . Hypertension   . MDD (major depressive disorder) 10/02/2017  . Migraine   . Myocardial infarction ([place]) 02/01/2018  . Pneumonia 1986   left lung  . RA (rheumatoid arthritis) ([place])     Past Surgical History:  Procedure Laterality Date  . BTL  2000  . CARPAL TUNNEL RELEASE Left   . [place]  . CORONARY STENT INTERVENTION N/A 01/27/2018   Procedure: CORONARY STENT INTERVENTION;  Surgeon: Burnell Blanks, MD;  Location: [facility];  Service: Cardiovascular;   Laterality: N/A;  . eye implants    . FOOT SURGERY Left 2008  . front teeth removed after injury    . KNEE ARTHROSCOPY WITH LATERAL MENISECTOMY Left 02/02/2019   Procedure: LEFT KNEE ARTHROSCOPY, MEDIAL AND LATERAL MENISECTOMY, EXCISION OF LEFT LATERAL MENISCAL CYST;  Surgeon: Garald Balding, MD;  Location: [facility];  Service: Orthopedics;  Laterality: Left;  . LEFT HEART CATH AND CORONARY ANGIOGRAPHY N/A 01/27/2018   Procedure: LEFT HEART CATH AND CORONARY ANGIOGRAPHY;  Surgeon: Burnell Blanks, MD;  Location: [place] [facility];  Service: Cardiovascular;  Laterality: N/A;  . pre cancerous lesion removed from forehead    . TOTAL ABDOMINAL HYSTERECTOMY W/ BILATERAL SALPINGOOPHORECTOMY  2002   Dr. Willis Modena  . VAGINAL HYSTERECTOMY  2001    There were no vitals filed for this visit.  Subjective Assessment - 05/18/19 1455    Subjective  the knee is a little more sore from trying to do the alternating steps.  I've cut that back out for several days.    Pertinent History  RA, foot deformity with rheumatoid drift, LE edema    Limitations  Walking    How long can you stand comfortably?  20 minutes- limited by RA in feet.    How long can you walk comfortably?  limited by knee and feet: 20-30 minutes    Currently in Pain?  Yes    Pain Score  2     Pain Location  Knee    Pain Orientation  Posterior;Left    Pain Descriptors / Indicators  Aching    Pain Type  Surgical pain    Pain Onset  More than a month ago    Pain Frequency  Intermittent    Aggravating Factors   stand/walk, alternating descending stair pattern    Pain Relieving Factors  rest                       [facility] Adult PT Treatment/Exercise - 05/18/19 0001      Exercises   Exercises  Knee/Hip      Knee/Hip Exercises: Aerobic   Nustep  L3 x 8', PT present to discuss symptoms and stair strategy      Knee/Hip Exercises: Standing   Other Standing Knee Exercises  high knee marching x 20 for knee and hip ROM       Knee/Hip Exercises: Supine   Straight Leg Raises  AROM;Strengthening;Left;1 set;15 reps      Manual Therapy   Soft tissue mobilization  Lt knee massage posterior knee          Balance Exercises - 05/18/19 1503      Balance Exercises: Standing   Tandem Stance  Eyes open;Upper extremity support 2;2 reps;20 secs    Standing, One Foot on a Step  6 inch;Eyes open;10 secs;2 reps   bil, hovered hands with intermittent use, mostly ind   Gait with Head Turns  Forward;5 reps    Tandem Gait  Forward;Retro;4 reps;Upper extremity support    Step Over Hurdles / Cones  pool noodle step overs, PT cued to avoid lateral whip of leg, "march step over with heel strike", 15 reps    Other Standing Exercises  cone weaving 5x4 laps          PT Short Term Goals - 05/04/19 1532      PT SHORT TERM GOAL #1   Title  be independent in initial HEP    Status  Achieved      PT SHORT TERM GOAL #2   Title  improve LE strength to ascend steps with step-over-step and use of 1 rail    Status  Achieved      PT SHORT TERM GOAL #3   Title  perform 5x sit to stand in < or = to 30 seconds without UE support to reduce falls risk    Status  Achieved      PT SHORT TERM GOAL #4   Title  report < or = to 5/10 Lt knee pain with standing and walking    Status  Achieved        PT Long Term Goals - 05/13/19 1529      PT LONG TERM GOAL #2   Title  improve LE strength to ascend and descend steps with step-over-step gait    Status  Achieved            Plan - 05/18/19 1459    Clinical Impression Statement  Pt reported some increased aching in posterior knee she feels was exacerbated trying the alternating pattern when descending stairs.  PT discussed that she can use step to pattern as needed for safety/comfort.  PT performed manual PT to address knee soreness today and focused on dynamic gait and balance.  Pt displays  much improved strength surrounding knee.  Continue along POC with d/c next week.     Comorbidities  Lt knee scope, CHF/MI, RA    Examination-Activity Limitations  Stand;Stairs;Squat;Transfers    Examination-Participation Restrictions  Community Activity;Driving;Shop;Meal Prep    Rehab Potential  Good    PT Frequency  2x / week    PT Duration  6 weeks    PT Treatment/Interventions  Cryotherapy;ADLs/Self Care Home Management;Electrical Stimulation;Moist Heat;Gait training;Stair training;Functional mobility training;Therapeutic activities;Therapeutic exercise;Balance training;Neuromuscular re-education;Manual techniques;Taping;Passive range of motion;Aquatic Therapy    PT Next Visit Plan  continue gait challenges with step overs, gait with head turns, compliant surface with standing LE strength, step downs, hamstring strength, balance, knee stability, glut/abd strength    PT Home Exercise Plan  Access Code: [email or web address]    Consulted and Agree with Plan of Care  Patient       Patient will benefit from skilled therapeutic intervention in order to improve the following deficits and impairments:     Visit Diagnosis: Muscle weakness (generalized)  Chronic pain of left knee  Other abnormalities of gait and mobility     Problem List Patient Active Problem List   Diagnosis Date Noted  . Hypoxia 03/05/2019  . Derangement of posterior horn of medial meniscus 02/02/2019  . Derangement of posterior horn of lateral meniscus 02/02/2019  . Meniscal cyst, unspecified laterality 12/01/2018  . Torn medial meniscus 12/01/2018  . Primary osteoarthritis of left knee 12/01/2018  . Coronary artery disease involving native coronary artery of native heart without angina pectoris 09/14/2018  . Chronic diastolic (congestive) heart failure ([facility])   . Chronic pain of left knee 05/15/2018  . Mass of left knee 05/15/2018  . Non-ST elevation (NSTEMI) myocardial infarction ([facility])   . Chest pain 01/25/2018  . RA (rheumatoid arthritis) ([facility]) 01/25/2018  . Hyperlipidemia 01/25/2018  . Hypokalemia  01/25/2018  . Depression with anxiety 01/25/2018  . Tobacco abuse 01/25/2018  . Lower extremity cellulitis 01/25/2018  . Benzodiazepine dependence ([facility])   . MDD (major depressive disorder), recurrent severe, without psychosis ([facility]) 10/02/2017  . CTS (carpal tunnel syndrome) 06/08/2014    Morton Peters, PT 05/18/19 3:30 PM   [facility] [address], [address] [place], [place], [address] Phone: [phone]   Fax:  [phone]  Name: Meghan Schwartz MRN: [id number] Date of Birth: 07/30/1950

## 2019-05-19 ENCOUNTER — Other Ambulatory Visit: Payer: Medicare PPO

## 2019-05-20 ENCOUNTER — Other Ambulatory Visit: Payer: Self-pay

## 2019-05-20 ENCOUNTER — Ambulatory Visit: Payer: Medicare PPO | Admitting: Physical Therapy

## 2019-05-20 ENCOUNTER — Other Ambulatory Visit: Payer: Medicare PPO | Admitting: *Deleted

## 2019-05-20 ENCOUNTER — Encounter: Payer: Self-pay | Admitting: Physical Therapy

## 2019-05-20 DIAGNOSIS — R2689 Other abnormalities of gait and mobility: Secondary | ICD-10-CM

## 2019-05-20 DIAGNOSIS — G8929 Other chronic pain: Secondary | ICD-10-CM

## 2019-05-20 DIAGNOSIS — M6281 Muscle weakness (generalized): Secondary | ICD-10-CM | POA: Diagnosis not present

## 2019-05-20 DIAGNOSIS — M25562 Pain in left knee: Secondary | ICD-10-CM

## 2019-05-20 NOTE — Therapy (Signed)
[facility] [address], [address] [place], [place], [address] Phone: [phone]   Fax:  [phone]  Physical Therapy Treatment  Patient Details  Name: Meghan Schwartz MRN: [id number] Date of Birth: 06/20/1950 Referring Provider (PT): Norlene Campbell, MD   Encounter Date: 05/20/2019  PT End of Session - 05/20/19 1455    Visit Number  13    Date for PT Re-Evaluation  05/16/19    Authorization Type  [employer]    Authorization Time Period  03/31/19-05/16/19    Authorization - Visit Number  2    Authorization - Number of Visits  4    PT Start Time  1450    PT Stop Time  1530    PT Time Calculation (min)  40 min    Activity Tolerance  Patient tolerated treatment well    Behavior During Therapy  [facility] for tasks assessed/performed       Past Medical History:  Diagnosis Date  . Allergic rhinitis   . Anxiety   . Benzodiazepine dependence ([facility])   . Bruxism   . CAD (coronary artery disease)    S/p NSTEMI 01/2018 >> LHC: oLAD 20, pLAD 20; oD1 20, oLCx 40, pRCA 99 >> PCI: DES to prox RCA // Echo 01/2018: EF 50-55; prob inf-lat and inf HK  . Chronic diastolic heart failure    Echocardiogram 07/2018: EF 55-60, grade 1 diastolic dysfunction, normal wall motion, normal GLS (-22.3), PASP 28  . Chronic foot pain    bunions, torn ligaments-on chronic pain medication  . CTS (carpal tunnel syndrome) 06/08/2014  . Depression   . High cholesterol   . Hypertension   . MDD (major depressive disorder) 10/02/2017  . Migraine   . Myocardial infarction ([facility]) 02/01/2018  . Pneumonia 1986   left lung  . RA (rheumatoid arthritis) ([facility])     Past Surgical History:  Procedure Laterality Date  . BTL  2000  . CARPAL TUNNEL RELEASE Left   . CESAREAN SECTION  1978. 1983  . CORONARY STENT INTERVENTION N/A 01/27/2018   Procedure: CORONARY STENT INTERVENTION;  Surgeon: Kathleene Hazel, MD;  [facility];  Service: Cardiovascular;   Laterality: N/A;  . eye implants    . FOOT SURGERY Left 2008  . front teeth removed after injury    . KNEE ARTHROSCOPY WITH LATERAL MENISECTOMY Left 02/02/2019   Procedure: LEFT KNEE ARTHROSCOPY, MEDIAL AND LATERAL MENISECTOMY, EXCISION OF LEFT LATERAL MENISCAL CYST;  Surgeon: Valeria Batman, MD;  Location: [facility];  Service: Orthopedics;  Laterality: Left;  . LEFT HEART CATH AND CORONARY ANGIOGRAPHY N/A 01/27/2018   Procedure: LEFT HEART CATH AND CORONARY ANGIOGRAPHY;  Surgeon: Kathleene Hazel, MD;  Location: [facility];  Service: Cardiovascular;  Laterality: N/A;  . pre cancerous lesion removed from forehead    . TOTAL ABDOMINAL HYSTERECTOMY W/ BILATERAL SALPINGOOPHORECTOMY  2002   Dr. Jackelyn Knife  . VAGINAL HYSTERECTOMY  2001    There were no vitals filed for this visit.  Subjective Assessment - 05/20/19 1454    Subjective  Everything hurts, maybe the weather or from keeping my granddaughter. 3/10 in knee.    Pertinent History  RA, foot deformity with rheumatoid drift, LE edema    Limitations  Walking    How long can you stand comfortably?  20 minutes- limited by RA in feet.    How long can you walk comfortably?  limited by knee and feet: 20-30 minutes  Currently in Pain?  Yes    Pain Score  3     Pain Location  Knee    Pain Orientation  Left;Posterior    Pain Descriptors / Indicators  Aching    Pain Type  Surgical pain    Pain Onset  More than a month ago    Pain Frequency  Intermittent    Aggravating Factors   descending stairs alt pattern    Pain Relieving Factors  rest    Effect of Pain on Daily Activities  stand and walk > 10-15 min                       [facility] Adult PT Treatment/Exercise - 05/20/19 0001      Knee/Hip Exercises: Aerobic   Nustep  L3 x 8', PT present to discuss progress and HEP      Knee/Hip Exercises: Standing   Knee Flexion  AROM;Left;15 reps    Knee Flexion Limitations  1#    Hip Abduction  Stengthening;Left;2  sets;5 reps    Abduction Limitations  1#    Hip Extension  Stengthening;Both;2 sets;5 sets;Knee straight;Left    Extension Limitations  1#      Knee/Hip Exercises: Supine   Bridges  Strengthening;2 sets;10 reps    Straight Leg Raises  Strengthening;Left;2 sets;10 reps      Manual Therapy   Soft tissue mobilization  Lt knee anterior, joint lines M/L and posterior knee          Balance Exercises - 05/20/19 1505      Balance Exercises: Standing   SLS  Eyes open;Upper extremity support 2;Foam/compliant surface;2 reps;10 secs    Other Standing Exercises  pool noodle step overs 3x5 laps out and back, 5 cone weaving 5 laps down and back          PT Short Term Goals - 05/04/19 1532      PT SHORT TERM GOAL #1   Title  be independent in initial HEP    Status  Achieved      PT SHORT TERM GOAL #2   Title  improve LE strength to ascend steps with step-over-step and use of 1 rail    Status  Achieved      PT SHORT TERM GOAL #3   Title  perform 5x sit to stand in < or = to 30 seconds without UE support to reduce falls risk    Status  Achieved      PT SHORT TERM GOAL #4   Title  report < or = to 5/10 Lt knee pain with standing and walking    Status  Achieved        PT Long Term Goals - 05/13/19 1529      PT LONG TERM GOAL #2   Title  improve LE strength to ascend and descend steps with step-over-step gait    Status  Achieved            Plan - 05/20/19 1527    Clinical Impression Statement  Pt with slight increase in pain after keeping her granddaughter.  Pain improved within session today.  Pt on track to meet goals and be d/c'd next week.  Improving dynamic gait and balance and Lt LE strength.  Good patellar mobs all planes.  Finalize HEP next visit.    Comorbidities  Lt knee scope, CHF/MI, RA    PT Frequency  2x / week    PT Duration  6 weeks  PT Treatment/Interventions  Cryotherapy;ADLs/Self Care Home Management;Electrical Stimulation;Moist Heat;Gait  training;Stair training;Functional mobility training;Therapeutic activities;Therapeutic exercise;Balance training;Neuromuscular re-education;Manual techniques;Taping;Passive range of motion;Aquatic Therapy    PT Next Visit Plan  finalize HEP and d/c next visit    PT Home Exercise Plan  Access Code: [email or web address]    Consulted and Agree with Plan of Care  Patient       Patient will benefit from skilled therapeutic intervention in order to improve the following deficits and impairments:     Visit Diagnosis: Muscle weakness (generalized)  Chronic pain of left knee  Other abnormalities of gait and mobility     Problem List Patient Active Problem List   Diagnosis Date Noted  . Hypoxia 03/05/2019  . Derangement of posterior horn of medial meniscus 02/02/2019  . Derangement of posterior horn of lateral meniscus 02/02/2019  . Meniscal cyst, unspecified laterality 12/01/2018  . Torn medial meniscus 12/01/2018  . Primary osteoarthritis of left knee 12/01/2018  . Coronary artery disease involving native coronary artery of native heart without angina pectoris 09/14/2018  . Chronic diastolic (congestive) heart failure ([place])   . Chronic pain of left knee 05/15/2018  . Mass of left knee 05/15/2018  . Non-ST elevation (NSTEMI) myocardial infarction ([place])   . Chest pain 01/25/2018  . RA (rheumatoid arthritis) ([place]) 01/25/2018  . Hyperlipidemia 01/25/2018  . Hypokalemia 01/25/2018  . Depression with anxiety 01/25/2018  . Tobacco abuse 01/25/2018  . Lower extremity cellulitis 01/25/2018  . Benzodiazepine dependence ([place])   . MDD (major depressive disorder), recurrent severe, without psychosis ([place]) 10/02/2017  . CTS (carpal tunnel syndrome) 06/08/2014    Baruch Merl, PT 05/20/19 3:29 PM   [facility] [address], [place], [place], [address] Phone: [phone]   Fax:  [phone]  Name: Meghan Schwartz MRN: [id number] Date  of Birth: February 06, 1951

## 2019-05-21 LAB — BASIC METABOLIC PANEL
BUN/Creatinine Ratio: 27 (ref 12–28)
BUN: 21 mg/dL (ref 8–27)
CO2: 29 mmol/L (ref 20–29)
Calcium: 9 mg/dL (ref 8.7–10.3)
Chloride: 100 mmol/L (ref 96–106)
Creatinine, Ser: 0.78 mg/dL (ref 0.57–1.00)
GFR calc Af Amer: 90 mL/min/{1.73_m2} (ref >59)
GFR calc non Af Amer: 78 mL/min/{1.73_m2} (ref >59)
Glucose: 93 mg/dL (ref 65–99)
Potassium: 4.1 mmol/L (ref 3.5–5.2)
Sodium: 141 mmol/L (ref 134–144)

## 2019-05-25 ENCOUNTER — Other Ambulatory Visit: Payer: Self-pay

## 2019-05-25 ENCOUNTER — Ambulatory Visit: Payer: Medicare PPO | Admitting: Physical Therapy

## 2019-05-25 ENCOUNTER — Encounter: Payer: Self-pay | Admitting: Physical Therapy

## 2019-05-25 DIAGNOSIS — M6281 Muscle weakness (generalized): Secondary | ICD-10-CM | POA: Diagnosis not present

## 2019-05-25 DIAGNOSIS — M25562 Pain in left knee: Secondary | ICD-10-CM

## 2019-05-25 DIAGNOSIS — G8929 Other chronic pain: Secondary | ICD-10-CM

## 2019-05-25 DIAGNOSIS — R2689 Other abnormalities of gait and mobility: Secondary | ICD-10-CM

## 2019-05-25 NOTE — Therapy (Signed)
[facility] [address] [place], [place], [address] Phone: [phone]   Fax:  [phone]  Physical Therapy Treatment  Patient Details  Name: Meghan Schwartz MRN: [id number] Date of Birth: 07/29/1950 Referring Provider (PT): Joni Fears, MD   Encounter Date: 05/25/2019  PT End of Session - 05/25/19 1456    Visit Number  [place] - Visit Number  3    Authorization - Number of Visits  4    PT Start Time  1450    PT Stop Time  7062    PT Time Calculation (min)  40 min    Activity Tolerance  Patient tolerated treatment well    Behavior During Therapy  [facility] for tasks assessed/performed       Past Medical History:  Diagnosis Date  . Allergic rhinitis   . Anxiety   . Benzodiazepine dependence ([place])   . Bruxism   . CAD (coronary artery disease)    S/p NSTEMI 01/2018 >> LHC: oLAD 20, pLAD 20; oD1 20, oLCx 40, pRCA 99 >> PCI: DES to prox RCA // Echo 01/2018: EF 50-55; prob inf-lat and inf HK  . Chronic diastolic heart failure    Echocardiogram 07/2018: EF 37-62, grade 1 diastolic dysfunction, normal wall motion, normal GLS (-22.3), PASP 28  . Chronic foot pain    bunions, torn ligaments-on chronic pain medication  . CTS (carpal tunnel syndrome) 06/08/2014  . Depression   . High cholesterol   . Hypertension   . MDD (major depressive disorder) 10/02/2017  . Migraine   . Myocardial infarction ([place]) 02/01/2018  . Pneumonia 1986   left lung  . RA (rheumatoid arthritis) ([place])     Past Surgical History:  Procedure Laterality Date  . BTL  2000  . CARPAL TUNNEL RELEASE Left   . [place]  . CORONARY STENT INTERVENTION N/A 01/27/2018   Procedure: CORONARY STENT INTERVENTION;  Surgeon: Burnell Blanks, MD;  Location: [place] [facility];  Service: Cardiovascular;  Laterality: N/A;  . eye implants    . FOOT SURGERY Left 2008  . front teeth removed  after injury    . KNEE ARTHROSCOPY WITH LATERAL MENISECTOMY Left 02/02/2019   Procedure: LEFT KNEE ARTHROSCOPY, MEDIAL AND LATERAL MENISECTOMY, EXCISION OF LEFT LATERAL MENISCAL CYST;  Surgeon: Garald Balding, MD;  Location: [facility];  Service: Orthopedics;  Laterality: Left;  . LEFT HEART CATH AND CORONARY ANGIOGRAPHY N/A 01/27/2018   Procedure: LEFT HEART CATH AND CORONARY ANGIOGRAPHY;  Surgeon: Burnell Blanks, MD;  Location: [place] [facility];  Service: Cardiovascular;  Laterality: N/A;  . pre cancerous lesion removed from forehead    . TOTAL ABDOMINAL HYSTERECTOMY W/ BILATERAL SALPINGOOPHORECTOMY  2002   Dr. Willis [place]  . VAGINAL HYSTERECTOMY  2001    There were no vitals filed for this visit.  Subjective Assessment - 05/25/19 1453    Subjective  I had my granddaughter with me again for a few days so the knee hurts when I have to go up/down the stairs all the time.  Today the knee is better than the last 2 days were.    Pertinent History  RA, foot deformity with rheumatoid drift, LE edema    Limitations  Walking    How long can you stand comfortably?  20 minutes- limited by RA in feet.    How long can you walk comfortably?  limited by  knee and feet: 20-30 minutes    Currently in Pain?  Yes    Pain Score  2     Pain Location  Knee    Pain Orientation  Left;Anterior;Posterior    Pain Descriptors / Indicators  Aching    Pain Type  Surgical pain    Pain Onset  More than a month ago    Pain Frequency  Intermittent    Aggravating Factors   days where I have to use the stairs constantly, like when I have my granddaughter    Pain Relieving Factors  rest    Effect of Pain on Daily Activities  walk > 15'                       [facility] Adult PT Treatment/Exercise - 05/25/19 0001      Exercises   Exercises  Knee/Hip      Knee/Hip Exercises: Aerobic   Nustep  L3 x 7', PT present to review goals      Manual Therapy   Soft tissue mobilization  Lt knee anterior,  joint lines M/L and posterior knee, medial hamstring, ITB          Balance Exercises - 05/25/19 1505      Balance Exercises: Standing   SLS  Eyes open;Upper extremity support 2;Foam/compliant surface;2 reps;10 secs    Tandem Gait  Forward;Retro;Upper extremity support;4 reps    Marching  Foam/compliant surface;20 reps;Upper extremity assist 2    Other Standing Exercises  pool noodle step overs 3x5 laps out and back, 5 cone weaving 5 laps down and back          PT Short Term Goals - 05/04/19 1532      PT SHORT TERM GOAL #1   Title  be independent in initial HEP    Status  Achieved      PT SHORT TERM GOAL #2   Title  improve LE strength to ascend steps with step-over-step and use of 1 rail    Status  Achieved      PT SHORT TERM GOAL #3   Title  perform 5x sit to stand in < or = to 30 seconds without UE support to reduce falls risk    Status  Achieved      PT SHORT TERM GOAL #4   Title  report < or = to 5/10 Lt knee pain with standing and walking    Status  Achieved        PT Long Term Goals - 05/25/19 1458      PT LONG TERM GOAL #1   Title  be independent in advanced HEP    Status  Achieved      PT LONG TERM GOAL #2   Title  improve LE strength to ascend and descend steps with step-over-step gait    Baseline  alternating pattern going up most of the time, step to coming down    Status  Achieved      PT LONG TERM GOAL #3   Title  perform 5x sit to stand in < or = to 20 seconds without UE support to reduce falls risk    Baseline  13 sec    Status  Achieved      PT LONG TERM GOAL #4   Title  reduce FOTO to < or = to 39% limitation    Status  Achieved            Plan - 05/25/19  60    Clinical Impression Statement  Pt is pleased with her progress. She has intermittent knee pain which is usually exacerbated by having to do repetitive stairs at home when caring for her grandddaughter.  She displays much imrpoved strength surrounding surgical knee and bil  LEs throughout.  PT also focused on dynamic gait and balance given fall risk history and Pt has not fallen since starting PT.  She displayed ability to do tight cone weaving and step overs of objects without need for close supervision today.  She is comfortable performing standing LE strength on compliant surface (foam pad).  She is ready for d/c to HEP and understands she may return to clinic with a new order should she have any future set backs.    Comorbidities  Lt knee scope, CHF/MI, RA    Stability/Clinical Decision Making  Evolving/Moderate complexity    Rehab Potential  Good    PT Frequency  2x / week    PT Duration  6 weeks    PT Treatment/Interventions  Cryotherapy;ADLs/Self Care Home Management;Electrical Stimulation;Moist Heat;Gait training;Stair training;Functional mobility training;Therapeutic activities;Therapeutic exercise;Balance training;Neuromuscular re-education;Manual techniques;Taping;Passive range of motion;Aquatic Therapy    PT Next Visit Plan  d/c to HEP    PT Home Exercise Plan  Access Code: [email or web address]    Consulted and Agree with Plan of Care  Patient       Patient will benefit from skilled therapeutic intervention in order to improve the following deficits and impairments:     Visit Diagnosis: Muscle weakness (generalized)  Chronic pain of left knee  Other abnormalities of gait and mobility     Problem List Patient Active Problem List   Diagnosis Date Noted  . Hypoxia 03/05/2019  . Derangement of posterior horn of medial meniscus 02/02/2019  . Derangement of posterior horn of lateral meniscus 02/02/2019  . Meniscal cyst, unspecified laterality 12/01/2018  . Torn medial meniscus 12/01/2018  . Primary osteoarthritis of left knee 12/01/2018  . Coronary artery disease involving native coronary artery of native heart without angina pectoris 09/14/2018  . Chronic diastolic (congestive) heart failure ([place])   . Chronic pain of left knee 05/15/2018  . Mass of left  knee 05/15/2018  . Non-ST elevation (NSTEMI) myocardial infarction ([place])   . Chest pain 01/25/2018  . RA (rheumatoid arthritis) ([place]) 01/25/2018  . Hyperlipidemia 01/25/2018  . Hypokalemia 01/25/2018  . Depression with anxiety 01/25/2018  . Tobacco abuse 01/25/2018  . Lower extremity cellulitis 01/25/2018  . Benzodiazepine dependence ([place])   . MDD (major depressive disorder), recurrent severe, without psychosis ([place]) 10/02/2017  . CTS (carpal tunnel syndrome) 06/08/2014    PHYSICAL THERAPY DISCHARGE SUMMARY  Visits from Start of Care: 14  Current functional level related to goals / functional outcomes: See above    Remaining deficits: See above    Education / Equipment: HEP Plan: Patient agrees to discharge.  Patient goals were met. Patient is being discharged due to meeting the stated rehab goals.  ?????        Baruch Merl, PT 05/25/19 4:44 PM   [facility] [address] [place], [place], [address] Phone: [phone]   Fax:  [phone]  Name: Meghan Schwartz MRN: [id number] Date of Birth: 09-20-1950

## 2019-05-27 ENCOUNTER — Encounter: Payer: Medicare PPO | Admitting: Physical Therapy

## 2019-07-07 ENCOUNTER — Other Ambulatory Visit: Payer: Self-pay | Admitting: Family Medicine

## 2019-07-07 DIAGNOSIS — Z1231 Encounter for screening mammogram for malignant neoplasm of breast: Secondary | ICD-10-CM

## 2019-07-13 ENCOUNTER — Ambulatory Visit: Payer: Medicare PPO | Attending: Family Medicine | Admitting: Physical Therapy

## 2019-07-13 ENCOUNTER — Encounter: Payer: Self-pay | Admitting: Physical Therapy

## 2019-07-13 ENCOUNTER — Other Ambulatory Visit: Payer: Self-pay

## 2019-07-13 DIAGNOSIS — M6281 Muscle weakness (generalized): Secondary | ICD-10-CM

## 2019-07-13 DIAGNOSIS — M542 Cervicalgia: Secondary | ICD-10-CM | POA: Diagnosis present

## 2019-07-13 DIAGNOSIS — R293 Abnormal posture: Secondary | ICD-10-CM

## 2019-07-13 NOTE — Therapy (Signed)
[facility] [address] [place], [place], [address] Phone: [phone]   Fax:  [phone]  Physical Therapy Evaluation  Patient Details  Name: Meghan Schwartz MRN: [id number] Date of Birth: 1950-12-06 Referring Provider (PT): Shirlean Mylar, MD   Encounter Date: 07/13/2019  PT End of Session - 07/13/19 1534    Visit Number  1    Date for PT Re-Evaluation  10/05/19    Authorization Type  [employer]    Authorization Time Period  requesting 12 visits    Progress Note Due on Visit  10    PT Start Time  1405    PT Stop Time  1445    PT Time Calculation (min)  40 min    Activity Tolerance  Patient tolerated treatment well    Behavior During Therapy  [facility] for tasks assessed/performed       Past Medical History:  Diagnosis Date  . Allergic rhinitis   . Anxiety   . Benzodiazepine dependence ([facility])   . Bruxism   . CAD (coronary artery disease)    S/p NSTEMI 01/2018 >> LHC: oLAD 20, pLAD 20; oD1 20, oLCx 40, pRCA 99 >> PCI: DES to prox RCA // Echo 01/2018: EF 50-55; prob inf-lat and inf HK  . Chronic diastolic heart failure    Echocardiogram 07/2018: EF 55-60, grade 1 diastolic dysfunction, normal wall motion, normal GLS (-22.3), PASP 28  . Chronic foot pain    bunions, torn ligaments-on chronic pain medication  . CTS (carpal tunnel syndrome) 06/08/2014  . Depression   . High cholesterol   . Hypertension   . MDD (major depressive disorder) 10/02/2017  . Migraine   . Myocardial infarction ([facility]) 02/01/2018  . Pneumonia 1986   left lung  . RA (rheumatoid arthritis) ([facility])     Past Surgical History:  Procedure Laterality Date  . BTL  2000  . CARPAL TUNNEL RELEASE Left   . CESAREAN SECTION  1978. 1983  . CORONARY STENT INTERVENTION N/A 01/27/2018   Procedure: CORONARY STENT INTERVENTION;  Surgeon: Kathleene Hazel, MD;  [facility]: [facility];  Service: Cardiovascular;  Laterality: N/A;  . eye implants    . FOOT  SURGERY Left 2008  . front teeth removed after injury    . KNEE ARTHROSCOPY WITH LATERAL MENISECTOMY Left 02/02/2019   Procedure: LEFT KNEE ARTHROSCOPY, MEDIAL AND LATERAL MENISECTOMY, EXCISION OF LEFT LATERAL MENISCAL CYST;  Surgeon: Valeria Batman, MD;  Location: [facility];  Service: Orthopedics;  Laterality: Left;  . LEFT HEART CATH AND CORONARY ANGIOGRAPHY N/A 01/27/2018   Procedure: LEFT HEART CATH AND CORONARY ANGIOGRAPHY;  Surgeon: Kathleene Hazel, MD;  Location: [facility];  Service: Cardiovascular;  Laterality: N/A;  . pre cancerous lesion removed from forehead    . TOTAL ABDOMINAL HYSTERECTOMY W/ BILATERAL SALPINGOOPHORECTOMY  2002   Dr. Jackelyn Knife  . VAGINAL HYSTERECTOMY  2001    There were no vitals filed for this visit.   Subjective Assessment - 07/13/19 1407    Subjective  Pt referred to PT for chronic neck pain.  Prednisone pack helped resolve pain while on the pack but it returned 2 days later.  Pt did have a fall approx 1 year ago with a spike of pain.  Pain has been present in neck for approx 5 years.  Sometimes temporal headaches accompany the neck pain.  Pain prevents Pt from sleeping well.    Pertinent History  RA, foot deformity with rheumatoid  drift, LE edema    Limitations  House hold activities;Lifting;Sitting    How long can you sit comfortably?  10 min    How long can you stand comfortably?  -    How long can you walk comfortably?  -    Diagnostic tests  MRI 2020: Mod multilevel degenerative changes throughoutc-spine, most prominent at C6-7. Mod to severe bilfacet arthropathy. Mild degenerative foraminal stenosis on the right at C3-4 and C4-5.    Patient Stated Goals  better neck mobility, less pain with daily tasks, improved sleep    Currently in Pain?  Yes    Pain Score  4    ranges from 2-8   Pain Location  Neck    Pain Orientation  Right;Left;Posterior;Lateral    Pain Descriptors / Indicators  Tightness;Sharp;Aching    Pain Type  Chronic pain     Pain Radiating Towards  temporal headache sometimes    Pain Onset  More than a month ago    Pain Frequency  Constant    Aggravating Factors   turning head, sitting > 10', driving, lifting, household tasks    Pain Relieving Factors  prednisone (short lived), laying down, heat, Tylenol    Effect of Pain on Daily Activities  driving, sleep, household tasks         [facility] PT Assessment - 07/13/19 0001      Assessment   Medical Diagnosis  M47.812 (ICD-10-CM) - Spondylosis without myelopathy or radiculopathy, cervical region    Referring Provider (PT)  Shirlean Mylar, MD    Onset Date/Surgical Date  --   approx 5 years ago   Hand Dominance  Right    Next MD Visit  6 mos    Prior Therapy  yes for knee here at this facility      Precautions   Precautions  None    Precaution Comments  Pt has RA      Restrictions   Weight Bearing Restrictions  No      Balance Screen   Has the patient fallen in the past 6 months  Yes    How many times?  1    Has the patient had a decrease in activity level because of a fear of falling?   Yes    Is the patient reluctant to leave their home because of a fear of falling?   No      Home Environment   Living Environment  Private residence    Living Arrangements  Spouse/significant other    Type of Home  House    Home Access  Stairs to enter    Home Layout  Two level      Prior Function   Level of Independence  Independent    Vocation  Retired    Leisure  shopping, play with grandkids, read      Cognition   Overall Cognitive Status  Within Functional Limits for tasks assessed      Observation/Other Assessments   Focus on Therapeutic Outcomes (FOTO)   68% goal 53%      Posture/Postural Control   Posture/Postural Control  Postural limitations    Postural Limitations  Decreased thoracic kyphosis    Posture Comments  reduced cervical lordosis, mild Dowager's hump preesnt      ROM / Strength   AROM / PROM / Strength  AROM;Strength      AROM    Overall AROM Comments  bil shoulder end range flexion and abd restricted, no pain  AROM Assessment Site  Cervical    Cervical Flexion  40   pain   Cervical Extension  45    Cervical - Right Side Bend  25    Cervical - Left Side Bend  25    Cervical - Right Rotation  40   pain   Cervical - Left Rotation  30   pain     Strength   Overall Strength Comments  bil UE strength WFL 4+-5/5 bil throughout, neck 5/5 with exception of 4/5 bil rot      Palpation   Spinal mobility  poor U-joint sideglides on Lt, thoracic downglides and PAs 1/5 T1-T3, mid-lower cervical upglides 1/5 bil    Palpation comment  tender bil UTs, facets C2-T3 bil, cervical paraspinals bil, cervical multifidi bil, SO bil      Special Tests    Special Tests  Cervical    Cervical Tests  Dictraction;Spurling's      Spurling's   Findings  Positive    Side  Right   and Lt     Distraction Test   Findngs  Positive    Comment  for relief                  Objective measurements completed on examination: See above findings.              PT Education - 07/13/19 1443    Education Details  spinal decompression, DN info    Person(s) Educated  Patient    Methods  Explanation;Handout;Demonstration    Comprehension  Verbalized understanding;Returned demonstration       PT Short Term Goals - 07/13/19 1539      PT SHORT TERM GOAL #1   Title  Pt will report improved neck pain with daily tasks and sleep by at least 20%    Time  4    Period  Weeks    Status  New    Target Date  08/10/19      PT SHORT TERM GOAL #2   Title  Pt will achieve painfree bil neck rotation of at least 45 deg to improve driving visibility.    Baseline  -    Time  6    Period  Weeks    Status  New    Target Date  08/24/19      PT SHORT TERM GOAL #3   Title  Pt will be ind with initial HEP and mindful of daily posture.    Baseline  -    Time  6    Period  Weeks    Status  New    Target Date  08/24/19      PT  SHORT TERM GOAL #4   Title  -    Baseline  -        PT Long Term Goals - 07/13/19 1540      PT LONG TERM GOAL #1   Title  Pt will understand how to use tools of HEP for chronic neck pain management    Baseline  -    Time  12    Period  Weeks    Status  New    Target Date  10/05/19      PT LONG TERM GOAL #2   Title  Pt will demo improved neck flexion to 50 deg, ext 50 deg, bil rotation >/= 55 deg for improved functional range for driving and household tasks.    Baseline  -  Time  12    Period  Weeks    Status  New    Target Date  10/05/19      PT LONG TERM GOAL #3   Title  Pt will report at least 50% improvement in neck pain with daily tasks, sitting activites up to 20+ min, and sleep.    Baseline  1    Time  12    Period  Weeks    Status  New    Target Date  10/05/19      PT LONG TERM GOAL #4   Title  Reduced FOTO to </= 53% limitation    Baseline  68% limited    Time  12    Period  Weeks    Status  New    Target Date  10/05/19      PT LONG TERM GOAL #5   Title  Pt will achieve at least 4+/5 cervical and UE strength for improved postural support and bone density management.    Time  12    Period  Weeks    Status  New    Target Date  10/05/19             Plan - 07/13/19 1544    Clinical Impression Statement  Pt with chronic history of neck pain which limits household tasks, sitting tolerance, driving comfort and sleep.  Temporal headache accompanies neck pain intermittently.  Neck pain is constant but improves with heat, Tylenol.  Prednisone pack gave most relief but was short lived relief.  Pain ranges throughout day from 2/10 to 8/10 depending on activity/use of neck.  FOTO score is 68% limitation.  MRI in 2020 revealed mod-severe multi-level degenerative changes.  PMH includes RA and osteoporosis.  Pt presents with postural abnormality, painful and limited cervical A/ROM, end range shoulder A/ROM flexion and abd limitations, weakness in cervical rotation  bil and shoulders ranging 4-/5 to 4/5, signif joint restrictions in U-joints Lt>Rt and facets mid-lower c-spine > upper cervical spine, and tender trigger points in bil upper traps, cervical multifidi, SOs and upper thoracic SPs and facets.  PT initiated spinal decompression series today and gave DN info to consider as part of treatment plan.  Pt will benefit from skilled PT to address pain and deficits and build tools for improved management of chronic pain.    Personal Factors and Comorbidities  Comorbidity 1;Comorbidity 2;Comorbidity 3+;Fitness;Age;Time since onset of injury/illness/exacerbation    Comorbidities  RA, osteoporosis, CHF    Examination-Activity Limitations  Bathing;Reach Overhead;Sit;Lift;Dressing;Hygiene/Grooming;Carry    Examination-Participation Restrictions  Meal Prep;Cleaning;Community Activity;Driving;Laundry    Stability/Clinical Decision Making  Stable/Uncomplicated    Clinical Decision Making  Low    Rehab Potential  Good    PT Frequency  2x / week    PT Duration  12 weeks    PT Treatment/Interventions  ADLs/Self Care Home Management;Cryotherapy;Electrical Stimulation;Moist Heat;Traction;Neuromuscular re-education;Therapeutic exercise;Therapeutic activities;Patient/family education;Joint Manipulations;Spinal Manipulations;Passive range of motion;Dry needling;Manual techniques;Taping    PT Next Visit Plan  review spinal decompression, DN upper traps and cervical MF, gentle manual techniques, A/ROM, mobility, scap stab    PT Home Exercise Plan  spinal decompression series, DN info    Consulted and Agree with Plan of Care  Patient       Patient will benefit from skilled therapeutic intervention in order to improve the following deficits and impairments:  Decreased range of motion, Increased muscle spasms, Impaired UE functional use, Decreased activity tolerance, Pain, Hypomobility, Impaired flexibility, Postural dysfunction, Decreased strength, Decreased  mobility  Visit  Diagnosis: Cervicalgia - Plan: PT plan of care cert/re-cert  Abnormal posture - Plan: PT plan of care cert/re-cert  Muscle weakness (generalized) - Plan: PT plan of care cert/re-cert     Problem List Patient Active Problem List   Diagnosis Date Noted  . Hypoxia 03/05/2019  . Derangement of posterior horn of medial meniscus 02/02/2019  . Derangement of posterior horn of lateral meniscus 02/02/2019  . Meniscal cyst, unspecified laterality 12/01/2018  . Torn medial meniscus 12/01/2018  . Primary osteoarthritis of left knee 12/01/2018  . Coronary artery disease involving native coronary artery of native heart without angina pectoris 09/14/2018  . Chronic diastolic (congestive) heart failure ([place])   . Chronic pain of left knee 05/15/2018  . Mass of left knee 05/15/2018  . Non-ST elevation (NSTEMI) myocardial infarction ([place])   . Chest pain 01/25/2018  . RA (rheumatoid arthritis) ([place]) 01/25/2018  . Hyperlipidemia 01/25/2018  . Hypokalemia 01/25/2018  . Depression with anxiety 01/25/2018  . Tobacco abuse 01/25/2018  . Lower extremity cellulitis 01/25/2018  . Benzodiazepine dependence ([place])   . MDD (major depressive disorder), recurrent severe, without psychosis ([place]) 10/02/2017  . CTS (carpal tunnel syndrome) 06/08/2014    Baruch Merl, PT 07/13/19 3:59 PM   [facility] [address], [place], [place], [address] Phone: [phone]   Fax:  [phone]  Name: Meghan Schwartz MRN: [id number] Date of Birth: 04-07-1950

## 2019-07-13 NOTE — Patient Instructions (Signed)
RE-ALIGNMENT ROUTINE EXERCISES-OSTEOPROROSIS BASIC FOR POSTURAL CORRECTION   RE-ALIGNMENT Tips BENEFITS: 1.It helps to re-align the curves of the back and improve standing posture. 2.It allows the back muscles to rest and strengthen in preparation for more activity. FREQUENCY: Daily, even after weeks, months and years of more advanced exercises. START: 1.All exercises start in the same position: lying on the back, arms resting on the supporting surface, palms up and slightly away from the body, backs of hands down, knees bent, feet flat. 2.The head, neck, arms, and legs are supported according to specific instructions of your therapist. Copyright  [employer]. All rights reserved.    1. Decompression Exercise: Basic.   Takes compression off the vertebral bodies; increases tolerance for lying on the back; helps relieve back pain   Lie on back on firm surface, knees bent, feet flat, arms turned up, out to sides (~35 degrees). Head neck and arms supported as necessary. Time _5-15__ minutes. Surface: floor     2. Shoulder Press  Strengthens upper back extensors and scapular retractors.   Press both shoulders down. Hold _2-3__ seconds. Repeat _3-5__ times. Surface: floor        3. Head Press With Hemlock  Strengthens neck extensors   Tuck chin SLIGHTLY toward chest, keep mouth closed. Feel weight on back of head. Increase weight by pressing head down. Hold _2-3__ seconds. Relax. Repeat 3-5___ times. Surface: floor     4. Leg Lengthener: stretches quadratus lumborum and hip flexors.  Strengthens quads and ankle dorsiflexors.  Leg Lengthener: Full    Straighten one leg. Pull toes AND forefoot toward knee, extend heel. Lengthen leg by pulling pelvis away from ribs. Hold __5_ seconds. Relax. Repeat 1 time. Re-bend knee. Do other leg. Each leg __5_ times. Surface: floor    Leg Lengthener / Leg Press Combo: Single Leg    Straighten one leg down to floor. Pull toes AND forefoot  toward knee; extend heel. Lengthen leg by pulling pelvis away from ribs. Press leg down. DO NOT BEND KNEE. Hold _5__ seconds. Relax leg. Repeat exercise 1 time. Relax leg. Re-bend knee. Repeat with other leg. Do 5 times   Trigger Point Dry Needling  . What is Trigger Point Dry Needling (DN)? o DN is a physical therapy technique used to treat muscle pain and dysfunction. Specifically, DN helps deactivate muscle trigger points (muscle knots).  o A thin filiform needle is used to penetrate the skin and stimulate the underlying trigger point. The goal is for a local twitch response (LTR) to occur and for the trigger point to relax. No medication of any kind is injected during the procedure.   . What Does Trigger Point Dry Needling Feel Like?  o The procedure feels different for each individual patient. Some patients report that they do not actually feel the needle enter the skin and overall the process is not painful. Very mild bleeding may occur. However, many patients feel a deep cramping in the muscle in which the needle was inserted. This is the local twitch response.   Marland Kitchen How Will I feel after the treatment? o Soreness is normal, and the onset of soreness may not occur for a few hours. Typically this soreness does not last longer than two days.  o Bruising is uncommon, however; ice can be used to decrease any possible bruising.  o In rare cases feeling tired or nauseous after the treatment is normal. In addition, your symptoms may get worse before they get better, this period will  typically not last longer than 24 hours.   . What Can I do After My Treatment? o Increase your hydration by drinking more water for the next 24 hours. o You may place ice or heat on the areas treated that have become sore, however, do not use heat on inflamed or bruised areas. Heat often brings more relief post needling. o You can continue your regular activities, but vigorous activity is not recommended initially  after the treatment for 24 hours. o DN is best combined with other physical therapy such as strengthening, stretching, and other therapies.    [facility] [address], [address] [place], [place] [address] Phone # [phone] Fax [phone]

## 2019-07-14 ENCOUNTER — Ambulatory Visit
Admission: RE | Admit: 2019-07-14 | Discharge: 2019-07-14 | Disposition: A | Discharge status: Home / Self Care | Payer: Medicare PPO | Source: Ambulatory Visit | Attending: Family Medicine | Admitting: Family Medicine

## 2019-07-14 ENCOUNTER — Ambulatory Visit: Payer: Medicare PPO

## 2019-07-14 ENCOUNTER — Other Ambulatory Visit: Payer: Self-pay

## 2019-07-14 DIAGNOSIS — Z1231 Encounter for screening mammogram for malignant neoplasm of breast: Secondary | ICD-10-CM

## 2019-08-17 ENCOUNTER — Ambulatory Visit: Payer: Medicare PPO | Attending: Family Medicine | Admitting: Physical Therapy

## 2019-08-17 ENCOUNTER — Encounter: Payer: Self-pay | Admitting: Physical Therapy

## 2019-08-17 ENCOUNTER — Other Ambulatory Visit: Payer: Self-pay

## 2019-08-17 DIAGNOSIS — M6281 Muscle weakness (generalized): Secondary | ICD-10-CM | POA: Diagnosis present

## 2019-08-17 DIAGNOSIS — R293 Abnormal posture: Secondary | ICD-10-CM | POA: Diagnosis present

## 2019-08-17 DIAGNOSIS — M542 Cervicalgia: Secondary | ICD-10-CM | POA: Diagnosis present

## 2019-08-17 NOTE — Therapy (Signed)
[facility] [address], [address] [place], [place], [address] Phone: [phone]   Fax:  [phone]  Physical Therapy Treatment  Patient Details  Name: Meghan Schwartz MRN: [id number] Date of Birth: 07/11/1950 Referring Provider (PT): Shirlean Mylar, MD   Encounter Date: 08/17/2019   PT End of Session - 08/17/19 1331    Visit Number 2    Date for PT Re-Evaluation 10/05/19    Authorization Type [employer]    Authorization Time Period Cohere approval 12 visits 5/18-8/10    Authorization - Visit Number 2    Authorization - Number of Visits 12    Progress Note Due on Visit 10    PT Start Time 1150    PT Stop Time 1230    PT Time Calculation (min) 40 min    Activity Tolerance Patient tolerated treatment well    Behavior During Therapy [facility] for tasks assessed/performed           Past Medical History:  Diagnosis Date  . Allergic rhinitis   . Anxiety   . Benzodiazepine dependence (HCC)   . Bruxism   . CAD (coronary artery disease)    S/p NSTEMI 01/2018 >> LHC: oLAD 20, pLAD 20; oD1 20, oLCx 40, pRCA 99 >> PCI: DES to prox RCA // Echo 01/2018: EF 50-55; prob inf-lat and inf HK  . Chronic diastolic heart failure    Echocardiogram 07/2018: EF 55-60, grade 1 diastolic dysfunction, normal wall motion, normal GLS (-22.3), PASP 28  . Chronic foot pain    bunions, torn ligaments-on chronic pain medication  . CTS (carpal tunnel syndrome) 06/08/2014  . Depression   . High cholesterol   . Hypertension   . MDD (major depressive disorder) 10/02/2017  . Migraine   . Myocardial infarction (HCC) 02/01/2018  . Pneumonia 1986   left lung  . RA (rheumatoid arthritis) (HCC)     Past Surgical History:  Procedure Laterality Date  . BTL  2000  . CARPAL TUNNEL RELEASE Left   . CESAREAN SECTION  1978. 1983  . CORONARY STENT INTERVENTION N/A 01/27/2018   Procedure: CORONARY STENT INTERVENTION;  Surgeon: Kathleene Hazel, MD;  Location: [facility]  INVASIVE CV LAB;  Service: Cardiovascular;  Laterality: N/A;  . eye implants    . FOOT SURGERY Left 2008  . front teeth removed after injury    . KNEE ARTHROSCOPY WITH LATERAL MENISECTOMY Left 02/02/2019   Procedure: LEFT KNEE ARTHROSCOPY, MEDIAL AND LATERAL MENISECTOMY, EXCISION OF LEFT LATERAL MENISCAL CYST;  Surgeon: Valeria Batman, MD;  Location: [facility] ORS;  Service: Orthopedics;  Laterality: Left;  . LEFT HEART CATH AND CORONARY ANGIOGRAPHY N/A 01/27/2018   Procedure: LEFT HEART CATH AND CORONARY ANGIOGRAPHY;  Surgeon: Kathleene Hazel, MD;  Location: [facility] INVASIVE CV LAB;  Service: Cardiovascular;  Laterality: N/A;  . pre cancerous lesion removed from forehead    . TOTAL ABDOMINAL HYSTERECTOMY W/ BILATERAL SALPINGOOPHORECTOMY  2002   Dr. Jackelyn Knife  . VAGINAL HYSTERECTOMY  2001    There were no vitals filed for this visit.   Subjective Assessment - 08/17/19 1149    Subjective I'm doing spinal decompression.  No miraculous fix on the neck.    Pertinent History RA, foot deformity with rheumatoid drift, LE edema    Limitations House hold activities;Lifting;Sitting    How long can you sit comfortably? 10 min    Diagnostic tests MRI 2020: Mod multilevel degenerative changes throughoutc-spine, most prominent at C6-7. Mod to severe bilfacet arthropathy.  Mild degenerative foraminal stenosis on the right at C3-4 and C4-5.    Patient Stated Goals better neck mobility, less pain with daily tasks, improved sleep    Currently in Pain? Yes    Pain Score 4     Pain Location Neck    Pain Orientation Right;Left;Posterior;Lateral    Pain Descriptors / Indicators Tightness;Sharp;Aching    Pain Type Chronic pain    Pain Onset More than a month ago    Pain Frequency Constant    Aggravating Factors  movement of head, driving    Pain Relieving Factors heat, laying down, meds    Effect of Pain on Daily Activities driving                             [facility] Adult PT  Treatment/Exercise - 08/17/19 0001      Exercises   Exercises Neck      Neck Exercises: Seated   Cervical Rotation Both;5 reps    Cervical Rotation Limitations in retraction    Other Seated Exercise upper cervical nodding stretch 2x10 sec, PT gave demo and VCs      Neck Exercises: Supine   Neck Retraction 5 reps    Cervical Rotation Both;10 reps    Cervical Rotation Limitations in retraction      Manual Therapy   Manual Therapy Joint mobilization;Soft tissue mobilization;Manual Traction;Other (comment)    Joint Mobilization U joint side glides Lt, upglides midcervical, downglides upper thoracic, bil, Gr II/III    Soft tissue mobilization bil scalenes, SCM, cervical paraspinals and MF bil    Manual Traction Gr II/III upper, mid, lower cervical x 2' each    Other Manual Therapy mob with movement Lt rotation with U-joint sideglide on Lt, upglide facets on Rt, mid-cervical            Trigger Point Dry Needling - 08/17/19 0001    Consent Given? Yes    Education Handout Provided Previously provided    Muscles Treated Head and Neck Cervical multifidi;Upper trapezius    Dry Needling Comments bil, C7 multifidi bil    Upper Trapezius Response Twitch reponse elicited;Palpable increased muscle length    Cervical multifidi Response Twitch reponse elicited;Palpable increased muscle length                PT Education - 08/17/19 1231    Education Details Access Code: [email or web address]            PT Short Term Goals - 07/13/19 1539      PT SHORT TERM GOAL #1   Title Pt will report improved neck pain with daily tasks and sleep by at least 20%    Time 4    Period Weeks    Status New    Target Date 08/10/19      PT SHORT TERM GOAL #2   Title Pt will achieve painfree bil neck rotation of at least 45 deg to improve driving visibility.    Baseline -    Time 6    Period Weeks    Status New    Target Date 08/24/19      PT SHORT TERM GOAL #3   Title Pt will be ind with initial HEP and  mindful of daily posture.    Baseline -    Time 6    Period Weeks    Status New    Target Date 08/24/19      PT SHORT TERM GOAL #  4   Title -    Baseline -             PT Long Term Goals - 07/13/19 1540      PT LONG TERM GOAL #1   Title Pt will understand how to use tools of HEP for chronic neck pain management    Baseline -    Time 12    Period Weeks    Status New    Target Date 10/05/19      PT LONG TERM GOAL #2   Title Pt will demo improved neck flexion to 50 deg, ext 50 deg, bil rotation >/= 55 deg for improved functional range for driving and household tasks.    Baseline -    Time 12    Period Weeks    Status New    Target Date 10/05/19      PT LONG TERM GOAL #3   Title Pt will report at least 50% improvement in neck pain with daily tasks, sitting activites up to 20+ min, and sleep.    Baseline 1    Time 12    Period Weeks    Status New    Target Date 10/05/19      PT LONG TERM GOAL #4   Title Reduced FOTO to </= 53% limitation    Baseline 68% limited    Time 12    Period Weeks    Status New    Target Date 10/05/19      PT LONG TERM GOAL #5   Title Pt will achieve at least 4+/5 cervical and UE strength for improved postural support and bone density management.    Time 12    Period Weeks    Status New    Target Date 10/05/19                 Plan - 08/17/19 1331    Clinical Impression Statement Pt returns for first follow up visit after evaluation.  She has been compliant with spinal decompression.  PT discussed "joint noise" and relation to arthritis in neck with Pt.  PT taught Pt resting position of jaw to reduce tension in jaw and lateral neck.  Pt with diffuse signif arthritis throughout spine so PT used various manual techniques to improve mobility.  PT performed DN to bil upper traps and lower cervical multifii to improve ROM and reduce trigger point tension.  PT gave updated HEP for retraction with rotation and upper cervical nodding to work  on ROM.  Pt has signif rotation limitations which affects driving visibility.  She tolerated techniques well and demo'd more cervical ROM end of session before pain barrier.  Continue along POC with ongoing assessment.    Comorbidities RA, osteoporosis, CHF    Rehab Potential Good    PT Frequency 2x / week    PT Duration 12 weeks    PT Treatment/Interventions ADLs/Self Care Home Management;Cryotherapy;Electrical Stimulation;Moist Heat;Traction;Neuromuscular re-education;Therapeutic exercise;Therapeutic activities;Patient/family education;Joint Manipulations;Spinal Manipulations;Passive range of motion;Dry needling;Manual techniques;Taping    PT Next Visit Plan f/u on DN and manual therapy, DN SO, cervical mobs with movement and manual traction, measure neck rotation for goals, give yellow scap strength (row, ext)    PT Home Exercise Plan Access Code: [email or web address]    Consulted and Agree with Plan of Care Patient           Patient will benefit from skilled therapeutic intervention in order to improve the following deficits and impairments:     Visit  Diagnosis: Cervicalgia  Abnormal posture  Muscle weakness (generalized)     Problem List Patient Active Problem List   Diagnosis Date Noted  . Hypoxia 03/05/2019  . Derangement of posterior horn of medial meniscus 02/02/2019  . Derangement of posterior horn of lateral meniscus 02/02/2019  . Meniscal cyst, unspecified laterality 12/01/2018  . Torn medial meniscus 12/01/2018  . Primary osteoarthritis of left knee 12/01/2018  . Coronary artery disease involving native coronary artery of native heart without angina pectoris 09/14/2018  . Chronic diastolic (congestive) heart failure (HCC)   . Chronic pain of left knee 05/15/2018  . Mass of left knee 05/15/2018  . Non-ST elevation (NSTEMI) myocardial infarction (HCC)   . Chest pain 01/25/2018  . RA (rheumatoid arthritis) (HCC) 01/25/2018  . Hyperlipidemia 01/25/2018  . Hypokalemia  01/25/2018  . Depression with anxiety 01/25/2018  . Tobacco abuse 01/25/2018  . Lower extremity cellulitis 01/25/2018  . Benzodiazepine dependence (HCC)   . MDD (major depressive disorder), recurrent severe, without psychosis (HCC) 10/02/2017  . CTS (carpal tunnel syndrome) 06/08/2014    Morton Peters, PT 08/17/19 1:37 PM   [facility] [address] [place], [place], [address] Phone: [phone]   Fax:  [phone]  Name: Meghan Schwartz MRN: [id number] Date of Birth: 1950-07-16

## 2019-08-17 NOTE — Patient Instructions (Signed)
Access Code: [email or web address] URL: [email or web address]: 06/22/2021Prepared by: Loistine Simas BeuhringExercises  Seated Cervical Retraction and Rotation - 3 x daily - 7 x weekly - 1 sets - 10 reps  Seated Deep Neck Flexor Nods - 3 x daily - 7 x weekly - 1 sets - 3 reps - 10 hold

## 2019-08-24 ENCOUNTER — Ambulatory Visit: Payer: Medicare PPO | Admitting: Physical Therapy

## 2019-08-24 ENCOUNTER — Other Ambulatory Visit: Payer: Self-pay

## 2019-08-24 ENCOUNTER — Encounter: Payer: Self-pay | Admitting: Physical Therapy

## 2019-08-24 DIAGNOSIS — M542 Cervicalgia: Secondary | ICD-10-CM | POA: Diagnosis not present

## 2019-08-24 DIAGNOSIS — M6281 Muscle weakness (generalized): Secondary | ICD-10-CM

## 2019-08-24 DIAGNOSIS — R293 Abnormal posture: Secondary | ICD-10-CM

## 2019-08-24 NOTE — Therapy (Signed)
[facility] [address] [place], [place], [address] Phone: [phone]   Fax:  [phone]  Physical Therapy Treatment  Patient Details  Name: Meghan Schwartz MRN: [id number] Date of Birth: [birth year] Referring Provider (PT): Shirlean Mylar, MD   Encounter Date: 08/24/2019   PT End of Session - 08/24/19 1150    Visit Number 3    Date for PT Re-Evaluation 10/05/19    Authorization Type [employer]    Authorization Time Period Cohere approval 12 visits 5/18-8/10    Authorization - Visit Number 3    Authorization - Number of Visits 12    Progress Note Due on Visit 10    PT Start Time 1147    PT Stop Time 1233    PT Time Calculation (min) 46 min    Activity Tolerance Patient tolerated treatment well    Behavior During Therapy [facility] for tasks assessed/performed           Past Medical History:  Diagnosis Date  . Allergic rhinitis   . Anxiety   . Benzodiazepine dependence (HCC)   . Bruxism   . CAD (coronary artery disease)    S/p NSTEMI 01/2018 >> LHC: oLAD 20, pLAD 20; oD1 20, oLCx 40, pRCA 99 >> PCI: DES to prox RCA // Echo 01/2018: EF 50-55; prob inf-lat and inf HK  . Chronic diastolic heart failure    Echocardiogram 07/2018: EF 55-60, grade 1 diastolic dysfunction, normal wall motion, normal GLS (-22.3), PASP 28  . Chronic foot pain    bunions, torn ligaments-on chronic pain medication  . CTS (carpal tunnel syndrome) 06/08/2014  . Depression   . High cholesterol   . Hypertension   . MDD (major depressive disorder) 10/02/2017  . Migraine   . Myocardial infarction (HCC) 02/01/2018  . Pneumonia 1986   left lung  . RA (rheumatoid arthritis) (HCC)     Past Surgical History:  Procedure Laterality Date  . BTL  2000  . CARPAL TUNNEL RELEASE Left   . CESAREAN SECTION  1978. 1983  . CORONARY STENT INTERVENTION N/A 01/27/2018   Procedure: CORONARY STENT INTERVENTION;  Surgeon: Kathleene Hazel, MD;  [facility]  INVASIVE CV LAB;  Service: Cardiovascular;  Laterality: N/A;  . eye implants    . FOOT SURGERY Left 2008  . front teeth removed after injury    . KNEE ARTHROSCOPY WITH LATERAL MENISECTOMY Left 02/02/2019   Procedure: LEFT KNEE ARTHROSCOPY, MEDIAL AND LATERAL MENISECTOMY, EXCISION OF LEFT LATERAL MENISCAL CYST;  Surgeon: Valeria Batman, MD;  Location: [facility];  Service: Orthopedics;  Laterality: Left;  . LEFT HEART CATH AND CORONARY ANGIOGRAPHY N/A 01/27/2018   Procedure: LEFT HEART CATH AND CORONARY ANGIOGRAPHY;  Surgeon: Kathleene Hazel, MD;  Location: [facility] INVASIVE CV LAB;  Service: Cardiovascular;  Laterality: N/A;  . pre cancerous lesion removed from forehead    . TOTAL ABDOMINAL HYSTERECTOMY W/ BILATERAL SALPINGOOPHORECTOMY  2002   Dr. Jackelyn Knife  . VAGINAL HYSTERECTOMY  2001    There were no vitals filed for this visit.   Subjective Assessment - 08/24/19 1149    Subjective DN helped last time.  I didn't get too sore.  Lt side is improved but Rt side is still giving me trouble.    Pertinent History RA, foot deformity with rheumatoid drift, LE edema    Limitations House hold activities;Lifting;Sitting    How long can you sit comfortably? 10 min    Diagnostic tests MRI 2020: Mod multilevel  degenerative changes throughoutc-spine, most prominent at C6-7. Mod to severe bilfacet arthropathy. Mild degenerative foraminal stenosis on the right at C3-4 and C4-5.    Currently in Pain? Yes    Pain Score 2     Pain Location Neck    Pain Orientation Right;Lateral    Pain Descriptors / Indicators Tightness;Sharp;Aching    Pain Type Chronic pain    Pain Onset More than a month ago    Pain Frequency Constant    Aggravating Factors  movement, driving    Pain Relieving Factors heat, laying down, meds    Effect of Pain on Daily Activities driving              [facility] PT Assessment - 08/24/19 0001      AROM   Cervical Flexion 40    Cervical Extension 50    Cervical - Right Rotation 40     Cervical - Left Rotation 30                         [facility] Adult PT Treatment/Exercise - 08/24/19 0001      Neck Exercises: Supine   Cervical Isometrics Right lateral flexion;Left lateral flexion;Right rotation;Left rotation;5 secs;1 rep    Neck Retraction 5 reps;5 secs    Cervical Rotation Both;10 reps    Cervical Rotation Limitations in retraction    Other Supine Exercise supine scapular retraction x 10 reps, 3 sec holds      Manual Therapy   Manual Therapy Joint mobilization    Joint Mobilization Rt O/A and A/A joint Gr II/III for ext/rot    Soft tissue mobilization Rt SO    Manual Traction mid and lower c-spine Gr II/III    Other Manual Therapy mob with movement bil upglides mid-cervical with rotation, supine            Trigger Point Dry Needling - 08/24/19 0001    Consent Given? Yes    Education Handout Provided Previously provided    Muscles Treated Head and Neck Suboccipitals;Cervical multifidi    Dry Needling Comments Rt C6/7 multifidus, Rt SO only    Suboccipitals Response Palpable increased muscle length    Cervical multifidi Response Twitch reponse elicited;Palpable increased muscle length                PT Education - 08/24/19 1203    Education Details Access Code: [email or web address]    Person(s) Educated Patient    Methods Explanation;Demonstration;Handout;Verbal cues    Comprehension Verbalized understanding;Verbal cues required            PT Short Term Goals - 08/24/19 1237      PT SHORT TERM GOAL #1   Title Pt will report improved neck pain with daily tasks and sleep by at least 20%    Status On-going      PT SHORT TERM GOAL #2   Title Pt will achieve painfree bil neck rotation of at least 45 deg to improve driving visibility.    Status On-going      PT SHORT TERM GOAL #3   Title Pt will be ind with initial HEP and mindful of daily posture.    Status Achieved             PT Long Term Goals - 07/13/19 1540      PT LONG TERM  GOAL #1   Title Pt will understand how to use tools of HEP for chronic neck pain management  Baseline -    Time 12    Period Weeks    Status New    Target Date 10/05/19      PT LONG TERM GOAL #2   Title Pt will demo improved neck flexion to 50 deg, ext 50 deg, bil rotation >/= 55 deg for improved functional range for driving and household tasks.    Baseline -    Time 12    Period Weeks    Status New    Target Date 10/05/19      PT LONG TERM GOAL #3   Title Pt will report at least 50% improvement in neck pain with daily tasks, sitting activites up to 20+ min, and sleep.    Baseline 1    Time 12    Period Weeks    Status New    Target Date 10/05/19      PT LONG TERM GOAL #4   Title Reduced FOTO to </= 53% limitation    Baseline 68% limited    Time 12    Period Weeks    Status New    Target Date 10/05/19      PT LONG TERM GOAL #5   Title Pt will achieve at least 4+/5 cervical and UE strength for improved postural support and bone density management.    Time 12    Period Weeks    Status New    Target Date 10/05/19                 Plan - 08/24/19 1234    Clinical Impression Statement Pt with ongoing limitation in cervical ROM in all planes. She reports some decrease in pain during day since starting PT.  She admitted not doing HEP all week since last here.  She tolerated initial DN well so PT performed DN to Rt SO today with good release - Pt reported deep ache with needling.  PT progressed HEP to include ROM and isometrics in supine.  Manual techniques were used for traction and joint mobs with and without movement.  Pt with increased cervial ext and Lt rotation end of session. Continue along POC with additional scapular strength next visit.    Comorbidities RA, osteoporosis, CHF    Rehab Potential Good    PT Frequency 2x / week    PT Duration 12 weeks    PT Treatment/Interventions ADLs/Self Care Home Management;Cryotherapy;Electrical Stimulation;Moist  Heat;Traction;Neuromuscular re-education;Therapeutic exercise;Therapeutic activities;Patient/family education;Joint Manipulations;Spinal Manipulations;Passive range of motion;Dry needling;Manual techniques;Taping    PT Next Visit Plan measure neck ROM, DN bil multifidi, revisit Rt SO joint mobs and SO tension, f/u on isometrics and ROM, try UBE and scap band strength yellow    PT Home Exercise Plan Access Code: [email or web address]    Consulted and Agree with Plan of Care Patient           Patient will benefit from skilled therapeutic intervention in order to improve the following deficits and impairments:     Visit Diagnosis: Cervicalgia  Abnormal posture  Muscle weakness (generalized)     Problem List Patient Active Problem List   Diagnosis Date Noted  . Hypoxia 03/05/2019  . Derangement of posterior horn of medial meniscus 02/02/2019  . Derangement of posterior horn of lateral meniscus 02/02/2019  . Meniscal cyst, unspecified laterality 12/01/2018  . Torn medial meniscus 12/01/2018  . Primary osteoarthritis of left knee 12/01/2018  . Coronary artery disease involving native coronary artery of native heart without angina pectoris 09/14/2018  . Chronic diastolic (congestive)  heart failure (HCC)   . Chronic pain of left knee 05/15/2018  . Mass of left knee 05/15/2018  . Non-ST elevation (NSTEMI) myocardial infarction (HCC)   . Chest pain 01/25/2018  . RA (rheumatoid arthritis) (HCC) 01/25/2018  . Hyperlipidemia 01/25/2018  . Hypokalemia 01/25/2018  . Depression with anxiety 01/25/2018  . Tobacco abuse 01/25/2018  . Lower extremity cellulitis 01/25/2018  . Benzodiazepine dependence (HCC)   . MDD (major depressive disorder), recurrent severe, without psychosis (HCC) 10/02/2017  . CTS (carpal tunnel syndrome) 06/08/2014    Morton Peters, PT 08/24/19 12:39 PM   [facility] [address] [place], [place],  [address] Phone: [phone]   Fax:  [phone]  Name: Meghan Schwartz MRN: [id number] Date of Birth: [birth year]

## 2019-08-24 NOTE — Patient Instructions (Signed)
Access Code: [email or web address] URL: [email or web address]: 06/29/2021Prepared by: Loistine Simas BeuhringExercises  Seated Cervical Retraction and Rotation - 3 x daily - 7 x weekly - 1 sets - 10 reps  Seated Deep Neck Flexor Nods - 3 x daily - 7 x weekly - 3 sets - 3 reps - 10 hold  Seated Isometric Cervical Sidebending - 1 x daily - 7 x weekly - 3 sets - 5 reps - 5 hold  Seated Isometric Cervical Rotation - 1 x daily - 7 x weekly - 3 sets - 5 reps - 5 hold  Seated Isometric Cervical Extension - 1 x daily - 7 x weekly - 3 sets - 5 reps - 5 hold  Supine Cervical Rotation AROM on Pillow - 1 x daily - 7 x weekly - 2 sets - 10 reps

## 2019-08-26 ENCOUNTER — Ambulatory Visit: Payer: Medicare PPO | Attending: Family Medicine | Admitting: Physical Therapy

## 2019-08-26 ENCOUNTER — Encounter: Payer: Self-pay | Admitting: Physical Therapy

## 2019-08-26 ENCOUNTER — Other Ambulatory Visit: Payer: Self-pay

## 2019-08-26 DIAGNOSIS — M25562 Pain in left knee: Secondary | ICD-10-CM | POA: Diagnosis not present

## 2019-08-26 DIAGNOSIS — R293 Abnormal posture: Secondary | ICD-10-CM

## 2019-08-26 DIAGNOSIS — M542 Cervicalgia: Secondary | ICD-10-CM | POA: Insufficient documentation

## 2019-08-26 DIAGNOSIS — M6281 Muscle weakness (generalized): Secondary | ICD-10-CM | POA: Diagnosis not present

## 2019-08-26 DIAGNOSIS — G8929 Other chronic pain: Secondary | ICD-10-CM | POA: Insufficient documentation

## 2019-08-26 NOTE — Therapy (Signed)
[facility] [address], [address] [place], [place], [address] Phone: [phone]   Fax:  [phone]  Physical Therapy Treatment  Patient Details  Name: Meghan Schwartz MRN: [id number] Date of Birth: 1950/11/24 Referring Provider (PT): Shirlean Mylar, MD   Encounter Date: 08/26/2019   PT End of Session - 08/26/19 1607    Visit Number 4    Date for PT Re-Evaluation 10/05/19    Authorization Type [employer]    Authorization Time Period Cohere approval 12 visits 5/18-8/10    Authorization - Visit Number 4    Authorization - Number of Visits 12    Progress Note Due on Visit 10    PT Start Time 1532    PT Stop Time 1617    PT Time Calculation (min) 45 min    Activity Tolerance Patient tolerated treatment well    Behavior During Therapy [facility] for tasks assessed/performed           Past Medical History:  Diagnosis Date  . Allergic rhinitis   . Anxiety   . Benzodiazepine dependence (HCC)   . Bruxism   . CAD (coronary artery disease)    S/p NSTEMI 01/2018 >> LHC: oLAD 20, pLAD 20; oD1 20, oLCx 40, pRCA 99 >> PCI: DES to prox RCA // Echo 01/2018: EF 50-55; prob inf-lat and inf HK  . Chronic diastolic heart failure    Echocardiogram 07/2018: EF 55-60, grade 1 diastolic dysfunction, normal wall motion, normal GLS (-22.3), PASP 28  . Chronic foot pain    bunions, torn ligaments-on chronic pain medication  . CTS (carpal tunnel syndrome) 06/08/2014  . Depression   . High cholesterol   . Hypertension   . MDD (major depressive disorder) 10/02/2017  . Migraine   . Myocardial infarction (HCC) 02/01/2018  . Pneumonia 1986   left lung  . RA (rheumatoid arthritis) (HCC)     Past Surgical History:  Procedure Laterality Date  . BTL  2000  . CARPAL TUNNEL RELEASE Left   . CESAREAN SECTION  1978. 1983  . CORONARY STENT INTERVENTION N/A 01/27/2018   Procedure: CORONARY STENT INTERVENTION;  Surgeon: Kathleene Hazel, MD;  [facility]: [facility]  INVASIVE CV LAB;  Service: Cardiovascular;  Laterality: N/A;  . eye implants    . FOOT SURGERY Left 2008  . front teeth removed after injury    . KNEE ARTHROSCOPY WITH LATERAL MENISECTOMY Left 02/02/2019   Procedure: LEFT KNEE ARTHROSCOPY, MEDIAL AND LATERAL MENISECTOMY, EXCISION OF LEFT LATERAL MENISCAL CYST;  Surgeon: Valeria Batman, MD;  Location: [facility];  Service: Orthopedics;  Laterality: Left;  . LEFT HEART CATH AND CORONARY ANGIOGRAPHY N/A 01/27/2018   Procedure: LEFT HEART CATH AND CORONARY ANGIOGRAPHY;  Surgeon: Kathleene Hazel, MD;  [facility]: [facility] INVASIVE CV LAB;  Service: Cardiovascular;  Laterality: N/A;  . pre cancerous lesion removed from forehead    . TOTAL ABDOMINAL HYSTERECTOMY W/ BILATERAL SALPINGOOPHORECTOMY  2002   Dr. Jackelyn Knife  . VAGINAL HYSTERECTOMY  2001    There were no vitals filed for this visit.   Subjective Assessment - 08/26/19 1535    Subjective I did well with DN last time.  Looking Rt is still hard for me but looking Lt is better.  I'm getting better overall.  I am tense and stressed today from babysitting grandkids so pain is up a little now.    Pertinent History RA, foot deformity with rheumatoid drift, LE edema    Limitations House hold activities;Lifting;Sitting  How long can you sit comfortably? 10 min    Diagnostic tests MRI 2020: Mod multilevel degenerative changes throughoutc-spine, most prominent at C6-7. Mod to severe bilfacet arthropathy. Mild degenerative foraminal stenosis on the right at C3-4 and C4-5.    Patient Stated Goals better neck mobility, less pain with daily tasks, improved sleep    Currently in Pain? Yes    Pain Score 4     Pain Location Neck    Pain Orientation Right;Left    Pain Descriptors / Indicators Tightness;Aching    Pain Type Chronic pain    Pain Onset More than a month ago    Pain Frequency Constant    Aggravating Factors  movement, driving, stress              [facility] PT Assessment - 08/26/19 0001       AROM   Cervical - Right Rotation 50    Cervical - Left Rotation 40                         [facility] Adult PT Treatment/Exercise - 08/26/19 0001      Exercises   Exercises Neck;Shoulder      Neck Exercises: Machines for Strengthening   UBE (Upper Arm Bike) L1 x 2 min fwd, 1' bwd, PT present to cue posture      Neck Exercises: Seated   Cervical Rotation Both;5 reps    Cervical Rotation Limitations in retraction    Other Seated Exercise AROM bil SB x 5 Rt/Lt    Other Seated Exercise seated trunk rot with UE reach and head turn 3x5 sec      Neck Exercises: Supine   Cervical Isometrics Right lateral flexion;Left lateral flexion;Right rotation;Left rotation;5 secs    Cervical Isometrics Limitations 3 reps each      Shoulder Exercises: Supine   Horizontal ABduction Strengthening;Both;15 reps;Theraband    Theraband Level (Shoulder Horizontal ABduction) Level 1 (Yellow)    Horizontal ABduction Limitations in neck retraction, PT VCs for scapulae into mat table and away from ears      Shoulder Exercises: Seated   Other Seated Exercises backward shoulder rows x 10 reps    Other Seated Exercises scapular retraction x 10 reps, TCs for squeeze intrascap region      Modalities   Modalities Electrical Stimulation;Moist Heat      Moist Heat Therapy   Number Minutes Moist Heat 10 Minutes    Moist Heat Location Cervical      Electrical Stimulation   Electrical Stimulation Location neck    Electrical Stimulation Action IFC    Electrical Stimulation Parameters 10 MIN    Electrical Stimulation Goals Pain                    PT Short Term Goals - 08/24/19 1237      PT SHORT TERM GOAL #1   Title Pt will report improved neck pain with daily tasks and sleep by at least 20%    Status On-going      PT SHORT TERM GOAL #2   Title Pt will achieve painfree bil neck rotation of at least 45 deg to improve driving visibility.    Status On-going      PT SHORT TERM GOAL #3    Title Pt will be ind with initial HEP and mindful of daily posture.    Status Achieved             PT Long  Term Goals - 07/13/19 1540      PT LONG TERM GOAL #1   Title Pt will understand how to use tools of HEP for chronic neck pain management    Baseline -    Time 12    Period Weeks    Status New    Target Date 10/05/19      PT LONG TERM GOAL #2   Title Pt will demo improved neck flexion to 50 deg, ext 50 deg, bil rotation >/= 55 deg for improved functional range for driving and household tasks.    Baseline -    Time 12    Period Weeks    Status New    Target Date 10/05/19      PT LONG TERM GOAL #3   Title Pt will report at least 50% improvement in neck pain with daily tasks, sitting activites up to 20+ min, and sleep.    Baseline 1    Time 12    Period Weeks    Status New    Target Date 10/05/19      PT LONG TERM GOAL #4   Title Reduced FOTO to </= 53% limitation    Baseline 68% limited    Time 12    Period Weeks    Status New    Target Date 10/05/19      PT LONG TERM GOAL #5   Title Pt will achieve at least 4+/5 cervical and UE strength for improved postural support and bone density management.    Time 12    Period Weeks    Status New    Target Date 10/05/19                 Plan - 08/26/19 1607    Clinical Impression Statement Pt arrived with increased stress and tension in neck having babysat grandkids today.  Overall, despite today, she reports imroved pain and motion.  PT measured rotation and noted increased cervial rotation by 10 deg bil (50 Rt, 40 Rt).  PT progressed isometrics to seated vs supine and initiated scapular activation in sitting and supine with yellow band.  PT updated HEP to include horiz abd in supine with yellow band.  Pt with good understanding of scap retraction/depression to avoid upper trap tension bil.  PT trialed IFC and heat today which Pt felt help her relax and reduce tension.  Continue along POC.    Comorbidities RA,  osteoporosis, CHF    Rehab Potential Good    PT Frequency 2x / week    PT Treatment/Interventions ADLs/Self Care Home Management;Cryotherapy;Electrical Stimulation;Moist Heat;Traction;Neuromuscular re-education;Therapeutic exercise;Therapeutic activities;Patient/family education;Joint Manipulations;Spinal Manipulations;Passive range of motion;Dry needling;Manual techniques;Taping    PT Next Visit Plan DN bil multifidi cervical and upper tspine, continue cerivcal and scap stab and A/ROM of spine, manual tech, TENS/heat    PT Home Exercise Plan Access Code: [email or web address]    Consulted and Agree with Plan of Care Patient           Patient will benefit from skilled therapeutic intervention in order to improve the following deficits and impairments:     Visit Diagnosis: Cervicalgia  Abnormal posture  Muscle weakness (generalized)     Problem List Patient Active Problem List   Diagnosis Date Noted  . Hypoxia 03/05/2019  . Derangement of posterior horn of medial meniscus 02/02/2019  . Derangement of posterior horn of lateral meniscus 02/02/2019  . Meniscal cyst, unspecified laterality 12/01/2018  . Torn medial meniscus 12/01/2018  . Primary osteoarthritis  of left knee 12/01/2018  . Coronary artery disease involving native coronary artery of native heart without angina pectoris 09/14/2018  . Chronic diastolic (congestive) heart failure (HCC)   . Chronic pain of left knee 05/15/2018  . Mass of left knee 05/15/2018  . Non-ST elevation (NSTEMI) myocardial infarction (HCC)   . Chest pain 01/25/2018  . RA (rheumatoid arthritis) (HCC) 01/25/2018  . Hyperlipidemia 01/25/2018  . Hypokalemia 01/25/2018  . Depression with anxiety 01/25/2018  . Tobacco abuse 01/25/2018  . Lower extremity cellulitis 01/25/2018  . Benzodiazepine dependence (HCC)   . MDD (major depressive disorder), recurrent severe, without psychosis (HCC) 10/02/2017  . CTS (carpal tunnel syndrome) 06/08/2014    Morton Peters, PT 08/26/19 4:12 PM   [facility] [address], [address] [place], [place], [address] Phone: [phone]   Fax:  [phone]  Name: THEODOSIA BAHENA MRN: [id number] Date of Birth: 09/16/1950

## 2019-08-31 ENCOUNTER — Other Ambulatory Visit: Payer: Self-pay

## 2019-08-31 ENCOUNTER — Encounter: Payer: Self-pay | Admitting: Physical Therapy

## 2019-08-31 ENCOUNTER — Ambulatory Visit: Payer: Medicare PPO | Admitting: Physical Therapy

## 2019-08-31 DIAGNOSIS — M542 Cervicalgia: Secondary | ICD-10-CM | POA: Diagnosis not present

## 2019-08-31 DIAGNOSIS — G8929 Other chronic pain: Secondary | ICD-10-CM | POA: Diagnosis not present

## 2019-08-31 DIAGNOSIS — M6281 Muscle weakness (generalized): Secondary | ICD-10-CM | POA: Diagnosis not present

## 2019-08-31 DIAGNOSIS — M25562 Pain in left knee: Secondary | ICD-10-CM | POA: Diagnosis not present

## 2019-08-31 DIAGNOSIS — R293 Abnormal posture: Secondary | ICD-10-CM | POA: Diagnosis not present

## 2019-08-31 NOTE — Therapy (Signed)
[facility] [address], [address] [place], [place], [address] Phone: [phone]   Fax:  [phone]  Physical Therapy Treatment  Patient Details  Name: Meghan Schwartz MRN: [id number] Date of Birth: [birth year] Referring Provider (PT): Shirlean Mylar, MD   Encounter Date: 08/31/2019   PT End of Session - 08/31/19 1720    Visit Number 5    Date for PT Re-Evaluation 10/05/19    Authorization Type [employer]    Authorization Time Period Cohere approval 12 visits 5/18-8/10    Authorization - Visit Number 5    Authorization - Number of Visits 12    Progress Note Due on Visit 10    PT Start Time 1445    PT Stop Time 1538    PT Time Calculation (min) 53 min    Activity Tolerance Patient tolerated treatment well    Behavior During Therapy [facility] for tasks assessed/performed           Past Medical History:  Diagnosis Date  . Allergic rhinitis   . Anxiety   . Benzodiazepine dependence (HCC)   . Bruxism   . CAD (coronary artery disease)    S/p NSTEMI 01/2018 >> LHC: oLAD 20, pLAD 20; oD1 20, oLCx 40, pRCA 99 >> PCI: DES to prox RCA // Echo 01/2018: EF 50-55; prob inf-lat and inf HK  . Chronic diastolic heart failure    Echocardiogram 07/2018: EF 55-60, grade 1 diastolic dysfunction, normal wall motion, normal GLS (-22.3), PASP 28  . Chronic foot pain    bunions, torn ligaments-on chronic pain medication  . CTS (carpal tunnel syndrome) 06/08/2014  . Depression   . High cholesterol   . Hypertension   . MDD (major depressive disorder) 10/02/2017  . Migraine   . Myocardial infarction (HCC) 02/01/2018  . Pneumonia 1986   left lung  . RA (rheumatoid arthritis) (HCC)     Past Surgical History:  Procedure Laterality Date  . BTL  2000  . CARPAL TUNNEL RELEASE Left   . CESAREAN SECTION  1978. 1983  . CORONARY STENT INTERVENTION N/A 01/27/2018   Procedure: CORONARY STENT INTERVENTION;  Surgeon: Kathleene Hazel, MD;  [facility]  INVASIVE CV LAB;  Service: Cardiovascular;  Laterality: N/A;  . eye implants    . FOOT SURGERY Left 2008  . front teeth removed after injury    . KNEE ARTHROSCOPY WITH LATERAL MENISECTOMY Left 02/02/2019   Procedure: LEFT KNEE ARTHROSCOPY, MEDIAL AND LATERAL MENISECTOMY, EXCISION OF LEFT LATERAL MENISCAL CYST;  Surgeon: Valeria Batman, MD;  Location: [facility];  Service: Orthopedics;  Laterality: Left;  . LEFT HEART CATH AND CORONARY ANGIOGRAPHY N/A 01/27/2018   Procedure: LEFT HEART CATH AND CORONARY ANGIOGRAPHY;  Surgeon: Kathleene Hazel, MD;  Location: [facility] INVASIVE CV LAB;  Service: Cardiovascular;  Laterality: N/A;  . pre cancerous lesion removed from forehead    . TOTAL ABDOMINAL HYSTERECTOMY W/ BILATERAL SALPINGOOPHORECTOMY  2002   Dr. Jackelyn Knife  . VAGINAL HYSTERECTOMY  2001    There were no vitals filed for this visit.   Subjective Assessment - 08/31/19 1451    Subjective I am having stabbing pains in the back of the neck today.    Pertinent History RA, foot deformity with rheumatoid drift, LE edema    Limitations House hold activities;Lifting;Sitting    How long can you sit comfortably? 10 min    Diagnostic tests MRI 2020: Mod multilevel degenerative changes throughoutc-spine, most prominent at C6-7. Mod to severe bilfacet  arthropathy. Mild degenerative foraminal stenosis on the right at C3-4 and C4-5.    Patient Stated Goals better neck mobility, less pain with daily tasks, improved sleep    Currently in Pain? Yes    Pain Score 5     Pain Location Neck    Pain Orientation Right;Mid    Pain Descriptors / Indicators Stabbing    Pain Type Chronic pain    Pain Onset More than a month ago    Aggravating Factors  movement, stress, driving    Effect of Pain on Daily Activities driving                             [facility] Adult PT Treatment/Exercise - 08/31/19 0001      Neck Exercises: Machines for Strengthening   UBE (Upper Arm Bike) L1 x 2' alt each min,  PT present to cue posture      Neck Exercises: Supine   Neck Retraction 10 reps    Cervical Rotation Both;5 reps    Cervical Rotation Limitations with contract/relax      Moist Heat Therapy   Number Minutes Moist Heat 10 Minutes    Moist Heat Location Cervical      Manual Therapy   Manual Therapy Manual Traction;Soft tissue mobilization    Soft tissue mobilization scalene strumming bil, Rt cervical multif elongation C5-T2    Manual Traction cervical Gr II/III            Trigger Point Dry Needling - 08/31/19 0001    Consent Given? Yes    Education Handout Provided Previously provided    Muscles Treated Head and Neck Cervical multifidi;Upper trapezius    Dry Needling Comments Rt C5-T2    Other Dry Needling bil upper trap    Upper Trapezius Response Twitch reponse elicited;Palpable increased muscle length    Cervical multifidi Response Twitch reponse elicited;Palpable increased muscle length                  PT Short Term Goals - 08/24/19 1237      PT SHORT TERM GOAL #1   Title Pt will report improved neck pain with daily tasks and sleep by at least 20%    Status On-going      PT SHORT TERM GOAL #2   Title Pt will achieve painfree bil neck rotation of at least 45 deg to improve driving visibility.    Status On-going      PT SHORT TERM GOAL #3   Title Pt will be ind with initial HEP and mindful of daily posture.    Status Achieved             PT Long Term Goals - 07/13/19 1540      PT LONG TERM GOAL #1   Title Pt will understand how to use tools of HEP for chronic neck pain management    Baseline -    Time 12    Period Weeks    Status New    Target Date 10/05/19      PT LONG TERM GOAL #2   Title Pt will demo improved neck flexion to 50 deg, ext 50 deg, bil rotation >/= 55 deg for improved functional range for driving and household tasks.    Baseline -    Time 12    Period Weeks    Status New    Target Date 10/05/19      PT LONG TERM  GOAL #3    Title Pt will report at least 50% improvement in neck pain with daily tasks, sitting activites up to 20+ min, and sleep.    Baseline 1    Time 12    Period Weeks    Status New    Target Date 10/05/19      PT LONG TERM GOAL #4   Title Reduced FOTO to </= 53% limitation    Baseline 68% limited    Time 12    Period Weeks    Status New    Target Date 10/05/19      PT LONG TERM GOAL #5   Title Pt will achieve at least 4+/5 cervical and UE strength for improved postural support and bone density management.    Time 12    Period Weeks    Status New    Target Date 10/05/19                 Plan - 08/31/19 1721    Clinical Impression Statement Pt with report of increased sharp pain in mid and right cervical region today.  PT performed DN and manual techniques to reduce spasm, TPs and elongate spine with signif twitch/release in Rt cervical multifidi and upper trap today.  Pt has been more compliant with HEP since last visit.  She continues to have signif restrictions in ROM in all cervical planes due to diffuse arthritis in spine.  DN has been biggest relief for Pt so far.  Continue along POC with ongoing assessment of response to interventions.    Comorbidities RA, osteoporosis, CHF    Rehab Potential Good    PT Frequency 2x / week    PT Duration 12 weeks    PT Treatment/Interventions ADLs/Self Care Home Management;Cryotherapy;Electrical Stimulation;Moist Heat;Traction;Neuromuscular re-education;Therapeutic exercise;Therapeutic activities;Patient/family education;Joint Manipulations;Spinal Manipulations;Passive range of motion;Dry needling;Manual techniques;Taping    PT Next Visit Plan review STGs, UBE, f/u on DN, mob Rt C5-T2 facets, cervical and scap stab, cervical A/ROM    PT Home Exercise Plan Access Code: [email or web address]    Consulted and Agree with Plan of Care Patient           Patient will benefit from skilled therapeutic intervention in order to improve the following deficits and  impairments:     Visit Diagnosis: Abnormal posture  Muscle weakness (generalized)     Problem List Patient Active Problem List   Diagnosis Date Noted  . Hypoxia 03/05/2019  . Derangement of posterior horn of medial meniscus 02/02/2019  . Derangement of posterior horn of lateral meniscus 02/02/2019  . Meniscal cyst, unspecified laterality 12/01/2018  . Torn medial meniscus 12/01/2018  . Primary osteoarthritis of left knee 12/01/2018  . Coronary artery disease involving native coronary artery of native heart without angina pectoris 09/14/2018  . Chronic diastolic (congestive) heart failure (HCC)   . Chronic pain of left knee 05/15/2018  . Mass of left knee 05/15/2018  . Non-ST elevation (NSTEMI) myocardial infarction (HCC)   . Chest pain 01/25/2018  . RA (rheumatoid arthritis) (HCC) 01/25/2018  . Hyperlipidemia 01/25/2018  . Hypokalemia 01/25/2018  . Depression with anxiety 01/25/2018  . Tobacco abuse 01/25/2018  . Lower extremity cellulitis 01/25/2018  . Benzodiazepine dependence (HCC)   . MDD (major depressive disorder), recurrent severe, without psychosis (HCC) 10/02/2017  . CTS (carpal tunnel syndrome) 06/08/2014    Morton Peters, PT 08/31/19 5:25 PM   [facility] [address], [address] [place], [place], [address] Phone: [phone]  Fax:  [phone]  Name: Meghan Schwartz MRN: [id number] Date of Birth: March 09, 1950

## 2019-09-03 ENCOUNTER — Encounter: Payer: Self-pay | Admitting: Physical Therapy

## 2019-09-03 ENCOUNTER — Other Ambulatory Visit: Payer: Self-pay

## 2019-09-03 ENCOUNTER — Ambulatory Visit: Payer: Medicare PPO | Admitting: Physical Therapy

## 2019-09-03 DIAGNOSIS — G8929 Other chronic pain: Secondary | ICD-10-CM | POA: Diagnosis not present

## 2019-09-03 DIAGNOSIS — M542 Cervicalgia: Secondary | ICD-10-CM

## 2019-09-03 DIAGNOSIS — M6281 Muscle weakness (generalized): Secondary | ICD-10-CM | POA: Diagnosis not present

## 2019-09-03 DIAGNOSIS — R293 Abnormal posture: Secondary | ICD-10-CM | POA: Diagnosis not present

## 2019-09-03 DIAGNOSIS — I5032 Chronic diastolic (congestive) heart failure: Secondary | ICD-10-CM | POA: Diagnosis not present

## 2019-09-03 DIAGNOSIS — M25562 Pain in left knee: Secondary | ICD-10-CM | POA: Diagnosis not present

## 2019-09-03 NOTE — Therapy (Signed)
[facility] [address], [address] [place], [place], [address] Phone: [phone]   Fax:  [phone]  Physical Therapy Treatment  Patient Details  Name: Meghan Schwartz MRN: [id number] Date of Birth: 03-24-1950 Referring Provider (PT): Shirlean Mylar, MD   Encounter Date: 09/03/2019   PT End of Session - 09/03/19 1104    Visit Number 6    Date for PT Re-Evaluation 10/05/19    Authorization Type [employer]    Authorization Time Period Cohere approval 12 visits 5/18-8/10    Authorization - Visit Number 6    Authorization - Number of Visits 12    Progress Note Due on Visit 10    PT Start Time 1020    PT Stop Time 1100    PT Time Calculation (min) 40 min    Activity Tolerance Patient tolerated treatment well    Behavior During Therapy [facility] for tasks assessed/performed           Past Medical History:  Diagnosis Date  . Allergic rhinitis   . Anxiety   . Benzodiazepine dependence (HCC)   . Bruxism   . CAD (coronary artery disease)    S/p NSTEMI 01/2018 >> LHC: oLAD 20, pLAD 20; oD1 20, oLCx 40, pRCA 99 >> PCI: DES to prox RCA // Echo 01/2018: EF 50-55; prob inf-lat and inf HK  . Chronic diastolic heart failure    Echocardiogram 07/2018: EF 55-60, grade 1 diastolic dysfunction, normal wall motion, normal GLS (-22.3), PASP 28  . Chronic foot pain    bunions, torn ligaments-on chronic pain medication  . CTS (carpal tunnel syndrome) 06/08/2014  . Depression   . High cholesterol   . Hypertension   . MDD (major depressive disorder) 10/02/2017  . Migraine   . Myocardial infarction (HCC) 02/01/2018  . Pneumonia 1986   left lung  . RA (rheumatoid arthritis) (HCC)     Past Surgical History:  Procedure Laterality Date  . BTL  2000  . CARPAL TUNNEL RELEASE Left   . CESAREAN SECTION  1978. 1983  . CORONARY STENT INTERVENTION N/A 01/27/2018   Procedure: CORONARY STENT INTERVENTION;  Surgeon: Kathleene Hazel, MD;  [facility]: [facility]  INVASIVE CV LAB;  Service: Cardiovascular;  Laterality: N/A;  . eye implants    . FOOT SURGERY Left 2008  . front teeth removed after injury    . KNEE ARTHROSCOPY WITH LATERAL MENISECTOMY Left 02/02/2019   Procedure: LEFT KNEE ARTHROSCOPY, MEDIAL AND LATERAL MENISECTOMY, EXCISION OF LEFT LATERAL MENISCAL CYST;  Surgeon: Valeria Batman, MD;  Location: [facility];  Service: Orthopedics;  Laterality: Left;  . LEFT HEART CATH AND CORONARY ANGIOGRAPHY N/A 01/27/2018   Procedure: LEFT HEART CATH AND CORONARY ANGIOGRAPHY;  Surgeon: Kathleene Hazel, MD;  [facility]: [facility] INVASIVE CV LAB;  Service: Cardiovascular;  Laterality: N/A;  . pre cancerous lesion removed from forehead    . TOTAL ABDOMINAL HYSTERECTOMY W/ BILATERAL SALPINGOOPHORECTOMY  2002   Dr. Jackelyn Knife  . VAGINAL HYSTERECTOMY  2001    There were no vitals filed for this visit.   Subjective Assessment - 09/03/19 1021    Subjective I did great after last time and still have no pain on Rt side.  Relief has lasted.    Pertinent History RA, foot deformity with rheumatoid drift, LE edema    Limitations House hold activities;Lifting;Sitting    How long can you sit comfortably? 10 min    Diagnostic tests MRI 2020: Mod multilevel degenerative changes throughoutc-spine, most prominent  at C6-7. Mod to severe bilfacet arthropathy. Mild degenerative foraminal stenosis on the right at C3-4 and C4-5.    Patient Stated Goals better neck mobility, less pain with daily tasks, improved sleep    Currently in Pain? Yes    Pain Score 1     Pain Location Neck    Pain Orientation Left    Pain Descriptors / Indicators Sharp    Pain Type Chronic pain    Pain Onset More than a month ago    Pain Frequency Intermittent    Aggravating Factors  turning head, looking up/down, driving, stress    Pain Relieving Factors heat, laying down, dropping shoulders              [facility] PT Assessment - 09/03/19 0001      AROM   Cervical - Right Rotation 50   less  pain than previous measurement of same ROm   Cervical - Left Rotation 40                         [facility] Adult PT Treatment/Exercise - 09/03/19 0001      Neuro Re-ed    Neuro Re-ed Details  segmental retraction with manual resistance by PT throughout c-spine      Neck Exercises: Seated   Cervical Rotation Right;Left;10 reps    Cervical Rotation Limitations in retraction      Neck Exercises: Supine   Cervical Isometrics Extension;Right rotation;Left rotation;5 secs;1 rep    Neck Retraction 5 reps;10 secs      Shoulder Exercises: Supine   Horizontal ABduction Strengthening;Both;10 reps;Theraband    Theraband Level (Shoulder Horizontal ABduction) Level 1 (Yellow)    Diagonals Limitations bil 2x3 reps     Other Supine Exercises bil scap retraction/depression elongation with neck retraction 5x10 sec      Manual Therapy   Manual Therapy Soft tissue mobilization;Joint mobilization    Joint Mobilization Lt first rib depression Gr II/III, mob with movement lower cspine and upper tspine for rotation    Soft tissue mobilization scalene stripping, upper trap on Lt, intrascapular release Lt                    PT Short Term Goals - 08/24/19 1237      PT SHORT TERM GOAL #1   Title Pt will report improved neck pain with daily tasks and sleep by at least 20%    Status On-going      PT SHORT TERM GOAL #2   Title Pt will achieve painfree bil neck rotation of at least 45 deg to improve driving visibility.    Status On-going      PT SHORT TERM GOAL #3   Title Pt will be ind with initial HEP and mindful of daily posture.    Status Achieved             PT Long Term Goals - 07/13/19 1540      PT LONG TERM GOAL #1   Title Pt will understand how to use tools of HEP for chronic neck pain management    Baseline -    Time 12    Period Weeks    Status New    Target Date 10/05/19      PT LONG TERM GOAL #2   Title Pt will demo improved neck flexion to 50 deg, ext 50  deg, bil rotation >/= 55 deg for improved functional range for driving and household tasks.  Baseline -    Time 12    Period Weeks    Status New    Target Date 10/05/19      PT LONG TERM GOAL #3   Title Pt will report at least 50% improvement in neck pain with daily tasks, sitting activites up to 20+ min, and sleep.    Baseline 1    Time 12    Period Weeks    Status New    Target Date 10/05/19      PT LONG TERM GOAL #4   Title Reduced FOTO to </= 53% limitation    Baseline 68% limited    Time 12    Period Weeks    Status New    Target Date 10/05/19      PT LONG TERM GOAL #5   Title Pt will achieve at least 4+/5 cervical and UE strength for improved postural support and bone density management.    Time 12    Period Weeks    Status New    Target Date 10/05/19                 Plan - 09/03/19 1155    Clinical Impression Statement Pt arrived with report of reduced Rt neck pain since last visit's DN.  She continues to have limited cervical rotation but this is functional range and now she can perform this range with less pain.  PT focused on manual techniques for further improvement of cervical stabilizers segmentally (neuro re-ed) and improved segmental mobility and soft tissue elongation.  First rib was elevated on Lt and TP was present in Lt upper trap but this improved with manual techniques today.  Pt became dizzy end of session upon standing from supine and lost balance but regained control with PT assistance.  PT had Pt sit back down and waited with Pt until she felt steady.  PT ambulated to waiting room with Pt for close supervision but Pt reported she felt fine and was ok to go.  Continue along POC with ongoing assessment of response to interventions.    Comorbidities RA, osteoporosis, CHF    Rehab Potential Good    PT Frequency 2x / week    PT Duration 12 weeks    PT Treatment/Interventions ADLs/Self Care Home Management;Cryotherapy;Electrical Stimulation;Moist  Heat;Traction;Neuromuscular re-education;Therapeutic exercise;Therapeutic activities;Patient/family education;Joint Manipulations;Spinal Manipulations;Passive range of motion;Dry needling;Manual techniques;Taping    PT Next Visit Plan review STGs, UBE, add standing row, supine red horiz abd, yellow diagonals, DN    PT Home Exercise Plan Access Code: [email or web address]    Consulted and Agree with Plan of Care Patient           Patient will benefit from skilled therapeutic intervention in order to improve the following deficits and impairments:     Visit Diagnosis: Cervicalgia  Abnormal posture  Muscle weakness (generalized)     Problem List Patient Active Problem List   Diagnosis Date Noted  . Hypoxia 03/05/2019  . Derangement of posterior horn of medial meniscus 02/02/2019  . Derangement of posterior horn of lateral meniscus 02/02/2019  . Meniscal cyst, unspecified laterality 12/01/2018  . Torn medial meniscus 12/01/2018  . Primary osteoarthritis of left knee 12/01/2018  . Coronary artery disease involving native coronary artery of native heart without angina pectoris 09/14/2018  . Chronic diastolic (congestive) heart failure (HCC)   . Chronic pain of left knee 05/15/2018  . Mass of left knee 05/15/2018  . Non-ST elevation (NSTEMI) myocardial infarction (HCC)   . Chest  pain 01/25/2018  . RA (rheumatoid arthritis) (HCC) 01/25/2018  . Hyperlipidemia 01/25/2018  . Hypokalemia 01/25/2018  . Depression with anxiety 01/25/2018  . Tobacco abuse 01/25/2018  . Lower extremity cellulitis 01/25/2018  . Benzodiazepine dependence (HCC)   . MDD (major depressive disorder), recurrent severe, without psychosis (HCC) 10/02/2017  . CTS (carpal tunnel syndrome) 06/08/2014    Morton Peters, PT 09/03/19 12:01 PM   [facility] [address] [place], [place], [address] Phone: [phone]   Fax:  [phone]  Name: Meghan Schwartz MRN: [id number] Date of Birth: Feb 24, 1951

## 2019-09-08 ENCOUNTER — Other Ambulatory Visit: Payer: Self-pay | Admitting: Gastroenterology

## 2019-09-08 DIAGNOSIS — R1900 Intra-abdominal and pelvic swelling, mass and lump, unspecified site: Secondary | ICD-10-CM | POA: Diagnosis not present

## 2019-09-08 DIAGNOSIS — R195 Other fecal abnormalities: Secondary | ICD-10-CM | POA: Diagnosis not present

## 2019-09-10 ENCOUNTER — Telehealth: Payer: Self-pay

## 2019-09-10 NOTE — Telephone Encounter (Signed)
   [facility] Pre-operative Risk Assessment     Request for surgical clearance:  1. What type of surgery is being performed? Colonoscopy    2. When is this surgery scheduled? 10/28/19   3. What type of clearance is required (medical clearance vs. Pharmacy clearance to hold med vs. Both)? Pharmacy   4. Are there any medications that need to be held prior to surgery and how long? Plavix 5 days prior    5. Practice name and name of physician performing surgery? [facility]   6. What is the office phone number? [phone]   7.   What is the office fax number? [phone]  8.   Anesthesia type (None, local, MAC, general) ?         Propofol

## 2019-09-10 NOTE — Telephone Encounter (Signed)
   Primary Cardiologist:Paula Tenny Craw, MD  Chart reviewed as part of pre-operative protocol coverage. Patient was last seen 02/2019 by Tereso Newcomer with recommendation to f/u in 05/2019. Do not see this yet occurred. She does have an appointment coming up very soon on 09/24/19 with Dr. Tenny Craw at which time this clearance can be more appropriately reviewed, in case there are additions to her medical plan at this visit. Added mention of clearance to appointment notes. Per office protocol, the provider should assess clearance at time of office visit and should forward their finalized clearance decision to requesting party below. I will remove this message from the pre-op box. Will also cc to requesting party to make them aware.   Laurann Montana, PA-C  09/10/2019, 1:37 PM

## 2019-09-15 ENCOUNTER — Other Ambulatory Visit: Payer: Self-pay | Admitting: Gastroenterology

## 2019-09-15 ENCOUNTER — Ambulatory Visit
Admission: RE | Admit: 2019-09-15 | Discharge: 2019-09-15 | Disposition: A | Discharge status: Home / Self Care | Payer: Medicare PPO | Source: Ambulatory Visit | Attending: Gastroenterology | Admitting: Gastroenterology

## 2019-09-15 DIAGNOSIS — K8020 Calculus of gallbladder without cholecystitis without obstruction: Secondary | ICD-10-CM | POA: Diagnosis not present

## 2019-09-15 DIAGNOSIS — K7689 Other specified diseases of liver: Secondary | ICD-10-CM | POA: Diagnosis not present

## 2019-09-15 DIAGNOSIS — R1900 Intra-abdominal and pelvic swelling, mass and lump, unspecified site: Secondary | ICD-10-CM

## 2019-09-20 DIAGNOSIS — F1120 Opioid dependence, uncomplicated: Secondary | ICD-10-CM | POA: Diagnosis not present

## 2019-09-20 DIAGNOSIS — F1320 Sedative, hypnotic or anxiolytic dependence, uncomplicated: Secondary | ICD-10-CM | POA: Diagnosis not present

## 2019-09-20 DIAGNOSIS — F411 Generalized anxiety disorder: Secondary | ICD-10-CM | POA: Diagnosis not present

## 2019-09-20 DIAGNOSIS — F331 Major depressive disorder, recurrent, moderate: Secondary | ICD-10-CM | POA: Diagnosis not present

## 2019-09-21 ENCOUNTER — Other Ambulatory Visit: Payer: Self-pay

## 2019-09-21 ENCOUNTER — Encounter: Payer: Self-pay | Admitting: Physical Therapy

## 2019-09-21 ENCOUNTER — Ambulatory Visit: Payer: Medicare PPO | Admitting: Physical Therapy

## 2019-09-21 DIAGNOSIS — R293 Abnormal posture: Secondary | ICD-10-CM | POA: Diagnosis not present

## 2019-09-21 DIAGNOSIS — M542 Cervicalgia: Secondary | ICD-10-CM

## 2019-09-21 DIAGNOSIS — G8929 Other chronic pain: Secondary | ICD-10-CM | POA: Diagnosis not present

## 2019-09-21 DIAGNOSIS — M6281 Muscle weakness (generalized): Secondary | ICD-10-CM | POA: Diagnosis not present

## 2019-09-21 DIAGNOSIS — M25562 Pain in left knee: Secondary | ICD-10-CM | POA: Diagnosis not present

## 2019-09-21 NOTE — Therapy (Signed)
[facility] [address] [place], [place], [address] Phone: [phone]   Fax:  [phone]  Physical Therapy Treatment  Patient Details  Name: Meghan Schwartz MRN: [id number] Date of Birth: 10-May-1950 Referring Provider (PT): Maurice Small, MD   Encounter Date: 09/21/2019   PT End of Session - 09/21/19 1415    Visit Number 7    Date for PT Re-Evaluation 10/05/19    Authorization Type [employer]    Authorization Time Period [place] approval 12 visits 5/18-8/10    Authorization - Visit Number 7    Authorization - Number of Visits 12    Progress Note Due on Visit 10    PT Start Time 1400    PT Stop Time 1453    PT Time Calculation (min) 53 min    Activity Tolerance Patient tolerated treatment well    Behavior During Therapy [facility] for tasks assessed/performed           Past Medical History:  Diagnosis Date  . Allergic rhinitis   . Anxiety   . Benzodiazepine dependence ([place])   . Bruxism   . CAD (coronary artery disease)    S/p NSTEMI 01/2018 >> LHC: oLAD 20, pLAD 20; oD1 20, oLCx 40, pRCA 99 >> PCI: DES to prox RCA // Echo 01/2018: EF 50-55; prob inf-lat and inf HK  . Chronic diastolic heart failure    Echocardiogram 07/2018: EF 62-26, grade 1 diastolic dysfunction, normal wall motion, normal GLS (-22.3), PASP 28  . Chronic foot pain    bunions, torn ligaments-on chronic pain medication  . CTS (carpal tunnel syndrome) 06/08/2014  . Depression   . High cholesterol   . Hypertension   . MDD (major depressive disorder) 10/02/2017  . Migraine   . Myocardial infarction ([place]) 02/01/2018  . Pneumonia 1986   left lung  . RA (rheumatoid arthritis) ([place])     Past Surgical History:  Procedure Laterality Date  . BTL  2000  . CARPAL TUNNEL RELEASE Left   . [place]  . CORONARY STENT INTERVENTION N/A 01/27/2018   Procedure: CORONARY STENT INTERVENTION;  Surgeon: Burnell Blanks, MD;  Location: [place] [facility];  Service: Cardiovascular;  Laterality: N/A;  . eye implants    . FOOT SURGERY Left 2008  . front teeth removed after injury    . KNEE ARTHROSCOPY WITH LATERAL MENISECTOMY Left 02/02/2019   Procedure: LEFT KNEE ARTHROSCOPY, MEDIAL AND LATERAL MENISECTOMY, EXCISION OF LEFT LATERAL MENISCAL CYST;  Surgeon: Garald Balding, MD;  Location: [facility];  Service: Orthopedics;  Laterality: Left;  . LEFT HEART CATH AND CORONARY ANGIOGRAPHY N/A 01/27/2018   Procedure: LEFT HEART CATH AND CORONARY ANGIOGRAPHY;  Surgeon: Burnell Blanks, MD;  Location: [facility];  Service: Cardiovascular;  Laterality: N/A;  . pre cancerous lesion removed from forehead    . TOTAL ABDOMINAL HYSTERECTOMY W/ BILATERAL SALPINGOOPHORECTOMY  2002   Dr. Willis Modena  . VAGINAL HYSTERECTOMY  2001    There were no vitals filed for this visit.   Subjective Assessment - 09/21/19 1405    Subjective My HEP really helps my tension that I catch myself in.  My neck ROM is much better since last visit.  35% improved since starting PT.    Pertinent History RA, foot deformity with rheumatoid drift, LE edema    How long can you sit comfortably? 10 min    Diagnostic tests MRI 2020: Mod multilevel degenerative changes throughoutc-spine,  most prominent at C6-7. Mod to severe bilfacet arthropathy. Mild degenerative foraminal stenosis on the right at C3-4 and C4-5.    Patient Stated Goals better neck mobility, less pain with daily tasks, improved sleep    Currently in Pain? Yes    Pain Score 4     Pain Location Neck    Pain Orientation Right;Left    Pain Descriptors / Indicators Sharp;Tightness    Pain Onset More than a month ago    Pain Frequency Intermittent    Aggravating Factors  stress, driving, turning head    Pain Relieving Factors heat, HEP, laying down    Effect of Pain on Daily Activities driving              [facility] PT Assessment - 09/21/19 0001      Observation/Other Assessments   Focus on  Therapeutic Outcomes (FOTO)  48% down from 68%      AROM   AROM Assessment Site Cervical    Cervical Flexion 50    Cervical Extension 50    Cervical - Right Rotation 50    Cervical - Left Rotation 50                         [facility] Adult PT Treatment/Exercise - 09/21/19 0001      Neck Exercises: Machines for Strengthening   UBE (Upper Arm Bike) L1.5 2x2 fwd/bwd PT present to discuss progress      Neck Exercises: Theraband   Rows 15 reps    Rows Limitations red, high to low    Horizontal ABduction 10 reps    Horizontal ABduction Limitations diagonals x 10 reps, yellow      Neck Exercises: Supine   Neck Retraction 5 reps;5 secs    Cervical Rotation Both;5 reps    Cervical Rotation Limitations in retraction      Moist Heat Therapy   Number Minutes Moist Heat 10 Minutes    Moist Heat Location Cervical            Trigger Point Dry Needling - 09/21/19 0001    Consent Given? Yes    Education Handout Provided Previously provided    Muscles Treated Head and Neck Upper trapezius    Other Dry Needling bil    Upper Trapezius Response Twitch reponse elicited;Palpable increased muscle length                  PT Short Term Goals - 09/21/19 1423      PT SHORT TERM GOAL #1   Title Pt will report improved neck pain with daily tasks and sleep by at least 20%    Status Achieved      PT SHORT TERM GOAL #2   Title Pt will achieve painfree bil neck rotation of at least 45 deg to improve driving visibility.    Status Achieved      PT SHORT TERM GOAL #3   Title Pt will be ind with initial HEP and mindful of daily posture.    Status Achieved             PT Long Term Goals - 09/21/19 1423      PT LONG TERM GOAL #1   Title Pt will understand how to use tools of HEP for chronic neck pain management    Status Achieved      PT LONG TERM GOAL #2   Title Pt will demo improved neck flexion to 50 deg, ext 50  deg, bil rotation >/= 55 deg for improved functional  range for driving and household tasks.    Baseline met for flexion/ext, 50 deg bil rotation    Status On-going      PT LONG TERM GOAL #3   Title Pt will report at least 50% improvement in neck pain with daily tasks, sitting activites up to 20+ min, and sleep.    Baseline 35% improved    Status On-going      PT LONG TERM GOAL #4   Title Reduced FOTO to </= 53% limitation    Baseline 48%    Status Achieved      PT LONG TERM GOAL #5   Title Pt will achieve at least 4+/5 cervical and UE strength for improved postural support and bone density management.    Status On-going                 Plan - 09/21/19 1444    Clinical Impression Statement Pt making excellent progress toward goals.  Met FOTO goal today down to 48% from 68%.  Met goal for 50 deg flex/ext cervical ROM and within 5 deg of bil rotation from LTG.  Pt with signif twitch and release of bil upper traps today.  She carries stress and tension here but is using HEP successfully to reduce and manage better.  she reports 35% reduction in pain and complete resolution of sleep trouble due to pain.  PT notes improving scapular and neck strength today.  PT to continue to focus on strength and ROM toward remaining deficits in LTGs.    Comorbidities RA, osteoporosis, CHF    Rehab Potential Good    PT Frequency 2x / week    PT Duration 12 weeks    PT Treatment/Interventions ADLs/Self Care Home Management;Cryotherapy;Electrical Stimulation;Moist Heat;Traction;Neuromuscular re-education;Therapeutic exercise;Therapeutic activities;Patient/family education;Joint Manipulations;Spinal Manipulations;Passive range of motion;Dry needling;Manual techniques;Taping    PT Next Visit Plan cont neck and scap strength, manual/DN cervical, modalities    PT Home Exercise Plan Access Code: [email or web address]    Consulted and Agree with Plan of Care Patient           Patient will benefit from skilled therapeutic intervention in order to improve the following  deficits and impairments:     Visit Diagnosis: Cervicalgia  Abnormal posture  Muscle weakness (generalized)     Problem List Patient Active Problem List   Diagnosis Date Noted  . Hypoxia 03/05/2019  . Derangement of posterior horn of medial meniscus 02/02/2019  . Derangement of posterior horn of lateral meniscus 02/02/2019  . Meniscal cyst, unspecified laterality 12/01/2018  . Torn medial meniscus 12/01/2018  . Primary osteoarthritis of left knee 12/01/2018  . Coronary artery disease involving native coronary artery of native heart without angina pectoris 09/14/2018  . Chronic diastolic (congestive) heart failure ([place])   . Chronic pain of left knee 05/15/2018  . Mass of left knee 05/15/2018  . Non-ST elevation (NSTEMI) myocardial infarction ([place])   . Chest pain 01/25/2018  . RA (rheumatoid arthritis) ([place]) 01/25/2018  . Hyperlipidemia 01/25/2018  . Hypokalemia 01/25/2018  . Depression with anxiety 01/25/2018  . Tobacco abuse 01/25/2018  . Lower extremity cellulitis 01/25/2018  . Benzodiazepine dependence ([place])   . MDD (major depressive disorder), recurrent severe, without psychosis ([place]) 10/02/2017  . CTS (carpal tunnel syndrome) 06/08/2014    Baruch Merl, PT 09/21/19 2:47 PM   [facility] [address] [place], [place], [address] Phone: [phone]   Fax:  [phone]  Name: RENELLA STEIG MRN: [id number] Date of Birth: 1950/11/03

## 2019-09-24 ENCOUNTER — Ambulatory Visit: Payer: Medicare PPO | Admitting: Internal Medicine

## 2019-09-24 ENCOUNTER — Encounter: Payer: Self-pay | Admitting: Internal Medicine

## 2019-09-24 ENCOUNTER — Ambulatory Visit: Payer: Medicare PPO | Admitting: Physical Therapy

## 2019-09-24 ENCOUNTER — Encounter: Payer: Self-pay | Admitting: Physical Therapy

## 2019-09-24 ENCOUNTER — Other Ambulatory Visit: Payer: Self-pay

## 2019-09-24 VITALS — BP 118/60 | HR 76 | Ht 65.0 in | Wt 156.0 lb

## 2019-09-24 DIAGNOSIS — G8929 Other chronic pain: Secondary | ICD-10-CM | POA: Diagnosis not present

## 2019-09-24 DIAGNOSIS — M25562 Pain in left knee: Secondary | ICD-10-CM | POA: Diagnosis not present

## 2019-09-24 DIAGNOSIS — I2510 Atherosclerotic heart disease of native coronary artery without angina pectoris: Secondary | ICD-10-CM | POA: Diagnosis not present

## 2019-09-24 DIAGNOSIS — M6281 Muscle weakness (generalized): Secondary | ICD-10-CM

## 2019-09-24 DIAGNOSIS — M542 Cervicalgia: Secondary | ICD-10-CM

## 2019-09-24 DIAGNOSIS — R293 Abnormal posture: Secondary | ICD-10-CM | POA: Diagnosis not present

## 2019-09-24 NOTE — Progress Notes (Signed)
Cardiology Office Note   Date:  09/24/2019   ID:  Meghan Schwartz, DOB [birth year], MRN [id number]  PCP:  Shirlean Mylar, MD  Cardiologist:   Dietrich Pates, MD   F/U of CAD     History of Present Illness:  Meghan Schwartz is a 69 y.o. female with a hx of CAD   She is s/p NSTEMI in 01/2018   Underwent DES to RCA (99 to ) 20% LAD  40% LCx   Also has chronic diastolic CHF, RA and HL   Echo had been done that showed normal LVEF    SHe complained of fatigue, falling asleep easily   Still had SOB at times  The pt was last seen in cardiology by Wende Mott in Jan 2021     Since seen the pt denies CP    Breathing is stable   She uses oxygen at night.  She notes occasional prickling under skin arm,legs, face  Last about 30 sec. Does some walking   No problems    Pt is being evaluated for colonoscopy  Current Meds  Medication Sig  . acetaminophen (TYLENOL) 500 MG tablet Take 500 mg by mouth every 6 (six) hours as needed (For knee pain).  . cholecalciferol (VITAMIN D3) 25 MCG (1000 UT) tablet Take 1,000 Units by mouth daily.  . clonazePAM (KLONOPIN) 1 MG tablet Take 0.5-1 mg by mouth See admin instructions. 0.5 mg midday, 1 mg at bedtime  . clopidogrel (PLAVIX) 75 MG tablet Take 75 mg by mouth daily.  Marland Kitchen escitalopram (LEXAPRO) 10 MG tablet Take 10 mg by mouth daily.   Marland Kitchen estradiol (VIVELLE-DOT) 0.075 MG/24HR Place 1 patch onto the skin 2 (two) times a week. Tuesday and Friday  . fexofenadine (ALLEGRA) 180 MG tablet Take 180 mg by mouth daily.  . hydroxychloroquine (PLAQUENIL) 200 MG tablet Take 200 mg by mouth 2 (two) times daily.   . hydrOXYzine (ATARAX/VISTARIL) 25 MG tablet Take 1 tablet (25 mg total) by mouth every 6 (six) hours as needed for anxiety.  . metoprolol tartrate (LOPRESSOR) 25 MG tablet Take 1 tablet (25 mg total) by mouth daily. (Patient taking differently: Take 25 mg by mouth 2 (two) times daily. )  . Multiple Vitamins-Minerals (MULTIVITAMIN WITH MINERALS) tablet Take 1 tablet by mouth  daily.  . nitroGLYCERIN (NITROSTAT) 0.4 MG SL tablet Place 1 tablet (0.4 mg total) under the tongue every 5 (five) minutes as needed for chest pain.  . potassium chloride SA (KLOR-CON) 20 MEQ tablet Take 1 tablet (20 mEq total) by mouth daily.  . rosuvastatin (CRESTOR) 20 MG tablet TAKE 1 TABLET(20 MG) BY MOUTH DAILY (Patient taking differently: Take 20 mg by mouth daily. )  . ZUBSOLV 5.7-1.4 MG SUBL Place 1 tablet under the tongue 3 (three) times daily.     Allergies:   Erythromycin   Past Medical History:  Diagnosis Date  . Allergic rhinitis   . Anxiety   . Benzodiazepine dependence ([facility])   . Bruxism   . CAD (coronary artery disease)    S/p NSTEMI 01/2018 >> LHC: oLAD 20, pLAD 20; oD1 20, oLCx 40, pRCA 99 >> PCI: DES to prox RCA // Echo 01/2018: EF 50-55; prob inf-lat and inf HK  . Chronic diastolic heart failure    Echocardiogram 07/2018: EF 55-60, grade 1 diastolic dysfunction, normal wall motion, normal GLS (-22.3), PASP 28  . Chronic foot pain    bunions, torn ligaments-on chronic pain medication  . CTS (carpal tunnel syndrome)  06/08/2014  . Depression   . High cholesterol   . Hypertension   . MDD (major depressive disorder) 10/02/2017  . Migraine   . Myocardial infarction ([facility]) 02/01/2018  . Pneumonia 1986   left lung  . RA (rheumatoid arthritis) ([facility])     Past Surgical History:  Procedure Laterality Date  . BTL  2000  . CARPAL TUNNEL RELEASE Left   . CESAREAN SECTION  1978. 1983  . CORONARY STENT INTERVENTION N/A 01/27/2018   Procedure: CORONARY STENT INTERVENTION;  Surgeon: Kathleene Hazel, MD;  [facility]: [facility];  Service: Cardiovascular;  Laterality: N/A;  . eye implants    . FOOT SURGERY Left 2008  . front teeth removed after injury    . KNEE ARTHROSCOPY WITH LATERAL MENISECTOMY Left 02/02/2019   Procedure: LEFT KNEE ARTHROSCOPY, MEDIAL AND LATERAL MENISECTOMY, EXCISION OF LEFT LATERAL MENISCAL CYST;  Surgeon: Valeria Batman, MD;  Location:  [facility];  Service: Orthopedics;  Laterality: Left;  . LEFT HEART CATH AND CORONARY ANGIOGRAPHY N/A 01/27/2018   Procedure: LEFT HEART CATH AND CORONARY ANGIOGRAPHY;  Surgeon: Kathleene Hazel, MD;  Location: [facility];  Service: Cardiovascular;  Laterality: N/A;  . pre cancerous lesion removed from forehead    . TOTAL ABDOMINAL HYSTERECTOMY W/ BILATERAL SALPINGOOPHORECTOMY  2002   Dr. Jackelyn Knife  . VAGINAL HYSTERECTOMY  2001     Social History:  The patient  reports that she quit smoking about 9 years ago. Her smoking use included cigarettes. She quit after 20.00 years of use. She has never used smokeless tobacco. She reports that she does not drink alcohol and does not use drugs.   Family History:  The patient's family history includes Arthritis in her maternal grandmother; Breast cancer in her mother; Heart attack in her maternal grandmother and paternal grandfather; Parkinson's disease in her father; Raynaud syndrome in her maternal aunt; Stroke in her paternal grandmother; Thyroid disease in her mother.    ROS:  Please see the history of present illness. All other systems are reviewed and  Negative to the above problem except as noted.    PHYSICAL EXAM: VS:  BP (!) 118/60   Pulse 76   Ht 5\' 5"  (1.651 m)   Wt 156 lb (70.8 kg)   SpO2 93%   BMI 25.96 kg/m   GEN: Well nourished, well developed, in no acute distress  HEENT: normal  Neck: no JVD,  Cardiac: RRR; no murmurs;    Tr  edema  Respiratory:  clear to auscultation bilaterally, normal work of breathing GI: soft, nontender, nondistended, + BS  No hepatomegaly  MS: no deformity Moving all extremities   Skin: warm and dry, no rash Neuro:  Strength and sensation are intact Psych: euthymic mood, full affect   EKG:  EKG is not  ordered today.  Echo:  Jan 2021  Left ventricular ejection fraction, by visual estimation, is 60 to 65%. The left ventricle has normal function. There is no left ventricular  hypertrophy. 2. The left ventricle has no regional wall motion abnormalities. 3. Global right ventricle has normal systolic function.The right ventricular size is normal. No increase in right ventricular wall thickness. 4. Left atrial size was normal. 5. Right atrial size was normal. 6. The mitral valve is normal in structure. Trivial mitral valve regurgitation. No evidence of mitral stenosis. 7. The tricuspid valve is normal in structure. 8. The aortic valve is tricuspid. Aortic valve regurgitation is not visualized. Mild aortic valve sclerosis without  stenosis. 9. The pulmonic valve was not well visualized. Pulmonic valve regurgitation is not visualized. 10. The inferior vena cava is normal in size with greater than 50% respiratory variability, suggesting right atrial pressure of 3 mmHg. 11. Normal LV function.  Lipid Panel    Component Value Date/Time   CHOL 122 08/11/2018 1135   TRIG 71 08/11/2018 1135   HDL 62 08/11/2018 1135   CHOLHDL 2.0 08/11/2018 1135   CHOLHDL 2.7 01/28/2018 0353   VLDL 16 01/28/2018 0353   LDLCALC 46 08/11/2018 1135      Wt Readings from Last 3 Encounters:  09/24/19 156 lb (70.8 kg)  04/06/19 153 lb (69.4 kg)  03/12/19 153 lb 12.8 oz (69.8 kg)      ASSESSMENT AND PLAN:  1  Acute on chronic diastolic CHF Volume status is not bad  Watch sodium     2 CAD No symptoms of angina     3  HL  LDL in April 2021 73  HDL 63   COntinue statin  4  "Prickly sensation"   Atypical  Not sure what represents    5   Colonoscopy  OK to proceed   Can hold plavix  F/U in clinic in 8 months      Current medicines are reviewed at length with the patient today.  The patient does not have concerns regarding medicines.  Signed, Dietrich Pates, MD  09/24/2019 3:59 PM    [facility] [address] [place], [place], [place]  [address] Phone: [phone]; Fax: [phone]

## 2019-09-24 NOTE — Patient Instructions (Addendum)
Medication Instructions:  No changes *If you need a refill on your cardiac medications before your next appointment, please call your pharmacy*   Lab Work: none If you have labs (blood work) drawn today and your tests are completely normal, you will receive your results only by: . MyChart Message (if you have MyChart) OR . A paper copy in the mail If you have any lab test that is abnormal or we need to change your treatment, we will call you to review the results.   Testing/Procedures: none   Follow-Up: At [facility], you and your health needs are our priority.  As part of our continuing mission to provide you with exceptional heart care, we have created designated Provider Care Teams.  These Care Teams include your primary Cardiologist (physician) and Advanced Practice Providers (APPs -  Physician Assistants and Nurse Practitioners) who all work together to provide you with the care you need, when you need it.   Your next appointment:   8 month(s)  The format for your next appointment:   In Person  Provider:   You may see Paula Ross, MD or one of the following Advanced Practice Providers on your designated Care Team:    Scott Weaver, PA-C  Vin Bhagat, PA-C   Other Instructions   

## 2019-09-24 NOTE — Therapy (Signed)
[facility] [address] [place], [place], [address] Phone: [phone]   Fax:  [phone]  Physical Therapy Treatment  Patient Details  Name: Meghan Schwartz MRN: [id number] Date of Birth: 05-Dec-1950 Referring Provider (PT): Maurice Small, MD   Encounter Date: 09/24/2019   PT End of Session - 09/24/19 1025    Visit Number 8    Date for PT Re-Evaluation 10/05/19    Authorization Type [employer]    Authorization Time Period Cohere approval 12 visits 5/18-8/10    Authorization - Visit Number 8    Authorization - Number of Visits 12    PT Start Time 1020    PT Stop Time 1105    PT Time Calculation (min) 45 min    Activity Tolerance Patient tolerated treatment well    Behavior During Therapy [facility] for tasks assessed/performed           Past Medical History:  Diagnosis Date  . Allergic rhinitis   . Anxiety   . Benzodiazepine dependence ([place])   . Bruxism   . CAD (coronary artery disease)    S/p NSTEMI 01/2018 >> LHC: oLAD 20, pLAD 20; oD1 20, oLCx 40, pRCA 99 >> PCI: DES to prox RCA // Echo 01/2018: EF 50-55; prob inf-lat and inf HK  . Chronic diastolic heart failure    Echocardiogram 07/2018: EF 66-06, grade 1 diastolic dysfunction, normal wall motion, normal GLS (-22.3), PASP 28  . Chronic foot pain    bunions, torn ligaments-on chronic pain medication  . CTS (carpal tunnel syndrome) 06/08/2014  . Depression   . High cholesterol   . Hypertension   . MDD (major depressive disorder) 10/02/2017  . Migraine   . Myocardial infarction ([place]) 02/01/2018  . Pneumonia 1986   left lung  . RA (rheumatoid arthritis) ([place])     Past Surgical History:  Procedure Laterality Date  . BTL  2000  . CARPAL TUNNEL RELEASE Left   . [place]  . CORONARY STENT INTERVENTION N/A 01/27/2018   Procedure: CORONARY STENT INTERVENTION;  Surgeon: Burnell Blanks, MD;  Location: [place] [facility];  Service:  Cardiovascular;  Laterality: N/A;  . eye implants    . FOOT SURGERY Left 2008  . front teeth removed after injury    . KNEE ARTHROSCOPY WITH LATERAL MENISECTOMY Left 02/02/2019   Procedure: LEFT KNEE ARTHROSCOPY, MEDIAL AND LATERAL MENISECTOMY, EXCISION OF LEFT LATERAL MENISCAL CYST;  Surgeon: Garald Balding, MD;  Location: [facility];  Service: Orthopedics;  Laterality: Left;  . LEFT HEART CATH AND CORONARY ANGIOGRAPHY N/A 01/27/2018   Procedure: LEFT HEART CATH AND CORONARY ANGIOGRAPHY;  Surgeon: Burnell Blanks, MD;  Location: [place] [facility];  Service: Cardiovascular;  Laterality: N/A;  . pre cancerous lesion removed from forehead    . TOTAL ABDOMINAL HYSTERECTOMY W/ BILATERAL SALPINGOOPHORECTOMY  2002   Dr. Willis Modena  . VAGINAL HYSTERECTOMY  2001    There were no vitals filed for this visit.   Subjective Assessment - 09/24/19 1023    Subjective My neck was feeling much better until life threw a curve ball of big stress yesterday and now my pain is back up.  Stress makes me worse.  Massage, DN, TENS have all helped.    Pertinent History RA, foot deformity with rheumatoid drift, LE edema    Limitations House hold activities;Lifting;Sitting    How long can you sit comfortably? 10 min    Diagnostic tests MRI  2020: Mod multilevel degenerative changes throughoutc-spine, most prominent at C6-7. Mod to severe bilfacet arthropathy. Mild degenerative foraminal stenosis on the right at C3-4 and C4-5.    Patient Stated Goals better neck mobility, less pain with daily tasks, improved sleep    Currently in Pain? Yes    Pain Score 5     Pain Location Neck    Pain Orientation Right;Left    Pain Descriptors / Indicators Tightness;Sore;Sharp    Pain Type Chronic pain    Pain Onset More than a month ago    Pain Frequency Intermittent    Aggravating Factors  stress, driving, turning head    Pain Relieving Factors heat, HEP, laying down, TENS    Effect of Pain on Daily Activities driving                              [place] Adult PT Treatment/Exercise - 09/24/19 0001      Neck Exercises: Machines for Strengthening   UBE (Upper Arm Bike) L1.5 2x2 fwd/bwd PT present to discuss progress      Neck Exercises: Seated   Cervical Rotation Right;Left;10 reps    Shoulder Rolls Backwards;10 reps    Other Seated Exercise neck flexion x 10 reps    Other Seated Exercise scap squeezes x 10      Moist Heat Therapy   Number Minutes Moist Heat 10 Minutes    Moist Heat Location Cervical      Electrical Stimulation   Electrical Stimulation Location neck    Electrical Stimulation Action IFC    Electrical Stimulation Goals Pain      Manual Therapy   Manual Therapy Soft tissue mobilization    Soft tissue mobilization in bil SL: upper traps, cervical paraspinals, scalenes, levator scapulae                    PT Short Term Goals - 09/21/19 1423      PT SHORT TERM GOAL #1   Title Pt will report improved neck pain with daily tasks and sleep by at least 20%    Status Achieved      PT SHORT TERM GOAL #2   Title Pt will achieve painfree bil neck rotation of at least 45 deg to improve driving visibility.    Status Achieved      PT SHORT TERM GOAL #3   Title Pt will be ind with initial HEP and mindful of daily posture.    Status Achieved             PT Long Term Goals - 09/21/19 1423      PT LONG TERM GOAL #1   Title Pt will understand how to use tools of HEP for chronic neck pain management    Status Achieved      PT LONG TERM GOAL #2   Title Pt will demo improved neck flexion to 50 deg, ext 50 deg, bil rotation >/= 55 deg for improved functional range for driving and household tasks.    Baseline met for flexion/ext, 50 deg bil rotation    Status On-going      PT LONG TERM GOAL #3   Title Pt will report at least 50% improvement in neck pain with daily tasks, sitting activites up to 20+ min, and sleep.    Baseline 35% improved    Status On-going       PT LONG TERM GOAL #4   Title  Reduced FOTO to </= 53% limitation    Baseline 48%    Status Achieved      PT LONG TERM GOAL #5   Title Pt will achieve at least 4+/5 cervical and UE strength for improved postural support and bone density management.    Status On-going                 Plan - 09/24/19 1102    Clinical Impression Statement Pt reports overall she is much improved for neck range and pain but stress exacerbates her pain.  Yesterday was very stressful and she arrived with 5/10 pain.  PT reviewed gentle mobility ther ex and perormed STM to bil upper quadrants and cervical region. Modalities have helped in past so PT used IFC/heat combo end of session to reduce tension and pain related to stress.  Pt will continue to benefit from skilled PT along POC.    Comorbidities RA, osteoporosis, CHF    PT Frequency 2x / week    PT Duration 12 weeks    PT Treatment/Interventions ADLs/Self Care Home Management;Cryotherapy;Electrical Stimulation;Moist Heat;Traction;Neuromuscular re-education;Therapeutic exercise;Therapeutic activities;Patient/family education;Joint Manipulations;Spinal Manipulations;Passive range of motion;Dry needling;Manual techniques;Taping    PT Next Visit Plan DN, tband strength, neck ROM    PT Home Exercise Plan Access Code: [email or web address]    Consulted and Agree with Plan of Care Patient           Patient will benefit from skilled therapeutic intervention in order to improve the following deficits and impairments:     Visit Diagnosis: Cervicalgia  Abnormal posture  Muscle weakness (generalized)  Chronic pain of left knee     Problem List Patient Active Problem List   Diagnosis Date Noted  . Hypoxia 03/05/2019  . Derangement of posterior horn of medial meniscus 02/02/2019  . Derangement of posterior horn of lateral meniscus 02/02/2019  . Meniscal cyst, unspecified laterality 12/01/2018  . Torn medial meniscus 12/01/2018  . Primary osteoarthritis of  left knee 12/01/2018  . Coronary artery disease involving native coronary artery of native heart without angina pectoris 09/14/2018  . Chronic diastolic (congestive) heart failure ([place])   . Chronic pain of left knee 05/15/2018  . Mass of left knee 05/15/2018  . Non-ST elevation (NSTEMI) myocardial infarction ([place])   . Chest pain 01/25/2018  . RA (rheumatoid arthritis) ([place]) 01/25/2018  . Hyperlipidemia 01/25/2018  . Hypokalemia 01/25/2018  . Depression with anxiety 01/25/2018  . Tobacco abuse 01/25/2018  . Lower extremity cellulitis 01/25/2018  . Benzodiazepine dependence ([place])   . MDD (major depressive disorder), recurrent severe, without psychosis ([place]) 10/02/2017  . CTS (carpal tunnel syndrome) 06/08/2014   Baruch Merl, PT 09/24/19 11:04 AM   [facility] [address], [place], [place], [address] Phone: [phone]   Fax:  [phone]  Name: Meghan Schwartz MRN: [id number] Date of Birth: [birth year]

## 2019-09-28 ENCOUNTER — Encounter: Payer: Self-pay | Admitting: Physical Therapy

## 2019-09-28 ENCOUNTER — Ambulatory Visit: Payer: Medicare PPO | Attending: Family Medicine | Admitting: Physical Therapy

## 2019-09-28 ENCOUNTER — Telehealth: Payer: Self-pay | Admitting: *Deleted

## 2019-09-28 ENCOUNTER — Other Ambulatory Visit: Payer: Self-pay

## 2019-09-28 DIAGNOSIS — R293 Abnormal posture: Secondary | ICD-10-CM | POA: Insufficient documentation

## 2019-09-28 DIAGNOSIS — M6281 Muscle weakness (generalized): Secondary | ICD-10-CM | POA: Insufficient documentation

## 2019-09-28 DIAGNOSIS — M542 Cervicalgia: Secondary | ICD-10-CM | POA: Diagnosis not present

## 2019-09-28 NOTE — Telephone Encounter (Signed)
Patient confirmed she is not taking aspirin.  Adv I would call her back if Dr. Tenny Craw wants her to restart.  This is from [facility] visit note Mar 12, 2019:  2. Coronary artery disease involving native coronary artery of native heart without angina pectoris Hx of NSTEMI in 01/2018 tx with DES to RCA.  She was recently admitted with chest pain in the setting of a/c diastolic CHF.  Her hs-Troponins were normal.  She has not had further chest pain.  No further testing is needed.  She is > 1 year out from her MI and no longer needs DAPT.             -DC ASA             -Continue Clopidogrel, Metoprolol, Rosuvastatin

## 2019-09-28 NOTE — Therapy (Signed)
[facility] [address] [place], [place], [address] Phone: [phone]   Fax:  [phone]  Physical Therapy Treatment  Patient Details  Name: ANALISSA BAYLESS MRN: [id number] Date of Birth: 1950-06-24 Referring Provider (PT): Maurice Small, MD   Encounter Date: 09/28/2019   PT End of Session - 09/28/19 1358    Visit Number 9    Date for PT Re-Evaluation 10/05/19    Authorization Type [employer]    Authorization Time Period Cohere approval 12 visits 5/18-8/10    Authorization - Visit Number 9    Authorization - Number of Visits 12    Progress Note Due on Visit 10    PT Start Time 1400    PT Stop Time 1447    PT Time Calculation (min) 47 min    Activity Tolerance Patient tolerated treatment well    Behavior During Therapy [facility] for tasks assessed/performed           Past Medical History:  Diagnosis Date  . Allergic rhinitis   . Anxiety   . Benzodiazepine dependence ([place])   . Bruxism   . CAD (coronary artery disease)    S/p NSTEMI 01/2018 >> LHC: oLAD 20, pLAD 20; oD1 20, oLCx 40, pRCA 99 >> PCI: DES to prox RCA // Echo 01/2018: EF 50-55; prob inf-lat and inf HK  . Chronic diastolic heart failure    Echocardiogram 07/2018: EF 19-50, grade 1 diastolic dysfunction, normal wall motion, normal GLS (-22.3), PASP 28  . Chronic foot pain    bunions, torn ligaments-on chronic pain medication  . CTS (carpal tunnel syndrome) 06/08/2014  . Depression   . High cholesterol   . Hypertension   . MDD (major depressive disorder) 10/02/2017  . Migraine   . Myocardial infarction ([place]) 02/01/2018  . Pneumonia 1986   left lung  . RA (rheumatoid arthritis) ([place])     Past Surgical History:  Procedure Laterality Date  . BTL  2000  . CARPAL TUNNEL RELEASE Left   . [place]  . CORONARY STENT INTERVENTION N/A 01/27/2018   Procedure: CORONARY STENT INTERVENTION;  Surgeon: Burnell Blanks, MD;  Location: [place] [facility];  Service: Cardiovascular;  Laterality: N/A;  . eye implants    . FOOT SURGERY Left 2008  . front teeth removed after injury    . KNEE ARTHROSCOPY WITH LATERAL MENISECTOMY Left 02/02/2019   Procedure: LEFT KNEE ARTHROSCOPY, MEDIAL AND LATERAL MENISECTOMY, EXCISION OF LEFT LATERAL MENISCAL CYST;  Surgeon: Garald Balding, MD;  [facility];  Service: Orthopedics;  Laterality: Left;  . LEFT HEART CATH AND CORONARY ANGIOGRAPHY N/A 01/27/2018   Procedure: LEFT HEART CATH AND CORONARY ANGIOGRAPHY;  Surgeon: Burnell Blanks, MD;  Location: [place] [facility];  Service: Cardiovascular;  Laterality: N/A;  . pre cancerous lesion removed from forehead    . TOTAL ABDOMINAL HYSTERECTOMY W/ BILATERAL SALPINGOOPHORECTOMY  2002   Dr. Willis Modena  . VAGINAL HYSTERECTOMY  2001    There were no vitals filed for this visit.   Subjective Assessment - 09/28/19 1359    Subjective Less stress today so less pain.  I am sleeping well and don't get neck cramps.  My range of motion continues to be better with driving.    Pertinent History RA, foot deformity with rheumatoid drift, LE edema    How long can you sit comfortably? 10 min    Diagnostic tests MRI 2020: Mod multilevel degenerative changes throughoutc-spine,  most prominent at C6-7. Mod to severe bilfacet arthropathy. Mild degenerative foraminal stenosis on the right at C3-4 and C4-5.    Patient Stated Goals better neck mobility, less pain with daily tasks, improved sleep    Currently in Pain? Yes    Pain Score 2     Pain Location Neck    Pain Orientation Right;Left    Pain Descriptors / Indicators Tightness;Sore;Sharp    Pain Type Chronic pain    Pain Onset More than a month ago    Pain Frequency Intermittent    Aggravating Factors  stress    Pain Relieving Factors HEP, heat, DN, laying down                             [facility] Adult PT Treatment/Exercise - 09/28/19 0001      Exercises   Exercises Neck       Neck Exercises: Machines for Strengthening   UBE (Upper Arm Bike) L1.5 2x2, PT present to discuss plan before d/c in 2 visits      Moist Heat Therapy   Number Minutes Moist Heat 10 Minutes    Moist Heat Location Cervical      Electrical Stimulation   Electrical Stimulation Location neck    Electrical Stimulation Action IFC    Electrical Stimulation Goals Pain      Manual Therapy   Manual Therapy Soft tissue mobilization    Soft tissue mobilization prone: bil upper traps, upper thoracic spine and soft tissues            Trigger Point Dry Needling - 09/28/19 0001    Consent Given? Yes    Education Handout Provided Previously provided    Muscles Treated Head and Neck Upper trapezius;Cervical multifidi    Dry Needling Comments Rt multifidus, bil upper traps    Upper Trapezius Response Twitch reponse elicited;Palpable increased muscle length    Cervical multifidi Response Twitch reponse elicited;Palpable increased muscle length                  PT Short Term Goals - 09/21/19 1423      PT SHORT TERM GOAL #1   Title Pt will report improved neck pain with daily tasks and sleep by at least 20%    Status Achieved      PT SHORT TERM GOAL #2   Title Pt will achieve painfree bil neck rotation of at least 45 deg to improve driving visibility.    Status Achieved      PT SHORT TERM GOAL #3   Title Pt will be ind with initial HEP and mindful of daily posture.    Status Achieved             PT Long Term Goals - 09/21/19 1423      PT LONG TERM GOAL #1   Title Pt will understand how to use tools of HEP for chronic neck pain management    Status Achieved      PT LONG TERM GOAL #2   Title Pt will demo improved neck flexion to 50 deg, ext 50 deg, bil rotation >/= 55 deg for improved functional range for driving and household tasks.    Baseline met for flexion/ext, 50 deg bil rotation    Status On-going      PT LONG TERM GOAL #3   Title Pt will report at least 50%  improvement in neck pain with daily tasks, sitting activites up  to 20+ min, and sleep.    Baseline 35% improved    Status On-going      PT LONG TERM GOAL #4   Title Reduced FOTO to </= 53% limitation    Baseline 48%    Status Achieved      PT LONG TERM GOAL #5   Title Pt will achieve at least 4+/5 cervical and UE strength for improved postural support and bone density management.    Status On-going                 Plan - 09/28/19 1439    Clinical Impression Statement Pt continues to make gains with PT.  She understands her tools for stress relief and ROM/stretching to keep her pain down in presence of chronic neck pain related to diffuse signif arthritis.  Sleep, driving, and headaches are much improved since starting PT.  She had signif twitch/release of bil upper traps and C6/7 multifidus on Rt today. She benefits from modalities end of session (TENS/heat).  PT recommends one more visit with plan to d/c next visit to HEP.    Comorbidities RA, osteoporosis, CHF    PT Frequency 2x / week    PT Duration 12 weeks    PT Treatment/Interventions ADLs/Self Care Home Management;Cryotherapy;Electrical Stimulation;Moist Heat;Traction;Neuromuscular re-education;Therapeutic exercise;Therapeutic activities;Patient/family education;Joint Manipulations;Spinal Manipulations;Passive range of motion;Dry needling;Manual techniques;Taping    PT Next Visit Plan DN, modalities, finalize HEP, d/c    PT Home Exercise Plan Access Code: [email or web address]    [id number] and Agree with Plan of Care Patient           Patient will benefit from skilled therapeutic intervention in order to improve the following deficits and impairments:     Visit Diagnosis: Cervicalgia  Abnormal posture  Muscle weakness (generalized)     Problem List Patient Active Problem List   Diagnosis Date Noted  . Hypoxia 03/05/2019  . Derangement of posterior horn of medial meniscus 02/02/2019  . Derangement of posterior horn of  lateral meniscus 02/02/2019  . Meniscal cyst, unspecified laterality 12/01/2018  . Torn medial meniscus 12/01/2018  . Primary osteoarthritis of left knee 12/01/2018  . Coronary artery disease involving native coronary artery of native heart without angina pectoris 09/14/2018  . Chronic diastolic (congestive) heart failure ([place])   . Chronic pain of left knee 05/15/2018  . Mass of left knee 05/15/2018  . Non-ST elevation (NSTEMI) myocardial infarction ([place])   . Chest pain 01/25/2018  . RA (rheumatoid arthritis) ([place]) 01/25/2018  . Hyperlipidemia 01/25/2018  . Hypokalemia 01/25/2018  . Depression with anxiety 01/25/2018  . Tobacco abuse 01/25/2018  . Lower extremity cellulitis 01/25/2018  . Benzodiazepine dependence ([place])   . MDD (major depressive disorder), recurrent severe, without psychosis ([place]) 10/02/2017  . CTS (carpal tunnel syndrome) 06/08/2014    Baruch Merl, PT 09/28/19 2:43 PM   [facility] [address], [place], [place], [address] Phone: [phone]   Fax:  [phone]  Name: NINETTE COTTA MRN: [id number] Date of Birth: 05-13-1950

## 2019-09-28 NOTE — Telephone Encounter (Signed)
Left message for patient to call back  

## 2019-09-28 NOTE — Telephone Encounter (Signed)
-----   Message from Pricilla Riffle, MD sent at 09/25/2019  9:34 PM EDT ----- Pt OK to hold plavix prior to colonoscopy    ? Is she on ASA?   Med list does not reflect

## 2019-09-28 NOTE — Telephone Encounter (Signed)
Patient is returning call.  °

## 2019-09-29 ENCOUNTER — Telehealth: Payer: Self-pay

## 2019-09-29 DIAGNOSIS — D480 Neoplasm of uncertain behavior of bone and articular cartilage: Secondary | ICD-10-CM | POA: Diagnosis not present

## 2019-09-29 DIAGNOSIS — D2339 Other benign neoplasm of skin of other parts of face: Secondary | ICD-10-CM | POA: Diagnosis not present

## 2019-09-29 NOTE — Telephone Encounter (Signed)
   [facility] Pre-operative Risk Assessment    HEARTCARE STAFF: - Please ensure there is not already an duplicate clearance open for this procedure. - Under Visit Info/Reason for Call, type in Other and utilize the format Clearance MM/DD/YY or Clearance TBD. Do not use dashes or single digits. - If request is for dental extraction, please clarify the # of teeth to be extracted.  Request for surgical clearance:  1. What type of surgery is being performed? Colonoscopy    2. When is this surgery scheduled?  10/28/19   3. What type of clearance is required (medical clearance vs. Pharmacy clearance to hold med vs. Both)? Both   4. Are there any medications that need to be held prior to surgery and how long? Plavix (requesting 5 day hold)  5. Practice name and name of physician performing surgery? [facility]   Dr. Therisa Doyne   6. What is the office phone number? [phone]   7.   What is the office fax number? [phone]  8.   Anesthesia type (None, local, MAC, general) ? [place] 09/29/2019, 3:12 PM  _________________________________________________________________

## 2019-09-29 NOTE — Telephone Encounter (Signed)
Patient aware ok per Dr. Tenny Craw to hold Plavix prior to colonoscopy.  Informed her to check with the doctor doing the procedure as far as for how many days he would like her to hold this.  Adv if they need to ask for permission for a certain number of days to hold Plavix, their office will reach out to Dr. Tenny Craw.

## 2019-09-29 NOTE — Telephone Encounter (Signed)
OK    Again, I think it is OK to hold Plavix

## 2019-09-30 NOTE — Telephone Encounter (Signed)
   Primary Cardiologist: Dietrich Pates, MD  Chart reviewed as part of pre-operative protocol coverage. Given past medical history and time since last visit, based on ACC/AHA guidelines, Meghan Schwartz would be at acceptable risk for the planned procedure without further cardiovascular testing.   Her Plavix may be held for 5 days prior to the procedure.  Please resume as soon as hemostasis is achieved.  I will route this recommendation to the requesting party via Epic fax function and remove from pre-op pool.  Please call with questions.  Thomasene Ripple. Annalucia Laino NP-C    09/30/2019, 8:05 AM [facility] [address] Office [phone] Fax [phone]

## 2019-10-04 DIAGNOSIS — F1320 Sedative, hypnotic or anxiolytic dependence, uncomplicated: Secondary | ICD-10-CM | POA: Diagnosis not present

## 2019-10-04 DIAGNOSIS — I5032 Chronic diastolic (congestive) heart failure: Secondary | ICD-10-CM | POA: Diagnosis not present

## 2019-10-04 DIAGNOSIS — F1120 Opioid dependence, uncomplicated: Secondary | ICD-10-CM | POA: Diagnosis not present

## 2019-10-04 DIAGNOSIS — F411 Generalized anxiety disorder: Secondary | ICD-10-CM | POA: Diagnosis not present

## 2019-10-04 DIAGNOSIS — F331 Major depressive disorder, recurrent, moderate: Secondary | ICD-10-CM | POA: Diagnosis not present

## 2019-10-05 ENCOUNTER — Encounter: Payer: Self-pay | Admitting: Physical Therapy

## 2019-10-05 ENCOUNTER — Other Ambulatory Visit: Payer: Self-pay

## 2019-10-05 ENCOUNTER — Ambulatory Visit: Payer: Medicare PPO | Admitting: Physical Therapy

## 2019-10-05 DIAGNOSIS — M542 Cervicalgia: Secondary | ICD-10-CM | POA: Diagnosis not present

## 2019-10-05 DIAGNOSIS — R293 Abnormal posture: Secondary | ICD-10-CM | POA: Diagnosis not present

## 2019-10-05 DIAGNOSIS — M6281 Muscle weakness (generalized): Secondary | ICD-10-CM

## 2019-10-05 NOTE — Therapy (Addendum)
[facility] [address] [place], [place], [address] Phone: [phone]   Fax:  [phone]  Physical Therapy Treatment  Patient Details  Name: ALLINA RICHES MRN: [id number] Date of Birth: 09-Dec-1950 Referring Provider (PT): Maurice Small, MD   Encounter Date: 10/05/2019   PT End of Session - 10/05/19 1356    Visit Number 10    Date for PT Re-Evaluation 10/05/19    Authorization Type [employer]    Authorization Time Period [place] approval 12 visits 5/18-8/10    Authorization - Visit Number 10    Authorization - Number of Visits 12    Progress Note Due on Visit 10    PT Start Time 1157    PT Stop Time 1450    PT Time Calculation (min) 52 min    Activity Tolerance Patient tolerated treatment well    Behavior During Therapy [facility] for tasks assessed/performed           Past Medical History:  Diagnosis Date  . Allergic rhinitis   . Anxiety   . Benzodiazepine dependence ([place])   . Bruxism   . CAD (coronary artery disease)    S/p NSTEMI 01/2018 >> LHC: oLAD 20, pLAD 20; oD1 20, oLCx 40, pRCA 99 >> PCI: DES to prox RCA // Echo 01/2018: EF 50-55; prob inf-lat and inf HK  . Chronic diastolic heart failure    Echocardiogram 07/2018: EF 26-20, grade 1 diastolic dysfunction, normal wall motion, normal GLS (-22.3), PASP 28  . Chronic foot pain    bunions, torn ligaments-on chronic pain medication  . CTS (carpal tunnel syndrome) 06/08/2014  . Depression   . High cholesterol   . Hypertension   . MDD (major depressive disorder) 10/02/2017  . Migraine   . Myocardial infarction ([place]) 02/01/2018  . Pneumonia 1986   left lung  . RA (rheumatoid arthritis) ([place])     Past Surgical History:  Procedure Laterality Date  . BTL  2000  . CARPAL TUNNEL RELEASE Left   . [place]  . CORONARY STENT INTERVENTION N/A 01/27/2018   Procedure: CORONARY STENT INTERVENTION;  Surgeon: Burnell Blanks, MD;  Location: [facility];  Service: Cardiovascular;  Laterality: N/A;  . eye implants    . FOOT SURGERY Left 2008  . front teeth removed after injury    . KNEE ARTHROSCOPY WITH LATERAL MENISECTOMY Left 02/02/2019   Procedure: LEFT KNEE ARTHROSCOPY, MEDIAL AND LATERAL MENISECTOMY, EXCISION OF LEFT LATERAL MENISCAL CYST;  Surgeon: Garald Balding, MD;  Location: [facility];  Service: Orthopedics;  Laterality: Left;  . LEFT HEART CATH AND CORONARY ANGIOGRAPHY N/A 01/27/2018   Procedure: LEFT HEART CATH AND CORONARY ANGIOGRAPHY;  Surgeon: Burnell Blanks, MD;  Location: [facility];  Service: Cardiovascular;  Laterality: N/A;  . pre cancerous lesion removed from forehead    . TOTAL ABDOMINAL HYSTERECTOMY W/ BILATERAL SALPINGOOPHORECTOMY  2002   Dr. Willis Modena  . VAGINAL HYSTERECTOMY  2001    There were no vitals filed for this visit.   Subjective Assessment - 10/05/19 1403    Subjective I am stressed but hanging in there.  Ready for d/c.    Pertinent History RA, foot deformity with rheumatoid drift, LE edema    Limitations House hold activities;Lifting;Sitting    How long can you sit comfortably? 10 min    Diagnostic tests MRI 2020: Mod multilevel degenerative changes throughoutc-spine, most prominent at C6-7. Mod to severe bilfacet arthropathy.  Mild degenerative foraminal stenosis on the right at C3-4 and C4-5.    Patient Stated Goals better neck mobility, less pain with daily tasks, improved sleep    Currently in Pain? Yes    Pain Score 1     Pain Location Neck    Pain Orientation Right;Left    Pain Descriptors / Indicators Tightness;Sore;Sharp    Pain Type Chronic pain    Pain Onset More than a month ago    Pain Frequency Intermittent    Aggravating Factors  stress    Pain Relieving Factors HEP, heat, DN, laying down    Effect of Pain on Daily Activities driving              [facility] PT Assessment - 10/05/19 0001      Assessment   Medical Diagnosis M47.812 (ICD-10-CM) -  Spondylosis without myelopathy or radiculopathy, cervical region    Referring Provider (PT) Maurice Small, MD    Onset Date/Surgical Date --   approx 5 years ago   Hand Dominance Right    Next MD Visit 6 mos    Prior Therapy yes for knee here at this facility      Observation/Other Assessments   Focus on Therapeutic Outcomes (FOTO)  48% down from 68%      AROM   Cervical Flexion 55 (P)     Cervical Extension 50    Cervical - Right Rotation 50    Cervical - Left Rotation 50      Strength   Overall Strength Comments cervical strength 4+/5 all planes      Palpation   Palpation comment TPs present bil upper traps                         [facility] Adult PT Treatment/Exercise - 10/05/19 0001      Exercises   Exercises Neck      Neck Exercises: Machines for Strengthening   UBE (Upper Arm Bike) L1.5 2x2 PT present to review goals and status      Modalities   Modalities Electrical Stimulation;Moist Heat      Moist Heat Therapy   Number Minutes Moist Heat 10 Minutes    Moist Heat Location Cervical      Electrical Stimulation   Electrical Stimulation Location neck    Electrical Stimulation Action IFC    Electrical Stimulation Goals Pain      Manual Therapy   Manual Therapy Soft tissue mobilization;Manual Traction    Soft tissue mobilization supine bil upper traps    Manual Traction Gr II supine mid and lower c-spine             Trigger Point Dry Needling - 10/05/19 0001    Consent Given? Yes    Education Handout Provided Previously provided    Muscles Treated Head and Neck Upper trapezius    Dry Needling Comments bil    Upper Trapezius Response Twitch reponse elicited;Palpable increased muscle length                  PT Short Term Goals - 09/21/19 1423      PT SHORT TERM GOAL #1   Title Pt will report improved neck pain with daily tasks and sleep by at least 20%    Status Achieved      PT SHORT TERM GOAL #2   Title Pt will achieve painfree bil  neck rotation of at least 45 deg to improve driving visibility.  Status Achieved      PT SHORT TERM GOAL #3   Title Pt will be ind with initial HEP and mindful of daily posture.    Status Achieved             PT Long Term Goals - 10/05/19 1357      PT LONG TERM GOAL #1   Title Pt will understand how to use tools of HEP for chronic neck pain management    Status Achieved      PT LONG TERM GOAL #2   Title Pt will demo improved neck flexion to 50 deg, ext 50 deg, bil rotation >/= 55 deg for improved functional range for driving and household tasks.    Baseline met for flexion/ext, 50 deg bil rotation    Status Partially Met      PT LONG TERM GOAL #3   Title Pt will report at least 50% improvement in neck pain with daily tasks, sitting activites up to 20+ min, and sleep.    Baseline 35% improved    Status Partially Met      PT LONG TERM GOAL #4   Title Reduced FOTO to </= 53% limitation    Baseline 48%    Status Achieved      PT LONG TERM GOAL #5   Title Pt will achieve at least 4+/5 cervical and UE strength for improved postural support and bone density management.    Status Achieved                 Plan - 10/05/19 1445    Clinical Impression Statement Pt arrived with pain of 1/10.  She has met or partially met all LTGs and is very pleased with her progress.  She gained 5 more degrees for neck flexion (55 deg) but has reached plateau of comfortable neck ROM of bil rot and ext of 50 deg.  Sleep and driving are much improved for pain and visibility (driving).  She realizes stress contributes toward her neck tension and we have discussed this link and the need for stress management tools such as meditation.  Pt has also been instructed in spinal decompression for bone health.  She has a tband and ROM HEP which she plans to use to maintain gains in PT.  She is ready for d/c to HEP at this time.    Comorbidities RA, osteoporosis, CHF    PT Frequency 2x / week    PT  Duration 12 weeks    PT Treatment/Interventions ADLs/Self Care Home Management;Cryotherapy;Electrical Stimulation;Moist Heat;Traction;Neuromuscular re-education;Therapeutic exercise;Therapeutic activities;Patient/family education;Joint Manipulations;Spinal Manipulations;Passive range of motion;Dry needling;Manual techniques;Taping    PT Next Visit Plan d/c    PT Home Exercise Plan Access Code: [email or web address]    Consulted and Agree with Plan of Care Patient           Patient will benefit from skilled therapeutic intervention in order to improve the following deficits and impairments:     Visit Diagnosis: Cervicalgia  Abnormal posture  Muscle weakness (generalized)     Problem List Patient Active Problem List   Diagnosis Date Noted  . Hypoxia 03/05/2019  . Derangement of posterior horn of medial meniscus 02/02/2019  . Derangement of posterior horn of lateral meniscus 02/02/2019  . Meniscal cyst, unspecified laterality 12/01/2018  . Torn medial meniscus 12/01/2018  . Primary osteoarthritis of left knee 12/01/2018  . Coronary artery disease involving native coronary artery of native heart without angina pectoris 09/14/2018  . Chronic diastolic (congestive) heart failure (  [place])   . Chronic pain of left knee 05/15/2018  . Mass of left knee 05/15/2018  . Non-ST elevation (NSTEMI) myocardial infarction ([place])   . Chest pain 01/25/2018  . RA (rheumatoid arthritis) ([place]) 01/25/2018  . Hyperlipidemia 01/25/2018  . Hypokalemia 01/25/2018  . Depression with anxiety 01/25/2018  . Tobacco abuse 01/25/2018  . Lower extremity cellulitis 01/25/2018  . Benzodiazepine dependence ([place])   . MDD (major depressive disorder), recurrent severe, without psychosis ([place]) 10/02/2017  . CTS (carpal tunnel syndrome) 06/08/2014    Tyreshia Ingman, PT 10/05/19 2:52 PM  PHYSICAL THERAPY DISCHARGE SUMMARY  Visits from Start of Care: 10  Current functional level related to goals / functional  outcomes: See above   Remaining deficits: See above  Education / Equipment: HEP Plan: Patient agrees to discharge.  Patient goals were met. Patient is being discharged due to meeting the stated rehab goals.  ?????         Baruch Merl, PT 02/01/20 10:02 AM   [facility] [address] [place], [place], [address] Phone: [phone]   Fax:  [phone]  Name: PHYLLISS STREGE MRN: [id number] Date of Birth: [birth year]

## 2019-10-08 ENCOUNTER — Encounter: Payer: Medicare PPO | Admitting: Physical Therapy

## 2019-10-12 ENCOUNTER — Encounter: Payer: Medicare PPO | Admitting: Physical Therapy

## 2019-10-15 ENCOUNTER — Encounter: Payer: Medicare PPO | Admitting: Physical Therapy

## 2019-10-25 DIAGNOSIS — Z1159 Encounter for screening for other viral diseases: Secondary | ICD-10-CM | POA: Diagnosis not present

## 2019-10-28 DIAGNOSIS — R195 Other fecal abnormalities: Secondary | ICD-10-CM | POA: Diagnosis not present

## 2019-10-28 DIAGNOSIS — K635 Polyp of colon: Secondary | ICD-10-CM | POA: Diagnosis not present

## 2019-10-28 DIAGNOSIS — D122 Benign neoplasm of ascending colon: Secondary | ICD-10-CM | POA: Diagnosis not present

## 2019-10-28 DIAGNOSIS — Q438 Other specified congenital malformations of intestine: Secondary | ICD-10-CM | POA: Diagnosis not present

## 2019-10-28 DIAGNOSIS — K648 Other hemorrhoids: Secondary | ICD-10-CM | POA: Diagnosis not present

## 2019-10-28 DIAGNOSIS — K5730 Diverticulosis of large intestine without perforation or abscess without bleeding: Secondary | ICD-10-CM | POA: Diagnosis not present

## 2019-11-02 DIAGNOSIS — K635 Polyp of colon: Secondary | ICD-10-CM | POA: Diagnosis not present

## 2019-11-02 DIAGNOSIS — D122 Benign neoplasm of ascending colon: Secondary | ICD-10-CM | POA: Diagnosis not present

## 2019-11-04 DIAGNOSIS — I5032 Chronic diastolic (congestive) heart failure: Secondary | ICD-10-CM | POA: Diagnosis not present

## 2019-11-11 ENCOUNTER — Other Ambulatory Visit: Payer: Self-pay | Admitting: Internal Medicine

## 2019-11-15 DIAGNOSIS — F1320 Sedative, hypnotic or anxiolytic dependence, uncomplicated: Secondary | ICD-10-CM | POA: Diagnosis not present

## 2019-11-15 DIAGNOSIS — F331 Major depressive disorder, recurrent, moderate: Secondary | ICD-10-CM | POA: Diagnosis not present

## 2019-11-15 DIAGNOSIS — F1120 Opioid dependence, uncomplicated: Secondary | ICD-10-CM | POA: Diagnosis not present

## 2019-11-15 DIAGNOSIS — F411 Generalized anxiety disorder: Secondary | ICD-10-CM | POA: Diagnosis not present

## 2019-11-26 DIAGNOSIS — Z23 Encounter for immunization: Secondary | ICD-10-CM | POA: Diagnosis not present

## 2019-11-26 DIAGNOSIS — W540XXA Bitten by dog, initial encounter: Secondary | ICD-10-CM | POA: Diagnosis not present

## 2019-11-26 DIAGNOSIS — S61452A Open bite of left hand, initial encounter: Secondary | ICD-10-CM | POA: Diagnosis not present

## 2019-11-29 DIAGNOSIS — F1320 Sedative, hypnotic or anxiolytic dependence, uncomplicated: Secondary | ICD-10-CM | POA: Diagnosis not present

## 2019-11-29 DIAGNOSIS — F331 Major depressive disorder, recurrent, moderate: Secondary | ICD-10-CM | POA: Diagnosis not present

## 2019-11-29 DIAGNOSIS — F1120 Opioid dependence, uncomplicated: Secondary | ICD-10-CM | POA: Diagnosis not present

## 2019-11-29 DIAGNOSIS — F411 Generalized anxiety disorder: Secondary | ICD-10-CM | POA: Diagnosis not present

## 2019-12-04 DIAGNOSIS — I5032 Chronic diastolic (congestive) heart failure: Secondary | ICD-10-CM | POA: Diagnosis not present

## 2019-12-10 ENCOUNTER — Other Ambulatory Visit: Payer: Self-pay | Admitting: Internal Medicine

## 2019-12-14 DIAGNOSIS — K8020 Calculus of gallbladder without cholecystitis without obstruction: Secondary | ICD-10-CM | POA: Diagnosis not present

## 2020-01-03 DIAGNOSIS — E663 Overweight: Secondary | ICD-10-CM | POA: Diagnosis not present

## 2020-01-03 DIAGNOSIS — M150 Primary generalized (osteo)arthritis: Secondary | ICD-10-CM | POA: Diagnosis not present

## 2020-01-03 DIAGNOSIS — Z6826 Body mass index (BMI) 26.0-26.9, adult: Secondary | ICD-10-CM | POA: Diagnosis not present

## 2020-01-03 DIAGNOSIS — M0579 Rheumatoid arthritis with rheumatoid factor of multiple sites without organ or systems involvement: Secondary | ICD-10-CM | POA: Diagnosis not present

## 2020-01-03 DIAGNOSIS — M2550 Pain in unspecified joint: Secondary | ICD-10-CM | POA: Diagnosis not present

## 2020-01-03 DIAGNOSIS — M79641 Pain in right hand: Secondary | ICD-10-CM | POA: Diagnosis not present

## 2020-01-03 DIAGNOSIS — M79642 Pain in left hand: Secondary | ICD-10-CM | POA: Diagnosis not present

## 2020-01-03 DIAGNOSIS — Z79899 Other long term (current) drug therapy: Secondary | ICD-10-CM | POA: Diagnosis not present

## 2020-01-04 DIAGNOSIS — I5032 Chronic diastolic (congestive) heart failure: Secondary | ICD-10-CM | POA: Diagnosis not present

## 2020-01-07 ENCOUNTER — Other Ambulatory Visit: Payer: Self-pay | Admitting: Physician Assistant

## 2020-01-27 DIAGNOSIS — F411 Generalized anxiety disorder: Secondary | ICD-10-CM | POA: Diagnosis not present

## 2020-01-27 DIAGNOSIS — F1320 Sedative, hypnotic or anxiolytic dependence, uncomplicated: Secondary | ICD-10-CM | POA: Diagnosis not present

## 2020-01-27 DIAGNOSIS — F331 Major depressive disorder, recurrent, moderate: Secondary | ICD-10-CM | POA: Diagnosis not present

## 2020-01-27 DIAGNOSIS — F1121 Opioid dependence, in remission: Secondary | ICD-10-CM | POA: Diagnosis not present

## 2020-01-31 DIAGNOSIS — F411 Generalized anxiety disorder: Secondary | ICD-10-CM | POA: Diagnosis not present

## 2020-01-31 DIAGNOSIS — F331 Major depressive disorder, recurrent, moderate: Secondary | ICD-10-CM | POA: Diagnosis not present

## 2020-01-31 DIAGNOSIS — F1121 Opioid dependence, in remission: Secondary | ICD-10-CM | POA: Diagnosis not present

## 2020-01-31 DIAGNOSIS — F1320 Sedative, hypnotic or anxiolytic dependence, uncomplicated: Secondary | ICD-10-CM | POA: Diagnosis not present

## 2020-02-03 ENCOUNTER — Telehealth: Payer: Self-pay | Admitting: Internal Medicine

## 2020-02-03 DIAGNOSIS — I5032 Chronic diastolic (congestive) heart failure: Secondary | ICD-10-CM | POA: Diagnosis not present

## 2020-02-03 NOTE — Progress Notes (Signed)
Cardiology Office Note   Date:  02/04/2020   ID:  Meghan Schwartz, DOB [birth year], MRN [id number]  PCP:  Shirlean Mylar, MD  Cardiologist:   Dietrich Pates, MD   F/U of CAD     History of Present Illness:  Meghan Schwartz is a 69 y.o. female with a hx of CAD   She is s/p NSTEMI in 01/2018   Underwent DES to RCA (99 to ) 20% LAD  40% LCx   Also has chronic diastolic CHF, RA and HL   Echo had been done that showed normal LVEF    I saw the pt back in July 2021 Taking torsemide daily    If doesn't gets swelling  Occasional dizziness with standing  \ No CP   2 days ago had fleeting pain in L arm   Quality was like she had  prior to MI   Went away quickly     Current Meds  Medication Sig  . acetaminophen (TYLENOL) 500 MG tablet Take 500 mg by mouth every 6 (six) hours as needed (For knee pain).  . cholecalciferol (VITAMIN D3) 25 MCG (1000 UT) tablet Take 1,000 Units by mouth daily.  . clonazePAM (KLONOPIN) 1 MG tablet Take 0.5-1 mg by mouth See admin instructions. 0.5 mg midday, 1 mg at bedtime  . cloNIDine (CATAPRES) 0.1 MG tablet Take 0.1 mg by mouth daily.  . clopidogrel (PLAVIX) 75 MG tablet Take 1 tablet (75 mg total) by mouth daily.  Elgie Collard SURECLICK 50 MG/ML injection Inject 50 mg into the skin once a week.  . escitalopram (LEXAPRO) 10 MG tablet Take 10 mg by mouth daily.   Marland Kitchen estradiol (VIVELLE-DOT) 0.075 MG/24HR Place 1 patch onto the skin 2 (two) times a week. Tuesday and Friday  . fexofenadine (ALLEGRA) 180 MG tablet Take 180 mg by mouth daily.  . hydroxychloroquine (PLAQUENIL) 200 MG tablet Take 200 mg by mouth 2 (two) times daily.   . hydrOXYzine (ATARAX/VISTARIL) 25 MG tablet Take 1 tablet (25 mg total) by mouth every 6 (six) hours as needed for anxiety.  . metoprolol tartrate (LOPRESSOR) 25 MG tablet TAKE 1 TABLET(25 MG) BY MOUTH DAILY  . Multiple Vitamins-Minerals (MULTIVITAMIN WITH MINERALS) tablet Take 1 tablet by mouth daily.  . nitroGLYCERIN (NITROSTAT) 0.4 MG SL tablet Place  1 tablet (0.4 mg total) under the tongue every 5 (five) minutes as needed for chest pain.  . potassium chloride SA (KLOR-CON) 20 MEQ tablet Take 1 tablet (20 mEq total) by mouth daily.  . rosuvastatin (CRESTOR) 20 MG tablet TAKE 1 TABLET(20 MG) BY MOUTH DAILY  . ZUBSOLV 5.7-1.4 MG SUBL Place 1 tablet under the tongue 3 (three) times daily.     Allergies:   Erythromycin   Past Medical History:  Diagnosis Date  . Allergic rhinitis   . Anxiety   . Benzodiazepine dependence ([facility])   . Bruxism   . CAD (coronary artery disease)    S/p NSTEMI 01/2018 >> LHC: oLAD 20, pLAD 20; oD1 20, oLCx 40, pRCA 99 >> PCI: DES to prox RCA // Echo 01/2018: EF 50-55; prob inf-lat and inf HK  . Chronic diastolic heart failure    Echocardiogram 07/2018: EF 55-60, grade 1 diastolic dysfunction, normal wall motion, normal GLS (-22.3), PASP 28  . Chronic foot pain    bunions, torn ligaments-on chronic pain medication  . CTS (carpal tunnel syndrome) 06/08/2014  . Depression   . High cholesterol   . Hypertension   . MDD (  major depressive disorder) 10/02/2017  . Migraine   . Myocardial infarction ([facility]) 02/01/2018  . Pneumonia 1986   left lung  . RA (rheumatoid arthritis) ([facility])     Past Surgical History:  Procedure Laterality Date  . BTL  2000  . CARPAL TUNNEL RELEASE Left   . CESAREAN SECTION  1978. 1983  . CORONARY STENT INTERVENTION N/A 01/27/2018   Procedure: CORONARY STENT INTERVENTION;  Surgeon: Kathleene Hazel, MD;  [facility]: [facility];  Service: Cardiovascular;  Laterality: N/A;  . eye implants    . FOOT SURGERY Left 2008  . front teeth removed after injury    . KNEE ARTHROSCOPY WITH LATERAL MENISECTOMY Left 02/02/2019   Procedure: LEFT KNEE ARTHROSCOPY, MEDIAL AND LATERAL MENISECTOMY, EXCISION OF LEFT LATERAL MENISCAL CYST;  Surgeon: Valeria Batman, MD;  Location: [facility];  Service: Orthopedics;  Laterality: Left;  . LEFT HEART CATH AND CORONARY ANGIOGRAPHY N/A 01/27/2018    Procedure: LEFT HEART CATH AND CORONARY ANGIOGRAPHY;  Surgeon: Kathleene Hazel, MD;  Location: [facility];  Service: Cardiovascular;  Laterality: N/A;  . pre cancerous lesion removed from forehead    . TOTAL ABDOMINAL HYSTERECTOMY W/ BILATERAL SALPINGOOPHORECTOMY  2002   Dr. Jackelyn Knife  . VAGINAL HYSTERECTOMY  2001     Social History:  The patient  reports that she quit smoking about 9 years ago. Her smoking use included cigarettes. She quit after 20.00 years of use. She has never used smokeless tobacco. She reports that she does not drink alcohol and does not use drugs.   Family History:  The patient's family history includes Arthritis in her maternal grandmother; Breast cancer in her mother; Heart attack in her maternal grandmother and paternal grandfather; Parkinson's disease in her father; Raynaud syndrome in her maternal aunt; Stroke in her paternal grandmother; Thyroid disease in her mother.    ROS:  Please see the history of present illness. All other systems are reviewed and  Negative to the above problem except as noted.    PHYSICAL EXAM: VS:  BP 110/70   Pulse 76   Wt 155 lb 6.4 oz (70.5 kg)   SpO2 95%   BMI 25.86 kg/m   GEN: Well nourished, well developed, in no acute distress  HEENT: normal  Neck: no JVD,  Cardiac: RRR; no murmurs;    NO LE  edema  Respiratory:  clear to auscultation bilaterally,  GI: soft, nontender, nondistended, + BS  No hepatomegaly  MS: no deformity Moving all extremities   Skin: warm and dry, no rash Neuro: no gross defects  Psych: euthymic mood, full affect   EKG:  EKG is not ordered today.   Echo:  Jan 2021  Left ventricular ejection fraction, by visual estimation, is 60 to 65%. The left ventricle has normal function. There is no left ventricular hypertrophy. 2. The left ventricle has no regional wall motion abnormalities. 3. Global right ventricle has normal systolic function.The right ventricular size is normal.  No increase in right ventricular wall thickness. 4. Left atrial size was normal. 5. Right atrial size was normal. 6. The mitral valve is normal in structure. Trivial mitral valve regurgitation. No evidence of mitral stenosis. 7. The tricuspid valve is normal in structure. 8. The aortic valve is tricuspid. Aortic valve regurgitation is not visualized. Mild aortic valve sclerosis without stenosis. 9. The pulmonic valve was not well visualized. Pulmonic valve regurgitation is not visualized. 10. The inferior vena cava is normal in size with greater than  50% respiratory variability, suggesting right atrial pressure of 3 mmHg. 11. Normal LV function.  Lipid Panel    Component Value Date/Time   CHOL 122 08/11/2018 1135   TRIG 71 08/11/2018 1135   HDL 62 08/11/2018 1135   CHOLHDL 2.0 08/11/2018 1135   CHOLHDL 2.7 01/28/2018 0353   VLDL 16 01/28/2018 0353   LDLCALC 46 08/11/2018 1135      Wt Readings from Last 3 Encounters:  02/04/20 155 lb 6.4 oz (70.5 kg)  09/24/19 156 lb (70.8 kg)  04/06/19 153 lb (69.4 kg)      ASSESSMENT AND PLAN:  1 [place] diastolic CHF   Volume status is OK   Continue current regimen  Follow up labs today   2 CAD One fleeting episode of arm pain  NOthing else to sugg angina   Follow   3  HL  Will get lipids  Discussed diet   Cut back on sugar  She admits to having a sweet tooth   This may help RA      F/U in clinic in 8 months      Current medicines are reviewed at length with the patient today.  The patient does not have concerns regarding medicines.  Signed, Dietrich Pates, MD  02/04/2020 4:37 PM    [facility] [address] [place], [place], [place]  [address] Phone: [phone]; Fax: [phone]

## 2020-02-03 NOTE — Telephone Encounter (Signed)
Encounter not needed

## 2020-02-04 ENCOUNTER — Ambulatory Visit: Payer: Medicare PPO | Admitting: Internal Medicine

## 2020-02-04 ENCOUNTER — Other Ambulatory Visit: Payer: Self-pay

## 2020-02-04 VITALS — BP 110/70 | HR 76 | Wt 155.4 lb

## 2020-02-04 DIAGNOSIS — E782 Mixed hyperlipidemia: Secondary | ICD-10-CM

## 2020-02-04 DIAGNOSIS — I2510 Atherosclerotic heart disease of native coronary artery without angina pectoris: Secondary | ICD-10-CM

## 2020-02-04 NOTE — Patient Instructions (Signed)
Medication Instructions:  No changes *If you need a refill on your cardiac medications before your next appointment, please call your pharmacy*   Lab Work: Today: lipids, cbc, bmet If you have labs (blood work) drawn today and your tests are completely normal, you will receive your results only by: Marland Kitchen MyChart Message (if you have MyChart) OR . A paper copy in the mail If you have any lab test that is abnormal or we need to change your treatment, we will call you to review the results.   Testing/Procedures: none   Follow-Up: At [facility], you and your health needs are our priority.  As part of our continuing mission to provide you with exceptional heart care, we have created designated Provider Care Teams.  These Care Teams include your primary Cardiologist (physician) and Advanced Practice Providers (APPs -  Physician Assistants and Nurse Practitioners) who all work together to provide you with the care you need, when you need it.   Your next appointment:   8 month(s)  The format for your next appointment:   In Person  Provider:   You may see Dietrich Pates, MD or one of the following Advanced Practice Providers on your designated Care Team:    Tereso Newcomer, PA-C  Chelsea Aus, [place]    Other Instructions

## 2020-02-05 LAB — CBC
Hematocrit: 39.5 % (ref 34.0–46.6)
Hemoglobin: 13.4 g/dL (ref 11.1–15.9)
MCH: 27.8 pg (ref 26.6–33.0)
MCHC: 33.9 g/dL (ref 31.5–35.7)
MCV: 82 fL (ref 79–97)
Platelets: 209 10*3/uL (ref 150–450)
RBC: 4.82 x10E6/uL (ref 3.77–5.28)
RDW: 12.2 % (ref 11.7–15.4)
WBC: 5.2 10*3/uL (ref 3.4–10.8)

## 2020-02-05 LAB — BASIC METABOLIC PANEL
BUN/Creatinine Ratio: 19 (ref 12–28)
BUN: 19 mg/dL (ref 8–27)
CO2: 28 mmol/L (ref 20–29)
Calcium: 9.4 mg/dL (ref 8.7–10.3)
Chloride: 97 mmol/L (ref 96–106)
Creatinine, Ser: 1.01 mg/dL — ABNORMAL HIGH (ref 0.57–1.00)
GFR calc Af Amer: 65 mL/min/{1.73_m2} (ref >59)
GFR calc non Af Amer: 57 mL/min/{1.73_m2} — ABNORMAL LOW (ref >59)
Glucose: 93 mg/dL (ref 65–99)
Potassium: 4.6 mmol/L (ref 3.5–5.2)
Sodium: 143 mmol/L (ref 134–144)

## 2020-02-05 LAB — LIPID PANEL
Chol/HDL Ratio: 2.6 ratio (ref 0.0–4.4)
Cholesterol, Total: 171 mg/dL (ref 100–199)
HDL: 67 mg/dL (ref >39)
LDL Chol Calc (NIH): 88 mg/dL (ref 0–99)
Triglycerides: 88 mg/dL (ref 0–149)
VLDL Cholesterol Cal: 16 mg/dL (ref 5–40)

## 2020-02-07 ENCOUNTER — Telehealth: Payer: Self-pay | Admitting: Internal Medicine

## 2020-02-07 NOTE — Telephone Encounter (Signed)
° ° °  Pt returning call to get lab results °

## 2020-02-08 NOTE — Telephone Encounter (Signed)
Left message of results (DPR) and to call back if any questions or if pt would like to discuss further.

## 2020-02-10 ENCOUNTER — Other Ambulatory Visit: Payer: Self-pay | Admitting: Physician Assistant

## 2020-02-10 MED ORDER — POTASSIUM CHLORIDE CRYS ER 20 MEQ PO TBCR: 20.0000 meq | EXTENDED_RELEASE_TABLET | Freq: Every day | ORAL | 3 refills | Status: DC

## 2020-03-01 DIAGNOSIS — R52 Pain, unspecified: Secondary | ICD-10-CM | POA: Diagnosis not present

## 2020-03-01 DIAGNOSIS — R062 Wheezing: Secondary | ICD-10-CM | POA: Diagnosis not present

## 2020-03-01 DIAGNOSIS — Z20822 Contact with and (suspected) exposure to covid-19: Secondary | ICD-10-CM | POA: Diagnosis not present

## 2020-03-02 DIAGNOSIS — F411 Generalized anxiety disorder: Secondary | ICD-10-CM | POA: Diagnosis not present

## 2020-03-02 DIAGNOSIS — F1121 Opioid dependence, in remission: Secondary | ICD-10-CM | POA: Diagnosis not present

## 2020-03-02 DIAGNOSIS — F1320 Sedative, hypnotic or anxiolytic dependence, uncomplicated: Secondary | ICD-10-CM | POA: Diagnosis not present

## 2020-03-02 DIAGNOSIS — F331 Major depressive disorder, recurrent, moderate: Secondary | ICD-10-CM | POA: Diagnosis not present

## 2020-03-06 DIAGNOSIS — M150 Primary generalized (osteo)arthritis: Secondary | ICD-10-CM | POA: Diagnosis not present

## 2020-03-06 DIAGNOSIS — Z6826 Body mass index (BMI) 26.0-26.9, adult: Secondary | ICD-10-CM | POA: Diagnosis not present

## 2020-03-06 DIAGNOSIS — M2550 Pain in unspecified joint: Secondary | ICD-10-CM | POA: Diagnosis not present

## 2020-03-06 DIAGNOSIS — Z79899 Other long term (current) drug therapy: Secondary | ICD-10-CM | POA: Diagnosis not present

## 2020-03-06 DIAGNOSIS — M0579 Rheumatoid arthritis with rheumatoid factor of multiple sites without organ or systems involvement: Secondary | ICD-10-CM | POA: Diagnosis not present

## 2020-03-06 DIAGNOSIS — E663 Overweight: Secondary | ICD-10-CM | POA: Diagnosis not present

## 2020-03-06 DIAGNOSIS — M79642 Pain in left hand: Secondary | ICD-10-CM | POA: Diagnosis not present

## 2020-03-06 DIAGNOSIS — M79641 Pain in right hand: Secondary | ICD-10-CM | POA: Diagnosis not present

## 2020-04-05 ENCOUNTER — Telehealth: Payer: Self-pay | Admitting: Pharmacist

## 2020-04-05 NOTE — Telephone Encounter (Signed)
Pt called clinic stating she's having some permanent makeup done for her eyebrows. The technician wanted pt to make sure she didn't need to stop her blood thinner beforehand, procedure typically does not cause any bleeding.  Pt takes clopidogrel, advised her it's ok for her to have superficial cosmetic procedure done and remain on her antiplatelet medication. She verbalized understanding and was appreciative for assistance.

## 2020-04-11 DIAGNOSIS — F1121 Opioid dependence, in remission: Secondary | ICD-10-CM | POA: Diagnosis not present

## 2020-04-11 DIAGNOSIS — F331 Major depressive disorder, recurrent, moderate: Secondary | ICD-10-CM | POA: Diagnosis not present

## 2020-04-11 DIAGNOSIS — F1320 Sedative, hypnotic or anxiolytic dependence, uncomplicated: Secondary | ICD-10-CM | POA: Diagnosis not present

## 2020-04-11 DIAGNOSIS — F411 Generalized anxiety disorder: Secondary | ICD-10-CM | POA: Diagnosis not present

## 2020-04-17 ENCOUNTER — Telehealth: Payer: Self-pay

## 2020-04-17 NOTE — Telephone Encounter (Signed)
Patient needs exam before RX for lexapro can be filled.

## 2020-04-27 ENCOUNTER — Ambulatory Visit: Payer: Medicare PPO | Admitting: Internal Medicine

## 2020-05-02 ENCOUNTER — Other Ambulatory Visit: Payer: Self-pay | Admitting: Internal Medicine

## 2020-05-22 ENCOUNTER — Other Ambulatory Visit: Payer: Self-pay

## 2020-05-22 MED ORDER — POTASSIUM CHLORIDE CRYS ER 20 MEQ PO TBCR: 20.0000 meq | EXTENDED_RELEASE_TABLET | Freq: Every day | ORAL | 2 refills | Status: DC

## 2020-06-08 DIAGNOSIS — F1121 Opioid dependence, in remission: Secondary | ICD-10-CM | POA: Diagnosis not present

## 2020-06-08 DIAGNOSIS — F411 Generalized anxiety disorder: Secondary | ICD-10-CM | POA: Diagnosis not present

## 2020-06-08 DIAGNOSIS — F1320 Sedative, hypnotic or anxiolytic dependence, uncomplicated: Secondary | ICD-10-CM | POA: Diagnosis not present

## 2020-06-08 DIAGNOSIS — F331 Major depressive disorder, recurrent, moderate: Secondary | ICD-10-CM | POA: Diagnosis not present

## 2020-06-15 ENCOUNTER — Other Ambulatory Visit: Payer: Self-pay

## 2020-06-15 ENCOUNTER — Ambulatory Visit: Payer: Medicare PPO | Admitting: Orthopaedic Surgery

## 2020-06-15 ENCOUNTER — Encounter: Payer: Self-pay | Admitting: Orthopaedic Surgery

## 2020-06-15 ENCOUNTER — Ambulatory Visit (INDEPENDENT_AMBULATORY_CARE_PROVIDER_SITE_OTHER): Payer: Medicare PPO

## 2020-06-15 VITALS — Ht 65.0 in | Wt 155.0 lb

## 2020-06-15 DIAGNOSIS — M1712 Unilateral primary osteoarthritis, left knee: Secondary | ICD-10-CM | POA: Diagnosis not present

## 2020-06-15 DIAGNOSIS — M79671 Pain in right foot: Secondary | ICD-10-CM | POA: Diagnosis not present

## 2020-06-15 DIAGNOSIS — M21611 Bunion of right foot: Secondary | ICD-10-CM

## 2020-06-15 MED ORDER — BUPIVACAINE HCL 0.25 % IJ SOLN
2.0000 mL | INTRAMUSCULAR | Status: AC | PRN
  Administered 2020-06-15: 2 mL via INTRA_ARTICULAR

## 2020-06-15 MED ORDER — LIDOCAINE HCL 1 % IJ SOLN
2.0000 mL | INTRAMUSCULAR | Status: AC | PRN
  Administered 2020-06-15: 2 mL

## 2020-06-15 MED ORDER — METHYLPREDNISOLONE ACETATE 40 MG/ML IJ SUSP
80.0000 mg | INTRAMUSCULAR | Status: AC | PRN
  Administered 2020-06-15: 80 mg via INTRA_ARTICULAR

## 2020-06-15 NOTE — Progress Notes (Signed)
Office Visit Note   Patient: Meghan Schwartz           Date of Birth: 02-26-1950           MRN: [id number] Visit Date: 06/15/2020              Requested by: Shirlean Mylar, MD [address] [place],  [place] [address] PCP: Shirlean Mylar, MD   Assessment & Plan: Visit Diagnoses:  1. Pain in right foot   2. Bunion of great toe of right foot   3. Primary osteoarthritis of left knee     Plan: Mrs. Ungerer has osteoarthritis of her left knee with a recurrent effusion.  I am going to aspirate the knee and inject cortisone.  No discussion regarding knee replacement at this point.  Also has a significant bunion deformity of her right foot with lateral subluxation of the lesser toe metatarsal phalangeal joints.  There is complete dislocation of the great toe.  I have had a long discussion with her regarding treatment options including surgery and will refer her to Dr. Lajoyce Corners  Follow-Up Instructions: Return Refer to Dr. Lajoyce Corners for consideration of right foot surgery.   Orders:  Orders Placed This Encounter  Procedures  . Large Joint Inj: L knee  . XR Foot 2 Views Right  . Ambulatory referral to Orthopedic Surgery   No orders of the defined types were placed in this encounter.     Procedures: Large Joint Inj: L knee on 06/15/2020 4:26 PM Indications: pain and diagnostic evaluation Details: 25 G 1.5 in needle, anteromedial approach  Arthrogram: No  Medications: 2 mL lidocaine 1 %; 80 mg methylPREDNISolone acetate 40 MG/ML; 2 mL bupivacaine 0.25 % Aspirate: 35 mL clear Outcome: tolerated well, no immediate complications Procedure, treatment alternatives, risks and benefits explained, specific risks discussed. Consent was given by the patient. Patient was prepped and draped in the usual sterile fashion.       Clinical Data: No additional findings.   Subjective: Chief Complaint  Patient presents with  . Right Foot - Pain  Patient presents today for right foot pain. She  states that she has hammertoes and a bunion. She has had pain in her right foot for 20years. Worsening with time. She had previous left foot surgery with Dr.Bednarz and states that she it was traumatizing and does not want to go through that again.  However, she does have significant compromise of her activities relative to her right foot.  She has been wearing comfortable shoes but finds that she cannot be on her feet for a length of time.  Also has been having some recent swelling of her left knee.  She has had prior surgery for partial lateral meniscectomy and excision of a large meniscal cyst.  She does have evidence of tricompartmental degenerative arthritis by film  HPI  Review of Systems   Objective: Vital Signs: Ht 5\' 5"  (1.651 m)   Wt 155 lb (70.3 kg)   BMI 25.79 kg/m   Physical Exam Constitutional:      Appearance: She is well-developed.  Eyes:     Pupils: Pupils are equal, round, and reactive to light.  Pulmonary:     Effort: Pulmonary effort is normal.  Skin:    General: Skin is warm and dry.  Neurological:     Mental Status: She is alert and oriented to person, place, and time.  Psychiatric:        Behavior: Behavior normal.  Ortho Exam right foot with significant deformities.  There is lateral subluxation of all the lesser toes laterally and a large bunion with lateral position of the great toe in relationship to the metatarsal.  Does have altered sensibility of the great toe but has good capillary refill.  Has a callus on the plantar aspect of her foot between the first and second metatarsal heads.  Skin intact  Left knee with effusion and mostly lateral joint tenderness.  No instability.  Full extension.  Palpable popliteal cyst that was not uncomfortable Specialty Comments:  No specialty comments available.  Imaging: XR Foot 2 Views Right  Result Date: 06/15/2020 Films of the right foot were obtained in several projections.  There is complete dislocation of  the great toe metatarsal phalangeal joint with  The proximal phalanx lateral to the metatarsal head.  There are degenerative changes at the joint.  There is partial lateral subluxation of all the lesser toe metatarsal phalangeal joints    PMFS History: Patient Active Problem List   Diagnosis Date Noted  . Bunion of great toe of right foot 06/15/2020  . Hypoxia 03/05/2019  . Derangement of posterior horn of medial meniscus 02/02/2019  . Derangement of posterior horn of lateral meniscus 02/02/2019  . Meniscal cyst, unspecified laterality 12/01/2018  . Torn medial meniscus 12/01/2018  . Primary osteoarthritis of left knee 12/01/2018  . Coronary artery disease involving native coronary artery of native heart without angina pectoris 09/14/2018  . Chronic diastolic (congestive) heart failure (HCC)   . Chronic pain of left knee 05/15/2018  . Mass of left knee 05/15/2018  . Non-ST elevation (NSTEMI) myocardial infarction (HCC)   . Chest pain 01/25/2018  . RA (rheumatoid arthritis) (HCC) 01/25/2018  . Hyperlipidemia 01/25/2018  . Hypokalemia 01/25/2018  . Depression with anxiety 01/25/2018  . Tobacco abuse 01/25/2018  . Lower extremity cellulitis 01/25/2018  . Benzodiazepine dependence (HCC)   . MDD (major depressive disorder), recurrent severe, without psychosis (HCC) 10/02/2017  . CTS (carpal tunnel syndrome) 06/08/2014   Past Medical History:  Diagnosis Date  . Allergic rhinitis   . Anxiety   . Benzodiazepine dependence (HCC)   . Bruxism   . CAD (coronary artery disease)    S/p NSTEMI 01/2018 >> LHC: oLAD 20, pLAD 20; oD1 20, oLCx 40, pRCA 99 >> PCI: DES to prox RCA // Echo 01/2018: EF 50-55; prob inf-lat and inf HK  . Chronic diastolic heart failure    Echocardiogram 07/2018: EF 55-60, grade 1 diastolic dysfunction, normal wall motion, normal GLS (-22.3), PASP 28  . Chronic foot pain    bunions, torn ligaments-on chronic pain medication  . CTS (carpal tunnel syndrome) 06/08/2014   . Depression   . High cholesterol   . Hypertension   . MDD (major depressive disorder) 10/02/2017  . Migraine   . Myocardial infarction (HCC) 02/01/2018  . Pneumonia 1986   left lung  . RA (rheumatoid arthritis) (HCC)     Family History  Problem Relation Age of Onset  . Thyroid disease Mother   . Breast cancer Mother   . Parkinson's disease Father   . Heart attack Paternal Grandfather   . Stroke Paternal Grandmother   . Arthritis Maternal Grandmother   . Heart attack Maternal Grandmother   . Raynaud syndrome Maternal Aunt     Past Surgical History:  Procedure Laterality Date  . BTL  2000  . CARPAL TUNNEL RELEASE Left   . CESAREAN SECTION  1978. 1983  . CORONARY STENT  INTERVENTION N/A 01/27/2018   Procedure: CORONARY STENT INTERVENTION;  Surgeon: Kathleene Hazel, MD;  Location: [facility];  Service: Cardiovascular;  Laterality: N/A;  . eye implants    . FOOT SURGERY Left 2008  . front teeth removed after injury    . KNEE ARTHROSCOPY WITH LATERAL MENISECTOMY Left 02/02/2019   Procedure: LEFT KNEE ARTHROSCOPY, MEDIAL AND LATERAL MENISECTOMY, EXCISION OF LEFT LATERAL MENISCAL CYST;  Surgeon: Valeria Batman, MD;  Location: [facility];  Service: Orthopedics;  Laterality: Left;  . LEFT HEART CATH AND CORONARY ANGIOGRAPHY N/A 01/27/2018   Procedure: LEFT HEART CATH AND CORONARY ANGIOGRAPHY;  Surgeon: Kathleene Hazel, MD;  Location: [facility];  Service: Cardiovascular;  Laterality: N/A;  . pre cancerous lesion removed from forehead    . TOTAL ABDOMINAL HYSTERECTOMY W/ BILATERAL SALPINGOOPHORECTOMY  2002   Dr. Jackelyn Knife  . VAGINAL HYSTERECTOMY  2001   Social History   Occupational History  . Occupation: Retired   Tobacco Use  . Smoking status: Former Smoker    Years: 20.00    Types: Cigarettes    Quit date: 02/25/2010    Years since quitting: 10.3  . Smokeless tobacco: Never Used  . Tobacco comment: Restarted smoking earlier this year. smokes  during stressful time   Vaping Use  . Vaping Use: Never used  Substance and Sexual Activity  . Alcohol use: No    Alcohol/week: 0.0 standard drinks  . Drug use: No  . Sexual activity: Not on file

## 2020-06-26 ENCOUNTER — Ambulatory Visit: Payer: Medicare PPO | Admitting: Orthopedic Surgery

## 2020-06-26 DIAGNOSIS — M069 Rheumatoid arthritis, unspecified: Secondary | ICD-10-CM | POA: Diagnosis not present

## 2020-06-26 DIAGNOSIS — E782 Mixed hyperlipidemia: Secondary | ICD-10-CM | POA: Diagnosis not present

## 2020-06-26 DIAGNOSIS — I10 Essential (primary) hypertension: Secondary | ICD-10-CM | POA: Diagnosis not present

## 2020-06-26 DIAGNOSIS — Z0000 Encounter for general adult medical examination without abnormal findings: Secondary | ICD-10-CM | POA: Diagnosis not present

## 2020-06-26 DIAGNOSIS — F331 Major depressive disorder, recurrent, moderate: Secondary | ICD-10-CM | POA: Diagnosis not present

## 2020-06-26 DIAGNOSIS — I2510 Atherosclerotic heart disease of native coronary artery without angina pectoris: Secondary | ICD-10-CM | POA: Diagnosis not present

## 2020-06-27 ENCOUNTER — Ambulatory Visit: Payer: Medicare PPO | Admitting: Orthopedic Surgery

## 2020-06-27 DIAGNOSIS — M05771 Rheumatoid arthritis with rheumatoid factor of right ankle and foot without organ or systems involvement: Secondary | ICD-10-CM

## 2020-06-27 DIAGNOSIS — M2011 Hallux valgus (acquired), right foot: Secondary | ICD-10-CM

## 2020-06-27 DIAGNOSIS — M05772 Rheumatoid arthritis with rheumatoid factor of left ankle and foot without organ or systems involvement: Secondary | ICD-10-CM | POA: Diagnosis not present

## 2020-06-28 ENCOUNTER — Encounter: Payer: Self-pay | Admitting: Orthopedic Surgery

## 2020-06-28 NOTE — Progress Notes (Signed)
Office Visit Note   Patient: Meghan Schwartz           Date of Birth: 05-31-1950           MRN: [id number] Visit Date: 06/27/2020              Requested by: Valeria Batman, MD [address] [place],  [place] [address] PCP: Shirlean Mylar, MD  Chief Complaint  Patient presents with  . Right Foot - Pain      HPI: Patient is a 70 year old woman who is seen in referral from Dr. Cleophas Dunker.  Patient states that she has a long history of rheumatoid arthritis she is status post left foot reconstruction by Dr. Lestine Box about 10 years ago.  She states the left foot is slowly drifting back into a valgus deformity.  Patient states she has pain on the right foot primarily beneath the second metatarsal head with valgus deformity of all toes.  States she does have congestive heart failure, she had heart attack a year and a half ago she has undergone cardiac ultrasounds.  She states she is on medication for her rheumatoid arthritis.  Most recent cardiac echo was 60%.  Assessment & Plan: Visit Diagnoses:  1. Rheumatoid arthritis involving both feet with positive rheumatoid factor (HCC)   2. Hallux valgus (acquired), right foot     Plan: Discussed with the patient that surgical intervention is an option that would require Weil osteotomy for metatarsals 2 3 and 4 as well as has a opening wedge osteotomy the base of the first metatarsal and then fusion of the MTP joint.  Discussed with her rheumatoid arthritis and disease modifying drugs she has increased risk of infection wound healing complications need for additional surgery.  Patient states she understands and will call if she wishes to proceed with surgery.  Otherwise recommended a [place] wide sneaker with a sole orthotic with a metatarsal pad.  Follow-Up Instructions: Return if symptoms worsen or fail to improve.   Ortho Exam  Patient is alert, oriented, no adenopathy, well-dressed, normal affect, normal respiratory effort. Examination patient has  valgus deformity of all the toes of the right foot.  Her dorsalis pedis pulses weak however Doppler of the dorsalis pedis shows triphasic flow.  Patient has mild valgus deformity of the toes on the left foot with no ulcers.  Examination the right foot she has significant valgus deformity of the lesser and great toes.  Radiographs destructive bony changes of the MTP joint at the great toe with a complete dislocated valgus deformity of the MTP joint.  Patient has an increased metatarsal angle.  Radiograph also shows a long second third and fourth metatarsal.   Imaging: No results found. No images are attached to the encounter.  Labs: Lab Results  Component Value Date   CRP 3.7 (H) 03/05/2019     Lab Results  Component Value Date   ALBUMIN 2.7 (L) 03/06/2019   ALBUMIN 3.0 (L) 03/05/2019   ALBUMIN 4.2 11/13/2018    Lab Results  Component Value Date   MG 2.0 01/25/2018   MG 2.2 10/02/2017   No results found for: VD25OH  No results found for: PREALBUMIN CBC EXTENDED Latest Ref Rng & Units 02/04/2020 03/06/2019 03/05/2019  WBC 3.4 - 10.8 x10E3/uL 5.2 4.3 5.3  RBC 3.77 - 5.28 x10E6/uL 4.82 4.10 4.44  HGB 11.1 - 15.9 g/dL 03.2 10.8(L) 11.7(L)  HCT 34.0 - 46.6 % 39.5 34.9(L) 38.6  PLT 150 - 450 x10E3/uL 209 188  219  NEUTROABS 1.7 - 7.7 K/uL - - 4.1  LYMPHSABS 0.7 - 4.0 K/uL - - 0.7     There is no height or weight on file to calculate BMI.  Orders:  No orders of the defined types were placed in this encounter.  No orders of the defined types were placed in this encounter.    Procedures: No procedures performed  Clinical Data: No additional findings.  ROS:  All other systems negative, except as noted in the HPI. Review of Systems  Objective: Vital Signs: There were no vitals taken for this visit.  Specialty Comments:  No specialty comments available.  PMFS History: Patient Active Problem List   Diagnosis Date Noted  . Bunion of great toe of right foot 06/15/2020   . Hypoxia 03/05/2019  . Derangement of posterior horn of medial meniscus 02/02/2019  . Derangement of posterior horn of lateral meniscus 02/02/2019  . Meniscal cyst, unspecified laterality 12/01/2018  . Torn medial meniscus 12/01/2018  . Primary osteoarthritis of left knee 12/01/2018  . Coronary artery disease involving native coronary artery of native heart without angina pectoris 09/14/2018  . Chronic diastolic (congestive) heart failure (HCC)   . Chronic pain of left knee 05/15/2018  . Mass of left knee 05/15/2018  . Non-ST elevation (NSTEMI) myocardial infarction (HCC)   . Chest pain 01/25/2018  . RA (rheumatoid arthritis) (HCC) 01/25/2018  . Hyperlipidemia 01/25/2018  . Hypokalemia 01/25/2018  . Depression with anxiety 01/25/2018  . Tobacco abuse 01/25/2018  . Lower extremity cellulitis 01/25/2018  . Benzodiazepine dependence (HCC)   . MDD (major depressive disorder), recurrent severe, without psychosis (HCC) 10/02/2017  . CTS (carpal tunnel syndrome) 06/08/2014   Past Medical History:  Diagnosis Date  . Allergic rhinitis   . Anxiety   . Benzodiazepine dependence (HCC)   . Bruxism   . CAD (coronary artery disease)    S/p NSTEMI 01/2018 >> LHC: oLAD 20, pLAD 20; oD1 20, oLCx 40, pRCA 99 >> PCI: DES to prox RCA // Echo 01/2018: EF 50-55; prob inf-lat and inf HK  . Chronic diastolic heart failure    Echocardiogram 07/2018: EF 55-60, grade 1 diastolic dysfunction, normal wall motion, normal GLS (-22.3), PASP 28  . Chronic foot pain    bunions, torn ligaments-on chronic pain medication  . CTS (carpal tunnel syndrome) 06/08/2014  . Depression   . High cholesterol   . Hypertension   . MDD (major depressive disorder) 10/02/2017  . Migraine   . Myocardial infarction (HCC) 02/01/2018  . Pneumonia 1986   left lung  . RA (rheumatoid arthritis) (HCC)     Family History  Problem Relation Age of Onset  . Thyroid disease Mother   . Breast cancer Mother   . Parkinson's disease  Father   . Heart attack Paternal Grandfather   . Stroke Paternal Grandmother   . Arthritis Maternal Grandmother   . Heart attack Maternal Grandmother   . Raynaud syndrome Maternal Aunt     Past Surgical History:  Procedure Laterality Date  . BTL  2000  . CARPAL TUNNEL RELEASE Left   . CESAREAN SECTION  1978. 1983  . CORONARY STENT INTERVENTION N/A 01/27/2018   Procedure: CORONARY STENT INTERVENTION;  Surgeon: Kathleene Hazel, MD;  Location: [facility];  Service: Cardiovascular;  Laterality: N/A;  . eye implants    . FOOT SURGERY Left 2008  . front teeth removed after injury    . KNEE ARTHROSCOPY WITH LATERAL MENISECTOMY Left 02/02/2019  Procedure: LEFT KNEE ARTHROSCOPY, MEDIAL AND LATERAL MENISECTOMY, EXCISION OF LEFT LATERAL MENISCAL CYST;  Surgeon: Valeria Batman, MD;  Location: [facility];  Service: Orthopedics;  Laterality: Left;  . LEFT HEART CATH AND CORONARY ANGIOGRAPHY N/A 01/27/2018   Procedure: LEFT HEART CATH AND CORONARY ANGIOGRAPHY;  Surgeon: Kathleene Hazel, MD;  Location: [facility];  Service: Cardiovascular;  Laterality: N/A;  . pre cancerous lesion removed from forehead    . TOTAL ABDOMINAL HYSTERECTOMY W/ BILATERAL SALPINGOOPHORECTOMY  2002   Dr. Jackelyn Knife  . VAGINAL HYSTERECTOMY  2001   Social History   Occupational History  . Occupation: Retired   Tobacco Use  . Smoking status: Former Smoker    Years: 20.00    Types: Cigarettes    Quit date: 02/25/2010    Years since quitting: 10.3  . Smokeless tobacco: Never Used  . Tobacco comment: Restarted smoking earlier this year. smokes during stressful time   Vaping Use  . Vaping Use: Never used  Substance and Sexual Activity  . Alcohol use: No    Alcohol/week: 0.0 standard drinks  . Drug use: No  . Sexual activity: Not on file

## 2020-06-30 IMAGING — MG DIGITAL SCREENING BILAT W/ TOMO W/ CAD
8 series · 8 of 24 positions shown · non-contrast
Comparison: Previous exam(s).

CLINICAL DATA: Screening.

EXAM:
DIGITAL SCREENING BILATERAL MAMMOGRAM WITH TOMO AND CAD

[L CC synth-2D]
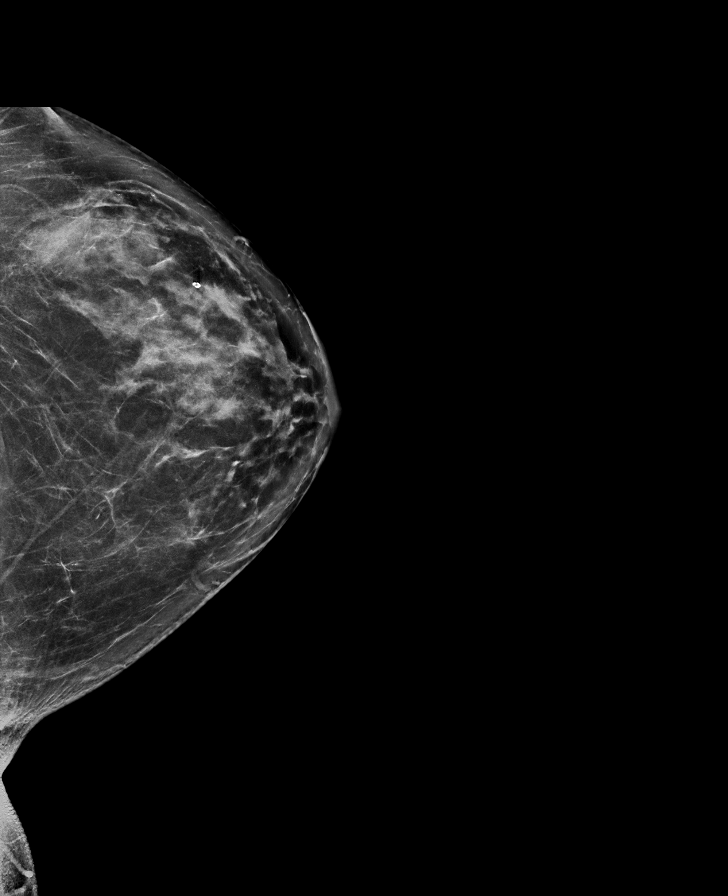

[R MLO synth-2D]
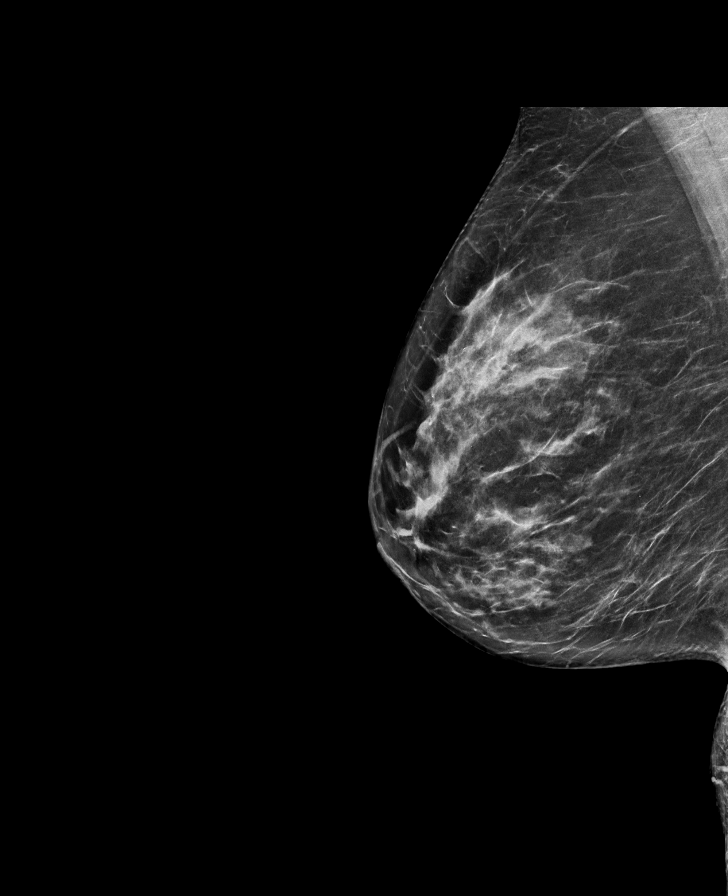

[L MLO synth-2D]
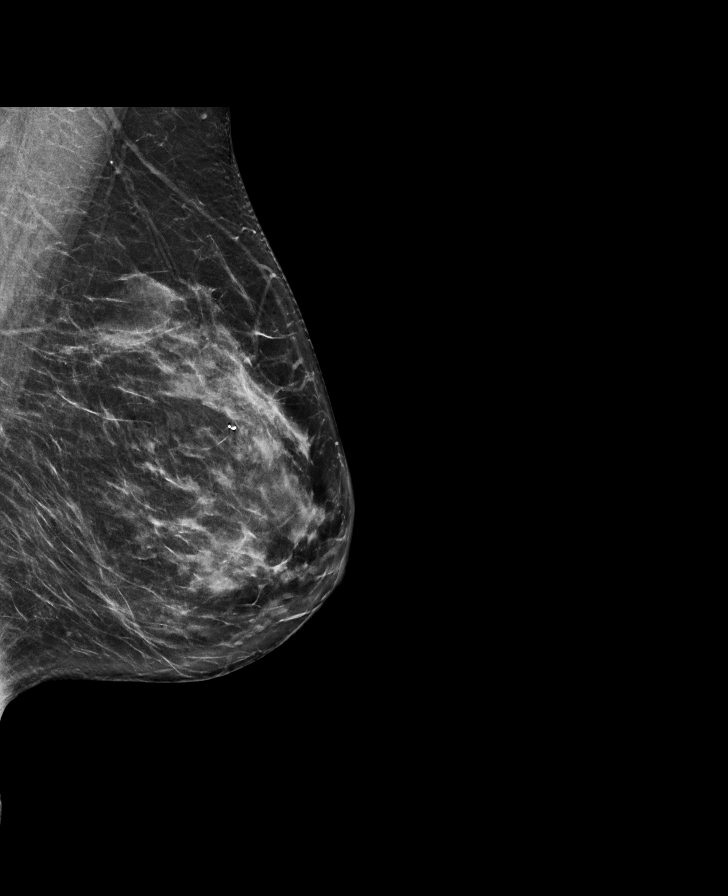

[R CC synth-2D]
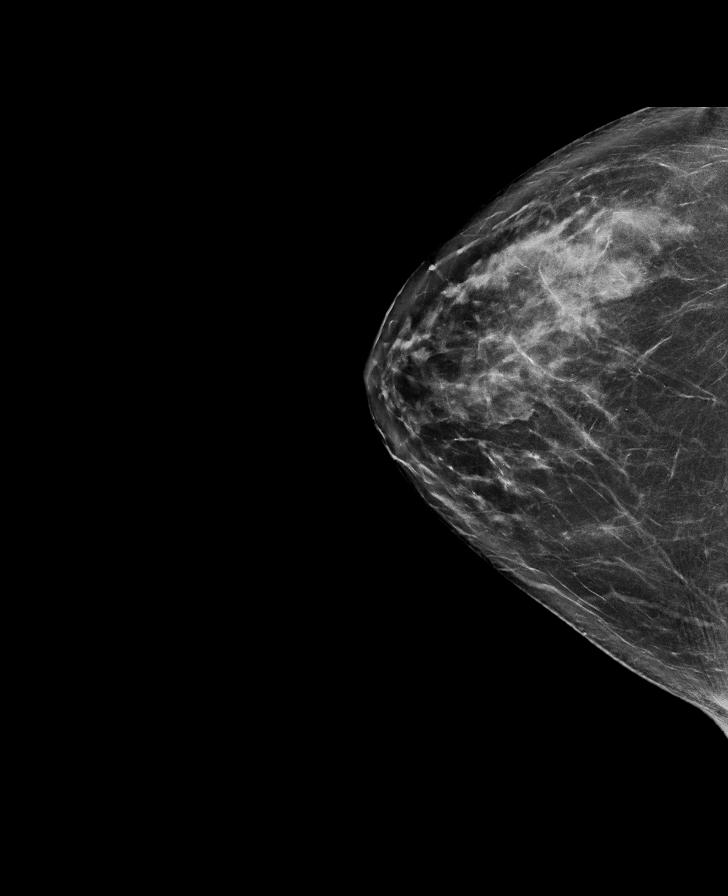

[R MLO tomo · tomo slice 37/73.0]
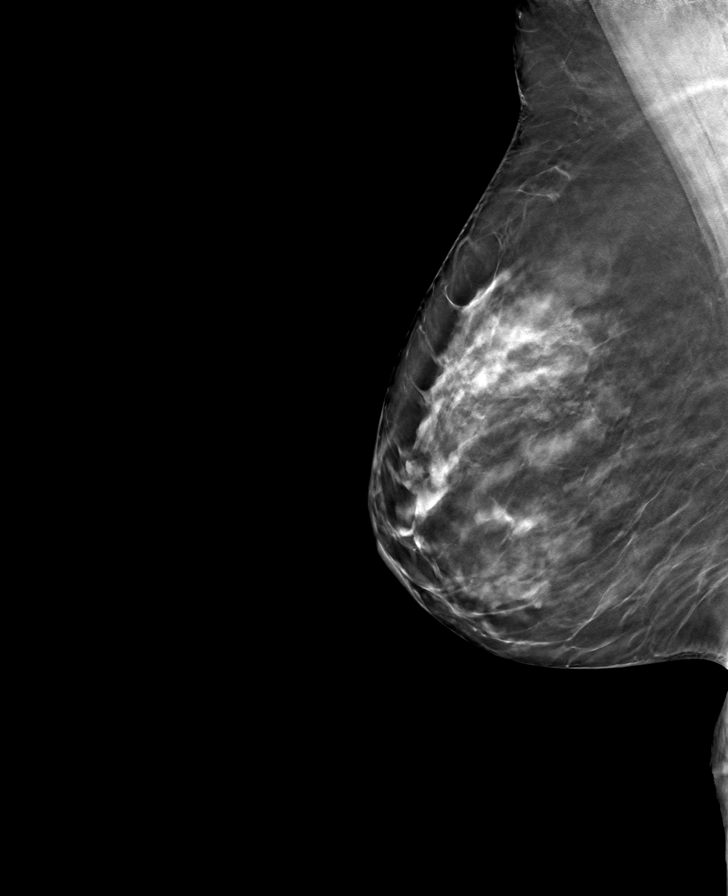

[L CC tomo · tomo slice 41/80.0]
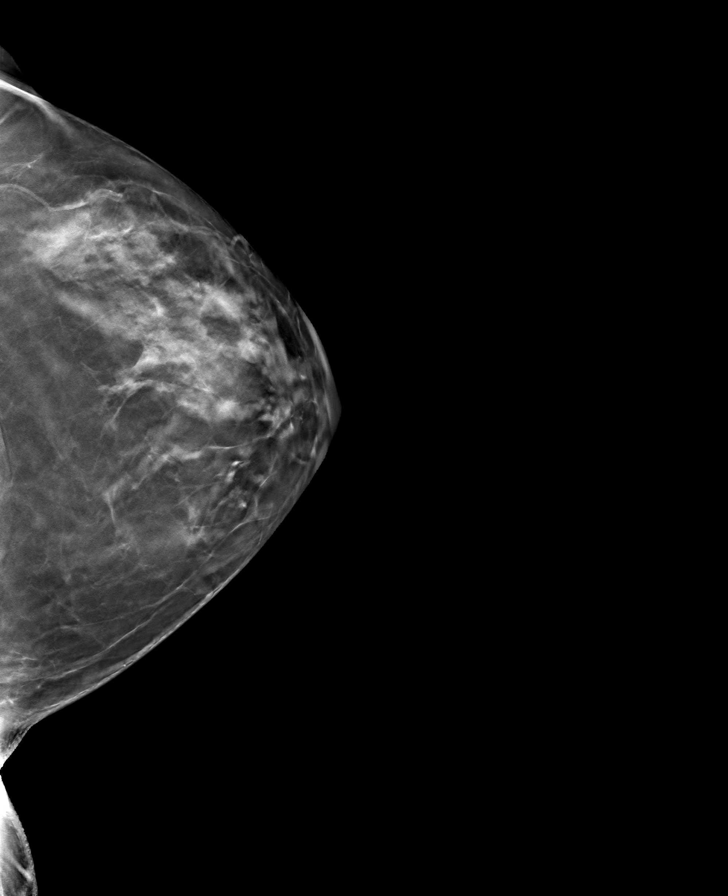

[L MLO tomo · tomo slice 37/73.0]
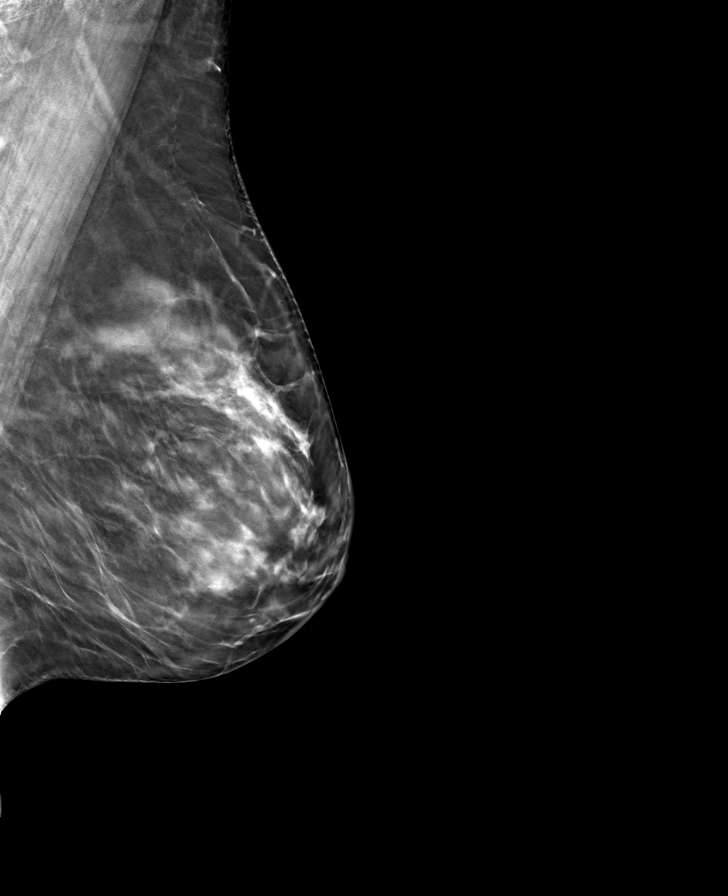

[R CC tomo · tomo slice 39/77.0]
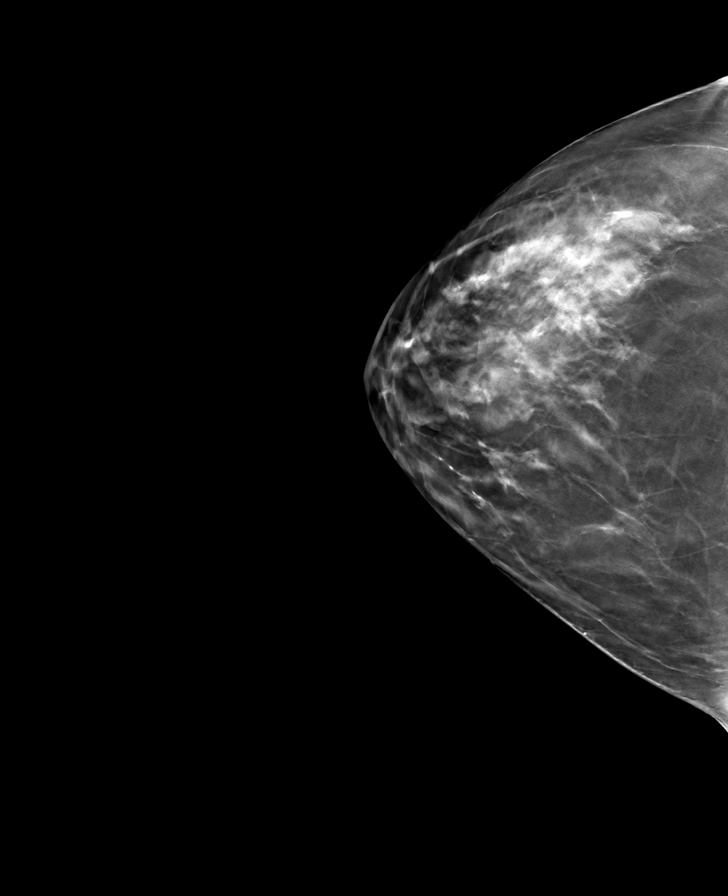

[8 of 24 positions shown; findings below may reference images not displayed]

ACR Breast Density Category c: The breast tissue is heterogeneously
dense, which may obscure small masses.
FINDINGS: There are no findings suspicious for malignancy. Images were
processed with CAD.
IMPRESSION: No mammographic evidence of malignancy. A result letter of this
screening mammogram will be mailed directly to the patient.

RECOMMENDATION:
Screening mammogram in one year. (Code:[id number])

BI-RADS CATEGORY  1: Negative.

## 2020-07-04 DIAGNOSIS — Z6828 Body mass index (BMI) 28.0-28.9, adult: Secondary | ICD-10-CM | POA: Diagnosis not present

## 2020-07-04 DIAGNOSIS — M150 Primary generalized (osteo)arthritis: Secondary | ICD-10-CM | POA: Diagnosis not present

## 2020-07-04 DIAGNOSIS — M79642 Pain in left hand: Secondary | ICD-10-CM | POA: Diagnosis not present

## 2020-07-04 DIAGNOSIS — M79641 Pain in right hand: Secondary | ICD-10-CM | POA: Diagnosis not present

## 2020-07-04 DIAGNOSIS — Z79899 Other long term (current) drug therapy: Secondary | ICD-10-CM | POA: Diagnosis not present

## 2020-07-04 DIAGNOSIS — R5383 Other fatigue: Secondary | ICD-10-CM | POA: Diagnosis not present

## 2020-07-04 DIAGNOSIS — M2550 Pain in unspecified joint: Secondary | ICD-10-CM | POA: Diagnosis not present

## 2020-07-04 DIAGNOSIS — M0579 Rheumatoid arthritis with rheumatoid factor of multiple sites without organ or systems involvement: Secondary | ICD-10-CM | POA: Diagnosis not present

## 2020-07-04 DIAGNOSIS — E663 Overweight: Secondary | ICD-10-CM | POA: Diagnosis not present

## 2020-07-13 DIAGNOSIS — L570 Actinic keratosis: Secondary | ICD-10-CM | POA: Diagnosis not present

## 2020-07-13 DIAGNOSIS — L578 Other skin changes due to chronic exposure to nonionizing radiation: Secondary | ICD-10-CM | POA: Diagnosis not present

## 2020-07-13 DIAGNOSIS — L821 Other seborrheic keratosis: Secondary | ICD-10-CM | POA: Diagnosis not present

## 2020-07-31 ENCOUNTER — Other Ambulatory Visit: Payer: Self-pay | Admitting: Internal Medicine

## 2020-08-03 DIAGNOSIS — F1121 Opioid dependence, in remission: Secondary | ICD-10-CM | POA: Diagnosis not present

## 2020-08-03 DIAGNOSIS — F1320 Sedative, hypnotic or anxiolytic dependence, uncomplicated: Secondary | ICD-10-CM | POA: Diagnosis not present

## 2020-08-03 DIAGNOSIS — F331 Major depressive disorder, recurrent, moderate: Secondary | ICD-10-CM | POA: Diagnosis not present

## 2020-08-03 DIAGNOSIS — F411 Generalized anxiety disorder: Secondary | ICD-10-CM | POA: Diagnosis not present

## 2020-08-07 DIAGNOSIS — U071 COVID-19: Secondary | ICD-10-CM | POA: Diagnosis not present

## 2020-08-30 ENCOUNTER — Telehealth: Payer: Self-pay | Admitting: Internal Medicine

## 2020-08-30 NOTE — Telephone Encounter (Signed)
New Message:    Pt says [employer] needs verification of the oxygen that the pt is on. Please call them at [phone]

## 2020-08-31 NOTE — Telephone Encounter (Signed)
[employer] [facility] is looking for help recertifying pt for her home oxygen.  I adv to contact her primary care provider as her cardiologist has not ordered the oxygen.

## 2020-09-05 ENCOUNTER — Telehealth: Payer: Self-pay | Admitting: Internal Medicine

## 2020-09-05 DIAGNOSIS — I5032 Chronic diastolic (congestive) heart failure: Secondary | ICD-10-CM

## 2020-09-05 NOTE — Telephone Encounter (Signed)
Pt c/o swelling: STAT is pt has developed SOB within 24 hours  How much weight have you gained and in what time span?  No weight gain  If swelling, where is the swelling located?  From her knees to her ankles   Are you currently taking a fluid pill? Yes, patient takes torsemide (DEMADEX) 20 MG tablet   Are you currently SOB?  Not currently, but she states she has been SOB the past couple of weeks   Do you have a log of your daily weights (if so, list)?  No log available  Have you gained 3 pounds in a day or 5 pounds in a week?  No weight gain   Have you traveled recently?  No traveling, but patient states she sits often throughout the day. Her legs are often red and throbbing in pain which makes it difficult for her to walk   Pt c/o Shortness Of Breath: STAT if SOB developed within the last 24 hours or pt is noticeably SOB on the phone  1. Are you currently SOB (can you hear that pt is SOB on the phone)?  Not currently  2. How long have you been experiencing SOB?  Past few weeks  3. Are you SOB when sitting or when up moving around? When up and moving around  4. Are you currently experiencing any other symptoms?  No

## 2020-09-05 NOTE — Telephone Encounter (Signed)
Left a message for the pt to call back.  

## 2020-09-06 NOTE — Telephone Encounter (Signed)
Pt aware to come for labs on 09/11/20.

## 2020-09-06 NOTE — Telephone Encounter (Signed)
Thx.  Agree with plan I would recomm she come in Monday for BMET and BNP as well

## 2020-09-06 NOTE — Telephone Encounter (Signed)
pt. Is calling back again

## 2020-09-06 NOTE — Telephone Encounter (Signed)
Patient is returning call.  °

## 2020-09-06 NOTE — Telephone Encounter (Signed)
Pt reports she cannot take a deep breath well and her breathing is labored with talking.    She reports to me that she has gained 25 pounds over the last 3 weeks.  She has been sporadically weighing in the mornings and been "hiding my head" every time she would gain a pound overnight.   She is taking demadex 20 mg daily.   I have asked her to weigh daily and record and double her torsemide to 40 mg for 3 days and call on Monday with how she is feeling.  I have scheduled her with Dr. Tenny Craw to follow up 7/22.  She is not having chest or left arm pain as she had with her heart attack.   We discussed limiting sodium, eats small amt of panara soup daily which she will cut out.

## 2020-09-11 ENCOUNTER — Other Ambulatory Visit: Payer: Self-pay

## 2020-09-11 ENCOUNTER — Other Ambulatory Visit: Payer: Medicare PPO | Admitting: *Deleted

## 2020-09-11 DIAGNOSIS — I5032 Chronic diastolic (congestive) heart failure: Secondary | ICD-10-CM

## 2020-09-11 NOTE — Telephone Encounter (Signed)
Pt is calling back to give a update

## 2020-09-11 NOTE — Telephone Encounter (Signed)
Pt came in to have labs drawn and asked to see me.    Her weight was 174 last week when we asked her to increase her diuretic for 3 days.  The weight increased to 175.6 and this am was down back to 174.  No real movement.  Her legs bilat are tight and tender and red.  The pt is tearful stating she was incontinent overnight one of the nights.  Today she took the double dose again (torsemide 40 mg).  Aware we will call her w recommendations once Dr. Tenny Craw reviews today's lab work.

## 2020-09-11 NOTE — Telephone Encounter (Signed)
Left message for EC to call back.  Called pt.  Left message for her to call back.

## 2020-09-12 LAB — BASIC METABOLIC PANEL
BUN/Creatinine Ratio: 15 (ref 12–28)
BUN: 13 mg/dL (ref 8–27)
CO2: 31 mmol/L — ABNORMAL HIGH (ref 20–29)
Calcium: 9.2 mg/dL (ref 8.7–10.3)
Chloride: 100 mmol/L (ref 96–106)
Creatinine, Ser: 0.88 mg/dL (ref 0.57–1.00)
Glucose: 85 mg/dL (ref 65–99)
Potassium: 3.8 mmol/L (ref 3.5–5.2)
Sodium: 145 mmol/L — ABNORMAL HIGH (ref 134–144)
eGFR: 70 mL/min/{1.73_m2} (ref >59)

## 2020-09-12 LAB — PRO B NATRIURETIC PEPTIDE: NT-Pro BNP: 342 pg/mL — ABNORMAL HIGH (ref 0–301)

## 2020-09-15 ENCOUNTER — Encounter: Payer: Self-pay | Admitting: Internal Medicine

## 2020-09-15 ENCOUNTER — Telehealth: Payer: Self-pay | Admitting: Internal Medicine

## 2020-09-15 ENCOUNTER — Ambulatory Visit: Payer: Medicare PPO | Admitting: Internal Medicine

## 2020-09-15 ENCOUNTER — Other Ambulatory Visit: Payer: Self-pay

## 2020-09-15 VITALS — BP 122/68 | HR 76 | Ht 64.0 in | Wt 173.8 lb

## 2020-09-15 DIAGNOSIS — I2510 Atherosclerotic heart disease of native coronary artery without angina pectoris: Secondary | ICD-10-CM | POA: Diagnosis not present

## 2020-09-15 DIAGNOSIS — R601 Generalized edema: Secondary | ICD-10-CM | POA: Diagnosis not present

## 2020-09-15 DIAGNOSIS — R140 Abdominal distension (gaseous): Secondary | ICD-10-CM | POA: Diagnosis not present

## 2020-09-15 DIAGNOSIS — I5032 Chronic diastolic (congestive) heart failure: Secondary | ICD-10-CM

## 2020-09-15 NOTE — Patient Instructions (Addendum)
Medication Instructions:  No changes today *If you need a refill on your cardiac medications before your next appointment, please call your pharmacy*   Lab Work: Today: STAT: cbc, cmet, bnp, UA, tsh, esr   Testing/Procedures: CT - abdomen/pelvis - follow instructions given today - to drink oral contrast as directed   Your physician has requested that you have an echocardiogram. Echocardiography is a painless test that uses sound waves to create images of your heart. It provides your doctor with information about the size and shape of your heart and how well your heart's chambers and valves are working. This procedure takes approximately one hour. There are no restrictions for this procedure.   Follow-Up: Follow up with your physician will depend on test results.

## 2020-09-15 NOTE — Telephone Encounter (Signed)
*  STAT* If patient is at the pharmacy, call can be transferred to refill team.   1. Which medications need to be refilled? (please list name of each medication and dose if known)  torsemide (DEMADEX) 20 MG tablet . Patient states she takes 40 mg daily  2. Which pharmacy/location (including street and city if local pharmacy) is medication to be sent to? [id number] - [place], [place] - [address]  3. Do they need a 30 day or 90 day supply? 90 with refills  Patient states that since she went up from 20 mg daily to 40 mg daily she has run out of medication and her pharmacy will not fill it. She needs Dr. Tenny Craw to write a new rx and send it to her pharmacy

## 2020-09-15 NOTE — Progress Notes (Signed)
Cardiology Office Note   Date:  09/16/2020   ID:  Meghan Schwartz, DOB October 04, 1950, MRN [id number]  PCP:  Shirlean Mylar, MD  Cardiologist:   Dietrich Pates, MD   F/U of CAD     History of Present Illness:  Meghan Schwartz is a 70 y.o. female with a hx of CAD   She is s/p NSTEMI in 01/2018   Underwent DES to RCA (99 to ) 20% LAD  40% LCx   Also has chronic diastolic CHF, RA and HL   Echo had been done that showed normal LVEF    I saw the pt in Dec 2021  The pt called in on July 12   Said she was SOB Said that her weight was up 25lb in 3 wks   She was on Demedex 20   Reocmm she increase to Demedex 40 mg for 3 day  Call back   Appt set for 7/22  Since that phone call the pt says she did not cut back on Demedex   Continued at 40 mg per day     She continues to have edema     Feels bad   Notes some wheezing     Denies CP        Current Meds  Medication Sig   acetaminophen (TYLENOL) 500 MG tablet Take 500 mg by mouth every 6 (six) hours as needed (For knee pain).   cholecalciferol (VITAMIN D3) 25 MCG (1000 UT) tablet Take 1,000 Units by mouth daily.   clonazePAM (KLONOPIN) 1 MG tablet Take 0.5-1 mg by mouth See admin instructions. 0.5 mg midday, 1 mg at bedtime   cloNIDine (CATAPRES) 0.1 MG tablet Take 0.1 mg by mouth daily.   clopidogrel (PLAVIX) 75 MG tablet TAKE 1 TABLET BY MOUTH EVERY DAY   ENBREL SURECLICK 50 MG/ML injection Inject 50 mg into the skin once a week.   escitalopram (LEXAPRO) 10 MG tablet Take 10 mg by mouth daily.    estradiol (VIVELLE-DOT) 0.075 MG/24HR Place 1 patch onto the skin 2 (two) times a week. Tuesday and Friday   Fexofenadine HCl (ALLEGRA ALLERGY PO)    hydroxychloroquine (PLAQUENIL) 200 MG tablet Take 200 mg by mouth 2 (two) times daily.    metoprolol tartrate (LOPRESSOR) 25 MG tablet TAKE 1 TABLET(25 MG) BY MOUTH DAILY   Multiple Vitamins-Minerals (MULTIVITAMIN WITH MINERALS) tablet Take 1 tablet by mouth daily.   nitroGLYCERIN (NITROSTAT) 0.4 MG SL tablet Place 1  tablet (0.4 mg total) under the tongue every 5 (five) minutes as needed for chest pain.   potassium chloride SA (KLOR-CON) 20 MEQ tablet Take 1 tablet (20 mEq total) by mouth daily.   rosuvastatin (CRESTOR) 20 MG tablet TAKE 1 TABLET BY MOUTH EVERY DAY   venlafaxine XR (EFFEXOR-XR) 150 MG 24 hr capsule    ZUBSOLV 5.7-1.4 MG SUBL Place 1 tablet under the tongue 3 (three) times daily.     Allergies:   Erythromycin   Past Medical History:  Diagnosis Date   Allergic rhinitis    Anxiety    Benzodiazepine dependence (HCC)    Bruxism    CAD (coronary artery disease)    S/p NSTEMI 01/2018 >> LHC: oLAD 20, pLAD 20; oD1 20, oLCx 40, pRCA 99 >> PCI: DES to prox RCA // Echo 01/2018: EF 50-55; prob inf-lat and inf HK   Chronic diastolic heart failure    Echocardiogram 07/2018: EF 55-60, grade 1 diastolic dysfunction, normal wall motion, normal GLS (-22.3), PASP  28   Chronic foot pain    bunions, torn ligaments-on chronic pain medication   CTS (carpal tunnel syndrome) 06/08/2014   Depression    High cholesterol    Hypertension    MDD (major depressive disorder) 10/02/2017   Migraine    Myocardial infarction (HCC) 02/01/2018   Pneumonia 1986   left lung   RA (rheumatoid arthritis) (HCC)     Past Surgical History:  Procedure Laterality Date   BTL  2000   CARPAL TUNNEL RELEASE Left    CESAREAN SECTION  1978. 1983   CORONARY STENT INTERVENTION N/A 01/27/2018   Procedure: CORONARY STENT INTERVENTION;  Surgeon: Kathleene Hazel, MD;  Location: [facility];  Service: Cardiovascular;  Laterality: N/A;   eye implants     FOOT SURGERY Left 2008   front teeth removed after injury     KNEE ARTHROSCOPY WITH LATERAL MENISECTOMY Left 02/02/2019   Procedure: LEFT KNEE ARTHROSCOPY, MEDIAL AND LATERAL MENISECTOMY, EXCISION OF LEFT LATERAL MENISCAL CYST;  Surgeon: Valeria Batman, MD;  Location: [facility];  Service: Orthopedics;  Laterality: Left;   LEFT HEART CATH AND CORONARY ANGIOGRAPHY N/A  01/27/2018   Procedure: LEFT HEART CATH AND CORONARY ANGIOGRAPHY;  Surgeon: Kathleene Hazel, MD;  Location: [facility];  Service: Cardiovascular;  Laterality: N/A;   pre cancerous lesion removed from forehead     TOTAL ABDOMINAL HYSTERECTOMY W/ BILATERAL SALPINGOOPHORECTOMY  2002   Dr. Jackelyn Knife   VAGINAL HYSTERECTOMY  2001     Social History:  The patient  reports that she has been smoking cigarettes. She has never used smokeless tobacco. She reports that she does not drink alcohol and does not use drugs.   Family History:  The patient's family history includes Arthritis in her maternal grandmother; Breast cancer in her mother; Heart attack in her maternal grandmother and paternal grandfather; Parkinson's disease in her father; Raynaud syndrome in her maternal aunt; Stroke in her paternal grandmother; Thyroid disease in her mother.    ROS:  Please see the history of present illness. All other systems are reviewed and  Negative to the above problem except as noted.    PHYSICAL EXAM: VS:  BP 122/68   Pulse 76   Ht 5\' 4"  (1.626 m)   Wt 173 lb 12.8 oz (78.8 kg)   SpO2 94%   BMI 29.83 kg/m   Sats 92% at rest and with walking   GEN: Well nourished, well developed, in no acute distress  HEENT:  Periorbital edema Neck: JVP is increase  Cardiac: RRR; no murmurs;   2 to 3+  edema  Respiratory:  clear to auscultation bilaterally,  GI: soft, nontender   Sl distended    MS: no deformity Moving all extremities   Skin: warm and dry, no rash Neuro: no gross defects  Psych: euthymic mood, full affect   EKG:  EKG is not ordered today.   Echo:  Jan 2021  Left ventricular ejection fraction, by visual estimation, is 60 to 65%. The left ventricle has normal function. There is no left ventricular hypertrophy. 2. The left ventricle has no regional wall motion abnormalities. 3. Global right ventricle has normal systolic function.The right ventricular size is normal. No increase in  right ventricular wall thickness. 4. Left atrial size was normal. 5. Right atrial size was normal. 6. The mitral valve is normal in structure. Trivial mitral valve regurgitation. No evidence of mitral stenosis. 7. The tricuspid valve is normal in structure. 8. The  aortic valve is tricuspid. Aortic valve regurgitation is not visualized. Mild aortic valve sclerosis without stenosis. 9. The pulmonic valve was not well visualized. Pulmonic valve regurgitation is not visualized. 10. The inferior vena cava is normal in size with greater than 50% respiratory variability, suggesting right atrial pressure of 3 mmHg. 11. Normal LV function.  Lipid Panel    Component Value Date/Time   CHOL 171 02/04/2020 1652   TRIG 88 02/04/2020 1652   HDL 67 02/04/2020 1652   CHOLHDL 2.6 02/04/2020 1652   CHOLHDL 2.7 01/28/2018 0353   VLDL 16 01/28/2018 0353   LDLCALC 88 02/04/2020 1652      Wt Readings from Last 3 Encounters:  09/15/20 173 lb 12.8 oz (78.8 kg)  06/15/20 155 lb (70.3 kg)  02/04/20 155 lb 6.4 oz (70.5 kg)      ASSESSMENT AND PLAN:  1  Edema  Pt with hx of diastolic CHF   But current volume increase apperas more like anasarca   Periorbital edema present   marked volume increase    Note albumen was low in past   ? If lowerwith dereaed oncotic pressure    She did not respond to increas in torsemide    Will get CMET , BNP, TSH   COnsider abdominal/pelvic CT to r/o some obstructive  COnsider repat echo   Discussed possible admit if cannot improve   There are no beds available today   As she is not hypoxic will try to eval as outpt  2 CAD  Last cardiac cath in 2019  Pt had 99% p RCA   Underwent PCI/DES of prox RCA   Mild dz elsewhere   >VEF 50 to 55% with inferior / inferolateral hypokinesis.   No symtpoms of CP       3  HL    Continue Crestor     4  Hx RA   Continue on Enbrel and Plaquenil  ? If     F/U in clinic in 8 months      Current medicines are reviewed at length with the  patient today.  The patient does not have concerns regarding medicines.  Signed, Dietrich Pates, MD  09/16/2020 11:45 PM    [facility] [address] [place], [place], [place]  [address] Phone: [phone]; Fax: [phone]

## 2020-09-16 LAB — PRO B NATRIURETIC PEPTIDE: NT-Pro BNP: 241 pg/mL (ref 0–301)

## 2020-09-16 LAB — CBC
Hematocrit: 34.1 % (ref 34.0–46.6)
Hemoglobin: 11.4 g/dL (ref 11.1–15.9)
MCH: 27.7 pg (ref 26.6–33.0)
MCHC: 33.4 g/dL (ref 31.5–35.7)
MCV: 83 fL (ref 79–97)
Platelets: 198 10*3/uL (ref 150–450)
RBC: 4.12 x10E6/uL (ref 3.77–5.28)
RDW: 13.3 % (ref 11.7–15.4)
WBC: 3.5 10*3/uL (ref 3.4–10.8)

## 2020-09-16 LAB — COMPREHENSIVE METABOLIC PANEL
ALT: 17 IU/L (ref 0–32)
AST: 31 IU/L (ref 0–40)
Albumin/Globulin Ratio: 1.5 (ref 1.2–2.2)
Albumin: 4.1 g/dL (ref 3.7–4.7)
Alkaline Phosphatase: 135 IU/L — ABNORMAL HIGH (ref 44–121)
BUN/Creatinine Ratio: 16 (ref 12–28)
BUN: 13 mg/dL (ref 8–27)
Bilirubin Total: 0.4 mg/dL (ref 0.0–1.2)
CO2: 28 mmol/L (ref 20–29)
Calcium: 9.3 mg/dL (ref 8.7–10.3)
Chloride: 96 mmol/L (ref 96–106)
Creatinine, Ser: 0.79 mg/dL (ref 0.57–1.00)
Globulin, Total: 2.7 g/dL (ref 1.5–4.5)
Glucose: 90 mg/dL (ref 65–99)
Potassium: 3.6 mmol/L (ref 3.5–5.2)
Sodium: 142 mmol/L (ref 134–144)
Total Protein: 6.8 g/dL (ref 6.0–8.5)
eGFR: 80 mL/min/{1.73_m2} (ref >59)

## 2020-09-16 LAB — URINALYSIS
Bilirubin, UA: NEGATIVE
Glucose, UA: NEGATIVE
Ketones, UA: NEGATIVE
Leukocytes,UA: NEGATIVE
Nitrite, UA: NEGATIVE
RBC, UA: NEGATIVE
Specific Gravity, UA: 1.021 (ref 1.005–1.030)
Urobilinogen, Ur: 0.2 mg/dL (ref 0.2–1.0)
pH, UA: 5.5 (ref 5.0–7.5)

## 2020-09-16 LAB — TSH: TSH: 2.08 u[IU]/mL (ref 0.450–4.500)

## 2020-09-16 LAB — SEDIMENTATION RATE: Sed Rate: 28 mm/hr (ref 0–40)

## 2020-09-18 ENCOUNTER — Ambulatory Visit (INDEPENDENT_AMBULATORY_CARE_PROVIDER_SITE_OTHER)
Admission: RE | Admit: 2020-09-18 | Discharge: 2020-09-18 | Disposition: A | Discharge status: Home / Self Care | Payer: Medicare PPO | Source: Ambulatory Visit | Attending: Internal Medicine | Admitting: Internal Medicine

## 2020-09-18 ENCOUNTER — Telehealth: Payer: Self-pay | Admitting: Internal Medicine

## 2020-09-18 ENCOUNTER — Other Ambulatory Visit: Payer: Self-pay

## 2020-09-18 DIAGNOSIS — R140 Abdominal distension (gaseous): Secondary | ICD-10-CM | POA: Diagnosis not present

## 2020-09-18 DIAGNOSIS — I5032 Chronic diastolic (congestive) heart failure: Secondary | ICD-10-CM

## 2020-09-18 DIAGNOSIS — R601 Generalized edema: Secondary | ICD-10-CM

## 2020-09-18 DIAGNOSIS — K760 Fatty (change of) liver, not elsewhere classified: Secondary | ICD-10-CM

## 2020-09-18 DIAGNOSIS — I2510 Atherosclerotic heart disease of native coronary artery without angina pectoris: Secondary | ICD-10-CM | POA: Diagnosis not present

## 2020-09-18 DIAGNOSIS — K8020 Calculus of gallbladder without cholecystitis without obstruction: Secondary | ICD-10-CM

## 2020-09-18 MED ORDER — METOLAZONE 2.5 MG PO TABS: 2.5000 mg | ORAL_TABLET | Freq: Every day | ORAL | 3 refills | Status: DC

## 2020-09-18 MED ORDER — TORSEMIDE 20 MG PO TABS: 20.0000 mg | ORAL_TABLET | Freq: Two times a day (BID) | ORAL | 3 refills | Status: DC

## 2020-09-18 MED ORDER — IOHEXOL 300 MG/ML  SOLN
100.0000 mL | Freq: Once | INTRAMUSCULAR | Status: AC | PRN
  Administered 2020-09-18: 100 mL via INTRAVENOUS

## 2020-09-18 NOTE — Telephone Encounter (Signed)
Pt at office having abd imaging today.  Reviewed plan of care in person.  She is taking both torsemide 20 mg tablets in am.  She will take Metolazone 2.5 mg 30 min prior to daily dose of torsemide and return in one week for BMET.

## 2020-09-18 NOTE — Telephone Encounter (Signed)
Pt c/o swelling: STAT is pt has developed SOB within 24 hours  If swelling, where is the swelling located? Both legs up to her knee  How much weight have you gained and in what time span? 5 pounds in about two days   Have you gained 3 pounds in a day or 5 pounds in a week? Yes  Do you have a log of your daily weights (if so, list)? 7/21- 168.7, 7/22- 178.2, 7/23- 175.4, 7/24 177.3, 7/25- 178.5  Are you currently taking a fluid pill? yes  Are you currently SOB? Yes slightly  Have you traveled recently? No      gaining more fluid weight even though she has up'd the pts dosage on fluid pill, pt is in pain and legs are swollen and red up to the knee, hot and tight. Oxygen level is 87. pt states if a hospital admission will assist in her being seen sooner she is ready.

## 2020-09-18 NOTE — Telephone Encounter (Signed)
Michalene RN took care of this today.  Message left on pt's vm this is complete.

## 2020-09-18 NOTE — Telephone Encounter (Signed)
Spoke with the pt and she was seen this past Friday 09/15/20 and she says that she was very uncomfortable over the weekend... she says that her fluid in her body is out of control and she feels she needs more intense care... she says that Dr. Tenny Craw had mentioned that possibly she may need admission and she is asking if we can make that happen for her outside of going through the ED.. I will forward to Dr. Tenny Craw for review.

## 2020-09-18 NOTE — Telephone Encounter (Signed)
-----   Message from Sampson Goon, RN sent at 09/18/2020  9:54 AM EDT -----  ----- Message ----- From: Pricilla Riffle, MD Sent: 09/17/2020  11:01 PM EDT To: [facility] Triage  CBC is normal  Thyroid function is normal  Urin is concentrated but overall normal Marker of inflammation is normal   Kidney function is normal Electrolytes are OK Urine is a lttle concetrated  I would recomm trying torsemide   Add 2.5 mg ZZaroxylyn 30 min prior    Keep tabs on weight   COme in for BMET in 1 wk

## 2020-09-21 ENCOUNTER — Ambulatory Visit (HOSPITAL_COMMUNITY)
Admission: RE | Admit: 2020-09-21 | Discharge: 2020-09-21 | Disposition: A | Discharge status: Home / Self Care | Payer: Medicare PPO | Source: Ambulatory Visit | Attending: Internal Medicine | Admitting: Internal Medicine

## 2020-09-21 ENCOUNTER — Other Ambulatory Visit: Payer: Self-pay

## 2020-09-21 DIAGNOSIS — I5032 Chronic diastolic (congestive) heart failure: Secondary | ICD-10-CM | POA: Insufficient documentation

## 2020-09-21 DIAGNOSIS — R601 Generalized edema: Secondary | ICD-10-CM

## 2020-09-21 DIAGNOSIS — R140 Abdominal distension (gaseous): Secondary | ICD-10-CM | POA: Insufficient documentation

## 2020-09-21 DIAGNOSIS — I119 Hypertensive heart disease without heart failure: Secondary | ICD-10-CM | POA: Insufficient documentation

## 2020-09-21 DIAGNOSIS — I2510 Atherosclerotic heart disease of native coronary artery without angina pectoris: Secondary | ICD-10-CM | POA: Diagnosis not present

## 2020-09-21 DIAGNOSIS — I252 Old myocardial infarction: Secondary | ICD-10-CM | POA: Diagnosis not present

## 2020-09-21 LAB — ECHOCARDIOGRAM COMPLETE
Area-P 1/2: 3.53 cm2
Calc EF: 64.1 %
S' Lateral: 2.7 cm
Single Plane A2C EF: 59.4 %
Single Plane A4C EF: 67.1 %

## 2020-09-22 ENCOUNTER — Other Ambulatory Visit: Payer: Self-pay | Admitting: Internal Medicine

## 2020-09-25 ENCOUNTER — Other Ambulatory Visit: Payer: Self-pay

## 2020-09-25 ENCOUNTER — Telehealth: Payer: Self-pay | Admitting: Internal Medicine

## 2020-09-25 ENCOUNTER — Other Ambulatory Visit: Payer: Medicare PPO

## 2020-09-25 DIAGNOSIS — I5032 Chronic diastolic (congestive) heart failure: Secondary | ICD-10-CM

## 2020-09-25 DIAGNOSIS — I2510 Atherosclerotic heart disease of native coronary artery without angina pectoris: Secondary | ICD-10-CM

## 2020-09-25 NOTE — Telephone Encounter (Signed)
Follow Up:    She also would like her results from her Ultrasound please.

## 2020-09-25 NOTE — Telephone Encounter (Signed)
Lm to call back ./cy 

## 2020-09-25 NOTE — Telephone Encounter (Signed)
Follow Up:    Patient said she saw her Echo results on My Chart. She said she did not understand it, she needs somebody to explain it to her please.

## 2020-09-26 ENCOUNTER — Telehealth: Payer: Self-pay | Admitting: Student in an Organized Health Care Education/Training Program

## 2020-09-26 LAB — BASIC METABOLIC PANEL
BUN/Creatinine Ratio: 30 — ABNORMAL HIGH (ref 12–28)
BUN: 38 mg/dL — ABNORMAL HIGH (ref 8–27)
CO2: 33 mmol/L — ABNORMAL HIGH (ref 20–29)
Calcium: 9.4 mg/dL (ref 8.7–10.3)
Chloride: 91 mmol/L — ABNORMAL LOW (ref 96–106)
Creatinine, Ser: 1.28 mg/dL — ABNORMAL HIGH (ref 0.57–1.00)
Glucose: 120 mg/dL — ABNORMAL HIGH (ref 65–99)
Potassium: 2.5 mmol/L — CL (ref 3.5–5.2)
Sodium: 142 mmol/L (ref 134–144)
eGFR: 45 mL/min/{1.73_m2} — ABNORMAL LOW (ref >59)

## 2020-09-26 NOTE — Telephone Encounter (Signed)
I was notified about a critical lab result for this patient overnight. Her potassium resulted at 2.5. I called the patient twice but there was no answer. There was no option to leave a voicemail. I notified the day team about this result and sent a message to her PCP regarding this result.

## 2020-09-27 MED ORDER — POTASSIUM CHLORIDE CRYS ER 20 MEQ PO TBCR: 60.0000 meq | EXTENDED_RELEASE_TABLET | Freq: Every day | ORAL | 3 refills | Status: DC

## 2020-09-27 NOTE — Telephone Encounter (Signed)
Follow up:     Patient returning a call back concering results please call  on [phone] her husband phone

## 2020-09-27 NOTE — Telephone Encounter (Signed)
I spoke with the patient.  Neither she nor her husband are receiving messages on their phones.    She has been feeling poorly the last few days.  Little better yesterday.  "I felt like I was at the end." Adv that if she is feeling bad she needs to be seen urgently at the ER so that she can be evaluated.  C/o fatigue, no energy which eased up some yesterday.  She has been made aware of lab results and recommendations.  She has taken 3 potassium tablets today and is aware to take 3 tomorrow am and come tomorrow afternoon for lab work.  Will stop metolazone.    Also informed of ct results.

## 2020-09-28 ENCOUNTER — Other Ambulatory Visit: Payer: Self-pay

## 2020-09-28 ENCOUNTER — Other Ambulatory Visit: Payer: Medicare PPO

## 2020-09-28 DIAGNOSIS — F1121 Opioid dependence, in remission: Secondary | ICD-10-CM | POA: Diagnosis not present

## 2020-09-28 DIAGNOSIS — I5032 Chronic diastolic (congestive) heart failure: Secondary | ICD-10-CM

## 2020-09-28 DIAGNOSIS — I2510 Atherosclerotic heart disease of native coronary artery without angina pectoris: Secondary | ICD-10-CM

## 2020-09-28 DIAGNOSIS — F411 Generalized anxiety disorder: Secondary | ICD-10-CM | POA: Diagnosis not present

## 2020-09-28 DIAGNOSIS — F331 Major depressive disorder, recurrent, moderate: Secondary | ICD-10-CM | POA: Diagnosis not present

## 2020-09-28 DIAGNOSIS — F1320 Sedative, hypnotic or anxiolytic dependence, uncomplicated: Secondary | ICD-10-CM | POA: Diagnosis not present

## 2020-09-29 ENCOUNTER — Other Ambulatory Visit: Payer: Self-pay | Admitting: Internal Medicine

## 2020-09-29 LAB — BASIC METABOLIC PANEL
BUN/Creatinine Ratio: 34 — ABNORMAL HIGH (ref 12–28)
BUN: 37 mg/dL — ABNORMAL HIGH (ref 8–27)
CO2: 33 mmol/L — ABNORMAL HIGH (ref 20–29)
Calcium: 9.7 mg/dL (ref 8.7–10.3)
Chloride: 92 mmol/L — ABNORMAL LOW (ref 96–106)
Creatinine, Ser: 1.09 mg/dL — ABNORMAL HIGH (ref 0.57–1.00)
Glucose: 108 mg/dL — ABNORMAL HIGH (ref 65–99)
Potassium: 3.1 mmol/L — ABNORMAL LOW (ref 3.5–5.2)
Sodium: 139 mmol/L (ref 134–144)
eGFR: 54 mL/min/{1.73_m2} — ABNORMAL LOW (ref >59)

## 2020-09-29 LAB — PRO B NATRIURETIC PEPTIDE: NT-Pro BNP: 262 pg/mL (ref 0–301)

## 2020-10-02 ENCOUNTER — Other Ambulatory Visit (HOSPITAL_COMMUNITY): Payer: Medicare PPO

## 2020-10-02 ENCOUNTER — Telehealth: Payer: Self-pay | Admitting: Internal Medicine

## 2020-10-02 NOTE — Telephone Encounter (Signed)
Patient returning call for lab results. 

## 2020-10-03 ENCOUNTER — Other Ambulatory Visit: Payer: Self-pay | Admitting: *Deleted

## 2020-10-03 ENCOUNTER — Telehealth: Payer: Self-pay | Admitting: Internal Medicine

## 2020-10-03 DIAGNOSIS — E876 Hypokalemia: Secondary | ICD-10-CM

## 2020-10-03 MED ORDER — POTASSIUM CHLORIDE CRYS ER 20 MEQ PO TBCR: 40.0000 meq | EXTENDED_RELEASE_TABLET | Freq: Three times a day (TID) | ORAL | 3 refills | Status: DC

## 2020-10-03 NOTE — Telephone Encounter (Signed)
Per pt was told over the weekend by on call mD to stop Metolazone ,take Torsemide 20 mg 2 tabs every am , and increase KCL to 20 meq 2 tabs tid Will forward to Dr Tenny Craw for review and recommendations Pt will come in tom for repeat BMET ./cy                                       Left VM    Increase K to 2 tabs bid Keep on other meds     Watch Na intake   Limit     Will need BMET on Wed

## 2020-10-03 NOTE — Telephone Encounter (Signed)
Patient called with question/concerning her medication and health. She stated that she called on Friday and no one has yet to return her call. Please advise

## 2020-10-04 ENCOUNTER — Other Ambulatory Visit: Payer: Medicare PPO | Admitting: *Deleted

## 2020-10-04 ENCOUNTER — Other Ambulatory Visit: Payer: Self-pay

## 2020-10-04 DIAGNOSIS — E876 Hypokalemia: Secondary | ICD-10-CM | POA: Diagnosis not present

## 2020-10-05 LAB — BASIC METABOLIC PANEL
BUN/Creatinine Ratio: 21 (ref 12–28)
BUN: 21 mg/dL (ref 8–27)
CO2: 29 mmol/L (ref 20–29)
Calcium: 9.5 mg/dL (ref 8.7–10.3)
Chloride: 100 mmol/L (ref 96–106)
Creatinine, Ser: 0.98 mg/dL (ref 0.57–1.00)
Glucose: 104 mg/dL — ABNORMAL HIGH (ref 65–99)
Potassium: 4 mmol/L (ref 3.5–5.2)
Sodium: 143 mmol/L (ref 134–144)
eGFR: 62 mL/min/{1.73_m2} (ref >59)

## 2020-10-05 NOTE — Telephone Encounter (Signed)
See telephone note 10/03/20.

## 2020-10-06 ENCOUNTER — Other Ambulatory Visit: Payer: Self-pay | Admitting: Family Medicine

## 2020-10-06 DIAGNOSIS — Z1231 Encounter for screening mammogram for malignant neoplasm of breast: Secondary | ICD-10-CM

## 2020-10-09 DIAGNOSIS — I2510 Atherosclerotic heart disease of native coronary artery without angina pectoris: Secondary | ICD-10-CM | POA: Diagnosis not present

## 2020-10-09 DIAGNOSIS — R0902 Hypoxemia: Secondary | ICD-10-CM | POA: Diagnosis not present

## 2020-10-11 ENCOUNTER — Telehealth: Payer: Self-pay | Admitting: Internal Medicine

## 2020-10-11 NOTE — Telephone Encounter (Signed)
Spoke w patient.  She is appreciative for the appointment.  Would prefer the later time (10:40 am) on 10/16/20.

## 2020-10-11 NOTE — Telephone Encounter (Signed)
Patient is returning call to discuss lab results. 

## 2020-10-11 NOTE — Telephone Encounter (Signed)
Spoke w patient.  She is hurrying getting ready to go somewhere and is winded.  She says it is ok if at rest.  Swelling comes and goes.  Right now it is okay.   She said "I want to know, bottom line, do I have time left to live."  She has no new diagnosis and is basing this question on the fact that she has to take the fluid pill and has to stay inside for so long because of this.  She has no energy.  She has no life and she just wants to feel better."  She is tearful on the phone.  [facility] prescribes her lexapro and effexor and she goes there every 2 mos. I asked her to call them to make a sooner appointment.  She reads the results of all her tests and everything points to heart failure and people die from heart failure.   The fluid pills leave her such a small window of time to go out and do anything.    Pt aware I will let Dr. Tenny Craw know how she is feeling and if needed we can bring her into clinic to discuss her treatment plan further or Dr. Tenny Craw may call her.  I reassured her that her labwork is normal and her last echo showed normal heart pumping function.

## 2020-10-11 NOTE — Telephone Encounter (Signed)
Left message for pt to call back.  I spoke w Dr. Tenny Craw.  She will see the patient as add on on 10/16/20, or possibly tomorrow early morning.

## 2020-10-11 NOTE — Telephone Encounter (Signed)
Patient is returning call.  °

## 2020-10-12 DIAGNOSIS — F411 Generalized anxiety disorder: Secondary | ICD-10-CM | POA: Diagnosis not present

## 2020-10-12 DIAGNOSIS — F1121 Opioid dependence, in remission: Secondary | ICD-10-CM | POA: Diagnosis not present

## 2020-10-12 DIAGNOSIS — F332 Major depressive disorder, recurrent severe without psychotic features: Secondary | ICD-10-CM | POA: Diagnosis not present

## 2020-10-16 ENCOUNTER — Ambulatory Visit: Payer: Medicare PPO | Admitting: Internal Medicine

## 2020-10-16 DIAGNOSIS — H5211 Myopia, right eye: Secondary | ICD-10-CM | POA: Diagnosis not present

## 2020-10-16 DIAGNOSIS — H353132 Nonexudative age-related macular degeneration, bilateral, intermediate dry stage: Secondary | ICD-10-CM | POA: Diagnosis not present

## 2020-10-16 DIAGNOSIS — H524 Presbyopia: Secondary | ICD-10-CM | POA: Diagnosis not present

## 2020-10-16 DIAGNOSIS — H52203 Unspecified astigmatism, bilateral: Secondary | ICD-10-CM | POA: Diagnosis not present

## 2020-10-16 DIAGNOSIS — Z961 Presence of intraocular lens: Secondary | ICD-10-CM | POA: Diagnosis not present

## 2020-10-16 DIAGNOSIS — H5202 Hypermetropia, left eye: Secondary | ICD-10-CM | POA: Diagnosis not present

## 2020-10-16 NOTE — Progress Notes (Deleted)
Cardiology Office Note   Date:  10/16/2020   ID:  Meghan Schwartz, Meghan Schwartz [birth year], MRN [id number]  PCP:  Shirlean Mylar, MD  Cardiologist:   Dietrich Pates, MD   F/U of CAD     History of Present Illness:  Meghan Schwartz is a 70 y.o. female with a hx of CAD   She is s/p NSTEMI in 01/2018   Underwent DES to RCA (99 to ) 20% LAD  40% LCx   Also has chronic diastolic CHF, RA and HL   Echo had been done that showed normal LVEF    I saw the pt in Dec 2021  The pt called in on July 12   Said she was SOB Said that her weight was up 25lb in 3 wks   She was on Demedex 20   Reocmm she increase to Demedex 40 mg for 3 day  Call back   Appt set for 7/22  Since that phone call the pt says she did not cut back on Demedex   Continued at 40 mg per day     She continues to have edema     Feels bad   Notes some wheezing     Denies CP     The pt was last in clinic in July 2022     No outpatient medications have been marked as taking for the 10/16/20 encounter (Appointment) with Pricilla Riffle, MD.     Allergies:   Erythromycin   Past Medical History:  Diagnosis Date   Allergic rhinitis    Anxiety    Benzodiazepine dependence (HCC)    Bruxism    CAD (coronary artery disease)    S/p NSTEMI 01/2018 >> LHC: oLAD 20, pLAD 20; oD1 20, oLCx 40, pRCA 99 >> PCI: DES to prox RCA // Echo 01/2018: EF 50-55; prob inf-lat and inf HK   Chronic diastolic heart failure    Echocardiogram 07/2018: EF 55-60, grade 1 diastolic dysfunction, normal wall motion, normal GLS (-22.3), PASP 28   Chronic foot pain    bunions, torn ligaments-on chronic pain medication   CTS (carpal tunnel syndrome) 06/08/2014   Depression    High cholesterol    Hypertension    MDD (major depressive disorder) 10/02/2017   Migraine    Myocardial infarction (HCC) 02/01/2018   Pneumonia 1986   left lung   RA (rheumatoid arthritis) (HCC)     Past Surgical History:  Procedure Laterality Date   BTL  2000   CARPAL TUNNEL RELEASE Left     CESAREAN SECTION  1978. 1983   CORONARY STENT INTERVENTION N/A 01/27/2018   Procedure: CORONARY STENT INTERVENTION;  Surgeon: Kathleene Hazel, MD;  Location: [facility];  Service: Cardiovascular;  Laterality: N/A;   eye implants     FOOT SURGERY Left 2008   front teeth removed after injury     KNEE ARTHROSCOPY WITH LATERAL MENISECTOMY Left 02/02/2019   Procedure: LEFT KNEE ARTHROSCOPY, MEDIAL AND LATERAL MENISECTOMY, EXCISION OF LEFT LATERAL MENISCAL CYST;  Surgeon: Valeria Batman, MD;  Location: [facility];  Service: Orthopedics;  Laterality: Left;   LEFT HEART CATH AND CORONARY ANGIOGRAPHY N/A 01/27/2018   Procedure: LEFT HEART CATH AND CORONARY ANGIOGRAPHY;  Surgeon: Kathleene Hazel, MD;  Location: [facility];  Service: Cardiovascular;  Laterality: N/A;   pre cancerous lesion removed from forehead     TOTAL ABDOMINAL HYSTERECTOMY W/ BILATERAL SALPINGOOPHORECTOMY  2002   Dr. Jackelyn Knife  VAGINAL HYSTERECTOMY  2001     Social History:  The patient  reports that she has been smoking cigarettes. She has never used smokeless tobacco. She reports that she does not drink alcohol and does not use drugs.   Family History:  The patient's family history includes Arthritis in her maternal grandmother; Breast cancer in her mother; Heart attack in her maternal grandmother and paternal grandfather; Parkinson's disease in her father; Raynaud syndrome in her maternal aunt; Stroke in her paternal grandmother; Thyroid disease in her mother.    ROS:  Please see the history of present illness. All other systems are reviewed and  Negative to the above problem except as noted.    PHYSICAL EXAM: VS:  There were no vitals taken for this visit.  Sats 92% at rest and with walking   GEN: Well nourished, well developed, in no acute distress  HEENT:  Periorbital edema Neck: JVP is increase  Cardiac: RRR; no murmurs;   2 to 3+  edema  Respiratory:  clear to auscultation bilaterally,   GI: soft, nontender   Sl distended    MS: no deformity Moving all extremities   Skin: warm and dry, no rash Neuro: no gross defects  Psych: euthymic mood, full affect   EKG:  EKG is not ordered today.   Echo:  Jan 2021  Left ventricular ejection fraction, by visual estimation, is 60 to 65%. The left ventricle has normal function. There is no left ventricular hypertrophy. 2. The left ventricle has no regional wall motion abnormalities. 3. Global right ventricle has normal systolic function.The right ventricular size is normal. No increase in right ventricular wall thickness. 4. Left atrial size was normal. 5. Right atrial size was normal. 6. The mitral valve is normal in structure. Trivial mitral valve regurgitation. No evidence of mitral stenosis. 7. The tricuspid valve is normal in structure. 8. The aortic valve is tricuspid. Aortic valve regurgitation is not visualized. Mild aortic valve sclerosis without stenosis. 9. The pulmonic valve was not well visualized. Pulmonic valve regurgitation is not visualized. 10. The inferior vena cava is normal in size with greater than 50% respiratory variability, suggesting right atrial pressure of 3 mmHg. 11. Normal LV function.  Lipid Panel    Component Value Date/Time   CHOL 171 02/04/2020 1652   TRIG 88 02/04/2020 1652   HDL 67 02/04/2020 1652   CHOLHDL 2.6 02/04/2020 1652   CHOLHDL 2.7 01/28/2018 0353   VLDL 16 01/28/2018 0353   LDLCALC 88 02/04/2020 1652      Wt Readings from Last 3 Encounters:  09/15/20 173 lb 12.8 oz (78.8 kg)  06/15/20 155 lb (70.3 kg)  02/04/20 155 lb 6.4 oz (70.5 kg)      ASSESSMENT AND PLAN:  1  Edema  Pt with hx of diastolic CHF   But current volume increase apperas more like anasarca   Periorbital edema present   marked volume increase    Note albumen was low in past   ? If lowerwith dereaed oncotic pressure    She did not respond to increas in torsemide    Will get CMET , BNP, TSH   COnsider  abdominal/pelvic CT to r/o some obstructive  COnsider repat echo   Discussed possible admit if cannot improve   There are no beds available today   As she is not hypoxic will try to eval as outpt  2 CAD  Last cardiac cath in 2019  Pt had 99% p RCA   Underwent  PCI/DES of prox RCA   Mild dz elsewhere   >VEF 50 to 55% with inferior / inferolateral hypokinesis.   No symtpoms of CP       3  HL    Continue Crestor     4  Hx RA   Continue on Enbrel and Plaquenil  ? If     F/U in clinic in 8 months      Current medicines are reviewed at length with the patient today.  The patient does not have concerns regarding medicines.  Signed, Dietrich Pates, MD  10/16/2020 7:03 AM    [facility] [address] [place], [place], [place]  [address] Phone: [phone]; Fax: [phone]

## 2020-10-20 DIAGNOSIS — L578 Other skin changes due to chronic exposure to nonionizing radiation: Secondary | ICD-10-CM | POA: Diagnosis not present

## 2020-10-20 DIAGNOSIS — L821 Other seborrheic keratosis: Secondary | ICD-10-CM | POA: Diagnosis not present

## 2020-10-20 DIAGNOSIS — I872 Venous insufficiency (chronic) (peripheral): Secondary | ICD-10-CM | POA: Diagnosis not present

## 2020-10-23 NOTE — Telephone Encounter (Addendum)
Patient is rescheduled for 9/22 with APP. Will watch for sooner with PR and move up if possible.

## 2020-10-29 ENCOUNTER — Other Ambulatory Visit: Payer: Self-pay | Admitting: Internal Medicine

## 2020-11-03 DIAGNOSIS — I5032 Chronic diastolic (congestive) heart failure: Secondary | ICD-10-CM | POA: Diagnosis not present

## 2020-11-06 DIAGNOSIS — H43813 Vitreous degeneration, bilateral: Secondary | ICD-10-CM | POA: Diagnosis not present

## 2020-11-06 DIAGNOSIS — Z79899 Other long term (current) drug therapy: Secondary | ICD-10-CM | POA: Diagnosis not present

## 2020-11-06 DIAGNOSIS — H43393 Other vitreous opacities, bilateral: Secondary | ICD-10-CM | POA: Diagnosis not present

## 2020-11-06 DIAGNOSIS — H353231 Exudative age-related macular degeneration, bilateral, with active choroidal neovascularization: Secondary | ICD-10-CM | POA: Diagnosis not present

## 2020-11-07 ENCOUNTER — Encounter: Payer: Self-pay | Admitting: Orthopaedic Surgery

## 2020-11-07 ENCOUNTER — Other Ambulatory Visit: Payer: Self-pay

## 2020-11-07 ENCOUNTER — Ambulatory Visit: Payer: Medicare PPO | Admitting: Orthopaedic Surgery

## 2020-11-07 DIAGNOSIS — M1712 Unilateral primary osteoarthritis, left knee: Secondary | ICD-10-CM

## 2020-11-07 MED ORDER — BUPIVACAINE HCL 0.25 % IJ SOLN
2.0000 mL | INTRAMUSCULAR | Status: AC | PRN
  Administered 2020-11-07: 2 mL via INTRA_ARTICULAR

## 2020-11-07 MED ORDER — METHYLPREDNISOLONE ACETATE 40 MG/ML IJ SUSP
80.0000 mg | INTRAMUSCULAR | Status: AC | PRN
  Administered 2020-11-07: 80 mg via INTRA_ARTICULAR

## 2020-11-07 MED ORDER — LIDOCAINE HCL 1 % IJ SOLN
2.0000 mL | INTRAMUSCULAR | Status: AC | PRN
  Administered 2020-11-07: 2 mL

## 2020-11-07 NOTE — Progress Notes (Signed)
Office Visit Note   Patient: Meghan Schwartz           Date of Birth: 18-Nov-1950           MRN: [id number] Visit Date: 11/07/2020              Requested by: Shirlean Mylar, MD [address] [place],  [place] [address] PCP: Shirlean Mylar, MD   Assessment & Plan: Visit Diagnoses:  1. Primary osteoarthritis of left knee     Plan: Recurrent symptoms of osteoarthritis left knee predominantly in the lateral compartment.  Will inject with cortisone.  We checked with her ophthalmologist who related it would not be a problem injecting her knee today as she is having some procedure tomorrow  Follow-Up Instructions: Return if symptoms worsen or fail to improve.   Orders:  No orders of the defined types were placed in this encounter.  No orders of the defined types were placed in this encounter.     Procedures: Large Joint Inj: L knee on 11/07/2020 3:37 PM Indications: pain and diagnostic evaluation Details: 25 G 1.5 in needle, anterolateral approach  Arthrogram: No  Medications: 2 mL lidocaine 1 %; 80 mg methylPREDNISolone acetate 40 MG/ML; 2 mL bupivacaine 0.25 % Procedure, treatment alternatives, risks and benefits explained, specific risks discussed. Consent was given by the patient. Patient was prepped and draped in the usual sterile fashion.      Clinical Data: No additional findings.   Subjective: Chief Complaint  Patient presents with   Left Knee - Pain  Patient presents today for left knee pain. She states that her knee started to hurt again in August of this year. She received a cortisone injection in April. She states that she has now developed a Bakers Cyst and it is painful. She is wanting to get another cortisone injection today.   HPI  Review of Systems   Objective: Vital Signs: There were no vitals taken for this visit.  Physical Exam Constitutional:      Appearance: She is well-developed.  Pulmonary:     Effort: Pulmonary effort is normal.   Skin:    General: Skin is warm and dry.  Neurological:     Mental Status: She is alert and oriented to person, place, and time.  Psychiatric:        Behavior: Behavior normal.    Ortho Exam predominantly lateral joint pain left knee.  Full extension flexed about 90 degrees.  No popping or clicking.  No patella pain.  No evidence of any instability.  There is some mild popliteal fullness consistent with a popliteal cyst.  There is some induration of the lower extremity that is chronic  Specialty Comments:  No specialty comments available.  Imaging: No results found.   PMFS History: Patient Active Problem List   Diagnosis Date Noted   Bunion of great toe of right foot 06/15/2020   Hypoxia 03/05/2019   Derangement of posterior horn of medial meniscus 02/02/2019   Derangement of posterior horn of lateral meniscus 02/02/2019   Meniscal cyst, unspecified laterality 12/01/2018   Torn medial meniscus 12/01/2018   Primary osteoarthritis of left knee 12/01/2018   Coronary artery disease involving native coronary artery of native heart without angina pectoris 09/14/2018   Chronic diastolic (congestive) heart failure (HCC)    Chronic pain of left knee 05/15/2018   Mass of left knee 05/15/2018   Non-ST elevation (NSTEMI) myocardial infarction [facility])    Chest pain 01/25/2018  RA (rheumatoid arthritis) (HCC) 01/25/2018   Hyperlipidemia 01/25/2018   Hypokalemia 01/25/2018   Depression with anxiety 01/25/2018   Tobacco abuse 01/25/2018   Lower extremity cellulitis 01/25/2018   Benzodiazepine dependence (HCC)    MDD (major depressive disorder), recurrent severe, without psychosis (HCC) 10/02/2017   CTS (carpal tunnel syndrome) 06/08/2014   Past Medical History:  Diagnosis Date   Allergic rhinitis    Anxiety    Benzodiazepine dependence (HCC)    Bruxism    CAD (coronary artery disease)    S/p NSTEMI 01/2018 >> LHC: oLAD 20, pLAD 20; oD1 20, oLCx 40, pRCA 99 >> PCI: DES to prox RCA //  Echo 01/2018: EF 50-55; prob inf-lat and inf HK   Chronic diastolic heart failure    Echocardiogram 07/2018: EF 55-60, grade 1 diastolic dysfunction, normal wall motion, normal GLS (-22.3), PASP 28   Chronic foot pain    bunions, torn ligaments-on chronic pain medication   CTS (carpal tunnel syndrome) 06/08/2014   Depression    High cholesterol    Hypertension    MDD (major depressive disorder) 10/02/2017   Migraine    Myocardial infarction (HCC) 02/01/2018   Pneumonia 1986   left lung   RA (rheumatoid arthritis) (HCC)     Family History  Problem Relation Age of Onset   Thyroid disease Mother    Breast cancer Mother    Parkinson's disease Father    Heart attack Paternal Grandfather    Stroke Paternal Grandmother    Arthritis Maternal Grandmother    Heart attack Maternal Grandmother    Raynaud syndrome Maternal Aunt     Past Surgical History:  Procedure Laterality Date   BTL  2000   CARPAL TUNNEL RELEASE Left    CESAREAN SECTION  1978. 1983   CORONARY STENT INTERVENTION N/A 01/27/2018   Procedure: CORONARY STENT INTERVENTION;  Surgeon: Kathleene Hazel, MD;  Location: [facility];  Service: Cardiovascular;  Laterality: N/A;   eye implants     FOOT SURGERY Left 2008   front teeth removed after injury     KNEE ARTHROSCOPY WITH LATERAL MENISECTOMY Left 02/02/2019   Procedure: LEFT KNEE ARTHROSCOPY, MEDIAL AND LATERAL MENISECTOMY, EXCISION OF LEFT LATERAL MENISCAL CYST;  Surgeon: Valeria Batman, MD;  Location: [facility];  Service: Orthopedics;  Laterality: Left;   LEFT HEART CATH AND CORONARY ANGIOGRAPHY N/A 01/27/2018   Procedure: LEFT HEART CATH AND CORONARY ANGIOGRAPHY;  Surgeon: Kathleene Hazel, MD;  Location: [facility];  Service: Cardiovascular;  Laterality: N/A;   pre cancerous lesion removed from forehead     TOTAL ABDOMINAL HYSTERECTOMY W/ BILATERAL SALPINGOOPHORECTOMY  2002   Dr. Jackelyn Knife   VAGINAL HYSTERECTOMY  2001   Social History    Occupational History   Occupation: Retired   Tobacco Use   Smoking status: Some Days    Years: 20.00    Types: Cigarettes    Last attempt to quit: 02/25/2010    Years since quitting: 10.7   Smokeless tobacco: Never   Tobacco comments:    Restarted smoking earlier this year. smokes during stressful time   Vaping Use   Vaping Use: Never used  Substance and Sexual Activity   Alcohol use: No    Alcohol/week: 0.0 standard drinks   Drug use: No   Sexual activity: Not on file

## 2020-11-08 DIAGNOSIS — H353221 Exudative age-related macular degeneration, left eye, with active choroidal neovascularization: Secondary | ICD-10-CM | POA: Diagnosis not present

## 2020-11-09 ENCOUNTER — Other Ambulatory Visit: Payer: Self-pay

## 2020-11-09 ENCOUNTER — Telehealth: Payer: Self-pay | Admitting: Orthopaedic Surgery

## 2020-11-09 DIAGNOSIS — G8929 Other chronic pain: Secondary | ICD-10-CM

## 2020-11-09 NOTE — Telephone Encounter (Signed)
Pt called stating that Dr. Cleophas Dunker would like her to see Dr. Lexine Baton. Dershwar is asking for a referral. Please fax referral. Pt phone number is [phone].

## 2020-11-09 NOTE — Telephone Encounter (Signed)
Called and notified patient that referral has been entered.  

## 2020-11-13 ENCOUNTER — Telehealth: Payer: Self-pay | Admitting: Orthopaedic Surgery

## 2020-11-13 NOTE — Telephone Encounter (Signed)
Tried to call patient. No answer.

## 2020-11-13 NOTE — Telephone Encounter (Signed)
Patient called advised the referral was for Dr Dimple Casey. Patient said she thought she was to see Dr Corliss Skains. Patient wanted to know which provider was she being referred to?  The number to contact patient is [phone]

## 2020-11-14 ENCOUNTER — Telehealth: Payer: Self-pay | Admitting: Orthopaedic Surgery

## 2020-11-14 DIAGNOSIS — Z23 Encounter for immunization: Secondary | ICD-10-CM | POA: Diagnosis not present

## 2020-11-14 NOTE — Telephone Encounter (Signed)
Pt called asking about her rheumatology referral.    CB [phone]

## 2020-11-14 NOTE — Telephone Encounter (Signed)
Spoke with patient. See other phone note.

## 2020-11-14 NOTE — Telephone Encounter (Signed)
Called and spoke with patient. Let her know that the referral has been sent. She needs to contact Dr.Hawks office and have her records sent to [facility] for them to review.

## 2020-11-16 ENCOUNTER — Ambulatory Visit (HOSPITAL_BASED_OUTPATIENT_CLINIC_OR_DEPARTMENT_OTHER): Payer: Medicare PPO | Admitting: Family

## 2020-11-24 ENCOUNTER — Ambulatory Visit
Admission: RE | Admit: 2020-11-24 | Discharge: 2020-11-24 | Disposition: A | Discharge status: Home / Self Care | Payer: Medicare PPO | Source: Ambulatory Visit | Attending: Family Medicine | Admitting: Family Medicine

## 2020-11-24 ENCOUNTER — Other Ambulatory Visit: Payer: Self-pay

## 2020-11-24 DIAGNOSIS — Z1231 Encounter for screening mammogram for malignant neoplasm of breast: Secondary | ICD-10-CM

## 2020-12-03 DIAGNOSIS — I5032 Chronic diastolic (congestive) heart failure: Secondary | ICD-10-CM | POA: Diagnosis not present

## 2020-12-06 DIAGNOSIS — H353231 Exudative age-related macular degeneration, bilateral, with active choroidal neovascularization: Secondary | ICD-10-CM | POA: Diagnosis not present

## 2020-12-06 DIAGNOSIS — H353221 Exudative age-related macular degeneration, left eye, with active choroidal neovascularization: Secondary | ICD-10-CM | POA: Diagnosis not present

## 2020-12-07 DIAGNOSIS — F332 Major depressive disorder, recurrent severe without psychotic features: Secondary | ICD-10-CM | POA: Diagnosis not present

## 2020-12-07 DIAGNOSIS — F1121 Opioid dependence, in remission: Secondary | ICD-10-CM | POA: Diagnosis not present

## 2020-12-07 DIAGNOSIS — F1320 Sedative, hypnotic or anxiolytic dependence, uncomplicated: Secondary | ICD-10-CM | POA: Diagnosis not present

## 2020-12-07 DIAGNOSIS — F411 Generalized anxiety disorder: Secondary | ICD-10-CM | POA: Diagnosis not present

## 2020-12-25 ENCOUNTER — Other Ambulatory Visit: Payer: Self-pay | Admitting: Internal Medicine

## 2021-01-10 DIAGNOSIS — H43393 Other vitreous opacities, bilateral: Secondary | ICD-10-CM | POA: Diagnosis not present

## 2021-01-10 DIAGNOSIS — H353221 Exudative age-related macular degeneration, left eye, with active choroidal neovascularization: Secondary | ICD-10-CM | POA: Diagnosis not present

## 2021-01-10 DIAGNOSIS — H353231 Exudative age-related macular degeneration, bilateral, with active choroidal neovascularization: Secondary | ICD-10-CM | POA: Diagnosis not present

## 2021-01-10 DIAGNOSIS — Z79899 Other long term (current) drug therapy: Secondary | ICD-10-CM | POA: Diagnosis not present

## 2021-01-10 DIAGNOSIS — H43813 Vitreous degeneration, bilateral: Secondary | ICD-10-CM | POA: Diagnosis not present

## 2021-01-15 ENCOUNTER — Other Ambulatory Visit: Payer: Self-pay | Admitting: Internal Medicine

## 2021-01-31 ENCOUNTER — Other Ambulatory Visit: Payer: Self-pay

## 2021-01-31 ENCOUNTER — Ambulatory Visit: Payer: Medicare PPO | Admitting: Orthopaedic Surgery

## 2021-01-31 ENCOUNTER — Ambulatory Visit (INDEPENDENT_AMBULATORY_CARE_PROVIDER_SITE_OTHER): Payer: Medicare PPO

## 2021-01-31 ENCOUNTER — Encounter: Payer: Self-pay | Admitting: Orthopaedic Surgery

## 2021-01-31 DIAGNOSIS — M79672 Pain in left foot: Secondary | ICD-10-CM

## 2021-01-31 NOTE — Progress Notes (Signed)
Office Visit Note   Patient: Meghan Schwartz           Date of Birth: [birth year]-Meghan Schwartz-1952           MRN: [id number] Visit Date: 01/31/2021              Requested by: No referring provider defined for this encounter. PCP: Meghan Small, MD (Inactive)   Assessment & Plan: Visit Diagnoses:  1. Pain in left foot     Plan: Meghan Schwartz has a chronic history of bilateral foot pain.  She also has an established diagnosis of rheumatoid arthritis.  I had referred her to Meghan Schwartz and Meghan Schwartz for evaluation of her right foot.  He made several suggestions regarding surgical correction but she has been somewhat hesitant to proceed.  She is wearing good comfortable shoes.  She recently has developed some pain in her left foot.  She has had prior surgery by Meghan Schwartz.  Exam demonstrates a thickened callus at the very tip of the fourth toe that seems to be the cause of her pain.  I pared this with a 15 blade knife with relief of her pain.  There were no acute changes or evidence of a fracture.  She will continue with a good comfortable shoes.  She might want to wear a Band-Aid at the tip of the toe for a few days just to use that as a pad there was no bleeding.  Return as needed  Follow-Up Instructions: Return if symptoms worsen or fail to improve.   Orders:  Orders Placed This Encounter  Procedures   XR Foot Complete Left   No orders of the defined types were placed in this encounter.     Procedures: No procedures performed   Clinical Data: No additional findings.   Subjective: Chief Complaint  Patient presents with   Right Foot - Pain   Left Foot - Pain  Patient presents today for bilateral foot pain. She said that her left fourth toe is painful to bear weight on. She said that it has been hurting for 3-62months. No known injury. She said that she has a history of foot reconstruction with Meghan Schwartz. She also has pain in her right foot fourth toe. She said that there is a corn on that right fourth toe that  is causing her a lot of pain.   HPI  Review of Systems   Objective: Vital Signs: There were no vitals taken for this visit.  Physical Exam Constitutional:      Appearance: She is well-developed.  Pulmonary:     Effort: Pulmonary effort is normal.  Skin:    General: Skin is warm and dry.  Neurological:     Mental Status: She is alert and oriented to person, place, and time.  Psychiatric:        Behavior: Behavior normal.    Ortho Exam awake alert and oriented x3.  Comfortable sitting.  Has a slow based gait.  I evaluate the left foot with prior surgical scars that are well-healed dorsally at the great second third and fourth toes.  There is no ulnar drift of the toes but no significant pain other than over a thickened callus at the tip of the fourth toe.  Other than the toe drift there was no deformity of the lesser toes and no discomfort over any of the metatarsal heads.  Good capillary fill  Specialty Comments:  No specialty comments available.  Imaging: XR Foot Complete Left  Result  Date: 01/31/2021 Films of the left knee were obtained in several projections.  There is been prior surgery on the second and third metatarsal head with osteotomies and screw fixation and a bunion procedure of the great metatarsal head with screw fixation.  Screw position is fine.  No significant degenerative changes at the metatarsal phalangeal joints.  There are some degenerative changes of the IP joints of the toes.  Patient has a history of rheumatoid arthritis.  There is lateral drift of all the toes including the great.  No acute changes    PMFS History: Patient Active Problem List   Diagnosis Date Noted   Pain in left foot 01/31/2021   Bunion of great toe of right foot 06/15/2020   Hypoxia 03/05/2019   Derangement of posterior horn of medial meniscus 02/02/2019   Derangement of posterior horn of lateral meniscus 02/02/2019   Meniscal cyst, unspecified laterality 12/01/2018   Torn medial  meniscus 12/01/2018   Primary osteoarthritis of left knee 12/01/2018   Coronary artery disease involving native coronary artery of native heart without angina pectoris 09/14/2018   Chronic diastolic (congestive) heart failure (HCC)    Chronic pain of left knee 05/15/2018   Mass of left knee 05/15/2018   Non-ST elevation (NSTEMI) myocardial infarction [facility])    Chest pain 01/25/2018   RA (rheumatoid arthritis) (HCC) 01/25/2018   Hyperlipidemia 01/25/2018   Hypokalemia 01/25/2018   Depression with anxiety 01/25/2018   Tobacco abuse 01/25/2018   Lower extremity cellulitis 01/25/2018   Benzodiazepine dependence (HCC)    MDD (major depressive disorder), recurrent severe, without psychosis (HCC) 10/02/2017   CTS (carpal tunnel syndrome) 06/08/2014   Past Medical History:  Diagnosis Date   Allergic rhinitis    Anxiety    Benzodiazepine dependence (HCC)    Bruxism    CAD (coronary artery disease)    S/p NSTEMI 01/2018 >> LHC: oLAD 20, pLAD 20; oD1 20, oLCx 40, pRCA 99 >> PCI: DES to prox RCA // Echo 01/2018: EF 50-55; prob inf-lat and inf HK   Chronic diastolic heart failure    Echocardiogram 07/2018: EF 55-60, grade 1 diastolic dysfunction, normal wall motion, normal GLS (-22.3), PASP 28   Chronic foot pain    bunions, torn ligaments-on chronic pain medication   CTS (carpal tunnel syndrome) 06/08/2014   Depression    High cholesterol    Hypertension    MDD (major depressive disorder) 10/02/2017   Migraine    Myocardial infarction (HCC) 02/01/2018   Pneumonia 1986   left lung   RA (rheumatoid arthritis) (HCC)     Family History  Problem Relation Age of Onset   Thyroid disease Mother    Breast cancer Mother    Parkinson's disease Father    Heart attack Paternal Grandfather    Stroke Paternal Grandmother    Arthritis Maternal Grandmother    Heart attack Maternal Grandmother    Raynaud syndrome Maternal Aunt     Past Surgical History:  Procedure Laterality Date   BTL  2000    CARPAL TUNNEL RELEASE Left    CESAREAN SECTION  1978. 1983   CORONARY STENT INTERVENTION N/A 01/27/2018   Procedure: CORONARY STENT INTERVENTION;  Surgeon: Kathleene Hazel, MD;  Location: [facility];  Service: Cardiovascular;  Laterality: N/A;   eye implants     FOOT SURGERY Left 2008   front teeth removed after injury     KNEE ARTHROSCOPY WITH LATERAL MENISECTOMY Left 02/02/2019   Procedure: LEFT KNEE ARTHROSCOPY, MEDIAL  AND LATERAL MENISECTOMY, EXCISION OF LEFT LATERAL MENISCAL CYST;  Surgeon: Garald Balding, MD;  Location: [facility];  Service: Orthopedics;  Laterality: Left;   LEFT HEART CATH AND CORONARY ANGIOGRAPHY N/A 01/27/2018   Procedure: LEFT HEART CATH AND CORONARY ANGIOGRAPHY;  Surgeon: Burnell Blanks, MD;  Location: [facility];  Service: Cardiovascular;  Laterality: N/A;   pre cancerous lesion removed from forehead     TOTAL ABDOMINAL HYSTERECTOMY W/ BILATERAL SALPINGOOPHORECTOMY  2002   Dr. Willis Modena   VAGINAL HYSTERECTOMY  2001   Social History   Occupational History   Occupation: Retired   Tobacco Use   Smoking status: Some Days    Years: 20.00    Types: Cigarettes    Last attempt to quit: 02/25/2010    Years since quitting: 10.9   Smokeless tobacco: Never   Tobacco comments:    Restarted smoking earlier this year. smokes during stressful time   Vaping Use   Vaping Use: Never used  Substance and Sexual Activity   Alcohol use: No    Alcohol/week: 0.0 standard drinks   Drug use: No   Sexual activity: Not on file

## 2021-02-01 DIAGNOSIS — F332 Major depressive disorder, recurrent severe without psychotic features: Secondary | ICD-10-CM | POA: Diagnosis not present

## 2021-02-01 DIAGNOSIS — F1121 Opioid dependence, in remission: Secondary | ICD-10-CM | POA: Diagnosis not present

## 2021-02-01 DIAGNOSIS — F411 Generalized anxiety disorder: Secondary | ICD-10-CM | POA: Diagnosis not present

## 2021-02-01 DIAGNOSIS — F1320 Sedative, hypnotic or anxiolytic dependence, uncomplicated: Secondary | ICD-10-CM | POA: Diagnosis not present

## 2021-02-04 NOTE — Progress Notes (Signed)
Office Visit Note  Patient: Meghan Schwartz             Date of Birth: 1950-04-27           MRN: [id number]             PCP: Maurice Small, MD (Inactive) Referring: Garald Balding, MD Visit Date: 02/05/2021 Occupation: Retired Pharmacist, hospital  Subjective:  New Patient (Initial Visit) (Transferring care)   History of Present Illness: Meghan Schwartz is a 70 y.o. female her for rheumatoid arthritis on Enbrel 50 mg [place] weekly for which she has been seeing Dr. Trudie Reed previously. She was originally diagnosed about 10 or 11 years ago due to development of joint pain, stiffness, synovitis, and progressive joint deformities primarily starting in the feet and knees.  Subsequently proceeded to have increased joint pains involving bilateral hands with some swelling at the PIP and MCP joints.  She started treatment with Enbrel injections with some improvement in the lower extremity joint pains she recalls still having a lot of pain symptoms and pronounced fatigue that did not clear with this treatment.  Combination treatment was tried with methotrexate with significant GI intolerance.  She tried leflunomide but developed substantial alopecia so discontinued the medicine.  She switched to hydroxychloroquine combination treatment which was being well-tolerated but she has had multiple new or worsening medical problems over the past few years with concern of medication related effects. She had hand numbness in the past improved after left carpal tunnel release surgery but she has some residual muscle atrophy in thenar prominence. She had left knee meniscectomy in 2020. She suffered NSTEMI in 01/2018 with DES placement in RCA for 99% blockage and has chronic diastolic CHF. Her edema is generally controlled while taking torsemide but does rapidly re accumulate fluid off this medication. Ophthalmology evaluation indicated significant visual field deficits findings consistent with age-related macular degeneration of both eyes  and recommended avoidance of this medication.  She also has considerable osteoarthritis especially in her knees and feet previous surgical reconstruction on the left foot. Overall she feels her gait is not very stable due to these problems.  DMARD Hx Enbrel - current  HCQ - macular disease? LEF - alopecia MTX - GI intolerance.    Labs reviewed 09/2020 BMP wnl  08/2020 ESR 28 TSH wnl  06/2020 CBC unremarkable CMP wnl TB neg HAV neg HBV neg HCV neg  Imaging reviewed 06/11/2018 MRI left knee IMPRESSION: 1. 4.0 x 2.7 x 6.7 cm complex, heterogeneous cystic lesion along the lateral knee, contacting the lateral meniscus body. Given underlying suspected lateral meniscus horizontal tear, this could represent a large parameniscal cyst. The differential diagnosis also includes a complex ganglion/synovial cyst, and less likely LCL bursitis. 2. Longitudinal tear of the medial meniscus posterior horn. Possible additional longitudinal tear of the body. 3. Moderate tricompartmental osteoarthritis. 4. Moderate joint effusion with prominent synovitis likely related to patient's underlying rheumatoid arthritis. No erosive changes.  03/05/19 CTA Chest IMPRESSION:  1. No evidence of acute aortic dissection, aneurysm or other acute arterial abnormality. 2. Asymmetric atelectasis versus small volume infiltrate in the posterior right lower lobe. This could represent early developing pneumonia, or small volume aspiration if the patient suffers from gastroesophageal reflux disease. 3. Mild scattered atherosclerotic plaque along the thoracic and abdominal aorta. Aortic Atherosclerosis (ICD10-170.0). 4. Coronary artery calcifications.  Activities of Daily Living:  Patient reports morning stiffness for 45 minutes.   Patient Reports nocturnal pain.  Difficulty dressing/grooming: Reports Difficulty climbing stairs:  Reports Difficulty getting out of chair: Reports Difficulty using hands for taps, buttons,  cutlery, and/or writing: Reports  Review of Systems  Constitutional:  Positive for fatigue.  HENT:  Positive for mouth dryness.   Eyes:  Positive for dryness.  Respiratory:  Positive for shortness of breath.   Cardiovascular:  Positive for swelling in legs/feet.  Gastrointestinal:  Positive for constipation.  Endocrine: Positive for cold intolerance, heat intolerance, excessive thirst and increased urination.  Genitourinary:  Negative for difficulty urinating.  Musculoskeletal:  Positive for joint pain, gait problem, joint pain, joint swelling, muscle weakness, morning stiffness and muscle tenderness.  Skin:  Negative for rash.  Allergic/Immunologic: Negative for susceptible to infections.  Neurological:  Positive for weakness.  Hematological:  Positive for bruising/bleeding tendency.  Psychiatric/Behavioral:  Negative for sleep disturbance.    PMFS History:  Patient Active Problem List   Diagnosis Date Noted   High risk medication use 02/05/2021   Dry mouth 02/05/2021   Pain in left foot 01/31/2021   Bunion of great toe of right foot 06/15/2020   Hypoxia 03/05/2019   Derangement of posterior horn of medial meniscus 02/02/2019   Derangement of posterior horn of lateral meniscus 02/02/2019   Meniscal cyst, unspecified laterality 12/01/2018   Torn medial meniscus 12/01/2018   Primary osteoarthritis of left knee 12/01/2018   Coronary artery disease involving native coronary artery of native heart without angina pectoris 09/14/2018   Chronic diastolic (congestive) heart failure (HCC)    Chronic pain of left knee 05/15/2018   Mass of left knee 05/15/2018   Non-ST elevation (NSTEMI) myocardial infarction [place])    Chest pain 01/25/2018   RA (rheumatoid arthritis) ([place]) 01/25/2018   Hyperlipidemia 01/25/2018   Hypokalemia 01/25/2018   Depression with anxiety 01/25/2018   Tobacco abuse 01/25/2018   Lower extremity cellulitis 01/25/2018   Benzodiazepine dependence ([place])    MDD  (major depressive disorder), recurrent severe, without psychosis ([place]) 10/02/2017   CTS (carpal tunnel syndrome) 06/08/2014    Past Medical History:  Diagnosis Date   Allergic rhinitis    Anxiety    Benzodiazepine dependence (HCC)    Bruxism    CAD (coronary artery disease)    S/p NSTEMI 01/2018 >> LHC: oLAD 37, pLAD 12; oD1 20, oLCx 40, pRCA 99 >> PCI: DES to prox RCA // Echo 01/2018: EF 50-55; prob inf-lat and inf HK   Chronic diastolic heart failure    Echocardiogram 07/2018: EF 27-03, grade 1 diastolic dysfunction, normal wall motion, normal GLS (-22.3), PASP 28   Chronic foot pain    bunions, torn ligaments-on chronic pain medication   CTS (carpal tunnel syndrome) 06/08/2014   Depression    High cholesterol    Hypertension    MDD (major depressive disorder) 10/02/2017   Migraine    Myocardial infarction ([place]) 02/01/2018   Pneumonia 1986   left lung   RA (rheumatoid arthritis) ([place])     Family History  Problem Relation Age of Onset   Thyroid disease Mother    Breast cancer Mother    Parkinson's disease Father    Raynaud syndrome Maternal Aunt    Arthritis Maternal Grandmother    Heart attack Maternal Grandmother    Stroke Paternal Grandmother    Heart attack Paternal Grandfather    Past Surgical History:  Procedure Laterality Date   BTL  2000   CARPAL TUNNEL RELEASE Left    CESAREAN [place] N/A 01/27/2018   Procedure: CORONARY STENT  INTERVENTION;  Surgeon: Burnell Blanks, MD;  Location: [facility];  Service: Cardiovascular;  Laterality: N/A;   eye implants     FOOT SURGERY Left 2008   front teeth removed after injury     KNEE ARTHROSCOPY WITH LATERAL MENISECTOMY Left 02/02/2019   Procedure: LEFT KNEE ARTHROSCOPY, MEDIAL AND LATERAL MENISECTOMY, EXCISION OF LEFT LATERAL MENISCAL CYST;  Surgeon: Garald Balding, MD;  Location: [facility];  Service: Orthopedics;  Laterality: Left;   LEFT HEART CATH AND CORONARY  ANGIOGRAPHY N/A 01/27/2018   Procedure: LEFT HEART CATH AND CORONARY ANGIOGRAPHY;  Surgeon: Burnell Blanks, MD;  Location: [facility];  Service: Cardiovascular;  Laterality: N/A;   pre cancerous lesion removed from forehead     TOTAL ABDOMINAL HYSTERECTOMY W/ BILATERAL SALPINGOOPHORECTOMY  2002   Dr. Willis Modena   VAGINAL HYSTERECTOMY  2001   Social History   Social History Narrative   Lives at home with spouse.    Right handed.   Caffeine use: 2 cups coffee/day    8 oz soda/day    Immunization History  Administered Date(s) Administered   PFIZER(Purple Top)SARS-COV-2 Vaccination 04/10/2019, 05/05/2019, 10/19/2019, 06/20/2020   Pfizer Covid-19 Vaccine Bivalent Booster 33yr & up 11/29/2020     Objective: Vital Signs: BP 124/69 (BP Location: Right Arm, Patient Position: Sitting, Cuff Size: Normal)   Pulse 66   Resp 17   Ht 5' 2.75" (1.594 m)   Wt 159 lb (72.1 kg)   BMI 28.39 kg/m    Physical Exam HENT:     Right Ear: External ear normal.     Left Ear: External ear normal.     Mouth/Throat:     Mouth: Mucous membranes are dry.  Cardiovascular:     Rate and Rhythm: Normal rate and regular rhythm.  Pulmonary:     Effort: Pulmonary effort is normal.     Breath sounds: Normal breath sounds.  Skin:    General: Skin is warm and dry.     Findings: No rash.     Comments: Mild hyperpigmentation and skin thickening in distal legs b/l without current pitting edema  Neurological:     Mental Status: She is alert.     Deep Tendon Reflexes: Reflexes normal.  Psychiatric:        Mood and Affect: Mood normal.     Musculoskeletal Exam:  Neck full ROM no tenderness Shoulders slightly decreased abduction ROM Elbows full ROM no tenderness or swelling Right wrist decreased flexion ROM, tenderness to pressure, left wrist normal 2nd and 3rd MCP joint enlargement bilaterally, mild lateral deviation of fingers, heberdons nodes in DIPs most advanced in right 2nd finger Knees  slightly decreased ROM, patellofemoral crpeitus present, medial joint line tenderness to palpation Ankles full ROM no tenderness or swelling Toes severe arthritis changes with lateral deviations and cocked up position worse on right than left, left side postsurgical change  CDAI Exam: CDAI Score: 9  Patient Global: 40 mm; Provider Global: 20 mm Swollen: 0 ; Tender: 3  Joint Exam 02/05/2021      Right  Left  Wrist   Tender     Knee   Tender   Tender     Investigation: No additional findings.  Imaging: XR Foot Complete Left  Result Date: 01/31/2021 Films of the left knee were obtained in several projections.  There is been prior surgery on the second and third metatarsal head with osteotomies and screw fixation and a bunion procedure of the great metatarsal  head with screw fixation.  Screw position is fine.  No significant degenerative changes at the metatarsal phalangeal joints.  There are some degenerative changes of the IP joints of the toes.  Patient has a history of rheumatoid arthritis.  There is lateral drift of all the toes including the great.  No acute changes   Recent Labs: Lab Results  Component Value Date   WBC 3.5 09/15/2020   HGB 11.4 09/15/2020   PLT 198 09/15/2020   NA 143 10/04/2020   K 4.0 10/04/2020   CL 100 10/04/2020   CO2 29 10/04/2020   GLUCOSE 104 (H) 10/04/2020   BUN 21 10/04/2020   CREATININE 0.98 10/04/2020   BILITOT 0.4 09/15/2020   ALKPHOS 135 (H) 09/15/2020   AST 31 09/15/2020   ALT 17 09/15/2020   PROT 6.8 09/15/2020   ALBUMIN 4.1 09/15/2020   CALCIUM 9.5 10/04/2020   GFRAA 65 02/04/2020    Speciality Comments: No specialty comments available.  Procedures:  No procedures performed Allergies: Erythromycin   Assessment / Plan:     Visit Diagnoses: Rheumatoid arthritis, involving unspecified site, unspecified whether rheumatoid factor present ([place]) - Plan: Sedimentation rate, C-reactive protein  Chronic arthritis history there are MCP  enlargement and lateral deviations, cocked up toe deformities, and knee synovitis on MRI consistent with inflammatory changes.  I am not sure how much is the current disease activity or residual injury causing secondary osteoarthritis symptoms.  We will check ESR and CRP today also rechecking RA serologies.  Continue Enbrel 50 mg subcu weekly for now and we will follow-up.  High risk medication use - Plan: Rheumatoid factor, Cyclic citrul peptide antibody, IgG  Very recent lab results reviewed from last month with normal CBC and CMP hepatitis screening TB screening and chest imaging all available showing no problems for continued biologic DMARD.  Dry mouth  Very dry mouth symptom complaints and is clearly dry on exam.  Recommended continuing hydration and Biot[place] encouraged to try adding sugar-free gums or lozenges as salivary stimulation.  With comorbidities and medications less inclined to recommend secretagogue treatment unless doing worse.  Primary osteoarthritis of left knee  Considerable osteoarthritis but especially left leg affected at this time. We will have to see if there is room for improvement of chronic pain symptoms first evaluating and treating inflammatory disease.  Orders: Orders Placed This Encounter  Procedures   Sedimentation rate   C-reactive protein   Rheumatoid factor   Cyclic citrul peptide antibody, IgG    No orders of the defined types were placed in this encounter.    Follow-Up Instructions: Return for New pt RA on ENB f/u.   Collier Salina, MD  Note - This record has been created using [employer].  Chart creation errors have been sought, but may not always  have been located. Such creation errors do not reflect on  the standard of medical care.

## 2021-02-05 ENCOUNTER — Encounter: Payer: Self-pay | Admitting: Internal Medicine

## 2021-02-05 ENCOUNTER — Other Ambulatory Visit: Payer: Self-pay

## 2021-02-05 ENCOUNTER — Ambulatory Visit (INDEPENDENT_AMBULATORY_CARE_PROVIDER_SITE_OTHER): Payer: Medicare PPO | Admitting: Internal Medicine

## 2021-02-05 VITALS — BP 124/69 | HR 66 | Resp 17 | Ht 62.75 in | Wt 159.0 lb

## 2021-02-05 DIAGNOSIS — Z79899 Other long term (current) drug therapy: Secondary | ICD-10-CM | POA: Diagnosis not present

## 2021-02-05 DIAGNOSIS — M1712 Unilateral primary osteoarthritis, left knee: Secondary | ICD-10-CM

## 2021-02-05 DIAGNOSIS — R682 Dry mouth, unspecified: Secondary | ICD-10-CM

## 2021-02-05 DIAGNOSIS — M069 Rheumatoid arthritis, unspecified: Secondary | ICD-10-CM | POA: Diagnosis not present

## 2021-02-06 LAB — RHEUMATOID FACTOR: Rheumatoid fact SerPl-aCnc: <14 IU/mL (ref <14)

## 2021-02-06 LAB — SEDIMENTATION RATE: Sed Rate: 2 mm/h (ref 0–30)

## 2021-02-06 LAB — CYCLIC CITRUL PEPTIDE ANTIBODY, IGG: Cyclic Citrullin Peptide Ab: <16 UNITS

## 2021-02-06 LAB — C-REACTIVE PROTEIN: CRP: 11.9 mg/L — ABNORMAL HIGH (ref <8.0)

## 2021-02-21 ENCOUNTER — Other Ambulatory Visit: Payer: Self-pay | Admitting: Internal Medicine

## 2021-02-22 DIAGNOSIS — H353221 Exudative age-related macular degeneration, left eye, with active choroidal neovascularization: Secondary | ICD-10-CM | POA: Diagnosis not present

## 2021-02-23 DIAGNOSIS — S61451A Open bite of right hand, initial encounter: Secondary | ICD-10-CM | POA: Diagnosis not present

## 2021-02-23 DIAGNOSIS — T798XXA Other early complications of trauma, initial encounter: Secondary | ICD-10-CM | POA: Diagnosis not present

## 2021-02-23 DIAGNOSIS — W540XXA Bitten by dog, initial encounter: Secondary | ICD-10-CM | POA: Diagnosis not present

## 2021-02-28 ENCOUNTER — Other Ambulatory Visit: Payer: Self-pay

## 2021-02-28 ENCOUNTER — Emergency Department (HOSPITAL_BASED_OUTPATIENT_CLINIC_OR_DEPARTMENT_OTHER): Payer: Medicare PPO | Admitting: Radiology

## 2021-02-28 ENCOUNTER — Emergency Department (HOSPITAL_BASED_OUTPATIENT_CLINIC_OR_DEPARTMENT_OTHER)
Admission: EM | Admit: 2021-02-28 | Discharge: 2021-02-28 | Disposition: A | Discharge status: Home / Self Care | Payer: Medicare PPO | Attending: Emergency Medicine | Admitting: Emergency Medicine

## 2021-02-28 ENCOUNTER — Encounter (HOSPITAL_BASED_OUTPATIENT_CLINIC_OR_DEPARTMENT_OTHER): Payer: Self-pay

## 2021-02-28 DIAGNOSIS — Z7902 Long term (current) use of antithrombotics/antiplatelets: Secondary | ICD-10-CM | POA: Diagnosis not present

## 2021-02-28 DIAGNOSIS — W540XXA Bitten by dog, initial encounter: Secondary | ICD-10-CM | POA: Insufficient documentation

## 2021-02-28 DIAGNOSIS — S61451A Open bite of right hand, initial encounter: Secondary | ICD-10-CM | POA: Insufficient documentation

## 2021-02-28 DIAGNOSIS — T148XXA Other injury of unspecified body region, initial encounter: Secondary | ICD-10-CM

## 2021-02-28 LAB — CBC WITH DIFFERENTIAL/PLATELET
Abs Immature Granulocytes: 0.01 10*3/uL (ref 0.00–0.07)
Basophils Absolute: 0 10*3/uL (ref 0.0–0.1)
Basophils Relative: 1 %
Eosinophils Absolute: 0.2 10*3/uL (ref 0.0–0.5)
Eosinophils Relative: 6 %
HCT: 36.5 % (ref 36.0–46.0)
Hemoglobin: 11.7 g/dL — ABNORMAL LOW (ref 12.0–15.0)
Immature Granulocytes: 0 %
Lymphocytes Relative: 32 %
Lymphs Abs: 1.2 10*3/uL (ref 0.7–4.0)
MCH: 25.6 pg — ABNORMAL LOW (ref 26.0–34.0)
MCHC: 32.1 g/dL (ref 30.0–36.0)
MCV: 79.9 fL — ABNORMAL LOW (ref 80.0–100.0)
Monocytes Absolute: 0.5 10*3/uL (ref 0.1–1.0)
Monocytes Relative: 12 %
Neutro Abs: 1.8 10*3/uL (ref 1.7–7.7)
Neutrophils Relative %: 49 %
Platelets: 183 10*3/uL (ref 150–400)
RBC: 4.57 MIL/uL (ref 3.87–5.11)
RDW: 12.7 % (ref 11.5–15.5)
WBC: 3.8 10*3/uL — ABNORMAL LOW (ref 4.0–10.5)
nRBC: 0 % (ref 0.0–0.2)

## 2021-02-28 LAB — BASIC METABOLIC PANEL
Anion gap: 8 (ref 5–15)
BUN: 20 mg/dL (ref 8–23)
CO2: 34 mmol/L — ABNORMAL HIGH (ref 22–32)
Calcium: 9.2 mg/dL (ref 8.9–10.3)
Chloride: 99 mmol/L (ref 98–111)
Creatinine, Ser: 0.93 mg/dL (ref 0.44–1.00)
GFR, Estimated: >60 mL/min (ref >60)
Glucose, Bld: 93 mg/dL (ref 70–99)
Potassium: 3.5 mmol/L (ref 3.5–5.1)
Sodium: 141 mmol/L (ref 135–145)

## 2021-02-28 NOTE — ED Triage Notes (Addendum)
Pt's small dog bite the pt's R hand on 12/28. Pt has taken Amoxicillin, but states the wound continues to look infected. Noted wound in triage with localized erythema and purulent drainage. Pt denies fever. The dog was the pt's dog and is reported to be up-to-date on its vaccinations.

## 2021-02-28 NOTE — ED Provider Notes (Signed)
[facility] EMERGENCY DEPT Provider Note   CSN: [id number] Arrival date & time: 02/28/21  1615     History  Chief Complaint  Patient presents with   Animal Bite    Meghan Schwartz is a 71 y.o. female with a past medical history significant for rheumatoid arthritis who is on Enbrel who presents with increased pain, redness of a wound on her right hand from a dog bite.  Patient reports that she was already evaluated for this wound.  Patient reports the wound was sustained on 12/28, she sought evaluation on 12/30, and has been taking Augmentin since then.  Patient also reports that nightly she has been doing wound care where she applies hydrogen peroxide, rubs off any forming scab, and then applies Epsom salts.  Patient reports that she thinks the redness has increased slightly, and she reports some additional pain.  Patient denies any purulent drainage.  Patient is unsure whether she has felt any fever or chills, "maybe a few" she says.  She denies night sweats, feeling of heart racing, or objective fever.   Animal Bite     Home Medications Prior to Admission medications   Medication Sig Start Date End Date Taking? Authorizing Provider  acetaminophen (TYLENOL) 500 MG tablet Take 500 mg by mouth every 6 (six) hours as needed (For knee pain).    [provider]  cholecalciferol (VITAMIN D3) 25 MCG (1000 UT) tablet Take 1,000 Units by mouth daily.    [provider]  clonazePAM (KLONOPIN) 1 MG tablet Take 0.5-1 mg by mouth See admin instructions. 0.5 mg midday, 1 mg at bedtime 12/31/17   [provider]  cloNIDine (CATAPRES) 0.1 MG tablet Take 0.1 mg by mouth daily. 11/12/19   [provider]  clopidogrel (PLAVIX) 75 MG tablet TAKE 1 TABLET BY MOUTH EVERY DAY 07/31/20   Pricilla Riffle, MD  ENBREL SURECLICK 50 MG/ML injection Inject 50 mg into the skin once a week. 01/13/20   [provider]  Ergocalciferol (VITAMIN D2 PO) Vitamin D2  (obsolete)    [provider]  escitalopram (LEXAPRO) 10 MG tablet Take 10 mg by mouth daily.     [provider]  estradiol (VIVELLE-DOT) 0.075 MG/24HR Place 1 patch onto the skin 2 (two) times a week. Tuesday and Friday    [provider]  Fexofenadine HCl (ALLEGRA ALLERGY PO)     [provider]  Fexofenadine-Pseudoephedrine (ALLEGRA-D PO) Allegra Patient not taking: Reported on 02/05/2021    [provider]  KLOR-CON M20 20 MEQ tablet TAKE 2 TABLETS (40 MEQ TOTAL) BY MOUTH 3 (THREE) TIMES DAILY. 01/16/21   Pricilla Riffle, MD  metoprolol tartrate (LOPRESSOR) 25 MG tablet TAKE 1 TABLET BY MOUTH EVERY DAY 02/21/21   Pricilla Riffle, MD  Multiple Vitamin (MULTIVITAMIN ADULT PO) Multivitamin Patient not taking: Reported on 02/05/2021    [provider]  Multiple Vitamins-Minerals (MULTIVITAMIN WITH MINERALS) tablet Take 1 tablet by mouth daily.    [provider]  nitroGLYCERIN (NITROSTAT) 0.4 MG SL tablet Place 1 tablet (0.4 mg total) under the tongue every 5 (five) minutes as needed for chest pain. 01/28/18   Rai, Ripudeep K, MD  rosuvastatin (CRESTOR) 20 MG tablet TAKE 1 TABLET BY MOUTH EVERY DAY 05/02/20   Pricilla Riffle, MD  torsemide (DEMADEX) 20 MG tablet Take 1 tablet (20 mg total) by mouth 2 (two) times daily. 09/18/20   Pricilla Riffle, MD  venlafaxine XR (EFFEXOR-XR) 150 MG 24  hr capsule     [provider]  ZUBSOLV 5.7-1.4 MG SUBL Place 1 tablet under the tongue 3 (three) times daily. 12/30/18   [provider]      Allergies    Erythromycin    Review of Systems   Review of Systems  Skin:  Positive for wound.  All other systems reviewed and are negative.  Physical Exam Updated Vital Signs BP (!) 111/58 (BP Location: Left Arm)    Pulse 64    Temp 98.5 F (36.9 C) (Oral)    Resp 20    Ht 5\' 2"  (1.575 m)    Wt 68.9 kg    SpO2 93%    BMI 27.80 kg/m  Physical Exam Vitals and nursing note reviewed.   Constitutional:      General: She is not in acute distress.    Appearance: Normal appearance.  HENT:     Head: Normocephalic and atraumatic.  Eyes:     General:        Right eye: No discharge.        Left eye: No discharge.  Cardiovascular:     Rate and Rhythm: Normal rate and regular rhythm.  Pulmonary:     Effort: Pulmonary effort is normal. No respiratory distress.  Musculoskeletal:        General: No deformity.     Comments: Intact range of motion of the right wrist, no evidence of joint effusion, redness, swelling.  Patient has intact strength at the wrist to flexion extension, as well as intact strength of all fingers to flexion, extension.  Skin:    General: Skin is warm and dry.     Comments: With approximately 1 cm round lesion with may be 2 cm of surrounding erythema.  There is no area of fluctuance, or purulent drainage.  There is no tracking redness extending proximally or distally from the wound.  Neurological:     Mental Status: She is alert and oriented to person, place, and time.  Psychiatric:        Mood and Affect: Mood normal.        Behavior: Behavior normal.    ED Results / Procedures / Treatments   Labs (all labs ordered are listed, but only abnormal results are displayed) Labs Reviewed  BASIC METABOLIC PANEL - Abnormal; Notable for the following components:      Result Value   CO2 34 (*)    All other components within normal limits  CBC WITH DIFFERENTIAL/PLATELET - Abnormal; Notable for the following components:   WBC 3.8 (*)    Hemoglobin 11.7 (*)    MCV 79.9 (*)    MCH 25.6 (*)    All other components within normal limits    EKG None  Radiology DG Hand Complete Right  Result Date: 02/28/2021 CLINICAL DATA:  dog bite EXAM: RIGHT HAND - COMPLETE 3+ VIEW COMPARISON:  None. FINDINGS: No cortical erosion or destruction to suggest osteomyelitis. There is no evidence of fracture or dislocation. Redemonstration of juxta-articular lucencies likely  related to rheumatoid arthritis. Joint space narrowing most prominent at the distal interphalangeal joint of the first and second digit as well as carpometacarpal joint of the second third digits. Subluxation of the second digit distal phalanx and relation to the middle phalanx. Redemonstration of a calcific density along the distal ulna. Soft tissues are unremarkable. No retained radiopaque foreign body. IMPRESSION: 1. No acute displaced fracture or dislocation. 2. No radiographic findings suggest osteomyelitis. 3. No retained radiopaque foreign  body. Electronically Signed   By: Tish Frederickson M.D.   On: 02/28/2021 17:43    Procedures Procedures    Medications Ordered in ED Medications - No data to display  ED Course/ Medical Decision Making/ A&P                           Medical Decision Making  This is an overall well-appearing patient with past medical history significant for immunosuppression secondary to rheumatoid arthritis medication who presents with reevaluation of a dog bite that was sustained on 12/28.  Patient reports that she has been taking Augmentin for the last 5 days.  Patient has also been cleaning the wound inappropriately with Epsom salts and hydrogen peroxide nightly.  Referral trolled diagnosis includes worsening infection, cellulitis, abscess formation, septic arthritis, osteomyelitis, and bacteremia or sepsis.  I do not see any evidence of abscess formation, septic arthritis, or infection tracking distally or proximally from the wound.  In this context we counseled patient on proper wound care with leaving the wound open if she is sitting around doing nothing, keeping it covered with some Polysporin if she is going to be getting her hands dirty, and otherwise not continuing to wash it with Epsom salts and hydroperoxide.  There is no evidence of osteomyelitis on her x-ray.  We discharge patient with instructions to continue taking her Augmentin, proper wound care, and the  number for hand specialist to follow-up with if she has worsening signs of infection despite adequate treatment.  Discharged in stable condition at this time, return precautions given.   Final Clinical Impression(s) / ED Diagnoses Final diagnoses:  Animal bite    Rx / DC Orders ED Discharge Orders     None         [place] 02/28/21 [id number]    Pollyann Savoy, MD 02/28/21 [id number]

## 2021-02-28 NOTE — Discharge Instructions (Addendum)
Please continue taking your Augmentin as prescribed.  Just to be safe I would go ahead and complete the entire 14-day course that you are prescribed.  As we discussed I recommend that you discontinue cleaning with Epsom salts and hydrogen peroxide.  If you would like to put something on the wound you can do a small amount of Polysporin or Neosporin ointment.  You can keep the wound covered if you think that is going to get dirty otherwise it is okay to keep it open.  Please monitor for worsening signs of infection, especially if you have inability to bend your wrist, or red streaking up from the side of the wound.  Catch the number of the hand specialist free to call for follow-up if you think that the infection is worsening.  It was a pleasure taking care of you today I hope that the wound heals soon.

## 2021-03-04 NOTE — Progress Notes (Signed)
Office Visit Note  Patient: Meghan Schwartz             Date of Birth: 09-Jun-1950           MRN: [id number]             PCP: Shirlean Mylar, MD Referring: No ref. provider found Visit Date: 03/05/2021   Subjective:  Follow-up (Doing good, dog bite recently)   History of Present Illness: Meghan Schwartz is a 71 y.o. female here for follow up for RA on Enbrel 50 mg [place] weekly. She also appeared to have considerable osteoarthritis contribution to symptoms in hands and knees as well. Inflammatory markers with mildly elevated CRP of 11.9. She has been overall well except suffered a bite to her right hand from her dog currently on augmentin after ED visit with total of 14 days prescribed. Left knee pain is worse than usual posteriorly with swelling she reports previous baker's cyst has bothered her since she had knee surgery in the past.  Previous HPI 02/05/21 Meghan Schwartz is a 71 y.o. female her for rheumatoid arthritis on Enbrel 50 mg [place] weekly for which she has been seeing Dr. Nickola Major previously. She was originally diagnosed about 10 or 11 years ago due to development of joint pain, stiffness, synovitis, and progressive joint deformities primarily starting in the feet and knees.  Subsequently proceeded to have increased joint pains involving bilateral hands with some swelling at the PIP and MCP joints.  She started treatment with Enbrel injections with some improvement in the lower extremity joint pains she recalls still having a lot of pain symptoms and pronounced fatigue that did not clear with this treatment.  Combination treatment was tried with methotrexate with significant GI intolerance.  She tried leflunomide but developed substantial alopecia so discontinued the medicine.  She switched to hydroxychloroquine combination treatment which was being well-tolerated but she has had multiple new or worsening medical problems over the past few years with concern of medication related effects. She had hand  numbness in the past improved after left carpal tunnel release surgery but she has some residual muscle atrophy in thenar prominence. She had left knee meniscectomy in 2020. She suffered NSTEMI in 01/2018 with DES placement in RCA for 99% blockage and has chronic diastolic CHF. Her edema is generally controlled while taking torsemide but does rapidly re accumulate fluid off this medication. Ophthalmology evaluation indicated significant visual field deficits findings consistent with age-related macular degeneration of both eyes and recommended avoidance of this medication.  She also has considerable osteoarthritis especially in her knees and feet previous surgical reconstruction on the left foot. Overall she feels her gait is not very stable due to these problems.   DMARD Hx Enbrel - current   HCQ - macular disease? LEF - alopecia MTX - GI intolerance.    Review of Systems  Constitutional:  Positive for fatigue.  HENT:  Positive for mouth dryness.   Eyes:  Positive for dryness.  Respiratory:  Positive for shortness of breath.   Cardiovascular:  Negative for swelling in legs/feet.  Gastrointestinal:  Negative for constipation.  Endocrine: Positive for cold intolerance, heat intolerance, excessive thirst and increased urination.  Genitourinary:  Negative for difficulty urinating.  Musculoskeletal:  Positive for joint pain, gait problem, joint pain, joint swelling, muscle weakness, morning stiffness and muscle tenderness.  Skin:  Negative for rash.  Allergic/Immunologic: Positive for susceptible to infections.  Neurological:  Positive for weakness. Negative for numbness.  Hematological:  Positive for bruising/bleeding tendency.  Psychiatric/Behavioral:  Negative for sleep disturbance.    PMFS History:  Patient Active Problem List   Diagnosis Date Noted   Dog bite 03/05/2021   High risk medication use 02/05/2021   Dry mouth 02/05/2021   Pain in left foot 01/31/2021   Bunion of great  toe of right foot 06/15/2020   Hypoxia 03/05/2019   Derangement of posterior horn of medial meniscus 02/02/2019   Derangement of posterior horn of lateral meniscus 02/02/2019   Meniscal cyst, unspecified laterality 12/01/2018   Torn medial meniscus 12/01/2018   Primary osteoarthritis of left knee 12/01/2018   Coronary artery disease involving native coronary artery of native heart without angina pectoris 09/14/2018   Chronic diastolic (congestive) heart failure (HCC)    Chronic pain of left knee 05/15/2018   Mass of left knee 05/15/2018   Non-ST elevation (NSTEMI) myocardial infarction [facility])    Chest pain 01/25/2018   RA (rheumatoid arthritis) ([place]) 01/25/2018   Hyperlipidemia 01/25/2018   Hypokalemia 01/25/2018   Depression with anxiety 01/25/2018   Tobacco abuse 01/25/2018   Lower extremity cellulitis 01/25/2018   Benzodiazepine dependence ([place])    MDD (major depressive disorder), recurrent severe, without psychosis ([place]) 10/02/2017   CTS (carpal tunnel syndrome) 06/08/2014    Past Medical History:  Diagnosis Date   Allergic rhinitis    Anxiety    Benzodiazepine dependence (HCC)    Bruxism    CAD (coronary artery disease)    S/p NSTEMI 01/2018 >> LHC: oLAD 79, pLAD 20; oD1 20, oLCx 40, pRCA 99 >> PCI: DES to prox RCA // Echo 01/2018: EF 50-55; prob inf-lat and inf HK   Chronic diastolic heart failure    Echocardiogram 07/2018: EF 123456, grade 1 diastolic dysfunction, normal wall motion, normal GLS (-22.3), PASP 28   Chronic foot pain    bunions, torn ligaments-on chronic pain medication   CTS (carpal tunnel syndrome) 06/08/2014   Depression    High cholesterol    Hypertension    MDD (major depressive disorder) 10/02/2017   Migraine    Myocardial infarction ([place]) 02/01/2018   Pneumonia 1986   left lung   RA (rheumatoid arthritis) ([place])     Family History  Problem Relation Age of Onset   Thyroid disease Mother    Breast cancer Mother    Parkinson's disease Father     Raynaud syndrome Maternal Aunt    Arthritis Maternal Grandmother    Heart attack Maternal Grandmother    Stroke Paternal Grandmother    Heart attack Paternal Grandfather    Past Surgical History:  Procedure Laterality Date   BTL  2000   CARPAL TUNNEL RELEASE Left    CESAREAN [place] N/A 01/27/2018   Procedure: CORONARY STENT INTERVENTION;  Surgeon: Burnell Blanks, MD;  Location: [place] [facility];  Service: Cardiovascular;  Laterality: N/A;   eye implants     FOOT SURGERY Left 2008   front teeth removed after injury     KNEE ARTHROSCOPY WITH LATERAL MENISECTOMY Left 02/02/2019   Procedure: LEFT KNEE ARTHROSCOPY, MEDIAL AND LATERAL MENISECTOMY, EXCISION OF LEFT LATERAL MENISCAL CYST;  Surgeon: Garald Balding, MD;  Location: [facility];  Service: Orthopedics;  Laterality: Left;   LEFT HEART CATH AND CORONARY ANGIOGRAPHY N/A 01/27/2018   Procedure: LEFT HEART CATH AND CORONARY ANGIOGRAPHY;  Surgeon: Burnell Blanks, MD;  Location: [facility];  Service: Cardiovascular;  Laterality: N/A;   pre  cancerous lesion removed from forehead     TOTAL ABDOMINAL HYSTERECTOMY W/ BILATERAL SALPINGOOPHORECTOMY  2002   Dr. Willis Modena   VAGINAL HYSTERECTOMY  2001   Social History   Social History Narrative   Lives at home with spouse.    Right handed.   Caffeine use: 2 cups coffee/day    8 oz soda/day    Immunization History  Administered Date(s) Administered   PFIZER(Purple Top)SARS-COV-2 Vaccination 04/10/2019, 05/05/2019, 10/19/2019, 06/20/2020   Pfizer Covid-19 Vaccine Bivalent Booster 79yrs & up 11/29/2020   Tdap 04/03/2018     Objective: Vital Signs: BP 115/68 (BP Location: Left Arm, Patient Position: Sitting, Cuff Size: Normal)    Pulse 66    Resp 18    Ht 5' 2.75" (1.594 m)    Wt 161 lb (73 kg)    BMI 28.75 kg/m    Physical Exam Musculoskeletal:     Right lower leg: No edema.     Left lower leg: No edema.  Skin:     General: Skin is warm and dry.     Findings: No rash.     Comments: Right hand wound with scab over dorsum of right hand near base of thumb, faint surrounding erythema, no warmth, induration or tenderness  Neurological:     Mental Status: She is alert.  Psychiatric:        Mood and Affect: Mood normal.     Musculoskeletal Exam:  2nd and 3rd MCP joint enlargement bilaterally, mild lateral deviation of fingers, heberdons nodes in DIPs most advanced in right 2nd finger Left knee with palpable effusion and small fluid collection posteriorly, slightly decreased flexion ROM, no warmth or erythema, medial joint line tenderness to pressure   Investigation: No additional findings.  Imaging: DG Hand Complete Right  Result Date: 02/28/2021 CLINICAL DATA:  dog bite EXAM: RIGHT HAND - COMPLETE 3+ VIEW COMPARISON:  None. FINDINGS: No cortical erosion or destruction to suggest osteomyelitis. There is no evidence of fracture or dislocation. Redemonstration of juxta-articular lucencies likely related to rheumatoid arthritis. Joint space narrowing most prominent at the distal interphalangeal joint of the first and second digit as well as carpometacarpal joint of the second third digits. Subluxation of the second digit distal phalanx and relation to the middle phalanx. Redemonstration of a calcific density along the distal ulna. Soft tissues are unremarkable. No retained radiopaque foreign body. IMPRESSION: 1. No acute displaced fracture or dislocation. 2. No radiographic findings suggest osteomyelitis. 3. No retained radiopaque foreign body. Electronically Signed   By: Iven Finn M.D.   On: 02/28/2021 17:43    Recent Labs: Lab Results  Component Value Date   WBC 3.8 (L) 02/28/2021   HGB 11.7 (L) 02/28/2021   PLT 183 02/28/2021   NA 141 02/28/2021   K 3.5 02/28/2021   CL 99 02/28/2021   CO2 34 (H) 02/28/2021   GLUCOSE 93 02/28/2021   BUN 20 02/28/2021   CREATININE 0.93 02/28/2021   BILITOT 0.4  09/15/2020   ALKPHOS 135 (H) 09/15/2020   AST 31 09/15/2020   ALT 17 09/15/2020   PROT 6.8 09/15/2020   ALBUMIN 4.1 09/15/2020   CALCIUM 9.2 02/28/2021   GFRAA 65 02/04/2020    Speciality Comments: No specialty comments available.  Procedures:  Large Joint Inj: L knee on 03/05/2021 4:00 PM Indications: pain, joint swelling and diagnostic evaluation Details: 22 G 1.5 in needle, superolateral approach Medications: 3 mL lidocaine 1 %; 40 mg triamcinolone acetonide 40 MG/ML Aspirate: 37 mL yellow and  blood-tinged; sent for lab analysis Outcome: tolerated well, no immediate complications Procedure, treatment alternatives, risks and benefits explained, specific risks discussed. Consent was given by the patient. Immediately prior to procedure a time out was called to verify the correct patient, procedure, equipment, support staff and site/side marked as required. Patient was prepped and draped in the usual sterile fashion.    Allergies: Erythromycin   Assessment / Plan:     Visit Diagnoses: Rheumatoid arthritis, involving unspecified site, unspecified whether rheumatoid factor present ([place]) - Plan: Synovial Fluid Analysis, Complete, Large Joint Inj: L knee  Symptoms appear well controlled on Enbrel low disease activity. Only active swelling today in the left knee which we aspirated today for diagnostic and therapeutic purposes. Plan to continue current Enbrel 50 mg [place] weekly.  High risk medication use  Recent lab results reviewed all look okay for continuing TNF inhibitor treatment.  Primary osteoarthritis of left knee - Plan: Synovial Fluid Analysis, Complete, Large Joint Inj: L knee  Left knee pain and swelling out of proportion to other joint symptoms currently. She may develop some further exacerbation holding Enbrel for now due to dog bite and antibiotic treatment. Aspiration and injection today for symptomatic relief and hopefully also decrease popliteal cyst size to improve knee  ROM.  Dog bite, sequela  On augmentin, wound appears well healing on exam. She is holding Enbrel until after finishing augmentin this will be 2 total doses.  Orders: Orders Placed This Encounter  Procedures   Large Joint Inj: L knee   Synovial Fluid Analysis, Complete   No orders of the defined types were placed in this encounter.    Follow-Up Instructions: Return in about 3 months (around 06/03/2021) for RA on ENB f/u 46mos.   Collier Salina, MD  Note - This record has been created using [employer].  Chart creation errors have been sought, but may not always  have been located. Such creation errors do not reflect on  the standard of medical care.

## 2021-03-05 ENCOUNTER — Ambulatory Visit: Payer: Medicare PPO | Admitting: Internal Medicine

## 2021-03-05 ENCOUNTER — Other Ambulatory Visit: Payer: Self-pay

## 2021-03-05 ENCOUNTER — Encounter: Payer: Self-pay | Admitting: Internal Medicine

## 2021-03-05 VITALS — BP 115/68 | HR 66 | Resp 18 | Ht 62.75 in | Wt 161.0 lb

## 2021-03-05 DIAGNOSIS — M069 Rheumatoid arthritis, unspecified: Secondary | ICD-10-CM

## 2021-03-05 DIAGNOSIS — Z79899 Other long term (current) drug therapy: Secondary | ICD-10-CM

## 2021-03-05 DIAGNOSIS — M1712 Unilateral primary osteoarthritis, left knee: Secondary | ICD-10-CM

## 2021-03-05 DIAGNOSIS — W540XXS Bitten by dog, sequela: Secondary | ICD-10-CM

## 2021-03-05 DIAGNOSIS — W540XXA Bitten by dog, initial encounter: Secondary | ICD-10-CM | POA: Insufficient documentation

## 2021-03-05 LAB — SYNOVIAL FLUID ANALYSIS, COMPLETE
Basophils, %: 0 %
Eosinophils-Synovial: 0 % (ref 0–2)
Lymphocytes-Synovial Fld: 18 % (ref 0–74)
Monocyte/Macrophage: 76 % — ABNORMAL HIGH (ref 0–69)
Neutrophil, Synovial: 6 % (ref 0–24)
Synoviocytes, %: 0 % (ref 0–15)
WBC, Synovial: 569 cells/uL — ABNORMAL HIGH (ref <150)

## 2021-03-05 NOTE — Patient Instructions (Addendum)
Joint Steroid Injection A joint steroid injection is a procedure to relieve swelling and pain in a joint. Steroids are medicines that reduce inflammation. In this procedure, your health care provider uses a syringe and a needle to inject a steroid medicine into a painful and inflamed joint. A pain-relieving medicine (anesthetic) may be injected along with the steroid. In some cases, your health care provider may use an imaging technique such as ultrasound or fluoroscopy to guide the injection. Joints that are often treated with steroid injections include the knee, shoulder, hip, and spine. These injections may also be used in the elbow, ankle, and joints of the hands or feet. You may have joint steroid injections as part of your treatment for inflammation caused by: Gout. Rheumatoid arthritis. Advanced wear-and-tear arthritis (osteoarthritis). Tendinitis. Bursitis. Joint steroid injections may be repeated, but having them too often can damage a joint or the skin over the joint. You should not have joint steroid injections less than 6 weeks apart or more than four times a year. Tell a health care provider about: Any allergies you have. All medicines you are taking, including vitamins, herbs, eye drops, creams, and over-the-counter medicines. Any problems you or family members have had with anesthetic medicines. Any blood disorders you have. Any surgeries you have had. Any medical conditions you have. Whether you are pregnant or may be pregnant. What are the risks? Generally, this is a safe treatment. However, problems may occur, including: Infection. Bleeding. Allergic reactions to medicines. Damage to the joint or tissues around the joint. Thinning of skin or loss of skin color over the joint. Temporary flushing of the face or chest. Temporary increase in pain. Temporary increase in blood sugar. Failure to relieve inflammation or pain. What happens before the treatment? Medicines Ask  your health care provider about: Changing or stopping your regular medicines. This is especially important if you are taking diabetes medicines or blood thinners. Taking medicines such as aspirin and ibuprofen. These medicines can thin your blood. Do not take these medicines unless your health care provider tells you to take them. Taking over-the-counter medicines, vitamins, herbs, and supplements. General instructions You may have imaging tests of your joint. Ask your health care provider if you can drive yourself home after the procedure. What happens during the treatment?  Your health care provider will position you for the injection and locate the injection site over your joint. The skin over the joint will be cleaned with a germ-killing soap. Your health care provider may: Spray a numbing solution (topical anesthetic) over the injection site. Inject a local anesthetic under the skin above your joint. The needle will be placed through your skin into your joint. Your health care provider may use imaging to guide the needle to the right spot for the injection. If imaging is used, a special contrast dye may be injected to confirm that the needle is in the correct location. The steroid medicine will be injected into your joint. Anesthetic may be injected along with the steroid. This may be a medicine that relieves pain for a short time (short-acting anesthetic) or for a longer time (long-acting anesthetic). The needle will be removed, and an adhesive bandage (dressing) will be placed over the injection site. The procedure may vary among health care providers and hospitals. What can I expect after the treatment? You will be able to go home after the treatment. It is normal to feel slight flushing for a few days after the injection. After the treatment, it is   common to have an increase in joint pain after the anesthetic has worn off. This may happen about an hour after a short-acting anesthetic  or about 8 hours after a longer-acting anesthetic. You should begin to feel relief from joint pain and swelling after 24 to 48 hours. Contact your health care provider if you do not begin to feel relief after 2 days. Follow these instructions at home: Injection site care Leave the adhesive dressing over your injection site in place until your health care provider says you can remove it. Check your injection site every day for signs of infection. Check for: More redness, swelling, or pain. Fluid or blood. Warmth. Pus or a bad smell. Activity Return to your normal activities as told by your health care provider. Ask your health care provider what activities are safe for you. You may be asked to limit activities that put stress on the joint for a few days. Do joint exercises as told by your health care provider. Do not take baths, swim, or use a hot tub until your health care provider approves. Ask your health care provider if you may take showers. You may only be allowed to take sponge baths. Managing pain, stiffness, and swelling  If directed, put ice on the joint. To do this: Put ice in a plastic bag. Place a towel between your skin and the bag. Leave the ice on for 20 minutes, 2-3 times a day. Remove the ice if your skin turns bright red. This is very important. If you cannot feel pain, heat, or cold, you have a greater risk of damage to the area. Raise (elevate) your joint above the level of your heart when you are sitting or lying down. General instructions Take over-the-counter and prescription medicines only as told by your health care provider. Do not use any products that contain nicotine or tobacco, such as cigarettes, e-cigarettes, and chewing tobacco. These can delay joint healing. If you need help quitting, ask your health care provider. If you have diabetes, be aware that your blood sugar may be slightly elevated for several days after the injection. Keep all follow-up visits.  This is important. Contact a health care provider if you have: Chills or a fever. Any signs of infection at your injection site. Increased pain or swelling or no relief after 2 days. Summary A joint steroid injection is a treatment to relieve pain and swelling in a joint. Steroids are medicines that reduce inflammation. Your health care provider may add an anesthetic along with the steroid. You may have joint steroid injections as part of your arthritis treatment. Joint steroid injections may be repeated, but having them too often can damage a joint or the skin over the joint. Contact your health care provider if you have a fever, chills, or signs of infection, or if you get no relief from joint pain or swelling.  

## 2021-03-06 ENCOUNTER — Telehealth: Payer: Self-pay | Admitting: Pharmacist

## 2021-03-06 MED ORDER — LIDOCAINE HCL 1 % IJ SOLN
3.0000 mL | INTRAMUSCULAR | Status: AC | PRN
  Administered 2021-03-05: 3 mL

## 2021-03-06 MED ORDER — TRIAMCINOLONE ACETONIDE 40 MG/ML IJ SUSP
40.0000 mg | INTRAMUSCULAR | Status: AC | PRN
  Administered 2021-03-05: 40 mg via INTRA_ARTICULAR

## 2021-03-06 NOTE — Telephone Encounter (Addendum)
Please start Enbrel Sureclick BIV. This is continuation of therapy.  Dose: 50mg  SQ every 7 days  Dx: Rheumatoid arthritis (M05.9)  Previously tried therapies: Methotrexate - GI intolerance Leflunomide - alopecia Hydroxychloroquine - significant visual field deficit findings consistent with age-related macular degeneration of both eyes. Ophthalmology recommended avoidance of this medication  ----- Message from Collier Salina, MD sent at 03/06/2021  8:35 AM EST ----- Regarding: Enbrel Meghan Schwartz is transferring care to our office she is well controlled on current RA treatment with Enbrel 50 mg [place] weekly. We will need to take over prescribing for her labs reviewed from 02/05/21 or in media from outside office.

## 2021-03-06 NOTE — Progress Notes (Signed)
The left knee fluid we aspirated shows a low number of white blood cells this looks more consistent with coming from her osteoarthritis and previous injuries in that knee and not a flare up of her rheumatoid arthritis activity. Hopefully some swelling is decreased with the steroid injection. We can continue the current Enbrel.

## 2021-03-07 ENCOUNTER — Other Ambulatory Visit (HOSPITAL_COMMUNITY): Payer: Self-pay

## 2021-03-07 NOTE — Telephone Encounter (Signed)
Submitted a Prior Authorization request to [facility] for ENBREL Mini via CoverMyMeds. Will update once we receive a response.  Key: Gregery Na, PharmD, MPH, BCPS Clinical Pharmacist (Rheumatology and Pulmonology)

## 2021-03-13 ENCOUNTER — Other Ambulatory Visit (HOSPITAL_COMMUNITY): Payer: Self-pay

## 2021-03-13 NOTE — Telephone Encounter (Signed)
Received notification from [facility] regarding a prior authorization for ENBREL. Authorization has been APPROVED from 02/26/20 to 02/24/22.  Copay is $100 for 28 day supply  Patient can fill through [employer]: [phone] however patient is not locked into specific pharmacy.   ATC patient to review where she'd like refill sent. Per dispense report, last Enbrel rx was filled on 01/10/21 at [facility]. Left VM requesting return call  Chesley Mires, PharmD, MPH, BCPS Clinical Pharmacist (Rheumatology and Pulmonology)

## 2021-03-14 MED ORDER — ENBREL SURECLICK 50 MG/ML ~~LOC~~ SOAJ: 50.0000 mg | SUBCUTANEOUS | 2 refills | Status: DC

## 2021-03-22 ENCOUNTER — Telehealth: Payer: Self-pay | Admitting: Orthopaedic Surgery

## 2021-03-22 ENCOUNTER — Other Ambulatory Visit: Payer: Self-pay

## 2021-03-22 DIAGNOSIS — M79672 Pain in left foot: Secondary | ICD-10-CM

## 2021-03-22 NOTE — Telephone Encounter (Signed)
Referral has been placed for Dr.Duda.

## 2021-03-22 NOTE — Telephone Encounter (Signed)
Ok to refer to Dr Lajoyce Corners

## 2021-03-22 NOTE — Telephone Encounter (Signed)
Patient called asked if she can be referred to Dr. Lajoyce Corners for her left foot. Patient said she is having trouble walking when she go out. Patient said her left foot 4th toe pain. The number to contact patient is [phone]

## 2021-03-24 ENCOUNTER — Other Ambulatory Visit: Payer: Self-pay | Admitting: Internal Medicine

## 2021-04-05 ENCOUNTER — Other Ambulatory Visit: Payer: Self-pay

## 2021-04-05 ENCOUNTER — Ambulatory Visit: Payer: Medicare PPO | Admitting: Orthopedic Surgery

## 2021-04-05 DIAGNOSIS — M79672 Pain in left foot: Secondary | ICD-10-CM

## 2021-04-05 DIAGNOSIS — M05772 Rheumatoid arthritis with rheumatoid factor of left ankle and foot without organ or systems involvement: Secondary | ICD-10-CM

## 2021-04-05 DIAGNOSIS — M05771 Rheumatoid arthritis with rheumatoid factor of right ankle and foot without organ or systems involvement: Secondary | ICD-10-CM | POA: Diagnosis not present

## 2021-04-05 DIAGNOSIS — M205X2 Other deformities of toe(s) (acquired), left foot: Secondary | ICD-10-CM | POA: Diagnosis not present

## 2021-04-05 DIAGNOSIS — M2011 Hallux valgus (acquired), right foot: Secondary | ICD-10-CM

## 2021-04-06 ENCOUNTER — Encounter: Payer: Self-pay | Admitting: Orthopedic Surgery

## 2021-04-06 NOTE — Progress Notes (Signed)
Office Visit Note   Patient: Meghan Schwartz           Date of Birth: Feb 18, 1951           MRN: [id number] Visit Date: 04/05/2021              Requested by: Garald Balding, MD [address] [place],  [place] [address] PCP: Maurice Small, MD  Chief Complaint  Patient presents with   Left Foot - Pain      HPI: Patient is a 71 year old woman who is seen in referral from Dr. Durward Fortes.  Patient complains of pain and clawing of the fourth toe.  Patient states that the pain affects her life.  It keeps her from walking at times she states she feels like a corkscrew is going up her fourth toe.  Patient is status post surgery with a Weil osteotomy to the second and third toes however there is an incision dorsally over the second third and fourth toes.  Assessment & Plan: Visit Diagnoses:  1. Pain in left foot   2. Hallux valgus (acquired), right foot   3. Rheumatoid arthritis involving both feet with positive rheumatoid factor (HCC)   4. Claw toe, acquired, left     Plan: The callus was pared there was no abscess she states she does feel better.  Recommended a silicone pad to be worn over the toe.  Discussed that she may require a Weil osteotomy of the fourth metatarsal and PIP resection.  Follow-Up Instructions: Return if symptoms worsen or fail to improve.   Ortho Exam  Patient is alert, oriented, no adenopathy, well-dressed, normal affect, normal respiratory effort. Examination patient has palpable pulses.  She has some flexible clawing of the second and third toes and fixed clawing of the fourth toe with the toe plantarflexed and curled.  After informed consent the callus was pared.  There is no abscess no signs of any deep infection.  There is no cellulitis or swelling.  No signs of a paronychial infection.  Radiograph shows no destructive bony changes of the fourth toe.  She has no pain in the second or third toe she does have a bunion deformity of the great toe.  Imaging: No  results found. No images are attached to the encounter.  Labs: Lab Results  Component Value Date   ESRSEDRATE 2 02/05/2021   ESRSEDRATE 28 09/15/2020   CRP 11.9 (H) 02/05/2021   CRP 3.7 (H) 03/05/2019     Lab Results  Component Value Date   ALBUMIN 4.1 09/15/2020   ALBUMIN 2.7 (L) 03/06/2019   ALBUMIN 3.0 (L) 03/05/2019    Lab Results  Component Value Date   MG 2.0 01/25/2018   MG 2.2 10/02/2017   No results found for: VD25OH  No results found for: PREALBUMIN CBC EXTENDED Latest Ref Rng & Units 02/28/2021 09/15/2020 02/04/2020  WBC 4.0 - 10.5 K/uL 3.8(L) 3.5 5.2  RBC 3.87 - 5.11 MIL/uL 4.57 4.12 4.82  HGB 12.0 - 15.0 g/dL 11.7(L) 11.4 13.4  HCT 36.0 - 46.0 % 36.5 34.1 39.5  PLT 150 - 400 K/uL 183 198 209  NEUTROABS 1.7 - 7.7 K/uL 1.8 - -  LYMPHSABS 0.7 - 4.0 K/uL 1.2 - -     There is no height or weight on file to calculate BMI.  Orders:  No orders of the defined types were placed in this encounter.  No orders of the defined types were placed in this encounter.    Procedures:  No procedures performed  Clinical Data: No additional findings.  ROS:  All other systems negative, except as noted in the HPI. Review of Systems  Objective: Vital Signs: There were no vitals taken for this visit.  Specialty Comments:  No specialty comments available.  PMFS History: Patient Active Problem List   Diagnosis Date Noted   Dog bite 03/05/2021   High risk medication use 02/05/2021   Dry mouth 02/05/2021   Pain in left foot 01/31/2021   Bunion of great toe of right foot 06/15/2020   Hypoxia 03/05/2019   Derangement of posterior horn of medial meniscus 02/02/2019   Derangement of posterior horn of lateral meniscus 02/02/2019   Meniscal cyst, unspecified laterality 12/01/2018   Torn medial meniscus 12/01/2018   Primary osteoarthritis of left knee 12/01/2018   Coronary artery disease involving native coronary artery of native heart without angina pectoris  09/14/2018   Chronic diastolic (congestive) heart failure (HCC)    Chronic pain of left knee 05/15/2018   Mass of left knee 05/15/2018   Non-ST elevation (NSTEMI) myocardial infarction [facility])    Chest pain 01/25/2018   RA (rheumatoid arthritis) ([place]) 01/25/2018   Hyperlipidemia 01/25/2018   Hypokalemia 01/25/2018   Depression with anxiety 01/25/2018   Tobacco abuse 01/25/2018   Lower extremity cellulitis 01/25/2018   Benzodiazepine dependence ([place])    MDD (major depressive disorder), recurrent severe, without psychosis ([place]) 10/02/2017   CTS (carpal tunnel syndrome) 06/08/2014   Past Medical History:  Diagnosis Date   Allergic rhinitis    Anxiety    Benzodiazepine dependence (HCC)    Bruxism    CAD (coronary artery disease)    S/p NSTEMI 01/2018 >> LHC: oLAD 52, pLAD 20; oD1 20, oLCx 40, pRCA 99 >> PCI: DES to prox RCA // Echo 01/2018: EF 50-55; prob inf-lat and inf HK   Chronic diastolic heart failure    Echocardiogram 07/2018: EF 123456, grade 1 diastolic dysfunction, normal wall motion, normal GLS (-22.3), PASP 28   Chronic foot pain    bunions, torn ligaments-on chronic pain medication   CTS (carpal tunnel syndrome) 06/08/2014   Depression    High cholesterol    Hypertension    MDD (major depressive disorder) 10/02/2017   Migraine    Myocardial infarction ([place]) 02/01/2018   Pneumonia 1986   left lung   RA (rheumatoid arthritis) ([place])     Family History  Problem Relation Age of Onset   Thyroid disease Mother    Breast cancer Mother    Parkinson's disease Father    Raynaud syndrome Maternal Aunt    Arthritis Maternal Grandmother    Heart attack Maternal Grandmother    Stroke Paternal Grandmother    Heart attack Paternal Grandfather     Past Surgical History:  Procedure Laterality Date   BTL  2000   CARPAL TUNNEL RELEASE Left    CESAREAN [place] N/A 01/27/2018   Procedure: CORONARY STENT INTERVENTION;  Surgeon: Burnell Blanks, MD;  Location: [facility];  Service: Cardiovascular;  Laterality: N/A;   eye implants     FOOT SURGERY Left 2008   front teeth removed after injury     KNEE ARTHROSCOPY WITH LATERAL MENISECTOMY Left 02/02/2019   Procedure: LEFT KNEE ARTHROSCOPY, MEDIAL AND LATERAL MENISECTOMY, EXCISION OF LEFT LATERAL MENISCAL CYST;  Surgeon: Garald Balding, MD;  Location: [facility];  Service: Orthopedics;  Laterality: Left;   LEFT HEART CATH AND CORONARY ANGIOGRAPHY N/A  01/27/2018   Procedure: LEFT HEART CATH AND CORONARY ANGIOGRAPHY;  Surgeon: Burnell Blanks, MD;  Location: [facility];  Service: Cardiovascular;  Laterality: N/A;   pre cancerous lesion removed from forehead     TOTAL ABDOMINAL HYSTERECTOMY W/ BILATERAL SALPINGOOPHORECTOMY  2002   Dr. Willis Modena   VAGINAL HYSTERECTOMY  2001   Social History   Occupational History   Occupation: Retired   Tobacco Use   Smoking status: Every Day    Packs/day: 0.10    Years: 20.00    Pack years: 2.00    Types: Cigarettes   Smokeless tobacco: Never   Tobacco comments:    Restarted smoking earlier this year. smokes during stressful time 3-5 cigs daily  Vaping Use   Vaping Use: Never used  Substance and Sexual Activity   Alcohol use: No    Alcohol/week: 0.0 standard drinks   Drug use: No   Sexual activity: Not on file

## 2021-04-12 DIAGNOSIS — F332 Major depressive disorder, recurrent severe without psychotic features: Secondary | ICD-10-CM | POA: Diagnosis not present

## 2021-04-12 DIAGNOSIS — F1121 Opioid dependence, in remission: Secondary | ICD-10-CM | POA: Diagnosis not present

## 2021-04-12 DIAGNOSIS — F411 Generalized anxiety disorder: Secondary | ICD-10-CM | POA: Diagnosis not present

## 2021-04-12 DIAGNOSIS — F1320 Sedative, hypnotic or anxiolytic dependence, uncomplicated: Secondary | ICD-10-CM | POA: Diagnosis not present

## 2021-04-19 DIAGNOSIS — H43813 Vitreous degeneration, bilateral: Secondary | ICD-10-CM | POA: Diagnosis not present

## 2021-04-19 DIAGNOSIS — H43393 Other vitreous opacities, bilateral: Secondary | ICD-10-CM | POA: Diagnosis not present

## 2021-04-19 DIAGNOSIS — H353212 Exudative age-related macular degeneration, right eye, with inactive choroidal neovascularization: Secondary | ICD-10-CM | POA: Diagnosis not present

## 2021-04-19 DIAGNOSIS — H353221 Exudative age-related macular degeneration, left eye, with active choroidal neovascularization: Secondary | ICD-10-CM | POA: Diagnosis not present

## 2021-05-31 ENCOUNTER — Other Ambulatory Visit: Payer: Self-pay | Admitting: Internal Medicine

## 2021-05-31 ENCOUNTER — Telehealth: Payer: Self-pay | Admitting: Internal Medicine

## 2021-05-31 DIAGNOSIS — F1121 Opioid dependence, in remission: Secondary | ICD-10-CM | POA: Diagnosis not present

## 2021-05-31 DIAGNOSIS — F1320 Sedative, hypnotic or anxiolytic dependence, uncomplicated: Secondary | ICD-10-CM | POA: Diagnosis not present

## 2021-05-31 DIAGNOSIS — F332 Major depressive disorder, recurrent severe without psychotic features: Secondary | ICD-10-CM | POA: Diagnosis not present

## 2021-05-31 DIAGNOSIS — R0602 Shortness of breath: Secondary | ICD-10-CM

## 2021-05-31 DIAGNOSIS — F411 Generalized anxiety disorder: Secondary | ICD-10-CM | POA: Diagnosis not present

## 2021-05-31 NOTE — Telephone Encounter (Signed)
Meghan Schwartz is calling to report she met with the pt this morning and she was telling her she is SOB with minor exertion. Patient's weight was 163 lbs, and she advised her she fluctuates +/- 10 lbs on average. Meghan Schwartz feels the pt's potassium and metoprolol needs to be adjusted. She reports is is alarming that the pt feels sob during the day with saturation in low 80's. Reports pt advised she still wears O2 at 2.5 liters. Patient also told her she is barely eating due to fearing packing on weight, but she confirmed the patient is very aware it is water retention. Requesting patient be called back in regards to this you can contact her if there are any questions.  ?

## 2021-06-01 NOTE — Telephone Encounter (Signed)
Patient has an appt on Mon 4/10 with rheum ?Have her come in to labs (CBC, BMET and BNP)     ?She really needs to have an appt to get seen  Was last in in July 2022 ?

## 2021-06-02 NOTE — Progress Notes (Signed)
? ?Office Visit Note ? ?Patient: Meghan Schwartz             ?Date of Birth: 1950-05-29           ?MRN: [id number]             ?PCP: Shirlean Mylar, MD ?Referring: Shirlean Mylar, MD ?Visit Date: 06/04/2021 ? ? ?Subjective:  ?Rheumatoid Arthritis ([place]) ? ? ?History of Present Illness: Meghan Schwartz is a 71 y.o. female here for follow up for RA on Enbrel 50 mg [place] weekly. She reports varying adherence to treatment plan often missing Enbrel doses and estimates that she takes it about every 10-14 days. She saw Dr. Lajoyce Corners for her painful left toe deformity on February but reports not remembering the plan very well. Foot pain and difficulty walking on this side bothers her daily. Left knee pain and swelling improved after steroid injection but is worse again for the past few weeks. Swelling in the back of the knee bothers her and limits her knee range of movement. ? ?Previous HPI ?03/05/21 ?Meghan Schwartz is a 71 y.o. female here for follow up for RA on Enbrel 50 mg [place] weekly. She also appeared to have considerable osteoarthritis contribution to symptoms in hands and knees as well. Inflammatory markers with mildly elevated CRP of 11.9. She has been overall well except suffered a bite to her right hand from her dog currently on augmentin after ED visit with total of 14 days prescribed. Left knee pain is worse than usual posteriorly with swelling she reports previous baker's cyst has bothered her since she had knee surgery in the past. ?  ?Previous HPI ?02/05/21 ?Meghan Schwartz is a 71 y.o. female her for rheumatoid arthritis on Enbrel 50 mg [place] weekly for which she has been seeing Dr. Nickola Major previously. She was originally diagnosed about 10 or 11 years ago due to development of joint pain, stiffness, synovitis, and progressive joint deformities primarily starting in the feet and knees.  Subsequently proceeded to have increased joint pains involving bilateral hands with some swelling at the PIP and MCP joints.  She started treatment with  Enbrel injections with some improvement in the lower extremity joint pains she recalls still having a lot of pain symptoms and pronounced fatigue that did not clear with this treatment.  Combination treatment was tried with methotrexate with significant GI intolerance.  She tried leflunomide but developed substantial alopecia so discontinued the medicine.  She switched to hydroxychloroquine combination treatment which was being well-tolerated but she has had multiple new or worsening medical problems over the past few years with concern of medication related effects. ?She had hand numbness in the past improved after left carpal tunnel release surgery but she has some residual muscle atrophy in thenar prominence. She had left knee meniscectomy in 2020. ?She suffered NSTEMI in 01/2018 with DES placement in RCA for 99% blockage and has chronic diastolic CHF. Her edema is generally controlled while taking torsemide but does rapidly re accumulate fluid off this medication. ?Ophthalmology evaluation indicated significant visual field deficits findings consistent with age-related macular degeneration of both eyes and recommended avoidance of this medication.  She also has considerable osteoarthritis especially in her knees and feet previous surgical reconstruction on the left foot. Overall she feels her gait is not very stable due to these problems. ?  ?DMARD Hx ?Enbrel - current ?  ?HCQ - macular disease? ?LEF - alopecia ?MTX - GI intolerance.  ? ? ?Review of Systems  ?  Constitutional:  Positive for fatigue.  ?HENT:  Positive for mouth dryness.   ?Eyes:  Negative for dryness.  ?Respiratory:  Positive for shortness of breath.   ?Cardiovascular:  Positive for swelling in legs/feet.  ?Gastrointestinal:  Negative for constipation.  ?Endocrine: Positive for excessive thirst and increased urination.  ?Genitourinary:  Negative for difficulty urinating.  ?Musculoskeletal:  Positive for joint pain, gait problem, joint pain, joint  swelling, muscle weakness, morning stiffness and muscle tenderness.  ?Skin:  Negative for rash.  ?Allergic/Immunologic: Negative for susceptible to infections.  ?Neurological:  Positive for weakness.  ?Hematological:  Positive for bruising/bleeding tendency.  ?Psychiatric/Behavioral:  Negative for sleep disturbance.   ? ?PMFS History:  ?Patient Active Problem List  ? Diagnosis Date Noted  ? Dog bite 03/05/2021  ? High risk medication use 02/05/2021  ? Dry mouth 02/05/2021  ? Pain in left foot 01/31/2021  ? Bunion of great toe of right foot 06/15/2020  ? Hypoxia 03/05/2019  ? Derangement of posterior horn of medial meniscus 02/02/2019  ? Derangement of posterior horn of lateral meniscus 02/02/2019  ? Meniscal cyst, unspecified laterality 12/01/2018  ? Torn medial meniscus 12/01/2018  ? Primary osteoarthritis of left knee 12/01/2018  ? Coronary artery disease involving native coronary artery of native heart without angina pectoris 09/14/2018  ? Chronic diastolic (congestive) heart failure (HCC)   ? Chronic pain of left knee 05/15/2018  ? Mass of left knee 05/15/2018  ? Non-ST elevation (NSTEMI) myocardial infarction [facility])   ? Chest pain 01/25/2018  ? RA (rheumatoid arthritis) (HCC) 01/25/2018  ? Hyperlipidemia 01/25/2018  ? Hypokalemia 01/25/2018  ? Depression with anxiety 01/25/2018  ? Tobacco abuse 01/25/2018  ? Lower extremity cellulitis 01/25/2018  ? Benzodiazepine dependence (HCC)   ? MDD (major depressive disorder), recurrent severe, without psychosis (HCC) 10/02/2017  ? CTS (carpal tunnel syndrome) 06/08/2014  ?  ?Past Medical History:  ?Diagnosis Date  ? Allergic rhinitis   ? Anxiety   ? Benzodiazepine dependence (HCC)   ? Bruxism   ? CAD (coronary artery disease)   ? S/p NSTEMI 01/2018 >> LHC: oLAD 20, pLAD 20; oD1 20, oLCx 40, pRCA 99 >> PCI: DES to prox RCA // Echo 01/2018: EF 50-55; prob inf-lat and inf HK  ? Chronic diastolic heart failure   ? Echocardiogram 07/2018: EF 55-60, grade 1 diastolic  dysfunction, normal wall motion, normal GLS (-22.3), PASP 28  ? Chronic foot pain   ? bunions, torn ligaments-on chronic pain medication  ? CTS (carpal tunnel syndrome) 06/08/2014  ? Depression   ? High cholesterol   ? Hypertension   ? MDD (major depressive disorder) 10/02/2017  ? Migraine   ? Myocardial infarction (HCC) 02/01/2018  ? Pneumonia 1986  ? left lung  ? RA (rheumatoid arthritis) (HCC)   ?  ?Family History  ?Problem Relation Age of Onset  ? Thyroid disease Mother   ? Breast cancer Mother   ? Parkinson's disease Father   ? Raynaud syndrome Maternal Aunt   ? Arthritis Maternal Grandmother   ? Heart attack Maternal Grandmother   ? Stroke Paternal Grandmother   ? Heart attack Paternal Grandfather   ? ?Past Surgical History:  ?Procedure Laterality Date  ? BTL  2000  ? CARPAL TUNNEL RELEASE Left   ? CESAREAN SECTION  1978. 1983  ? CORONARY STENT INTERVENTION N/A 01/27/2018  ? Procedure: CORONARY STENT INTERVENTION;  Surgeon: Kathleene Hazel, MD;  Location: [facility];  Service: Cardiovascular;  Laterality: N/A;  ?  eye implants    ? FOOT SURGERY Left 2008  ? front teeth removed after injury    ? KNEE ARTHROSCOPY WITH LATERAL MENISECTOMY Left 02/02/2019  ? Procedure: LEFT KNEE ARTHROSCOPY, MEDIAL AND LATERAL MENISECTOMY, EXCISION OF LEFT LATERAL MENISCAL CYST;  Surgeon: Valeria Batman, MD;  Location: [facility];  Service: Orthopedics;  Laterality: Left;  ? LEFT HEART CATH AND CORONARY ANGIOGRAPHY N/A 01/27/2018  ? Procedure: LEFT HEART CATH AND CORONARY ANGIOGRAPHY;  Surgeon: Kathleene Hazel, MD;  Location: [facility];  Service: Cardiovascular;  Laterality: N/A;  ? pre cancerous lesion removed from forehead    ? TOTAL ABDOMINAL HYSTERECTOMY W/ BILATERAL SALPINGOOPHORECTOMY  2002  ? Dr. Jackelyn Knife  ? VAGINAL HYSTERECTOMY  2001  ? ?Social History  ? ?Social History Narrative  ? Lives at home with spouse.   ? Right handed.  ? Caffeine use: 2 cups coffee/day   ? 8 oz soda/day    ? ?Immunization History  ?Administered Date(s) Administered  ? PFIZER(Purple Top)SARS-COV-2 Vaccination 04/10/2019, 05/05/2019, 10/19/2019, 06/20/2020  ? 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 Tdap 02/

## 2021-06-04 ENCOUNTER — Ambulatory Visit: Payer: Medicare PPO | Admitting: Internal Medicine

## 2021-06-04 ENCOUNTER — Encounter: Payer: Self-pay | Admitting: Internal Medicine

## 2021-06-04 VITALS — BP 128/73 | HR 80 | Resp 16 | Ht 62.0 in | Wt 161.0 lb

## 2021-06-04 DIAGNOSIS — Z79899 Other long term (current) drug therapy: Secondary | ICD-10-CM | POA: Diagnosis not present

## 2021-06-04 DIAGNOSIS — M06862 Other specified rheumatoid arthritis, left knee: Secondary | ICD-10-CM | POA: Diagnosis not present

## 2021-06-04 DIAGNOSIS — M069 Rheumatoid arthritis, unspecified: Secondary | ICD-10-CM | POA: Diagnosis not present

## 2021-06-04 DIAGNOSIS — M1712 Unilateral primary osteoarthritis, left knee: Secondary | ICD-10-CM

## 2021-06-04 NOTE — Telephone Encounter (Signed)
Patient was returning call. Please advise ?

## 2021-06-04 NOTE — Telephone Encounter (Signed)
Left message for patient to call back  

## 2021-06-04 NOTE — Telephone Encounter (Signed)
Left message for patient to return call.

## 2021-06-05 LAB — CBC WITH DIFFERENTIAL/PLATELET
Absolute Monocytes: 391 cells/uL (ref 200–950)
Basophils Absolute: 21 cells/uL (ref 0–200)
Basophils Relative: 0.5 %
Eosinophils Absolute: 151 cells/uL (ref 15–500)
Eosinophils Relative: 3.6 %
HCT: 38.3 % (ref 35.0–45.0)
Hemoglobin: 12.5 g/dL (ref 11.7–15.5)
Lymphs Abs: 1226 cells/uL (ref 850–3900)
MCH: 26 pg — ABNORMAL LOW (ref 27.0–33.0)
MCHC: 32.6 g/dL (ref 32.0–36.0)
MCV: 79.8 fL — ABNORMAL LOW (ref 80.0–100.0)
MPV: 9.2 fL (ref 7.5–12.5)
Monocytes Relative: 9.3 %
Neutro Abs: 2411 cells/uL (ref 1500–7800)
Neutrophils Relative %: 57.4 %
Platelets: 195 10*3/uL (ref 140–400)
RBC: 4.8 10*6/uL (ref 3.80–5.10)
RDW: 12.9 % (ref 11.0–15.0)
Total Lymphocyte: 29.2 %
WBC: 4.2 10*3/uL (ref 3.8–10.8)

## 2021-06-05 LAB — COMPLETE METABOLIC PANEL WITH GFR
AG Ratio: 1.4 (calc) (ref 1.0–2.5)
ALT: 14 U/L (ref 6–29)
AST: 24 U/L (ref 10–35)
Albumin: 4.1 g/dL (ref 3.6–5.1)
Alkaline phosphatase (APISO): 156 U/L — ABNORMAL HIGH (ref 37–153)
BUN: 15 mg/dL (ref 7–25)
CO2: 35 mmol/L — ABNORMAL HIGH (ref 20–32)
Calcium: 9.4 mg/dL (ref 8.6–10.4)
Chloride: 99 mmol/L (ref 98–110)
Creat: 0.93 mg/dL (ref 0.60–1.00)
Globulin: 3 g/dL (calc) (ref 1.9–3.7)
Glucose, Bld: 99 mg/dL (ref 65–99)
Potassium: 4.2 mmol/L (ref 3.5–5.3)
Sodium: 143 mmol/L (ref 135–146)
Total Bilirubin: 1 mg/dL (ref 0.2–1.2)
Total Protein: 7.1 g/dL (ref 6.1–8.1)
eGFR: 66 mL/min/{1.73_m2} (ref >=60)

## 2021-06-05 LAB — C-REACTIVE PROTEIN: CRP: 21.7 mg/L — ABNORMAL HIGH (ref <8.0)

## 2021-06-05 MED ORDER — LIDOCAINE HCL 1 % IJ SOLN
4.0000 mL | INTRAMUSCULAR | Status: AC | PRN
  Administered 2021-06-04: 4 mL

## 2021-06-05 MED ORDER — TRIAMCINOLONE ACETONIDE 40 MG/ML IJ SUSP
40.0000 mg | INTRAMUSCULAR | Status: AC | PRN
  Administered 2021-06-04: 40 mg via INTRA_ARTICULAR

## 2021-06-05 NOTE — Telephone Encounter (Signed)
Attempted to call pt back and go no answer. [facility] counseling center back to see if they have alternate contact information for patient and they do not. She advised that they will call patient and leave a message for her to call us back. Per Vikki Ports, pt can be hard to get ahold of and they often have a hard time getting her on the phone. Will reattempt to call patient to relay recommendation of Dr Tenny Craw for blood work and need for appt. ?

## 2021-06-05 NOTE — Progress Notes (Signed)
Labs look okay for continuing the Enbrel for her RA. Her CRP is increased more so joint inflammation could be coming from some RA flare up. I recommend she try to improve taking Enbrel more consistently once per week instead of waiting for joint pain. To try increasing this before we consider any change in medication.

## 2021-06-05 NOTE — Telephone Encounter (Signed)
Pt called in returning call to [place], KRISTIE  ? ?Best number [phone] ? ?

## 2021-06-06 NOTE — Telephone Encounter (Signed)
Spoke with pt and has been under a lot of stress but this has improved Per pt O2 sat today has been running 97% Per pt in last week was in the 80's  Per pt stress is not as bad at this time it  has calmed down  Pt currently taking Torsemide 20 mg 2 tabs every am Pt weight yesterday at Dr Dimple Casey office was 160 Per pt feels has some swelling in belly  Appt made   for 06/19/21 at 2:30 pm with Dr Tenny Craw Will forward to to Dr Tenny Craw for review ./cy ?

## 2021-06-07 DIAGNOSIS — F1320 Sedative, hypnotic or anxiolytic dependence, uncomplicated: Secondary | ICD-10-CM | POA: Diagnosis not present

## 2021-06-07 DIAGNOSIS — F411 Generalized anxiety disorder: Secondary | ICD-10-CM | POA: Diagnosis not present

## 2021-06-07 DIAGNOSIS — F1121 Opioid dependence, in remission: Secondary | ICD-10-CM | POA: Diagnosis not present

## 2021-06-07 DIAGNOSIS — F332 Major depressive disorder, recurrent severe without psychotic features: Secondary | ICD-10-CM | POA: Diagnosis not present

## 2021-06-07 NOTE — Telephone Encounter (Signed)
Agree with plan  Would get labs next Tuesday  (BMET and BNP) ?

## 2021-06-07 NOTE — Telephone Encounter (Signed)
Left message to call back.  Need to schedule labs.  ?Orders placed. ?

## 2021-06-07 NOTE — Addendum Note (Signed)
Addended by: Julio Sicks on: 06/07/2021 03:21 PM ? ? Modules accepted: Orders ? ?

## 2021-06-10 ENCOUNTER — Other Ambulatory Visit: Payer: Self-pay | Admitting: Internal Medicine

## 2021-06-13 ENCOUNTER — Other Ambulatory Visit: Payer: Medicare PPO

## 2021-06-13 DIAGNOSIS — R0602 Shortness of breath: Secondary | ICD-10-CM | POA: Diagnosis not present

## 2021-06-13 NOTE — Telephone Encounter (Signed)
Pt to have labs today: BMET and PRO BNP.  ?

## 2021-06-14 DIAGNOSIS — H353212 Exudative age-related macular degeneration, right eye, with inactive choroidal neovascularization: Secondary | ICD-10-CM | POA: Diagnosis not present

## 2021-06-14 DIAGNOSIS — H43813 Vitreous degeneration, bilateral: Secondary | ICD-10-CM | POA: Diagnosis not present

## 2021-06-14 DIAGNOSIS — H43393 Other vitreous opacities, bilateral: Secondary | ICD-10-CM | POA: Diagnosis not present

## 2021-06-14 DIAGNOSIS — H353221 Exudative age-related macular degeneration, left eye, with active choroidal neovascularization: Secondary | ICD-10-CM | POA: Diagnosis not present

## 2021-06-14 LAB — PRO B NATRIURETIC PEPTIDE: NT-Pro BNP: 139 pg/mL (ref 0–301)

## 2021-06-14 LAB — BASIC METABOLIC PANEL
BUN/Creatinine Ratio: 22 (ref 12–28)
BUN: 21 mg/dL (ref 8–27)
CO2: 27 mmol/L (ref 20–29)
Calcium: 9.5 mg/dL (ref 8.7–10.3)
Chloride: 99 mmol/L (ref 96–106)
Creatinine, Ser: 0.94 mg/dL (ref 0.57–1.00)
Glucose: 91 mg/dL (ref 70–99)
Potassium: 4.1 mmol/L (ref 3.5–5.2)
Sodium: 140 mmol/L (ref 134–144)
eGFR: 65 mL/min/{1.73_m2} (ref >59)

## 2021-06-18 NOTE — Progress Notes (Addendum)
? ?Cardiology Office Note ? ? ?Date:  06/20/2021  ? ?ID:  Meghan Schwartz, DOB 1950-06-16, MRN [id number] ? ?PCP:  Pcp, No  ?Cardiologist:   Dorris Carnes, MD  ? ?F/U of CAD   ?  ?History of Present Illness: ? ?Meghan Schwartz is a 71 y.o. female with a hx of CAD   She is s/p NSTEMI in 01/2018   Underwent DES to RCA (99 to ) 20% LAD  40% LCx   Also has chronic diastolic CHF, RA and HL   Echo had been done that showed normal LVEF    ? ?I saw the pt in clinic in July 2022    She had and echocardiogram in July that showed LVEF normal   Mild diastolic dysfunction.   ?She called in in early Aug feeling very bad, weak.   Metalozone stopped ? ? ?Since I saw her she says her biggest problems is her eyes.   Has lost vision in one eye and it is limited in another    Pt says it is due to hydroxychloroquine  use for rheum problem    She is being seen by a retinal specialist now    ? ?Uses O2 at night   Breathing is stable unless gets nervous or rushes  ? ?The pt called in early April complained  of SOB   Said that her wt was up        Also noted swelling in leg, possibly belly    O2 sats one week wer in 80s   Pt scheduled for labs (BMET, BNP)   Pt says sats have improved    Back in 90s   ? ?One meal per day    Low fat   hamberger with cheese on rye bread ? ?Current Meds  ?Medication Sig  ? acetaminophen (TYLENOL) 500 MG tablet Take 500 mg by mouth every 6 (six) hours as needed (For knee pain).  ? Cetirizine HCl (ZYRTEC PO) Take 1 capsule by mouth daily.  ? cholecalciferol (VITAMIN D3) 25 MCG (1000 UT) tablet Take 1,000 Units by mouth daily.  ? clonazePAM (KLONOPIN) 1 MG tablet Take 0.5-1 mg by mouth See admin instructions. 0.5 mg midday, 1 mg at bedtime  ? cloNIDine (CATAPRES) 0.1 MG tablet Take 0.1 mg by mouth daily.  ? clopidogrel (PLAVIX) 75 MG tablet TAKE 1 TABLET BY MOUTH EVERY DAY  ? escitalopram (LEXAPRO) 10 MG tablet Take 10 mg by mouth daily.   ? estradiol (VIVELLE-DOT) 0.075 MG/24HR Place 1 patch onto the skin 2 (two) times a  week. Tuesday and Friday  ? etanercept (ENBREL SURECLICK) 50 MG/ML injection Inject 50 mg into the skin once a week.  ? hydrOXYzine (ATARAX) 25 MG tablet Take 25-50 mg by mouth 2 (two) times daily as needed.  ? KLOR-CON M20 20 MEQ tablet TAKE 2 TABLETS (40 MEQ TOTAL) BY MOUTH 3 (THREE) TIMES DAILY.  ? metoprolol tartrate (LOPRESSOR) 25 MG tablet TAKE 1 TABLET BY MOUTH EVERY DAY  ? Multiple Vitamins-Minerals (MULTIVITAMIN WITH MINERALS) tablet Take 1 tablet by mouth daily.  ? nitroGLYCERIN (NITROSTAT) 0.4 MG SL tablet Place 1 tablet (0.4 mg total) under the tongue every 5 (five) minutes as needed for chest pain.  ? ondansetron (ZOFRAN-ODT) 4 MG disintegrating tablet Take 4 mg by mouth as needed.  ? rosuvastatin (CRESTOR) 20 MG tablet TAKE 1 TABLET BY MOUTH EVERY DAY  ? torsemide (DEMADEX) 20 MG tablet Take 1 tablet (20 mg total) by mouth 2 (two)  times daily.  ? triamcinolone cream (KENALOG) 0.1 % Apply topically 2 (two) times daily.  ? ZUBSOLV 5.7-1.4 MG SUBL Place 1 tablet under the tongue 3 (three) times daily.  ? ? ? ?Allergies:   Erythromycin  ? ?Past Medical History:  ?Diagnosis Date  ? Allergic rhinitis   ? Anxiety   ? Benzodiazepine dependence ([place])   ? Bruxism   ? CAD (coronary artery disease)   ? S/p NSTEMI 01/2018 >> LHC: oLAD 20, pLAD 20; oD1 20, oLCx 40, pRCA 99 >> PCI: DES to prox RCA // Echo 01/2018: EF 50-55; prob inf-lat and inf HK  ? Chronic diastolic heart failure   ? Echocardiogram 07/2018: EF 123456, grade 1 diastolic dysfunction, normal wall motion, normal GLS (-22.3), PASP 28  ? Chronic foot pain   ? bunions, torn ligaments-on chronic pain medication  ? CTS (carpal tunnel syndrome) 06/08/2014  ? Depression   ? High cholesterol   ? Hypertension   ? MDD (major depressive disorder) 10/02/2017  ? Migraine   ? Myocardial infarction ([place]) 02/01/2018  ? Pneumonia 1986  ? left lung  ? RA (rheumatoid arthritis) ([place])   ? ? ?Past Surgical History:  ?Procedure Laterality Date  ? BTL  2000  ? CARPAL TUNNEL  RELEASE Left   ? [place]  ? CORONARY STENT INTERVENTION N/A 01/27/2018  ? Procedure: CORONARY STENT INTERVENTION;  Surgeon: Burnell Blanks, MD;  Location: [facility];  Service: Cardiovascular;  Laterality: N/A;  ? eye implants    ? FOOT SURGERY Left 2008  ? front teeth removed after injury    ? KNEE ARTHROSCOPY WITH LATERAL MENISECTOMY Left 02/02/2019  ? Procedure: LEFT KNEE ARTHROSCOPY, MEDIAL AND LATERAL MENISECTOMY, EXCISION OF LEFT LATERAL MENISCAL CYST;  Surgeon: Garald Balding, MD;  Location: [facility];  Service: Orthopedics;  Laterality: Left;  ? LEFT HEART CATH AND CORONARY ANGIOGRAPHY N/A 01/27/2018  ? Procedure: LEFT HEART CATH AND CORONARY ANGIOGRAPHY;  Surgeon: Burnell Blanks, MD;  Location: [facility];  Service: Cardiovascular;  Laterality: N/A;  ? pre cancerous lesion removed from forehead    ? TOTAL ABDOMINAL HYSTERECTOMY W/ BILATERAL SALPINGOOPHORECTOMY  2002  ? Dr. Willis Modena  ? VAGINAL HYSTERECTOMY  2001  ? ? ? ?Social History:  The patient  reports that she has been smoking cigarettes. She has a 2.00 pack-year smoking history. She has never used smokeless tobacco. She reports that she does not drink alcohol and does not use drugs.  ? ?Family History:  The patient's family history includes Arthritis in her maternal grandmother; Breast cancer in her mother; Heart attack in her maternal grandmother and paternal grandfather; Parkinson's disease in her father; Raynaud syndrome in her maternal aunt; Stroke in her paternal grandmother; Thyroid disease in her mother.  ? ? ?ROS:  Please see the history of present illness. All other systems are reviewed and  Negative to the above problem except as noted.  ? ? ?PHYSICAL EXAM: ?VS:  BP 122/70   Pulse 82   Ht 5\' 2"  (1.575 m)   Wt 159 lb (72.1 kg)   SpO2 93%   BMI 29.08 kg/m?   ?GEN: Well nourished, well developed, in no acute distress  ?HEENT: normal  ?Neck: no JVD,  ?Cardiac: RRR; no murmurs;    NO LE   edema  ?Respiratory:  clear to auscultation bilaterally,  ?GI: soft, nontender, nondistended, + BS  No hepatomegaly  ?MS: no deformity Moving all extremities   ?  Skin: warm and dry, no rash ?Neuro: no gross defects  ?Psych: euthymic mood, full affect ? ? ?EKG:  EKG is  ordered today.    SR 82 bpm   Occasional PVC   Sl ST depression inferoaterally  New ?Echo:  July 2022 ? ? ? 1. Left ventricular ejection fraction, by estimation, is 60 to 65%. The  ?left ventricle has normal function. The left ventricle has no regional  ?wall motion abnormalities. There is mild asymmetric left ventricular  ?hypertrophy of the basal-septal segment.  ?Left ventricular diastolic parameters are consistent with Grade I  ?diastolic dysfunction (impaired relaxation).  ? 2. Right ventricular systolic function is normal. The right ventricular  ?size is normal. There is normal pulmonary artery systolic pressure.  ? 3. The mitral valve is normal in structure. No evidence of mitral valve  ?regurgitation. No evidence of mitral stenosis.  ? 4. The aortic valve is tricuspid. Aortic valve regurgitation is not  ?visualized. No aortic stenosis is present.  ? 5. The inferior vena cava is normal in size with greater than 50%  ?respiratory variability, suggesting right atrial pressure of 3 mmHg.  ? ? ?Lipid Panel ?   ?Component Value Date/Time  ? CHOL 171 02/04/2020 1652  ? TRIG 88 02/04/2020 1652  ? HDL 67 02/04/2020 1652  ? CHOLHDL 2.6 02/04/2020 1652  ? CHOLHDL 2.7 01/28/2018 0353  ? VLDL 16 01/28/2018 0353  ? [place] 88 02/04/2020 1652  ? ?  ? ?Wt Readings from Last 3 Encounters:  ?06/19/21 159 lb (72.1 kg)  ?06/04/21 161 lb (73 kg)  ?03/05/21 161 lb (73 kg)  ?  ? ? ?ASSESSMENT AND PLAN: ? ? ?1 [place] diastolic CHF   Volume status today looks good    Labs from 06/13/21 BNP was normal, electrolytes good     ?Talking to the pt today the swelling was around L knee  Not in calves/feet.    I would follow for now   Keep on same meds  ?I question if some of  problem is primarily pulmonary, possibly related to rheumatologic condition    ?May be worthwhile t oset up for PFTs   ? ?2 CAD  s/p DES to RCA in 2019   Pt remains asymptomatic    Follow   I am not convinced dy

## 2021-06-19 ENCOUNTER — Ambulatory Visit: Payer: Medicare PPO | Admitting: Internal Medicine

## 2021-06-19 ENCOUNTER — Encounter: Payer: Self-pay | Admitting: Internal Medicine

## 2021-06-19 VITALS — BP 122/70 | HR 82 | Ht 62.0 in | Wt 159.0 lb

## 2021-06-19 DIAGNOSIS — Z79899 Other long term (current) drug therapy: Secondary | ICD-10-CM

## 2021-06-19 DIAGNOSIS — E782 Mixed hyperlipidemia: Secondary | ICD-10-CM

## 2021-06-19 DIAGNOSIS — E876 Hypokalemia: Secondary | ICD-10-CM

## 2021-06-19 NOTE — Patient Instructions (Signed)
Medication Instructions:  ?Your physician recommends that you continue on your current medications as directed. Please refer to the Current Medication list given to you today. ? ?*If you need a refill on your cardiac medications before your next appointment, please call your pharmacy* ? ? ?Lab Work: ?NMR ? ?If you have labs (blood work) drawn today and your tests are completely normal, you will receive your results only by: ?MyChart Message (if you have MyChart) OR ?A paper copy in the mail ?If you have any lab test that is abnormal or we need to change your treatment, we will call you to review the results. ? ? ?Testing/Procedures: ? ? ? ?Follow-Up: ?At [facility], you and your health needs are our priority.  As part of our continuing mission to provide you with exceptional heart care, we have created designated Provider Care Teams.  These Care Teams include your primary Cardiologist (physician) and Advanced Practice Providers (APPs -  Physician Assistants and Nurse Practitioners) who all work together to provide you with the care you need, when you need it. ? ?We recommend signing up for the patient portal called "MyChart".  Sign up information is provided on this After Visit Summary.  MyChart is used to connect with patients for Virtual Visits (Telemedicine).  Patients are able to view lab/test results, encounter notes, upcoming appointments, etc.  Non-urgent messages can be sent to your provider as well.   ?To learn more about what you can do with MyChart, go to [email or web address].   ? ? ?Important Information About Sugar ? ? ? ? ?  ?

## 2021-06-21 LAB — NMR, LIPOPROFILE
Cholesterol, Total: 160 mg/dL (ref 100–199)
HDL Particle Number: 42.6 umol/L (ref >=30.5)
HDL-C: 56 mg/dL (ref >39)
LDL Particle Number: 1065 nmol/L — ABNORMAL HIGH (ref <1000)
LDL Size: 20.7 nm (ref >20.5)
LDL-C (NIH Calc): 82 mg/dL (ref 0–99)
LP-IR Score: 73 — ABNORMAL HIGH (ref <=45)
Small LDL Particle Number: 596 nmol/L — ABNORMAL HIGH (ref <=527)
Triglycerides: 122 mg/dL (ref 0–149)

## 2021-06-22 ENCOUNTER — Other Ambulatory Visit: Payer: Self-pay | Admitting: Internal Medicine

## 2021-06-25 ENCOUNTER — Telehealth: Payer: Self-pay

## 2021-06-25 DIAGNOSIS — E782 Mixed hyperlipidemia: Secondary | ICD-10-CM

## 2021-06-25 DIAGNOSIS — Z79899 Other long term (current) drug therapy: Secondary | ICD-10-CM

## 2021-06-25 MED ORDER — ROSUVASTATIN CALCIUM 40 MG PO TABS: 40.0000 mg | ORAL_TABLET | Freq: Every day | ORAL | 3 refills | Status: DC

## 2021-06-25 NOTE — Telephone Encounter (Signed)
-----   Message from Pricilla Riffle, MD sent at 06/22/2021 12:00 AM EDT ----- ?With CAD  LDL should be lower than 82 ?I would recomm increasing Crestor to 40 mg    ?F/U lipomed in 10 wks  ?

## 2021-06-25 NOTE — Telephone Encounter (Signed)
My Chart sent to the pt... new RX sent in for the pt.  ?

## 2021-06-27 NOTE — Telephone Encounter (Signed)
Left a message for the pt to call back.  

## 2021-07-10 ENCOUNTER — Other Ambulatory Visit: Payer: Self-pay | Admitting: *Deleted

## 2021-07-10 ENCOUNTER — Other Ambulatory Visit: Payer: Self-pay | Admitting: Internal Medicine

## 2021-07-10 DIAGNOSIS — Z111 Encounter for screening for respiratory tuberculosis: Secondary | ICD-10-CM | POA: Diagnosis not present

## 2021-07-10 DIAGNOSIS — Z9225 Personal history of immunosupression therapy: Secondary | ICD-10-CM | POA: Diagnosis not present

## 2021-07-10 NOTE — Telephone Encounter (Signed)
Next Visit: 09/18/2021 ? ?Last Visit: 06/04/2021 ? ?Last Fill: 03/14/2021 ? ?[id number] arthritis, involving unspecified site, unspecified whether rheumatoid factor present  ? ?Current Dose per office note 06/04/2021: Enbrel 50 mg [place] weekly. ? ?Labs: 06/04/2021 Labs look okay for continuing the Enbrel for her RA. ? ?TB Gold: 07/08/2020 Neg  ? ?Patient advised she is due to update her TB Gold.  ? ?Okay to refill Enbrel?  ?

## 2021-07-13 LAB — QUANTIFERON-TB GOLD PLUS
Mitogen-NIL: >10 IU/mL
NIL: 0.06 IU/mL
QuantiFERON-TB Gold Plus: NEGATIVE
TB1-NIL: 0 IU/mL
TB2-NIL: 0.05 IU/mL

## 2021-07-15 NOTE — Progress Notes (Signed)
TB screening test is negative no problem for Enbrel.

## 2021-07-18 DIAGNOSIS — L03116 Cellulitis of left lower limb: Secondary | ICD-10-CM | POA: Diagnosis not present

## 2021-07-18 DIAGNOSIS — N3946 Mixed incontinence: Secondary | ICD-10-CM | POA: Diagnosis not present

## 2021-07-18 DIAGNOSIS — S8010XA Contusion of unspecified lower leg, initial encounter: Secondary | ICD-10-CM | POA: Diagnosis not present

## 2021-07-24 ENCOUNTER — Telehealth: Payer: Self-pay | Admitting: Internal Medicine

## 2021-07-24 NOTE — Telephone Encounter (Signed)
Left a message for the pt to call back... letter sent to the pt.

## 2021-07-24 NOTE — Telephone Encounter (Signed)
Pt notified of results and recommendations.  She has picked up Crestor 40 mg and began the increased dose on 06/27/21.  I scheduled her follow up lab appointment and asked her to disregard the letter that was sent today.

## 2021-07-24 NOTE — Telephone Encounter (Signed)
Pt returning call regarding test results. Please advise ?

## 2021-07-24 NOTE — Telephone Encounter (Signed)
Pt notified of results and recommendations.  She has picked up Crestor 40 mg and began the increased dose on 06/27/21.  I scheduled her follow up lab appointment and asked her to disregard the letter that was sent today. 

## 2021-07-26 NOTE — Progress Notes (Signed)
Office Visit Note  Patient: Meghan Schwartz             Date of Birth: August 03, 1950           MRN: [id number]             PCP: Pcp, No Referring: Shirlean Mylar, MD Visit Date: 08/07/2021   Subjective:  Follow-up ([place])   History of Present Illness: Meghan Schwartz is a 71 y.o. female here for follow up for RA on Enbrel 50 mg [place] weekly. She has made an effort to taking her Enbrel consistently every week. Her knee pain improved after the last steroid injection about 2 months ago which was her worst arthritis complaint. She continues having trouble with her feet and toes limiting wearing a variety of shoes and affecting her walking. Based on last follow up with Dr. Lajoyce Corners she does not anticipate any additional surgery or procedure intervention for this coming up.   Previous HPI 06/04/2021 Meghan Schwartz is a 71 y.o. female here for follow up for RA on Enbrel 50 mg [place] weekly. She reports varying adherence to treatment plan often missing Enbrel doses and estimates that she takes it about every 10-14 days. She saw Dr. Lajoyce Corners for her painful left toe deformity on February but reports not remembering the plan very well. Foot pain and difficulty walking on this side bothers her daily. Left knee pain and swelling improved after steroid injection but is worse again for the past few weeks. Swelling in the back of the knee bothers her and limits her knee range of movement.   Previous HPI 03/05/21 Meghan Schwartz is a 71 y.o. female here for follow up for RA on Enbrel 50 mg [place] weekly. She also appeared to have considerable osteoarthritis contribution to symptoms in hands and knees as well. Inflammatory markers with mildly elevated CRP of 11.9. She has been overall well except suffered a bite to her right hand from her dog currently on augmentin after ED visit with total of 14 days prescribed. Left knee pain is worse than usual posteriorly with swelling she reports previous baker's cyst has bothered her since she had knee  surgery in the past.   Previous HPI 02/05/21 Meghan Schwartz is a 71 y.o. female her for rheumatoid arthritis on Enbrel 50 mg [place] weekly for which she has been seeing Dr. Nickola Major previously. She was originally diagnosed about 10 or 11 years ago due to development of joint pain, stiffness, synovitis, and progressive joint deformities primarily starting in the feet and knees.  Subsequently proceeded to have increased joint pains involving bilateral hands with some swelling at the PIP and MCP joints.  She started treatment with Enbrel injections with some improvement in the lower extremity joint pains she recalls still having a lot of pain symptoms and pronounced fatigue that did not clear with this treatment.  Combination treatment was tried with methotrexate with significant GI intolerance.  She tried leflunomide but developed substantial alopecia so discontinued the medicine.  She switched to hydroxychloroquine combination treatment which was being well-tolerated but she has had multiple new or worsening medical problems over the past few years with concern of medication related effects. She had hand numbness in the past improved after left carpal tunnel release surgery but she has some residual muscle atrophy in thenar prominence. She had left knee meniscectomy in 2020. She suffered NSTEMI in 01/2018 with DES placement in RCA for 99% blockage and has chronic diastolic CHF. Her edema  is generally controlled while taking torsemide but does rapidly re accumulate fluid off this medication. Ophthalmology evaluation indicated significant visual field deficits findings consistent with age-related macular degeneration of both eyes and recommended avoidance of this medication.  She also has considerable osteoarthritis especially in her knees and feet previous surgical reconstruction on the left foot. Overall she feels her gait is not very stable due to these problems.   DMARD Hx Enbrel - current   HCQ - macular  disease? LEF - alopecia MTX - GI intolerance.    Review of Systems  Constitutional:  Positive for fatigue.  HENT:  Positive for mouth dryness.   Eyes:  Positive for dryness.  Respiratory:  Positive for shortness of breath.   Cardiovascular:  Positive for swelling in legs/feet.  Gastrointestinal:  Negative for constipation.  Endocrine: Positive for cold intolerance, heat intolerance, excessive thirst and increased urination.  Genitourinary:  Negative for difficulty urinating.  Musculoskeletal:  Positive for joint pain, gait problem, joint pain, joint swelling, muscle weakness, morning stiffness and muscle tenderness.  Skin:  Positive for rash.  Allergic/Immunologic: Positive for susceptible to infections.  Neurological:  Positive for weakness.  Hematological:  Positive for bruising/bleeding tendency.  Psychiatric/Behavioral:  Negative for sleep disturbance.     PMFS History:  Patient Active Problem List   Diagnosis Date Noted   Claw toe, left 08/07/2021   Dog bite 03/05/2021   High risk medication use 02/05/2021   Dry mouth 02/05/2021   Pain in left foot 01/31/2021   Bunion of great toe of right foot 06/15/2020   Hypoxia 03/05/2019   Derangement of posterior horn of medial meniscus 02/02/2019   Derangement of posterior horn of lateral meniscus 02/02/2019   Meniscal cyst, unspecified laterality 12/01/2018   Torn medial meniscus 12/01/2018   Primary osteoarthritis of left knee 12/01/2018   Coronary artery disease involving native coronary artery of native heart without angina pectoris 09/14/2018   Chronic diastolic (congestive) heart failure (HCC)    Chronic pain of left knee 05/15/2018   Mass of left knee 05/15/2018   Non-ST elevation (NSTEMI) myocardial infarction [facility])    Chest pain 01/25/2018   RA (rheumatoid arthritis) (HCC) 01/25/2018   Hyperlipidemia 01/25/2018   Hypokalemia 01/25/2018   Depression with anxiety 01/25/2018   Tobacco abuse 01/25/2018   Lower  extremity cellulitis 01/25/2018   Benzodiazepine dependence (HCC)    MDD (major depressive disorder), recurrent severe, without psychosis (HCC) 10/02/2017   CTS (carpal tunnel syndrome) 06/08/2014    Past Medical History:  Diagnosis Date   Allergic rhinitis    Anxiety    Benzodiazepine dependence (HCC)    Bruxism    CAD (coronary artery disease)    S/p NSTEMI 01/2018 >> LHC: oLAD 20, pLAD 20; oD1 20, oLCx 40, pRCA 99 >> PCI: DES to prox RCA // Echo 01/2018: EF 50-55; prob inf-lat and inf HK   Chronic diastolic heart failure    Echocardiogram 07/2018: EF 55-60, grade 1 diastolic dysfunction, normal wall motion, normal GLS (-22.3), PASP 28   Chronic foot pain    bunions, torn ligaments-on chronic pain medication   CTS (carpal tunnel syndrome) 06/08/2014   Depression    High cholesterol    Hypertension    MDD (major depressive disorder) 10/02/2017   Migraine    Myocardial infarction (HCC) 02/01/2018   Pneumonia 1986   left lung   RA (rheumatoid arthritis) (HCC)     Family History  Problem Relation Age of Onset   Thyroid disease Mother  Breast cancer Mother    Parkinson's disease Father    Raynaud syndrome Maternal Aunt    Arthritis Maternal Grandmother    Heart attack Maternal Grandmother    Stroke Paternal Grandmother    Heart attack Paternal Grandfather    Past Surgical History:  Procedure Laterality Date   BTL  2000   CARPAL TUNNEL RELEASE Left    CESAREAN SECTION  1978. 1983   CORONARY STENT INTERVENTION N/A 01/27/2018   Procedure: CORONARY STENT INTERVENTION;  Surgeon: Burnell Blanks, MD;  Location: [facility];  Service: Cardiovascular;  Laterality: N/A;   eye implants     FOOT SURGERY Left 2008   front teeth removed after injury     KNEE ARTHROSCOPY WITH LATERAL MENISECTOMY Left 02/02/2019   Procedure: LEFT KNEE ARTHROSCOPY, MEDIAL AND LATERAL MENISECTOMY, EXCISION OF LEFT LATERAL MENISCAL CYST;  Surgeon: Garald Balding, MD;  Location: [facility];   Service: Orthopedics;  Laterality: Left;   LEFT HEART CATH AND CORONARY ANGIOGRAPHY N/A 01/27/2018   Procedure: LEFT HEART CATH AND CORONARY ANGIOGRAPHY;  Surgeon: Burnell Blanks, MD;  Location: [facility];  Service: Cardiovascular;  Laterality: N/A;   pre cancerous lesion removed from forehead     TOTAL ABDOMINAL HYSTERECTOMY W/ BILATERAL SALPINGOOPHORECTOMY  2002   Dr. Willis Modena   VAGINAL HYSTERECTOMY  2001   Social History   Social History Narrative   Lives at home with spouse.    Right handed.   Caffeine use: 2 cups coffee/day    8 oz soda/day    Immunization History  Administered Date(s) Administered   PFIZER(Purple Top)SARS-COV-2 Vaccination 04/10/2019, 05/05/2019, 10/19/2019, 06/20/2020   Pfizer Covid-19 Vaccine Bivalent Booster 26yrs & up 11/29/2020   Tdap 04/03/2018     Objective: Vital Signs: BP 117/70 (BP Location: Left Arm, Patient Position: Sitting, Cuff Size: Normal)   Pulse 66   Resp 16   Ht 5\' 2"  (1.575 m)   Wt 160 lb (72.6 kg)   BMI 29.26 kg/m    Physical Exam Cardiovascular:     Rate and Rhythm: Normal rate and regular rhythm.  Pulmonary:     Effort: Pulmonary effort is normal.     Breath sounds: Normal breath sounds.  Musculoskeletal:     Right lower leg: No edema.     Left lower leg: No edema.  Skin:    General: Skin is warm and dry.     Findings: No rash.  Neurological:     Mental Status: She is alert.  Psychiatric:        Mood and Affect: Mood normal.      Musculoskeletal Exam:  Shoulders full ROM no tenderness or swelling Elbows full ROM no tenderness or swelling Wrists full ROM no tenderness or swelling 2nd and 3rd MCP enlargement bilaterally, no tenderness or palpable synovitis, DIP heberdon's nodes worst right 2nd digit Mild right knee tenderness, left knee swelling most laterally and posteriorly, decreased ROM, no warmth or erythema Right foot bunion, 4th toe distal callus/nodule with tenderness, left foot claw toes  4th digit nonreducible and tender   CDAI Exam: CDAI Score: -- Patient Global: --; Provider Global: -- Swollen: 1 ; Tender: 6  Joint Exam 08/07/2021      Right  Left  Knee     Swollen Tender  MTP 1   Tender   Tender  MTP 2      Tender  MTP 3      Tender  MTP 4  Tender     Investigation: No additional findings.  Imaging: No results found.  Recent Labs: Lab Results  Component Value Date   WBC 4.2 06/04/2021   HGB 12.5 06/04/2021   PLT 195 06/04/2021   NA 140 06/13/2021   K 4.1 06/13/2021   CL 99 06/13/2021   CO2 27 06/13/2021   GLUCOSE 91 06/13/2021   BUN 21 06/13/2021   CREATININE 0.94 06/13/2021   BILITOT 1.0 06/04/2021   ALKPHOS 135 (H) 09/15/2020   AST 24 06/04/2021   ALT 14 06/04/2021   PROT 7.1 06/04/2021   ALBUMIN 4.1 09/15/2020   CALCIUM 9.5 06/13/2021   GFRAA 65 02/04/2020   QFTBGOLDPLUS NEGATIVE 07/10/2021    Speciality Comments: No specialty comments available.  Procedures:  No procedures performed Allergies: Erythromycin   Assessment / Plan:     Visit Diagnoses: Rheumatoid arthritis, involving unspecified site, unspecified whether rheumatoid factor present ([place]) - Plan: C-reactive protein  Inflammatory arthritis appears fairly controlled she has joint pain especially in lower extremities with chronic deformity and osteoarthritis. Checking CRP today. Left knee swelling is present but suspect more related to the OA. Plan to continue Enbrel 50 mg [place] weekly.  Primary osteoarthritis of left knee  Too soon for repeat joint injection last 2 months ago, would be an option for repeat at follow up.  High risk medication use - Enbrel 50 mg [place] weekly. - Plan: CBC with Differential/Platelet, COMPLETE METABOLIC PANEL WITH GFR  Checking CBC and CMP for Enbrel medication monitoring. No new problems reported with increased dose frequency/consistency.  Bunion of great toe of right foot Claw toe, left - Plan: Ambulatory referral to Podiatry  Ongoing  chronic toe deformities including bunion and claw toe causing a lot of pain complaints and affecting gait and footwear. I recommend she might have additional options or advice on this with a podiatry evaluation will refer today.  Orders: Orders Placed This Encounter  Procedures   C-reactive protein   CBC with Differential/Platelet   COMPLETE METABOLIC PANEL WITH GFR   Ambulatory referral to Podiatry   No orders of the defined types were placed in this encounter.    Follow-Up Instructions: Return in about 2 months (around 10/07/2021) for RA on ENB/OA f/u 2-64mos.   Collier Salina, MD  Note - This record has been created using [employer].  Chart creation errors have been sought, but may not always  have been located. Such creation errors do not reflect on  the standard of medical care.

## 2021-08-02 DIAGNOSIS — F332 Major depressive disorder, recurrent severe without psychotic features: Secondary | ICD-10-CM | POA: Diagnosis not present

## 2021-08-02 DIAGNOSIS — F1320 Sedative, hypnotic or anxiolytic dependence, uncomplicated: Secondary | ICD-10-CM | POA: Diagnosis not present

## 2021-08-02 DIAGNOSIS — F1121 Opioid dependence, in remission: Secondary | ICD-10-CM | POA: Diagnosis not present

## 2021-08-02 DIAGNOSIS — F411 Generalized anxiety disorder: Secondary | ICD-10-CM | POA: Diagnosis not present

## 2021-08-06 DIAGNOSIS — R351 Nocturia: Secondary | ICD-10-CM | POA: Diagnosis not present

## 2021-08-06 DIAGNOSIS — R350 Frequency of micturition: Secondary | ICD-10-CM | POA: Diagnosis not present

## 2021-08-06 DIAGNOSIS — N3941 Urge incontinence: Secondary | ICD-10-CM | POA: Diagnosis not present

## 2021-08-07 ENCOUNTER — Ambulatory Visit: Payer: Medicare PPO | Admitting: Internal Medicine

## 2021-08-07 ENCOUNTER — Encounter: Payer: Self-pay | Admitting: Internal Medicine

## 2021-08-07 VITALS — BP 117/70 | HR 66 | Resp 16 | Ht 62.0 in | Wt 160.0 lb

## 2021-08-07 DIAGNOSIS — Z79899 Other long term (current) drug therapy: Secondary | ICD-10-CM | POA: Diagnosis not present

## 2021-08-07 DIAGNOSIS — M1712 Unilateral primary osteoarthritis, left knee: Secondary | ICD-10-CM | POA: Diagnosis not present

## 2021-08-07 DIAGNOSIS — M205X2 Other deformities of toe(s) (acquired), left foot: Secondary | ICD-10-CM | POA: Diagnosis not present

## 2021-08-07 DIAGNOSIS — M069 Rheumatoid arthritis, unspecified: Secondary | ICD-10-CM

## 2021-08-07 DIAGNOSIS — M21611 Bunion of right foot: Secondary | ICD-10-CM

## 2021-08-08 LAB — COMPLETE METABOLIC PANEL WITH GFR
AG Ratio: 1.3 (calc) (ref 1.0–2.5)
ALT: 13 U/L (ref 6–29)
AST: 21 U/L (ref 10–35)
Albumin: 4.1 g/dL (ref 3.6–5.1)
Alkaline phosphatase (APISO): 136 U/L (ref 37–153)
BUN: 15 mg/dL (ref 7–25)
CO2: 33 mmol/L — ABNORMAL HIGH (ref 20–32)
Calcium: 9.5 mg/dL (ref 8.6–10.4)
Chloride: 98 mmol/L (ref 98–110)
Creat: 0.98 mg/dL (ref 0.60–1.00)
Globulin: 3.2 g/dL (calc) (ref 1.9–3.7)
Glucose, Bld: 89 mg/dL (ref 65–99)
Potassium: 3.8 mmol/L (ref 3.5–5.3)
Sodium: 142 mmol/L (ref 135–146)
Total Bilirubin: 0.8 mg/dL (ref 0.2–1.2)
Total Protein: 7.3 g/dL (ref 6.1–8.1)
eGFR: 61 mL/min/{1.73_m2} (ref >=60)

## 2021-08-08 LAB — CBC WITH DIFFERENTIAL/PLATELET
Absolute Monocytes: 326 cells/uL (ref 200–950)
Basophils Absolute: 19 cells/uL (ref 0–200)
Basophils Relative: 0.5 %
Eosinophils Absolute: 285 cells/uL (ref 15–500)
Eosinophils Relative: 7.7 %
HCT: 38.9 % (ref 35.0–45.0)
Hemoglobin: 12.9 g/dL (ref 11.7–15.5)
Lymphs Abs: 1125 cells/uL (ref 850–3900)
MCH: 26.8 pg — ABNORMAL LOW (ref 27.0–33.0)
MCHC: 33.2 g/dL (ref 32.0–36.0)
MCV: 80.9 fL (ref 80.0–100.0)
MPV: 9.1 fL (ref 7.5–12.5)
Monocytes Relative: 8.8 %
Neutro Abs: 1946 cells/uL (ref 1500–7800)
Neutrophils Relative %: 52.6 %
Platelets: 187 10*3/uL (ref 140–400)
RBC: 4.81 10*6/uL (ref 3.80–5.10)
RDW: 12.8 % (ref 11.0–15.0)
Total Lymphocyte: 30.4 %
WBC: 3.7 10*3/uL — ABNORMAL LOW (ref 3.8–10.8)

## 2021-08-08 LAB — C-REACTIVE PROTEIN: CRP: 5.1 mg/L (ref <8.0)

## 2021-08-09 DIAGNOSIS — M8580 Other specified disorders of bone density and structure, unspecified site: Secondary | ICD-10-CM | POA: Diagnosis not present

## 2021-08-09 DIAGNOSIS — M069 Rheumatoid arthritis, unspecified: Secondary | ICD-10-CM | POA: Diagnosis not present

## 2021-08-09 DIAGNOSIS — Z0000 Encounter for general adult medical examination without abnormal findings: Secondary | ICD-10-CM | POA: Diagnosis not present

## 2021-08-09 DIAGNOSIS — Z23 Encounter for immunization: Secondary | ICD-10-CM | POA: Diagnosis not present

## 2021-08-09 DIAGNOSIS — I5032 Chronic diastolic (congestive) heart failure: Secondary | ICD-10-CM | POA: Diagnosis not present

## 2021-08-09 DIAGNOSIS — L853 Xerosis cutis: Secondary | ICD-10-CM | POA: Diagnosis not present

## 2021-08-09 DIAGNOSIS — I10 Essential (primary) hypertension: Secondary | ICD-10-CM | POA: Diagnosis not present

## 2021-08-09 DIAGNOSIS — M85859 Other specified disorders of bone density and structure, unspecified thigh: Secondary | ICD-10-CM | POA: Diagnosis not present

## 2021-08-09 DIAGNOSIS — E782 Mixed hyperlipidemia: Secondary | ICD-10-CM | POA: Diagnosis not present

## 2021-08-16 DIAGNOSIS — H353221 Exudative age-related macular degeneration, left eye, with active choroidal neovascularization: Secondary | ICD-10-CM | POA: Diagnosis not present

## 2021-08-16 DIAGNOSIS — H353212 Exudative age-related macular degeneration, right eye, with inactive choroidal neovascularization: Secondary | ICD-10-CM | POA: Diagnosis not present

## 2021-08-16 DIAGNOSIS — H43813 Vitreous degeneration, bilateral: Secondary | ICD-10-CM | POA: Diagnosis not present

## 2021-08-16 DIAGNOSIS — H43393 Other vitreous opacities, bilateral: Secondary | ICD-10-CM | POA: Diagnosis not present

## 2021-08-21 ENCOUNTER — Other Ambulatory Visit: Payer: Self-pay | Admitting: Family Medicine

## 2021-08-21 DIAGNOSIS — M8580 Other specified disorders of bone density and structure, unspecified site: Secondary | ICD-10-CM

## 2021-08-21 DIAGNOSIS — M85859 Other specified disorders of bone density and structure, unspecified thigh: Secondary | ICD-10-CM

## 2021-08-24 ENCOUNTER — Other Ambulatory Visit: Payer: Medicare PPO

## 2021-08-24 DIAGNOSIS — Z79899 Other long term (current) drug therapy: Secondary | ICD-10-CM

## 2021-08-24 DIAGNOSIS — E782 Mixed hyperlipidemia: Secondary | ICD-10-CM | POA: Diagnosis not present

## 2021-08-25 LAB — NMR, LIPOPROFILE
Cholesterol, Total: 146 mg/dL (ref 100–199)
HDL Particle Number: 40.1 umol/L (ref >=30.5)
HDL-C: 52 mg/dL (ref >39)
LDL Particle Number: 920 nmol/L (ref <1000)
LDL Size: 21.1 nm (ref >20.5)
LDL-C (NIH Calc): 71 mg/dL (ref 0–99)
LP-IR Score: 64 — ABNORMAL HIGH (ref <=45)
Small LDL Particle Number: 458 nmol/L (ref <=527)
Triglycerides: 132 mg/dL (ref 0–149)

## 2021-09-03 DIAGNOSIS — R350 Frequency of micturition: Secondary | ICD-10-CM | POA: Diagnosis not present

## 2021-09-03 DIAGNOSIS — R3915 Urgency of urination: Secondary | ICD-10-CM | POA: Diagnosis not present

## 2021-09-03 DIAGNOSIS — R351 Nocturia: Secondary | ICD-10-CM | POA: Diagnosis not present

## 2021-09-06 ENCOUNTER — Telehealth: Payer: Self-pay | Admitting: Internal Medicine

## 2021-09-06 ENCOUNTER — Telehealth: Payer: Self-pay | Admitting: Pharmacist

## 2021-09-06 DIAGNOSIS — M069 Rheumatoid arthritis, unspecified: Secondary | ICD-10-CM

## 2021-09-06 DIAGNOSIS — Z79899 Other long term (current) drug therapy: Secondary | ICD-10-CM

## 2021-09-06 NOTE — Telephone Encounter (Addendum)
Received VM from patient stating she is having difficulty filling Enbrel. Returned call to patient but unable to reach. Left VM requesting return call  Per dispense report, last fill was 07/24/21.  [facility] for details. Per rep, Enbrel is set up on order form. Per rep, patient was referred to [employer] patient assistance.  Patient received her last shipment on 07/12/21. Her copay was $32. Her copay in 06/13/21 was $100. Both were for 28 day supply. Her copay is back to $100 for 28 day supply.  Provider for [employer] completed today.Will mail patient her portion of application today to prevent delay. Called [employer] to make sure they are not waiting on provider portion but they could not find her case and they are backed up enrollment applications. Placed provider portion in PAP pending info folder in pharmacy office.  Chesley Mires, PharmD, MPH, BCPS, CPP Clinical Pharmacist (Rheumatology and Pulmonology)

## 2021-09-06 NOTE — Telephone Encounter (Signed)
Spoke with patient regarding Enbrel - she states she cannot afford the $100 copay anymore. She does confirm that she falls below the income threshold for household of two. She has been advised that application was mailed today and that when she drops off at the clinic we can provide her with a sample.  She verbalized understanding.  Chesley Mires, PharmD, MPH, BCPS, CPP Clinical Pharmacist (Rheumatology and Pulmonology)

## 2021-09-06 NOTE — Telephone Encounter (Signed)
Patient called the office stating she had missed a call from [facility] and requets a return call.

## 2021-09-06 NOTE — Telephone Encounter (Signed)
Returned call to patient. Documented in separate encounter  Chesley Mires, PharmD, MPH, BCPS, CPP Clinical Pharmacist (Rheumatology and Pulmonology)

## 2021-09-11 NOTE — Telephone Encounter (Signed)
Medication Samples have been provided to the patient.  Drug name: enbrel sureclick       Strength: 50mg /mL        Qty: 1  LOT  Exp.Date: 11/25/2023  Dosing instructions: Inject 50mg  into the skin once weekly.

## 2021-09-12 NOTE — Telephone Encounter (Signed)
PREPARED Patient Assistance Application to [employer] for ENBREL along with provider portion, patient portion, med list, and insurance card copy. Will update patient when we receive a response.  Fax# [phone] Phone# [phone]  Per [employer], the authorization letter cannot be faxed because it is from 2022. Will have to re-generate authorization. Initiated this over the phone. Turnaround time is 24 hours. Clinicals faxed in  Phone: [phone] Fax: [phone] Case # [id number]  Application placed in PAP pending info folder in pharmacy office until we receive this authorization determination  Chesley Mires, PharmD, MPH, BCPS, CPP Clinical Pharmacist (Rheumatology and Pulmonology)

## 2021-09-12 NOTE — Telephone Encounter (Signed)
Enbrel prior auth approval letter received. Submitted Patient Assistance Application to [employer] for ENBREL along with provider portion, patient portion, PA, med list, and insurance card copy. Will update patient when we receive a response.  Fax# [phone] Phone# [phone]  Chesley Mires, PharmD, MPH, BCPS, CPP Clinical Pharmacist (Rheumatology and Pulmonology)

## 2021-09-18 ENCOUNTER — Ambulatory Visit: Payer: Medicare PPO | Admitting: Internal Medicine

## 2021-09-21 NOTE — Telephone Encounter (Signed)
Called [employer] for update on patient's Enbrel patient assistance application. Initially rep states PA approval letter was once again not received. Then before hanging up phone, she states she found it and previous rep loaded it into the wrong part of their system.  Phone: [phone], option 3, option 1  Sergei Delo, PharmD, MPH, BCPS, CPP Clinical Pharmacist (Rheumatology and Pulmonology)

## 2021-09-24 ENCOUNTER — Other Ambulatory Visit: Payer: Self-pay | Admitting: Internal Medicine

## 2021-09-25 MED ORDER — ENBREL SURECLICK 50 MG/ML ~~LOC~~ SOAJ: 50.0000 mg | SUBCUTANEOUS | 0 refills | Status: DC

## 2021-09-25 NOTE — Telephone Encounter (Signed)
Received a fax from  [employer] regarding an approval for ENBREL patient assistance from 09/24/21 to 02/24/22.   Phone number: [phone]  ATC patient to discuss approval. Unable to reach. Left VM advising her to reach out to [employer] to set up shipment.  Chesley Mires, PharmD, MPH, BCPS, CPP Clinical Pharmacist (Rheumatology and Pulmonology)

## 2021-09-27 DIAGNOSIS — F332 Major depressive disorder, recurrent severe without psychotic features: Secondary | ICD-10-CM | POA: Diagnosis not present

## 2021-09-27 DIAGNOSIS — F1121 Opioid dependence, in remission: Secondary | ICD-10-CM | POA: Diagnosis not present

## 2021-09-27 DIAGNOSIS — F1320 Sedative, hypnotic or anxiolytic dependence, uncomplicated: Secondary | ICD-10-CM | POA: Diagnosis not present

## 2021-09-27 DIAGNOSIS — F411 Generalized anxiety disorder: Secondary | ICD-10-CM | POA: Diagnosis not present

## 2021-09-27 NOTE — Telephone Encounter (Signed)
ATC patient regarding Enbrel patient assistance approval. Unable to reach. Left VM advising. Will close encounter.  Chesley Mires, PharmD, MPH, BCPS, CPP Clinical Pharmacist (Rheumatology and Pulmonology)

## 2021-09-27 NOTE — Progress Notes (Signed)
Office Visit Note  Patient: Meghan Schwartz             Date of Birth: [birth year]           MRN: [id number]             PCP: Pcp, No Referring: No ref. provider found Visit Date: 10/09/2021   Subjective:  Follow-up (Left knee swelling, pain, clicking, possible cyst.)   History of Present Illness: Meghan Schwartz is a 71 y.o. female here for follow up for RA on Enbrel 50 mg subcu weekly.  Overall most of her joint problems have been doing reasonably well but has some increased symptoms affecting her knees especially.  There is increased swelling on both sides currently having more trouble with the left knee reports crepitus and grinding sensations especially with rotational movement and when lying supine.  She has some bruising on the left forearm without specific provoking injury.  However not seeing any joint pain or swelling in those areas.  Previous HPI 08/07/2021 Meghan Schwartz is a 71 y.o. female here for follow up for RA on Enbrel 50 mg [place] weekly. She has made an effort to taking her Enbrel consistently every week. Her knee pain improved after the last steroid injection about 2 months ago which was her worst arthritis complaint. She continues having trouble with her feet and toes limiting wearing a variety of shoes and affecting her walking. Based on last follow up with Dr. Lajoyce Corners she does not anticipate any additional surgery or procedure intervention for this coming up.    Previous HPI 06/04/2021 Meghan Schwartz is a 71 y.o. female here for follow up for RA on Enbrel 50 mg [place] weekly. She reports varying adherence to treatment plan often missing Enbrel doses and estimates that she takes it about every 10-14 days. She saw Dr. Lajoyce Corners for her painful left toe deformity on February but reports not remembering the plan very well. Foot pain and difficulty walking on this side bothers her daily. Left knee pain and swelling improved after steroid injection but is worse again for the past few weeks. Swelling in  the back of the knee bothers her and limits her knee range of movement.   Previous HPI 03/05/21 Meghan Schwartz is a 71 y.o. female here for follow up for RA on Enbrel 50 mg [place] weekly. She also appeared to have considerable osteoarthritis contribution to symptoms in hands and knees as well. Inflammatory markers with mildly elevated CRP of 11.9. She has been overall well except suffered a bite to her right hand from her dog currently on augmentin after ED visit with total of 14 days prescribed. Left knee pain is worse than usual posteriorly with swelling she reports previous baker's cyst has bothered her since she had knee surgery in the past.   Previous HPI 02/05/21 Meghan Schwartz is a 71 y.o. female her for rheumatoid arthritis on Enbrel 50 mg [place] weekly for which she has been seeing Dr. Nickola Major previously. She was originally diagnosed about 10 or 11 years ago due to development of joint pain, stiffness, synovitis, and progressive joint deformities primarily starting in the feet and knees.  Subsequently proceeded to have increased joint pains involving bilateral hands with some swelling at the PIP and MCP joints.  She started treatment with Enbrel injections with some improvement in the lower extremity joint pains she recalls still having a lot of pain symptoms and pronounced fatigue that did not clear with this  treatment.  Combination treatment was tried with methotrexate with significant GI intolerance.  She tried leflunomide but developed substantial alopecia so discontinued the medicine.  She switched to hydroxychloroquine combination treatment which was being well-tolerated but she has had multiple new or worsening medical problems over the past few years with concern of medication related effects. She had hand numbness in the past improved after left carpal tunnel release surgery but she has some residual muscle atrophy in thenar prominence. She had left knee meniscectomy in 2020. She suffered NSTEMI in  01/2018 with DES placement in RCA for 99% blockage and has chronic diastolic CHF. Her edema is generally controlled while taking torsemide but does rapidly re accumulate fluid off this medication. Ophthalmology evaluation indicated significant visual field deficits findings consistent with age-related macular degeneration of both eyes and recommended avoidance of this medication.  She also has considerable osteoarthritis especially in her knees and feet previous surgical reconstruction on the left foot. Overall she feels her gait is not very stable due to these problems.   DMARD Hx Enbrel - current   HCQ - macular disease? LEF - alopecia MTX - GI intolerance.    Review of Systems  Constitutional:  Positive for fatigue.  HENT:  Positive for mouth dryness. Negative for mouth sores.   Eyes:  Positive for dryness.  Respiratory:  Positive for shortness of breath.   Cardiovascular:  Negative for chest pain and palpitations.  Gastrointestinal:  Negative for blood in stool, constipation and diarrhea.  Endocrine: Positive for increased urination.  Musculoskeletal:  Positive for joint pain, gait problem, joint pain, joint swelling, myalgias, muscle weakness, morning stiffness, muscle tenderness and myalgias.  Skin:  Negative for color change, rash, hair loss and sensitivity to sunlight.  Allergic/Immunologic: Positive for susceptible to infections.  Neurological:  Positive for headaches. Negative for dizziness.  Hematological:  Negative for swollen glands.  Psychiatric/Behavioral:  Positive for depressed mood. Negative for sleep disturbance. The patient is nervous/anxious.     PMFS History:  Patient Active Problem List   Diagnosis Date Noted   Claw toe, left 08/07/2021   Dog bite 03/05/2021   High risk medication use 02/05/2021   Dry mouth 02/05/2021   Pain in left foot 01/31/2021   Bunion of great toe of right foot 06/15/2020   Hypoxia 03/05/2019   Derangement of posterior horn of medial  meniscus 02/02/2019   Derangement of posterior horn of lateral meniscus 02/02/2019   Meniscal cyst, unspecified laterality 12/01/2018   Torn medial meniscus 12/01/2018   Primary osteoarthritis of left knee 12/01/2018   Coronary artery disease involving native coronary artery of native heart without angina pectoris 09/14/2018   Chronic diastolic (congestive) heart failure (HCC)    Chronic pain of left knee 05/15/2018   Mass of left knee 05/15/2018   Non-ST elevation (NSTEMI) myocardial infarction [facility])    Chest pain 01/25/2018   RA (rheumatoid arthritis) (HCC) 01/25/2018   Hyperlipidemia 01/25/2018   Hypokalemia 01/25/2018   Depression with anxiety 01/25/2018   Tobacco abuse 01/25/2018   Lower extremity cellulitis 01/25/2018   Benzodiazepine dependence (HCC)    MDD (major depressive disorder), recurrent severe, without psychosis (HCC) 10/02/2017   CTS (carpal tunnel syndrome) 06/08/2014    Past Medical History:  Diagnosis Date   Allergic rhinitis    Anxiety    Benzodiazepine dependence (HCC)    Bruxism    CAD (coronary artery disease)    S/p NSTEMI 01/2018 >> LHC: oLAD 20, pLAD 20; oD1 20, oLCx 40, pRCA  99 >> PCI: DES to prox RCA // Echo 01/2018: EF 50-55; prob inf-lat and inf HK   Chronic diastolic heart failure    Echocardiogram 07/2018: EF 55-60, grade 1 diastolic dysfunction, normal wall motion, normal GLS (-22.3), PASP 28   Chronic foot pain    bunions, torn ligaments-on chronic pain medication   CTS (carpal tunnel syndrome) 06/08/2014   Depression    High cholesterol    Hypertension    MDD (major depressive disorder) 10/02/2017   Migraine    Myocardial infarction (HCC) 02/01/2018   Pneumonia 1986   left lung   RA (rheumatoid arthritis) (HCC)     Family History  Problem Relation Age of Onset   Thyroid disease Mother    Breast cancer Mother    Parkinson's disease Father    Raynaud syndrome Maternal Aunt    Arthritis Maternal Grandmother    Heart attack Maternal  Grandmother    Stroke Paternal Grandmother    Heart attack Paternal Grandfather    Past Surgical History:  Procedure Laterality Date   BTL  2000   CARPAL TUNNEL RELEASE Left    CESAREAN SECTION  1978. 1983   CORONARY STENT INTERVENTION N/A 01/27/2018   Procedure: CORONARY STENT INTERVENTION;  Surgeon: Kathleene Hazel, MD;  Location: [facility];  Service: Cardiovascular;  Laterality: N/A;   eye implants     FOOT SURGERY Left 2008   front teeth removed after injury     KNEE ARTHROSCOPY WITH LATERAL MENISECTOMY Left 02/02/2019   Procedure: LEFT KNEE ARTHROSCOPY, MEDIAL AND LATERAL MENISECTOMY, EXCISION OF LEFT LATERAL MENISCAL CYST;  Surgeon: Valeria Batman, MD;  Location: [facility];  Service: Orthopedics;  Laterality: Left;   LEFT HEART CATH AND CORONARY ANGIOGRAPHY N/A 01/27/2018   Procedure: LEFT HEART CATH AND CORONARY ANGIOGRAPHY;  Surgeon: Kathleene Hazel, MD;  Location: [facility];  Service: Cardiovascular;  Laterality: N/A;   pre cancerous lesion removed from forehead     TOTAL ABDOMINAL HYSTERECTOMY W/ BILATERAL SALPINGOOPHORECTOMY  2002   Dr. Jackelyn Knife   VAGINAL HYSTERECTOMY  2001   Social History   Social History Narrative   Lives at home with spouse.    Right handed.   Caffeine use: 2 cups coffee/day    8 oz soda/day    Immunization History  Administered Date(s) Administered   PFIZER(Purple Top)SARS-COV-2 Vaccination 04/10/2019, 05/05/2019, 10/19/2019, 06/20/2020   Pfizer Covid-19 Vaccine Bivalent Booster 71yrs & up 11/29/2020   Tdap 04/03/2018     Objective: Vital Signs: BP 106/69 (BP Location: Left Arm, Patient Position: Sitting, Cuff Size: Normal)   Pulse 66   Resp 14   Ht 5\' 3"  (1.6 m)   Wt 159 lb 9.6 oz (72.4 kg)   BMI 28.27 kg/m    Physical Exam Cardiovascular:     Rate and Rhythm: Normal rate and regular rhythm.  Pulmonary:     Effort: Pulmonary effort is normal.     Breath sounds: Normal breath sounds.   Musculoskeletal:     Right lower leg: No edema.     Left lower leg: No edema.  Skin:    General: Skin is warm and dry.     Findings: Bruising present.  Neurological:     Mental Status: She is alert.  Psychiatric:        Mood and Affect: Mood normal.      Musculoskeletal Exam:  Shoulders full ROM no tenderness or swelling Elbows full ROM no tenderness or swelling Wrists full  ROM no tenderness or swelling Fingers full ROM bony nodules on multiple fingers, no severe subluxation or deviation Both knees with crepitus palpable joint effusions slightly impaired range of motion she has more tenderness on the left than on the right Extensive toe changes with some cocked up deformity and lateral first MTP deviation   CDAI Exam: CDAI Score: 10  Patient Global: 40 mm; Provider Global: 20 mm Swollen: 2 ; Tender: 2  Joint Exam 10/09/2021      Right  Left  Knee  Swollen Tender  Swollen Tender     Investigation: No additional findings.  Imaging: No results found.  Recent Labs: Lab Results  Component Value Date   WBC 4.0 10/09/2021   HGB 12.2 10/09/2021   PLT 192 10/09/2021   NA 144 10/09/2021   K 4.4 10/09/2021   CL 100 10/09/2021   CO2 33 (H) 10/09/2021   GLUCOSE 94 10/09/2021   BUN 23 10/09/2021   CREATININE 1.10 (H) 10/09/2021   BILITOT 0.5 10/09/2021   ALKPHOS 135 (H) 09/15/2020   AST 18 10/09/2021   ALT 12 10/09/2021   PROT 7.2 10/09/2021   ALBUMIN 4.1 09/15/2020   CALCIUM 9.6 10/09/2021   GFRAA 65 02/04/2020   QFTBGOLDPLUS NEGATIVE 07/10/2021    Speciality Comments: No specialty comments available.  Procedures:  Large Joint Inj: L knee on 10/09/2021 3:20 PM Indications: pain and joint swelling Details: 22 G needle, superolateral approach Medications: 4 mL lidocaine 1 %; 40 mg triamcinolone acetonide 40 MG/ML Aspirate: 41 mL yellow and clear Outcome: tolerated well, no immediate complications Consent was given by the patient. Immediately prior to  procedure a time out was called to verify the correct patient, procedure, equipment, support staff and site/side marked as required. Patient was prepped and draped in the usual sterile fashion.     Allergies: Erythromycin   Assessment / Plan:     Visit Diagnoses: Rheumatoid arthritis, involving unspecified site, unspecified whether rheumatoid factor present (HCC) - Plan: C-reactive protein, Large Joint Inj: L knee  Rheumatoid arthritis appears to be fairly well controlled the only objective inflammatory changes on exam right now are bilateral knee effusions.  Also checking CRP for disease activity monitoring.  Aspiration and injection of the left knee for diagnostic and therapeutic purposes trying to assess whether this is more attributable to underlying chronic degenerative arthritis or RA flare.  Plan to continue Enbrel 50 mg subcu weekly.  High risk medication use - Enbrel 50 mg [place] weekly. - Plan: CBC with Differential/Platelet, COMPLETE METABOLIC PANEL WITH GFR  Checking CBC and CMP for Enbrel toxicity monitoring.  Primary osteoarthritis of left knee  Aspiration injection as above.  She also has chronic arthritis in the right knee but has had orthopedic surgery at this site could offer similar treatment if she has a good response today.  Bunion of great toe of right foot Claw toe, left  I recommend she can consider a previously discussed surgical correction has seen Dr. Lajoyce Corners for this problem.  I do not see active inflammation in the area that would require any particular waiting or medical optimization.  Orders: Orders Placed This Encounter  Procedures   Large Joint Inj: L knee   C-reactive protein   CBC with Differential/Platelet   COMPLETE METABOLIC PANEL WITH GFR   No orders of the defined types were placed in this encounter.    Follow-Up Instructions: Return in about 3 months (around 01/09/2022) for RA/OA on ENB and injection f/u 3mos.  Fuller Plan, MD  Note -  This record has been created using [employer].  Chart creation errors have been sought, but may not always  have been located. Such creation errors do not reflect on  the standard of medical care.

## 2021-10-01 DIAGNOSIS — F332 Major depressive disorder, recurrent severe without psychotic features: Secondary | ICD-10-CM | POA: Diagnosis not present

## 2021-10-01 DIAGNOSIS — F1320 Sedative, hypnotic or anxiolytic dependence, uncomplicated: Secondary | ICD-10-CM | POA: Diagnosis not present

## 2021-10-01 DIAGNOSIS — F411 Generalized anxiety disorder: Secondary | ICD-10-CM | POA: Diagnosis not present

## 2021-10-01 DIAGNOSIS — F1121 Opioid dependence, in remission: Secondary | ICD-10-CM | POA: Diagnosis not present

## 2021-10-03 ENCOUNTER — Ambulatory Visit: Payer: Medicare PPO | Admitting: Podiatry

## 2021-10-09 ENCOUNTER — Ambulatory Visit: Payer: Medicare PPO | Attending: Internal Medicine | Admitting: Internal Medicine

## 2021-10-09 ENCOUNTER — Encounter: Payer: Self-pay | Admitting: Internal Medicine

## 2021-10-09 VITALS — BP 106/69 | HR 66 | Resp 14 | Ht 63.0 in | Wt 159.6 lb

## 2021-10-09 DIAGNOSIS — M21611 Bunion of right foot: Secondary | ICD-10-CM

## 2021-10-09 DIAGNOSIS — M069 Rheumatoid arthritis, unspecified: Secondary | ICD-10-CM | POA: Diagnosis not present

## 2021-10-09 DIAGNOSIS — Z79899 Other long term (current) drug therapy: Secondary | ICD-10-CM

## 2021-10-09 DIAGNOSIS — M205X2 Other deformities of toe(s) (acquired), left foot: Secondary | ICD-10-CM

## 2021-10-09 DIAGNOSIS — M1712 Unilateral primary osteoarthritis, left knee: Secondary | ICD-10-CM

## 2021-10-10 LAB — COMPLETE METABOLIC PANEL WITH GFR
AG Ratio: 1.3 (calc) (ref 1.0–2.5)
ALT: 12 U/L (ref 6–29)
AST: 18 U/L (ref 10–35)
Albumin: 4.1 g/dL (ref 3.6–5.1)
Alkaline phosphatase (APISO): 134 U/L (ref 37–153)
BUN/Creatinine Ratio: 21 (calc) (ref 6–22)
BUN: 23 mg/dL (ref 7–25)
CO2: 33 mmol/L — ABNORMAL HIGH (ref 20–32)
Calcium: 9.6 mg/dL (ref 8.6–10.4)
Chloride: 100 mmol/L (ref 98–110)
Creat: 1.1 mg/dL — ABNORMAL HIGH (ref 0.60–1.00)
Globulin: 3.1 g/dL (calc) (ref 1.9–3.7)
Glucose, Bld: 94 mg/dL (ref 65–99)
Potassium: 4.4 mmol/L (ref 3.5–5.3)
Sodium: 144 mmol/L (ref 135–146)
Total Bilirubin: 0.5 mg/dL (ref 0.2–1.2)
Total Protein: 7.2 g/dL (ref 6.1–8.1)
eGFR: 53 mL/min/{1.73_m2} — ABNORMAL LOW (ref >=60)

## 2021-10-10 LAB — CBC WITH DIFFERENTIAL/PLATELET
Absolute Monocytes: 320 cells/uL (ref 200–950)
Basophils Absolute: 20 cells/uL (ref 0–200)
Basophils Relative: 0.5 %
Eosinophils Absolute: 320 cells/uL (ref 15–500)
Eosinophils Relative: 8 %
HCT: 36.9 % (ref 35.0–45.0)
Hemoglobin: 12.2 g/dL (ref 11.7–15.5)
Lymphs Abs: 1392 cells/uL (ref 850–3900)
MCH: 26.9 pg — ABNORMAL LOW (ref 27.0–33.0)
MCHC: 33.1 g/dL (ref 32.0–36.0)
MCV: 81.5 fL (ref 80.0–100.0)
MPV: 9 fL (ref 7.5–12.5)
Monocytes Relative: 8 %
Neutro Abs: 1948 cells/uL (ref 1500–7800)
Neutrophils Relative %: 48.7 %
Platelets: 192 10*3/uL (ref 140–400)
RBC: 4.53 10*6/uL (ref 3.80–5.10)
RDW: 13 % (ref 11.0–15.0)
Total Lymphocyte: 34.8 %
WBC: 4 10*3/uL (ref 3.8–10.8)

## 2021-10-10 LAB — C-REACTIVE PROTEIN: CRP: 6.7 mg/L (ref <8.0)

## 2021-10-10 NOTE — Progress Notes (Signed)
Her inflammatory markers remain normal I believe the reaccumulation of knee swelling is from her osteoarthritis.  Blood count and metabolic panel look normal with no problems to continue the Enbrel.

## 2021-10-14 MED ORDER — LIDOCAINE HCL 1 % IJ SOLN
4.0000 mL | INTRAMUSCULAR | Status: AC | PRN
  Administered 2021-10-09: 4 mL

## 2021-10-14 MED ORDER — TRIAMCINOLONE ACETONIDE 40 MG/ML IJ SUSP
40.0000 mg | INTRAMUSCULAR | Status: AC | PRN
  Administered 2021-10-09: 40 mg via INTRA_ARTICULAR

## 2021-10-21 ENCOUNTER — Other Ambulatory Visit: Payer: Self-pay | Admitting: Internal Medicine

## 2021-10-22 DIAGNOSIS — H353221 Exudative age-related macular degeneration, left eye, with active choroidal neovascularization: Secondary | ICD-10-CM | POA: Diagnosis not present

## 2021-10-22 DIAGNOSIS — H353212 Exudative age-related macular degeneration, right eye, with inactive choroidal neovascularization: Secondary | ICD-10-CM | POA: Diagnosis not present

## 2021-10-22 DIAGNOSIS — H43393 Other vitreous opacities, bilateral: Secondary | ICD-10-CM | POA: Diagnosis not present

## 2021-10-22 DIAGNOSIS — H43813 Vitreous degeneration, bilateral: Secondary | ICD-10-CM | POA: Diagnosis not present

## 2021-10-24 DIAGNOSIS — L821 Other seborrheic keratosis: Secondary | ICD-10-CM | POA: Diagnosis not present

## 2021-10-24 DIAGNOSIS — L304 Erythema intertrigo: Secondary | ICD-10-CM | POA: Diagnosis not present

## 2021-10-24 DIAGNOSIS — L570 Actinic keratosis: Secondary | ICD-10-CM | POA: Diagnosis not present

## 2021-10-24 DIAGNOSIS — L578 Other skin changes due to chronic exposure to nonionizing radiation: Secondary | ICD-10-CM | POA: Diagnosis not present

## 2021-11-09 ENCOUNTER — Other Ambulatory Visit: Payer: Self-pay | Admitting: Family Medicine

## 2021-11-09 DIAGNOSIS — Z1231 Encounter for screening mammogram for malignant neoplasm of breast: Secondary | ICD-10-CM

## 2021-11-30 ENCOUNTER — Ambulatory Visit: Payer: Medicare PPO

## 2021-12-06 ENCOUNTER — Other Ambulatory Visit: Payer: Self-pay | Admitting: *Deleted

## 2021-12-06 DIAGNOSIS — R351 Nocturia: Secondary | ICD-10-CM | POA: Diagnosis not present

## 2021-12-06 DIAGNOSIS — R350 Frequency of micturition: Secondary | ICD-10-CM | POA: Diagnosis not present

## 2021-12-06 DIAGNOSIS — M069 Rheumatoid arthritis, unspecified: Secondary | ICD-10-CM

## 2021-12-06 DIAGNOSIS — Z79899 Other long term (current) drug therapy: Secondary | ICD-10-CM

## 2021-12-06 DIAGNOSIS — R3915 Urgency of urination: Secondary | ICD-10-CM | POA: Diagnosis not present

## 2021-12-06 MED ORDER — ENBREL SURECLICK 50 MG/ML ~~LOC~~ SOAJ: 50.0000 mg | SUBCUTANEOUS | 0 refills | Status: DC

## 2021-12-06 NOTE — Telephone Encounter (Signed)
Next Visit: 01/02/2022  Last Visit: 10/09/2021  Last Fill: 09/25/2021  DX: Rheumatoid arthritis, involving unspecified site, unspecified whether rheumatoid factor present   Current Dose per office note 10/09/2021: Enbrel 50 mg [place] weekly  Labs: 10/09/2021 Her inflammatory markers remain normal I believe the reaccumulation of knee swelling is from her osteoarthritis.  Blood count and metabolic panel look normal with no problems to continue the Enbrel.  TB Gold: 07/10/2021 Neg   Okay to refill Enbrel?

## 2021-12-20 NOTE — Progress Notes (Signed)
Office Visit Note  Patient: Meghan Schwartz             Date of Birth: 1951-02-08           MRN: [id number]             PCP: Pcp, No Referring: No ref. provider found Visit Date: 01/02/2022   Subjective:  Follow-up (Doing good)   History of Present Illness: Meghan Schwartz is a 71 y.o. female here for follow up for RA on Enbrel 50 mg [place] weekly. She had been doing pretty well until about 2 weeks ago problems started after she dropped a yeti mug onto her left foot causing initially some pain and swelling in the big toe. She subsequently developed increasing erythema and swelling in the left calf this became substantial and noticed a 7 lb weight gain as well by 10/31. She saw her urology office initially treated with dose of IM rocephin and saw her cardiology office regarding this. She was directed to the ED US obtained was negative for DVT. She was prescribed a course of doxycycline and keflex and has bene taking these for 8 days with 2 days remaining. Pain and swelling is largely improved now at this point, except her left knee is still very swollen. Both anteriorly and large baker's cyst posteriorly this was noted to be about 3 cm diameter and appeared intact at ED [place] study.  Previous HPI 10/09/2021  Meghan Schwartz is a 71 y.o. female here for follow up for RA on Enbrel 50 mg [place]cu weekly.  Overall most of her joint problems have been doing reasonably well but has some increased symptoms affecting her knees especially.  There is increased swelling on both sides currently having more trouble with the left knee reports crepitus and grinding sensations especially with rotational movement and when lying supine.  She has some bruising on the left forearm without specific provoking injury.  However not seeing any joint pain or swelling in those areas.   Previous HPI 08/07/2021 Meghan Schwartz is a 71 y.o. female here for follow up for RA on Enbrel 50 mg [place] weekly. She has made an effort to taking her Enbrel  consistently every week. Her knee pain improved after the last steroid injection about 2 months ago which was her worst arthritis complaint. She continues having trouble with her feet and toes limiting wearing a variety of shoes and affecting her walking. Based on last follow up with Dr. Sharol Given she does not anticipate any additional surgery or procedure intervention for this coming up.    Previous HPI 06/04/2021 Meghan Schwartz is a 71 y.o. female here for follow up for RA on Enbrel 50 mg [place] weekly. She reports varying adherence to treatment plan often missing Enbrel doses and estimates that she takes it about every 10-14 days. She saw Dr. Sharol Given for her painful left toe deformity on February but reports not remembering the plan very well. Foot pain and difficulty walking on this side bothers her daily. Left knee pain and swelling improved after steroid injection but is worse again for the past few weeks. Swelling in the back of the knee bothers her and limits her knee range of movement.   Previous HPI 03/05/21 Meghan Schwartz is a 71 y.o. female here for follow up for RA on Enbrel 50 mg [place] weekly. She also appeared to have considerable osteoarthritis contribution to symptoms in hands and knees as well. Inflammatory markers with mildly elevated  CRP of 11.9. She has been overall well except suffered a bite to her right hand from her dog currently on augmentin after ED visit with total of 14 days prescribed. Left knee pain is worse than usual posteriorly with swelling she reports previous baker's cyst has bothered her since she had knee surgery in the past.   Previous HPI 02/05/21 Meghan Schwartz is a 71 y.o. female her for rheumatoid arthritis on Enbrel 50 mg [place] weekly for which she has been seeing Dr. Trudie Reed previously. She was originally diagnosed about 10 or 11 years ago due to development of joint pain, stiffness, synovitis, and progressive joint deformities primarily starting in the feet and knees.  Subsequently  proceeded to have increased joint pains involving bilateral hands with some swelling at the PIP and MCP joints.  She started treatment with Enbrel injections with some improvement in the lower extremity joint pains she recalls still having a lot of pain symptoms and pronounced fatigue that did not clear with this treatment.  Combination treatment was tried with methotrexate with significant GI intolerance.  She tried leflunomide but developed substantial alopecia so discontinued the medicine.  She switched to hydroxychloroquine combination treatment which was being well-tolerated but she has had multiple new or worsening medical problems over the past few years with concern of medication related effects. She had hand numbness in the past improved after left carpal tunnel release surgery but she has some residual muscle atrophy in thenar prominence. She had left knee meniscectomy in 2020. She suffered NSTEMI in 01/2018 with DES placement in RCA for 99% blockage and has chronic diastolic CHF. Her edema is generally controlled while taking torsemide but does rapidly re accumulate fluid off this medication. Ophthalmology evaluation indicated significant visual field deficits findings consistent with age-related macular degeneration of both eyes and recommended avoidance of this medication.  She also has considerable osteoarthritis especially in her knees and feet previous surgical reconstruction on the left foot. Overall she feels her gait is not very stable due to these problems.   DMARD Hx Enbrel - current   HCQ - macular disease? LEF - alopecia MTX - GI intolerance.    Review of Systems  Constitutional:  Positive for fatigue.  HENT:  Positive for mouth dryness. Negative for mouth sores.   Eyes:  Negative for dryness.  Respiratory:  Positive for shortness of breath.   Cardiovascular:  Negative for chest pain and palpitations.  Gastrointestinal:  Positive for constipation. Negative for blood in stool  and diarrhea.  Endocrine: Positive for increased urination.  Genitourinary:  Positive for involuntary urination.  Musculoskeletal:  Positive for joint pain, gait problem, joint pain, joint swelling, muscle weakness and morning stiffness. Negative for myalgias, muscle tenderness and myalgias.  Skin:  Negative for color change, rash, hair loss and sensitivity to sunlight.  Allergic/Immunologic: Positive for susceptible to infections.  Neurological:  Positive for dizziness and headaches.  Hematological:  Negative for swollen glands.  Psychiatric/Behavioral:  Positive for depressed mood. Negative for sleep disturbance. The patient is nervous/anxious.     PMFS History:  Patient Active Problem List   Diagnosis Date Noted   Leg edema, left 12/25/2021   Claw toe, left 08/07/2021   Dog bite 03/05/2021   High risk medication use 02/05/2021   Dry mouth 02/05/2021   Pain in left foot 01/31/2021   Bunion of great toe of right foot 06/15/2020   Hypoxia 03/05/2019   Derangement of posterior horn of medial meniscus 02/02/2019   Derangement of  posterior horn of lateral meniscus 02/02/2019   Meniscal cyst, unspecified laterality 12/01/2018   Torn medial meniscus 12/01/2018   Primary osteoarthritis of left knee 12/01/2018   Coronary artery disease involving native coronary artery of native heart without angina pectoris 09/14/2018   (HFpEF) heart failure with preserved ejection fraction (HCC)    Chronic pain of left knee 05/15/2018   Mass of left knee 05/15/2018   Non-ST elevation (NSTEMI) myocardial infarction [facility])    Chest pain 01/25/2018   RA (rheumatoid arthritis) ([place]) 01/25/2018   Hyperlipidemia 01/25/2018   Hypokalemia 01/25/2018   Depression with anxiety 01/25/2018   Tobacco abuse 01/25/2018   Lower extremity cellulitis 01/25/2018   Benzodiazepine dependence ([place])    MDD (major depressive disorder), recurrent severe, without psychosis ([place]) 10/02/2017   CTS (carpal tunnel syndrome)  06/08/2014    Past Medical History:  Diagnosis Date   Allergic rhinitis    Anxiety    Benzodiazepine dependence ([place])    Bruxism    CAD (coronary artery disease)    S/p NSTEMI 01/2018 >> LHC: oLAD 22, pLAD 54; oD1 20, oLCx 40, pRCA 99 >> PCI: DES to prox RCA // Echo 01/2018: EF 50-55; prob inf-lat and inf HK   Chronic diastolic heart failure    Echocardiogram 07/2018: EF 63-01, grade 1 diastolic dysfunction, normal wall motion, normal GLS (-22.3), PASP 28   Chronic foot pain    bunions, torn ligaments-on chronic pain medication   CTS (carpal tunnel syndrome) 06/08/2014   Depression    High cholesterol    Hypertension    MDD (major depressive disorder) 10/02/2017   Migraine    Myocardial infarction ([place]) 02/01/2018   Pneumonia 1986   left lung   RA (rheumatoid arthritis) ([place])     Family History  Problem Relation Age of Onset   Thyroid disease Mother    Breast cancer Mother    Parkinson's disease Father    Raynaud syndrome Maternal Aunt    Arthritis Maternal Grandmother    Heart attack Maternal Grandmother    Stroke Paternal Grandmother    Heart attack Paternal Grandfather    Past Surgical History:  Procedure Laterality Date   BTL  2000   CARPAL TUNNEL RELEASE Left    CESAREAN [place] N/A 01/27/2018   Procedure: CORONARY STENT INTERVENTION;  Surgeon: Burnell Blanks, MD;  Location: [place] [facility];  Service: Cardiovascular;  Laterality: N/A;   eye implants     FOOT SURGERY Left 2008   front teeth removed after injury     KNEE ARTHROSCOPY WITH LATERAL MENISECTOMY Left 02/02/2019   Procedure: LEFT KNEE ARTHROSCOPY, MEDIAL AND LATERAL MENISECTOMY, EXCISION OF LEFT LATERAL MENISCAL CYST;  Surgeon: Garald Balding, MD;  Location: [facility];  Service: Orthopedics;  Laterality: Left;   LEFT HEART CATH AND CORONARY ANGIOGRAPHY N/A 01/27/2018   Procedure: LEFT HEART CATH AND CORONARY ANGIOGRAPHY;  Surgeon: Burnell Blanks,  MD;  Location: [place] [facility];  Service: Cardiovascular;  Laterality: N/A;   pre cancerous lesion removed from forehead     TOTAL ABDOMINAL HYSTERECTOMY W/ BILATERAL SALPINGOOPHORECTOMY  2002   Dr. Willis Modena   VAGINAL HYSTERECTOMY  2001   Social History   Social History Narrative   Lives at home with spouse.    Right handed.   Caffeine use: 2 cups coffee/day    8 oz soda/day    Immunization History  Administered Date(s) Administered   PFIZER(Purple Top)SARS-COV-2 Vaccination 04/10/2019,  05/05/2019, 10/19/2019, 06/20/2020   Pfizer Covid-19 Vaccine Bivalent Booster 37yrs & up 11/29/2020   Tdap 04/03/2018     Objective: Vital Signs: BP 125/78 (BP Location: Right Arm, Patient Position: Sitting, Cuff Size: Normal)   Pulse 83   Resp 16   Ht $R'5\' 4"'CE$  (1.626 m)   Wt 160 lb (72.6 kg)   BMI 27.46 kg/m    Physical Exam Cardiovascular:     Rate and Rhythm: Normal rate and regular rhythm.  Pulmonary:     Effort: Pulmonary effort is normal.     Breath sounds: Normal breath sounds.  Musculoskeletal:     Left lower leg: Edema present.  Skin:    General: Skin is warm and dry.     Findings: Rash present.     Comments: Erythema and mild induration on skin of left calf and shin, mild pitting edema  Neurological:     Mental Status: She is alert.  Psychiatric:        Mood and Affect: Mood normal.      Musculoskeletal Exam:  Shoulders full ROM no tenderness or swelling Elbows full ROM no tenderness or swelling Wrists full ROM no tenderness or swelling Fingers full ROM no tenderness or swelling extensive hebredon's nodes on both hands No paraspinal tenderness to palpation over upper and lower back Hip normal internal and external rotation without pain, no tenderness to lateral hip palpation Right knee crepitus, mild tenderness no palpable effusion, left knee with large effusion and baker's cyst posteriorly, mildly decreased ROM   CDAI Exam: CDAI Score: 8  Patient Global: 30 mm;  Provider Global: 20 mm Swollen: 1 ; Tender: 2  Joint Exam 01/02/2022      Right  Left  Knee   Tender  Swollen Tender     Investigation: No additional findings.  Imaging: US Venous Img Lower Unilateral Left  Result Date: 12/25/2021 CLINICAL DATA:  LEFT lower extremity edema EXAM: LEFT LOWER EXTREMITY VENOUS DOPPLER ULTRASOUND TECHNIQUE: Gray-scale sonography with compression, as well as color and duplex ultrasound, were performed to evaluate the deep venous system(s) from the level of the common femoral vein through the popliteal and proximal calf veins. COMPARISON:  None Available. FINDINGS: VENOUS Normal compressibility of the LEFT common femoral, superficial femoral, and popliteal veins, as well as the visualized calf veins. Visualized portions of profunda femoral vein and great saphenous vein unremarkable. No filling defects to suggest DVT on grayscale or color Doppler imaging. Doppler waveforms show normal direction of venous flow, normal respiratory plasticity and response to augmentation. Limited views of the contralateral common femoral vein are unremarkable. OTHER *No evidence of superficial thrombophlebitis. *Subcutaneous edema within the imaged distal LEFT lower extremity *At the posterior LEFT knee within the fossa is a well-circumscribed anechoic and avascular fluid collection measuring approximately 3.1 x 2.8 cm, consistent with a popliteal fossa/Baker cyst. Limitations: Patient body habitus IMPRESSION: 1. No evidence of femoropopliteal DVT or superficial thrombophlebitis within the LEFT lower extremity. 2. 3 cm LEFT popliteal fossa/Baker cyst. Michaelle Birks, MD [facility] Electronically Signed   By: Michaelle Birks M.D.   On: 12/25/2021 19:52   DG Foot Complete Left  Result Date: 12/25/2021 CLINICAL DATA:  Left leg swelling and redness.  Injury EXAM: LEFT FOOT - COMPLETE 3+ VIEW COMPARISON:  None Available. FINDINGS: Negative  for acute fracture Surgical screws in the distal first, second, third metatarsals. Hallux valgus. Degenerative change first MTP. IMPRESSION: Negative for fracture Electronically Signed   By: Juanda Crumble  Chestine Spore M.D.   On: 12/25/2021 19:30   DG Tibia/Fibula Left  Result Date: 12/25/2021 CLINICAL DATA:  Leg pain and redness. EXAM: LEFT TIBIA AND FIBULA - 2 VIEW COMPARISON:  None Available. FINDINGS: Negative for fracture Degenerative change in the knee with joint space narrowing and spurring especially laterally. IMPRESSION: Negative for fracture Electronically Signed   By: Marlan Palau M.D.   On: 12/25/2021 19:29    Recent Labs: Lab Results  Component Value Date   WBC 4.8 12/25/2021   HGB 12.0 12/25/2021   PLT 187 12/25/2021   NA 142 12/31/2021   K 4.8 12/31/2021   CL 99 12/31/2021   CO2 30 (H) 12/31/2021   GLUCOSE 91 12/31/2021   BUN 33 (H) 12/31/2021   CREATININE 1.12 (H) 12/31/2021   BILITOT 0.5 12/25/2021   ALKPHOS 113 12/25/2021   AST 20 12/25/2021   ALT 13 12/25/2021   PROT 7.6 12/25/2021   ALBUMIN 4.2 12/25/2021   CALCIUM 9.5 12/31/2021   GFRAA 65 02/04/2020   QFTBGOLDPLUS NEGATIVE 07/10/2021    Speciality Comments: No specialty comments available.  Procedures:  Large Joint Inj: L knee on 01/02/2022 4:10 PM Indications: pain and joint swelling Details: 22 G 1.5 in needle, superolateral approach Medications: 3 mL lidocaine 1 %; 40 mg triamcinolone acetonide 40 MG/ML Aspirate: 54 mL clear and yellow Outcome: tolerated well, no immediate complications Procedure, treatment alternatives, risks and benefits explained, specific risks discussed. Consent was given by the patient. Immediately prior to procedure a time out was called to verify the correct patient, procedure, equipment, support staff and site/side marked as required. Patient was prepped and draped in the usual sterile fashion.     Allergies: Erythromycin   Assessment / Plan:     Visit Diagnoses: Rheumatoid  arthritis, involving unspecified site, unspecified whether rheumatoid factor present (HCC)  Symptoms appear well controlled mostly symptoms in knees again which are primarily due to OA. Plan is to continue enbrel 50 mg [place] weekly. She had a UTI which is a new problem for her and ten cellulitis of the left leg so there is possible infection susceptibility but these have not been issues since starting treatment before now. If recurrent may need to consider treatment change. Recommend holding Enbrel until completing antibiotics and resume at regular weekly time next Monday 11/13.  High risk medication use - Enbrel 50 mg [place] weekly  Recent labs reviewed from cardiology office and ED visit reviewed. Reviewed CBC wnl, BNP which was mildly above normal, cultures which were negative. BMP showing mild decreased in eGFR but was associated with adjustment in diuretics and cellulitis at the time.  Primary osteoarthritis of left knee  Large effusion and baker's cyst with pain again today. Had significant improvement after last aspiration and fluid analysis has been most consistent with osteoarthritis of the knee as the main cause. Repeat aspiration today tolerated well with large effusion removed.  Bunion of great toe of right foot Claw toe, left  Currently left great toe with nail intact but bruised from recent trauma with dropped metal water bottle.  Orders: Orders Placed This Encounter  Procedures   Large Joint Inj   No orders of the defined types were placed in this encounter.    Follow-Up Instructions: Return in about 3 months (around 04/04/2022) for RA on ENB/inj f/u 34mos.   Fuller Plan, MD  Note - This record has been created using [employer].  Chart creation errors have been sought, but may not always  have been located. Such creation errors do not reflect on  the standard of medical care.

## 2021-12-24 ENCOUNTER — Encounter: Payer: Self-pay | Admitting: Internal Medicine

## 2021-12-24 DIAGNOSIS — R8271 Bacteriuria: Secondary | ICD-10-CM | POA: Diagnosis not present

## 2021-12-24 DIAGNOSIS — N3000 Acute cystitis without hematuria: Secondary | ICD-10-CM | POA: Diagnosis not present

## 2021-12-24 NOTE — Telephone Encounter (Signed)
Error

## 2021-12-25 ENCOUNTER — Encounter: Payer: Self-pay | Admitting: Physician Assistant

## 2021-12-25 ENCOUNTER — Emergency Department (HOSPITAL_BASED_OUTPATIENT_CLINIC_OR_DEPARTMENT_OTHER)
Admission: EM | Admit: 2021-12-25 | Discharge: 2021-12-25 | Disposition: A | Discharge status: Home / Self Care | Payer: Medicare PPO | Attending: Emergency Medicine | Admitting: Emergency Medicine

## 2021-12-25 ENCOUNTER — Ambulatory Visit: Payer: Medicare PPO | Attending: Physician Assistant | Admitting: Physician Assistant

## 2021-12-25 ENCOUNTER — Encounter (HOSPITAL_BASED_OUTPATIENT_CLINIC_OR_DEPARTMENT_OTHER): Payer: Self-pay | Admitting: Emergency Medicine

## 2021-12-25 ENCOUNTER — Other Ambulatory Visit: Payer: Self-pay

## 2021-12-25 ENCOUNTER — Emergency Department (HOSPITAL_BASED_OUTPATIENT_CLINIC_OR_DEPARTMENT_OTHER): Payer: Medicare PPO

## 2021-12-25 ENCOUNTER — Emergency Department (HOSPITAL_BASED_OUTPATIENT_CLINIC_OR_DEPARTMENT_OTHER): Payer: Medicare PPO | Admitting: Radiology

## 2021-12-25 VITALS — BP 122/70 | HR 72 | Ht 64.0 in | Wt 167.0 lb

## 2021-12-25 DIAGNOSIS — I5030 Unspecified diastolic (congestive) heart failure: Secondary | ICD-10-CM | POA: Diagnosis not present

## 2021-12-25 DIAGNOSIS — I5032 Chronic diastolic (congestive) heart failure: Secondary | ICD-10-CM

## 2021-12-25 DIAGNOSIS — Z7902 Long term (current) use of antithrombotics/antiplatelets: Secondary | ICD-10-CM | POA: Diagnosis not present

## 2021-12-25 DIAGNOSIS — R0602 Shortness of breath: Secondary | ICD-10-CM | POA: Insufficient documentation

## 2021-12-25 DIAGNOSIS — L03116 Cellulitis of left lower limb: Secondary | ICD-10-CM | POA: Insufficient documentation

## 2021-12-25 DIAGNOSIS — M7989 Other specified soft tissue disorders: Secondary | ICD-10-CM | POA: Diagnosis not present

## 2021-12-25 DIAGNOSIS — I2510 Atherosclerotic heart disease of native coronary artery without angina pectoris: Secondary | ICD-10-CM

## 2021-12-25 DIAGNOSIS — R600 Localized edema: Secondary | ICD-10-CM | POA: Insufficient documentation

## 2021-12-25 DIAGNOSIS — L03119 Cellulitis of unspecified part of limb: Secondary | ICD-10-CM

## 2021-12-25 DIAGNOSIS — M79605 Pain in left leg: Secondary | ICD-10-CM | POA: Diagnosis not present

## 2021-12-25 DIAGNOSIS — S99922A Unspecified injury of left foot, initial encounter: Secondary | ICD-10-CM | POA: Diagnosis not present

## 2021-12-25 LAB — CBC WITH DIFFERENTIAL/PLATELET
Abs Immature Granulocytes: 0.01 10*3/uL (ref 0.00–0.07)
Basophils Absolute: 0 10*3/uL (ref 0.0–0.1)
Basophils Relative: 1 %
Eosinophils Absolute: 0.3 10*3/uL (ref 0.0–0.5)
Eosinophils Relative: 7 %
HCT: 37.7 % (ref 36.0–46.0)
Hemoglobin: 12 g/dL (ref 12.0–15.0)
Immature Granulocytes: 0 %
Lymphocytes Relative: 30 %
Lymphs Abs: 1.4 10*3/uL (ref 0.7–4.0)
MCH: 26.6 pg (ref 26.0–34.0)
MCHC: 31.8 g/dL (ref 30.0–36.0)
MCV: 83.6 fL (ref 80.0–100.0)
Monocytes Absolute: 0.5 10*3/uL (ref 0.1–1.0)
Monocytes Relative: 10 %
Neutro Abs: 2.5 10*3/uL (ref 1.7–7.7)
Neutrophils Relative %: 52 %
Platelets: 187 10*3/uL (ref 150–400)
RBC: 4.51 MIL/uL (ref 3.87–5.11)
RDW: 14 % (ref 11.5–15.5)
WBC: 4.8 10*3/uL (ref 4.0–10.5)
nRBC: 0 % (ref 0.0–0.2)

## 2021-12-25 LAB — COMPREHENSIVE METABOLIC PANEL
ALT: 13 U/L (ref 0–44)
AST: 20 U/L (ref 15–41)
Albumin: 4.2 g/dL (ref 3.5–5.0)
Alkaline Phosphatase: 113 U/L (ref 38–126)
Anion gap: 7 (ref 5–15)
BUN: 20 mg/dL (ref 8–23)
CO2: 34 mmol/L — ABNORMAL HIGH (ref 22–32)
Calcium: 9.7 mg/dL (ref 8.9–10.3)
Chloride: 101 mmol/L (ref 98–111)
Creatinine, Ser: 1.06 mg/dL — ABNORMAL HIGH (ref 0.44–1.00)
GFR, Estimated: 56 mL/min — ABNORMAL LOW (ref >60)
Glucose, Bld: 100 mg/dL — ABNORMAL HIGH (ref 70–99)
Potassium: 3.9 mmol/L (ref 3.5–5.1)
Sodium: 142 mmol/L (ref 135–145)
Total Bilirubin: 0.5 mg/dL (ref 0.3–1.2)
Total Protein: 7.6 g/dL (ref 6.5–8.1)

## 2021-12-25 LAB — TROPONIN I (HIGH SENSITIVITY): Troponin I (High Sensitivity): <2 ng/L (ref <18)

## 2021-12-25 LAB — BRAIN NATRIURETIC PEPTIDE: B Natriuretic Peptide: 134.2 pg/mL — ABNORMAL HIGH (ref 0.0–100.0)

## 2021-12-25 LAB — LACTIC ACID, PLASMA: Lactic Acid, Venous: 1.4 mmol/L (ref 0.5–1.9)

## 2021-12-25 MED ORDER — DEXTROSE 5 % IV SOLN: 1500.0000 mg | Freq: Once | INTRAVENOUS | Status: DC

## 2021-12-25 MED ORDER — CEFAZOLIN SODIUM-DEXTROSE 1-4 GM/50ML-% IV SOLN
1.0000 g | Freq: Once | INTRAVENOUS | Status: AC
  Administered 2021-12-25: 1 g via INTRAVENOUS
  Filled 2021-12-25: qty 50

## 2021-12-25 MED ORDER — TORSEMIDE 20 MG PO TABS: ORAL_TABLET | ORAL | 3 refills | Status: DC

## 2021-12-25 MED ORDER — CEPHALEXIN 500 MG PO CAPS: 500.0000 mg | ORAL_CAPSULE | Freq: Four times a day (QID) | ORAL | 0 refills | Status: DC

## 2021-12-25 MED ORDER — DOXYCYCLINE HYCLATE 100 MG PO CAPS: 100.0000 mg | ORAL_CAPSULE | Freq: Two times a day (BID) | ORAL | 0 refills | Status: DC

## 2021-12-25 NOTE — Progress Notes (Signed)
Cardiology Office Note:    Date:  12/25/2021   ID:  Meghan Schwartz, DOB [birth year], MRN [id number]  PCP:  Kathyrn Lass  [place] [facility] Cardiologist:  Dorris Carnes, MD    Referring MD: No ref. provider found   Chief Complaint:  Leg Swelling    Patient Profile: Coronary artery disease  S/p NSTEMI 01/2018 >> 3.5 x 28 mm DES to prox RCA Residual on Cath: LAD ostial and proximal 20, D1 ostial 20, LCx ostial 40 Heart failure with preserved ejection fraction  Hx of Leg edema, shortness of breath, BNP mildy elevated; tx with low dose Lasix (twice weekly) Also has L knee lat meniscus tear and ganglion cyst (may contribute some to edema) Echo 1/21: EF 60-65, normal RVSF, trivial MR, AV sclerosis without stenosis Echo 08/2020: EF 60-65, no RWMA, mild asymmetric LVH, GR 1 DD, normal RVSF, normal PASP, RAP 3 Rheumatoid Arthritis  Hyperlipidemia  L knee para meniscal cyst (Dr. Durward Fortes) Aortic atherosclerosis ABIs 12/2018: Normal bilaterally Hepatic steatosis (CT in 7/22)   Cardiac Studies & Procedures   CARDIAC CATHETERIZATION  CARDIAC CATHETERIZATION 01/27/2018  Narrative  Prox RCA to Mid RCA lesion is 99% stenosed.  Ost LAD to Prox LAD lesion is 20% stenosed.  Prox LAD lesion is 20% stenosed.  Ost Cx to Prox Cx lesion is 40% stenosed.  Ost 1st Diag lesion is 20% stenosed.  A drug-eluting stent was successfully placed using a STENT SYNERGY DES 3.5X28.  Post intervention, there is a 0% residual stenosis.  1. Severe, ulcerated stenosis mid RCA 2. Mild non-obstructive disease in the LAD and Circumflex 3. Successful PTCA/DES x mid RCA  Recommendations: DAPT with ASA and Brilinta for one year. Continue statin. Will start a beta blocker. Smoking cessation. Likely d/c home in the am if stable.  Findings Coronary Findings Diagnostic  Dominance: Right  Left Anterior Descending Ost LAD to Prox LAD lesion is 20% stenosed. Prox LAD lesion is 20% stenosed.  First  Diagonal Branch Vessel is moderate in size. Ost 1st Diag lesion is 20% stenosed.  Left Circumflex Ost Cx to Prox Cx lesion is 40% stenosed.  Right Coronary Artery Vessel is large. Prox RCA to Mid RCA lesion is 99% stenosed.  Intervention  Prox RCA to Mid RCA lesion Stent Pre-stent angioplasty was performed using a BALLOON SAPPHIRE 2.5X20. A drug-eluting stent was successfully placed using a STENT SYNERGY DES 3.5X28. Stent strut is well apposed. Post-stent angioplasty was performed using a BALLOON SAPPHIRE [place] [id number]. Post-Intervention Lesion Assessment The intervention was successful. Pre-interventional TIMI flow is 3. Post-intervention TIMI flow is 3. No complications occurred at this lesion. There is a 0% residual stenosis post intervention.     ECHOCARDIOGRAM  ECHOCARDIOGRAM COMPLETE 09/21/2020  Narrative ECHOCARDIOGRAM REPORT    Patient Name:   Meghan Schwartz Date of Exam: 09/21/2020 Medical Rec #:  [id number]    Height:       64.0 in Accession #:    [id number]   Weight:       173.8 lb Date of Birth:  1950-05-04    BSA:          1.843 m Patient Age:    71 years     BP:           122/68 mmHg Patient Gender: F            HR:           72 bpm. Exam Location:  Outpatient  Procedure: 2D Echo  Indications:    CAD Native Vessel I25.10  History:        Patient has prior history of Echocardiogram examinations, most recent 03/06/2019. Previous Myocardial Infarction and CAD; Risk Factors:Hypertension.  Sonographer:    Mikki Santee RDCS (AE) Referring Phys: [id number] PAULA V ROSS  IMPRESSIONS   1. Left ventricular ejection fraction, by estimation, is 60 to 65%. The left ventricle has normal function. The left ventricle has no regional wall motion abnormalities. There is mild asymmetric left ventricular hypertrophy of the basal-septal segment. Left ventricular diastolic parameters are consistent with Grade I diastolic dysfunction (impaired relaxation). 2. Right ventricular  systolic function is normal. The right ventricular size is normal. There is normal pulmonary artery systolic pressure. 3. The mitral valve is normal in structure. No evidence of mitral valve regurgitation. No evidence of mitral stenosis. 4. The aortic valve is tricuspid. Aortic valve regurgitation is not visualized. No aortic stenosis is present. 5. The inferior vena cava is normal in size with greater than 50% respiratory variability, suggesting right atrial pressure of 3 mmHg.  FINDINGS Left Ventricle: Left ventricular ejection fraction, by estimation, is 60 to 65%. The left ventricle has normal function. The left ventricle has no regional wall motion abnormalities. The left ventricular internal cavity size was normal in size. There is mild asymmetric left ventricular hypertrophy of the basal-septal segment. Left ventricular diastolic parameters are consistent with Grade I diastolic dysfunction (impaired relaxation). Indeterminate filling pressures.  Right Ventricle: The right ventricular size is normal. No increase in right ventricular wall thickness. Right ventricular systolic function is normal. There is normal pulmonary artery systolic pressure. The tricuspid regurgitant velocity is 1.67 m/s, and with an assumed right atrial pressure of 3 mmHg, the estimated right ventricular systolic pressure is 00.3 mmHg.  Left Atrium: Left atrial size was normal in size.  Right Atrium: Right atrial size was normal in size.  Pericardium: There is no evidence of pericardial effusion.  Mitral Valve: The mitral valve is normal in structure. No evidence of mitral valve regurgitation. No evidence of mitral valve stenosis.  Tricuspid Valve: The tricuspid valve is normal in structure. Tricuspid valve regurgitation is trivial. No evidence of tricuspid stenosis.  Aortic Valve: The aortic valve is tricuspid. Aortic valve regurgitation is not visualized. No aortic stenosis is present.  Pulmonic Valve: The  pulmonic valve was normal in structure. Pulmonic valve regurgitation is not visualized. No evidence of pulmonic stenosis.  Aorta: The aortic root is normal in size and structure.  Venous: The inferior vena cava is normal in size with greater than 50% respiratory variability, suggesting right atrial pressure of 3 mmHg.  IAS/Shunts: No atrial level shunt detected by color flow Doppler.   LEFT VENTRICLE PLAX 2D LVIDd:         4.00 cm     Diastology LVIDs:         2.70 cm     LV e' medial:    6.64 cm/s LV PW:         0.90 cm     LV E/e' medial:  11.9 LV IVS:        1.10 cm     LV e' lateral:   8.59 cm/s LVOT diam:     2.10 cm     LV E/e' lateral: 9.2 LV SV:         62 LV SV Index:   33 LVOT Area:     3.46 cm  LV Volumes (MOD) LV vol d, MOD A2C: 44.1 ml  LV vol d, MOD A4C: 57.5 ml LV vol s, MOD A2C: 17.9 ml LV vol s, MOD A4C: 18.9 ml LV SV MOD A2C:     26.2 ml LV SV MOD A4C:     57.5 ml LV SV MOD BP:      33.1 ml  RIGHT VENTRICLE RV S prime:     10.80 cm/s TAPSE (M-mode): 2.1 cm  LEFT ATRIUM             Index       RIGHT ATRIUM           Index LA diam:        2.80 cm 1.52 cm/m  RA Area:     18.70 cm LA Vol (A2C):   31.7 ml 17.20 ml/m RA Volume:   50.40 ml  27.35 ml/m LA Vol (A4C):   27.1 ml 14.70 ml/m LA Biplane Vol: 30.5 ml 16.55 ml/m AORTIC VALVE LVOT Vmax:   83.30 cm/s LVOT Vmean:  55.700 cm/s LVOT VTI:    0.178 m  AORTA Ao Root diam: 3.10 cm  MITRAL VALVE                TRICUSPID VALVE MV Area (PHT): 3.53 cm     TR Peak grad:   11.2 mmHg MV Decel Time: 215 msec     TR Vmax:        167.00 cm/s MV E velocity: 79.10 cm/s MV A velocity: 102.00 cm/s  SHUNTS MV E/A ratio:  0.78         Systemic VTI:  0.18 m Systemic Diam: 2.10 cm  Skeet Latch MD Electronically signed by Skeet Latch MD Signature Date/Time: 09/21/2020/4:19:22 PM    Final              History of Present Illness:   AREANNA GENGLER is a 71 y.o. female with the above problem list.  She  was last seen by Dr. Harrington Challenger in April 2023.      She returns for the evaluation of left lower extremity edema.  She dropped a cup on her left great toe about a week ago.  Since then, her left leg has been significantly swollen and red.  It is tender to the touch.  She has also noted about a 10 pound weight gain as well as increased dyspnea exertion.  She has not had orthopnea, PND, chest pain or syncope.  She has not had fevers or chills.  She saw her urologist yesterday.  She was diagnosed with a UTI.  She was given an injection of an antibiotic.  It sounds as though she was given an IM injection of Rocephin.  She has had cellulitis in the past in her left leg.  She has not had a DVT.    Past Medical History:  Diagnosis Date   Allergic rhinitis    Anxiety    Benzodiazepine dependence (HCC)    Bruxism    CAD (coronary artery disease)    S/p NSTEMI 01/2018 >> LHC: oLAD 20, pLAD 20; oD1 20, oLCx 40, pRCA 99 >> PCI: DES to prox RCA // Echo 01/2018: EF 50-55; prob inf-lat and inf HK   Chronic diastolic heart failure    Echocardiogram 07/2018: EF 83-38, grade 1 diastolic dysfunction, normal wall motion, normal GLS (-22.3), PASP 28   Chronic foot pain    bunions, torn ligaments-on chronic pain medication   CTS (carpal tunnel syndrome) 06/08/2014   Depression    High cholesterol  Hypertension    MDD (major depressive disorder) 10/02/2017   Migraine    Myocardial infarction ([place]) 02/01/2018   Pneumonia 1986   left lung   RA (rheumatoid arthritis) (HCC)    Current Medications: Current Meds  Medication Sig   acetaminophen (TYLENOL) 500 MG tablet Take 500 mg by mouth every 6 (six) hours as needed (For knee pain).   cephALEXin (KEFLEX) 500 MG capsule Take 1 capsule (500 mg total) by mouth 4 (four) times daily.   Cetirizine HCl (ZYRTEC PO) Take 1 capsule by mouth daily.   cholecalciferol (VITAMIN D3) 25 MCG (1000 UT) tablet Take 1,000 Units by mouth daily.   citalopram (CELEXA) 10 MG tablet TK 1 T  PO TID   clonazePAM (KLONOPIN) 1 MG tablet Take 0.5-1 mg by mouth See admin instructions. 0.5 mg midday, 1 mg at bedtime   cloNIDine (CATAPRES) 0.1 MG tablet Take 0.1 mg by mouth daily.   clopidogrel (PLAVIX) 75 MG tablet TAKE 1 TABLET BY MOUTH EVERY DAY   escitalopram (LEXAPRO) 10 MG tablet Take 10 mg by mouth daily.    estradiol (VIVELLE-DOT) 0.075 MG/24HR Place 1 patch onto the skin 2 (two) times a week. Tuesday and Friday   etanercept (ENBREL SURECLICK) 50 MG/ML injection Inject 50 mg into the skin once a week.   hydrOXYzine (ATARAX) 25 MG tablet Take 25-50 mg by mouth 2 (two) times daily as needed.   meloxicam (MOBIC) 15 MG tablet TK 1 T PO QD   metoprolol tartrate (LOPRESSOR) 25 MG tablet TAKE 1 TABLET BY MOUTH EVERY DAY   mirabegron ER (MYRBETRIQ) 25 MG TB24 tablet 1 tablet   Multiple Vitamins-Minerals (MULTIVITAMIN WITH MINERALS) tablet Take 1 tablet by mouth daily.   nitroGLYCERIN (NITROSTAT) 0.4 MG SL tablet Place 1 tablet (0.4 mg total) under the tongue every 5 (five) minutes as needed for chest pain.   ondansetron (ZOFRAN-ODT) 4 MG disintegrating tablet Take 4 mg by mouth as needed.   potassium chloride SA (KLOR-CON M20) 20 MEQ tablet TAKE 2 TABLETS (40 MEQ TOTAL) BY MOUTH 3 (THREE) TIMES DAILY.   PRESCRIPTION MEDICATION Apply 1 application onto the skin and legs for scar (UREA Cream)   rosuvastatin (CRESTOR) 40 MG tablet Take 1 tablet (40 mg total) by mouth daily.   torsemide (DEMADEX) 20 MG tablet TAKE 2 TABLETS BY MOUTH IN THE AM, TAKE 1 TABLET BY MOUTH IN THE PM   triamcinolone cream (KENALOG) 0.1 % Apply topically 2 (two) times daily.   ZUBSOLV 5.7-1.4 MG SUBL Place 1 tablet under the tongue 3 (three) times daily.   [DISCONTINUED] torsemide (DEMADEX) 20 MG tablet TAKE 1 TABLET BY MOUTH TWICE A DAY    Allergies:   Erythromycin   Social History   Tobacco Use   Smoking status: Every Day    Packs/day: 0.50    Years: 20.00    Total pack years: 10.00    Types: Cigarettes    Smokeless tobacco: Never   Tobacco comments:    Restarted smoking earlier this year. smokes during stressful time 2 cigs daily  Vaping Use   Vaping Use: Never used  Substance Use Topics   Alcohol use: No    Alcohol/week: 0.0 standard drinks of alcohol   Drug use: No    Family Hx: The patient's family history includes Arthritis in her maternal grandmother; Breast cancer in her mother; Heart attack in her maternal grandmother and paternal grandfather; Parkinson's disease in her father; Raynaud syndrome in her maternal aunt; Stroke in her  paternal grandmother; Thyroid disease in her mother.  Review of Systems  Constitutional: Negative for chills and fever.  Gastrointestinal:  Negative for hematochezia.  Genitourinary:  Positive for dysuria and frequency. Negative for hematuria.     EKGs/Labs/Other Test Reviewed:    EKG:  EKG is not ordered today.    Recent Labs: 06/13/2021: NT-Pro BNP 139 10/09/2021: ALT 12; BUN 23; Creat 1.10; Hemoglobin 12.2; Platelets 192; Potassium 4.4; Sodium 144   Recent Lipid Panel No results for input(s): "CHOL", "TRIG", "HDL", "VLDL", "LDLCALC", "LDLDIRECT" in the last 8760 hours.   Risk Assessment/Calculations/Metrics:              Physical Exam:    VS:  BP 122/70   Pulse 72   Ht 5' 4" (1.626 m)   Wt 167 lb (75.8 kg)   SpO2 95%   BMI 28.67 kg/m     Wt Readings from Last 3 Encounters:  12/25/21 167 lb (75.8 kg)  10/09/21 159 lb 9.6 oz (72.4 kg)  08/07/21 160 lb (72.6 kg)    Constitutional:      Appearance: Healthy appearance. Not in distress.  Neck:     Vascular: JVD elevated.  Pulmonary:     Effort: Pulmonary effort is normal.     Breath sounds: No wheezing. No rales.  Cardiovascular:     Normal rate. Regular rhythm. Normal S1. Normal S2.      Murmurs: There is no murmur.  Edema:    Peripheral edema present.    Pretibial: 2+ edema of the left pretibial area.    Ankle: 2+ edema of the left ankle.    Comments: +Brawny erythema  (rubor); + tenderness (dolor); Warm to the touch but not true calor Abdominal:     General: There is distension.     Palpations: Abdomen is soft.  Skin:    General: Skin is warm and dry.  Neurological:     Mental Status: Alert and oriented to person, place and time.          ASSESSMENT & PLAN:   Leg edema, left She had an injury to her L great toe a week ago. Since then, she has had unilateral swelling and redness as well as pain. She is also volume overloaded but her R leg is not significantly swollen. The differential diagnosis include lower ext cellulitis vs DVT. We contacted our vascular dept. However, they are unable to do an [place] on her leg today. Given the appearance of her leg, I do not think she should wait. I have recommended that she go to the ED tonight to have an [place]. We discussed the potential for serious consequences of a DVT is not treated appropriately. Cephalexin 500 mg every 6 hours x 7 days CMET, CBC w diff Pt to go to ED tonight for venous duplex to rule out DVT F/u 1 week  (HFpEF) heart failure with preserved ejection fraction ([place]) She has gained about 10 lbs at home. Her JVP is elevated and her abd is distended. Luckily her lungs are clear. Her LLE edema is a separate issue. Increase Torsemide to 40 mg in the A and 20 mg in the P She is only taking K+ 20 mEq three times a day >> increase to 40 mEq three times a day as originally Rx'd CMET, CBC today Repeat BMET in 1 week F/u in 1 week.  Coronary artery disease involving native coronary artery of native heart without angina pectoris History of non-STEMI in 2019 treated a DES  to the RCA.  She is not having anginal symptoms.  Continue Plavix 75 mg daily, Lopressor 25 mg daily, as needed nitroglycerin and Crestor 40 mg daily.            Dispo:  Return in about 1 week (around 01/01/2022) for Close Follow Up, w/ Dr. Harrington Challenger, or Richardson Dopp, PA-C.   Medication Adjustments/Labs and Tests Ordered: Current medicines are  reviewed at length with the patient today.  Concerns regarding medicines are outlined above.  Tests Ordered: Orders Placed This Encounter  Procedures   Comp Met (CMET)   CBC w/Diff   Basic metabolic panel   Medication Changes: Meds ordered this encounter  Medications   torsemide (DEMADEX) 20 MG tablet    Sig: TAKE 2 TABLETS BY MOUTH IN THE AM, TAKE 1 TABLET BY MOUTH IN THE PM    Dispense:  270 tablet    Refill:  3   cephALEXin (KEFLEX) 500 MG capsule    Sig: Take 1 capsule (500 mg total) by mouth 4 (four) times daily.    Dispense:  28 capsule    Refill:  0   Signed, Richardson Dopp, PA-C  12/25/2021 5:14 PM    [address], [place], [place]  [address] Phone: [phone]; Fax: [phone]

## 2021-12-25 NOTE — ED Notes (Signed)
Pt verbalizes understanding of discharge instructions. Opportunity for questioning and answers were provided. Pt discharged from ED to home with husband.    

## 2021-12-25 NOTE — ED Provider Notes (Signed)
[facility]ER[facility] DEPT Provider Note   CSN: [id number] Arrival date & time: 12/25/21  1816     History  Chief Complaint  Patient presents with   Leg Swelling    Meghan Schwartz is a 71 y.o. female.  Patient with a history of diastolic heart failure, CAD, previous MI, rheumatoid arthritis and anxiety, sent from cardiology office with left leg pain and swelling.  They requested DVT study.  States she dropped a heavy cup of water on her foot about 1 week ago landing directly on left right toe while wearing sandals.  Since then she is having increasing pain and redness and swelling involving the entire left leg up to her knee with some firm nodules behind her right knee.  Right leg is not swollen.  Does have "tightness" without significant pain.  No fever, chills, nausea or vomiting.  Has had shortness of breath increasing over the past several days and cardiologist today adjusted her diuretics to 40 mg of torsemide in the morning and 20 mg at night.  No chest pain.  No cough or fever.  Had a blood clot as a child but is not on blood thinners currently.  Does take Plavix.  Unclear if she had a DVT previously.  Complains of pain to her left great toe, foot and ankle and lower leg  She also saw her urologist yesterday and was treated for a UTI.  She was given a shot of what sounds like Rocephin and states that her UTI feels better.        Home Medications Prior to Admission medications   Medication Sig Start Date End Date Taking? Authorizing Provider  acetaminophen (TYLENOL) 500 MG tablet Take 500 mg by mouth every 6 (six) hours as needed (For knee pain).    [provider]  cephALEXin (KEFLEX) 500 MG capsule Take 1 capsule (500 mg total) by mouth 4 (four) times daily. 12/25/21   Richardson Dopp T, PA-C  Cetirizine HCl (ZYRTEC PO) Take 1 capsule by mouth daily.    [provider]  cholecalciferol (VITAMIN D3) 25 MCG (1000 UT) tablet Take 1,000 Units by mouth  daily.    [provider]  citalopram (CELEXA) 10 MG tablet TK 1 T PO TID    [provider]  clonazePAM (KLONOPIN) 1 MG tablet Take 0.5-1 mg by mouth See admin instructions. 0.5 mg midday, 1 mg at bedtime 12/31/17   [provider]  cloNIDine (CATAPRES) 0.1 MG tablet Take 0.1 mg by mouth daily. 11/12/19   [provider]  clopidogrel (PLAVIX) 75 MG tablet TAKE 1 TABLET BY MOUTH EVERY DAY 06/25/21   Fay Records, MD  escitalopram (LEXAPRO) 10 MG tablet Take 10 mg by mouth daily.     [provider]  estradiol (VIVELLE-DOT) 0.075 MG/24HR Place 1 patch onto the skin 2 (two) times a week. Tuesday and Friday    [provider]  etanercept (ENBREL SURECLICK) 50 MG/ML injection Inject 50 mg into the skin once a week. 12/06/21   Rice, Resa Miner, MD  hydrOXYzine (ATARAX) 25 MG tablet Take 25-50 mg by mouth 2 (two) times daily as needed. 02/24/21   [provider]  meloxicam (MOBIC) 15 MG tablet TK 1 T PO QD    [provider]  metoprolol tartrate (LOPRESSOR) 25 MG tablet TAKE 1 TABLET BY MOUTH EVERY DAY 09/25/21   Fay Records, MD  mirabegron ER (MYRBETRIQ) 25 MG TB24 tablet 1 tablet    [provider]  Multiple Vitamins-Minerals (MULTIVITAMIN WITH MINERALS) tablet Take 1 tablet by mouth daily.    [provider]  nitroGLYCERIN (NITROSTAT) 0.4 MG SL tablet Place 1 tablet (0.4 mg total) under the tongue every 5 (five) minutes as needed for chest pain. 01/28/18   Rai, Ripudeep K, MD  ondansetron (ZOFRAN-ODT) 4 MG disintegrating tablet Take 4 mg by mouth as needed. 02/23/21   [provider]  potassium chloride SA (KLOR-CON M20) 20 MEQ tablet TAKE 2 TABLETS (40 MEQ TOTAL) BY MOUTH 3 (THREE) TIMES DAILY. 10/23/21   Pricilla Riffle, MD  PRESCRIPTION MEDICATION Apply 1 application onto the skin and legs for scar (UREA Cream)    [provider]  rosuvastatin (CRESTOR) 40 MG tablet Take 1 tablet (40 mg total) by  mouth daily. 06/25/21   Pricilla Riffle, MD  torsemide (DEMADEX) 20 MG tablet TAKE 2 TABLETS BY MOUTH IN THE AM, TAKE 1 TABLET BY MOUTH IN THE PM 12/25/21   Tereso Newcomer T, PA-C  triamcinolone cream (KENALOG) 0.1 % Apply topically 2 (two) times daily. 12/26/20   [provider]  ZUBSOLV 5.7-1.4 MG SUBL Place 1 tablet under the tongue 3 (three) times daily. 12/30/18   [provider]      Allergies    Erythromycin    Review of Systems   Review of Systems  Constitutional:  Negative for activity change, appetite change and fever.  HENT:  Negative for congestion and rhinorrhea.   Respiratory:  Positive for shortness of breath. Negative for cough and chest tightness.   Cardiovascular:  Positive for leg swelling.  Gastrointestinal:  Negative for abdominal pain, nausea and vomiting.  Genitourinary:  Negative for dysuria and hematuria.  Musculoskeletal:  Negative for arthralgias.  Skin:  Positive for rash and wound.  Neurological:  Negative for dizziness, weakness and headaches.   all other systems are negative except as noted in the HPI and PMH.    Physical Exam Updated Vital Signs BP 135/85   Pulse 69   Temp 97.6 F (36.4 C) (Oral)   Resp 16   Ht 5\' 4"  (1.626 m)   Wt 75.8 kg   SpO2 99%   BMI 28.67 kg/m  Physical Exam Vitals and nursing note reviewed.  Constitutional:      General: She is not in acute distress.    Appearance: She is well-developed.  HENT:     Head: Normocephalic and atraumatic.     Mouth/Throat:     Pharynx: No oropharyngeal exudate.  Eyes:     Conjunctiva/sclera: Conjunctivae normal.     Pupils: Pupils are equal, round, and reactive to light.  Neck:     Comments: No meningismus. Cardiovascular:     Rate and Rhythm: Normal rate and regular rhythm.     Heart sounds: Normal heart sounds. No murmur heard. Pulmonary:     Effort: Pulmonary effort is normal. No respiratory distress.     Breath sounds: Normal breath sounds.  Abdominal:      Palpations: Abdomen is soft.     Tenderness: There is no abdominal tenderness. There is no guarding or rebound.  Musculoskeletal:        General: Swelling present. No tenderness. Normal range of motion.     Cervical back: Normal range of motion and neck supple.     Right lower leg: Edema present.     Left lower leg: Edema present.     Comments: Left leg erythema and edema.  Intact DP and PT  pulse.  Skin:    General: Skin is warm.  Neurological:     Mental Status: She is alert and oriented to person, place, and time.     Cranial Nerves: No cranial nerve deficit.     Motor: No abnormal muscle tone.     Coordination: Coordination normal.     Comments:  5/5 strength throughout. CN 2-12 intact.Equal grip strength.   Psychiatric:        Behavior: Behavior normal.        ED Results / Procedures / Treatments   Labs (all labs ordered are listed, but only abnormal results are displayed) Labs Reviewed  COMPREHENSIVE METABOLIC PANEL - Abnormal; Notable for the following components:      Result Value   CO2 34 (*)    Glucose, Bld 100 (*)    Creatinine, Ser 1.06 (*)    GFR, Estimated 56 (*)    All other components within normal limits  BRAIN NATRIURETIC PEPTIDE - Abnormal; Notable for the following components:   B Natriuretic Peptide 134.2 (*)    All other components within normal limits  CULTURE, BLOOD (ROUTINE X 2)  CULTURE, BLOOD (ROUTINE X 2)  CULTURE, BLOOD (ROUTINE X 2)  CBC WITH DIFFERENTIAL/PLATELET  LACTIC ACID, PLASMA  TROPONIN I (HIGH SENSITIVITY)  TROPONIN I (HIGH SENSITIVITY)    EKG None  Radiology US Venous Img Lower Unilateral Left  Result Date: 12/25/2021 CLINICAL DATA:  LEFT lower extremity edema EXAM: LEFT LOWER EXTREMITY VENOUS DOPPLER ULTRASOUND TECHNIQUE: Gray-scale sonography with compression, as well as color and duplex ultrasound, were performed to evaluate the deep venous system(s) from the level of the common femoral vein through the popliteal and  proximal calf veins. COMPARISON:  None Available. FINDINGS: VENOUS Normal compressibility of the LEFT common femoral, superficial femoral, and popliteal veins, as well as the visualized calf veins. Visualized portions of profunda femoral vein and great saphenous vein unremarkable. No filling defects to suggest DVT on grayscale or color Doppler imaging. Doppler waveforms show normal direction of venous flow, normal respiratory plasticity and response to augmentation. Limited views of the contralateral common femoral vein are unremarkable. OTHER *No evidence of superficial thrombophlebitis. *Subcutaneous edema within the imaged distal LEFT lower extremity *At the posterior LEFT knee within the fossa is a well-circumscribed anechoic and avascular fluid collection measuring approximately 3.1 x 2.8 cm, consistent with a popliteal fossa/Baker cyst. Limitations: Patient body habitus IMPRESSION: 1. No evidence of femoropopliteal DVT or superficial thrombophlebitis within the LEFT lower extremity. 2. 3 cm LEFT popliteal fossa/Baker cyst. Roanna Banning, MD [facility] Electronically Signed   By: Roanna Banning M.D.   On: 12/25/2021 19:52   DG Foot Complete Left  Result Date: 12/25/2021 CLINICAL DATA:  Left leg swelling and redness.  Injury EXAM: LEFT FOOT - COMPLETE 3+ VIEW COMPARISON:  None Available. FINDINGS: Negative for acute fracture Surgical screws in the distal first, second, third metatarsals. Hallux valgus. Degenerative change first MTP. IMPRESSION: Negative for fracture Electronically Signed   By: Marlan Palau M.D.   On: 12/25/2021 19:30   DG Tibia/Fibula Left  Result Date: 12/25/2021 CLINICAL DATA:  Leg pain and redness. EXAM: LEFT TIBIA AND FIBULA - 2 VIEW COMPARISON:  None Available. FINDINGS: Negative for fracture Degenerative change in the knee with joint space narrowing and spurring especially laterally. IMPRESSION: Negative for fracture  Electronically Signed   By: Marlan Palau M.D.   On: 12/25/2021 19:29    Procedures Procedures  Medications Ordered in ED Medications - No data to display  ED Course/ Medical Decision Making/ A&P                           Medical Decision Making Amount and/or Complexity of Data Reviewed Labs: ordered. Decision-making details documented in ED Course. Radiology: ordered and independent interpretation performed. Decision-making details documented in ED Course. ECG/medicine tests: ordered and independent interpretation performed. Decision-making details documented in ED Course.  Risk Prescription drug management.   1 week of left leg pain and swelling.  Sent by cardiology for DVT study.  Prescribed Keflex today which she has not yet started.  Also some shortness of breath for the past 2 days with recent adjustments of her diuretics.  Ultrasound is negative for DVT.  Does show a Baker's cyst. Xrays negative for foreign body or soft tissue gas.  Work-up is reassuring with normal creatinine and electrolytes.  Chest x-ray is negative without edema.  Patient appears to have extensive cellulitis involving her left leg and foot.  She does not want to be admitted to the hospital which was offered to her.  She was given Keflex today which she has not yet started.  She is agreeable to a dose of Dalvance in the ED.  Dalvance not available at this facility.  Patient has no fever, SIRS or leukocytosis.  Low suspicion for MRSA. D/w Christiane Ha of pharmacy. Will initiate IV Ancef and she will take her Keflex at home.  We will also add doxycycline.  Patient again declines admission to the hospital.  She does not appear to be toxic or septic.  No fever.  She is given IV Ancef here.  We will add doxycycline to the Keflex that was prescribed earlier today.  Follow-up with PCP for wound check in 2 days, return to the ED sooner with worsening redness, pain, swelling, fever, any other  concerns.       Final Clinical Impression(s) / ED Diagnoses Final diagnoses:  Left leg cellulitis    Rx / DC Orders ED Discharge Orders     None         Evan Osburn, Jeannett Senior, MD 12/25/21 2317

## 2021-12-25 NOTE — ED Notes (Signed)
US at bedside

## 2021-12-25 NOTE — ED Triage Notes (Signed)
Pt arrives pov, slow gait, c/o LT leg swelling with redness after dropping a heavy yeti cup on LT foot. Endorses thinners. Referred by cardio to r/o DVT. Pt also reports increased shob over past 2 days

## 2021-12-25 NOTE — Assessment & Plan Note (Addendum)
She had an injury to her L great toe a week ago. Since then, she has had unilateral swelling and redness as well as pain. She is also volume overloaded but her R leg is not significantly swollen. The differential diagnosis include lower ext cellulitis vs DVT. We contacted our vascular dept. However, they are unable to do an [place] on her leg today. Given the appearance of her leg, I do not think she should wait. I have recommended that she go to the ED tonight to have an [place]. We discussed the potential for serious consequences of a DVT is not treated appropriately. Cephalexin 500 mg every 6 hours x 7 days CMET, CBC w diff Pt to go to ED tonight for venous duplex to rule out DVT F/u 1 week

## 2021-12-25 NOTE — Discharge Instructions (Signed)
No evidence of blood clot.  You declined admission to the hospital today.  Continue the Keflex you are prescribed and add doxycycline.  Follow-up with your doctor for recheck of your leg in 2 days to make sure it is improving.  Return to the ED with worsening pain, redness, fever, chills, nausea, vomiting or other concerns.

## 2021-12-25 NOTE — Patient Instructions (Signed)
Medication Instructions:  Your physician has recommended you make the following change in your medication:   INCREASE the Torsemide to 20 mg taking 2 in the morning and 1 in the afternoon  INCREASE the Potassium back to 2 tablets three times a day 3.   START Keflex 500 mg taking 1 tablet every 6 hours daily for 7 days   *If you need a refill on your cardiac medications before your next appointment, please call your pharmacy*   Lab Work: TODAY:  CMET & CBC W/DIFF  1 WEEK :  BMET   If you have labs (blood work) drawn today and your tests are completely normal, you will receive your results only by: [facility] (if you have MyChart) OR A paper copy in the mail If you have any lab test that is abnormal or we need to change your treatment, we will call you to review the results.   Testing/Procedures: None ordered   Follow-Up: At [facility], you and your health needs are our priority.  As part of our continuing mission to provide you with exceptional heart care, we have created designated Provider Care Teams.  These Care Teams include your primary Cardiologist (physician) and Advanced Practice Providers (APPs -  Physician Assistants and Nurse Practitioners) who all work together to provide you with the care you need, when you need it.  We recommend signing up for the patient portal called "MyChart".  Sign up information is provided on this After Visit Summary.  MyChart is used to connect with patients for Virtual Visits (Telemedicine).  Patients are able to view lab/test results, encounter notes, upcoming appointments, etc.  Non-urgent messages can be sent to your provider as well.   To learn more about what you can do with MyChart, go to [email or web address].    Your next appointment:   1 week(s)  The format for your next appointment:   In Person  Provider:   Dorris Carnes, MD  or Richardson Dopp, PA-C         Other Instructions Go to the ED for your left leg edema /  redness  Important Information About Sugar      3

## 2021-12-25 NOTE — ED Notes (Signed)
Pt sustained

## 2021-12-25 NOTE — Assessment & Plan Note (Signed)
She has gained about 10 lbs at home. Her JVP is elevated and her abd is distended. Luckily her lungs are clear. Her LLE edema is a separate issue. Increase Torsemide to 40 mg in the A and 20 mg in the P She is only taking K+ 20 mEq three times a day >> increase to 40 mEq three times a day as originally Rx'd CMET, CBC today Repeat BMET in 1 week F/u in 1 week.

## 2021-12-25 NOTE — Assessment & Plan Note (Signed)
History of non-STEMI in 2019 treated a DES to the RCA.  She is not having anginal symptoms.  Continue Plavix 75 mg daily, Lopressor 25 mg daily, as needed nitroglycerin and Crestor 40 mg daily.

## 2021-12-25 NOTE — ED Notes (Signed)
Pt sustained 95%, HR 80-90 while ambulating. MD notified.

## 2021-12-26 ENCOUNTER — Telehealth: Payer: Self-pay | Admitting: Physician Assistant

## 2021-12-26 LAB — CBC WITH DIFFERENTIAL/PLATELET
Basophils Absolute: 0 10*3/uL (ref 0.0–0.2)
Basos: 1 %
EOS (ABSOLUTE): 0.3 10*3/uL (ref 0.0–0.4)
Eos: 7 %
Hematocrit: 37.2 % (ref 34.0–46.6)
Hemoglobin: 11.9 g/dL (ref 11.1–15.9)
Immature Grans (Abs): 0 10*3/uL (ref 0.0–0.1)
Immature Granulocytes: 0 %
Lymphocytes Absolute: 1.5 10*3/uL (ref 0.7–3.1)
Lymphs: 31 %
MCH: 25.8 pg — ABNORMAL LOW (ref 26.6–33.0)
MCHC: 32 g/dL (ref 31.5–35.7)
MCV: 81 fL (ref 79–97)
Monocytes Absolute: 0.5 10*3/uL (ref 0.1–0.9)
Monocytes: 11 %
Neutrophils Absolute: 2.4 10*3/uL (ref 1.4–7.0)
Neutrophils: 50 %
Platelets: 201 10*3/uL (ref 150–450)
RBC: 4.61 x10E6/uL (ref 3.77–5.28)
RDW: 13.2 % (ref 11.7–15.4)
WBC: 4.8 10*3/uL (ref 3.4–10.8)

## 2021-12-26 LAB — COMPREHENSIVE METABOLIC PANEL
ALT: 13 IU/L (ref 0–32)
AST: 23 IU/L (ref 0–40)
Albumin/Globulin Ratio: 1.6 (ref 1.2–2.2)
Albumin: 4.4 g/dL (ref 3.8–4.8)
Alkaline Phosphatase: 154 IU/L — ABNORMAL HIGH (ref 44–121)
BUN/Creatinine Ratio: 20 (ref 12–28)
BUN: 20 mg/dL (ref 8–27)
Bilirubin Total: 0.3 mg/dL (ref 0.0–1.2)
CO2: 27 mmol/L (ref 20–29)
Calcium: 9.7 mg/dL (ref 8.7–10.3)
Chloride: 99 mmol/L (ref 96–106)
Creatinine, Ser: 1 mg/dL (ref 0.57–1.00)
Globulin, Total: 2.8 g/dL (ref 1.5–4.5)
Glucose: 87 mg/dL (ref 70–99)
Potassium: 4.4 mmol/L (ref 3.5–5.2)
Sodium: 144 mmol/L (ref 134–144)
Total Protein: 7.2 g/dL (ref 6.0–8.5)
eGFR: 60 mL/min/{1.73_m2} (ref >59)

## 2021-12-26 NOTE — Telephone Encounter (Signed)
Left message for patient to call back  

## 2021-12-26 NOTE — Telephone Encounter (Signed)
Patient called and said that the hospital wanted her to see Richardson Dopp by Friday. I told patient that she's already scheduled for 01/02/22 and she said that is too late

## 2021-12-28 NOTE — Telephone Encounter (Signed)
Returned call to patient.  Patient states she was advised at ED discharge to follow-up with Richardson Dopp, PA-C by 12/28/21 (today). An attempt to contact patient was made on 12/26/21 with message left for patient to callback. She returned call this morning, no appts available with Richardson Dopp today. Offered next available on 12/31/21 at 2:45pm, patient accepted and appt rescheduled for this date. Lab appt also rescheduled  for this date.  Patient verbalized understanding and expressed appreciation for call.

## 2021-12-28 NOTE — Telephone Encounter (Signed)
Patient returned RN's call. 

## 2021-12-31 ENCOUNTER — Encounter: Payer: Self-pay | Admitting: Physician Assistant

## 2021-12-31 ENCOUNTER — Ambulatory Visit: Payer: Medicare PPO | Attending: Physician Assistant | Admitting: Physician Assistant

## 2021-12-31 ENCOUNTER — Ambulatory Visit: Payer: Medicare PPO

## 2021-12-31 VITALS — BP 117/62 | HR 88 | Ht 64.0 in | Wt 160.6 lb

## 2021-12-31 DIAGNOSIS — E782 Mixed hyperlipidemia: Secondary | ICD-10-CM

## 2021-12-31 DIAGNOSIS — I2510 Atherosclerotic heart disease of native coronary artery without angina pectoris: Secondary | ICD-10-CM

## 2021-12-31 DIAGNOSIS — I5032 Chronic diastolic (congestive) heart failure: Secondary | ICD-10-CM | POA: Diagnosis not present

## 2021-12-31 DIAGNOSIS — L03116 Cellulitis of left lower limb: Secondary | ICD-10-CM | POA: Diagnosis not present

## 2021-12-31 LAB — CULTURE, BLOOD (ROUTINE X 2)
Culture: NO GROWTH
Culture: NO GROWTH
Special Requests: ADEQUATE
Special Requests: ADEQUATE

## 2021-12-31 MED ORDER — TORSEMIDE 20 MG PO TABS: 40.0000 mg | ORAL_TABLET | Freq: Two times a day (BID) | ORAL | 3 refills | Status: DC

## 2021-12-31 MED ORDER — TORSEMIDE 20 MG PO TABS: 40.0000 mg | ORAL_TABLET | Freq: Every day | ORAL | 3 refills | Status: DC

## 2021-12-31 MED ORDER — POTASSIUM CHLORIDE CRYS ER 20 MEQ PO TBCR: 20.0000 meq | EXTENDED_RELEASE_TABLET | Freq: Three times a day (TID) | ORAL | 3 refills | Status: DC

## 2021-12-31 NOTE — Assessment & Plan Note (Signed)
She has 1 day left on cephalexin and 4 days on Doxycycline. I have asked her to finish these out. Her leg looks much better compared to last week. I reminded her that her leg will continue to look red and swollen for the next couple of weeks. It should gradually improve. She should follow up with primary care or [place] if she has worsening symptoms. If she has recurrent symptoms, I would refer her to ID for evaluation and management.

## 2021-12-31 NOTE — Assessment & Plan Note (Addendum)
Volume is improved. She can resume Torsemide 40 mg once daily and K+ 20 mEq three times a day.  Obtain follow-up BMET today.  We discussed GDMT for (HFpEF) heart failure with preserved ejection fraction. I would like her to recover from her cellulitis and f/u in 4 weeks. I will try to initiate SGLT2 inhibitor vs Spironolactone at that time.

## 2021-12-31 NOTE — Assessment & Plan Note (Signed)
LDL 71 in June 2023.  Continue Crestor 40 mg daily.  If LDL continues to remain above 70, consider adding ezetimibe.

## 2021-12-31 NOTE — Progress Notes (Signed)
Cardiology Office Note:    Date:  12/31/2021   ID:  Meghan Schwartz, DOB [birth year], MRN [id number]  PCP:  Aviva Kluver  [facility] Providers Cardiologist:  Dietrich Pates, MD    Referring MD: No ref. provider found   Chief Complaint:  F/u for CHF, L leg cellulitis     Patient Profile: Coronary artery disease  S/p NSTEMI 01/2018 >> 3.5 x 28 mm DES to prox RCA Residual on Cath: LAD ostial and proximal 20, D1 ostial 20, LCx ostial 40 Heart failure with preserved ejection fraction  Hx of Leg edema, shortness of breath, BNP mildy elevated; tx with low dose Lasix (twice weekly) Also has L knee lat meniscus tear and Bakers cyst (may contribute some to edema) Echo 1/21: EF 60-65, normal RVSF, trivial MR, AV sclerosis without stenosis Echo 08/2020: EF 60-65, no RWMA, mild asymmetric LVH, GR 1 DD, normal RVSF, normal PASP, RAP 3 Rheumatoid Arthritis  Hyperlipidemia  L knee para meniscal cyst (Dr. Cleophas Dunker) Aortic atherosclerosis ABIs 12/2018: Normal bilaterally Hepatic steatosis (CT in 7/22) Left leg cellulitis October 2023   Cardiac Studies & Procedures   CARDIAC CATHETERIZATION  CARDIAC CATHETERIZATION 01/27/2018  Narrative  Prox RCA to Mid RCA lesion is 99% stenosed.  Ost LAD to Prox LAD lesion is 20% stenosed.  Prox LAD lesion is 20% stenosed.  Ost Cx to Prox Cx lesion is 40% stenosed.  Ost 1st Diag lesion is 20% stenosed.  A drug-eluting stent was successfully placed using a STENT SYNERGY DES 3.5X28.  Post intervention, there is a 0% residual stenosis.  1. Severe, ulcerated stenosis mid RCA 2. Mild non-obstructive disease in the LAD and Circumflex 3. Successful PTCA/DES x mid RCA  Recommendations: DAPT with ASA and Brilinta for one year. Continue statin. Will start a beta blocker. Smoking cessation. Likely d/c home in the am if stable.  Findings Coronary Findings Diagnostic  Dominance: Right  Left Anterior Descending Ost LAD to Prox LAD lesion is 20%  stenosed. Prox LAD lesion is 20% stenosed.  First Diagonal Branch Vessel is moderate in size. Ost 1st Diag lesion is 20% stenosed.  Left Circumflex Ost Cx to Prox Cx lesion is 40% stenosed.  Right Coronary Artery Vessel is large. Prox RCA to Mid RCA lesion is 99% stenosed.  Intervention  Prox RCA to Mid RCA lesion Stent Pre-stent angioplasty was performed using a BALLOON SAPPHIRE 2.5X20. A drug-eluting stent was successfully placed using a STENT SYNERGY DES 3.5X28. Stent strut is well apposed. Post-stent angioplasty was performed using a BALLOON SAPPHIRE Morgan [id number]. Post-Intervention Lesion Assessment The intervention was successful. Pre-interventional TIMI flow is 3. Post-intervention TIMI flow is 3. No complications occurred at this lesion. There is a 0% residual stenosis post intervention.     ECHOCARDIOGRAM  ECHOCARDIOGRAM COMPLETE 09/21/2020  Narrative ECHOCARDIOGRAM REPORT    Patient Name:   Meghan Schwartz Date of Exam: 09/21/2020 Medical Rec #:  [id number]    Height:       64.0 in Accession #:    [id number]   Weight:       173.8 lb Date of Birth:  1951/02/17    BSA:          1.843 m Patient Age:    70 years     BP:           122/68 mmHg Patient Gender: F            HR:           72  bpm. Exam Location:  Outpatient  Procedure: 2D Echo  Indications:    CAD Native Vessel I25.10  History:        Patient has prior history of Echocardiogram examinations, most recent 03/06/2019. Previous Myocardial Infarction and CAD; Risk Factors:Hypertension.  Sonographer:    Thurman Coyer RDCS (AE) Referring Phys: [id number] PAULA V ROSS  IMPRESSIONS   1. Left ventricular ejection fraction, by estimation, is 60 to 65%. The left ventricle has normal function. The left ventricle has no regional wall motion abnormalities. There is mild asymmetric left ventricular hypertrophy of the basal-septal segment. Left ventricular diastolic parameters are consistent with Grade I diastolic  dysfunction (impaired relaxation). 2. Right ventricular systolic function is normal. The right ventricular size is normal. There is normal pulmonary artery systolic pressure. 3. The mitral valve is normal in structure. No evidence of mitral valve regurgitation. No evidence of mitral stenosis. 4. The aortic valve is tricuspid. Aortic valve regurgitation is not visualized. No aortic stenosis is present. 5. The inferior vena cava is normal in size with greater than 50% respiratory variability, suggesting right atrial pressure of 3 mmHg.  FINDINGS Left Ventricle: Left ventricular ejection fraction, by estimation, is 60 to 65%. The left ventricle has normal function. The left ventricle has no regional wall motion abnormalities. The left ventricular internal cavity size was normal in size. There is mild asymmetric left ventricular hypertrophy of the basal-septal segment. Left ventricular diastolic parameters are consistent with Grade I diastolic dysfunction (impaired relaxation). Indeterminate filling pressures.  Right Ventricle: The right ventricular size is normal. No increase in right ventricular wall thickness. Right ventricular systolic function is normal. There is normal pulmonary artery systolic pressure. The tricuspid regurgitant velocity is 1.67 m/s, and with an assumed right atrial pressure of 3 mmHg, the estimated right ventricular systolic pressure is 14.2 mmHg.  Left Atrium: Left atrial size was normal in size.  Right Atrium: Right atrial size was normal in size.  Pericardium: There is no evidence of pericardial effusion.  Mitral Valve: The mitral valve is normal in structure. No evidence of mitral valve regurgitation. No evidence of mitral valve stenosis.  Tricuspid Valve: The tricuspid valve is normal in structure. Tricuspid valve regurgitation is trivial. No evidence of tricuspid stenosis.  Aortic Valve: The aortic valve is tricuspid. Aortic valve regurgitation is not visualized. No  aortic stenosis is present.  Pulmonic Valve: The pulmonic valve was normal in structure. Pulmonic valve regurgitation is not visualized. No evidence of pulmonic stenosis.  Aorta: The aortic root is normal in size and structure.  Venous: The inferior vena cava is normal in size with greater than 50% respiratory variability, suggesting right atrial pressure of 3 mmHg.  IAS/Shunts: No atrial level shunt detected by color flow Doppler.   LEFT VENTRICLE PLAX 2D LVIDd:         4.00 cm     Diastology LVIDs:         2.70 cm     LV e' medial:    6.64 cm/s LV PW:         0.90 cm     LV E/e' medial:  11.9 LV IVS:        1.10 cm     LV e' lateral:   8.59 cm/s LVOT diam:     2.10 cm     LV E/e' lateral: 9.2 LV SV:         62 LV SV Index:   33 LVOT Area:     3.46 cm  LV Volumes (MOD) LV vol d, MOD A2C: 44.1 ml LV vol d, MOD A4C: 57.5 ml LV vol s, MOD A2C: 17.9 ml LV vol s, MOD A4C: 18.9 ml LV SV MOD A2C:     26.2 ml LV SV MOD A4C:     57.5 ml LV SV MOD BP:      33.1 ml  RIGHT VENTRICLE RV S prime:     10.80 cm/s TAPSE (M-mode): 2.1 cm  LEFT ATRIUM             Index       RIGHT ATRIUM           Index LA diam:        2.80 cm 1.52 cm/m  RA Area:     18.70 cm LA Vol (A2C):   31.7 ml 17.20 ml/m RA Volume:   50.40 ml  27.35 ml/m LA Vol (A4C):   27.1 ml 14.70 ml/m LA Biplane Vol: 30.5 ml 16.55 ml/m AORTIC VALVE LVOT Vmax:   83.30 cm/s LVOT Vmean:  55.700 cm/s LVOT VTI:    0.178 m  AORTA Ao Root diam: 3.10 cm  MITRAL VALVE                TRICUSPID VALVE MV Area (PHT): 3.53 cm     TR Peak grad:   11.2 mmHg MV Decel Time: 215 msec     TR Vmax:        167.00 cm/s MV E velocity: 79.10 cm/s MV A velocity: 102.00 cm/s  SHUNTS MV E/A ratio:  0.78         Systemic VTI:  0.18 m Systemic Diam: 2.10 cm  Chilton Si MD Electronically signed by Chilton Si MD Signature Date/Time: 09/21/2020/4:19:22 PM    Final              History of Present Illness:   TYSHEKA FANGUY is  a 71 y.o. female with the above problem list.  She was seen last week with L leg swelling and redness along with volume overload. I was concerned she had a DVT in her L leg. She was sent to the ED. [place] was neg for DVT. I had placed her on Keflex to manage cellulitis. I also increased her Torsemide to 40 mg in A and 20 mg in P. The ED provider gave her a dose of Cefazolin and added Doxycycline as well. Blood cultures were neg x 2. WBC and lactate were normal.   She returns for f/u.  She is here alone.  Her leg does feel somewhat better.  It is less red.  She has not had chest pain, significant shortness of breath, orthopnea, syncope.  She does not have any right leg edema.     Past Medical History:  Diagnosis Date   Allergic rhinitis    Anxiety    Benzodiazepine dependence (HCC)    Bruxism    CAD (coronary artery disease)    S/p NSTEMI 01/2018 >> LHC: oLAD 20, pLAD 20; oD1 20, oLCx 40, pRCA 99 >> PCI: DES to prox RCA // Echo 01/2018: EF 50-55; prob inf-lat and inf HK   Chronic diastolic heart failure    Echocardiogram 07/2018: EF 55-60, grade 1 diastolic dysfunction, normal wall motion, normal GLS (-22.3), PASP 28   Chronic foot pain    bunions, torn ligaments-on chronic pain medication   CTS (carpal tunnel syndrome) 06/08/2014   Depression    High cholesterol    Hypertension    MDD (  major depressive disorder) 10/02/2017   Migraine    Myocardial infarction (HCC) 02/01/2018   Pneumonia 1986   left lung   RA (rheumatoid arthritis) (HCC)    Current Medications: Current Meds  Medication Sig   acetaminophen (TYLENOL) 500 MG tablet Take 500 mg by mouth every 6 (six) hours as needed (For knee pain).   cephALEXin (KEFLEX) 500 MG capsule Take 1 capsule (500 mg total) by mouth 4 (four) times daily.   Cetirizine HCl (ZYRTEC PO) Take 1 capsule by mouth daily.   cholecalciferol (VITAMIN D3) 25 MCG (1000 UT) tablet Take 1,000 Units by mouth daily.   citalopram (CELEXA) 10 MG tablet TK 1 T PO TID    clonazePAM (KLONOPIN) 1 MG tablet Take 0.5-1 mg by mouth See admin instructions. 0.5 mg midday, 1 mg at bedtime   cloNIDine (CATAPRES) 0.1 MG tablet Take 0.1 mg by mouth daily.   clopidogrel (PLAVIX) 75 MG tablet TAKE 1 TABLET BY MOUTH EVERY DAY   doxycycline (VIBRAMYCIN) 100 MG capsule Take 1 capsule (100 mg total) by mouth 2 (two) times daily.   escitalopram (LEXAPRO) 10 MG tablet Take 10 mg by mouth daily.    estradiol (VIVELLE-DOT) 0.075 MG/24HR Place 1 patch onto the skin 2 (two) times a week. Tuesday and Friday   etanercept (ENBREL SURECLICK) 50 MG/ML injection Inject 50 mg into the skin once a week.   hydrOXYzine (ATARAX) 25 MG tablet Take 25-50 mg by mouth 2 (two) times daily as needed.   meloxicam (MOBIC) 15 MG tablet TK 1 T PO QD   metoprolol tartrate (LOPRESSOR) 25 MG tablet TAKE 1 TABLET BY MOUTH EVERY DAY   mirabegron ER (MYRBETRIQ) 25 MG TB24 tablet Take 25 mg by mouth daily.   Multiple Vitamins-Minerals (MULTIVITAMIN WITH MINERALS) tablet Take 1 tablet by mouth daily.   nitroGLYCERIN (NITROSTAT) 0.4 MG SL tablet Place 1 tablet (0.4 mg total) under the tongue every 5 (five) minutes as needed for chest pain.   ondansetron (ZOFRAN-ODT) 4 MG disintegrating tablet Take 4 mg by mouth as needed.   potassium chloride SA (KLOR-CON M) 20 MEQ tablet Take 1 tablet (20 mEq total) by mouth 3 (three) times daily.   PRESCRIPTION MEDICATION Apply 1 application onto the skin and legs for scar (UREA Cream)   rosuvastatin (CRESTOR) 40 MG tablet Take 1 tablet (40 mg total) by mouth daily.   torsemide (DEMADEX) 20 MG tablet Take 2 tablets (40 mg total) by mouth 2 (two) times daily.   triamcinolone cream (KENALOG) 0.1 % Apply topically 2 (two) times daily.   ZUBSOLV 5.7-1.4 MG SUBL Place 1 tablet under the tongue 3 (three) times daily.   [DISCONTINUED] potassium chloride SA (KLOR-CON M20) 20 MEQ tablet TAKE 2 TABLETS (40 MEQ TOTAL) BY MOUTH 3 (THREE) TIMES DAILY.   [DISCONTINUED] torsemide (DEMADEX) 20  MG tablet TAKE 2 TABLETS BY MOUTH IN THE AM, TAKE 1 TABLET BY MOUTH IN THE PM    Allergies:   Erythromycin   Social History   Tobacco Use   Smoking status: Every Day    Packs/day: 0.50    Years: 20.00    Total pack years: 10.00    Types: Cigarettes   Smokeless tobacco: Never   Tobacco comments:    Restarted smoking earlier this year. smokes during stressful time 2 cigs daily  Vaping Use   Vaping Use: Never used  Substance Use Topics   Alcohol use: No    Alcohol/week: 0.0 standard drinks of alcohol   Drug  use: No    Family Hx: The patient's family history includes Arthritis in her maternal grandmother; Breast cancer in her mother; Heart attack in her maternal grandmother and paternal grandfather; Parkinson's disease in her father; Raynaud syndrome in her maternal aunt; Stroke in her paternal grandmother; Thyroid disease in her mother.  Review of Systems  Constitutional: Negative for chills and fever.     EKGs/Labs/Other Test Reviewed:    EKG:  EKG is not ordered today.    Recent Labs: 06/13/2021: NT-Pro BNP 139 12/25/2021: ALT 13; B Natriuretic Peptide 134.2; BUN 20; Creatinine, Ser 1.06; Hemoglobin 12.0; Platelets 187; Potassium 3.9; Sodium 142   Recent Lipid Panel No results for input(s): "CHOL", "TRIG", "HDL", "VLDL", "LDLCALC", "LDLDIRECT" in the last 8760 hours.   Risk Assessment/Calculations/Metrics:              Physical Exam:    VS:  BP 117/62   Pulse 88   Ht 5\' 4"  (1.626 m)   Wt 160 lb 9.6 oz (72.8 kg)   SpO2 94%   BMI 27.57 kg/m     Wt Readings from Last 3 Encounters:  12/31/21 160 lb 9.6 oz (72.8 kg)  12/25/21 167 lb (75.8 kg)  12/25/21 167 lb (75.8 kg)    Constitutional:      Appearance: Healthy appearance. Not in distress.  Neck:     Vascular: No JVR. JVD normal.  Pulmonary:     Effort: Pulmonary effort is normal.     Breath sounds: No wheezing. No rales.  Cardiovascular:     Normal rate. Regular rhythm. Normal S1. Normal S2.       Murmurs: There is no murmur.  Edema:    Peripheral edema (+erythema - improved from last visit) present.    Pretibial: 1+ edema of the left pretibial area. Abdominal:     Palpations: Abdomen is soft.  Skin:    General: Skin is warm and dry.  Neurological:     Mental Status: Alert and oriented to person, place and time.          ASSESSMENT & PLAN:   Lower extremity cellulitis She has 1 day left on cephalexin and 4 days on Doxycycline. I have asked her to finish these out. Her leg looks much better compared to last week. I reminded her that her leg will continue to look red and swollen for the next couple of weeks. It should gradually improve. She should follow up with primary care or 12/27/21 if she has worsening symptoms. If she has recurrent symptoms, I would refer her to ID for evaluation and management.   (HFpEF) heart failure with preserved ejection fraction (HCC) Volume is improved. She can resume Torsemide 40 mg once daily and K+ 20 mEq three times a day.  Obtain follow-up BMET today.  We discussed GDMT for (HFpEF) heart failure with preserved ejection fraction. I would like her to recover from her cellulitis and f/u in 4 weeks. I will try to initiate SGLT2 inhibitor vs Spironolactone at that time.   Coronary artery disease involving native coronary artery of native heart without angina pectoris Hx of NSTEMI in 2019 tx with DES to RCA. She is not having chest pain to suggest angina.  Continue Plavix 75 mg daily, Crestor 40 mg daily, metoprolol tartrate 25 mg daily.  Hyperlipidemia LDL 71 in June 2023.  Continue Crestor 40 mg daily.  If LDL continues to remain above 70, consider adding ezetimibe.  Dispo:  Return in about 4 weeks (around 01/28/2022) for Routine Follow Up, w/ Richardson Dopp, PA-C.   Medication Adjustments/Labs and Tests Ordered: Current medicines are reviewed at length with the patient today.  Concerns regarding medicines are outlined above.  Tests Ordered: No  orders of the defined types were placed in this encounter.  Medication Changes: Meds ordered this encounter  Medications   torsemide (DEMADEX) 20 MG tablet    Sig: Take 2 tablets (40 mg total) by mouth 2 (two) times daily.    Dispense:  180 tablet    Refill:  3   potassium chloride SA (KLOR-CON M) 20 MEQ tablet    Sig: Take 1 tablet (20 mEq total) by mouth 3 (three) times daily.    Dispense:  90 tablet    Refill:  3   Signed, Richardson Dopp, PA-C  12/31/2021 4:28 PM    [address], [place], [place]  [address] Phone: [phone]; Fax: [phone]

## 2021-12-31 NOTE — Assessment & Plan Note (Signed)
Hx of NSTEMI in 2019 tx with DES to RCA. She is not having chest pain to suggest angina.  Continue Plavix 75 mg daily, Crestor 40 mg daily, metoprolol tartrate 25 mg daily.

## 2021-12-31 NOTE — Patient Instructions (Addendum)
Medication Instructions:  Your physician has recommended you make the following change in your medication:   REDUCE the Torsemide to 20 mg taking 2 tablets daily  REDUCE the Potassium to 20 meq taking 1 tablet three times a day   *If you need a refill on your cardiac medications before your next appointment, please call your pharmacy*   Lab Work: None ordered today, but will get the BMET that was ordered last week.  If you have labs (blood work) drawn today and your tests are completely normal, you will receive your results only by: [place] (if you have MyChart) OR A paper copy in the mail If you have any lab test that is abnormal or we need to change your treatment, we will call you to review the results.   Testing/Procedures: None ordered   Follow-Up: At [facility], you and your health needs are our priority.  As part of our continuing mission to provide you with exceptional heart care, we have created designated Provider Care Teams.  These Care Teams include your primary Cardiologist (physician) and Advanced Practice Providers (APPs -  Physician Assistants and Nurse Practitioners) who all work together to provide you with the care you need, when you need it.  We recommend signing up for the patient portal called "MyChart".  Sign up information is provided on this After Visit Summary.  MyChart is used to connect with patients for Virtual Visits (Telemedicine).  Patients are able to view lab/test results, encounter notes, upcoming appointments, etc.  Non-urgent messages can be sent to your provider as well.   To learn more about what you can do with MyChart, go to [email or web address].    Your next appointment:   4 week(s)  01/28/22 ARRIVE AT 2:00   The format for your next appointment:   In Person  Provider:   Richardson Dopp, PA-C         Other Instructions Check into Primary Care at [facility]   The medications we will talk about next visit is  Lovie Macadamia or Spironolactone   Important Information About Sugar

## 2022-01-01 LAB — BASIC METABOLIC PANEL
BUN/Creatinine Ratio: 29 — ABNORMAL HIGH (ref 12–28)
BUN: 33 mg/dL — ABNORMAL HIGH (ref 8–27)
CO2: 30 mmol/L — ABNORMAL HIGH (ref 20–29)
Calcium: 9.5 mg/dL (ref 8.7–10.3)
Chloride: 99 mmol/L (ref 96–106)
Creatinine, Ser: 1.12 mg/dL — ABNORMAL HIGH (ref 0.57–1.00)
Glucose: 91 mg/dL (ref 70–99)
Potassium: 4.8 mmol/L (ref 3.5–5.2)
Sodium: 142 mmol/L (ref 134–144)
eGFR: 52 mL/min/{1.73_m2} — ABNORMAL LOW (ref >59)

## 2022-01-02 ENCOUNTER — Ambulatory Visit: Payer: Medicare PPO | Attending: Internal Medicine | Admitting: Internal Medicine

## 2022-01-02 ENCOUNTER — Ambulatory Visit: Payer: Medicare PPO | Admitting: Physician Assistant

## 2022-01-02 ENCOUNTER — Encounter: Payer: Self-pay | Admitting: Internal Medicine

## 2022-01-02 ENCOUNTER — Other Ambulatory Visit: Payer: Medicare PPO

## 2022-01-02 VITALS — BP 125/78 | HR 83 | Resp 16 | Ht 64.0 in | Wt 160.0 lb

## 2022-01-02 DIAGNOSIS — M21611 Bunion of right foot: Secondary | ICD-10-CM | POA: Diagnosis not present

## 2022-01-02 DIAGNOSIS — Z79899 Other long term (current) drug therapy: Secondary | ICD-10-CM | POA: Diagnosis not present

## 2022-01-02 DIAGNOSIS — M205X2 Other deformities of toe(s) (acquired), left foot: Secondary | ICD-10-CM

## 2022-01-02 DIAGNOSIS — M069 Rheumatoid arthritis, unspecified: Secondary | ICD-10-CM | POA: Diagnosis not present

## 2022-01-02 DIAGNOSIS — M1712 Unilateral primary osteoarthritis, left knee: Secondary | ICD-10-CM | POA: Diagnosis not present

## 2022-01-02 MED ORDER — TRIAMCINOLONE ACETONIDE 40 MG/ML IJ SUSP
40.0000 mg | INTRAMUSCULAR | Status: AC | PRN
  Administered 2022-01-02: 40 mg via INTRA_ARTICULAR

## 2022-01-02 MED ORDER — LIDOCAINE HCL 1 % IJ SOLN
3.0000 mL | INTRAMUSCULAR | Status: AC | PRN
  Administered 2022-01-02: 3 mL

## 2022-01-03 DIAGNOSIS — F332 Major depressive disorder, recurrent severe without psychotic features: Secondary | ICD-10-CM | POA: Diagnosis not present

## 2022-01-03 DIAGNOSIS — F1121 Opioid dependence, in remission: Secondary | ICD-10-CM | POA: Diagnosis not present

## 2022-01-03 DIAGNOSIS — F411 Generalized anxiety disorder: Secondary | ICD-10-CM | POA: Diagnosis not present

## 2022-01-03 DIAGNOSIS — F1320 Sedative, hypnotic or anxiolytic dependence, uncomplicated: Secondary | ICD-10-CM | POA: Diagnosis not present

## 2022-01-09 ENCOUNTER — Ambulatory Visit: Payer: Medicare PPO | Admitting: Internal Medicine

## 2022-01-10 ENCOUNTER — Telehealth: Payer: Self-pay

## 2022-01-10 DIAGNOSIS — H43813 Vitreous degeneration, bilateral: Secondary | ICD-10-CM | POA: Diagnosis not present

## 2022-01-10 DIAGNOSIS — H353212 Exudative age-related macular degeneration, right eye, with inactive choroidal neovascularization: Secondary | ICD-10-CM | POA: Diagnosis not present

## 2022-01-10 DIAGNOSIS — H43393 Other vitreous opacities, bilateral: Secondary | ICD-10-CM | POA: Diagnosis not present

## 2022-01-10 DIAGNOSIS — H353221 Exudative age-related macular degeneration, left eye, with active choroidal neovascularization: Secondary | ICD-10-CM | POA: Diagnosis not present

## 2022-01-10 NOTE — Telephone Encounter (Signed)
        Patient  visited [facility] on 12/25/2021  for Cellulitis of unspecified part of limb.   Telephone encounter attempt :  1st  A HIPAA compliant voice message was left requesting a return call.  Instructed patient to call back at [phone].   Zaiyah Sottile [facility]  [facility] Population Health Community Resource Care Guide   [email or web address]  ?? [phone]   Website: [email or web address]

## 2022-01-11 ENCOUNTER — Telehealth: Payer: Self-pay

## 2022-01-11 NOTE — Telephone Encounter (Signed)
        Patient  visited [facility] on 12/25/2021  for Cellulitis of unspecified part of limb.   Telephone encounter attempt :  2nd  A HIPAA compliant voice message was left requesting a return call.  Instructed patient to call back at [phone].   Keandria Berrocal [facility]  [facility] Population Health Community Resource Care Guide   [email or web address]  ?? [phone]   Website: [email or web address]  [email or web address]

## 2022-01-14 ENCOUNTER — Telehealth: Payer: Self-pay

## 2022-01-14 NOTE — Telephone Encounter (Signed)
     Patient  visit on 12/25/2021  at [facility] was for Cellulitis of unspecified part of limb.  Have you been able to follow up with your primary care physician? Yes  The patient was or was not able to obtain any needed medicine or equipment. Patient was able to obtain medications.  Are there diet recommendations that you are having difficulty following? No  Patient expresses understanding of discharge instructions and education provided has no other needs at this time.    Toree Edling [facility]  [facility] Population Health Community Resource Care Guide   [email or web address]  ?? [phone]   Website: [email or web address]  [email or web address]

## 2022-01-28 ENCOUNTER — Ambulatory Visit: Payer: Medicare PPO | Admitting: Physician Assistant

## 2022-01-28 NOTE — Progress Notes (Deleted)
Cardiology Office Note:    Date:  01/28/2022   ID:  Meghan Schwartz, DOB [birth year], MRN [id number]  PCP:  Aviva Kluver  [facility] Providers Cardiologist:  Dietrich Pates, MD { Click to update primary MD,subspecialty MD or APP then REFRESH:1}  *** Referring MD: No ref. provider found   Chief Complaint:  No chief complaint on file. {Click here for Visit Info    :1}   Patient Profile: Coronary artery disease  S/p NSTEMI 01/2018 >> 3.5 x 28 mm DES to prox RCA Residual on Cath: LAD ostial and proximal 20, D1 ostial 20, LCx ostial 40 Heart failure with preserved ejection fraction  Hx of Leg edema, shortness of breath, BNP mildy elevated; tx with low dose Lasix (twice weekly) Also has L knee lat meniscus tear and Bakers cyst (may contribute some to edema) Echo 1/21: EF 60-65, normal RVSF, trivial MR, AV sclerosis without stenosis Echo 08/2020: EF 60-65, no RWMA, mild asymmetric LVH, GR 1 DD, normal RVSF, normal PASP, RAP 3 Rheumatoid Arthritis  Hyperlipidemia  L knee para meniscal cyst (Dr. Cleophas Dunker) Aortic atherosclerosis ABIs 12/2018: Normal bilaterally Hepatic steatosis (CT in 7/22) Left leg cellulitis October 2023  Cardiac Studies & Procedures   CARDIAC CATHETERIZATION  CARDIAC CATHETERIZATION 01/27/2018  Narrative  Prox RCA to Mid RCA lesion is 99% stenosed.  Ost LAD to Prox LAD lesion is 20% stenosed.  Prox LAD lesion is 20% stenosed.  Ost Cx to Prox Cx lesion is 40% stenosed.  Ost 1st Diag lesion is 20% stenosed.  A drug-eluting stent was successfully placed using a STENT SYNERGY DES 3.5X28.  Post intervention, there is a 0% residual stenosis.  1. Severe, ulcerated stenosis mid RCA 2. Mild non-obstructive disease in the LAD and Circumflex 3. Successful PTCA/DES x mid RCA  Recommendations: DAPT with ASA and Brilinta for one year. Continue statin. Will start a beta blocker. Smoking cessation. Likely d/c home in the am if stable.  Findings Coronary  Findings Diagnostic  Dominance: Right  Left Anterior Descending Ost LAD to Prox LAD lesion is 20% stenosed. Prox LAD lesion is 20% stenosed.  First Diagonal Branch Vessel is moderate in size. Ost 1st Diag lesion is 20% stenosed.  Left Circumflex Ost Cx to Prox Cx lesion is 40% stenosed.  Right Coronary Artery Vessel is large. Prox RCA to Mid RCA lesion is 99% stenosed.  Intervention  Prox RCA to Mid RCA lesion Stent Pre-stent angioplasty was performed using a BALLOON SAPPHIRE 2.5X20. A drug-eluting stent was successfully placed using a STENT SYNERGY DES 3.5X28. Stent strut is well apposed. Post-stent angioplasty was performed using a BALLOON SAPPHIRE [place] [id number]. Post-Intervention Lesion Assessment The intervention was successful. Pre-interventional TIMI flow is 3. Post-intervention TIMI flow is 3. No complications occurred at this lesion. There is a 0% residual stenosis post intervention.     ECHOCARDIOGRAM  ECHOCARDIOGRAM COMPLETE 09/21/2020  Narrative ECHOCARDIOGRAM REPORT    Patient Name:   Meghan Schwartz Date of Exam: 09/21/2020 Medical Rec #:  [id number]    Height:       64.0 in Accession #:    [id number]   Weight:       173.8 lb Date of Birth:  1950-10-20    BSA:          1.843 m Patient Age:    70 years     BP:           122/68 mmHg Patient Gender: F  HR:           72 bpm. Exam Location:  Outpatient  Procedure: 2D Echo  Indications:    CAD Native Vessel I25.10  History:        Patient has prior history of Echocardiogram examinations, most recent 03/06/2019. Previous Myocardial Infarction and CAD; Risk Factors:Hypertension.  Sonographer:    Thurman Coyer RDCS (AE) Referring Phys: [id number] PAULA V ROSS  IMPRESSIONS   1. Left ventricular ejection fraction, by estimation, is 60 to 65%. The left ventricle has normal function. The left ventricle has no regional wall motion abnormalities. There is mild asymmetric left ventricular hypertrophy of the  basal-septal segment. Left ventricular diastolic parameters are consistent with Grade I diastolic dysfunction (impaired relaxation). 2. Right ventricular systolic function is normal. The right ventricular size is normal. There is normal pulmonary artery systolic pressure. 3. The mitral valve is normal in structure. No evidence of mitral valve regurgitation. No evidence of mitral stenosis. 4. The aortic valve is tricuspid. Aortic valve regurgitation is not visualized. No aortic stenosis is present. 5. The inferior vena cava is normal in size with greater than 50% respiratory variability, suggesting right atrial pressure of 3 mmHg.  FINDINGS Left Ventricle: Left ventricular ejection fraction, by estimation, is 60 to 65%. The left ventricle has normal function. The left ventricle has no regional wall motion abnormalities. The left ventricular internal cavity size was normal in size. There is mild asymmetric left ventricular hypertrophy of the basal-septal segment. Left ventricular diastolic parameters are consistent with Grade I diastolic dysfunction (impaired relaxation). Indeterminate filling pressures.  Right Ventricle: The right ventricular size is normal. No increase in right ventricular wall thickness. Right ventricular systolic function is normal. There is normal pulmonary artery systolic pressure. The tricuspid regurgitant velocity is 1.67 m/s, and with an assumed right atrial pressure of 3 mmHg, the estimated right ventricular systolic pressure is 14.2 mmHg.  Left Atrium: Left atrial size was normal in size.  Right Atrium: Right atrial size was normal in size.  Pericardium: There is no evidence of pericardial effusion.  Mitral Valve: The mitral valve is normal in structure. No evidence of mitral valve regurgitation. No evidence of mitral valve stenosis.  Tricuspid Valve: The tricuspid valve is normal in structure. Tricuspid valve regurgitation is trivial. No evidence of tricuspid  stenosis.  Aortic Valve: The aortic valve is tricuspid. Aortic valve regurgitation is not visualized. No aortic stenosis is present.  Pulmonic Valve: The pulmonic valve was normal in structure. Pulmonic valve regurgitation is not visualized. No evidence of pulmonic stenosis.  Aorta: The aortic root is normal in size and structure.  Venous: The inferior vena cava is normal in size with greater than 50% respiratory variability, suggesting right atrial pressure of 3 mmHg.  IAS/Shunts: No atrial level shunt detected by color flow Doppler.   LEFT VENTRICLE PLAX 2D LVIDd:         4.00 cm     Diastology LVIDs:         2.70 cm     LV e' medial:    6.64 cm/s LV PW:         0.90 cm     LV E/e' medial:  11.9 LV IVS:        1.10 cm     LV e' lateral:   8.59 cm/s LVOT diam:     2.10 cm     LV E/e' lateral: 9.2 LV SV:         62 LV SV Index:  33 LVOT Area:     3.46 cm  LV Volumes (MOD) LV vol d, MOD A2C: 44.1 ml LV vol d, MOD A4C: 57.5 ml LV vol s, MOD A2C: 17.9 ml LV vol s, MOD A4C: 18.9 ml LV SV MOD A2C:     26.2 ml LV SV MOD A4C:     57.5 ml LV SV MOD BP:      33.1 ml  RIGHT VENTRICLE RV S prime:     10.80 cm/s TAPSE (M-mode): 2.1 cm  LEFT ATRIUM             Index       RIGHT ATRIUM           Index LA diam:        2.80 cm 1.52 cm/m  RA Area:     18.70 cm LA Vol (A2C):   31.7 ml 17.20 ml/m RA Volume:   50.40 ml  27.35 ml/m LA Vol (A4C):   27.1 ml 14.70 ml/m LA Biplane Vol: 30.5 ml 16.55 ml/m AORTIC VALVE LVOT Vmax:   83.30 cm/s LVOT Vmean:  55.700 cm/s LVOT VTI:    0.178 m  AORTA Ao Root diam: 3.10 cm  MITRAL VALVE                TRICUSPID VALVE MV Area (PHT): 3.53 cm     TR Peak grad:   11.2 mmHg MV Decel Time: 215 msec     TR Vmax:        167.00 cm/s MV E velocity: 79.10 cm/s MV A velocity: 102.00 cm/s  SHUNTS MV E/A ratio:  0.78         Systemic VTI:  0.18 m Systemic Diam: 2.10 cm  Chilton Si MD Electronically signed by Chilton Si MD Signature  Date/Time: 09/21/2020/4:19:22 PM    Final              History of Present Illness:   Meghan Schwartz is a 71 y.o. female with the above problem list.  She was last seen 12/31/21. She was still recovering from lower ext cellulitis. I deferred titration of GDMT for HFpEF until f/u. She returns for f/u. ***      EKG:  {Desc; done/not:10129}    Reviewed and updated this encounter:***      ROS   Labs/Other Test Reviewed:    Recent Labs: 06/13/2021: NT-Pro BNP 139 12/25/2021: ALT 13; B Natriuretic Peptide 134.2; Hemoglobin 12.0; Platelets 187 12/31/2021: BUN 33; Creatinine, Ser 1.12; Potassium 4.8; Sodium 142   Recent Lipid Panel No results for input(s): "CHOL", "TRIG", "HDL", "VLDL", "LDLCALC", "LDLDIRECT" in the last 8760 hours.    Risk Assessment/Calculations/Metrics:   {Does this patient have ATRIAL FIBRILLATION?:[phone]}     No BP recorded.  {Refresh Note OR Click here to enter BP  :1}***    Physical Exam:    VS:  There were no vitals taken for this visit.    Wt Readings from Last 3 Encounters:  01/02/22 160 lb (72.6 kg)  12/31/21 160 lb 9.6 oz (72.8 kg)  12/25/21 167 lb (75.8 kg)    Physical Exam ***     ASSESSMENT & PLAN:   No problem-specific Assessment & Plan notes found for this encounter.  *** Lower extremity cellulitis She has 1 day left on cephalexin and 4 days on Doxycycline. I have asked her to finish these out. Her leg looks much better compared to last week. I reminded her that her leg will continue to look  red and swollen for the next couple of weeks. It should gradually improve. She should follow up with primary care or [place] if she has worsening symptoms. If she has recurrent symptoms, I would refer her to ID for evaluation and management.    (HFpEF) heart failure with preserved ejection fraction (HCC) Volume is improved. She can resume Torsemide 40 mg once daily and K+ 20 mEq three times a day.  Obtain follow-up BMET today.  We discussed GDMT for (HFpEF)  heart failure with preserved ejection fraction. I would like her to recover from her cellulitis and f/u in 4 weeks. I will try to initiate SGLT2 inhibitor vs Spironolactone at that time.    Coronary artery disease involving native coronary artery of native heart without angina pectoris Hx of NSTEMI in 2019 tx with DES to RCA. She is not having chest pain to suggest angina.  Continue Plavix 75 mg daily, Crestor 40 mg daily, metoprolol tartrate 25 mg daily.   Hyperlipidemia LDL 71 in June 2023.  Continue Crestor 40 mg daily.  If LDL continues to remain above 70, consider adding ezetimibe. ***      {Are you ordering a CV Procedure (e.g. stress test, cath, DCCV, TEE, etc)?   Press F2        :[id number]}   Dispo:  No follow-ups on file.   Medication Adjustments/Labs and Tests Ordered: Current medicines are reviewed at length with the patient today.  Concerns regarding medicines are outlined above.  Tests Ordered: No orders of the defined types were placed in this encounter.  Medication Changes: No orders of the defined types were placed in this encounter.  Signed, Tereso Newcomer, PA-C  01/28/2022 12:07 PM    [facility] [address] [place], [place], [place]  [address] Phone: [phone]; Fax: [phone]

## 2022-01-30 ENCOUNTER — Telehealth: Payer: Self-pay | Admitting: Pharmacist

## 2022-01-30 NOTE — Telephone Encounter (Signed)
Patient dropped off her portion of [employer] PAP renewal application for [employer]. Pending provider signature. Placed in Dr. Gregary Cromer folder for signature.  Case ID: [id number]  Submitted a Prior Authorization renewal request to [facility] for ENBREL via CoverMyMeds. Will update once we receive a response.  Key: [id number]  Chesley Mires, PharmD, MPH, BCPS, CPP Clinical Pharmacist (Rheumatology and Pulmonology)

## 2022-02-01 NOTE — Telephone Encounter (Signed)
Submitted Patient Assistance RENEWAL Application to [employer] for ENBREL along with provider portion, patient portion, med list, insurance card copy, PA. Will update patient when we receive a response.  Fax# [phone] Phone# [phone]  Norely Schlick, PharmD, MPH, BCPS, CPP Clinical Pharmacist (Rheumatology and Pulmonology)  

## 2022-02-05 ENCOUNTER — Ambulatory Visit
Admission: RE | Admit: 2022-02-05 | Discharge: 2022-02-05 | Disposition: A | Discharge status: Home / Self Care | Payer: Medicare PPO | Source: Ambulatory Visit | Attending: Family Medicine | Admitting: Family Medicine

## 2022-02-05 ENCOUNTER — Inpatient Hospital Stay: Admission: RE | Admit: 2022-02-05 | Payer: Medicare PPO | Source: Ambulatory Visit

## 2022-02-05 DIAGNOSIS — Z1231 Encounter for screening mammogram for malignant neoplasm of breast: Secondary | ICD-10-CM | POA: Diagnosis not present

## 2022-02-07 NOTE — Telephone Encounter (Signed)
Received a fax from  [employer] regarding an approval for ENBREL patient assistance from 02/07/2022 to 02/25/2023. Approval letter sent to scan center.  Phone number: [phone]  Chesley Mires, PharmD, MPH, BCPS, CPP Clinical Pharmacist (Rheumatology and Pulmonology)

## 2022-02-21 ENCOUNTER — Other Ambulatory Visit: Payer: Self-pay | Admitting: Internal Medicine

## 2022-02-21 MED ORDER — POTASSIUM CHLORIDE CRYS ER 20 MEQ PO TBCR: 20.0000 meq | EXTENDED_RELEASE_TABLET | Freq: Three times a day (TID) | ORAL | 3 refills | Status: DC

## 2022-02-22 ENCOUNTER — Telehealth: Payer: Self-pay | Admitting: *Deleted

## 2022-02-22 NOTE — Telephone Encounter (Signed)
Left knee pain and swelling, warm to the touch, swollen, difficulty walking, patient went to [facility] due to an accident x 1 1/2 weeks, patient requesting fluid drawn off knee, patient advised to go to [facility] or Urgent Care, if needed. Patient advised to call office 02/26/2022 if she doesn't get any relief.

## 2022-02-27 ENCOUNTER — Telehealth: Payer: Self-pay | Admitting: Internal Medicine

## 2022-02-27 NOTE — Progress Notes (Unsigned)
Office Visit Note  Patient: Meghan Schwartz             Date of Birth: 1950-10-15           MRN: [id number]             PCP: Pcp, No Referring: No ref. provider found Visit Date: 02/28/2022   Subjective:  No chief complaint on file.   History of Present Illness: Meghan Schwartz is a 72 y.o. female here for follow up ***   Previous HPI    No Rheumatology ROS completed.   PMFS History:  Patient Active Problem List   Diagnosis Date Noted   Leg edema, left 12/25/2021   Claw toe, left 08/07/2021   Dog bite 03/05/2021   High risk medication use 02/05/2021   Dry mouth 02/05/2021   Pain in left foot 01/31/2021   Bunion of great toe of right foot 06/15/2020   Hypoxia 03/05/2019   Derangement of posterior horn of medial meniscus 02/02/2019   Derangement of posterior horn of lateral meniscus 02/02/2019   Meniscal cyst, unspecified laterality 12/01/2018   Torn medial meniscus 12/01/2018   Primary osteoarthritis of left knee 12/01/2018   Coronary artery disease involving native coronary artery of native heart without angina pectoris 09/14/2018   (HFpEF) heart failure with preserved ejection fraction (HCC)    Chronic pain of left knee 05/15/2018   Mass of left knee 05/15/2018   Non-ST elevation (NSTEMI) myocardial infarction [facility])    Chest pain 01/25/2018   RA (rheumatoid arthritis) ([place]) 01/25/2018   Hyperlipidemia 01/25/2018   Hypokalemia 01/25/2018   Depression with anxiety 01/25/2018   Tobacco abuse 01/25/2018   Lower extremity cellulitis 01/25/2018   Benzodiazepine dependence ([place])    MDD (major depressive disorder), recurrent severe, without psychosis ([place]) 10/02/2017   CTS (carpal tunnel syndrome) 06/08/2014    Past Medical History:  Diagnosis Date   Allergic rhinitis    Anxiety    Benzodiazepine dependence (HCC)    Bruxism    CAD (coronary artery disease)    S/p NSTEMI 01/2018 >> LHC: oLAD 59, pLAD 70; oD1 20, oLCx 40, pRCA 99 >> PCI: DES to prox RCA // Echo  01/2018: EF 50-55; prob inf-lat and inf HK   Chronic diastolic heart failure    Echocardiogram 07/2018: EF 03-50, grade 1 diastolic dysfunction, normal wall motion, normal GLS (-22.3), PASP 28   Chronic foot pain    bunions, torn ligaments-on chronic pain medication   CTS (carpal tunnel syndrome) 06/08/2014   Depression    High cholesterol    Hypertension    MDD (major depressive disorder) 10/02/2017   Migraine    Myocardial infarction ([place]) 02/01/2018   Pneumonia 1986   left lung   RA (rheumatoid arthritis) ([place])     Family History  Problem Relation Age of Onset   Thyroid disease Mother    Breast cancer Mother    Parkinson's disease Father    Raynaud syndrome Maternal Aunt    Arthritis Maternal Grandmother    Heart attack Maternal Grandmother    Stroke Paternal Grandmother    Heart attack Paternal Grandfather    Past Surgical History:  Procedure Laterality Date   BTL  2000   CARPAL TUNNEL RELEASE Left    CESAREAN [place] N/A 01/27/2018   Procedure: CORONARY STENT INTERVENTION;  Surgeon: Burnell Blanks, MD;  Location: [place] [facility];  Service: Cardiovascular;  Laterality: N/A;  eye implants     FOOT SURGERY Left 2008   front teeth removed after injury     KNEE ARTHROSCOPY WITH LATERAL MENISECTOMY Left 02/02/2019   Procedure: LEFT KNEE ARTHROSCOPY, MEDIAL AND LATERAL MENISECTOMY, EXCISION OF LEFT LATERAL MENISCAL CYST;  Surgeon: Garald Balding, MD;  Location: [facility];  Service: Orthopedics;  Laterality: Left;   LEFT HEART CATH AND CORONARY ANGIOGRAPHY N/A 01/27/2018   Procedure: LEFT HEART CATH AND CORONARY ANGIOGRAPHY;  Surgeon: Burnell Blanks, MD;  Location: [place];  Service: Cardiovascular;  Laterality: N/A;   pre cancerous lesion removed from forehead     TOTAL ABDOMINAL HYSTERECTOMY W/ BILATERAL SALPINGOOPHORECTOMY  2002   Dr. Willis Modena   VAGINAL HYSTERECTOMY  2001   Social History   Social  History Narrative   Lives at home with spouse.    Right handed.   Caffeine use: 2 cups coffee/day    8 oz soda/day    Immunization History  Administered Date(s) Administered   PFIZER(Purple Top)SARS-COV-2 Vaccination 04/10/2019, 05/05/2019, 10/19/2019, 06/20/2020   Pfizer Covid-19 Vaccine Bivalent Booster 39yrs & up 11/29/2020   Tdap 04/03/2018     Objective: Vital Signs: There were no vitals taken for this visit.   Physical Exam   Musculoskeletal Exam: ***  CDAI Exam: CDAI Score: -- Patient Global: --; Provider Global: -- Swollen: --; Tender: -- Joint Exam 02/28/2022   No joint exam has been documented for this visit   There is currently no information documented on the homunculus. Go to the Rheumatology activity and complete the homunculus joint exam.  Investigation: No additional findings.  Imaging: MM 3D SCREEN BREAST BILATERAL  Result Date: 02/06/2022 CLINICAL DATA:  Screening. EXAM: DIGITAL SCREENING BILATERAL MAMMOGRAM WITH TOMOSYNTHESIS AND CAD TECHNIQUE: Bilateral screening digital craniocaudal and mediolateral oblique mammograms were obtained. Bilateral screening digital breast tomosynthesis was performed. The images were evaluated with computer-aided detection. COMPARISON:  Previous exam(s). ACR Breast Density Category b: There are scattered areas of fibroglandular density. FINDINGS: There are no findings suspicious for malignancy. IMPRESSION: No mammographic evidence of malignancy. A result letter of this screening mammogram will be mailed directly to the patient. RECOMMENDATION: Screening mammogram in one year. (Code:SM-B-01Y) BI-RADS CATEGORY  1: Negative. Electronically Signed   By: Marin Olp M.D.   On: 02/06/2022 13:24    Recent Labs: Lab Results  Component Value Date   WBC 4.8 12/25/2021   HGB 12.0 12/25/2021   PLT 187 12/25/2021   NA 142 12/31/2021   K 4.8 12/31/2021   CL 99 12/31/2021   CO2 30 (H) 12/31/2021   GLUCOSE 91 12/31/2021   BUN 33 (H)  12/31/2021   CREATININE 1.12 (H) 12/31/2021   BILITOT 0.5 12/25/2021   ALKPHOS 113 12/25/2021   AST 20 12/25/2021   ALT 13 12/25/2021   PROT 7.6 12/25/2021   ALBUMIN 4.2 12/25/2021   CALCIUM 9.5 12/31/2021   GFRAA 65 02/04/2020   QFTBGOLDPLUS NEGATIVE 07/10/2021    Speciality Comments: No specialty comments available.  Procedures:  No procedures performed Allergies: Erythromycin   Assessment / Plan:     Visit Diagnoses: No diagnosis found.  ***  Orders: No orders of the defined types were placed in this encounter.  No orders of the defined types were placed in this encounter.    Follow-Up Instructions: No follow-ups on file.   Collier Salina, MD  Note - This record has been created using [employer].  Chart creation errors have been sought, but may not always  have been located. Such creation errors do not reflect on  the standard of medical care.

## 2022-02-27 NOTE — Telephone Encounter (Signed)
Patient called stating she is still having a lot of pain in her left knee, as well as left arm.  Patient states she was told that someone from the office would be calling to schedule an "urgent" appointment.  Please advise.

## 2022-02-28 ENCOUNTER — Ambulatory Visit: Payer: Medicare PPO | Attending: Internal Medicine | Admitting: Internal Medicine

## 2022-02-28 ENCOUNTER — Encounter: Payer: Self-pay | Admitting: Internal Medicine

## 2022-02-28 VITALS — BP 107/68 | HR 73 | Resp 16 | Ht 64.0 in | Wt 165.0 lb

## 2022-02-28 DIAGNOSIS — M069 Rheumatoid arthritis, unspecified: Secondary | ICD-10-CM

## 2022-02-28 DIAGNOSIS — M25562 Pain in left knee: Secondary | ICD-10-CM | POA: Diagnosis not present

## 2022-02-28 DIAGNOSIS — L03116 Cellulitis of left lower limb: Secondary | ICD-10-CM | POA: Diagnosis not present

## 2022-02-28 DIAGNOSIS — G8929 Other chronic pain: Secondary | ICD-10-CM | POA: Diagnosis not present

## 2022-02-28 DIAGNOSIS — Z79899 Other long term (current) drug therapy: Secondary | ICD-10-CM

## 2022-02-28 MED ORDER — LIDOCAINE HCL 1 % IJ SOLN
3.0000 mL | INTRAMUSCULAR | Status: AC | PRN
  Administered 2022-02-28: 3 mL

## 2022-02-28 MED ORDER — TRIAMCINOLONE ACETONIDE 40 MG/ML IJ SUSP
40.0000 mg | INTRAMUSCULAR | Status: AC | PRN
  Administered 2022-02-28: 40 mg via INTRA_ARTICULAR

## 2022-03-01 LAB — COMPLETE METABOLIC PANEL WITH GFR
AG Ratio: 1.4 (calc) (ref 1.0–2.5)
ALT: 11 U/L (ref 6–29)
AST: 18 U/L (ref 10–35)
Albumin: 3.9 g/dL (ref 3.6–5.1)
Alkaline phosphatase (APISO): 115 U/L (ref 37–153)
BUN: 18 mg/dL (ref 7–25)
CO2: 37 mmol/L — ABNORMAL HIGH (ref 20–32)
Calcium: 9.4 mg/dL (ref 8.6–10.4)
Chloride: 99 mmol/L (ref 98–110)
Creat: 0.88 mg/dL (ref 0.60–1.00)
Globulin: 2.8 g/dL (calc) (ref 1.9–3.7)
Glucose, Bld: 105 mg/dL — ABNORMAL HIGH (ref 65–99)
Potassium: 3.9 mmol/L (ref 3.5–5.3)
Sodium: 142 mmol/L (ref 135–146)
Total Bilirubin: 0.4 mg/dL (ref 0.2–1.2)
Total Protein: 6.7 g/dL (ref 6.1–8.1)
eGFR: 70 mL/min/{1.73_m2} (ref >=60)

## 2022-03-01 LAB — CBC WITH DIFFERENTIAL/PLATELET
Absolute Monocytes: 378 cells/uL (ref 200–950)
Basophils Absolute: 21 cells/uL (ref 0–200)
Basophils Relative: 0.7 %
Eosinophils Absolute: 258 cells/uL (ref 15–500)
Eosinophils Relative: 8.6 %
HCT: 33.9 % — ABNORMAL LOW (ref 35.0–45.0)
Hemoglobin: 11.1 g/dL — ABNORMAL LOW (ref 11.7–15.5)
Lymphs Abs: 1044 cells/uL (ref 850–3900)
MCH: 26.7 pg — ABNORMAL LOW (ref 27.0–33.0)
MCHC: 32.7 g/dL (ref 32.0–36.0)
MCV: 81.7 fL (ref 80.0–100.0)
MPV: 9 fL (ref 7.5–12.5)
Monocytes Relative: 12.6 %
Neutro Abs: 1299 cells/uL — ABNORMAL LOW (ref 1500–7800)
Neutrophils Relative %: 43.3 %
Platelets: 179 10*3/uL (ref 140–400)
RBC: 4.15 10*6/uL (ref 3.80–5.10)
RDW: 13.5 % (ref 11.0–15.0)
Total Lymphocyte: 34.8 %
WBC: 3 10*3/uL — ABNORMAL LOW (ref 3.8–10.8)

## 2022-03-01 LAB — C-REACTIVE PROTEIN: CRP: 17.2 mg/L — ABNORMAL HIGH (ref <8.0)

## 2022-03-05 DIAGNOSIS — I5032 Chronic diastolic (congestive) heart failure: Secondary | ICD-10-CM | POA: Diagnosis not present

## 2022-03-08 ENCOUNTER — Ambulatory Visit: Payer: Medicare PPO

## 2022-03-18 NOTE — Progress Notes (Signed)
CRP is increased up to 17.2 which was previously normal. This does suggest some active inflammation that might be a cause for the quickly increased swelling. White blood cell count is slightly decreased at 3.0. I don't think any change to treatment needed at this time and we can recheck this in next planned follow up.

## 2022-03-21 DIAGNOSIS — F1320 Sedative, hypnotic or anxiolytic dependence, uncomplicated: Secondary | ICD-10-CM | POA: Diagnosis not present

## 2022-03-21 DIAGNOSIS — F332 Major depressive disorder, recurrent severe without psychotic features: Secondary | ICD-10-CM | POA: Diagnosis not present

## 2022-03-21 DIAGNOSIS — F411 Generalized anxiety disorder: Secondary | ICD-10-CM | POA: Diagnosis not present

## 2022-03-21 DIAGNOSIS — F1121 Opioid dependence, in remission: Secondary | ICD-10-CM | POA: Diagnosis not present

## 2022-03-26 DIAGNOSIS — F1320 Sedative, hypnotic or anxiolytic dependence, uncomplicated: Secondary | ICD-10-CM | POA: Diagnosis not present

## 2022-03-26 DIAGNOSIS — F1121 Opioid dependence, in remission: Secondary | ICD-10-CM | POA: Diagnosis not present

## 2022-03-26 DIAGNOSIS — F411 Generalized anxiety disorder: Secondary | ICD-10-CM | POA: Diagnosis not present

## 2022-03-26 DIAGNOSIS — F332 Major depressive disorder, recurrent severe without psychotic features: Secondary | ICD-10-CM | POA: Diagnosis not present

## 2022-04-05 DIAGNOSIS — I5032 Chronic diastolic (congestive) heart failure: Secondary | ICD-10-CM | POA: Diagnosis not present

## 2022-04-08 ENCOUNTER — Ambulatory Visit: Payer: Medicare PPO | Admitting: Internal Medicine

## 2022-04-09 DIAGNOSIS — R600 Localized edema: Secondary | ICD-10-CM | POA: Diagnosis not present

## 2022-04-09 DIAGNOSIS — M069 Rheumatoid arthritis, unspecified: Secondary | ICD-10-CM | POA: Diagnosis not present

## 2022-04-09 DIAGNOSIS — M21611 Bunion of right foot: Secondary | ICD-10-CM | POA: Diagnosis not present

## 2022-04-09 DIAGNOSIS — M7122 Synovial cyst of popliteal space [Baker], left knee: Secondary | ICD-10-CM | POA: Diagnosis not present

## 2022-04-09 DIAGNOSIS — I5032 Chronic diastolic (congestive) heart failure: Secondary | ICD-10-CM | POA: Diagnosis not present

## 2022-04-09 DIAGNOSIS — L03119 Cellulitis of unspecified part of limb: Secondary | ICD-10-CM | POA: Diagnosis not present

## 2022-04-09 DIAGNOSIS — E559 Vitamin D deficiency, unspecified: Secondary | ICD-10-CM | POA: Diagnosis not present

## 2022-04-09 DIAGNOSIS — R031 Nonspecific low blood-pressure reading: Secondary | ICD-10-CM | POA: Diagnosis not present

## 2022-04-11 NOTE — Progress Notes (Signed)
Cardiology Office Note   Date:  04/12/2022   ID:  Meghan Schwartz, DOB 1951/01/14, MRN [id number]  PCP:  Pcp, No  Cardiologist:   Meghan Carnes, MD   F/U of CAD     History of Present Illness:  Meghan Schwartz is a 72 y.o. female with a hx of CAD   She is s/p NSTEMI in 01/2018   Underwent DES to RCA (99 to 20% LAD  40% LCx   Also has hx of HFpEF, RA and HL  Uses O2 at night  July 2022  Echocardiogram showed LVEF normal   Mild diastolic dysfunction.   She called in in early Aug feeling very bad, weak.   Metalozone stopped  Uses O2 at night   Breathing is stable unless gets nervous or rushes   I saw the pt in clinic in April 2023   She has been seen by Meghan Schwartz since  She was just seen in [place] [facility] on 04/09/22 for increased LE LE swelling     Worse over the past several days    Blood work done   The pt denies CP  Breathing is stable    Legs swollen, hurt     Denies palpitations    Taking torsemide but says it is not working as good    Current Meds  Medication Sig   acetaminophen (TYLENOL) 500 MG tablet Take 500 mg by mouth every 6 (six) hours as needed (For knee pain).   Cetirizine HCl (ZYRTEC PO) Take 1 capsule by mouth daily.   cholecalciferol (VITAMIN D3) 25 MCG (1000 UT) tablet Take 1,000 Units by mouth daily.   clonazePAM (KLONOPIN) 1 MG tablet Take 0.5-1 mg by mouth See admin instructions. 0.5 mg midday, 1 mg at bedtime   clopidogrel (PLAVIX) 75 MG tablet TAKE 1 TABLET BY MOUTH EVERY DAY   escitalopram (LEXAPRO) 10 MG tablet 10 mg. Take 1 1/2 tablets by mouth daily   estradiol (VIVELLE-DOT) 0.075 MG/24HR Place 1 patch onto the skin 2 (two) times a week. Tuesday and Friday   etanercept (ENBREL SURECLICK) 50 MG/ML injection Inject 50 mg into the skin once a week.   hydrOXYzine (ATARAX) 25 MG tablet Take 25-50 mg by mouth 2 (two) times daily as needed.   metoprolol tartrate (LOPRESSOR) 25 MG tablet TAKE 1 TABLET BY MOUTH EVERY DAY   Multiple Vitamins-Minerals (MULTIVITAMIN  WITH MINERALS) tablet Take 1 tablet by mouth daily.   nitroGLYCERIN (NITROSTAT) 0.4 MG SL tablet Place 1 tablet (0.4 mg total) under the tongue every 5 (five) minutes as needed for chest pain.   ondansetron (ZOFRAN-ODT) 4 MG disintegrating tablet Take 4 mg by mouth as needed.   potassium chloride SA (KLOR-CON M) 20 MEQ tablet Take 1 tablet (20 mEq total) by mouth 3 (three) times daily.   PRESCRIPTION MEDICATION Apply 1 application onto the skin and legs for scar (UREA Cream)   rosuvastatin (CRESTOR) 40 MG tablet Take 1 tablet (40 mg total) by mouth daily.   sulfamethoxazole-trimethoprim (BACTRIM DS) 800-160 MG tablet Take 1 tablet by mouth 2 (two) times daily.   torsemide (DEMADEX) 20 MG tablet Take 2 tablets (40 mg total) by mouth daily.   triamcinolone cream (KENALOG) 0.1 % Apply topically 2 (two) times daily.   ZUBSOLV 5.7-1.4 MG SUBL Place 1 tablet under the tongue 3 (three) times daily.     Allergies:   Erythromycin   Past Medical History:  Diagnosis Date   Allergic rhinitis  Anxiety    Benzodiazepine dependence (HCC)    Bruxism    CAD (coronary artery disease)    S/p NSTEMI 01/2018 >> LHC: oLAD 20, pLAD 20; oD1 20, oLCx 40, pRCA 99 >> PCI: DES to prox RCA // Echo 01/2018: EF 50-55; prob inf-lat and inf HK   Chronic diastolic heart failure    Echocardiogram 07/2018: EF 123456, grade 1 diastolic dysfunction, normal wall motion, normal GLS (-22.3), PASP 28   Chronic foot pain    bunions, torn ligaments-on chronic pain medication   CTS (carpal tunnel syndrome) 06/08/2014   Depression    High cholesterol    Hypertension    MDD (major depressive disorder) 10/02/2017   Migraine    Myocardial infarction ([place]) 02/01/2018   Pneumonia 1986   left lung   RA (rheumatoid arthritis) ([place])     Past Surgical History:  Procedure Laterality Date   BTL  2000   CARPAL TUNNEL RELEASE Left    CESAREAN SECTION  1978. 1983   CORONARY STENT INTERVENTION N/A 01/27/2018   Procedure: CORONARY  STENT INTERVENTION;  Surgeon: Burnell Blanks, MD;  Location: [facility];  Service: Cardiovascular;  Laterality: N/A;   eye implants     FOOT SURGERY Left 2008   front teeth removed after injury     KNEE ARTHROSCOPY WITH LATERAL MENISECTOMY Left 02/02/2019   Procedure: LEFT KNEE ARTHROSCOPY, MEDIAL AND LATERAL MENISECTOMY, EXCISION OF LEFT LATERAL MENISCAL CYST;  Surgeon: Garald Balding, MD;  Location: [facility];  Service: Orthopedics;  Laterality: Left;   LEFT HEART CATH AND CORONARY ANGIOGRAPHY N/A 01/27/2018   Procedure: LEFT HEART CATH AND CORONARY ANGIOGRAPHY;  Surgeon: Burnell Blanks, MD;  Location: [facility];  Service: Cardiovascular;  Laterality: N/A;   pre cancerous lesion removed from forehead     TOTAL ABDOMINAL HYSTERECTOMY W/ BILATERAL SALPINGOOPHORECTOMY  2002   Dr. Willis Modena   VAGINAL HYSTERECTOMY  2001     Social History:  The patient  reports that she has been smoking cigarettes. She started smoking about 52 years ago. She has a 5.00 pack-year smoking history. She has never been exposed to tobacco smoke. She has never used smokeless tobacco. She reports that she does not drink alcohol and does not use drugs.   Family History:  The patient's family history includes Arthritis in her maternal grandmother; Breast cancer in her mother; Heart attack in her maternal grandmother and paternal grandfather; Parkinson's disease in her father; Raynaud syndrome in her maternal aunt; Stroke in her paternal grandmother; Thyroid disease in her mother.    ROS:  Please see the history of present illness. All other systems are reviewed and  Negative to the above problem except as noted.    PHYSICAL EXAM: VS:  BP 110/62   Pulse 72   Ht 5' 4"$  (1.626 m)   Wt 168 lb (76.2 kg)   BMI 28.84 kg/m   GEN: Well nourished, well developed, in no acute distress  HEENT: normal  Neck: JVP is not elevated   No bruits Cardiac: RRR; no murmurs    1 +  LE  edema   Respiratory:  clear to auscultation bilaterally,  GI: soft, nontender, nondistended, + BS  No hepatomegaly  MS: no deformity Moving all extremities   Skin: warm and dry, no rash  Face is puffy esp around eyes    legs   Skin is red Ledell Peoples   Neuro: no gross defects  Psych: euthymic mood, full affect  EKG:  Not done today   Echo:  July 2022    1. Left ventricular ejection fraction, by estimation, is 60 to 65%. The  left ventricle has normal function. The left ventricle has no regional  wall motion abnormalities. There is mild asymmetric left ventricular  hypertrophy of the basal-septal segment.  Left ventricular diastolic parameters are consistent with Grade I  diastolic dysfunction (impaired relaxation).   2. Right ventricular systolic function is normal. The right ventricular  size is normal. There is normal pulmonary artery systolic pressure.   3. The mitral valve is normal in structure. No evidence of mitral valve  regurgitation. No evidence of mitral stenosis.   4. The aortic valve is tricuspid. Aortic valve regurgitation is not  visualized. No aortic stenosis is present.   5. The inferior vena cava is normal in size with greater than 50%  respiratory variability, suggesting right atrial pressure of 3 mmHg.    Lipid Panel    Component Value Date/Time   CHOL 171 02/04/2020 1652   TRIG 88 02/04/2020 1652   HDL 67 02/04/2020 1652   CHOLHDL 2.6 02/04/2020 1652   CHOLHDL 2.7 01/28/2018 0353   VLDL 16 01/28/2018 0353   LDLCALC 88 02/04/2020 1652      Wt Readings from Last 3 Encounters:  04/12/22 168 lb (76.2 kg)  02/28/22 165 lb (74.8 kg)  01/02/22 160 lb (72.6 kg)      ASSESSMENT AND PLAN:   1  Edema  Pt with LE edema that looks chronic   Also has periorbital edema  Rest of volume status appears OK    Will check labs  then review meds   Repeat echo    Get TSH, free T3, free T4, BNP, ESR   2 CAD  s/p DES to RCA in 2019   Follow   I am not convinced dyspnea is  angina   3  HL  Will get lipomed panel  4 Rheum Hx of RA  WIll check ESR, ANA and RF       F/U in based on test results   Current medicines are reviewed at length with the patient today.  The patient does not have concerns regarding medicines.  Signed, Meghan Carnes, MD  04/12/2022 12:36 PM    [address], [place], [place]  [address] Phone: [phone]; Fax: [phone]

## 2022-04-12 ENCOUNTER — Ambulatory Visit: Payer: Medicare PPO | Attending: Internal Medicine | Admitting: Internal Medicine

## 2022-04-12 ENCOUNTER — Encounter: Payer: Self-pay | Admitting: Internal Medicine

## 2022-04-12 VITALS — BP 110/62 | HR 72 | Ht 64.0 in | Wt 168.0 lb

## 2022-04-12 DIAGNOSIS — R600 Localized edema: Secondary | ICD-10-CM | POA: Diagnosis not present

## 2022-04-12 DIAGNOSIS — R609 Edema, unspecified: Secondary | ICD-10-CM

## 2022-04-12 NOTE — Patient Instructions (Addendum)
Medication Instructions:   *If you need a refill on your cardiac medications before your next appointment, please call your pharmacy*   Lab Work: TSH, FREE T4, FREE T3, PRO BNP, SED RATE, BMET, RF, ANA  If you have labs (blood work) drawn today and your tests are completely normal, you will receive your results only by: [place] (if you have MyChart) OR A paper copy in the mail If you have any lab test that is abnormal or we need to change your treatment, we will call you to review the results.   Testing/Procedures: Your physician has requested that you have an echocardiogram. Echocardiography is a painless test that uses sound waves to create images of your heart. It provides your doctor with information about the size and shape of your heart and how well your heart's chambers and valves are working. This procedure takes approximately one hour. There are no restrictions for this procedure. Please do NOT wear cologne, perfume, aftershave, or lotions (deodorant is allowed). Please arrive 15 minutes prior to your appointment time.    Follow-Up: At [facility], you and your health needs are our priority.  As part of our continuing mission to provide you with exceptional heart care, we have created designated Provider Care Teams.  These Care Teams include your primary Cardiologist (physician) and Advanced Practice Providers (APPs -  Physician Assistants and Nurse Practitioners) who all work together to provide you with the care you need, when you need it.  We recommend signing up for the patient portal called "MyChart".  Sign up information is provided on this After Visit Summary.  MyChart is used to connect with patients for Virtual Visits (Telemedicine).  Patients are able to view lab/test results, encounter notes, upcoming appointments, etc.  Non-urgent messages can be sent to your provider as well.   To learn more about what you can do with MyChart, go to  [email or web address].

## 2022-04-13 LAB — BASIC METABOLIC PANEL
BUN/Creatinine Ratio: 12 (ref 12–28)
BUN: 14 mg/dL (ref 8–27)
CO2: 28 mmol/L (ref 20–29)
Calcium: 9.4 mg/dL (ref 8.7–10.3)
Chloride: 99 mmol/L (ref 96–106)
Creatinine, Ser: 1.14 mg/dL — ABNORMAL HIGH (ref 0.57–1.00)
Glucose: 85 mg/dL (ref 70–99)
Potassium: 4.1 mmol/L (ref 3.5–5.2)
Sodium: 144 mmol/L (ref 134–144)
eGFR: 51 mL/min/{1.73_m2} — ABNORMAL LOW (ref >59)

## 2022-04-13 LAB — T3, FREE: T3, Free: 3.1 pg/mL (ref 2.0–4.4)

## 2022-04-13 LAB — PRO B NATRIURETIC PEPTIDE: NT-Pro BNP: 802 pg/mL — ABNORMAL HIGH (ref 0–301)

## 2022-04-13 LAB — ANA: Anti Nuclear Antibody (ANA): NEGATIVE

## 2022-04-13 LAB — SEDIMENTATION RATE: Sed Rate: 27 mm/hr (ref 0–40)

## 2022-04-13 LAB — RHEUMATOID FACTOR: Rheumatoid fact SerPl-aCnc: <10 IU/mL (ref <14.0)

## 2022-04-13 LAB — TSH: TSH: 2.32 u[IU]/mL (ref 0.450–4.500)

## 2022-04-13 LAB — T4, FREE: Free T4: 0.98 ng/dL (ref 0.82–1.77)

## 2022-04-15 ENCOUNTER — Telehealth: Payer: Self-pay

## 2022-04-15 DIAGNOSIS — I5032 Chronic diastolic (congestive) heart failure: Secondary | ICD-10-CM

## 2022-04-15 DIAGNOSIS — I2510 Atherosclerotic heart disease of native coronary artery without angina pectoris: Secondary | ICD-10-CM

## 2022-04-15 DIAGNOSIS — R0602 Shortness of breath: Secondary | ICD-10-CM

## 2022-04-15 DIAGNOSIS — E876 Hypokalemia: Secondary | ICD-10-CM

## 2022-04-15 DIAGNOSIS — R609 Edema, unspecified: Secondary | ICD-10-CM

## 2022-04-15 DIAGNOSIS — Z79899 Other long term (current) drug therapy: Secondary | ICD-10-CM

## 2022-04-15 MED ORDER — METOLAZONE 2.5 MG PO TABS: ORAL_TABLET | ORAL | 3 refills | Status: DC

## 2022-04-15 NOTE — Telephone Encounter (Signed)
-----   Message from Dorris Carnes V, MD sent at 04/14/2022 10:01 PM EST ----- Thyroid function is normal  Inflammation markers are negative    Fluid is up    I would reocmm continuing Demedex   Add zaroxylyn 2.5 mg 30 min  prior to lasx   CHeck BMET and BNP in 1 week

## 2022-04-15 NOTE — Telephone Encounter (Signed)
The patient has been notified of the result and verbalized understanding.  All questions (if any) were answered. Labs 04/23/22.

## 2022-04-18 DIAGNOSIS — H353221 Exudative age-related macular degeneration, left eye, with active choroidal neovascularization: Secondary | ICD-10-CM | POA: Diagnosis not present

## 2022-04-18 DIAGNOSIS — H43813 Vitreous degeneration, bilateral: Secondary | ICD-10-CM | POA: Diagnosis not present

## 2022-04-18 DIAGNOSIS — H353212 Exudative age-related macular degeneration, right eye, with inactive choroidal neovascularization: Secondary | ICD-10-CM | POA: Diagnosis not present

## 2022-04-18 DIAGNOSIS — H43393 Other vitreous opacities, bilateral: Secondary | ICD-10-CM | POA: Diagnosis not present

## 2022-04-23 ENCOUNTER — Ambulatory Visit: Payer: Medicare PPO | Attending: Internal Medicine

## 2022-04-23 DIAGNOSIS — Z79899 Other long term (current) drug therapy: Secondary | ICD-10-CM | POA: Diagnosis not present

## 2022-04-23 DIAGNOSIS — I5032 Chronic diastolic (congestive) heart failure: Secondary | ICD-10-CM

## 2022-04-23 DIAGNOSIS — I2510 Atherosclerotic heart disease of native coronary artery without angina pectoris: Secondary | ICD-10-CM

## 2022-04-23 DIAGNOSIS — R0602 Shortness of breath: Secondary | ICD-10-CM | POA: Diagnosis not present

## 2022-04-23 DIAGNOSIS — R609 Edema, unspecified: Secondary | ICD-10-CM

## 2022-04-23 DIAGNOSIS — E876 Hypokalemia: Secondary | ICD-10-CM

## 2022-04-24 LAB — BASIC METABOLIC PANEL
BUN/Creatinine Ratio: 22 (ref 12–28)
BUN: 23 mg/dL (ref 8–27)
CO2: 34 mmol/L — ABNORMAL HIGH (ref 20–29)
Calcium: 10.2 mg/dL (ref 8.7–10.3)
Chloride: 92 mmol/L — ABNORMAL LOW (ref 96–106)
Creatinine, Ser: 1.06 mg/dL — ABNORMAL HIGH (ref 0.57–1.00)
Glucose: 111 mg/dL — ABNORMAL HIGH (ref 70–99)
Potassium: 3.7 mmol/L (ref 3.5–5.2)
Sodium: 142 mmol/L (ref 134–144)
eGFR: 56 mL/min/{1.73_m2} — ABNORMAL LOW (ref >59)

## 2022-04-24 LAB — PRO B NATRIURETIC PEPTIDE: NT-Pro BNP: 180 pg/mL (ref 0–301)

## 2022-04-25 ENCOUNTER — Telehealth: Payer: Self-pay | Admitting: Internal Medicine

## 2022-04-25 DIAGNOSIS — F1121 Opioid dependence, in remission: Secondary | ICD-10-CM | POA: Diagnosis not present

## 2022-04-25 DIAGNOSIS — F1320 Sedative, hypnotic or anxiolytic dependence, uncomplicated: Secondary | ICD-10-CM | POA: Diagnosis not present

## 2022-04-25 DIAGNOSIS — F411 Generalized anxiety disorder: Secondary | ICD-10-CM | POA: Diagnosis not present

## 2022-04-25 DIAGNOSIS — F332 Major depressive disorder, recurrent severe without psychotic features: Secondary | ICD-10-CM | POA: Diagnosis not present

## 2022-04-25 NOTE — Telephone Encounter (Signed)
The patient has been notified of the result and verbalized understanding.  All questions (if any) were answered.  Pt states her edema has improved and her legs look the best they ever have.   She states her weight is finally going down vs up.   Advised the pt to continue her current regimen and I will pass this information along to Dr. Harrington Challenger and her RN.   Pt verbalized understanding and agrees with this plan.

## 2022-04-25 NOTE — Telephone Encounter (Signed)
-----   Message from Fay Records, MD sent at 04/24/2022  3:11 PM EST ----- Labs are OK  Cr 1.06   Fluid number is better  How is edema in legs?

## 2022-04-25 NOTE — Telephone Encounter (Signed)
Patient was returning call for results. Please advise  

## 2022-05-01 DIAGNOSIS — F332 Major depressive disorder, recurrent severe without psychotic features: Secondary | ICD-10-CM | POA: Diagnosis not present

## 2022-05-01 DIAGNOSIS — F1121 Opioid dependence, in remission: Secondary | ICD-10-CM | POA: Diagnosis not present

## 2022-05-01 DIAGNOSIS — F411 Generalized anxiety disorder: Secondary | ICD-10-CM | POA: Diagnosis not present

## 2022-05-01 DIAGNOSIS — F1320 Sedative, hypnotic or anxiolytic dependence, uncomplicated: Secondary | ICD-10-CM | POA: Diagnosis not present

## 2022-05-04 DIAGNOSIS — I5032 Chronic diastolic (congestive) heart failure: Secondary | ICD-10-CM | POA: Diagnosis not present

## 2022-05-05 NOTE — Progress Notes (Unsigned)
Office Visit Note  Patient: Meghan Schwartz             Date of Birth: Aug 18, 1950           MRN: [id number]             PCP: Pcp, No Referring: No ref. provider found Visit Date: 05/06/2022   Subjective:  No chief complaint on file.   History of Present Illness: Meghan Schwartz is a 72 y.o. female here for follow up ***   Previous HPI 02/28/22 Meghan Schwartz is a 72 y.o. female here for follow up for RA on Enbrel 50 mg [place] weekly and for osteoarthritis with particular worsening in left knee swelling during the past 2 weeks. She did not recall any particular injury or event hurting the knee, but has been walking and on her feet somewhat more than usual during the holidays. She had a wound on her left shin in November after hitting this and some cellulitis in the area. This was evaluated at the ED with ultrasound negative for CVT and treated with keflex and doxycycline. Mostly resolved but still slightly more swelling on the left leg than her right. She also reports some soreness in her left upper arm and deltoid area, started after having 3 vaccines for her flu, RSV, and COVID doses this year.   Previous HPI 01/02/22 Meghan Schwartz is a 72 y.o. female here for follow up for RA on Enbrel 50 mg [place] weekly. She had been doing pretty well until about 2 weeks ago problems started after she dropped a yeti mug onto her left foot causing initially some pain and swelling in the big toe. She subsequently developed increasing erythema and swelling in the left calf this became substantial and noticed a 7 lb weight gain as well by 10/31. She saw her urology office initially treated with dose of IM rocephin and saw her cardiology office regarding this. She was directed to the ED US obtained was negative for DVT. She was prescribed a course of doxycycline and keflex and has bene taking these for 8 days with 2 days remaining. Pain and swelling is largely improved now at this point, except her left knee is still very  swollen. Both anteriorly and large baker's cyst posteriorly this was noted to be about 3 cm diameter and appeared intact at ED [place] study.   Previous HPI 10/09/2021  Meghan Schwartz is a 72 y.o. female here for follow up for RA on Enbrel 50 mg [place] weekly.  Overall most of her joint problems have been doing reasonably well but has some increased symptoms affecting her knees especially.  There is increased swelling on both sides currently having more trouble with the left knee reports crepitus and grinding sensations especially with rotational movement and when lying supine.  She has some bruising on the left forearm without specific provoking injury.  However not seeing any joint pain or swelling in those areas.   Previous HPI 08/07/2021 Meghan Schwartz is a 72 y.o. female here for follow up for RA on Enbrel 50 mg [place] weekly. She has made an effort to taking her Enbrel consistently every week. Her knee pain improved after the last steroid injection about 2 months ago which was her worst arthritis complaint. She continues having trouble with her feet and toes limiting wearing a variety of shoes and affecting her walking. Based on last follow up with Dr. Sharol Given she does not anticipate any additional surgery  or procedure intervention for this coming up.    Previous HPI 06/04/2021 Meghan Schwartz is a 72 y.o. female here for follow up for RA on Enbrel 50 mg [place] weekly. She reports varying adherence to treatment plan often missing Enbrel doses and estimates that she takes it about every 10-14 days. She saw Dr. Sharol Given for her painful left toe deformity on February but reports not remembering the plan very well. Foot pain and difficulty walking on this side bothers her daily. Left knee pain and swelling improved after steroid injection but is worse again for the past few weeks. Swelling in the back of the knee bothers her and limits her knee range of movement.   Previous HPI 03/05/21 Meghan Schwartz is a 72 y.o. female here for  follow up for RA on Enbrel 50 mg [place] weekly. She also appeared to have considerable osteoarthritis contribution to symptoms in hands and knees as well. Inflammatory markers with mildly elevated CRP of 11.9. She has been overall well except suffered a bite to her right hand from her dog currently on augmentin after ED visit with total of 14 days prescribed. Left knee pain is worse than usual posteriorly with swelling she reports previous baker's cyst has bothered her since she had knee surgery in the past.   Previous HPI 02/05/21 Meghan Schwartz is a 72 y.o. female her for rheumatoid arthritis on Enbrel 50 mg [place] weekly for which she has been seeing Dr. Trudie Reed previously. She was originally diagnosed about 10 or 11 years ago due to development of joint pain, stiffness, synovitis, and progressive joint deformities primarily starting in the feet and knees.  Subsequently proceeded to have increased joint pains involving bilateral hands with some swelling at the PIP and MCP joints.  She started treatment with Enbrel injections with some improvement in the lower extremity joint pains she recalls still having a lot of pain symptoms and pronounced fatigue that did not clear with this treatment.  Combination treatment was tried with methotrexate with significant GI intolerance.  She tried leflunomide but developed substantial alopecia so discontinued the medicine.  She switched to hydroxychloroquine combination treatment which was being well-tolerated but she has had multiple new or worsening medical problems over the past few years with concern of medication related effects. She had hand numbness in the past improved after left carpal tunnel release surgery but she has some residual muscle atrophy in thenar prominence. She had left knee meniscectomy in 2020. She suffered NSTEMI in 01/2018 with DES placement in RCA for 99% blockage and has chronic diastolic CHF. Her edema is generally controlled while taking torsemide but  does rapidly re accumulate fluid off this medication. Ophthalmology evaluation indicated significant visual field deficits findings consistent with age-related macular degeneration of both eyes and recommended avoidance of this medication.  She also has considerable osteoarthritis especially in her knees and feet previous surgical reconstruction on the left foot. Overall she feels her gait is not very stable due to these problems.   DMARD Hx Enbrel - current   HCQ - macular disease? LEF - alopecia MTX - GI intolerance.    No Rheumatology ROS completed.   PMFS History:  Patient Active Problem List   Diagnosis Date Noted   Leg edema, left 12/25/2021   Claw toe, left 08/07/2021   Dog bite 03/05/2021   High risk medication use 02/05/2021   Dry mouth 02/05/2021   Pain in left foot 01/31/2021   Bunion of great toe of right  foot 06/15/2020   Hypoxia 03/05/2019   Derangement of posterior horn of medial meniscus 02/02/2019   Derangement of posterior horn of lateral meniscus 02/02/2019   Meniscal cyst, unspecified laterality 12/01/2018   Torn medial meniscus 12/01/2018   Primary osteoarthritis of left knee 12/01/2018   Coronary artery disease involving native coronary artery of native heart without angina pectoris 09/14/2018   (HFpEF) heart failure with preserved ejection fraction (HCC)    Chronic pain of left knee 05/15/2018   Mass of left knee 05/15/2018   Non-ST elevation (NSTEMI) myocardial infarction [facility])    Chest pain 01/25/2018   RA (rheumatoid arthritis) ([place]) 01/25/2018   Hyperlipidemia 01/25/2018   Hypokalemia 01/25/2018   Depression with anxiety 01/25/2018   Tobacco abuse 01/25/2018   Lower extremity cellulitis 01/25/2018   Benzodiazepine dependence ([place])    MDD (major depressive disorder), recurrent severe, without psychosis ([place]) 10/02/2017   CTS (carpal tunnel syndrome) 06/08/2014    Past Medical History:  Diagnosis Date   Allergic rhinitis    Anxiety     Benzodiazepine dependence ([place])    Bruxism    CAD (coronary artery disease)    S/p NSTEMI 01/2018 >> LHC: oLAD 39, pLAD 65; oD1 20, oLCx 40, pRCA 99 >> PCI: DES to prox RCA // Echo 01/2018: EF 50-55; prob inf-lat and inf HK   Chronic diastolic heart failure    Echocardiogram 07/2018: EF 123456, grade 1 diastolic dysfunction, normal wall motion, normal GLS (-22.3), PASP 28   Chronic foot pain    bunions, torn ligaments-on chronic pain medication   CTS (carpal tunnel syndrome) 06/08/2014   Depression    High cholesterol    Hypertension    MDD (major depressive disorder) 10/02/2017   Migraine    Myocardial infarction ([place]) 02/01/2018   Pneumonia 1986   left lung   RA (rheumatoid arthritis) ([place])     Family History  Problem Relation Age of Onset   Thyroid disease Mother    Breast cancer Mother    Parkinson's disease Father    Raynaud syndrome Maternal Aunt    Arthritis Maternal Grandmother    Heart attack Maternal Grandmother    Stroke Paternal Grandmother    Heart attack Paternal Grandfather    Past Surgical History:  Procedure Laterality Date   BTL  2000   CARPAL TUNNEL RELEASE Left    CESAREAN [place] N/A 01/27/2018   Procedure: CORONARY STENT INTERVENTION;  Surgeon: Burnell Blanks, MD;  Location: [facility];  Service: Cardiovascular;  Laterality: N/A;   eye implants     FOOT SURGERY Left 2008   front teeth removed after injury     KNEE ARTHROSCOPY WITH LATERAL MENISECTOMY Left 02/02/2019   Procedure: LEFT KNEE ARTHROSCOPY, MEDIAL AND LATERAL MENISECTOMY, EXCISION OF LEFT LATERAL MENISCAL CYST;  Surgeon: Garald Balding, MD;  Location[facility];  Service: Orthopedics;  Laterality: Left;   LEFT HEART CATH AND CORONARY ANGIOGRAPHY N/A 01/27/2018   Procedure: LEFT HEART CATH AND CORONARY ANGIOGRAPHY;  Surgeon: Burnell Blanks, MD;  Location: [facility];  Service: Cardiovascular;  Laterality: N/A;   pre  cancerous lesion removed from forehead     TOTAL ABDOMINAL HYSTERECTOMY W/ BILATERAL SALPINGOOPHORECTOMY  2002   Dr. Willis Modena   VAGINAL HYSTERECTOMY  2001   Social History   Social History Narrative   Lives at home with spouse.    Right handed.   Caffeine use: 2 cups coffee/day  8 oz soda/day    Immunization History  Administered Date(s) Administered   PFIZER(Purple Top)SARS-COV-2 Vaccination 04/10/2019, 05/05/2019, 10/19/2019, 06/20/2020   Pfizer Covid-19 Vaccine Bivalent Booster 63yr & up 11/29/2020   Tdap 04/03/2018     Objective: Vital Signs: There were no vitals taken for this visit.   Physical Exam   Musculoskeletal Exam: ***  CDAI Exam: CDAI Score: -- Patient Global: --; Provider Global: -- Swollen: --; Tender: -- Joint Exam 05/06/2022   No joint exam has been documented for this visit   There is currently no information documented on the homunculus. Go to the Rheumatology activity and complete the homunculus joint exam.  Investigation: No additional findings.  Imaging: No results found.  Recent Labs: Lab Results  Component Value Date   WBC 3.0 (L) 02/28/2022   HGB 11.1 (L) 02/28/2022   PLT 179 02/28/2022   NA 142 04/23/2022   K 3.7 04/23/2022   CL 92 (L) 04/23/2022   CO2 34 (H) 04/23/2022   GLUCOSE 111 (H) 04/23/2022   BUN 23 04/23/2022   CREATININE 1.06 (H) 04/23/2022   BILITOT 0.4 02/28/2022   ALKPHOS 113 12/25/2021   AST 18 02/28/2022   ALT 11 02/28/2022   PROT 6.7 02/28/2022   ALBUMIN 4.2 12/25/2021   CALCIUM 10.2 04/23/2022   GFRAA 65 02/04/2020   QFTBGOLDPLUS NEGATIVE 07/10/2021    Speciality Comments: No specialty comments available.  Procedures:  No procedures performed Allergies: Erythromycin   Assessment / Plan:     Visit Diagnoses: No diagnosis found.  ***  Orders: No orders of the defined types were placed in this encounter.  No orders of the defined types were placed in this encounter.    Follow-Up  Instructions: No follow-ups on file.   CCollier Salina MD  Note - This record has been created using [employer]  Chart creation errors have been sought, but may not always  have been located. Such creation errors do not reflect on  the standard of medical care.

## 2022-05-06 ENCOUNTER — Ambulatory Visit: Payer: Medicare PPO | Attending: Internal Medicine | Admitting: Internal Medicine

## 2022-05-06 ENCOUNTER — Encounter: Payer: Self-pay | Admitting: Internal Medicine

## 2022-05-06 VITALS — BP 91/54 | HR 75 | Ht 64.0 in | Wt 158.0 lb

## 2022-05-06 DIAGNOSIS — M205X2 Other deformities of toe(s) (acquired), left foot: Secondary | ICD-10-CM

## 2022-05-06 DIAGNOSIS — M069 Rheumatoid arthritis, unspecified: Secondary | ICD-10-CM

## 2022-05-06 DIAGNOSIS — M21611 Bunion of right foot: Secondary | ICD-10-CM | POA: Diagnosis not present

## 2022-05-06 DIAGNOSIS — M1712 Unilateral primary osteoarthritis, left knee: Secondary | ICD-10-CM

## 2022-05-06 DIAGNOSIS — Z79899 Other long term (current) drug therapy: Secondary | ICD-10-CM | POA: Diagnosis not present

## 2022-05-06 MED ORDER — LIDOCAINE HCL 1 % IJ SOLN
3.0000 mL | INTRAMUSCULAR | Status: AC | PRN
  Administered 2022-05-06: 3 mL

## 2022-05-09 ENCOUNTER — Ambulatory Visit (HOSPITAL_COMMUNITY): Payer: Medicare PPO | Attending: Internal Medicine

## 2022-05-09 DIAGNOSIS — I2510 Atherosclerotic heart disease of native coronary artery without angina pectoris: Secondary | ICD-10-CM | POA: Diagnosis not present

## 2022-05-09 DIAGNOSIS — I10 Essential (primary) hypertension: Secondary | ICD-10-CM | POA: Diagnosis not present

## 2022-05-09 DIAGNOSIS — R609 Edema, unspecified: Secondary | ICD-10-CM | POA: Diagnosis not present

## 2022-05-09 DIAGNOSIS — I252 Old myocardial infarction: Secondary | ICD-10-CM | POA: Diagnosis not present

## 2022-05-09 DIAGNOSIS — E785 Hyperlipidemia, unspecified: Secondary | ICD-10-CM | POA: Diagnosis not present

## 2022-05-09 DIAGNOSIS — M069 Rheumatoid arthritis, unspecified: Secondary | ICD-10-CM | POA: Insufficient documentation

## 2022-05-09 LAB — ECHOCARDIOGRAM COMPLETE
Area-P 1/2: 2.76 cm2
S' Lateral: 2.9 cm

## 2022-05-10 ENCOUNTER — Ambulatory Visit: Payer: Medicare PPO | Admitting: Podiatry

## 2022-05-15 ENCOUNTER — Ambulatory Visit (INDEPENDENT_AMBULATORY_CARE_PROVIDER_SITE_OTHER): Payer: Medicare PPO

## 2022-05-15 ENCOUNTER — Ambulatory Visit: Payer: Medicare PPO | Admitting: Podiatry

## 2022-05-15 ENCOUNTER — Encounter: Payer: Self-pay | Admitting: Podiatry

## 2022-05-15 DIAGNOSIS — M21611 Bunion of right foot: Secondary | ICD-10-CM | POA: Diagnosis not present

## 2022-05-15 DIAGNOSIS — M21612 Bunion of left foot: Secondary | ICD-10-CM

## 2022-05-15 DIAGNOSIS — M21619 Bunion of unspecified foot: Secondary | ICD-10-CM

## 2022-05-15 DIAGNOSIS — L84 Corns and callosities: Secondary | ICD-10-CM | POA: Diagnosis not present

## 2022-05-15 DIAGNOSIS — M0579 Rheumatoid arthritis with rheumatoid factor of multiple sites without organ or systems involvement: Secondary | ICD-10-CM | POA: Diagnosis not present

## 2022-05-15 DIAGNOSIS — M7751 Other enthesopathy of right foot: Secondary | ICD-10-CM

## 2022-05-15 DIAGNOSIS — D689 Coagulation defect, unspecified: Secondary | ICD-10-CM | POA: Diagnosis not present

## 2022-05-15 DIAGNOSIS — M2042 Other hammer toe(s) (acquired), left foot: Secondary | ICD-10-CM | POA: Diagnosis not present

## 2022-05-15 DIAGNOSIS — M2041 Other hammer toe(s) (acquired), right foot: Secondary | ICD-10-CM

## 2022-05-15 NOTE — Progress Notes (Signed)
Subjective:   Patient ID: Meghan Schwartz, female   DOB: 72 y.o.   MRN: [id number]   HPI Patient presents with severe right foot pain after having dealt with rheumatoid arthritis and severe structural malalignment.  Left foot was fixed a number of years in a marginal way but is giving her some relief but the right foot was not fixed and is giving her a lot of trouble.  Patient does not smoke at this time tries to be active if possible   Review of Systems  All other systems reviewed and are negative.       Objective:  Physical Exam Vitals and nursing note reviewed.  Constitutional:      Appearance: She is well-developed.  Pulmonary:     Effort: Pulmonary effort is normal.  Musculoskeletal:        General: Normal range of motion.  Skin:    General: Skin is warm.  Neurological:     Mental Status: She is alert.     Neurovascular status was found to be reduced with diminishment of DP PT pulses bilateral with patient found to have severe structural malalignment right over left foot with significant plantar protrusion of the metatarsals lesion second right on the medial side and inflammation around the lesser MPJs secondary to the severe structural malalignment.  Patient also has elevation of the fourth digit right which forms digital pressure against the toe and is hard for her to walk with.     Assessment:  Severe structural malalignment right over left foot secondary to rheumatoid arthritis with inflammatory capsulitis right and chronic lesion formation right     Plan:  H&P all conditions reviewed his x-rays reviewed and at this point I went ahead and I injected around the lesser MPJs 3 mg Dexasone Kenalog 5 mg Xylocaine I do believe debrided lesion right with no angiogenic bleeding she is on a blood thinner so is at high risk and I casted for a soft accommodative type orthotic to try to reduce pressure on her feet.  Patient will be seen back to recheck when orthotics are  returned  X-rays indicate severe malalignment of the forefoot right over left with multiple signs of rheumatoid arthritis systemic condition

## 2022-05-17 NOTE — Addendum Note (Signed)
Addended by: Wallene Huh on: 05/17/2022 07:39 AM   Modules accepted: Orders

## 2022-06-03 ENCOUNTER — Ambulatory Visit: Payer: Medicare PPO | Admitting: Internal Medicine

## 2022-06-03 ENCOUNTER — Other Ambulatory Visit: Payer: Self-pay | Admitting: Internal Medicine

## 2022-06-03 NOTE — Progress Notes (Deleted)
Office Visit Note  Patient: Meghan Schwartz             Date of Birth: 07-09-1950           MRN: [id number]             PCP: Pcp, No Referring: No ref. provider found Visit Date: 06/04/2022   Subjective:  No chief complaint on file.   History of Present Illness: Meghan Schwartz is a 72 y.o. female here for follow up ***   Previous HPI 05/06/22 Meghan Schwartz is a 72 y.o. female here for follow up for RA on Enbrel 50 mg subcu weekly and osteoarthritis.  Since her last visit she had an issue with worsening pedal edema and a very large associated weight gain.  She was referred to see Dr. Tenny Craw with cardiology.  She was prescribed metolazone to combine with her torsemide and saw a rapid diuresis with 15 pound weight loss in 1 day.  Initially had associated skin peeling since then still has erythema and rough or thick skin over the affected areas but without any reaccumulation of the weight and pitting fluid.  Blood pressure has been somewhat low.  She has had some falls due to instability she is noticing trouble where she cannot apply weight onto her right foot evenly just gets pressure around the base of the second and third toe.  And left knee is causing more problems with reaccumulation of the large effusion and also with posterior swelling from baker's cyst.    Previous HPI 02/28/22 Meghan Schwartz is a 71 y.o. female here for follow up for RA on Enbrel 50 mg [place] weekly and for osteoarthritis with particular worsening in left knee swelling during the past 2 weeks. She did not recall any particular injury or event hurting the knee, but has been walking and on her feet somewhat more than usual during the holidays. She had a wound on her left shin in November after hitting this and some cellulitis in the area. This was evaluated at the ED with ultrasound negative for CVT and treated with keflex and doxycycline. Mostly resolved but still slightly more swelling on the left leg than her right. She also reports  some soreness in her left upper arm and deltoid area, started after having 3 vaccines for her flu, RSV, and COVID doses this year.   Previous HPI 01/02/22 Meghan Schwartz is a 72 y.o. female here for follow up for RA on Enbrel 50 mg [place] weekly. She had been doing pretty well until about 2 weeks ago problems started after she dropped a yeti mug onto her left foot causing initially some pain and swelling in the big toe. She subsequently developed increasing erythema and swelling in the left calf this became substantial and noticed a 7 lb weight gain as well by 10/31. She saw her urology office initially treated with dose of IM rocephin and saw her cardiology office regarding this. She was directed to the ED US obtained was negative for DVT. She was prescribed a course of doxycycline and keflex and has bene taking these for 8 days with 2 days remaining. Pain and swelling is largely improved now at this point, except her left knee is still very swollen. Both anteriorly and large baker's cyst posteriorly this was noted to be about 3 cm diameter and appeared intact at ED [place] study.   Previous HPI 10/09/2021  Meghan Schwartz is a 72 y.o. female here for  follow up for RA on Enbrel 50 mg subcu weekly.  Overall most of her joint problems have been doing reasonably well but has some increased symptoms affecting her knees especially.  There is increased swelling on both sides currently having more trouble with the left knee reports crepitus and grinding sensations especially with rotational movement and when lying supine.  She has some bruising on the left forearm without specific provoking injury.  However not seeing any joint pain or swelling in those areas.   Previous HPI 08/07/2021 Meghan Schwartz is a 72 y.o. female here for follow up for RA on Enbrel 50 mg [place] weekly. She has made an effort to taking her Enbrel consistently every week. Her knee pain improved after the last steroid injection about 2 months ago which was her  worst arthritis complaint. She continues having trouble with her feet and toes limiting wearing a variety of shoes and affecting her walking. Based on last follow up with Dr. Lajoyce Corners she does not anticipate any additional surgery or procedure intervention for this coming up.    Previous HPI 06/04/2021 Meghan Schwartz is a 72 y.o. female here for follow up for RA on Enbrel 50 mg [place] weekly. She reports varying adherence to treatment plan often missing Enbrel doses and estimates that she takes it about every 10-14 days. She saw Dr. Lajoyce Corners for her painful left toe deformity on February but reports not remembering the plan very well. Foot pain and difficulty walking on this side bothers her daily. Left knee pain and swelling improved after steroid injection but is worse again for the past few weeks. Swelling in the back of the knee bothers her and limits her knee range of movement.   Previous HPI 03/05/21 Meghan Schwartz is a 72 y.o. female here for follow up for RA on Enbrel 50 mg [place] weekly. She also appeared to have considerable osteoarthritis contribution to symptoms in hands and knees as well. Inflammatory markers with mildly elevated CRP of 11.9. She has been overall well except suffered a bite to her right hand from her dog currently on augmentin after ED visit with total of 14 days prescribed. Left knee pain is worse than usual posteriorly with swelling she reports previous baker's cyst has bothered her since she had knee surgery in the past.   Previous HPI 02/05/21 Meghan Schwartz is a 72 y.o. female her for rheumatoid arthritis on Enbrel 50 mg [place] weekly for which she has been seeing Dr. Nickola Major previously. She was originally diagnosed about 10 or 11 years ago due to development of joint pain, stiffness, synovitis, and progressive joint deformities primarily starting in the feet and knees.  Subsequently proceeded to have increased joint pains involving bilateral hands with some swelling at the PIP and MCP joints.  She  started treatment with Enbrel injections with some improvement in the lower extremity joint pains she recalls still having a lot of pain symptoms and pronounced fatigue that did not clear with this treatment.  Combination treatment was tried with methotrexate with significant GI intolerance.  She tried leflunomide but developed substantial alopecia so discontinued the medicine.  She switched to hydroxychloroquine combination treatment which was being well-tolerated but she has had multiple new or worsening medical problems over the past few years with concern of medication related effects. She had hand numbness in the past improved after left carpal tunnel release surgery but she has some residual muscle atrophy in thenar prominence. She had left knee meniscectomy in  2020. She suffered NSTEMI in 01/2018 with DES placement in RCA for 99% blockage and has chronic diastolic CHF. Her edema is generally controlled while taking torsemide but does rapidly re accumulate fluid off this medication. Ophthalmology evaluation indicated significant visual field deficits findings consistent with age-related macular degeneration of both eyes and recommended avoidance of this medication.  She also has considerable osteoarthritis especially in her knees and feet previous surgical reconstruction on the left foot. Overall she feels her gait is not very stable due to these problems.   DMARD Hx Enbrel - current   HCQ - macular disease? LEF - alopecia MTX - GI intolerance.    No Rheumatology ROS completed.   PMFS History:  Patient Active Problem List   Diagnosis Date Noted   Leg edema, left 12/25/2021   Claw toe, left 08/07/2021   Dog bite 03/05/2021   High risk medication use 02/05/2021   Dry mouth 02/05/2021   Pain in left foot 01/31/2021   Bunion of great toe of right foot 06/15/2020   Hypoxia 03/05/2019   Derangement of posterior horn of medial meniscus 02/02/2019   Derangement of posterior horn of lateral  meniscus 02/02/2019   Meniscal cyst, unspecified laterality 12/01/2018   Torn medial meniscus 12/01/2018   Primary osteoarthritis of left knee 12/01/2018   Coronary artery disease involving native coronary artery of native heart without angina pectoris 09/14/2018   (HFpEF) heart failure with preserved ejection fraction    Chronic pain of left knee 05/15/2018   Mass of left knee 05/15/2018   Non-ST elevation (NSTEMI) myocardial infarction    Chest pain 01/25/2018   RA (rheumatoid arthritis) 01/25/2018   Hyperlipidemia 01/25/2018   Hypokalemia 01/25/2018   Depression with anxiety 01/25/2018   Tobacco abuse 01/25/2018   Lower extremity cellulitis 01/25/2018   Benzodiazepine dependence    MDD (major depressive disorder), recurrent severe, without psychosis 10/02/2017   CTS (carpal tunnel syndrome) 06/08/2014    Past Medical History:  Diagnosis Date   Allergic rhinitis    Anxiety    Benzodiazepine dependence (HCC)    Bruxism    CAD (coronary artery disease)    S/p NSTEMI 01/2018 >> LHC: oLAD 20, pLAD 20; oD1 20, oLCx 40, pRCA 99 >> PCI: DES to prox RCA // Echo 01/2018: EF 50-55; prob inf-lat and inf HK   Chronic diastolic heart failure    Echocardiogram 07/2018: EF 55-60, grade 1 diastolic dysfunction, normal wall motion, normal GLS (-22.3), PASP 28   Chronic foot pain    bunions, torn ligaments-on chronic pain medication   CTS (carpal tunnel syndrome) 06/08/2014   Depression    High cholesterol    Hypertension    MDD (major depressive disorder) 10/02/2017   Migraine    Myocardial infarction (HCC) 02/01/2018   Pneumonia 1986   left lung   RA (rheumatoid arthritis) (HCC)     Family History  Problem Relation Age of Onset   Thyroid disease Mother    Breast cancer Mother    Parkinson's disease Father    Raynaud syndrome Maternal Aunt    Arthritis Maternal Grandmother    Heart attack Maternal Grandmother    Stroke Paternal Grandmother    Heart attack Paternal Grandfather     Past Surgical History:  Procedure Laterality Date   BTL  2000   CARPAL TUNNEL RELEASE Left    CESAREAN SECTION  1978. 1983   CORONARY STENT INTERVENTION N/A 01/27/2018   Procedure: CORONARY STENT INTERVENTION;  Surgeon: Kathleene Hazel, MD;  Location: [facility];  Service: Cardiovascular;  Laterality: N/A;   eye implants     FOOT SURGERY Left 2008   front teeth removed after injury     KNEE ARTHROSCOPY WITH LATERAL MENISECTOMY Left 02/02/2019   Procedure: LEFT KNEE ARTHROSCOPY, MEDIAL AND LATERAL MENISECTOMY, EXCISION OF LEFT LATERAL MENISCAL CYST;  Surgeon: Valeria BatmanWhitfield, Peter W, MD;  Location: [facility];  Service: Orthopedics;  Laterality: Left;   LEFT HEART CATH AND CORONARY ANGIOGRAPHY N/A 01/27/2018   Procedure: LEFT HEART CATH AND CORONARY ANGIOGRAPHY;  Surgeon: Kathleene HazelMcAlhany, Kirstin Kugler D, MD;  Location: [facility];  Service: Cardiovascular;  Laterality: N/A;   pre cancerous lesion removed from forehead     TOTAL ABDOMINAL HYSTERECTOMY W/ BILATERAL SALPINGOOPHORECTOMY  2002   Dr. Jackelyn KnifeMeisinger   VAGINAL HYSTERECTOMY  2001   Social History   Social History Narrative   Lives at home with spouse.    Right handed.   Caffeine use: 2 cups coffee/day    8 oz soda/day    Immunization History  Administered Date(s) Administered   PFIZER(Purple Top)SARS-COV-2 Vaccination 04/10/2019, 05/05/2019, 10/19/2019, 06/20/2020   Pfizer Covid-19 Vaccine Bivalent Booster 2276yrs & up 11/29/2020   Tdap 04/03/2018     Objective: Vital Signs: There were no vitals taken for this visit.   Physical Exam   Musculoskeletal Exam: ***  CDAI Exam: CDAI Score: -- Patient Global: --; Provider Global: -- Swollen: --; Tender: -- Joint Exam 06/04/2022   No joint exam has been documented for this visit   There is currently no information documented on the homunculus. Go to the Rheumatology activity and complete the homunculus joint exam.  Investigation: No additional  findings.  Imaging: DG Foot 2 Views Left  Result Date: 05/20/2022 Please see detailed radiograph report in office note.  DG Foot 2 Views Right  Result Date: 05/20/2022 Please see detailed radiograph report in office note.  ECHOCARDIOGRAM COMPLETE  Result Date: 05/09/2022    ECHOCARDIOGRAM REPORT   Patient Name:   Caroleen HammanDEBRA H Fatima  Date of Exam: 05/09/2022 Medical Rec #:  [id number]     Height:       64.0 in Accession #:    [id number]    Weight:       158.0 lb Date of Birth:  05/14/1950     BSA:          1.770 m Patient Age:    71 years      BP:           91/84 mmHg Patient Gender: F             HR:           74 bpm. Exam Location:  [address] Procedure: 2D Echo, Cardiac Doppler and Color Doppler Indications:    R60.9 Edema  History:        Patient has prior history of Echocardiogram examinations, most                 recent 09/21/2020. CAD and Previous Myocardial Infarction; Risk                 Factors:Hypertension and Dyslipidemia. Rheumatoid Arthritis.  Sonographer:    Daphine DeutscherNaTashia Rodgers-Jones RDCS Referring Phys: [id number] PAULA V ROSS IMPRESSIONS  1. Left ventricular ejection fraction, by estimation, is 55 to 60%. The left ventricle has normal function. The left ventricle has no regional wall motion abnormalities. Left ventricular diastolic parameters are consistent with Grade I diastolic dysfunction (impaired relaxation).  2.  Right ventricular systolic function is normal. The right ventricular size is normal.  3. The mitral valve is normal in structure. No evidence of mitral valve regurgitation. No evidence of mitral stenosis.  4. The aortic valve is normal in structure. Aortic valve regurgitation is not visualized. No aortic stenosis is present.  5. The inferior vena cava is normal in size with greater than 50% respiratory variability, suggesting right atrial pressure of 3 mmHg. FINDINGS  Left Ventricle: Left ventricular ejection fraction, by estimation, is 55 to 60%. The left ventricle has normal function.  The left ventricle has no regional wall motion abnormalities. The left ventricular internal cavity size was normal in size. There is  no left ventricular hypertrophy. Left ventricular diastolic parameters are consistent with Grade I diastolic dysfunction (impaired relaxation). Normal left ventricular filling pressure. Right Ventricle: The right ventricular size is normal. No increase in right ventricular wall thickness. Right ventricular systolic function is normal. Left Atrium: Left atrial size was normal in size. Right Atrium: Right atrial size was normal in size. Pericardium: There is no evidence of pericardial effusion. Mitral Valve: The mitral valve is normal in structure. No evidence of mitral valve regurgitation. No evidence of mitral valve stenosis. Tricuspid Valve: The tricuspid valve is normal in structure. Tricuspid valve regurgitation is not demonstrated. No evidence of tricuspid stenosis. Aortic Valve: The aortic valve is normal in structure. Aortic valve regurgitation is not visualized. No aortic stenosis is present. Pulmonic Valve: The pulmonic valve was normal in structure. Pulmonic valve regurgitation is not visualized. No evidence of pulmonic stenosis. Aorta: The aortic root is normal in size and structure. Venous: The inferior vena cava is normal in size with greater than 50% respiratory variability, suggesting right atrial pressure of 3 mmHg. IAS/Shunts: No atrial level shunt detected by color flow Doppler.  LEFT VENTRICLE PLAX 2D LVIDd:         4.20 cm   Diastology LVIDs:         2.90 cm   LV e' medial:    8.38 cm/s LV PW:         0.60 cm   LV E/e' medial:  8.9 LV IVS:        0.70 cm   LV e' lateral:   9.36 cm/s LVOT diam:     2.00 cm   LV E/e' lateral: 7.9 LV SV:         61 LV SV Index:   34 LVOT Area:     3.14 cm  RIGHT VENTRICLE             IVC RV Basal diam:  3.00 cm     IVC diam: 1.60 cm RV S prime:     16.60 cm/s TAPSE (M-mode): 2.2 cm LEFT ATRIUM             Index        RIGHT ATRIUM            Index LA diam:        3.50 cm 1.98 cm/m   RA Area:     10.00 cm LA Vol (A2C):   17.7 ml 10.00 ml/m  RA Volume:   21.20 ml  11.98 ml/m LA Vol (A4C):   19.0 ml 10.74 ml/m LA Biplane Vol: 18.5 ml 10.45 ml/m  AORTIC VALVE LVOT Vmax:   98.33 cm/s LVOT Vmean:  64.600 cm/s LVOT VTI:    0.194 m  AORTA Ao Root diam: 3.50 cm Ao Asc diam:  3.40 cm MITRAL  VALVE MV Area (PHT): 2.76 cm     SHUNTS MV Decel Time: 275 msec     Systemic VTI:  0.19 m MV E velocity: 74.40 cm/s   Systemic Diam: 2.00 cm MV A velocity: 110.00 cm/s MV E/A ratio:  0.68 Chilton Si MD Electronically signed by Chilton Si MD Signature Date/Time: 05/09/2022/10:32:27 PM    Final     Recent Labs: Lab Results  Component Value Date   WBC 3.0 (L) 02/28/2022   HGB 11.1 (L) 02/28/2022   PLT 179 02/28/2022   NA 142 04/23/2022   K 3.7 04/23/2022   CL 92 (L) 04/23/2022   CO2 34 (H) 04/23/2022   GLUCOSE 111 (H) 04/23/2022   BUN 23 04/23/2022   CREATININE 1.06 (H) 04/23/2022   BILITOT 0.4 02/28/2022   ALKPHOS 113 12/25/2021   AST 18 02/28/2022   ALT 11 02/28/2022   PROT 6.7 02/28/2022   ALBUMIN 4.2 12/25/2021   CALCIUM 10.2 04/23/2022   GFRAA 65 02/04/2020   QFTBGOLDPLUS NEGATIVE 07/10/2021    Speciality Comments: No specialty comments available.  Procedures:  No procedures performed Allergies: Erythromycin   Assessment / Plan:     Visit Diagnoses: No diagnosis found.  ***  Orders: No orders of the defined types were placed in this encounter.  No orders of the defined types were placed in this encounter.    Follow-Up Instructions: No follow-ups on file.   Fuller Plan, MD  Note - This record has been created using [employer].  Chart creation errors have been sought, but may not always  have been located. Such creation errors do not reflect on  the standard of medical care.

## 2022-06-04 ENCOUNTER — Ambulatory Visit: Payer: Medicare PPO | Admitting: Internal Medicine

## 2022-06-04 DIAGNOSIS — I5032 Chronic diastolic (congestive) heart failure: Secondary | ICD-10-CM | POA: Diagnosis not present

## 2022-06-06 DIAGNOSIS — F332 Major depressive disorder, recurrent severe without psychotic features: Secondary | ICD-10-CM | POA: Diagnosis not present

## 2022-06-06 DIAGNOSIS — F1121 Opioid dependence, in remission: Secondary | ICD-10-CM | POA: Diagnosis not present

## 2022-06-06 DIAGNOSIS — F411 Generalized anxiety disorder: Secondary | ICD-10-CM | POA: Diagnosis not present

## 2022-06-06 DIAGNOSIS — F1320 Sedative, hypnotic or anxiolytic dependence, uncomplicated: Secondary | ICD-10-CM | POA: Diagnosis not present

## 2022-06-12 ENCOUNTER — Encounter: Payer: Self-pay | Admitting: Internal Medicine

## 2022-06-12 ENCOUNTER — Ambulatory Visit: Payer: Medicare PPO | Attending: Internal Medicine | Admitting: Internal Medicine

## 2022-06-12 VITALS — BP 96/62 | HR 73 | Resp 12 | Ht 63.0 in | Wt 157.0 lb

## 2022-06-12 DIAGNOSIS — Z79899 Other long term (current) drug therapy: Secondary | ICD-10-CM | POA: Diagnosis not present

## 2022-06-12 DIAGNOSIS — M25462 Effusion, left knee: Secondary | ICD-10-CM

## 2022-06-12 DIAGNOSIS — M069 Rheumatoid arthritis, unspecified: Secondary | ICD-10-CM | POA: Diagnosis not present

## 2022-06-12 NOTE — Progress Notes (Signed)
Office Visit Note  Patient: Meghan Schwartz             Date of Birth: 08-17-1950           MRN: [id number]             PCP: Pcp, No Referring: No ref. provider found Visit Date: 06/12/2022   Subjective:  Follow-up   History of Present Illness: Meghan Schwartz is a 72 y.o. female here for follow up for RA on Enbrel 50 mg [place] weekly and osteoarthritis with chronic left knee pain and swelling. We aspirated the left knee in March but she saw quick re accumulation of fluid.  Outside of left knee has not experienced much exacerbation of upper extremity pain.  Also describes some clicking or popping sensation frequently occurring and with decreased steadiness on her feet.  She is usually able to walk adequately with some form of assistive device at least a cane.  Previous HPI 05/06/22 Meghan Schwartz is a 72 y.o. female here for follow up for RA on Enbrel 50 mg subcu weekly and osteoarthritis.  Since her last visit she had an issue with worsening pedal edema and a very large associated weight gain.  She was referred to see Dr. Tenny Craw with cardiology.  She was prescribed metolazone to combine with her torsemide and saw a rapid diuresis with 15 pound weight loss in 1 day.  Initially had associated skin peeling since then still has erythema and rough or thick skin over the affected areas but without any reaccumulation of the weight and pitting fluid.  Blood pressure has been somewhat low.  She has had some falls due to instability she is noticing trouble where she cannot apply weight onto her right foot evenly just gets pressure around the base of the second and third toe.  And left knee is causing more problems with reaccumulation of the large effusion and also with posterior swelling from baker's cyst.    Previous HPI 02/28/22 Meghan Schwartz is a 72 y.o. female here for follow up for RA on Enbrel 50 mg [place] weekly and for osteoarthritis with particular worsening in left knee swelling during the past 2 weeks. She did  not recall any particular injury or event hurting the knee, but has been walking and on her feet somewhat more than usual during the holidays. She had a wound on her left shin in November after hitting this and some cellulitis in the area. This was evaluated at the ED with ultrasound negative for CVT and treated with keflex and doxycycline. Mostly resolved but still slightly more swelling on the left leg than her right. She also reports some soreness in her left upper arm and deltoid area, started after having 3 vaccines for her flu, RSV, and COVID doses this year.   Previous HPI 01/02/22 Meghan Schwartz is a 72 y.o. female here for follow up for RA on Enbrel 50 mg [place] weekly. She had been doing pretty well until about 2 weeks ago problems started after she dropped a yeti mug onto her left foot causing initially some pain and swelling in the big toe. She subsequently developed increasing erythema and swelling in the left calf this became substantial and noticed a 7 lb weight gain as well by 10/31. She saw her urology office initially treated with dose of IM rocephin and saw her cardiology office regarding this. She was directed to the ED US obtained was negative for DVT. She was prescribed  a course of doxycycline and keflex and has bene taking these for 8 days with 2 days remaining. Pain and swelling is largely improved now at this point, except her left knee is still very swollen. Both anteriorly and large baker's cyst posteriorly this was noted to be about 3 cm diameter and appeared intact at ED [place] study.   Previous HPI 10/09/2021  Meghan Schwartz is a 72 y.o. female here for follow up for RA on Enbrel 50 mg subcu weekly.  Overall most of her joint problems have been doing reasonably well but has some increased symptoms affecting her knees especially.  There is increased swelling on both sides currently having more trouble with the left knee reports crepitus and grinding sensations especially with rotational  movement and when lying supine.  She has some bruising on the left forearm without specific provoking injury.  However not seeing any joint pain or swelling in those areas.   Previous HPI 08/07/2021 Meghan Schwartz is a 72 y.o. female here for follow up for RA on Enbrel 50 mg [place] weekly. She has made an effort to taking her Enbrel consistently every week. Her knee pain improved after the last steroid injection about 2 months ago which was her worst arthritis complaint. She continues having trouble with her feet and toes limiting wearing a variety of shoes and affecting her walking. Based on last follow up with Dr. Lajoyce Corners she does not anticipate any additional surgery or procedure intervention for this coming up.    Previous HPI 06/04/2021 Meghan Schwartz is a 72 y.o. female here for follow up for RA on Enbrel 50 mg [place] weekly. She reports varying adherence to treatment plan often missing Enbrel doses and estimates that she takes it about every 10-14 days. She saw Dr. Lajoyce Corners for her painful left toe deformity on February but reports not remembering the plan very well. Foot pain and difficulty walking on this side bothers her daily. Left knee pain and swelling improved after steroid injection but is worse again for the past few weeks. Swelling in the back of the knee bothers her and limits her knee range of movement.   Previous HPI 03/05/21 Meghan Schwartz is a 72 y.o. female here for follow up for RA on Enbrel 50 mg [place] weekly. She also appeared to have considerable osteoarthritis contribution to symptoms in hands and knees as well. Inflammatory markers with mildly elevated CRP of 11.9. She has been overall well except suffered a bite to her right hand from her dog currently on augmentin after ED visit with total of 14 days prescribed. Left knee pain is worse than usual posteriorly with swelling she reports previous baker's cyst has bothered her since she had knee surgery in the past.   Previous HPI 02/05/21 Meghan Schwartz  is a 72 y.o. female her for rheumatoid arthritis on Enbrel 50 mg [place] weekly for which she has been seeing Dr. Nickola Major previously. She was originally diagnosed about 10 or 11 years ago due to development of joint pain, stiffness, synovitis, and progressive joint deformities primarily starting in the feet and knees.  Subsequently proceeded to have increased joint pains involving bilateral hands with some swelling at the PIP and MCP joints.  She started treatment with Enbrel injections with some improvement in the lower extremity joint pains she recalls still having a lot of pain symptoms and pronounced fatigue that did not clear with this treatment.  Combination treatment was tried with methotrexate with significant GI  intolerance.  She tried leflunomide but developed substantial alopecia so discontinued the medicine.  She switched to hydroxychloroquine combination treatment which was being well-tolerated but she has had multiple new or worsening medical problems over the past few years with concern of medication related effects. She had hand numbness in the past improved after left carpal tunnel release surgery but she has some residual muscle atrophy in thenar prominence. She had left knee meniscectomy in 2020. She suffered NSTEMI in 01/2018 with DES placement in RCA for 99% blockage and has chronic diastolic CHF. Her edema is generally controlled while taking torsemide but does rapidly re accumulate fluid off this medication. Ophthalmology evaluation indicated significant visual field deficits findings consistent with age-related macular degeneration of both eyes and recommended avoidance of this medication.  She also has considerable osteoarthritis especially in her knees and feet previous surgical reconstruction on the left foot. Overall she feels her gait is not very stable due to these problems.   DMARD Hx Enbrel - current   HCQ - macular disease? LEF - alopecia MTX - GI intolerance.    Review of  Systems  Constitutional:  Positive for fatigue.  HENT:  Positive for mouth sores and mouth dryness.   Eyes:  Negative for dryness.  Respiratory:  Positive for shortness of breath.   Cardiovascular:  Negative for chest pain and palpitations.  Gastrointestinal:  Negative for blood in stool, constipation and diarrhea.  Endocrine: Positive for increased urination.  Genitourinary:  Positive for involuntary urination.  Musculoskeletal:  Positive for joint pain, gait problem, joint pain, joint swelling, myalgias, muscle weakness, morning stiffness, muscle tenderness and myalgias.  Skin:  Positive for color change and sensitivity to sunlight. Negative for rash and hair loss.  Allergic/Immunologic: Negative for susceptible to infections.  Neurological:  Positive for dizziness. Negative for headaches.  Hematological:  Negative for swollen glands.  Psychiatric/Behavioral:  Positive for depressed mood. Negative for sleep disturbance. The patient is nervous/anxious.     PMFS History:  Patient Active Problem List   Diagnosis Date Noted   Effusion, left knee 06/12/2022   Leg edema, left 12/25/2021   Claw toe, left 08/07/2021   Dog bite 03/05/2021   High risk medication use 02/05/2021   Dry mouth 02/05/2021   Pain in left foot 01/31/2021   Bunion of great toe of right foot 06/15/2020   Hypoxia 03/05/2019   Derangement of posterior horn of medial meniscus 02/02/2019   Derangement of posterior horn of lateral meniscus 02/02/2019   Meniscal cyst, unspecified laterality 12/01/2018   Torn medial meniscus 12/01/2018   Primary osteoarthritis of left knee 12/01/2018   Coronary artery disease involving native coronary artery of native heart without angina pectoris 09/14/2018   (HFpEF) heart failure with preserved ejection fraction    Chronic pain of left knee 05/15/2018   Mass of left knee 05/15/2018   Non-ST elevation (NSTEMI) myocardial infarction    Chest pain 01/25/2018   RA (rheumatoid arthritis)  01/25/2018   Hyperlipidemia 01/25/2018   Hypokalemia 01/25/2018   Depression with anxiety 01/25/2018   Tobacco abuse 01/25/2018   Lower extremity cellulitis 01/25/2018   Benzodiazepine dependence    MDD (major depressive disorder), recurrent severe, without psychosis 10/02/2017   CTS (carpal tunnel syndrome) 06/08/2014    Past Medical History:  Diagnosis Date   Allergic rhinitis    Anxiety    Benzodiazepine dependence    Bruxism    CAD (coronary artery disease)    S/p NSTEMI 01/2018 >> LHC: oLAD 20, pLAD  20; oD1 20, oLCx 40, pRCA 99 >> PCI: DES to prox RCA // Echo 01/2018: EF 50-55; prob inf-lat and inf HK   Chronic diastolic heart failure    Echocardiogram 07/2018: EF 55-60, grade 1 diastolic dysfunction, normal wall motion, normal GLS (-22.3), PASP 28   Chronic foot pain    bunions, torn ligaments-on chronic pain medication   CTS (carpal tunnel syndrome) 06/08/2014   Depression    High cholesterol    Hypertension    MDD (major depressive disorder) 10/02/2017   Migraine    Myocardial infarction 02/01/2018   Pneumonia 1986   left lung   RA (rheumatoid arthritis)     Family History  Problem Relation Age of Onset   Thyroid disease Mother    Breast cancer Mother    Parkinson's disease Father    Raynaud syndrome Maternal Aunt    Arthritis Maternal Grandmother    Heart attack Maternal Grandmother    Stroke Paternal Grandmother    Heart attack Paternal Grandfather    Past Surgical History:  Procedure Laterality Date   BTL  2000   CARPAL TUNNEL RELEASE Left    CESAREAN SECTION  1978. 1983   CORONARY STENT INTERVENTION N/A 01/27/2018   Procedure: CORONARY STENT INTERVENTION;  Surgeon: Kathleene Hazel, MD;  Location: [facility];  Service: Cardiovascular;  Laterality: N/A;   eye implants     FOOT SURGERY Left 2008   front teeth removed after injury     KNEE ARTHROSCOPY WITH LATERAL MENISECTOMY Left 02/02/2019   Procedure: LEFT KNEE ARTHROSCOPY, MEDIAL AND  LATERAL MENISECTOMY, EXCISION OF LEFT LATERAL MENISCAL CYST;  Surgeon: Valeria Batman, MD;  Location: [facility];  Service: Orthopedics;  Laterality: Left;   LEFT HEART CATH AND CORONARY ANGIOGRAPHY N/A 01/27/2018   Procedure: LEFT HEART CATH AND CORONARY ANGIOGRAPHY;  Surgeon: Kathleene Hazel, MD;  Location: [facility];  Service: Cardiovascular;  Laterality: N/A;   pre cancerous lesion removed from forehead     TOTAL ABDOMINAL HYSTERECTOMY W/ BILATERAL SALPINGOOPHORECTOMY  2002   Dr. Jackelyn Knife   VAGINAL HYSTERECTOMY  2001   Social History   Social History Narrative   Lives at home with spouse.    Right handed.   Caffeine use: 2 cups coffee/day    8 oz soda/day    Immunization History  Administered Date(s) Administered   PFIZER(Purple Top)SARS-COV-2 Vaccination 04/10/2019, 05/05/2019, 10/19/2019, 06/20/2020   Pfizer Covid-19 Vaccine Bivalent Booster 61yrs & up 11/29/2020   Tdap 04/03/2018     Objective: Vital Signs: BP 96/62 (BP Location: Left Arm, Patient Position: Sitting, Cuff Size: Normal)   Pulse 73   Resp 12   Ht 5\' 3"  (1.6 m)   Wt 157 lb (71.2 kg)   BMI 27.81 kg/m    Physical Exam Constitutional:      Comments: Drowsy  Cardiovascular:     Rate and Rhythm: Normal rate and regular rhythm.  Pulmonary:     Effort: Pulmonary effort is normal.     Breath sounds: Normal breath sounds.  Skin:    General: Skin is warm and dry.     Findings: No rash.  Neurological:     Mental Status: She is alert.      Musculoskeletal Exam:  Shoulders full ROM no tenderness or swelling Elbows full ROM no tenderness or swelling Wrists full ROM no tenderness or swelling Fingers full ROM no tenderness or swelling Large left knee effusion, restricted ROM, tenderness on posterior side and with  full extension, no warmth or erythema Ankles full ROM no tenderness or swelling MTP squeeze tenderness b/l   CDAI Exam: CDAI Score: -- Patient Global: --; Provider Global:  -- Swollen: 1 ; Tender: 1  Joint Exam 06/12/2022      Right  Left  Knee     Swollen Tender     Investigation: No additional findings.  Imaging: DG Foot 2 Views Left  Result Date: 05/20/2022 Please see detailed radiograph report in office note.  DG Foot 2 Views Right  Result Date: 05/20/2022 Please see detailed radiograph report in office note.   Recent Labs: Lab Results  Component Value Date   WBC 3.0 (L) 02/28/2022   HGB 11.1 (L) 02/28/2022   PLT 179 02/28/2022   NA 142 04/23/2022   K 3.7 04/23/2022   CL 92 (L) 04/23/2022   CO2 34 (H) 04/23/2022   GLUCOSE 111 (H) 04/23/2022   BUN 23 04/23/2022   CREATININE 1.06 (H) 04/23/2022   BILITOT 0.4 02/28/2022   ALKPHOS 113 12/25/2021   AST 18 02/28/2022   ALT 11 02/28/2022   PROT 6.7 02/28/2022   ALBUMIN 4.2 12/25/2021   CALCIUM 10.2 04/23/2022   GFRAA 65 02/04/2020   QFTBGOLDPLUS NEGATIVE 07/10/2021    Speciality Comments: No specialty comments available.  Procedures:  Large Joint Inj on 06/12/2022 2:45 PM Indications: pain, joint swelling and diagnostic evaluation Details: 22 G 1.5 in needle, superolateral approach Medications: 3 mL lidocaine 1 %; 80 mg triamcinolone acetonide 40 MG/ML Aspirate: cloudy, yellow and blood-tinged; sent for lab analysis Outcome: tolerated well, no immediate complications Procedure, treatment alternatives, risks and benefits explained, specific risks discussed. Consent was given by the patient. Immediately prior to procedure a time out was called to verify the correct patient, procedure, equipment, support staff and site/side marked as required. Patient was prepped and draped in the usual sterile fashion.     Allergies: Erythromycin   Assessment / Plan:     Visit Diagnoses: Rheumatoid arthritis, involving unspecified site, unspecified whether rheumatoid factor present - Plan: Sedimentation rate  Rheumatoid arthritis appears fairly well-controlled only active synovitis in the left  knee.  Checking sedimentation rate to monitor disease activity.  Plan to continue the Enbrel 50 mg subcu weekly.  High risk medication use - Plan: CBC with Differential/Platelet, COMPLETE METABOLIC PANEL WITH GFR, QuantiFERON-TB Gold Plus  Checking CBC CMP and QuantiFERON for medication monitoring on long-term use of Enbrel.  No serious interval infections.  Effusion, left knee  Left knee symptoms are getting worse again she is also complaining a lot about pain in the back of the knee with some popping and instability.  Aspiration and steroid injection in clinic today with both a small volume aspirated from Baker's cyst and large volume from suprapatellar pouch.  Concerned about the knee instability complaint along with the osteoarthritis pain that has been ongoing for a while.  Referring to orthopedic surgery urgently to take she may benefit with some kind of supporting brace for knee stabilization.  Orders: Orders Placed This Encounter  Procedures   Large Joint Inj   Sedimentation rate   CBC with Differential/Platelet   COMPLETE METABOLIC PANEL WITH GFR   QuantiFERON-TB Gold Plus   No orders of the defined types were placed in this encounter.    Follow-Up Instructions: Return in about 3 months (around 09/11/2022) for RA on ENB/inj f/u 3mos.   Fuller Plan, MD  Note - This record has been created using [employer].  Chart creation  errors have been sought, but may not always  have been located. Such creation errors do not reflect on  the standard of medical care.  

## 2022-06-13 ENCOUNTER — Telehealth: Payer: Self-pay | Admitting: Podiatry

## 2022-06-13 NOTE — Telephone Encounter (Signed)
Pt left message at 1231pm today checking to see if maybe she missed a call from our office. She was scanned for orthotics at last appt. She is anxious to get them. Please call pt with an update

## 2022-06-13 NOTE — Progress Notes (Signed)
Sedimentation rate of 33 is above normal but not much different compared to 27 last time. Blood count and metabolic panel are fine for continuing Enbrel. We referred her to orthopedics for her knee since I think the instability is contributing to some of the pain and swelling.

## 2022-06-14 ENCOUNTER — Other Ambulatory Visit (INDEPENDENT_AMBULATORY_CARE_PROVIDER_SITE_OTHER): Payer: Medicare PPO

## 2022-06-14 ENCOUNTER — Encounter: Payer: Self-pay | Admitting: Orthopedic Surgery

## 2022-06-14 ENCOUNTER — Ambulatory Visit: Payer: Medicare PPO | Admitting: Orthopedic Surgery

## 2022-06-14 DIAGNOSIS — M1712 Unilateral primary osteoarthritis, left knee: Secondary | ICD-10-CM | POA: Diagnosis not present

## 2022-06-14 LAB — CBC WITH DIFFERENTIAL/PLATELET
Absolute Monocytes: 433 cells/uL (ref 200–950)
Basophils Absolute: 31 cells/uL (ref 0–200)
Basophils Relative: 0.8 %
Eosinophils Absolute: 261 cells/uL (ref 15–500)
Eosinophils Relative: 6.7 %
HCT: 34.5 % — ABNORMAL LOW (ref 35.0–45.0)
Hemoglobin: 11.3 g/dL — ABNORMAL LOW (ref 11.7–15.5)
Lymphs Abs: 1349 cells/uL (ref 850–3900)
MCH: 26.4 pg — ABNORMAL LOW (ref 27.0–33.0)
MCHC: 32.8 g/dL (ref 32.0–36.0)
MCV: 80.6 fL (ref 80.0–100.0)
MPV: 9.3 fL (ref 7.5–12.5)
Monocytes Relative: 11.1 %
Neutro Abs: 1825 cells/uL (ref 1500–7800)
Neutrophils Relative %: 46.8 %
Platelets: 190 10*3/uL (ref 140–400)
RBC: 4.28 10*6/uL (ref 3.80–5.10)
RDW: 12 % (ref 11.0–15.0)
Total Lymphocyte: 34.6 %
WBC: 3.9 10*3/uL (ref 3.8–10.8)

## 2022-06-14 LAB — COMPLETE METABOLIC PANEL WITH GFR
AG Ratio: 1.4 (calc) (ref 1.0–2.5)
ALT: 13 U/L (ref 6–29)
AST: 19 U/L (ref 10–35)
Albumin: 3.9 g/dL (ref 3.6–5.1)
Alkaline phosphatase (APISO): 108 U/L (ref 37–153)
BUN/Creatinine Ratio: 19 (calc) (ref 6–22)
BUN: 21 mg/dL (ref 7–25)
CO2: 34 mmol/L — ABNORMAL HIGH (ref 20–32)
Calcium: 9.3 mg/dL (ref 8.6–10.4)
Chloride: 100 mmol/L (ref 98–110)
Creat: 1.08 mg/dL — ABNORMAL HIGH (ref 0.60–1.00)
Globulin: 2.7 g/dL (calc) (ref 1.9–3.7)
Glucose, Bld: 100 mg/dL — ABNORMAL HIGH (ref 65–99)
Potassium: 3.6 mmol/L (ref 3.5–5.3)
Sodium: 142 mmol/L (ref 135–146)
Total Bilirubin: 0.6 mg/dL (ref 0.2–1.2)
Total Protein: 6.6 g/dL (ref 6.1–8.1)
eGFR: 55 mL/min/{1.73_m2} — ABNORMAL LOW (ref >=60)

## 2022-06-14 LAB — QUANTIFERON-TB GOLD PLUS
Mitogen-NIL: 4.24 IU/mL
NIL: 0.19 IU/mL
QuantiFERON-TB Gold Plus: NEGATIVE
TB1-NIL: <0 IU/mL
TB2-NIL: <0 IU/mL

## 2022-06-14 LAB — SEDIMENTATION RATE: Sed Rate: 33 mm/h — ABNORMAL HIGH (ref 0–30)

## 2022-06-14 MED ORDER — LIDOCAINE HCL 1 % IJ SOLN
5.0000 mL | INTRAMUSCULAR | Status: AC | PRN
Start: 2022-06-14 — End: 2022-06-14
  Administered 2022-06-14: 5 mL

## 2022-06-14 NOTE — Progress Notes (Addendum)
Office Visit Note   Patient: Meghan Schwartz           Date of Birth: 1950-12-02           MRN: [id number] Visit Date: 06/14/2022 Requested by: Fuller Plan, MD [address] [place],  [place] [address] PCP: Pcp, No  Subjective: Chief Complaint  Patient presents with   Left Knee - Pain    HPI: Meghan Schwartz is a 72 y.o. female who presents to the office reporting left knee pain.  Patient describes long history of left knee pain.  She has rheumatoid arthritis and takes Enbrel.  Also has a history of myocardial infarction and has known history of venous stasis.  She gets episodic aspirations with her rheumatologist.  She did have prior right knee arthroscopy 2 years ago and developed a Baker's cyst in her knee after that.  Reports swelling and pain which wakes her from sleep at night.  She does take Plavix.  No personal or family history of DVT.  Did have a ulcer on that left leg which took a while to heal.  ABIs in the chart are reviewed and they were approximately 4 years ago..                ROS: All systems reviewed are negative as they relate to the chief complaint within the history of present illness.  Patient denies fevers or chills.  Assessment & Plan: Visit Diagnoses:  1. Primary osteoarthritis of left knee     Plan: Impression is severe left knee arthritis with correctable valgus instability.  I think trying a valgus unloader brace would be helpful.  She does have instability in that knee along with significant pain which could be helped with this brace.  Aspiration of the knee performed today as well and we did get out about 45 cc.  Follow-up in 3 months for clinical recheck.  Follow-Up Instructions: No follow-ups on file.   Orders:  Orders Placed This Encounter  Procedures   XR KNEE 3 VIEW LEFT   No orders of the defined types were placed in this encounter.     Procedures: Large Joint Inj: L knee on 06/14/2022 3:22 PM Indications: diagnostic  evaluation, joint swelling and pain Details: 18 G 1.5 in needle, superolateral approach  Arthrogram: No  Medications: 5 mL lidocaine 1 % Aspirate: 45 mL serous Outcome: tolerated well, no immediate complications Procedure, treatment alternatives, risks and benefits explained, specific risks discussed. Consent was given by the patient. Immediately prior to procedure a time out was called to verify the correct patient, procedure, equipment, support staff and site/side marked as required. Patient was prepped and draped in the usual sterile fashion.       Clinical Data: No additional findings.  Objective: Vital Signs: There were no vitals taken for this visit.  Physical Exam:  Constitutional: Patient appears well-developed HEENT:  Head: Normocephalic Eyes:EOM are normal Neck: Normal range of motion Cardiovascular: Normal rate Pulmonary/chest: Effort normal Neurologic: Patient is alert Skin: Skin is warm Psychiatric: Patient has normal mood and affect  Ortho Exam: Ortho exam demonstrates perfused feet and mild venous stasis changes in bilateral lower extremities.  Ankle dorsiflexion plantarflexion is intact.  She has range of motion of 5-1 10.  She does have correctable valgus instability in that left knee.  I can bring her back to neutral with varus stress.  She does have some laxity on that medial side as well.  Extensor mechanism intact.  Specialty Comments:  No specialty comments available.  Imaging: XR KNEE 3 VIEW LEFT  Result Date: 06/14/2022 AP lateral merchant radiographs left knee reviewed.  Moderate valgus alignment is present.  Severe end-stage tricompartmental arthritis is present without fracture.  Bones slightly osteopenic.    PMFS History: Patient Active Problem List   Diagnosis Date Noted   Effusion, left knee 06/12/2022   Leg edema, left 12/25/2021   Claw toe, left 08/07/2021   Dog bite 03/05/2021   High risk medication use 02/05/2021   Dry mouth  02/05/2021   Pain in left foot 01/31/2021   Bunion of great toe of right foot 06/15/2020   Hypoxia 03/05/2019   Derangement of posterior horn of medial meniscus 02/02/2019   Derangement of posterior horn of lateral meniscus 02/02/2019   Meniscal cyst, unspecified laterality 12/01/2018   Torn medial meniscus 12/01/2018   Primary osteoarthritis of left knee 12/01/2018   Coronary artery disease involving native coronary artery of native heart without angina pectoris 09/14/2018   (HFpEF) heart failure with preserved ejection fraction    Chronic pain of left knee 05/15/2018   Mass of left knee 05/15/2018   Non-ST elevation (NSTEMI) myocardial infarction    Chest pain 01/25/2018   RA (rheumatoid arthritis) 01/25/2018   Hyperlipidemia 01/25/2018   Hypokalemia 01/25/2018   Depression with anxiety 01/25/2018   Tobacco abuse 01/25/2018   Lower extremity cellulitis 01/25/2018   Benzodiazepine dependence    MDD (major depressive disorder), recurrent severe, without psychosis 10/02/2017   CTS (carpal tunnel syndrome) 06/08/2014   Past Medical History:  Diagnosis Date   Allergic rhinitis    Anxiety    Benzodiazepine dependence    Bruxism    CAD (coronary artery disease)    S/p NSTEMI 01/2018 >> LHC: oLAD 20, pLAD 20; oD1 20, oLCx 40, pRCA 99 >> PCI: DES to prox RCA // Echo 01/2018: EF 50-55; prob inf-lat and inf HK   Chronic diastolic heart failure    Echocardiogram 07/2018: EF 55-60, grade 1 diastolic dysfunction, normal wall motion, normal GLS (-22.3), PASP 28   Chronic foot pain    bunions, torn ligaments-on chronic pain medication   CTS (carpal tunnel syndrome) 06/08/2014   Depression    High cholesterol    Hypertension    MDD (major depressive disorder) 10/02/2017   Migraine    Myocardial infarction 02/01/2018   Pneumonia 1986   left lung   RA (rheumatoid arthritis)     Family History  Problem Relation Age of Onset   Thyroid disease Mother    Breast cancer Mother     Parkinson's disease Father    Raynaud syndrome Maternal Aunt    Arthritis Maternal Grandmother    Heart attack Maternal Grandmother    Stroke Paternal Grandmother    Heart attack Paternal Grandfather     Past Surgical History:  Procedure Laterality Date   BTL  2000   CARPAL TUNNEL RELEASE Left    CESAREAN SECTION  1978. 1983   CORONARY STENT INTERVENTION N/A 01/27/2018   Procedure: CORONARY STENT INTERVENTION;  Surgeon: Kathleene Hazel, MD;  Location: [facility];  Service: Cardiovascular;  Laterality: N/A;   eye implants     FOOT SURGERY Left 2008   front teeth removed after injury     KNEE ARTHROSCOPY WITH LATERAL MENISECTOMY Left 02/02/2019   Procedure: LEFT KNEE ARTHROSCOPY, MEDIAL AND LATERAL MENISECTOMY, EXCISION OF LEFT LATERAL MENISCAL CYST;  Surgeon: Valeria Batman, MD;  Location: [facility];  Service:  Orthopedics;  Laterality: Left;   LEFT HEART CATH AND CORONARY ANGIOGRAPHY N/A 01/27/2018   Procedure: LEFT HEART CATH AND CORONARY ANGIOGRAPHY;  Surgeon: Kathleene Hazel, MD;  Location: [facility];  Service: Cardiovascular;  Laterality: N/A;   pre cancerous lesion removed from forehead     TOTAL ABDOMINAL HYSTERECTOMY W/ BILATERAL SALPINGOOPHORECTOMY  2002   Dr. Jackelyn Knife   VAGINAL HYSTERECTOMY  2001   Social History   Occupational History   Occupation: Retired   Tobacco Use   Smoking status: Every Day    Packs/day: 0.20    Years: 20.00    Additional pack years: 0.00    Total pack years: 4.00    Types: Cigarettes    Start date: 1972    Passive exposure: Never   Smokeless tobacco: Never  Vaping Use   Vaping Use: Never used  Substance and Sexual Activity   Alcohol use: No    Alcohol/week: 0.0 standard drinks of alcohol   Drug use: No   Sexual activity: Not on file

## 2022-06-16 MED ORDER — TRIAMCINOLONE ACETONIDE 40 MG/ML IJ SUSP
80.0000 mg | INTRAMUSCULAR | Status: AC | PRN
Start: 2022-06-12 — End: 2022-06-12
  Administered 2022-06-12: 80 mg via INTRA_ARTICULAR

## 2022-06-16 MED ORDER — LIDOCAINE HCL 1 % IJ SOLN
3.0000 mL | INTRAMUSCULAR | Status: AC | PRN
Start: 2022-06-12 — End: 2022-06-12
  Administered 2022-06-12: 3 mL

## 2022-06-17 ENCOUNTER — Telehealth: Payer: Self-pay | Admitting: Orthopedic Surgery

## 2022-06-17 NOTE — Telephone Encounter (Signed)
Patient called today wanting to discuss the possibility of having left knee surgery and sending request for clearance to her cardiologist Dietrich Pates. Please provide surgery sheet.

## 2022-06-18 NOTE — Telephone Encounter (Signed)
Done thx

## 2022-06-20 DIAGNOSIS — F411 Generalized anxiety disorder: Secondary | ICD-10-CM | POA: Diagnosis not present

## 2022-06-20 DIAGNOSIS — F1320 Sedative, hypnotic or anxiolytic dependence, uncomplicated: Secondary | ICD-10-CM | POA: Diagnosis not present

## 2022-06-20 DIAGNOSIS — F1121 Opioid dependence, in remission: Secondary | ICD-10-CM | POA: Diagnosis not present

## 2022-06-20 DIAGNOSIS — F332 Major depressive disorder, recurrent severe without psychotic features: Secondary | ICD-10-CM | POA: Diagnosis not present

## 2022-06-25 DIAGNOSIS — F332 Major depressive disorder, recurrent severe without psychotic features: Secondary | ICD-10-CM | POA: Diagnosis not present

## 2022-06-25 DIAGNOSIS — F411 Generalized anxiety disorder: Secondary | ICD-10-CM | POA: Diagnosis not present

## 2022-06-25 DIAGNOSIS — F1320 Sedative, hypnotic or anxiolytic dependence, uncomplicated: Secondary | ICD-10-CM | POA: Diagnosis not present

## 2022-06-25 DIAGNOSIS — F1121 Opioid dependence, in remission: Secondary | ICD-10-CM | POA: Diagnosis not present

## 2022-07-01 ENCOUNTER — Other Ambulatory Visit: Payer: Medicare PPO

## 2022-07-04 DIAGNOSIS — I5032 Chronic diastolic (congestive) heart failure: Secondary | ICD-10-CM | POA: Diagnosis not present

## 2022-07-09 DIAGNOSIS — M1712 Unilateral primary osteoarthritis, left knee: Secondary | ICD-10-CM | POA: Diagnosis not present

## 2022-07-16 ENCOUNTER — Other Ambulatory Visit: Payer: Self-pay | Admitting: Internal Medicine

## 2022-07-22 ENCOUNTER — Other Ambulatory Visit: Payer: Self-pay | Admitting: Internal Medicine

## 2022-07-24 DIAGNOSIS — R600 Localized edema: Secondary | ICD-10-CM | POA: Diagnosis not present

## 2022-07-24 DIAGNOSIS — Z79620 Long term (current) use of immunosuppressive biologic: Secondary | ICD-10-CM | POA: Diagnosis not present

## 2022-07-24 DIAGNOSIS — Z9989 Dependence on other enabling machines and devices: Secondary | ICD-10-CM | POA: Diagnosis not present

## 2022-07-24 DIAGNOSIS — F331 Major depressive disorder, recurrent, moderate: Secondary | ICD-10-CM | POA: Diagnosis not present

## 2022-07-24 DIAGNOSIS — D84821 Immunodeficiency due to drugs: Secondary | ICD-10-CM | POA: Diagnosis not present

## 2022-07-24 DIAGNOSIS — I25119 Atherosclerotic heart disease of native coronary artery with unspecified angina pectoris: Secondary | ICD-10-CM | POA: Diagnosis not present

## 2022-07-24 DIAGNOSIS — R2689 Other abnormalities of gait and mobility: Secondary | ICD-10-CM | POA: Diagnosis not present

## 2022-07-24 DIAGNOSIS — L03119 Cellulitis of unspecified part of limb: Secondary | ICD-10-CM | POA: Diagnosis not present

## 2022-07-24 DIAGNOSIS — M069 Rheumatoid arthritis, unspecified: Secondary | ICD-10-CM | POA: Diagnosis not present

## 2022-07-26 ENCOUNTER — Telehealth: Payer: Self-pay

## 2022-07-26 NOTE — Telephone Encounter (Signed)
Patient contacted the office stating she has Arthritis and that her left knee is swelling up a lot. Patient states Dr. Dimple Casey usually drains the knee at her appointments. Patient is schedule for an appointment on 09/11/2022. Patient states she would like to know if it will be okay to move her appointment up to drain her left knee or if it is too soon and she should keep her original appointment. Please advise.

## 2022-07-30 ENCOUNTER — Encounter: Payer: Self-pay | Admitting: Internal Medicine

## 2022-07-30 ENCOUNTER — Ambulatory Visit: Payer: Medicare PPO | Attending: Internal Medicine | Admitting: Internal Medicine

## 2022-07-30 VITALS — BP 115/62 | HR 69 | Resp 12 | Ht 64.0 in | Wt 157.0 lb

## 2022-07-30 DIAGNOSIS — M069 Rheumatoid arthritis, unspecified: Secondary | ICD-10-CM

## 2022-07-30 DIAGNOSIS — M1712 Unilateral primary osteoarthritis, left knee: Secondary | ICD-10-CM

## 2022-07-30 MED ORDER — LIDOCAINE HCL 1 % IJ SOLN
3.0000 mL | INTRAMUSCULAR | Status: AC | PRN
Start: 2022-07-30 — End: 2022-07-30
  Administered 2022-07-30: 3 mL

## 2022-07-30 NOTE — Telephone Encounter (Signed)
Saw her in clinic today 6/4 for this with aspiration performed.

## 2022-07-30 NOTE — Progress Notes (Signed)
Office Visit Note  Patient: Meghan Schwartz             Date of Birth: 1950-03-17           MRN: [id number]             PCP: Pcp, No Referring: No ref. provider found Visit Date: 07/30/2022  Subjective:  History of Present Illness: Meghan Schwartz is a 72 y.o. female with rheumatoid arthritis on Enbrel 50 mg subcu weekly and osteoarthritis here for left knee joint pain and swelling.  At her last visit she felt there was more improvement in her knee pain with the Baker's cyst aspiration as well as joint injection versus previous aspiration injections at only the suprapatellar pouch.  Subsequently saw Dr. August Saucer for evaluation of her severe primary osteoarthritis of the knee and also underwent joint aspiration that time with 45 cc fluid removed.  He discussed options for her realistically the only available procedure would be total knee arthroplasty.  She is hesitant about whether or not to pursue this due to associated pain and required rehabilitation and also cardiovascular risk.  Though she was cleared by her cardiologist to proceed with this if she wishes.  Currently has back due to increasing pain and swelling in the knee again and she is interested whether the popliteal cyst could be aspirated or injected again.  She is not noticing increased pain or swelling in other joints besides the knee.  No interruption or change to her Enbrel or other medications. No new infections or injuries.  Previous HPI 06/12/22 Meghan Schwartz is a 72 y.o. female here for follow up for RA on Enbrel 50 mg [place] weekly and osteoarthritis with chronic left knee pain and swelling. We aspirated the left knee in March but she saw quick re accumulation of fluid.  Outside of left knee has not experienced much exacerbation of upper extremity pain.  Also describes some clicking or popping sensation frequently occurring and with decreased steadiness on her feet.  She is usually able to walk adequately with some form of assistive device at  least a cane.   Previous HPI 05/06/22 Meghan Schwartz is a 72 y.o. female here for follow up for RA on Enbrel 50 mg subcu weekly and osteoarthritis.  Since her last visit she had an issue with worsening pedal edema and a very large associated weight gain.  She was referred to see Dr. Tenny Craw with cardiology.  She was prescribed metolazone to combine with her torsemide and saw a rapid diuresis with 15 pound weight loss in 1 day.  Initially had associated skin peeling since then still has erythema and rough or thick skin over the affected areas but without any reaccumulation of the weight and pitting fluid.  Blood pressure has been somewhat low.  She has had some falls due to instability she is noticing trouble where she cannot apply weight onto her right foot evenly just gets pressure around the base of the second and third toe.  And left knee is causing more problems with reaccumulation of the large effusion and also with posterior swelling from baker's cyst.     Previous HPI 02/05/21 Meghan Schwartz is a 72 y.o. female her for rheumatoid arthritis on Enbrel 50 mg [place] weekly for which she has been seeing Dr. Nickola Major previously. She was originally diagnosed about 10 or 11 years ago due to development of joint pain, stiffness, synovitis, and progressive joint deformities primarily starting in the feet  and knees.  Subsequently proceeded to have increased joint pains involving bilateral hands with some swelling at the PIP and MCP joints.  She started treatment with Enbrel injections with some improvement in the lower extremity joint pains she recalls still having a lot of pain symptoms and pronounced fatigue that did not clear with this treatment.  Combination treatment was tried with methotrexate with significant GI intolerance.  She tried leflunomide but developed substantial alopecia so discontinued the medicine.  She switched to hydroxychloroquine combination treatment which was being well-tolerated but she has had  multiple new or worsening medical problems over the past few years with concern of medication related effects. She had hand numbness in the past improved after left carpal tunnel release surgery but she has some residual muscle atrophy in thenar prominence. She had left knee meniscectomy in 2020. She suffered NSTEMI in 01/2018 with DES placement in RCA for 99% blockage and has chronic diastolic CHF. Her edema is generally controlled while taking torsemide but does rapidly re accumulate fluid off this medication. Ophthalmology evaluation indicated significant visual field deficits findings consistent with age-related macular degeneration of both eyes and recommended avoidance of this medication.  She also has considerable osteoarthritis especially in her knees and feet previous surgical reconstruction on the left foot. Overall she feels her gait is not very stable due to these problems.   DMARD Hx Enbrel - current   HCQ - macular disease? LEF - alopecia MTX - GI intolerance.    Activities of Daily Living:  Patient reports morning stiffness for 30 minutes.   Patient Reports nocturnal pain.  Difficulty dressing/grooming: Denies Difficulty climbing stairs: Denies Difficulty getting out of chair: Denies Difficulty using hands for taps, buttons, cutlery, and/or writing: Reports  Review of Systems  Constitutional:  Positive for fatigue.  HENT:  Positive for mouth sores and mouth dryness.   Eyes:  Positive for dryness.  Respiratory:  Positive for shortness of breath.   Cardiovascular:  Negative for chest pain and palpitations.  Gastrointestinal:  Negative for blood in stool, constipation and diarrhea.  Endocrine: Positive for increased urination.  Genitourinary:  Positive for involuntary urination.  Musculoskeletal:  Positive for joint pain, gait problem, joint pain, joint swelling, myalgias, muscle weakness, morning stiffness and myalgias. Negative for muscle tenderness.  Skin:  Positive for  color change and sensitivity to sunlight. Negative for rash and hair loss.  Allergic/Immunologic: Negative for susceptible to infections.  Neurological:  Positive for dizziness and headaches.  Hematological:  Negative for swollen glands.  Psychiatric/Behavioral:  Positive for depressed mood. Negative for sleep disturbance. The patient is nervous/anxious.     PMFS History:  Patient Active Problem List   Diagnosis Date Noted   Effusion, left knee 06/12/2022   Leg edema, left 12/25/2021   Claw toe, left 08/07/2021   Dog bite 03/05/2021   High risk medication use 02/05/2021   Dry mouth 02/05/2021   Pain in left foot 01/31/2021   Bunion of great toe of right foot 06/15/2020   Hypoxia 03/05/2019   Derangement of posterior horn of medial meniscus 02/02/2019   Derangement of posterior horn of lateral meniscus 02/02/2019   Meniscal cyst, unspecified laterality 12/01/2018   Torn medial meniscus 12/01/2018   Primary osteoarthritis of left knee 12/01/2018   Coronary artery disease involving native coronary artery of native heart without angina pectoris 09/14/2018   (HFpEF) heart failure with preserved ejection fraction (HCC)    Chronic pain of left knee 05/15/2018   Mass of left knee  05/15/2018   Non-ST elevation (NSTEMI) myocardial infarction [facility])    Chest pain 01/25/2018   RA (rheumatoid arthritis) (HCC) 01/25/2018   Hyperlipidemia 01/25/2018   Hypokalemia 01/25/2018   Depression with anxiety 01/25/2018   Tobacco abuse 01/25/2018   Lower extremity cellulitis 01/25/2018   Benzodiazepine dependence (HCC)    MDD (major depressive disorder), recurrent severe, without psychosis (HCC) 10/02/2017   CTS (carpal tunnel syndrome) 06/08/2014    Past Medical History:  Diagnosis Date   Allergic rhinitis    Anxiety    Benzodiazepine dependence (HCC)    Bruxism    CAD (coronary artery disease)    S/p NSTEMI 01/2018 >> LHC: oLAD 20, pLAD 20; oD1 20, oLCx 40, pRCA 99 >> PCI: DES to prox RCA //  Echo 01/2018: EF 50-55; prob inf-lat and inf HK   Chronic diastolic heart failure    Echocardiogram 07/2018: EF 55-60, grade 1 diastolic dysfunction, normal wall motion, normal GLS (-22.3), PASP 28   Chronic foot pain    bunions, torn ligaments-on chronic pain medication   CTS (carpal tunnel syndrome) 06/08/2014   Depression    High cholesterol    Hypertension    MDD (major depressive disorder) 10/02/2017   Migraine    Myocardial infarction (HCC) 02/01/2018   Pneumonia 1986   left lung   RA (rheumatoid arthritis) (HCC)     Family History  Problem Relation Age of Onset   Thyroid disease Mother    Breast cancer Mother    Parkinson's disease Father    Raynaud syndrome Maternal Aunt    Arthritis Maternal Grandmother    Heart attack Maternal Grandmother    Stroke Paternal Grandmother    Heart attack Paternal Grandfather    Past Surgical History:  Procedure Laterality Date   BTL  2000   CARPAL TUNNEL RELEASE Left    CESAREAN SECTION  1978. 1983   CORONARY STENT INTERVENTION N/A 01/27/2018   Procedure: CORONARY STENT INTERVENTION;  Surgeon: Kathleene Hazel, MD;  Location: [facility];  Service: Cardiovascular;  Laterality: N/A;   eye implants     FOOT SURGERY Left 2008   front teeth removed after injury     KNEE ARTHROSCOPY WITH LATERAL MENISECTOMY Left 02/02/2019   Procedure: LEFT KNEE ARTHROSCOPY, MEDIAL AND LATERAL MENISECTOMY, EXCISION OF LEFT LATERAL MENISCAL CYST;  Surgeon: Valeria Batman, MD;  Location: [facility];  Service: Orthopedics;  Laterality: Left;   LEFT HEART CATH AND CORONARY ANGIOGRAPHY N/A 01/27/2018   Procedure: LEFT HEART CATH AND CORONARY ANGIOGRAPHY;  Surgeon: Kathleene Hazel, MD;  Location: [facility];  Service: Cardiovascular;  Laterality: N/A;   pre cancerous lesion removed from forehead     TOTAL ABDOMINAL HYSTERECTOMY W/ BILATERAL SALPINGOOPHORECTOMY  2002   Dr. Jackelyn Knife   VAGINAL HYSTERECTOMY  2001   Social History    Social History Narrative   Lives at home with spouse.    Right handed.   Caffeine use: 2 cups coffee/day    8 oz soda/day    Immunization History  Administered Date(s) Administered   PFIZER(Purple Top)SARS-COV-2 Vaccination 04/10/2019, 05/05/2019, 10/19/2019, 06/20/2020   Pfizer Covid-19 Vaccine Bivalent Booster 84yrs & up 11/29/2020   Tdap 04/03/2018     Objective: Vital Signs: BP 115/62 (BP Location: Left Arm, Patient Position: Sitting, Cuff Size: Normal)   Pulse 69   Resp 12   Ht 5\' 4"  (1.626 m)   Wt 157 lb (71.2 kg)   BMI 26.95 kg/m    Physical  Exam Cardiovascular:     Rate and Rhythm: Normal rate and regular rhythm.  Pulmonary:     Effort: Pulmonary effort is normal.     Breath sounds: Normal breath sounds.  Skin:    General: Skin is warm and dry.     Comments: Trace pedal edema bilaterally Lidodermatosclerosis changes in both legs with mild erythema on the medial aspect part way up to the knee      Musculoskeletal Exam:  Left knee crepitus, joint line tenderness to pressure, swelling palpable at suprapatellar pouch and popliteal cyst, focal area of greatest tenderness is at posterior near midline adjacent to popliteal cyst  Investigation: No additional findings.  Imaging: No results found.  Recent Labs: Lab Results  Component Value Date   WBC 3.9 06/12/2022   HGB 11.3 (L) 06/12/2022   PLT 190 06/12/2022   NA 142 06/12/2022   K 3.6 06/12/2022   CL 100 06/12/2022   CO2 34 (H) 06/12/2022   GLUCOSE 100 (H) 06/12/2022   BUN 21 06/12/2022   CREATININE 1.08 (H) 06/12/2022   BILITOT 0.6 06/12/2022   ALKPHOS 113 12/25/2021   AST 19 06/12/2022   ALT 13 06/12/2022   PROT 6.6 06/12/2022   ALBUMIN 4.2 12/25/2021   CALCIUM 9.3 06/12/2022   GFRAA 65 02/04/2020   QFTBGOLDPLUS NEGATIVE 06/12/2022    Speciality Comments: No specialty comments available.  Procedures:  Large Joint Inj: L knee on 07/30/2022 3:00 PM Indications: pain and joint  swelling Details: 22 G 1.5 in needle, superolateral approach Medications: 3 mL lidocaine 1 % Aspirate: 55 mL clear, yellow and blood-tinged Outcome: tolerated well, no immediate complications Procedure, treatment alternatives, risks and benefits explained, specific risks discussed. Consent was given by the patient. Immediately prior to procedure a time out was called to verify the correct patient, procedure, equipment, support staff and site/side marked as required. Patient was prepped and draped in the usual sterile fashion.     Allergies: Erythromycin   Assessment / Plan:     Visit Diagnoses: Primary osteoarthritis of left knee  Knee pain and swelling consistent with her severe primary osteoarthritis.  I discussed it would not be recommended to repeat intra-articular steroid injection sooner than 3 months when repeating the procedure over time due to increased risk of complication.  This was last in April. Repeated aspiration from both posterior and superolateral approaches 6mL removed from popliteal cyst and 49mL from suprapatellar pouch.  Discussed we could prescribe a short oral prednisone taper if symptoms are not relieved with aspiration alone but too soon for repeat local steroid injection.  Rheumatoid arthritis, involving unspecified site, unspecified whether rheumatoid factor present (HCC)  I do not think current symptoms represent any flare of rheumatoid arthritis.  Continuing current treatment.   Orders: Orders Placed This Encounter  Procedures   Large Joint Inj   No orders of the defined types were placed in this encounter.    Follow-Up Instructions: No follow-ups on file.   Fuller Plan, MD  Note - This record has been created using [employer].  Chart creation errors have been sought, but may not always  have been located. Such creation errors do not reflect on  the standard of medical care.

## 2022-08-01 ENCOUNTER — Other Ambulatory Visit: Payer: Self-pay | Admitting: Family Medicine

## 2022-08-01 DIAGNOSIS — M85859 Other specified disorders of bone density and structure, unspecified thigh: Secondary | ICD-10-CM

## 2022-08-01 DIAGNOSIS — M8580 Other specified disorders of bone density and structure, unspecified site: Secondary | ICD-10-CM

## 2022-08-02 ENCOUNTER — Telehealth: Payer: Self-pay | Admitting: Podiatry

## 2022-08-02 NOTE — Telephone Encounter (Signed)
Pt calling to check status of her orthotics that were ordered about a month ago.

## 2022-08-08 DIAGNOSIS — Z79899 Other long term (current) drug therapy: Secondary | ICD-10-CM | POA: Diagnosis not present

## 2022-08-08 DIAGNOSIS — H353221 Exudative age-related macular degeneration, left eye, with active choroidal neovascularization: Secondary | ICD-10-CM | POA: Diagnosis not present

## 2022-08-08 DIAGNOSIS — H353212 Exudative age-related macular degeneration, right eye, with inactive choroidal neovascularization: Secondary | ICD-10-CM | POA: Diagnosis not present

## 2022-08-08 DIAGNOSIS — H43393 Other vitreous opacities, bilateral: Secondary | ICD-10-CM | POA: Diagnosis not present

## 2022-08-08 DIAGNOSIS — H43813 Vitreous degeneration, bilateral: Secondary | ICD-10-CM | POA: Diagnosis not present

## 2022-08-20 ENCOUNTER — Emergency Department (HOSPITAL_BASED_OUTPATIENT_CLINIC_OR_DEPARTMENT_OTHER): Payer: Medicare PPO

## 2022-08-20 ENCOUNTER — Ambulatory Visit: Payer: Medicare PPO | Attending: Family Medicine | Admitting: Rehabilitative and Restorative Service Providers"

## 2022-08-20 ENCOUNTER — Observation Stay (HOSPITAL_BASED_OUTPATIENT_CLINIC_OR_DEPARTMENT_OTHER)
Admission: EM | Admit: 2022-08-20 | Discharge: 2022-08-23 | Disposition: A | Discharge status: Home / Self Care | Payer: Medicare PPO | Attending: Internal Medicine | Admitting: Internal Medicine

## 2022-08-20 ENCOUNTER — Encounter (HOSPITAL_BASED_OUTPATIENT_CLINIC_OR_DEPARTMENT_OTHER): Payer: Self-pay

## 2022-08-20 ENCOUNTER — Other Ambulatory Visit: Payer: Self-pay

## 2022-08-20 DIAGNOSIS — R109 Unspecified abdominal pain: Secondary | ICD-10-CM | POA: Insufficient documentation

## 2022-08-20 DIAGNOSIS — E876 Hypokalemia: Principal | ICD-10-CM | POA: Diagnosis present

## 2022-08-20 DIAGNOSIS — G9341 Metabolic encephalopathy: Secondary | ICD-10-CM | POA: Diagnosis present

## 2022-08-20 DIAGNOSIS — Z1152 Encounter for screening for COVID-19: Secondary | ICD-10-CM | POA: Insufficient documentation

## 2022-08-20 DIAGNOSIS — I700 Atherosclerosis of aorta: Secondary | ICD-10-CM | POA: Diagnosis not present

## 2022-08-20 DIAGNOSIS — I2510 Atherosclerotic heart disease of native coronary artery without angina pectoris: Secondary | ICD-10-CM | POA: Diagnosis not present

## 2022-08-20 DIAGNOSIS — R0602 Shortness of breath: Secondary | ICD-10-CM | POA: Insufficient documentation

## 2022-08-20 DIAGNOSIS — N179 Acute kidney failure, unspecified: Secondary | ICD-10-CM | POA: Diagnosis present

## 2022-08-20 DIAGNOSIS — S199XXA Unspecified injury of neck, initial encounter: Secondary | ICD-10-CM | POA: Diagnosis not present

## 2022-08-20 DIAGNOSIS — K8020 Calculus of gallbladder without cholecystitis without obstruction: Secondary | ICD-10-CM | POA: Insufficient documentation

## 2022-08-20 DIAGNOSIS — E785 Hyperlipidemia, unspecified: Secondary | ICD-10-CM | POA: Diagnosis present

## 2022-08-20 DIAGNOSIS — R2689 Other abnormalities of gait and mobility: Secondary | ICD-10-CM | POA: Insufficient documentation

## 2022-08-20 DIAGNOSIS — G8929 Other chronic pain: Secondary | ICD-10-CM | POA: Diagnosis present

## 2022-08-20 DIAGNOSIS — K381 Appendicular concretions: Secondary | ICD-10-CM | POA: Insufficient documentation

## 2022-08-20 DIAGNOSIS — R531 Weakness: Secondary | ICD-10-CM | POA: Insufficient documentation

## 2022-08-20 DIAGNOSIS — R9431 Abnormal electrocardiogram [ECG] [EKG]: Secondary | ICD-10-CM | POA: Diagnosis present

## 2022-08-20 DIAGNOSIS — R911 Solitary pulmonary nodule: Secondary | ICD-10-CM | POA: Diagnosis not present

## 2022-08-20 DIAGNOSIS — Z79899 Other long term (current) drug therapy: Secondary | ICD-10-CM | POA: Diagnosis not present

## 2022-08-20 DIAGNOSIS — W19XXXA Unspecified fall, initial encounter: Secondary | ICD-10-CM | POA: Diagnosis present

## 2022-08-20 DIAGNOSIS — I10 Essential (primary) hypertension: Secondary | ICD-10-CM | POA: Diagnosis present

## 2022-08-20 DIAGNOSIS — F418 Other specified anxiety disorders: Secondary | ICD-10-CM | POA: Diagnosis present

## 2022-08-20 DIAGNOSIS — F17210 Nicotine dependence, cigarettes, uncomplicated: Secondary | ICD-10-CM | POA: Insufficient documentation

## 2022-08-20 DIAGNOSIS — Z720 Tobacco use: Secondary | ICD-10-CM | POA: Diagnosis present

## 2022-08-20 DIAGNOSIS — I110 Hypertensive heart disease with heart failure: Secondary | ICD-10-CM | POA: Diagnosis not present

## 2022-08-20 DIAGNOSIS — Z7902 Long term (current) use of antithrombotics/antiplatelets: Secondary | ICD-10-CM | POA: Diagnosis not present

## 2022-08-20 DIAGNOSIS — S0990XA Unspecified injury of head, initial encounter: Secondary | ICD-10-CM | POA: Diagnosis not present

## 2022-08-20 DIAGNOSIS — J432 Centrilobular emphysema: Secondary | ICD-10-CM | POA: Diagnosis not present

## 2022-08-20 DIAGNOSIS — R4182 Altered mental status, unspecified: Secondary | ICD-10-CM | POA: Diagnosis present

## 2022-08-20 DIAGNOSIS — I5032 Chronic diastolic (congestive) heart failure: Secondary | ICD-10-CM | POA: Diagnosis present

## 2022-08-20 DIAGNOSIS — R2681 Unsteadiness on feet: Secondary | ICD-10-CM | POA: Insufficient documentation

## 2022-08-20 LAB — MAGNESIUM: Magnesium: 1.8 mg/dL (ref 1.7–2.4)

## 2022-08-20 LAB — CBC
HCT: 35.9 % — ABNORMAL LOW (ref 36.0–46.0)
Hemoglobin: 11.7 g/dL — ABNORMAL LOW (ref 12.0–15.0)
MCH: 25.8 pg — ABNORMAL LOW (ref 26.0–34.0)
MCHC: 32.6 g/dL (ref 30.0–36.0)
MCV: 79.1 fL — ABNORMAL LOW (ref 80.0–100.0)
Platelets: 162 10*3/uL (ref 150–400)
RBC: 4.54 MIL/uL (ref 3.87–5.11)
RDW: 14.1 % (ref 11.5–15.5)
WBC: 6.7 10*3/uL (ref 4.0–10.5)
nRBC: 0 % (ref 0.0–0.2)

## 2022-08-20 LAB — I-STAT VENOUS BLOOD GAS, ED
Acid-Base Excess: 14 mmol/L — ABNORMAL HIGH (ref 0.0–2.0)
Bicarbonate: 40.2 mmol/L — ABNORMAL HIGH (ref 20.0–28.0)
Calcium, Ion: 1.07 mmol/L — ABNORMAL LOW (ref 1.15–1.40)
HCT: 34 % — ABNORMAL LOW (ref 36.0–46.0)
Hemoglobin: 11.6 g/dL — ABNORMAL LOW (ref 12.0–15.0)
O2 Saturation: 96 %
Potassium: <2 mmol/L — CL (ref 3.5–5.1)
Sodium: 132 mmol/L — ABNORMAL LOW (ref 135–145)
TCO2: 42 mmol/L — ABNORMAL HIGH (ref 22–32)
pCO2, Ven: 54.4 mmHg (ref 44–60)
pH, Ven: 7.477 — ABNORMAL HIGH (ref 7.25–7.43)
pO2, Ven: 82 mmHg — ABNORMAL HIGH (ref 32–45)

## 2022-08-20 LAB — COMPREHENSIVE METABOLIC PANEL
ALT: 14 U/L (ref 0–44)
AST: 30 U/L (ref 15–41)
Albumin: 4.3 g/dL (ref 3.5–5.0)
Alkaline Phosphatase: 84 U/L (ref 38–126)
Anion gap: 13 (ref 5–15)
BUN: 30 mg/dL — ABNORMAL HIGH (ref 8–23)
CO2: 35 mmol/L — ABNORMAL HIGH (ref 22–32)
Calcium: 9.5 mg/dL (ref 8.9–10.3)
Chloride: 86 mmol/L — ABNORMAL LOW (ref 98–111)
Creatinine, Ser: 1.42 mg/dL — ABNORMAL HIGH (ref 0.44–1.00)
GFR, Estimated: 39 mL/min — ABNORMAL LOW (ref >60)
Glucose, Bld: 118 mg/dL — ABNORMAL HIGH (ref 70–99)
Potassium: 2.2 mmol/L — CL (ref 3.5–5.1)
Sodium: 134 mmol/L — ABNORMAL LOW (ref 135–145)
Total Bilirubin: 1.1 mg/dL (ref 0.3–1.2)
Total Protein: 7.7 g/dL (ref 6.5–8.1)

## 2022-08-20 LAB — URINALYSIS, ROUTINE W REFLEX MICROSCOPIC
Bacteria, UA: NONE SEEN
Bilirubin Urine: NEGATIVE
Glucose, UA: NEGATIVE mg/dL
Ketones, ur: NEGATIVE mg/dL
Leukocytes,Ua: NEGATIVE
Nitrite: NEGATIVE
Specific Gravity, Urine: 1.02 (ref 1.005–1.030)
pH: 7 (ref 5.0–8.0)

## 2022-08-20 LAB — LACTIC ACID, PLASMA: Lactic Acid, Venous: 1.6 mmol/L (ref 0.5–1.9)

## 2022-08-20 LAB — RESP PANEL BY RT-PCR (RSV, FLU A&B, COVID)  RVPGX2
Influenza A by PCR: NEGATIVE
Influenza B by PCR: NEGATIVE
Resp Syncytial Virus by PCR: NEGATIVE
SARS Coronavirus 2 by RT PCR: NEGATIVE

## 2022-08-20 LAB — SALICYLATE LEVEL: Salicylate Lvl: <7 mg/dL — ABNORMAL LOW (ref 7.0–30.0)

## 2022-08-20 LAB — ACETAMINOPHEN LEVEL: Acetaminophen (Tylenol), Serum: <10 ug/mL — ABNORMAL LOW (ref 10–30)

## 2022-08-20 LAB — TROPONIN I (HIGH SENSITIVITY): Troponin I (High Sensitivity): 15 ng/L (ref <18)

## 2022-08-20 LAB — AMMONIA: Ammonia: 16 umol/L (ref 9–35)

## 2022-08-20 LAB — CBG MONITORING, ED: Glucose-Capillary: 126 mg/dL — ABNORMAL HIGH (ref 70–99)

## 2022-08-20 LAB — ETHANOL: Alcohol, Ethyl (B): <10 mg/dL (ref <10)

## 2022-08-20 MED ORDER — MAGNESIUM SULFATE 2 GM/50ML IV SOLN
2.0000 g | Freq: Once | INTRAVENOUS | Status: AC
  Administered 2022-08-21: 2 g via INTRAVENOUS
  Filled 2022-08-20: qty 50

## 2022-08-20 MED ORDER — IOHEXOL 350 MG/ML SOLN
100.0000 mL | Freq: Once | INTRAVENOUS | Status: AC | PRN
  Administered 2022-08-20: 80 mL via INTRAVENOUS

## 2022-08-20 MED ORDER — POTASSIUM CHLORIDE 10 MEQ/100ML IV SOLN
10.0000 meq | INTRAVENOUS | Status: AC
  Administered 2022-08-20 – 2022-08-21 (×5): 10 meq via INTRAVENOUS
  Filled 2022-08-20: qty 100

## 2022-08-20 MED ORDER — POTASSIUM CHLORIDE CRYS ER 20 MEQ PO TBCR: 40.0000 meq | EXTENDED_RELEASE_TABLET | ORAL | Status: DC

## 2022-08-20 NOTE — Progress Notes (Signed)
  Patient Name: Meghan Schwartz, Meghan Schwartz DOB: 1950-05-25 MRN: [id number] Transferring facility: [facility] Requesting provider: Karie Mainland, [place] Reason for transfer: hypokalemia 72 yo WF with weakness, tremors, confusion since 12 pm. hx of COPD on nocturnal O2, hypokalemia 2.2. on demadex at home.  EDP ordered IV KCL run. advised EDP to give 40 meq po KCL q4h x 2 doses. repeat BMP at 5 AM. if pt's serum K >3 before transfer, she can be discharged to home.  EKG shows prolonged QTc of 578 msec Going to: [facility] Admission Status: obs Bed Type: cardiac telemetry To Do:  [facility] will assume care on arrival to accepting facility. Until arrival, medical decision making responsibilities remain with the EDP.  However, [facility] available 24/7 for questions and assistance.   Nursing staff please page [facility] Admits and Consults [phone]) as soon as the patient arrives to the hospital.  Carollee Herter, DO [facility]

## 2022-08-20 NOTE — ED Notes (Addendum)
Patient placed on 3L [place] in triage d/t SpO2 84% on Room Air. Per pt family, patient wears oxygen at nighttime and while napping during the day.

## 2022-08-20 NOTE — ED Triage Notes (Addendum)
Patient here POV from Home.  Noted by Husband to be confused since 1300. Some N/V as well. Felt Warm to touch but no confirmed Fevers. Some ABD Discomfort as well. No Discernable Cough. Some Bilateral Leg Numbness today as well. Generalized Weakness.   NAD Noted during Triage. BIB Wheelchair.

## 2022-08-20 NOTE — ED Provider Notes (Signed)
EMERGENCY DEPARTMENT AT [facility] Provider Note   CSN: [id number] Arrival date & time: 08/20/22  2106     History  Chief Complaint  Patient presents with   Altered Mental Status    Meghan Schwartz is a 72 y.o. female.  72 year old female with extensive cardiac disease presents today for concern of altered mental status, weakness, tremors since around noon.  Husband accompanies patient at bedside.  She does complain of shortness of breath that started around the same time.  Some abdominal pain with nausea but no vomiting.  Denies dysuria, cough, or fever.  Husband states that patient also had 2 falls.  1 in the bathroom, and the other in the bedroom.  Husband believes that on one of the falls patient may have hit her head.  Patient states she has not had a bowel movement in the past 2 days.  Typically has a bowel movement daily.  The history is provided by the patient. No language interpreter was used.       Home Medications Prior to Admission medications   Medication Sig Start Date End Date Taking? Authorizing Provider  acetaminophen (TYLENOL) 500 MG tablet Take 500 mg by mouth every 6 (six) hours as needed (For knee pain).    [provider]  Cetirizine HCl (ZYRTEC PO) Take 1 capsule by mouth daily.    [provider]  cholecalciferol (VITAMIN D3) 25 MCG (1000 UT) tablet Take 5,000 Units by mouth daily. Patient states she takes 5 a day.    [provider]  clonazePAM (KLONOPIN) 1 MG tablet Take 0.5-1 mg by mouth See admin instructions. 0.5 mg midday, 1 mg at bedtime 12/31/17   [provider]  cloNIDine (CATAPRES) 0.1 MG tablet  10/07/17   [provider]  clopidogrel (PLAVIX) 75 MG tablet TAKE 1 TABLET BY MOUTH EVERY DAY 07/23/22   Pricilla Riffle, MD  escitalopram (LEXAPRO) 10 MG tablet 10 mg. Take 1 1/2 tablets by mouth daily    [provider]  estradiol (VIVELLE-DOT) 0.075 MG/24HR Place 1 patch onto the skin 2  (two) times a week. Tuesday and Friday    [provider]  etanercept (ENBREL SURECLICK) 50 MG/ML injection Inject 50 mg into the skin once a week. 12/06/21   Fuller Plan, MD  furosemide (LASIX) 20 MG tablet  03/05/18   [provider]  GABAPENTIN, ONCE-DAILY, PO Take by mouth. Started 06/06/2022. Patient taking for ten days.    [provider]  HORIZANT 600 MG TBCR Oral for 30 Days    [provider]  hydrOXYzine (ATARAX) 25 MG tablet Take 25-50 mg by mouth 2 (two) times daily as needed. Patient not taking: Reported on 07/30/2022 02/24/21   [provider]  ibuprofen (ADVIL) 200 MG tablet Take by mouth. 05/05/18   [provider]  KLOR-CON M20 20 MEQ tablet TAKE 1 TABLET BY MOUTH 3 TIMES DAILY. 07/16/22   Pricilla Riffle, MD  meloxicam [facility]) 15 MG tablet     [provider]  metolazone (ZAROXOLYN) 2.5 MG tablet Take one tablet (2.5 mg) by mouth twice a week 30 minutes prior to taking your Torsemide. 04/15/22   Pricilla Riffle, MD  metoprolol tartrate (LOPRESSOR) 25 MG tablet TAKE 1 TABLET BY MOUTH EVERY DAY 06/04/22   Pricilla Riffle, MD  Multiple Vitamins-Minerals (MULTIVITAMIN WITH MINERALS) tablet Take 1 tablet by mouth daily.    [provider]  nitroGLYCERIN (NITROSTAT) 0.4 MG SL tablet  Place 1 tablet (0.4 mg total) under the tongue every 5 (five) minutes as needed for chest pain. 01/28/18   Rai, Ripudeep K, MD  ondansetron (ZOFRAN-ODT) 4 MG disintegrating tablet Take 4 mg by mouth as needed. 02/23/21   [provider]  PRESCRIPTION MEDICATION Apply 1 application onto the skin and legs for scar (UREA Cream)    [provider]  rosuvastatin (CRESTOR) 40 MG tablet TAKE 1 TABLET BY MOUTH EVERY DAY 06/04/22   Pricilla Riffle, MD  sulfamethoxazole-trimethoprim (BACTRIM DS) 800-160 MG tablet Take 1 tablet by mouth 2 (two) times daily. Patient not taking: Reported on 06/12/2022    [provider]  ticagrelor  (BRILINTA) 90 MG TABS tablet Brilinta 90 mg tablet TAKE 1 TABLET BY MOUTH TWICE DAILY Patient not taking: Reported on 07/30/2022 01/28/18   [provider]  torsemide (DEMADEX) 20 MG tablet Take 2 tablets (40 mg total) by mouth daily. 12/31/21   Tereso Newcomer T, PA-C  triamcinolone cream (KENALOG) 0.1 % Apply topically 2 (two) times daily. Patient not taking: Reported on 07/30/2022 12/26/20   [provider]  ZUBSOLV 5.7-1.4 MG SUBL Place 1 tablet under the tongue 3 (three) times daily. 12/30/18   [provider]      Allergies    Erythromycin    Review of Systems   Review of Systems  Constitutional:  Negative for fever.  Respiratory:  Positive for shortness of breath. Negative for cough.   Cardiovascular:  Negative for chest pain, palpitations and leg swelling.  Gastrointestinal:  Positive for abdominal pain, constipation and nausea.  All other systems reviewed and are negative.   Physical Exam Updated Vital Signs BP 124/66   Pulse 89   Temp 98.6 F (37 C)   Resp 16   Ht 5\' 4"  (1.626 m)   Wt 70.3 kg   SpO2 97%   BMI 26.61 kg/m  Physical Exam Vitals and nursing note reviewed.  Constitutional:      General: She is not in acute distress.    Appearance: Normal appearance. She is ill-appearing (Chronically ill appearing).  HENT:     Head: Normocephalic and atraumatic.     Nose: Nose normal.  Eyes:     General: No scleral icterus.    Extraocular Movements: Extraocular movements intact.     Conjunctiva/sclera: Conjunctivae normal.  Cardiovascular:     Rate and Rhythm: Normal rate and regular rhythm.     Heart sounds: Normal heart sounds.  Pulmonary:     Effort: Pulmonary effort is normal. No respiratory distress.     Breath sounds: No wheezing or rales.  Abdominal:     General: There is no distension.     Palpations: Abdomen is soft.     Tenderness: There is no abdominal tenderness. There is no guarding.  Musculoskeletal:        General: Normal  range of motion.     Cervical back: Normal range of motion.     Right lower leg: No edema.     Left lower leg: No edema.  Skin:    General: Skin is warm and dry.  Neurological:     General: No focal deficit present.     Mental Status: She is alert. Mental status is at baseline.     ED Results / Procedures / Treatments   Labs (all labs ordered are listed, but only abnormal results are displayed) Labs Reviewed  COMPREHENSIVE METABOLIC PANEL - Abnormal; Notable for the following components:  Result Value   Sodium 134 (*)    Potassium 2.2 (*)    Chloride 86 (*)    CO2 35 (*)    Glucose, Bld 118 (*)    BUN 30 (*)    Creatinine, Ser 1.42 (*)    GFR, Estimated 39 (*)    All other components within normal limits  CBC - Abnormal; Notable for the following components:   Hemoglobin 11.7 (*)    HCT 35.9 (*)    MCV 79.1 (*)    MCH 25.8 (*)    All other components within normal limits  CBG MONITORING, ED - Abnormal; Notable for the following components:   Glucose-Capillary 126 (*)    All other components within normal limits  CULTURE, BLOOD (ROUTINE X 2)  CULTURE, BLOOD (ROUTINE X 2)  RESP PANEL BY RT-PCR (RSV, FLU A&B, COVID)  RVPGX2  ACETAMINOPHEN LEVEL  AMMONIA  ETHANOL  LACTIC ACID, PLASMA  LACTIC ACID, PLASMA  MAGNESIUM  SALICYLATE LEVEL  URINALYSIS, ROUTINE W REFLEX MICROSCOPIC  I-STAT ARTERIAL BLOOD GAS, ED    EKG None  Radiology No results found.  Procedures .Critical Care  Performed by: Marita Kansas, PA-C Authorized by: Marita Kansas, PA-C   Critical care provider statement:    Critical care time (minutes):  30   Critical care was necessary to treat or prevent imminent or life-threatening deterioration of the following conditions: hypokalemia.   Critical care was time spent personally by me on the following activities:  Development of treatment plan with patient or surrogate, discussions with consultants, evaluation of patient's response to treatment,  examination of patient, ordering and review of laboratory studies, ordering and review of radiographic studies, ordering and performing treatments and interventions, pulse oximetry, re-evaluation of patient's condition and review of old charts     Medications Ordered in ED Medications  potassium chloride 10 mEq in 100 mL IVPB (has no administration in time range)    ED Course/ Medical Decision Making/ A&P                             Medical Decision Making Amount and/or Complexity of Data Reviewed Labs: ordered. Radiology: ordered.  Risk Prescription drug management.   Medical Decision Making / ED Course   This patient presents to the ED for concern of altered mental status, falls, hypoxia, this involves an extensive number of treatment options, and is a complaint that carries with it a high risk of complications and morbidity.  The differential diagnosis includes PE, head injury, viral URI, CHF exacerbation, ACS  MDM: 72 year old female presents today for concern of altered mental status, weakness, and hypoxia.  Patient found to be hypoxic in triage.  At 84%.  She does wear O2 at bedtime only.  Not with activity or exertion.  She is oriented.  Will obtain labs, and imaging.  CBC shows no leukocytosis.  Hemoglobin of 11.7.  CMP shows potassium of 2.2, creatinine 1.42 otherwise without acute concerns.  IV potassium repletion ordered.  Bicarb is elevated at 35.  VBG shows some alkalosis.  Initial troponin of 15.  She is without chest pain.  EKG without acute ischemic changes.   Blood cultures, UA, respiratory panel ordered.  CT angio chest PE study ordered due to hypoxia.  Abdomen pelvis CT ordered due to abdominal pain and nausea complaint that started today.  CT head and cervical spine ordered due to fall with potential head injury.  These are pending.  CT head and cervical spine without acute finding.  CT abdomen pelvis does show stool burden but no bowel obstruction or other  acute finding.  CT chest PE study without evidence of PE, or pneumonia.  Discussed with hospitalist who will accept patient for admission.  He recommends that aggressive potassium repletion be pursued in the emergency department and if patient does not get transferred in time due to extensive wait times for transfer that patient's potassium be rechecked in 4 hours and if it is above 3 but they be discharged from the emergency department.  Will notify oncoming provider.  Additional history obtained: -Additional history obtained from husband who is at bedside -External records from outside source obtained and reviewed including: Chart review including previous notes, labs, imaging, consultation notes   Lab Tests: -I ordered, reviewed, and interpreted labs.   The pertinent results include:   Labs Reviewed  COMPREHENSIVE METABOLIC PANEL - Abnormal; Notable for the following components:      Result Value   Sodium 134 (*)    Potassium 2.2 (*)    Chloride 86 (*)    CO2 35 (*)    Glucose, Bld 118 (*)    BUN 30 (*)    Creatinine, Ser 1.42 (*)    GFR, Estimated 39 (*)    All other components within normal limits  CBC - Abnormal; Notable for the following components:   Hemoglobin 11.7 (*)    HCT 35.9 (*)    MCV 79.1 (*)    MCH 25.8 (*)    All other components within normal limits  CBG MONITORING, ED - Abnormal; Notable for the following components:   Glucose-Capillary 126 (*)    All other components within normal limits  CULTURE, BLOOD (ROUTINE X 2)  CULTURE, BLOOD (ROUTINE X 2)  RESP PANEL BY RT-PCR (RSV, FLU A&B, COVID)  RVPGX2  ACETAMINOPHEN LEVEL  AMMONIA  ETHANOL  LACTIC ACID, PLASMA  LACTIC ACID, PLASMA  MAGNESIUM  SALICYLATE LEVEL  URINALYSIS, ROUTINE W REFLEX MICROSCOPIC  I-STAT ARTERIAL BLOOD GAS, ED  TROPONIN I (HIGH SENSITIVITY)      EKG  EKG Interpretation  Date/Time:    Ventricular Rate:    PR Interval:    QRS Duration:   QT Interval:    QTC Calculation:   R  Axis:     Text Interpretation:           Imaging Studies ordered: I ordered imaging studies including CT head, CT cervical spine, CTA chest PE study, CT abdomen pelvis with contrast I independently visualized and interpreted imaging. I agree with the radiologist interpretation   Medicines ordered and prescription drug management: Meds ordered this encounter  Medications   potassium chloride 10 mEq in 100 mL IVPB   iohexol (OMNIPAQUE) 350 MG/ML injection 100 mL    -I have reviewed the patients home medicines and have made adjustments as needed  Critical interventions IV potassium repletion   Cardiac Monitoring: The patient was maintained on a cardiac monitor.  I personally viewed and interpreted the cardiac monitored which showed an underlying rhythm of: Normal sinus rhythm  Reevaluation: After the interventions noted above, I reevaluated the patient and found that they have :stayed the same  Co morbidities that complicate the patient evaluation  Past Medical History:  Diagnosis Date   Allergic rhinitis    Anxiety    Benzodiazepine dependence (HCC)    Bruxism    CAD (coronary artery disease)    S/p NSTEMI 01/2018 >> LHC: oLAD 20,  pLAD 20; oD1 20, oLCx 40, pRCA 99 >> PCI: DES to prox RCA // Echo 01/2018: EF 50-55; prob inf-lat and inf HK   Chronic diastolic heart failure    Echocardiogram 07/2018: EF 55-60, grade 1 diastolic dysfunction, normal wall motion, normal GLS (-22.3), PASP 28   Chronic foot pain    bunions, torn ligaments-on chronic pain medication   CTS (carpal tunnel syndrome) 06/08/2014   Depression    High cholesterol    Hypertension    MDD (major depressive disorder) 10/02/2017   Migraine    Myocardial infarction (HCC) 02/01/2018   Pneumonia 1986   left lung   RA (rheumatoid arthritis) (HCC)       Dispostion: Patient discussed with hospitalist will accept patient for admission.  Repeat potassium signed out to oncoming provider if patient stays in  the emergency department.  Final Clinical Impression(s) / ED Diagnoses Final diagnoses:  Weakness  Hypokalemia    Rx / DC Orders ED Discharge Orders     None         Marita Kansas, PA-C 08/20/22 2354    Ernie Avena, MD 08/21/22 0006

## 2022-08-21 DIAGNOSIS — R0602 Shortness of breath: Secondary | ICD-10-CM | POA: Diagnosis not present

## 2022-08-21 DIAGNOSIS — K381 Appendicular concretions: Secondary | ICD-10-CM | POA: Diagnosis not present

## 2022-08-21 DIAGNOSIS — I5032 Chronic diastolic (congestive) heart failure: Secondary | ICD-10-CM | POA: Diagnosis not present

## 2022-08-21 DIAGNOSIS — G8929 Other chronic pain: Secondary | ICD-10-CM | POA: Diagnosis present

## 2022-08-21 DIAGNOSIS — R109 Unspecified abdominal pain: Secondary | ICD-10-CM | POA: Diagnosis not present

## 2022-08-21 DIAGNOSIS — Z720 Tobacco use: Secondary | ICD-10-CM

## 2022-08-21 DIAGNOSIS — R9431 Abnormal electrocardiogram [ECG] [EKG]: Secondary | ICD-10-CM | POA: Diagnosis not present

## 2022-08-21 DIAGNOSIS — I2510 Atherosclerotic heart disease of native coronary artery without angina pectoris: Secondary | ICD-10-CM | POA: Diagnosis not present

## 2022-08-21 DIAGNOSIS — I10 Essential (primary) hypertension: Secondary | ICD-10-CM | POA: Diagnosis present

## 2022-08-21 DIAGNOSIS — Z1152 Encounter for screening for COVID-19: Secondary | ICD-10-CM | POA: Diagnosis not present

## 2022-08-21 DIAGNOSIS — I110 Hypertensive heart disease with heart failure: Secondary | ICD-10-CM | POA: Diagnosis not present

## 2022-08-21 DIAGNOSIS — N179 Acute kidney failure, unspecified: Secondary | ICD-10-CM | POA: Diagnosis not present

## 2022-08-21 DIAGNOSIS — W19XXXA Unspecified fall, initial encounter: Secondary | ICD-10-CM

## 2022-08-21 DIAGNOSIS — E876 Hypokalemia: Secondary | ICD-10-CM

## 2022-08-21 DIAGNOSIS — K8020 Calculus of gallbladder without cholecystitis without obstruction: Secondary | ICD-10-CM | POA: Diagnosis not present

## 2022-08-21 DIAGNOSIS — R4182 Altered mental status, unspecified: Secondary | ICD-10-CM | POA: Diagnosis present

## 2022-08-21 DIAGNOSIS — R2681 Unsteadiness on feet: Secondary | ICD-10-CM | POA: Diagnosis not present

## 2022-08-21 DIAGNOSIS — Z7902 Long term (current) use of antithrombotics/antiplatelets: Secondary | ICD-10-CM | POA: Diagnosis not present

## 2022-08-21 DIAGNOSIS — R2689 Other abnormalities of gait and mobility: Secondary | ICD-10-CM | POA: Diagnosis not present

## 2022-08-21 DIAGNOSIS — G9341 Metabolic encephalopathy: Secondary | ICD-10-CM | POA: Diagnosis not present

## 2022-08-21 DIAGNOSIS — E782 Mixed hyperlipidemia: Secondary | ICD-10-CM

## 2022-08-21 DIAGNOSIS — I700 Atherosclerosis of aorta: Secondary | ICD-10-CM | POA: Diagnosis not present

## 2022-08-21 DIAGNOSIS — F17210 Nicotine dependence, cigarettes, uncomplicated: Secondary | ICD-10-CM | POA: Diagnosis not present

## 2022-08-21 DIAGNOSIS — Z79899 Other long term (current) drug therapy: Secondary | ICD-10-CM | POA: Diagnosis not present

## 2022-08-21 DIAGNOSIS — F418 Other specified anxiety disorders: Secondary | ICD-10-CM

## 2022-08-21 DIAGNOSIS — R531 Weakness: Secondary | ICD-10-CM | POA: Diagnosis not present

## 2022-08-21 LAB — BASIC METABOLIC PANEL
Anion gap: 8 (ref 5–15)
Anion gap: 7 (ref 5–15)
BUN: 24 mg/dL — ABNORMAL HIGH (ref 8–23)
BUN: 17 mg/dL (ref 8–23)
CO2: 35 mmol/L — ABNORMAL HIGH (ref 22–32)
CO2: 36 mmol/L — ABNORMAL HIGH (ref 22–32)
Calcium: 7.7 mg/dL — ABNORMAL LOW (ref 8.9–10.3)
Calcium: 8.5 mg/dL — ABNORMAL LOW (ref 8.9–10.3)
Chloride: 94 mmol/L — ABNORMAL LOW (ref 98–111)
Chloride: 95 mmol/L — ABNORMAL LOW (ref 98–111)
Creatinine, Ser: 1.06 mg/dL — ABNORMAL HIGH (ref 0.44–1.00)
Creatinine, Ser: 0.97 mg/dL (ref 0.44–1.00)
GFR, Estimated: 55 mL/min — ABNORMAL LOW (ref >60)
GFR, Estimated: >60 mL/min (ref >60)
Glucose, Bld: 98 mg/dL (ref 70–99)
Glucose, Bld: 113 mg/dL — ABNORMAL HIGH (ref 70–99)
Potassium: 2.2 mmol/L — CL (ref 3.5–5.1)
Potassium: 3.4 mmol/L — ABNORMAL LOW (ref 3.5–5.1)
Sodium: 137 mmol/L (ref 135–145)
Sodium: 138 mmol/L (ref 135–145)

## 2022-08-21 LAB — MAGNESIUM: Magnesium: 2.2 mg/dL (ref 1.7–2.4)

## 2022-08-21 LAB — TROPONIN I (HIGH SENSITIVITY): Troponin I (High Sensitivity): 20 ng/L — ABNORMAL HIGH (ref <18)

## 2022-08-21 MED ORDER — CLONAZEPAM 0.5 MG PO TABS
0.5000 mg | ORAL_TABLET | Freq: Every day | ORAL | Status: DC
  Administered 2022-08-22: 0.5 mg via ORAL
  Filled 2022-08-21: qty 1

## 2022-08-21 MED ORDER — ACETAMINOPHEN 325 MG PO TABS
650.0000 mg | ORAL_TABLET | Freq: Four times a day (QID) | ORAL | Status: DC | PRN
  Administered 2022-08-21 – 2022-08-22 (×2): 650 mg via ORAL
  Filled 2022-08-21 (×2): qty 2

## 2022-08-21 MED ORDER — METOPROLOL TARTRATE 25 MG PO TABS
25.0000 mg | ORAL_TABLET | Freq: Every day | ORAL | Status: DC
  Administered 2022-08-22 – 2022-08-23 (×2): 25 mg via ORAL
  Filled 2022-08-21 (×2): qty 1

## 2022-08-21 MED ORDER — ALBUTEROL SULFATE (2.5 MG/3ML) 0.083% IN NEBU: 2.5000 mg | INHALATION_SOLUTION | Freq: Four times a day (QID) | RESPIRATORY_TRACT | Status: DC | PRN

## 2022-08-21 MED ORDER — CALCIUM GLUCONATE-NACL 2-0.675 GM/100ML-% IV SOLN
2.0000 g | Freq: Once | INTRAVENOUS | Status: AC
  Administered 2022-08-21: 2000 mg via INTRAVENOUS
  Filled 2022-08-21: qty 100

## 2022-08-21 MED ORDER — SODIUM CHLORIDE 0.9% FLUSH
3.0000 mL | Freq: Two times a day (BID) | INTRAVENOUS | Status: DC
  Administered 2022-08-21 – 2022-08-23 (×5): 3 mL via INTRAVENOUS

## 2022-08-21 MED ORDER — ESCITALOPRAM OXALATE 10 MG PO TABS
15.0000 mg | ORAL_TABLET | Freq: Every day | ORAL | Status: DC
  Administered 2022-08-22 – 2022-08-23 (×2): 15 mg via ORAL
  Filled 2022-08-21 (×2): qty 2

## 2022-08-21 MED ORDER — GABAPENTIN 300 MG PO CAPS
300.0000 mg | ORAL_CAPSULE | Freq: Two times a day (BID) | ORAL | Status: DC
  Administered 2022-08-22 – 2022-08-23 (×3): 300 mg via ORAL
  Filled 2022-08-21 (×3): qty 1

## 2022-08-21 MED ORDER — GABAPENTIN ENACARBIL ER 600 MG PO TBCR: 1.0000 | EXTENDED_RELEASE_TABLET | Freq: Every day | ORAL | Status: DC

## 2022-08-21 MED ORDER — ACETAMINOPHEN 650 MG RE SUPP: 650.0000 mg | Freq: Four times a day (QID) | RECTAL | Status: DC | PRN

## 2022-08-21 MED ORDER — ENOXAPARIN SODIUM 40 MG/0.4ML IJ SOSY
40.0000 mg | PREFILLED_SYRINGE | INTRAMUSCULAR | Status: DC
  Administered 2022-08-21 – 2022-08-22 (×2): 40 mg via SUBCUTANEOUS
  Filled 2022-08-21 (×2): qty 0.4

## 2022-08-21 MED ORDER — CLONAZEPAM 0.5 MG PO TABS
1.0000 mg | ORAL_TABLET | Freq: Every day | ORAL | Status: DC
  Administered 2022-08-21 – 2022-08-22 (×2): 1 mg via ORAL
  Filled 2022-08-21 (×2): qty 2

## 2022-08-21 MED ORDER — POTASSIUM CHLORIDE 20 MEQ PO PACK
40.0000 meq | PACK | Freq: Once | ORAL | Status: AC
  Administered 2022-08-21: 40 meq via ORAL
  Filled 2022-08-21: qty 2

## 2022-08-21 MED ORDER — POTASSIUM CHLORIDE CRYS ER 20 MEQ PO TBCR
40.0000 meq | EXTENDED_RELEASE_TABLET | ORAL | Status: DC
  Administered 2022-08-21: 40 meq via ORAL
  Filled 2022-08-21: qty 2

## 2022-08-21 MED ORDER — BUPRENORPHINE HCL-NALOXONE HCL 8-2 MG SL SUBL
1.0000 | SUBLINGUAL_TABLET | Freq: Three times a day (TID) | SUBLINGUAL | Status: DC
  Administered 2022-08-22 – 2022-08-23 (×2): 1 via SUBLINGUAL
  Filled 2022-08-21 (×2): qty 1

## 2022-08-21 MED ORDER — POTASSIUM CHLORIDE 10 MEQ/100ML IV SOLN
10.0000 meq | INTRAVENOUS | Status: AC
  Administered 2022-08-21 (×2): 10 meq via INTRAVENOUS
  Filled 2022-08-21 (×2): qty 100

## 2022-08-21 MED ORDER — ROSUVASTATIN CALCIUM 20 MG PO TABS
40.0000 mg | ORAL_TABLET | Freq: Every day | ORAL | Status: DC
  Administered 2022-08-22 – 2022-08-23 (×2): 40 mg via ORAL
  Filled 2022-08-21 (×2): qty 2

## 2022-08-21 MED ORDER — POLYETHYLENE GLYCOL 3350 17 G PO PACK
17.0000 g | PACK | Freq: Every day | ORAL | Status: DC
  Administered 2022-08-21 – 2022-08-23 (×3): 17 g via ORAL
  Filled 2022-08-21 (×5): qty 1

## 2022-08-21 MED ORDER — POTASSIUM CHLORIDE 10 MEQ/100ML IV SOLN
10.0000 meq | INTRAVENOUS | Status: AC
  Administered 2022-08-21 (×4): 10 meq via INTRAVENOUS
  Filled 2022-08-21: qty 100

## 2022-08-21 MED ORDER — CLONAZEPAM 0.5 MG PO TABS: 0.5000 mg | ORAL_TABLET | ORAL | Status: DC

## 2022-08-21 MED ORDER — CLOPIDOGREL BISULFATE 75 MG PO TABS
75.0000 mg | ORAL_TABLET | Freq: Every day | ORAL | Status: DC
  Administered 2022-08-22 – 2022-08-23 (×2): 75 mg via ORAL
  Filled 2022-08-21 (×2): qty 1

## 2022-08-21 NOTE — ED Notes (Signed)
This nurse assisted patient to bedside commode.

## 2022-08-21 NOTE — Evaluation (Signed)
Physical Therapy Evaluation Patient Details Name: Meghan Schwartz MRN: [id number] DOB: [birth year] Today's Date: 08/21/2022  History of Present Illness  Pt is 72 yo female admitted on 08/20/22 with AMS, weakness, and tremors found to have hypokalemia.  Pt with hx including anxiety, CHF, CAD, HTN, MI, RA  Clinical Impression  Pt admitted with above diagnosis. At baseline, pt reports independent.  States recently using cane due to knee pain and had a referral for outpt PT for knee pain.  Today, pt has been transferring at mod I level in her room and demonstrated safely.  With longer distance gait pt did require min guard for safety, used her cane, and fatigued easily.  She did have a drop in O2 with longer distance ambulation when trialed on RA.  Pt minimally below her baseline and will benefit from acute PT to address while hospitalized.  Recommend pt f/u with outpt as previously recommended by her PCP for knee pain at d/c.  Pt currently with functional limitations due to the deficits listed below (see PT Problem List). Pt will benefit from acute skilled PT to increase their independence and safety with mobility to allow discharge.       General comments (skin integrity, edema, etc.): Pt was on 3 L with O2 sats 99%.  Tried RA and sats 93% rest.  Ambulated on RA and initially up to 95% but then dropped to 83% by the end of the walk.  Reapplied 3 L with sats back to 95% within 1 minute.   Recommendations for follow up therapy are one component of a multi-disciplinary discharge planning process, led by the attending physician.  Recommendations may be updated based on patient status, additional functional criteria and insurance authorization.  Follow Up Recommendations       Assistance Recommended at Discharge Intermittent Supervision/Assistance  Patient can return home with the following  A little help with walking and/or transfers;A little help with bathing/dressing/bathroom;Assistance with  cooking/housework;Help with stairs or ramp for entrance    Equipment Recommendations None recommended by PT  Recommendations for Other Services       Functional Status Assessment Patient has had a recent decline in their functional status and demonstrates the ability to make significant improvements in function in a reasonable and predictable amount of time.     Precautions / Restrictions Precautions Precautions: Fall      Mobility  Bed Mobility Overal bed mobility: Modified Independent Bed Mobility: Supine to Sit, Sit to Supine     Supine to sit: Modified independent (Device/Increase time) Sit to supine: Modified independent (Device/Increase time)        Transfers Overall transfer level: Needs assistance Equipment used: None Transfers: Sit to/from Stand, Bed to chair/wheelchair/BSC Sit to Stand: Modified independent (Device/Increase time)   Step pivot transfers: Modified independent (Device/Increase time)       General transfer comment: Pt has been transferring to bsc on her own and demonstrated safely during session    Ambulation/Gait Ambulation/Gait assistance: Min guard Gait Distance (Feet): 80 Feet Assistive device: Straight cane Gait Pattern/deviations: Step-through pattern, Decreased stride length, Trunk flexed, Drifts right/left Gait velocity: decreased     General Gait Details: Pt with mild unsteadiness with gait requiring min guard.  She did fatigue easily with DOE of 2/4 and drop in O2 sats when on RA (see below)  Stairs            Wheelchair Mobility    Modified Rankin (Stroke Patients Only)       Balance  Overall balance assessment: Needs assistance Sitting-balance support: No upper extremity supported Sitting balance-Leahy Scale: Normal     Standing balance support: No upper extremity supported Standing balance-Leahy Scale: Fair                               Pertinent Vitals/Pain Pain Assessment Pain Assessment:  No/denies pain    Home Living Family/patient expects to be discharged to:: Private residence Living Arrangements: Spouse/significant other Available Help at Discharge: Family;Available 24 hours/day Type of Home: House Home Access: Stairs to enter Entrance Stairs-Rails: None Entrance Stairs-Number of Steps: 2 Alternate Level Stairs-Number of Steps: flight Home Layout: Two level;Bed/bath upstairs Home Equipment: Cane - single point;Rolling Walker (2 wheels) Additional Comments: Wears O2 at night    Prior Function Prior Level of Function : Driving;Independent/Modified Independent             Mobility Comments: Pt could ambulate in community; reports just started using cane when she went out b/c of knee pain; states PCP had referred her to outpt PT for knee pain ADLs Comments: Independent with adls and iadls     Hand Dominance        Extremity/Trunk Assessment   Upper Extremity Assessment Upper Extremity Assessment: Overall WFL for tasks assessed    Lower Extremity Assessment Lower Extremity Assessment: RLE deficits/detail;LLE deficits/detail RLE Deficits / Details: ROM WFL ; MMT 5/5 LLE Deficits / Details: ROM WFL; MMT grossly 5/5; does report L knee pain due to cyst    Cervical / Trunk Assessment Cervical / Trunk Assessment: Normal  Communication   Communication: No difficulties  Cognition Arousal/Alertness: Awake/alert Behavior During Therapy: WFL for tasks assessed/performed Overall Cognitive Status: Within Functional Limits for tasks assessed                                          General Comments General comments (skin integrity, edema, etc.): Pt was on 3 L with O2 sats 99%.  Tried RA and sats 93% rest.  Ambulated on RA and initial up to 95% but then dropped to 83% by the end of the walk.  Reapplied 3 L with sats back to 95% within 1 minute.    Exercises     Assessment/Plan    PT Assessment Patient needs continued PT services  PT  Problem List Decreased strength;Pain;Cardiopulmonary status limiting activity;Decreased range of motion;Decreased activity tolerance;Decreased balance;Decreased mobility;Decreased knowledge of use of DME       PT Treatment Interventions DME instruction;Therapeutic exercise;Gait training;Balance training;Stair training;Functional mobility training;Therapeutic activities;Patient/family education;Modalities    PT Goals (Current goals can be found in the Care Plan section)  Acute Rehab PT Goals Patient Stated Goal: return home PT Goal Formulation: With patient Time For Goal Achievement: 09/04/22 Potential to Achieve Goals: Good    Frequency Min 1X/week     Co-evaluation               AM-PAC PT "6 Clicks" Mobility  Outcome Measure Help needed turning from your back to your side while in a flat bed without using bedrails?: A Little Help needed moving from lying on your back to sitting on the side of a flat bed without using bedrails?: A Little Help needed moving to and from a bed to a chair (including a wheelchair)?: A Little Help needed standing up from a chair using your arms (  e.g., wheelchair or bedside chair)?: A Little Help needed to walk in hospital room?: A Little Help needed climbing 3-5 steps with a railing? : A Little 6 Click Score: 18    End of Session Equipment Utilized During Treatment: Gait belt Activity Tolerance: Patient tolerated treatment well Patient left: in chair;with call bell/phone within reach (no alartm as pt transferring mod I level) Nurse Communication: Mobility status PT Visit Diagnosis: Other abnormalities of gait and mobility (R26.89);Muscle weakness (generalized) (M62.81);Pain Pain - Right/Left: Left Pain - part of body: Knee    Time: 1733-1757 PT Time Calculation (min) (ACUTE ONLY): 24 min   Charges:   PT Evaluation $PT Eval Low Complexity: 1 Low PT Treatments $Gait Training: 8-22 mins        Anise Salvo, PT Acute Rehab [facility] [phone]   Rayetta Humphrey 08/21/2022, 6:08 PM

## 2022-08-21 NOTE — ED Notes (Signed)
Arterial Blood Gas obtained. Venous charted in error.

## 2022-08-21 NOTE — ED Notes (Signed)
Patient offered wheelchair to restroom, Patient had large BM, and asked to walk back to room. Patient ambulated back to room with nurse as standby assistance. No complications.

## 2022-08-21 NOTE — H&P (Signed)
History and Physical    Patient: Meghan Schwartz UJW:[id number] DOB: 09-16-1950 DOA: 08/20/2022 DOS: the patient was seen and examined on 08/21/2022 PCP: Pcp, No  Patient coming from: Transfer from [facility]  Chief Complaint:  Chief Complaint  Patient presents with   Altered Mental Status   HPI: Meghan Schwartz is a 72 y.o. female with medical history significant of hypertension, hyperlipidemia, CAD, diastolic congestive heart failure, rheumatoid arthritis, chronic pain, and tobacco abuse who presented with complaints of not feeling like herself.  She recalls falling out of the bed 2 days ago but was able to pull herself back up.  She reported having difficulty speaking and holding things in her hand.  S notes that it took over an hour to text her daughter did not notify her husband for help.  She reported falling several times and did hit her head, but only specifically reported also falling in the bathroom over a chair.  She denies having any new medication changes.  Patient does note having significant difficulty in swallowing her potassium pills despite cutting them in half for which she does not  take them always as prescribed.  Normally she ambulates without need of assistance and only uses a cane while out.  Denies having any vomiting, diarrhea, or recent sick contacts to her knowledge.  Normally she is just on oxygen at night.  She notes that she had recently been constipated (last bowel movement reported 2 to 3 days ago) and had nausea.  Husband reported that the patient was confused and weak  On admission patient noted to be afebrile with O2 sats noted to be as low as 85% with improvement greater than 90% on 3 L of nasal cannula oxygen.  Labs from 6/25 significant for hemoglobin 11.7, sodium 134, potassium 2.2, CO2 35, BUN 30, creatinine 1.42, and magnesium 1.8. Venous blood gas noted pH 7.477, pCO2 54.4, and pO2 82. Repeat labs this morning note potassium 2.2, CO2 35, creatinine 1.06, BUN 24,  calcium 7.7, magnesium 2.2. CT scan of the head, cervical spine, chest, abdomen, and pelvis had been obtained without acute abnormality appreciated.  Patient has been ordered potassium chloride totaling 130 mEq.  Review of Systems: As mentioned in the history of present illness. All other systems reviewed and are negative. Past Medical History:  Diagnosis Date   Allergic rhinitis    Anxiety    Benzodiazepine dependence (HCC)    Bruxism    CAD (coronary artery disease)    S/p NSTEMI 01/2018 >> LHC: oLAD 20, pLAD 20; oD1 20, oLCx 40, pRCA 99 >> PCI: DES to prox RCA // Echo 01/2018: EF 50-55; prob inf-lat and inf HK   Chronic diastolic heart failure    Echocardiogram 07/2018: EF 55-60, grade 1 diastolic dysfunction, normal wall motion, normal GLS (-22.3), PASP 28   Chronic foot pain    bunions, torn ligaments-on chronic pain medication   CTS (carpal tunnel syndrome) 06/08/2014   Depression    High cholesterol    Hypertension    MDD (major depressive disorder) 10/02/2017   Migraine    Myocardial infarction (HCC) 02/01/2018   Pneumonia 1986   left lung   RA (rheumatoid arthritis) (HCC)    Past Surgical History:  Procedure Laterality Date   BTL  2000   CARPAL TUNNEL RELEASE Left    CESAREAN SECTION  1978. 1983   CORONARY STENT INTERVENTION N/A 01/27/2018   Procedure: CORONARY STENT INTERVENTION;  Surgeon: Kathleene Hazel, MD;  Location: [facility];  Service: Cardiovascular;  Laterality: N/A;   eye implants     FOOT SURGERY Left 2008   front teeth removed after injury     KNEE ARTHROSCOPY WITH LATERAL MENISECTOMY Left 02/02/2019   Procedure: LEFT KNEE ARTHROSCOPY, MEDIAL AND LATERAL MENISECTOMY, EXCISION OF LEFT LATERAL MENISCAL CYST;  Surgeon: Valeria Batman, MD;  Location: [facility];  Service: Orthopedics;  Laterality: Left;   LEFT HEART CATH AND CORONARY ANGIOGRAPHY N/A 01/27/2018   Procedure: LEFT HEART CATH AND CORONARY ANGIOGRAPHY;  Surgeon: Kathleene Hazel,  MD;  Location: [facility];  Service: Cardiovascular;  Laterality: N/A;   pre cancerous lesion removed from forehead     TOTAL ABDOMINAL HYSTERECTOMY W/ BILATERAL SALPINGOOPHORECTOMY  2002   Dr. Jackelyn Knife   VAGINAL HYSTERECTOMY  2001   Social History:  reports that she has been smoking cigarettes. She started smoking about 52 years ago. She has a 4.00 pack-year smoking history. She has never been exposed to tobacco smoke. She has never used smokeless tobacco. She reports that she does not drink alcohol and does not use drugs.  Allergies  Allergen Reactions   Erythromycin Nausea And Vomiting    stomach upset    Family History  Problem Relation Age of Onset   Thyroid disease Mother    Breast cancer Mother    Parkinson's disease Father    Raynaud syndrome Maternal Aunt    Arthritis Maternal Grandmother    Heart attack Maternal Grandmother    Stroke Paternal Grandmother    Heart attack Paternal Grandfather     Prior to Admission medications   Medication Sig Start Date End Date Taking? Authorizing Provider  acetaminophen (TYLENOL) 500 MG tablet Take 500 mg by mouth every 6 (six) hours as needed (For knee pain).    [provider]  Cetirizine HCl (ZYRTEC PO) Take 1 capsule by mouth daily.    [provider]  cholecalciferol (VITAMIN D3) 25 MCG (1000 UT) tablet Take 5,000 Units by mouth daily. Patient states she takes 5 a day.    [provider]  clonazePAM (KLONOPIN) 1 MG tablet Take 0.5-1 mg by mouth See admin instructions. 0.5 mg midday, 1 mg at bedtime 12/31/17   [provider]  cloNIDine (CATAPRES) 0.1 MG tablet  10/07/17   [provider]  clopidogrel (PLAVIX) 75 MG tablet TAKE 1 TABLET BY MOUTH EVERY DAY 07/23/22   Pricilla Riffle, MD  escitalopram (LEXAPRO) 10 MG tablet 10 mg. Take 1 1/2 tablets by mouth daily    [provider]  estradiol (VIVELLE-DOT) 0.075 MG/24HR Place 1 patch onto the skin 2 (two) times a week. Tuesday  and Friday    [provider]  etanercept (ENBREL SURECLICK) 50 MG/ML injection Inject 50 mg into the skin once a week. 12/06/21   Fuller Plan, MD  furosemide (LASIX) 20 MG tablet  03/05/18   [provider]  GABAPENTIN, ONCE-DAILY, PO Take by mouth. Started 06/06/2022. Patient taking for ten days.    [provider]  HORIZANT 600 MG TBCR Oral for 30 Days    [provider]  hydrOXYzine (ATARAX) 25 MG tablet Take 25-50 mg by mouth 2 (two) times daily as needed. Patient not taking: Reported on 07/30/2022 02/24/21   [provider]  ibuprofen (ADVIL) 200 MG tablet Take by mouth. 05/05/18   [provider]  KLOR-CON M20 20 MEQ tablet TAKE 1 TABLET BY MOUTH 3 TIMES DAILY. 07/16/22   Pricilla Riffle,  MD  meloxicam (MOBIC) 15 MG tablet     [provider]  metolazone (ZAROXOLYN) 2.5 MG tablet Take one tablet (2.5 mg) by mouth twice a week 30 minutes prior to taking your Torsemide. 04/15/22   Pricilla Riffle, MD  metoprolol tartrate (LOPRESSOR) 25 MG tablet TAKE 1 TABLET BY MOUTH EVERY DAY 06/04/22   Pricilla Riffle, MD  Multiple Vitamins-Minerals (MULTIVITAMIN WITH MINERALS) tablet Take 1 tablet by mouth daily.    [provider]  nitroGLYCERIN (NITROSTAT) 0.4 MG SL tablet Place 1 tablet (0.4 mg total) under the tongue every 5 (five) minutes as needed for chest pain. 01/28/18   Rai, Ripudeep K, MD  ondansetron (ZOFRAN-ODT) 4 MG disintegrating tablet Take 4 mg by mouth as needed. 02/23/21   [provider]  PRESCRIPTION MEDICATION Apply 1 application onto the skin and legs for scar (UREA Cream)    [provider]  rosuvastatin (CRESTOR) 40 MG tablet TAKE 1 TABLET BY MOUTH EVERY DAY 06/04/22   Pricilla Riffle, MD  sulfamethoxazole-trimethoprim (BACTRIM DS) 800-160 MG tablet Take 1 tablet by mouth 2 (two) times daily. Patient not taking: Reported on 06/12/2022    [provider]  ticagrelor (BRILINTA) 90 MG TABS tablet  Brilinta 90 mg tablet TAKE 1 TABLET BY MOUTH TWICE DAILY Patient not taking: Reported on 07/30/2022 01/28/18   [provider]  torsemide (DEMADEX) 20 MG tablet Take 2 tablets (40 mg total) by mouth daily. 12/31/21   Tereso Newcomer T, PA-C  triamcinolone cream (KENALOG) 0.1 % Apply topically 2 (two) times daily. Patient not taking: Reported on 07/30/2022 12/26/20   [provider]  ZUBSOLV 5.7-1.4 MG SUBL Place 1 tablet under the tongue 3 (three) times daily. 12/30/18   [provider]    Physical Exam: Vitals:   08/21/22 0921 08/21/22 0925 08/21/22 1022 08/21/22 1026  BP: 121/71   (!) 141/61  Pulse: 71   78  Resp: 14  18 18   Temp:  97.8 F (36.6 C)  98.3 F (36.8 C)  TempSrc:  Oral  Oral  SpO2: 100%   92%  Weight:   71.7 kg   Height:   5\' 3"  (1.6 m)    Constitutional: Elderly female who appears to be in no acute distress Eyes: PERRL, lids and conjunctivae normal ENMT: Mucous membranes are moist.  Swelling of the right cheek. Neck: normal, supple  Respiratory: clear to auscultation bilaterally, no wheezing, no crackles. Normal respiratory effort.   Cardiovascular: Regular rate and rhythm, no murmurs / rubs / gallops. No extremity edema.   Abdomen: no tenderness, no masses palpated.   Bowel sounds positive.  Musculoskeletal: no clubbing / cyanosis. No joint deformity upper and lower extremities. Good ROM, no contractures. Normal muscle tone.  Skin: Swelling of the right cheek. Neurologic: CN 2-12 grossly intact. Sensation intact, DTR normal. Strength 5/5 in all 4.  Psychiatric: Normal judgment and insight. Alert and oriented x 3. Normal mood.   Data Reviewed:  EKG revealed Sinus rhythm at 95 bpm with prolonged QTc 578. Reviewed labs, imaging, and pertinent records as noted above in HPI  Assessment and Plan:  Hypokalemia Acute.  Initial potassium noted to be 2.2.  With repeat check 2.2 this morning.  Patient received 130 mEq of potassium chloride while in the  ED.  She reported having significant difficulty swallowing potassium capsules for which she was not taking them as prescribed. -Admit to a telemetry bed -Consider substituting potassium chloride packets for potassium replacement due  to reports of difficulty swallowing tablets -Continue to monitor and replace as needed  Hypocalcemia Acute.  Calcium noted to be 7.7 with ionized calcium 1.07. -Give 2 g of calcium gluconate IV -Continue to monitor and replace as needed  Prolonged QT QTc prolonged at 578. -QT prolonging medications -Correcting electrolyte abnormalities -Recheck QT interval  Falls, initial encounter Prior to arrival.  Patient reported falling specifically at least 2 times at home.  CT imaging did not note any acute intercranial abnormality.  Exact cause of patient's falls is not totally clear. -Check orthostatic vitals -Follow-up telemetry -PT/OT to evaluate and treat  Acute metabolic encephalopathy Patient noted to be acutely altered and confused at home prior to arrival.  Possibly secondary to multiple electrolyte derangements versus multifactorial given pain and anxiety medications.  Patient appears to be alert and oriented x 3 at this time -Neurochecks  Essential hypertension Patient's blood pressures and 102/77 -141/61 despite not receiving home blood pressure medicine regimen.  Home blood pressure regimen appears to include clonidine 0.1 mg 3 times daily, metoprolol 25 mg daily, and torsemide 40 mg daily. -Resume metoprolol  -Determine when medically appropriate to resume other medications  CAD Patient with prior history of drug-eluting RCA stent in 01/2018. -Continue Plavix, statin  Acute kidney injury Resolving.  Creatinine initially elevated to 1.42 with BUN 30.  Repeat checks after IV replacement of potassium noted creatinine improved to 1.06 with BUN 24.  Baseline creatinine around 0.9. -Held possible nephrotoxic agents  Chronic pain -Continue pharmacy  substitution of Suboxone  Diastolic congestive heart failure Chronic.  Patient did not appear to be acutely fluid overloaded on physical exam.  Last EF noted to be 55 to 60% with grade 1 diastolic dysfunction when checked on 05/09/2022. -Strict intake and output -Check daily weight -Question need of adjustment to her torsemide and metolazone dosing  Anxiety and depression -Continue Lexapro and clonazepam as needed  Hyperlipidemia -Continue Crestor  Tobacco abuse Patient reports smoking a pack of cigarettes every 2 weeks -Continue to counsel need of cessation of tobacco use  DVT prophylaxis: Lovenox Advance Care Planning:   Code Status: Full Code    Consults: none  Family Communication: Husband not available by phone  Severity of Illness: The appropriate patient status for this patient is OBSERVATION. Observation status is judged to be reasonable and necessary in order to provide the required intensity of service to ensure the patient's safety. The patient's presenting symptoms, physical exam findings, and initial radiographic and laboratory data in the context of their medical condition is felt to place them at decreased risk for further clinical deterioration. Furthermore, it is anticipated that the patient will be medically stable for discharge from the hospital within 2 midnights of admission.   Author: Clydie Braun, MD 08/21/2022 11:00 AM  For on call review [email or web address].

## 2022-08-21 NOTE — ED Notes (Signed)
Attempted report on [facility]. Left number for nurse to call back

## 2022-08-22 DIAGNOSIS — E876 Hypokalemia: Secondary | ICD-10-CM | POA: Diagnosis not present

## 2022-08-22 LAB — BASIC METABOLIC PANEL
Anion gap: 7 (ref 5–15)
Anion gap: 13 (ref 5–15)
BUN: 13 mg/dL (ref 8–23)
BUN: 9 mg/dL (ref 8–23)
CO2: 33 mmol/L — ABNORMAL HIGH (ref 22–32)
CO2: 34 mmol/L — ABNORMAL HIGH (ref 22–32)
Calcium: 8.6 mg/dL — ABNORMAL LOW (ref 8.9–10.3)
Calcium: 9.3 mg/dL (ref 8.9–10.3)
Chloride: 96 mmol/L — ABNORMAL LOW (ref 98–111)
Chloride: 93 mmol/L — ABNORMAL LOW (ref 98–111)
Creatinine, Ser: 0.92 mg/dL (ref 0.44–1.00)
Creatinine, Ser: 0.94 mg/dL (ref 0.44–1.00)
GFR, Estimated: >60 mL/min (ref >60)
GFR, Estimated: >60 mL/min (ref >60)
Glucose, Bld: 92 mg/dL (ref 70–99)
Glucose, Bld: 103 mg/dL — ABNORMAL HIGH (ref 70–99)
Potassium: 3.2 mmol/L — ABNORMAL LOW (ref 3.5–5.1)
Potassium: 3.3 mmol/L — ABNORMAL LOW (ref 3.5–5.1)
Sodium: 136 mmol/L (ref 135–145)
Sodium: 140 mmol/L (ref 135–145)

## 2022-08-22 LAB — CBC
HCT: 30.7 % — ABNORMAL LOW (ref 36.0–46.0)
Hemoglobin: 9.8 g/dL — ABNORMAL LOW (ref 12.0–15.0)
MCH: 25.8 pg — ABNORMAL LOW (ref 26.0–34.0)
MCHC: 31.9 g/dL (ref 30.0–36.0)
MCV: 80.8 fL (ref 80.0–100.0)
Platelets: 141 10*3/uL — ABNORMAL LOW (ref 150–400)
RBC: 3.8 MIL/uL — ABNORMAL LOW (ref 3.87–5.11)
RDW: 14.3 % (ref 11.5–15.5)
WBC: 3.1 10*3/uL — ABNORMAL LOW (ref 4.0–10.5)
nRBC: 0 % (ref 0.0–0.2)

## 2022-08-22 LAB — MAGNESIUM: Magnesium: 2.2 mg/dL (ref 1.7–2.4)

## 2022-08-22 MED ORDER — POTASSIUM CHLORIDE 20 MEQ PO PACK
40.0000 meq | PACK | Freq: Three times a day (TID) | ORAL | Status: DC
  Administered 2022-08-22: 40 meq via ORAL
  Filled 2022-08-22 (×3): qty 2

## 2022-08-22 MED ORDER — POTASSIUM CHLORIDE 10 MEQ/100ML IV SOLN
10.0000 meq | INTRAVENOUS | Status: DC
  Filled 2022-08-22: qty 100

## 2022-08-22 MED ORDER — POTASSIUM CHLORIDE 10 MEQ/100ML IV SOLN
10.0000 meq | INTRAVENOUS | Status: AC
  Administered 2022-08-22 (×4): 10 meq via INTRAVENOUS
  Filled 2022-08-22 (×4): qty 100

## 2022-08-22 MED ORDER — POTASSIUM CHLORIDE 20 MEQ PO PACK
40.0000 meq | PACK | Freq: Three times a day (TID) | ORAL | Status: DC
  Filled 2022-08-22: qty 2

## 2022-08-22 NOTE — Evaluation (Signed)
Occupational Therapy Evaluation Patient Details Name: Meghan Schwartz MRN: [id number] DOB: 04-Sep-1950 Today's Date: 08/22/2022   History of Present Illness Pt is 72 yo female admitted on 08/20/22 with AMS, weakness, and tremors found to have hypokalemia.  Pt with hx including anxiety, CHF, CAD, HTN, MI, RA   Clinical Impression   Pt presents with decline in function and safety with LB ADLs, ADL mobility with impaired strength and endurance. PTA pt lived at home with her husband and was Ind with ADLs, IADls, home mgt, grocery shopping, cooking and drives. Pt currently requires sup using SPC for ADL mobility to transfer to toilet and shower and for LB ADLs. Pt was on 3 L with O2 sats 98%. Trialed on RA and O2 SATs 92% rest. With in room activity on RA, O2 dropped to 86%.  Reapplied 3 L O2 with recovered SATs back to 93 % within 1 minute. Pt would benefit from acute OT services to address impairments to maximize level of function and safety     Recommendations for follow up therapy are one component of a multi-disciplinary discharge planning process, led by the attending physician.  Recommendations may be updated based on patient status, additional functional criteria and insurance authorization.   Assistance Recommended at Discharge Intermittent Supervision/Assistance  Patient can return home with the following A little help with bathing/dressing/bathroom;Assistance with cooking/housework;Assist for transportation    Functional Status Assessment  Patient has had a recent decline in their functional status and demonstrates the ability to make significant improvements in function in a reasonable and predictable amount of time.  Equipment Recommendations  Tub/shower seat    Recommendations for Other Services       Precautions / Restrictions Precautions Precautions: Fall Restrictions Weight Bearing Restrictions: No      Mobility Bed Mobility Overal bed mobility: Modified Independent Bed  Mobility: Supine to Sit     Supine to sit: Modified independent (Device/Increase time)          Transfers Overall transfer level: Needs assistance Equipment used: Straight cane Transfers: Sit to/from Stand, Bed to chair/wheelchair/BSC Sit to Stand: Supervision, Modified independent (Device/Increase time)     Step pivot transfers: Modified independent (Device/Increase time)     General transfer comment: Pt has been transferring to [facility] on her own and demonstrated safely during session. PT walked to bathroom with SPC to tranfer on/off toilet and in/out of shower      Balance Overall balance assessment: Needs assistance Sitting-balance support: No upper extremity supported Sitting balance-Leahy Scale: Good     Standing balance support: No upper extremity supported                               ADL either performed or assessed with clinical judgement   ADL Overall ADL's : Needs assistance/impaired                                       General ADL Comments: Sup for safety with shower transfers and LB ADLs     Vision Ability to See in Adequate Light: 0 Adequate Patient Visual Report: No change from baseline       Perception     Praxis      Pertinent Vitals/Pain Pain Assessment Pain Assessment: No/denies pain     Hand Dominance Right   Extremity/Trunk Assessment Upper Extremity Assessment Upper Extremity  Assessment: Generalized weakness   Lower Extremity Assessment Lower Extremity Assessment: Defer to PT evaluation   Cervical / Trunk Assessment Cervical / Trunk Assessment: Normal   Communication Communication Communication: No difficulties   Cognition Arousal/Alertness: Awake/alert Behavior During Therapy: WFL for tasks assessed/performed Overall Cognitive Status: Within Functional Limits for tasks assessed                                       General Comments       Exercises     Shoulder  Instructions      Home Living Family/patient expects to be discharged to:: Private residence Living Arrangements: Spouse/significant other Available Help at Discharge: Family;Available 24 hours/day Type of Home: House Home Access: Stairs to enter [employer] of Steps: 2 Entrance Stairs-Rails: None Home Layout: Two level;Bed/bath upstairs Alternate Level Stairs-Number of Steps: flight Alternate Level Stairs-Rails: Right;Left Bathroom Shower/Tub: Producer, television/film/video: Standard     Home Equipment: Cane - single Librarian, academic (2 wheels)   Additional Comments: Wears O2 at night      Prior Functioning/Environment Prior Level of Function : Driving;Independent/Modified Independent             Mobility Comments: Pt could ambulate in community; reports just started using cane when she went out b/c of knee pain; states PCP had referred her to outpt PT for knee pain ADLs Comments: Independent with ADLs, IADLs, drives, cooks, grocery shops        OT Problem List: Decreased strength;Decreased activity tolerance;Decreased knowledge of use of DME or AE      OT Treatment/Interventions: Self-care/ADL training;DME and/or AE instruction;Therapeutic activities;Patient/family education;Therapeutic exercise    OT Goals(Current goals can be found in the care plan section) Acute Rehab OT Goals Patient Stated Goal: go home OT Goal Formulation: With patient Time For Goal Achievement: 09/05/22 Potential to Achieve Goals: Good ADL Goals Pt Will Perform Lower Body Bathing: with set-up;with modified independence Pt Will Perform Lower Body Dressing: with set-up;with modified independence Pt Will Perform Toileting - Clothing Manipulation and hygiene: with modified independence;sit to/from stand Pt Will Perform Tub/Shower Transfer: with modified independence;ambulating;shower seat Additional ADL Goal #1: Pt will safely gather ADL/toiletry items with Sup to stand at  sink to complete grooming/hygiene tasks  OT Frequency: Min 1X/week    Co-evaluation              AM-PAC OT "6 Clicks" Daily Activity     Outcome Measure Help from another person eating meals?: None Help from another person taking care of personal grooming?: A Little Help from another person toileting, which includes using toliet, bedpan, or urinal?: A Little Help from another person bathing (including washing, rinsing, drying)?: A Little Help from another person to put on and taking off regular upper body clothing?: None Help from another person to put on and taking off regular lower body clothing?: A Little 6 Click Score: 20   End of Session Equipment Utilized During Treatment: Other (comment) (BSC, cane)  Activity Tolerance: Patient tolerated treatment well Patient left: in chair;with call bell/phone within reach  OT Visit Diagnosis: Other abnormalities of gait and mobility (R26.89)                Time: 4098-1191 OT Time Calculation (min): 25 min Charges:  OT General Charges $OT Visit: 1 Visit OT Evaluation $OT Eval Low Complexity: 1 Low OT Treatments $Self Care/Home Management :  8-22 mins    Galen Manila 08/22/2022, 12:37 PM

## 2022-08-22 NOTE — Care Management Obs Status (Signed)
MEDICARE OBSERVATION STATUS NOTIFICATION   Patient Details  Name: Meghan Schwartz MRN: [id number] Date of Birth: 05/24/1950   Medicare Observation Status Notification Given:  Yes    Leone Haven, RN 08/22/2022, 11:10 AM

## 2022-08-22 NOTE — Progress Notes (Addendum)
Mobility Specialist Progress Note:   08/22/22 1500  Mobility  Activity Ambulated with assistance in hallway  Level of Assistance Contact guard assist, steadying assist  Assistive Device Front wheel walker  Distance Ambulated (ft) 120 ft  Activity Response Tolerated well  Mobility Referral Yes  $Mobility charge 1 Mobility  Mobility Specialist Start Time (ACUTE ONLY) 1430  Mobility Specialist Stop Time (ACUTE ONLY) 1445  Mobility Specialist Time Calculation (min) (ACUTE ONLY) 15 min    Pt received in bed, agreeable to mobility. Pt denied any feelings of pain or SOB. Asymptomatic throughout. VSS on 3 L. Pt returned to EOB, husband present in room.   Leory Plowman  Mobility Specialist Please contact via [employer] office at [phone]

## 2022-08-22 NOTE — Progress Notes (Signed)
PROGRESS NOTE    Meghan Schwartz  GLO:[id number] DOB: 1951/02/04 DOA: 08/20/2022 PCP: Pcp, No  Outpatient Specialists:     Brief Narrative:  Patient is a 72 year old Caucasian female past medical history significant for hypertension, hyperlipidemia, coronary artery disease, diastolic congestive heart failure, rheumatoid arthritis, chronic.  Tobacco use.  Patient has been on diuretics for a long time and was not compliant with potassium supplements.  Patient was admitted with symptomatic hypokalemia.  Potassium was 2.2 on presentation.  With potassium supplementation, potassium has improved to 3.3 today.  Magnesium is within normal range.  Will continue to monitor and replete potassium.  Diuretics on hold.  Assessment & Plan:   Principal Problem:   Hypokalemia Active Problems:   Hyperlipidemia   Depression with anxiety   Tobacco abuse   Coronary artery disease involving native coronary artery of native heart without angina pectoris   Hypocalcemia   Falls, initial encounter   Prolonged QT interval   Acute metabolic encephalopathy   Essential hypertension   AKI (acute kidney injury) (HCC)   Chronic pain   Chronic diastolic CHF (congestive heart failure) (HCC)   Hypokalemia -Patient was on torsemide prior to presentation. -Patient was not compliant with potassium supplementation. -Potassium on presentation was 2.2. -Potassium is being replaced.  Last BMP revealed potassium of 3.3. -Continue to replete potassium. -Likely discharge back tomorrow.     Hypocalcemia -Resolved. -Consistent 9.3 today.   -Continue to monitor and replace as needed   Prolonged QT QTc prolonged at 578. -QT prolonging medications -Correcting electrolyte abnormalities -Recheck QT interval 08/22/2022: Resolved.  Likely secondary to hypokalemia and hypocalcemia.   Falls, initial encounter Prior to arrival.  Patient reported falling specifically at least 2 times at home.  CT imaging did not note any acute  intercranial abnormality.  Exact cause of patient's falls is not totally clear. -Check orthostatic vitals -Follow-up telemetry -PT/OT to evaluate and treat   Acute metabolic encephalopathy Patient noted to be acutely altered and confused at home prior to arrival.  Possibly secondary to multiple electrolyte derangements versus multifactorial given pain and anxiety medications.  Patient appears to be alert and oriented x 3 at this time -Neurochecks 08/22/2022: Seems to be improving.   Essential hypertension Patient's blood pressures and 102/77 -141/61 despite not receiving home blood pressure medicine regimen.  Home blood pressure regimen appears to include clonidine 0.1 mg 3 times daily, metoprolol 25 mg daily, and torsemide 40 mg daily. -Resume metoprolol  -Determine when medically appropriate to resume other medications 08/22/2022: Controlled.  Continue to monitor closely.   CAD Patient with prior history of drug-eluting RCA stent in 01/2018. -Continue Plavix, statin   Acute kidney injury Resolving.  Creatinine initially elevated to 1.42 with BUN 30.  Repeat checks after IV replacement of potassium noted creatinine improved to 1.06 with BUN 24.  Baseline creatinine around 0.9. -Held possible nephrotoxic agents 08/22/2022: AKI has resolved.   Chronic pain -Continue pharmacy substitution of Suboxone   Diastolic congestive heart failure Chronic.  Patient did not appear to be acutely fluid overloaded on physical exam.  Last EF noted to be 55 to 60% with grade 1 diastolic dysfunction when checked on 05/09/2022. -Strict intake and output -Check daily weight -Question need of adjustment to her torsemide and metolazone dosing 08/22/2022: Compensated.  Torsemide is on hold.   Anxiety and depression -Continue Lexapro and clonazepam as needed   Hyperlipidemia -Continue Crestor   Tobacco abuse Patient reports smoking a pack of cigarettes every 2 weeks -Continue  to counsel need of cessation  of tobacco use     DVT prophylaxis: Subcutaneous Lovenox. Code Status: Full code. Family Communication: Husband. Disposition Plan: Likely discharge back home tomorrow.   Consultants:  None.  Procedures:  None.  Antimicrobials:  None.   Subjective: Severe muscle weakness and fatigue.  Objective: Vitals:   08/22/22 0556 08/22/22 0727 08/22/22 1122 08/22/22 1510  BP: 134/77 118/71 111/70 120/70  Pulse: 83 75 (!) 56 66  Resp: 18 20 14 18   Temp: 98.2 F (36.8 C) 97.7 F (36.5 C) 98.1 F (36.7 C) 98.4 F (36.9 C)  TempSrc: Oral Oral Oral Oral  SpO2: 98% 96% 98% 99%  Weight: 72.8 kg     Height:        Intake/Output Summary (Last 24 hours) at 08/22/2022 1757 Last data filed at 08/22/2022 0917 Gross per 24 hour  Intake 943.8 ml  Output --  Net 943.8 ml   Filed Weights   08/20/22 2122 08/21/22 1022 08/22/22 0556  Weight: 70.3 kg 71.7 kg 72.8 kg    Examination:  General exam: Appears calm and comfortable  Respiratory system: Clear to auscultation. Cardiovascular system: S1 & S2 heard Gastrointestinal system: Abdomen is soft and nontender.  Central nervous system: Alert and oriented. No focal neurological deficits. Extremities: No obvious edema.  Data Reviewed: I have personally reviewed following labs and imaging studies  CBC: Recent Labs  Lab 08/20/22 2130 08/20/22 2220 08/22/22 0101  WBC 6.7  --  3.1*  HGB 11.7* 11.6* 9.8*  HCT 35.9* 34.0* 30.7*  MCV 79.1*  --  80.8  PLT 162  --  141*   Basic Metabolic Panel: Recent Labs  Lab 08/20/22 2130 08/20/22 2201 08/20/22 2220 08/21/22 0427 08/21/22 1411 08/22/22 0101 08/22/22 1515  NA 134*  --  132* 137 138 136 140  K 2.2*  --  <2.0* 2.2* 3.4* 3.2* 3.3*  CL 86*  --   --  94* 95* 96* 93*  CO2 35*  --   --  35* 36* 33* 34*  GLUCOSE 118*  --   --  98 113* 92 103*  BUN 30*  --   --  24* 17 13 9   CREATININE 1.42*  --   --  1.06* 0.97 0.92 0.94  CALCIUM 9.5  --   --  7.7* 8.5* 8.6* 9.3  MG  --  1.8   --  2.2  --   --  2.2   GFR: Estimated Creatinine Clearance: 51 mL/min (by C-G formula based on SCr of 0.94 mg/dL). Liver Function Tests: Recent Labs  Lab 08/20/22 2130  AST 30  ALT 14  ALKPHOS 84  BILITOT 1.1  PROT 7.7  ALBUMIN 4.3   No results for input(s): "LIPASE", "AMYLASE" in the last 168 hours. Recent Labs  Lab 08/20/22 2201  AMMONIA 16   Coagulation Profile: No results for input(s): "INR", "PROTIME" in the last 168 hours. Cardiac Enzymes: No results for input(s): "CKTOTAL", "CKMB", "CKMBINDEX", "TROPONINI" in the last 168 hours. BNP (last 3 results) Recent Labs    04/12/22 1243 04/23/22 1641  PROBNP 802* 180   HbA1C: No results for input(s): "HGBA1C" in the last 72 hours. CBG: Recent Labs  Lab 08/20/22 2129  GLUCAP 126*   Lipid Profile: No results for input(s): "CHOL", "HDL", "LDLCALC", "TRIG", "CHOLHDL", "LDLDIRECT" in the last 72 hours. Thyroid Function Tests: No results for input(s): "TSH", "T4TOTAL", "FREET4", "T3FREE", "THYROIDAB" in the last 72 hours. Anemia Panel: No results for input(s): "VITAMINB12", "FOLATE", "  FERRITIN", "TIBC", "IRON", "RETICCTPCT" in the last 72 hours. Urine analysis:    Component Value Date/Time   COLORURINE COLORLESS (A) 08/20/2022 2332   APPEARANCEUR CLEAR 08/20/2022 2332   APPEARANCEUR Turbid (A) 09/15/2020 1156   LABSPEC 1.020 08/20/2022 2332   PHURINE 7.0 08/20/2022 2332   GLUCOSEU NEGATIVE 08/20/2022 2332   HGBUR LARGE (A) 08/20/2022 2332   BILIRUBINUR NEGATIVE 08/20/2022 2332   BILIRUBINUR Negative 09/15/2020 1156   KETONESUR NEGATIVE 08/20/2022 2332   PROTEINUR TRACE (A) 08/20/2022 2332   NITRITE NEGATIVE 08/20/2022 2332   LEUKOCYTESUR NEGATIVE 08/20/2022 2332   Sepsis Labs: @LABRCNTIP (procalcitonin:4,lacticidven:4)  ) Recent Results (from the past 240 hour(s))  Blood culture (routine x 2)     Status: None (Preliminary result)   Collection Time: 08/20/22  9:32 PM   Specimen: BLOOD  Result Value Ref  Range Status   Specimen Description   Final    BLOOD BLOOD LEFT FOREARM Performed at [facility], [address] [place], [place] [address]    Special Requests   Final    BOTTLES DRAWN AEROBIC AND ANAEROBIC Blood Culture adequate volume Performed at [facility], [address] [place], [place] [address]    Culture   Final    NO GROWTH 1 DAY Performed at [facility], [address], [place], [place] [address]    Report Status PENDING  Incomplete  Resp panel by RT-PCR (RSV, Flu A&B, Covid) Anterior Nasal Swab     Status: None   Collection Time: 08/20/22 10:02 PM   Specimen: Anterior Nasal Swab  Result Value Ref Range Status   SARS Coronavirus 2 by RT PCR NEGATIVE NEGATIVE Final    Comment: (NOTE) SARS-CoV-2 target nucleic acids are NOT DETECTED.  The SARS-CoV-2 RNA is generally detectable in upper respiratory specimens during the acute phase of infection. The lowest concentration of SARS-CoV-2 viral copies this assay can detect is 138 copies/mL. A negative result does not preclude SARS-Cov-2 infection and should not be used as the sole basis for treatment or other patient management decisions. A negative result may occur with  improper specimen collection/handling, submission of specimen other than nasopharyngeal swab, presence of viral mutation(s) within the areas targeted by this assay, and inadequate number of viral copies(<138 copies/mL). A negative result must be combined with clinical observations, patient history, and epidemiological information. The expected result is Negative.  Fact Sheet for Patients:  BloggerCourse.com  Fact Sheet for Healthcare Providers:  SeriousBroker.it  This test is no t yet approved or cleared by the [place] FDA and  has been authorized for detection and/or diagnosis of SARS-CoV-2 by FDA under an Emergency Use Authorization (EUA). This  EUA will remain  in effect (meaning this test can be used) for the duration of the COVID-19 declaration under Section 564(b)(1) of the Act, 21 U.S.C.section 360bbb-3(b)(1), unless the authorization is terminated  or revoked sooner.       Influenza A by PCR NEGATIVE NEGATIVE Final   Influenza B by PCR NEGATIVE NEGATIVE Final    Comment: (NOTE) The Xpert Xpress SARS-CoV-2/FLU/RSV plus assay is intended as an aid in the diagnosis of influenza from Nasopharyngeal swab specimens and should not be used as a sole basis for treatment. Nasal washings and aspirates are unacceptable for Xpert Xpress SARS-CoV-2/FLU/RSV testing.  Fact Sheet for Patients: BloggerCourse.com  Fact Sheet for Healthcare Providers: SeriousBroker.it  This test is not yet approved or cleared by the [place] FDA and has been authorized for detection and/or diagnosis  of SARS-CoV-2 by FDA under an Emergency Use Authorization (EUA). This EUA will remain in effect (meaning this test can be used) for the duration of the COVID-19 declaration under Section 564(b)(1) of the Act, 21 U.S.C. section 360bbb-3(b)(1), unless the authorization is terminated or revoked.     Resp Syncytial Virus by PCR NEGATIVE NEGATIVE Final    Comment: (NOTE) Fact Sheet for Patients: BloggerCourse.com  Fact Sheet for Healthcare Providers: SeriousBroker.it  This test is not yet approved or cleared by the [place] FDA and has been authorized for detection and/or diagnosis of SARS-CoV-2 by FDA under an Emergency Use Authorization (EUA). This EUA will remain in effect (meaning this test can be used) for the duration of the COVID-19 declaration under Section 564(b)(1) of the Act, 21 U.S.C. section 360bbb-3(b)(1), unless the authorization is terminated or revoked.  Performed at [facility], [address], [place], [place] [address]   Blood culture (routine x 2)     Status: None (Preliminary result)   Collection Time: 08/20/22 10:15 PM   Specimen: BLOOD  Result Value Ref Range Status   Specimen Description   Final    BLOOD LEFT ANTECUBITAL Performed at [facility], [address] [place], [place] [address]    Special Requests   Final    BOTTLES DRAWN AEROBIC AND ANAEROBIC Blood Culture adequate volume Performed at [facility], [address] [place], [place] [address]    Culture   Final    NO GROWTH 1 DAY Performed at [facility], [address] [place], [place] [address]    Report Status PENDING  Incomplete         Radiology Studies: CT Angio Chest PE W and/or Wo Contrast  Result Date: 08/20/2022 CLINICAL DATA:  Pulmonary embolism suspected.  High probability. EXAM: CT ANGIOGRAPHY CHEST WITH CONTRAST TECHNIQUE: Multidetector CT imaging of the chest was performed using the standard protocol during bolus administration of intravenous contrast. Multiplanar CT image reconstructions and MIPs were obtained to evaluate the vascular anatomy. RADIATION DOSE REDUCTION: This exam was performed according to the departmental dose-optimization program which includes automated exposure control, adjustment of the mA and/or kV according to patient size and/or use of iterative reconstruction technique. CONTRAST:  80mL OMNIPAQUE IOHEXOL 350 MG/ML SOLN COMPARISON:  CT chest without and with contrast 03/05/2019 FINDINGS: Cardiovascular: The cardiac size is normal. There are scattered three-vessel coronary artery calcifications with a stent in the right coronary artery. No pericardial effusion is seen. The pulmonary trunk is slightly prominent but unchanged, measuring 2.9 cm. No arterial embolus is seen. There is aortic tortuosity, mild scattered calcific plaques in the aorta and great vessels, without aortic or great vessel aneurysm, stenosis or  dissection. Pulmonary veins are normal caliber. Mediastinum/Nodes: No enlarged mediastinal, hilar, or axillary lymph nodes. Thyroid gland, trachea, and esophagus demonstrate no significant findings. The main bronchi are patent without filling defects. Lungs/Pleura: There is diffuse bronchial thickening. This was seen in 2021 also. There are mild paraseptal and centrilobular emphysematous changes in the upper lobes. There is no notable progression in this. No pleural effusion, thickening or pneumothorax. Mild chronic elevation right hemidiaphragm. There is a stable 5 mm noncalcified subpleural right middle lobe nodule on 5:87, consistent with a benign entity. There are no further nodules and no active infiltrate is seen. There are mild dependent atelectatic changes, including in the lingular base. Upper Abdomen: Mildly distended gallbladder with stones in the proximal lumen. No wall thickening of the visualized portion.  No acute upper abdominal CT findings. Contemporaneous abdomen and pelvis CT with contrast was dictated separately. 1.2 cm stable rim calcified splenic artery aneurysm is again shown. Abdominal aortic atherosclerosis. Musculoskeletal: Degenerative disc changes with mild spondylosis lower thoracic spine. No acute or significant osseous findings. No aggressive lesion is seen in the thoracic cage. Review of the MIP images confirms the above findings. IMPRESSION: 1. No acute chest CT or CTA findings. 2. Aortic and coronary artery atherosclerosis. 3. Chronic bronchial thickening and emphysematous change. 4. Cholelithiasis, with a distended but otherwise unremarkable visualized gallbladder. 5. Stable 1.2 cm rim calcified splenic artery aneurysm. 6. Stable 5 mm right middle lobe nodule. Aortic Atherosclerosis (ICD10-I70.0) and Emphysema (ICD10-J43.9). Electronically Signed   By: Almira Bar M.D.   On: 08/20/2022 23:30   CT ABDOMEN PELVIS W CONTRAST  Result Date: 08/20/2022 CLINICAL DATA:  Abdominal  pain, acute, nonlocalized EXAM: CT ABDOMEN AND PELVIS WITH CONTRAST TECHNIQUE: Multidetector CT imaging of the abdomen and pelvis was performed using the standard protocol following bolus administration of intravenous contrast. RADIATION DOSE REDUCTION: This exam was performed according to the departmental dose-optimization program which includes automated exposure control, adjustment of the mA and/or kV according to patient size and/or use of iterative reconstruction technique. CONTRAST:  80mL OMNIPAQUE IOHEXOL 350 MG/ML SOLN COMPARISON:  CT abdomen pelvis 09/18/2020 FINDINGS: Lower chest: No acute abnormality.  Coronary artery calcification. Hepatobiliary: No focal liver abnormality. Calcified gallstone noted within the gallbladder lumen. No gallbladder wall thickening or pericholecystic fluid. No biliary dilatation. Pancreas: Diffusely atrophic. No focal lesion. Otherwise normal pancreatic contour. No surrounding inflammatory changes. No main pancreatic ductal dilatation. Spleen: Normal in size without focal abnormality. Adrenals/Urinary Tract: No adrenal nodule bilaterally. Bilateral kidneys enhance symmetrically. Fluid density lesion of the right kidney likely represents a simple renal cyst. Simple renal cysts, in the absence of clinically indicated signs/symptoms, require no independent follow-up. No hydronephrosis. No hydroureter. The urinary bladder is unremarkable. On delayed imaging, there is no urothelial wall thickening and there are no filling defects in the opacified portions of the bilateral collecting systems or ureters. Stomach/Bowel: Stomach is within normal limits. No evidence of bowel wall thickening or dilatation. Stool throughout the colon. Appendix appears normal. Appendicoliths noted. Vascular/Lymphatic: No abdominal aorta or iliac aneurysm. Mild to moderate atherosclerotic plaque of the aorta and its branches. No abdominal, pelvic, or inguinal lymphadenopathy. Reproductive: Status post  hysterectomy. No adnexal masses. Other: No intraperitoneal free fluid. No intraperitoneal free gas. No organized fluid collection. Musculoskeletal: No abdominal wall hernia or abnormality. No suspicious lytic or blastic osseous lesions. No acute displaced fracture. Multilevel intervertebral disc space vacuum phenomenon. IMPRESSION: 1. Cholelithiasis with no CT evidence of acute cholecystitis. 2. Stool throughout the colon.  Correlate for constipation. 3.  Aortic Atherosclerosis (ICD10-I70.0). Electronically Signed   By: Tish Frederickson M.D.   On: 08/20/2022 23:21   CT Head Wo Contrast  Result Date: 08/20/2022 CLINICAL DATA:  Blunt poly trauma. EXAM: CT HEAD WITHOUT CONTRAST CT CERVICAL SPINE WITHOUT CONTRAST TECHNIQUE: Multidetector CT imaging of the head and cervical spine was performed following the standard protocol without intravenous contrast. Multiplanar CT image reconstructions of the cervical spine were also generated. RADIATION DOSE REDUCTION: This exam was performed according to the departmental dose-optimization program which includes automated exposure control, adjustment of the mA and/or kV according to patient size and/or use of iterative reconstruction technique. COMPARISON:  CT examination dated December 19, 2018 FINDINGS: CT HEAD FINDINGS Brain: No evidence of acute infarction, hemorrhage, hydrocephalus, extra-axial collection  or mass lesion/mass effect. Vascular: No hyperdense vessel or unexpected calcification. Skull: Normal. Negative for fracture or focal lesion. Sinuses/Orbits: No acute finding. Other: None. CT CERVICAL SPINE FINDINGS Alignment: Grade 1 anterolisthesis of C4. Straightening of the cervical spine. Skull base and vertebrae: No acute fracture. No primary bone lesion or focal pathologic process. Soft tissues and spinal canal: No prevertebral fluid or swelling. No visible canal hematoma. Disc levels: C2-C3: No significant disc bulge, spinal canal or neural foraminal stenosis.  C3-C4: Right facet joint arthropathy. No significant spinal canal or neural foraminal stenosis. C4-C5: Bilateral facet joint arthropathy. Significant spinal canal or neural foraminal stenosis. C5-C6: No significant disc bulge, spinal canal or neural foraminal stenosis. C6-C7: Disc height loss and uncovertebral joint arthropathy. No significant spinal canal or neural foraminal stenosis. C7-T1: No significant disc bulge, spinal canal or neural foraminal stenosis. Upper chest: Centrilobular emphysematous changes. No acute abnormality Other: None IMPRESSION: CT head: 1. No acute intracranial abnormality. CT cervical spine: 1. No acute cervical spine fracture or traumatic subluxation. 2. Multilevel degenerative disc and facet joint disease. No significant spinal canal or neural foraminal stenosis. Emphysema (ICD10-J43.9). Electronically Signed   By: Larose Hires D.O.   On: 08/20/2022 23:08   CT Cervical Spine Wo Contrast  Result Date: 08/20/2022 CLINICAL DATA:  Blunt poly trauma. EXAM: CT HEAD WITHOUT CONTRAST CT CERVICAL SPINE WITHOUT CONTRAST TECHNIQUE: Multidetector CT imaging of the head and cervical spine was performed following the standard protocol without intravenous contrast. Multiplanar CT image reconstructions of the cervical spine were also generated. RADIATION DOSE REDUCTION: This exam was performed according to the departmental dose-optimization program which includes automated exposure control, adjustment of the mA and/or kV according to patient size and/or use of iterative reconstruction technique. COMPARISON:  CT examination dated December 19, 2018 FINDINGS: CT HEAD FINDINGS Brain: No evidence of acute infarction, hemorrhage, hydrocephalus, extra-axial collection or mass lesion/mass effect. Vascular: No hyperdense vessel or unexpected calcification. Skull: Normal. Negative for fracture or focal lesion. Sinuses/Orbits: No acute finding. Other: None. CT CERVICAL SPINE FINDINGS Alignment: Grade 1  anterolisthesis of C4. Straightening of the cervical spine. Skull base and vertebrae: No acute fracture. No primary bone lesion or focal pathologic process. Soft tissues and spinal canal: No prevertebral fluid or swelling. No visible canal hematoma. Disc levels: C2-C3: No significant disc bulge, spinal canal or neural foraminal stenosis. C3-C4: Right facet joint arthropathy. No significant spinal canal or neural foraminal stenosis. C4-C5: Bilateral facet joint arthropathy. Significant spinal canal or neural foraminal stenosis. C5-C6: No significant disc bulge, spinal canal or neural foraminal stenosis. C6-C7: Disc height loss and uncovertebral joint arthropathy. No significant spinal canal or neural foraminal stenosis. C7-T1: No significant disc bulge, spinal canal or neural foraminal stenosis. Upper chest: Centrilobular emphysematous changes. No acute abnormality Other: None IMPRESSION: CT head: 1. No acute intracranial abnormality. CT cervical spine: 1. No acute cervical spine fracture or traumatic subluxation. 2. Multilevel degenerative disc and facet joint disease. No significant spinal canal or neural foraminal stenosis. Emphysema (ICD10-J43.9). Electronically Signed   By: Larose Hires D.O.   On: 08/20/2022 23:08        Scheduled Meds:  buprenorphine-naloxone  1 tablet Sublingual TID   clonazePAM  0.5 mg Oral Q1200   And   clonazePAM  1 mg Oral QHS   clopidogrel  75 mg Oral Daily   enoxaparin (LOVENOX) injection  40 mg Subcutaneous Q24H   escitalopram  15 mg Oral Daily   gabapentin  300 mg Oral BID  metoprolol tartrate  25 mg Oral Daily   polyethylene glycol  17 g Oral Daily   potassium chloride  40 mEq Oral Q8H   rosuvastatin  40 mg Oral Daily   sodium chloride flush  3 mL Intravenous Q12H   Continuous Infusions:  potassium chloride       LOS: 0 days    Time spent: 35 minutes.    Berton Mount, MD  [facility] Pager #: [phone] 7PM-7AM contact night coverage as  above

## 2022-08-22 NOTE — TOC Initial Note (Signed)
Transition of Care [facility]) - Initial/Assessment Note    Patient Details  Name: Meghan Schwartz MRN: [id number] Date of Birth: [birth year]  Transition of Care [facility]) CM/SW Contact:    Leone Haven, RN Phone Number: 08/22/2022, 10:52 AM  Clinical Narrative:                 From home with spouse, she is indep.  She states her PCP is Dr. Chari Manning , she has insurance on file.  She currently does not have any HH services in place.  She states she has home oxygen with [employer] that she uses only at night 2.5 liters and a cane.  Her spouse will transport her home at dc,he is her support system and he is at home most of the day with her.  [facility] [address]  She states she made an apt for next week to go to [facility] for outpt physical therapy for her knee so she will be following up with this.    Expected Discharge Plan: OP Rehab Barriers to Discharge: Continued Medical Work up   Patient Goals and CMS Choice Patient states their goals for this hospitalization and ongoing recovery are:: return home   Choice offered to / list presented to : NA      Expected Discharge Plan and Services In-house Referral: NA Discharge Planning Services: CM Consult Post Acute Care Choice: NA Living arrangements for the past 2 months: Single Family Home                 DME Arranged: N/A DME Agency: NA       HH Arranged: NA          Prior Living Arrangements/Services Living arrangements for the past 2 months: Single Family Home Lives with:: Spouse Patient language and need for interpreter reviewed:: Yes Do you feel safe going back to the place where you live?: Yes      Need for Family Participation in Patient Care: Yes (Comment) Care giver support system in place?: Yes (comment)   Criminal Activity/Legal Involvement Pertinent to Current Situation/Hospitalization: No - Comment as needed  Activities of Daily Living Home Assistive Devices/Equipment:  Environmental consultant (specify type), Eyeglasses, Cane (specify quad or straight), Oxygen (Cane at bedside and 02 n/c at night) ADL Screening (condition at time of admission) Patient's cognitive ability adequate to safely complete daily activities?: Yes Is the patient deaf or have difficulty hearing?: No Does the patient have difficulty seeing, even when wearing glasses/contacts?: No Does the patient have difficulty concentrating, remembering, or making decisions?: No Patient able to express need for assistance with ADLs?: Yes Does the patient have difficulty dressing or bathing?: Yes Independently performs ADLs?: No Communication: Needs assistance Is this a change from baseline?: Change from baseline, expected to last <3 days Dressing (OT): Needs assistance Is this a change from baseline?: Change from baseline, expected to last >3 days Grooming: Needs assistance Is this a change from baseline?: Change from baseline, expected to last >3 days Feeding: Needs assistance Is this a change from baseline?: Change from baseline, expected to last >3 days Bathing: Needs assistance Is this a change from baseline?: Change from baseline, expected to last >3 days Toileting: Needs assistance Is this a change from baseline?: Change from baseline, expected to last >3days In/Out Bed: Needs assistance Is this a change from baseline?: Change from baseline, expected to last >3 days Walks in Home: Needs assistance Is this a change from baseline?:  Change from baseline, expected to last >3 days Does the patient have difficulty walking or climbing stairs?: Yes Weakness of Legs: Both Weakness of Arms/Hands: Both  Permission Sought/Granted Permission sought to share information with : Case Manager Permission granted to share information with : Yes, Verbal Permission Granted              Emotional Assessment Appearance:: Appears stated age Attitude/Demeanor/Rapport: Engaged Affect (typically observed):  Appropriate Orientation: : Oriented to Self, Oriented to Place, Oriented to  Time, Oriented to Situation Alcohol / Substance Use: Not Applicable Psych Involvement: No (comment)  Admission diagnosis:  Hypokalemia [E87.6] Weakness [R53.1] Patient Active Problem List   Diagnosis Date Noted   Hypocalcemia 08/21/2022   Falls, initial encounter 08/21/2022   Prolonged QT interval 08/21/2022   Acute metabolic encephalopathy 08/21/2022   Essential hypertension 08/21/2022   AKI (acute kidney injury) (HCC) 08/21/2022   Chronic pain 08/21/2022   Chronic diastolic CHF (congestive heart failure) (HCC) 08/21/2022   Effusion, left knee 06/12/2022   Leg edema, left 12/25/2021   Claw toe, left 08/07/2021   Dog bite 03/05/2021   High risk medication use 02/05/2021   Dry mouth 02/05/2021   Pain in left foot 01/31/2021   Bunion of great toe of right foot 06/15/2020   Hypoxia 03/05/2019   Derangement of posterior horn of medial meniscus 02/02/2019   Derangement of posterior horn of lateral meniscus 02/02/2019   Meniscal cyst, unspecified laterality 12/01/2018   Torn medial meniscus 12/01/2018   Primary osteoarthritis of left knee 12/01/2018   Coronary artery disease involving native coronary artery of native heart without angina pectoris 09/14/2018   (HFpEF) heart failure with preserved ejection fraction (HCC)    Chronic pain of left knee 05/15/2018   Mass of left knee 05/15/2018   Non-ST elevation (NSTEMI) myocardial infarction [facility])    Chest pain 01/25/2018   RA (rheumatoid arthritis) (HCC) 01/25/2018   Hyperlipidemia 01/25/2018   Hypokalemia 01/25/2018   Depression with anxiety 01/25/2018   Tobacco abuse 01/25/2018   Lower extremity cellulitis 01/25/2018   Benzodiazepine dependence (HCC)    MDD (major depressive disorder), recurrent severe, without psychosis (HCC) 10/02/2017   CTS (carpal tunnel syndrome) 06/08/2014   PCP:  Pcp, No Pharmacy:   [id number] [place], [place] - [address]  [address] [place] [address] Phone: [phone] Fax: [phone]  [facility] [place], [place] - [address] [place] [address] Phone: [phone] Fax: [phone]     Social Determinants of Health (SDOH) Social History: SDOH Screenings   Alcohol Screen: Low Risk  (10/02/2017)  Tobacco Use: High Risk (08/20/2022)   SDOH Interventions:     Readmission Risk Interventions     No data to display

## 2022-08-22 NOTE — Progress Notes (Signed)
Patient awoke in panicked state, grasping for staff and stating "he's trying to kill me". Verbally reoriented and de-escalated patient while creating space to explore this sensation. Patient stated "I know now it wasn't real" but visibly disturbed by the vivid and morbid nature of her nightmare. Provided coffee and conversation for comfort. Patient expresses appreciation.

## 2022-08-23 ENCOUNTER — Telehealth: Payer: Self-pay | Admitting: Internal Medicine

## 2022-08-23 ENCOUNTER — Telehealth: Payer: Self-pay | Admitting: Podiatry

## 2022-08-23 DIAGNOSIS — E876 Hypokalemia: Secondary | ICD-10-CM | POA: Diagnosis not present

## 2022-08-23 LAB — RENAL FUNCTION PANEL
Albumin: 2.7 g/dL — ABNORMAL LOW (ref 3.5–5.0)
Anion gap: 5 (ref 5–15)
BUN: 12 mg/dL (ref 8–23)
CO2: 34 mmol/L — ABNORMAL HIGH (ref 22–32)
Calcium: 8.7 mg/dL — ABNORMAL LOW (ref 8.9–10.3)
Chloride: 100 mmol/L (ref 98–111)
Creatinine, Ser: 0.89 mg/dL (ref 0.44–1.00)
GFR, Estimated: >60 mL/min (ref >60)
Glucose, Bld: 92 mg/dL (ref 70–99)
Phosphorus: 2.7 mg/dL (ref 2.5–4.6)
Potassium: 4.4 mmol/L (ref 3.5–5.1)
Sodium: 139 mmol/L (ref 135–145)

## 2022-08-23 LAB — CULTURE, BLOOD (ROUTINE X 2)

## 2022-08-23 MED ORDER — GABAPENTIN 300 MG PO CAPS: 300.0000 mg | ORAL_CAPSULE | Freq: Two times a day (BID) | ORAL | 0 refills | Status: DC

## 2022-08-23 MED ORDER — CLONAZEPAM 1 MG PO TABS: 1.0000 mg | ORAL_TABLET | Freq: Every day | ORAL | 0 refills | Status: AC

## 2022-08-23 MED ORDER — CLONAZEPAM 0.5 MG PO TABS: 0.5000 mg | ORAL_TABLET | Freq: Every day | ORAL | 0 refills | Status: DC

## 2022-08-23 NOTE — Discharge Summary (Addendum)
Physician Discharge Summary  Patient ID: Meghan Schwartz MRN: [id number] DOB/AGE: [birth year] 72 y.o.  Admit date: 08/20/2022 Discharge date: 08/23/2022  Admission Diagnoses:  Discharge Diagnoses:  Principal Problem:   Hypokalemia Active Problems:   Hyperlipidemia   Depression with anxiety   Tobacco abuse   Coronary artery disease involving native coronary artery of native heart without angina pectoris   Hypocalcemia   Falls, initial encounter   Prolonged QT interval   Acute metabolic encephalopathy   Essential hypertension   AKI (acute kidney injury) (HCC)   Chronic pain   Chronic diastolic CHF (congestive heart failure) (HCC)   Discharged Condition: stable  Hospital Course:  Patient is a 72 year old Caucasian female past medical history significant for hypertension, hyperlipidemia, coronary artery disease, diastolic congestive heart failure, rheumatoid arthritis, chronic. Tobacco use. Patient has been on diuretics for a long time and was not compliant with potassium supplements.  Prior to presentation, patient was on torsemide and metolazone.  Patient was admitted with symptomatic hypokalemia. Potassium was 2.2 on presentation.  Patient was admitted for further assessment and management.  Diuretics were held.  Potassium supplements were given.  Potassium today is 4.4.  Symptoms have resolved.  Magnesium level has been normal.  Patient is eager to be discharged back home today.  Will continue to hold diuretics for now.  Patient is also on some mind altering medications.  Gradually wean patient off benzodiazepines.  Hypokalemia -Patient was on torsemide and metolazone prior to presentation. -Patient was not compliant with potassium supplementation. -Potassium on presentation was 2.2. -Potassium is being replaced.  Last BMP revealed potassium of 4.4.   -PCP may consider repeating BMP in 5 days time.   Hypocalcemia -Resolved. -Calcium prior to discharge was 8.7 and corrected calcium  9.74.    -Continue to monitor and replace as needed   Prolonged QT QTc prolonged at 578. -Likely secondary to hypokalemia and hypocalcemia.   -Electrolytes have been corrected. -QTc interval is back to normal.   Falls, initial encounter Prior to arrival, patient reported falling specifically at least 2 times at home. -CT imaging did not note any acute intercranial abnormality.   -Exact cause of patient's falls is not totally clear, but may be related to severe hypokalemia. -PT/OT   Acute metabolic encephalopathy -Patient noted to be acutely altered and confused at home prior to arrival.   -Possibly secondary to multiple electrolyte derangements versus multifactorial given pain and anxiety medications.   -Encephalopathy has resolved.   Essential hypertension -Optimized. -Continue discharge medications.    CAD Patient with prior history of drug-eluting RCA stent in 01/2018. -Continue Plavix, statin   Acute kidney injury Resolved. -Serum creatinine prior to discharge was 0.89.   -On presentation, serum creatinine was 1.42 -Suspect AKI was prerenal.   Chronic pain -Continue home medications.     Diastolic congestive heart failure -Compensated.     Anxiety and depression -Continue Lexapro -PCP to consider when inpatient off of benzodiazepines.     Hyperlipidemia -Continue Crestor   Tobacco abuse -Counseled to quit.  P  Consults: None  Significant Diagnostic Studies:  -Potassium was 2.2 on admission.  Prior to discharge, potassium was 4.4.  Treatments: Diuretics were held.  Potassium supplements.  Discharge Exam: Blood pressure 111/72, pulse 63, temperature 97.6 F (36.4 C), temperature source Oral, resp. rate 15, height 5\' 3"  (1.6 m), weight 73.8 kg, SpO2 97 %.   Disposition: Discharge disposition: 01-Home or Self Care       Discharge Instructions  Diet - low sodium heart healthy   Complete by: As directed    Increase activity slowly   Complete  by: As directed       Allergies as of 08/23/2022       Reactions   Erythromycin Nausea And Vomiting   stomach upset        Medication List     STOP taking these medications    cloNIDine 0.1 MG tablet Commonly known as: CATAPRES   furosemide 20 MG tablet Commonly known as: LASIX   hydrOXYzine 25 MG tablet Commonly known as: ATARAX   ibuprofen 200 MG tablet Commonly known as: ADVIL   Klor-Con M20 20 MEQ tablet Generic drug: potassium chloride SA   metolazone 2.5 MG tablet Commonly known as: ZAROXOLYN   ondansetron 4 MG disintegrating tablet Commonly known as: ZOFRAN-ODT   sulfamethoxazole-trimethoprim 800-160 MG tablet Commonly known as: BACTRIM DS   torsemide 20 MG tablet Commonly known as: DEMADEX       TAKE these medications    acetaminophen 500 MG tablet Commonly known as: TYLENOL Take 500 mg by mouth every 6 (six) hours as needed (For knee pain).   cholecalciferol 25 MCG (1000 UNIT) tablet Commonly known as: VITAMIN D3 Take 5,000 Units by mouth daily. Patient states she takes 5 a day.   clonazePAM 0.5 MG tablet Commonly known as: KLONOPIN Take 1 tablet (0.5 mg total) by mouth daily at 12 noon. What changed:  medication strength how much to take when to take this   clonazePAM 1 MG tablet Commonly known as: KLONOPIN Take 1 tablet (1 mg total) by mouth at bedtime. What changed: You were already taking a medication with the same name, and this prescription was added. Make sure you understand how and when to take each.   clopidogrel 75 MG tablet Commonly known as: PLAVIX TAKE 1 TABLET BY MOUTH EVERY DAY   Enbrel SureClick 50 MG/ML injection Generic drug: etanercept Inject 50 mg into the skin once a week.   escitalopram 10 MG tablet Commonly known as: LEXAPRO Take 15 mg by mouth at bedtime.   estradiol 0.075 MG/24HR Commonly known as: VIVELLE-DOT Place 1 patch onto the skin 2 (two) times a week.   Horizant 600 MG Tbcr Generic drug:  Gabapentin Enacarbil Take 1 tablet by mouth every evening.   metoprolol tartrate 25 MG tablet Commonly known as: LOPRESSOR TAKE 1 TABLET BY MOUTH EVERY DAY   multivitamin with minerals tablet Take 1 tablet by mouth daily.   nitroGLYCERIN 0.4 MG SL tablet Commonly known as: NITROSTAT Place 1 tablet (0.4 mg total) under the tongue every 5 (five) minutes as needed for chest pain.   rosuvastatin 40 MG tablet Commonly known as: CRESTOR TAKE 1 TABLET BY MOUTH EVERY DAY   urea 10 % lotion Apply 1 Application topically daily as needed for dry skin.   Zubsolv 5.7-1.4 MG Subl Generic drug: Buprenorphine HCl-Naloxone HCl Place 1 tablet under the tongue 3 (three) times daily.   ZYRTEC PO Take 1 capsule by mouth daily.        Follow-up Information     [facility] Follow up.   Specialty: Rehabilitation Why: continue to follow up with your appointment for you outpt physical therapy Contact information: [address] [place], [address] [id number] mc [place] [address] [phone]        Camie Patience, FNP. Go on 08/28/2022.   Specialty: Family Medicine Why: @11 :30am Contact information: [address]  [place] [address] [phone]                 Time spent: 36 minutes.  SignedBarnetta Chapel 08/23/2022, 11:04 AM

## 2022-08-23 NOTE — Telephone Encounter (Signed)
Pt c/o medication issue:  1. Name of Medication:   torsemide (DEMADEX) 20 MG tablet   2. How are you currently taking this medication (dosage and times per day)?   3. Are you having a reaction (difficulty breathing--STAT)?   4. What is your medication issue?   Patient stated she recently had a hospital visit and was taken off this medication.  Patient wants call back to discuss medication changes.

## 2022-08-23 NOTE — Progress Notes (Signed)
Occupational Therapy Treatment Patient Details Name: Meghan Schwartz MRN: [id number] DOB: 1950-12-24 Today's Date: 08/23/2022   History of present illness Pt is 72 yo female admitted on 08/20/22 with AMS, weakness, and tremors found to have hypokalemia.  Pt with hx including anxiety, CHF, CAD, HTN, MI, RA   OT comments  Pt making good progress with functional goals. Pt's husband present and inquiring about shower safety. Pt is at Sup for safety with shower transfers and LB ADLs. Educated pt and her husband on shower seat and grab bars for safety for home use. Pt eager to d/c home this afternoon   Recommendations for follow up therapy are one component of a multi-disciplinary discharge planning process, led by the attending physician.  Recommendations may be updated based on patient status, additional functional criteria and insurance authorization.    Assistance Recommended at Discharge Intermittent Supervision/Assistance  Patient can return home with the following  A little help with bathing/dressing/bathroom;Assistance with cooking/housework;Assist for transportation   Equipment Recommendations       Recommendations for Other Services      Precautions / Restrictions Precautions Precautions: Fall Restrictions Weight Bearing Restrictions: No       Mobility Bed Mobility Overal bed mobility: Modified Independent Bed Mobility: Supine to Sit     Supine to sit: Modified independent (Device/Increase time)          Transfers Overall transfer level: Needs assistance Equipment used: Straight cane Transfers: Sit to/from Stand, Bed to chair/wheelchair/BSC Sit to Stand: Supervision, Modified independent (Device/Increase time)     Step pivot transfers: Modified independent (Device/Increase time)           Balance Overall balance assessment: Needs assistance Sitting-balance support: No upper extremity supported Sitting balance-Leahy Scale: Good     Standing balance support:  No upper extremity supported, During functional activity Standing balance-Leahy Scale: Fair                             ADL either performed or assessed with clinical judgement   ADL Overall ADL's : Needs assistance/impaired             Lower Body Bathing: Supervison/ safety;With caregiver independent assisting       Lower Body Dressing: Supervision/safety;With caregiver independent assisting   Toilet Transfer: Modified Independent;Ambulation   Toileting- Clothing Manipulation and Hygiene: Modified independent   Tub/ Shower Transfer: Supervision/safety;Ambulation;Grab bars Tub/Shower Transfer Details (indicate cue type and reason): simulated stepping in and out of shower with 3 inch threshold Functional mobility during ADLs: Modified independent General ADL Comments: Sup for safety with shower transfers and LB ADLs. Educated pt and her husband on shower seat and grab bars for safety for home use    Extremity/Trunk Assessment Upper Extremity Assessment Upper Extremity Assessment: Generalized weakness   Lower Extremity Assessment Lower Extremity Assessment: Defer to PT evaluation   Cervical / Trunk Assessment Cervical / Trunk Assessment: Normal    Vision Ability to See in Adequate Light: 0 Adequate Patient Visual Report: No change from baseline     Perception     Praxis      Cognition Arousal/Alertness: Awake/alert Behavior During Therapy: Flat affect, WFL for tasks assessed/performed Overall Cognitive Status: Within Functional Limits for tasks assessed  Exercises      Shoulder Instructions       General Comments      Pertinent Vitals/ Pain       Pain Assessment Pain Assessment: No/denies pain  Home Living                                          Prior Functioning/Environment              Frequency  Min 1X/week        Progress Toward Goals  OT  Goals(current goals can now be found in the care plan section)  Progress towards OT goals: Progressing toward goals     Plan Discharge plan remains appropriate    Co-evaluation                 AM-PAC OT "6 Clicks" Daily Activity     Outcome Measure   Help from another person eating meals?: None Help from another person taking care of personal grooming?: None Help from another person toileting, which includes using toliet, bedpan, or urinal?: None Help from another person bathing (including washing, rinsing, drying)?: A Little Help from another person to put on and taking off regular upper body clothing?: None Help from another person to put on and taking off regular lower body clothing?: A Little 6 Click Score: 22    End of Session Equipment Utilized During Treatment: Other (comment) (cane)  OT Visit Diagnosis: Other abnormalities of gait and mobility (R26.89)   Activity Tolerance Patient tolerated treatment well   Patient Left in chair;with call bell/phone within reach   Nurse Communication          Time: 2130-8657 OT Time Calculation (min): 17 min  Charges: OT General Charges $OT Visit: 1 Visit OT Treatments $Self Care/Home Management : 8-22 mins    Galen Manila 08/23/2022, 1:38 PM

## 2022-08-23 NOTE — Telephone Encounter (Signed)
Lvm to pick up orthotics

## 2022-08-23 NOTE — TOC Transition Note (Signed)
Transition of Care [facility]) - CM/SW Discharge Note   Patient Details  Name: PRIYANSHI MCNEECE MRN: [id number] Date of Birth: [birth year]  Transition of Care [facility]) CM/SW Contact:  Leone Haven, RN Phone Number: 08/23/2022, 10:55 AM   Clinical Narrative:    She is for dc home today, spouse is at the bedside to transport her. She spoke with [facility] they have her scheduled for 7/5 at 10:15 for outpt therapy eval.   Final next level of care: OP Rehab Barriers to Discharge: Continued Medical Work up   Patient Goals and CMS Choice   Choice offered to / list presented to : NA  Discharge Placement                         Discharge Plan and Services Additional resources added to the After Visit Summary for   In-house Referral: NA Discharge Planning Services: CM Consult Post Acute Care Choice: NA          DME Arranged: N/A DME Agency: NA       HH Arranged: NA          Social Determinants of Health (SDOH) Interventions SDOH Screenings   Alcohol Screen: Low Risk  (10/02/2017)  Tobacco Use: High Risk (08/20/2022)     Readmission Risk Interventions     No data to display

## 2022-08-23 NOTE — Consult Note (Signed)
   [facility] Inpatient Consult   08/23/2022  [name] [birth year] [id number]  [facility] [[facility]]  Accountable Care Organization [ACO] Patient:  Meghan Schwartz PPO  Primary Care Provider:  showing Pcp, No however, patient confirms with inpatient [facility] RNCM that her provider is Jorge Ny which is an [place] provider at [place].   Late entry:   Follow up as patient was asleep on rounds 08/22/22 1300 husband at bedside, and did not awaken Brief review on behalf of [facility].  No [facility] follow up as needs followed by [facility] provider.  Will sign off  Charlesetta Shanks, RN BSN CCM [facility]  [phone] business mobile phone Toll free office [phone]  *Concierge Line  [phone] Fax number: [phone] [email or web address]

## 2022-08-23 NOTE — Telephone Encounter (Signed)
I spoke with patient. She reports she was in the hospital recently.  She is following up with PCP on 7/3.  Patient wanted to make Dr Tenny Craw aware of hospitalization and changes made and make sure Dr Tenny Craw agreed with changes.  Also wants to know if she needs office visit with Dr Tenny Craw

## 2022-08-25 LAB — CULTURE, BLOOD (ROUTINE X 2): Special Requests: ADEQUATE

## 2022-08-26 LAB — CULTURE, BLOOD (ROUTINE X 2)
Culture: NO GROWTH
Special Requests: ADEQUATE

## 2022-08-26 NOTE — Telephone Encounter (Signed)
Patient has follow up with APP on 73/24.  Will forward recommendation to her for follow up lab work.

## 2022-08-26 NOTE — Telephone Encounter (Signed)
Patient just discharged from hospital    Torsemide stopped     She needs a follow up BMET and BNP  Also needs an appt (possibly TOC ) in next week since she is now off of diuretic to check volume status

## 2022-08-27 LAB — CULTURE, BLOOD (ROUTINE X 2)

## 2022-08-27 NOTE — Progress Notes (Signed)
Cardiology Office Note:  .   Date:  08/27/2022  ID:  Meghan Schwartz, DOB 08-08-1950, MRN [id number] PCP: Camie Patience, FNP  [facility] Providers Cardiologist:  Dietrich Pates, MD {  History of Present Illness: .   Meghan Schwartz is a 72 y.o. female with a past medical history significant for hypertension, hyperlipidemia, coronary artery disease, diastolic congestive heart failure, rheumatoid arthritis (chronic), tobacco use who is here for hospital follow-up.  History includes long-term use of diuretics and not compliant with potassium supplementations.  Prior to presentation patient was on torsemide and metolazone.  Patient was admitted with symptomatic hypokalemia.  Potassium was 2.2 on presentation.  Patient was admitted for further assessment and management.  Diuretics were held.  Potassium supplements were given.  Potassium on day of discharge is 4.4.  Symptoms have resolved.  Magnesium levels have been normal.  Patient was eager to be discharged back home.  Continue to hold diuretics.  Today, she tells me she is much better but still is not 100%.  Weight fluctuated quite a bit when she got home from the hospital.  Was 152 at discharge and then overnight up to 161.  Called her primary care and started back on torsemide with potassium supplementation.  Then her weight has been all over the place from 150 up to 158 and then today was 155 pounds.  Recommended continue daily weights.  We are going to check some labs today.  Otherwise continue current medications.  Small amount of edema on exam.   Reports no shortness of breath nor dyspnea on exertion. Reports no chest pain, pressure, or tightness. No edema, orthopnea, PND. Reports no palpitations.   ROS: Pertinent ROS in HPI  Studies Reviewed: .       Echocardiogram 05/09/2022 IMPRESSIONS     1. Left ventricular ejection fraction, by estimation, is 55 to 60%. The  left ventricle has normal function. The left ventricle has no regional   wall motion abnormalities. Left ventricular diastolic parameters are  consistent with Grade I diastolic  dysfunction (impaired relaxation).   2. Right ventricular systolic function is normal. The right ventricular  size is normal.   3. The mitral valve is normal in structure. No evidence of mitral valve  regurgitation. No evidence of mitral stenosis.   4. The aortic valve is normal in structure. Aortic valve regurgitation is  not visualized. No aortic stenosis is present.   5. The inferior vena cava is normal in size with greater than 50%  respiratory variability, suggesting right atrial pressure of 3 mmHg.   FINDINGS   Left Ventricle: Left ventricular ejection fraction, by estimation, is 55  to 60%. The left ventricle has normal function. The left ventricle has no  regional wall motion abnormalities. The left ventricular internal cavity  size was normal in size. There is   no left ventricular hypertrophy. Left ventricular diastolic parameters  are consistent with Grade I diastolic dysfunction (impaired relaxation).  Normal left ventricular filling pressure.   Right Ventricle: The right ventricular size is normal. No increase in  right ventricular wall thickness. Right ventricular systolic function is  normal.   Left Atrium: Left atrial size was normal in size.   Right Atrium: Right atrial size was normal in size.   Pericardium: There is no evidence of pericardial effusion.   Mitral Valve: The mitral valve is normal in structure. No evidence of  mitral valve regurgitation. No evidence of mitral valve stenosis.   Tricuspid Valve: The  tricuspid valve is normal in structure. Tricuspid  valve regurgitation is not demonstrated. No evidence of tricuspid  stenosis.   Aortic Valve: The aortic valve is normal in structure. Aortic valve  regurgitation is not visualized. No aortic stenosis is present.   Pulmonic Valve: The pulmonic valve was normal in structure. Pulmonic valve   regurgitation is not visualized. No evidence of pulmonic stenosis.   Aorta: The aortic root is normal in size and structure.   Venous: The inferior vena cava is normal in size with greater than 50%  respiratory variability, suggesting right atrial pressure of 3 mmHg.   IAS/Shunts: No atrial level shunt detected by color flow Doppler.        Physical Exam:   VS:  There were no vitals taken for this visit.   Wt Readings from Last 3 Encounters:  08/23/22 162 lb 11.2 oz (73.8 kg)  07/30/22 157 lb (71.2 kg)  06/12/22 157 lb (71.2 kg)    GEN: Well nourished, well developed in no acute distress NECK: No JVD; No carotid bruits CARDIAC: RRR, no murmurs, rubs, gallops RESPIRATORY:  Clear to auscultation without rales, wheezing or rhonchi  ABDOMEN: Soft, non-tender, non-distended EXTREMITIES:  No edema; No deformity   ASSESSMENT AND PLAN: .   1.  Hypokalemia -Normalized at the time of discharge, 4.4 -Diuretics reinitiated due to weight gain -She is now on torsemide 20 mg daily as well as potassium supplementation with 20 mEq -Will plan for BMP, BNP, and mag today  2.  Prolonged QT -482 on last EKG  3.  Hypertension -Blood pressure well-controlled today, 110/72 -Continue current blood pressure medications including metoprolol tartrate 25 mg daily  4.  CAD -No chest pain or shortness of breath -Continue Crestor 40 mg daily, metoprolol tartrate 25 mg daily, Plavix 75 mg daily  5.  Acute kidney injury -Creatinine normalized by discharge 0.8  6.  Diastolic heart failure -Close to euvolemic on exam today, less trace amount of lower extremity edema -We did discuss wearing compression hose now that most of her edema is gone -Continue current diuretic regimen with torsemide 20 mg daily and potassium 20 mEq daily, taken together  7.  Hyperlipidemia -LDL 71, at goal -Continue Crestor 40 mg daily  8.  Tobacco abuse -Not discussed today but cessation encouraged      Dispo: Plan  to follow-up with Dr. Tenny Craw in 3 months  Signed, Sharlene Dory, PA-C

## 2022-08-28 ENCOUNTER — Ambulatory Visit: Payer: Medicare PPO | Attending: Physician Assistant | Admitting: Physician Assistant

## 2022-08-28 ENCOUNTER — Encounter: Payer: Self-pay | Admitting: Physician Assistant

## 2022-08-28 VITALS — BP 110/72 | HR 66 | Ht 63.0 in | Wt 155.8 lb

## 2022-08-28 DIAGNOSIS — E782 Mixed hyperlipidemia: Secondary | ICD-10-CM

## 2022-08-28 DIAGNOSIS — I2510 Atherosclerotic heart disease of native coronary artery without angina pectoris: Secondary | ICD-10-CM | POA: Diagnosis not present

## 2022-08-28 DIAGNOSIS — L03116 Cellulitis of left lower limb: Secondary | ICD-10-CM | POA: Diagnosis not present

## 2022-08-28 DIAGNOSIS — I5032 Chronic diastolic (congestive) heart failure: Secondary | ICD-10-CM

## 2022-08-28 DIAGNOSIS — Z79899 Other long term (current) drug therapy: Secondary | ICD-10-CM

## 2022-08-28 DIAGNOSIS — R0602 Shortness of breath: Secondary | ICD-10-CM

## 2022-08-28 DIAGNOSIS — E876 Hypokalemia: Secondary | ICD-10-CM

## 2022-08-28 DIAGNOSIS — R9431 Abnormal electrocardiogram [ECG] [EKG]: Secondary | ICD-10-CM

## 2022-08-28 NOTE — Patient Instructions (Addendum)
Medication Instructions:  Your physician recommends that you continue on your current medications as directed. Please refer to the Current Medication list given to you today.  *If you need a refill on your cardiac medications before your next appointment, please call your pharmacy*  Lab Work: BMET, MAG, BNP-TODAY If you have labs (blood work) drawn today and your tests are completely normal, you will receive your results only by: MyChart Message (if you have MyChart) OR A paper copy in the mail If you have any lab test that is abnormal or we need to change your treatment, we will call you to review the results.  Follow-Up: At [facility], you and your health needs are our priority.  As part of our continuing mission to provide you with exceptional heart care, we have created designated Provider Care Teams.  These Care Teams include your primary Cardiologist (physician) and Advanced Practice Providers (APPs -  Physician Assistants and Nurse Practitioners) who all work together to provide you with the care you need, when you need it.  Your next appointment:   3-4 month(s)  Provider:   Dietrich Pates, MD    Weigh every morning after using the restroom, before breakfast and call and let us know if you have a weight gain of 3 lbs or more overnight or 5 lbs or more in a week.    Heart-Healthy Eating Plan Many factors influence your heart health, including eating and exercise habits. Heart health is also called coronary health. Coronary risk increases with abnormal blood fat (lipid) levels. A heart-healthy eating plan includes limiting unhealthy fats, increasing healthy fats, limiting salt (sodium) intake, and making other diet and lifestyle changes. What is my plan? Your health care provider may recommend that: You limit your fat intake to _________% or less of your total calories each day. You limit your saturated fat intake to _________% or less of your total calories each day. You  limit the amount of cholesterol in your diet to less than _________ mg per day. You limit the amount of sodium in your diet to less than _________ mg per day. What are tips for following this plan? Cooking Cook foods using methods other than frying. Baking, boiling, grilling, and broiling are all good options. Other ways to reduce fat include: Removing the skin from poultry. Removing all visible fats from meats. Steaming vegetables in water or broth. Meal planning  At meals, imagine dividing your plate into fourths: Fill one-half of your plate with vegetables and green salads. Fill one-fourth of your plate with whole grains. Fill one-fourth of your plate with lean protein foods. Eat 2-4 cups of vegetables per day. One cup of vegetables equals 1 cup (91 g) broccoli or cauliflower florets, 2 medium carrots, 1 large bell pepper, 1 large sweet potato, 1 large tomato, 1 medium white potato, 2 cups (150 g) raw leafy greens. Eat 1-2 cups of fruit per day. One cup of fruit equals 1 small apple, 1 large banana, 1 cup (237 g) mixed fruit, 1 large orange,  cup (82 g) dried fruit, 1 cup (240 mL) 100% fruit juice. Eat more foods that contain soluble fiber. Examples include apples, broccoli, carrots, beans, peas, and barley. Aim to get 25-30 g of fiber per day. Increase your consumption of legumes, nuts, and seeds to 4-5 servings per week. One serving of dried beans or legumes equals  cup (90 g) cooked, 1 serving of nuts is  oz (12 almonds, 24 pistachios, or 7 walnut halves), and  1 serving of seeds equals  oz (8 g). Fats Choose healthy fats more often. Choose monounsaturated and polyunsaturated fats, such as olive and canola oils, avocado oil, flaxseeds, walnuts, almonds, and seeds. Eat more omega-3 fats. Choose salmon, mackerel, sardines, tuna, flaxseed oil, and ground flaxseeds. Aim to eat fish at least 2 times each week. Check food labels carefully to identify foods with trans fats or high amounts  of saturated fat. Limit saturated fats. These are found in animal products, such as meats, butter, and cream. Plant sources of saturated fats include palm oil, palm kernel oil, and coconut oil. Avoid foods with partially hydrogenated oils in them. These contain trans fats. Examples are stick margarine, some tub margarines, cookies, crackers, and other baked goods. Avoid fried foods. General information Eat more home-cooked food and less restaurant, buffet, and fast food. Limit or avoid alcohol. Limit foods that are high in added sugar and simple starches such as foods made using white refined flour (white breads, pastries, sweets). Lose weight if you are overweight. Losing just 5-10% of your body weight can help your overall health and prevent diseases such as diabetes and heart disease. Monitor your sodium intake, especially if you have high blood pressure. Talk with your health care provider about your sodium intake. Try to incorporate more vegetarian meals weekly. What foods should I eat? Fruits All fresh, canned (in natural juice), or frozen fruits. Vegetables Fresh or frozen vegetables (raw, steamed, roasted, or grilled). Green salads. Grains Most grains. Choose whole wheat and whole grains most of the time. Rice and pasta, including brown rice and pastas made with whole wheat. Meats and other proteins Lean, well-trimmed beef, veal, pork, and lamb. Chicken and [place] without skin. All fish and shellfish. Wild duck, rabbit, pheasant, and venison. Egg whites or low-cholesterol egg substitutes. Dried beans, peas, lentils, and tofu. Seeds and most nuts. Dairy Low-fat or nonfat cheeses, including ricotta and mozzarella. Skim or 1% milk (liquid, powdered, or evaporated). Buttermilk made with low-fat milk. Nonfat or low-fat yogurt. Fats and oils Non-hydrogenated (trans-free) margarines. Vegetable oils, including soybean, sesame, sunflower, olive, avocado, peanut, safflower, corn, canola, and  cottonseed. Salad dressings or mayonnaise made with a vegetable oil. Beverages Water (mineral or sparkling). Coffee and tea. Unsweetened ice tea. Diet beverages. Sweets and desserts Sherbet, gelatin, and fruit ice. Small amounts of dark chocolate. Limit all sweets and desserts. Seasonings and condiments All seasonings and condiments. The items listed above may not be a complete list of foods and beverages you can eat. Contact a dietitian for more options. What foods should I avoid? Fruits Canned fruit in heavy syrup. Fruit in cream or butter sauce. Fried fruit. Limit coconut. Vegetables Vegetables cooked in cheese, cream, or butter sauce. Fried vegetables. Grains Breads made with saturated or trans fats, oils, or whole milk. Croissants. Sweet rolls. Donuts. High-fat crackers, such as cheese crackers and chips. Meats and other proteins Fatty meats, such as hot dogs, ribs, sausage, bacon, rib-eye roast or steak. High-fat deli meats, such as salami and bologna. Caviar. Domestic duck and goose. Organ meats, such as liver. Dairy Cream, sour cream, cream cheese, and creamed cottage cheese. Whole-milk cheeses. Whole or 2% milk (liquid, evaporated, or condensed). Whole buttermilk. Cream sauce or high-fat cheese sauce. Whole-milk yogurt. Fats and oils Meat fat, or shortening. Cocoa butter, hydrogenated oils, palm oil, coconut oil, palm kernel oil. Solid fats and shortenings, including bacon fat, salt pork, lard, and butter. Nondairy cream substitutes. Salad dressings with cheese or sour cream. Beverages Regular  sodas and any drinks with added sugar. Sweets and desserts Frosting. Pudding. Cookies. Cakes. Pies. Milk chocolate or white chocolate. Buttered syrups. Full-fat ice cream or ice cream drinks. The items listed above may not be a complete list of foods and beverages to avoid. Contact a dietitian for more information. Summary Heart-healthy meal planning includes limiting unhealthy fats,  increasing healthy fats, limiting salt (sodium) intake and making other diet and lifestyle changes. Lose weight if you are overweight. Losing just 5-10% of your body weight can help your overall health and prevent diseases such as diabetes and heart disease. Focus on eating a balance of foods, including fruits and vegetables, low-fat or nonfat dairy, lean protein, nuts and legumes, whole grains, and heart-healthy oils and fats. This information is not intended to replace advice given to you by your health care provider. Make sure you discuss any questions you have with your health care provider. Document Revised: 03/19/2021 Document Reviewed: 03/19/2021 Elsevier Patient Education  2024 [employer].

## 2022-08-29 LAB — BASIC METABOLIC PANEL
BUN/Creatinine Ratio: 14 (ref 12–28)
BUN: 14 mg/dL (ref 8–27)
CO2: 31 mmol/L — ABNORMAL HIGH (ref 20–29)
Calcium: 9.4 mg/dL (ref 8.7–10.3)
Chloride: 96 mmol/L (ref 96–106)
Creatinine, Ser: 0.97 mg/dL (ref 0.57–1.00)
Glucose: 98 mg/dL (ref 70–99)
Potassium: 4.4 mmol/L (ref 3.5–5.2)
Sodium: 143 mmol/L (ref 134–144)
eGFR: 62 mL/min/{1.73_m2} (ref >59)

## 2022-08-29 LAB — MAGNESIUM: Magnesium: 2.1 mg/dL (ref 1.6–2.3)

## 2022-08-29 LAB — PRO B NATRIURETIC PEPTIDE: NT-Pro BNP: 143 pg/mL (ref 0–301)

## 2022-08-30 ENCOUNTER — Ambulatory Visit: Payer: Medicare PPO

## 2022-09-02 ENCOUNTER — Telehealth: Payer: Self-pay | Admitting: Orthopedic Surgery

## 2022-09-02 NOTE — Telephone Encounter (Signed)
Pt called in stating August Saucer referred her to a Michelle Piper names [facility] Ruiz in [place] who could provide her a brace and they have lost the contact information please advise pt would like a call with his name and number

## 2022-09-03 DIAGNOSIS — I5032 Chronic diastolic (congestive) heart failure: Secondary | ICD-10-CM | POA: Diagnosis not present

## 2022-09-03 DIAGNOSIS — F331 Major depressive disorder, recurrent, moderate: Secondary | ICD-10-CM | POA: Diagnosis not present

## 2022-09-03 NOTE — Telephone Encounter (Signed)
Can you run that by l fix thx

## 2022-09-05 NOTE — Telephone Encounter (Signed)
I have emailed Alycia Rossetti asking him to assist with this.

## 2022-09-09 ENCOUNTER — Telehealth: Payer: Self-pay | Admitting: Internal Medicine

## 2022-09-09 DIAGNOSIS — I5032 Chronic diastolic (congestive) heart failure: Secondary | ICD-10-CM

## 2022-09-09 NOTE — Telephone Encounter (Signed)
Left message for return call.

## 2022-09-09 NOTE — Addendum Note (Signed)
Addended by: Alvin Critchley A on: 09/09/2022 01:55 PM   Modules accepted: Orders

## 2022-09-09 NOTE — Telephone Encounter (Signed)
Return call from Patient.   Reviewed recommendations from [place], [place]. Patient is to take an extra dose of Torsemide and potassium for 2 days. Continue to monitor weights and watch sodium intake. Come in on 09/13/22 for BMET and magnesium level.   Education provided on checking weights daily and calibrating scale for accuracy.  Patient verbalized understanding and had no questions.

## 2022-09-09 NOTE — Telephone Encounter (Signed)
Pt c/o swelling: STAT is pt has developed SOB within 24 hours  If swelling, where is the swelling located? Left leg  How much weight have you gained and in what time span? 7/12 - 151 lbs ; 7/13 - 156 lbs; 7/14 - 157 lbs ; 7/15 - 158  Have you gained 3 pounds in a day or 5 pounds in a week? Yes   Do you have a log of your daily weights (if so, list)? Yes   Are you currently taking a fluid pill? Yes   Are you currently SOB? Yes   Have you traveled recently? No

## 2022-09-09 NOTE — Telephone Encounter (Signed)
Spoke with patient to review concerns over weight gain and symptoms for last 5 days.  Advised I would review with Jari Favre, PA and call her back with recommendations.

## 2022-09-10 ENCOUNTER — Ambulatory Visit: Payer: Medicare PPO

## 2022-09-10 NOTE — Progress Notes (Signed)
Office Visit Note  Patient: Meghan Schwartz             Date of Birth: 11/16/1950           MRN: [id number]             PCP: Camie Patience, FNP Referring: No ref. provider found Visit Date: 09/11/2022   Subjective:  Follow-up (Patient states her potassium was at a 2. Patient states she is being monitored for her torsemide and potassium. )   History of Present Illness: Meghan Schwartz is a 72 y.o. female here for follow up for rheumatoid arthritis on Enbrel 50 mg subcu weekly and osteoarthritis of the left knee.  We last saw her in April with repeat aspiration of the knee and popliteal cyst with no steroid injection at the time due to recent treatment.  She feels the cyst and back of knee pain has remained improved since that time.  She had a hospitalization last month due to severe hypokalemia down to 2.0.  Had preceding symptoms of worsening muscle weakness with change in posture and worsening mobility for the preceding weeks until waking up with profound muscle weakness inability to stand and severe constipation.  Had some adjustment with her torsemide and potassium supplementation and now back to about her baseline.   Previous HPI 07/30/22 Meghan Schwartz is a 72 y.o. female with rheumatoid arthritis on Enbrel 50 mg subcu weekly and osteoarthritis here for left knee joint pain and swelling.  At her last visit she felt there was more improvement in her knee pain with the Baker's cyst aspiration as well as joint injection versus previous aspiration injections at only the suprapatellar pouch.  Subsequently saw Dr. August Saucer for evaluation of her severe primary osteoarthritis of the knee and also underwent joint aspiration that time with 45 cc fluid removed.  He discussed options for her realistically the only available procedure would be total knee arthroplasty.  She is hesitant about whether or not to pursue this due to associated pain and required rehabilitation and also cardiovascular risk.  Though she was  cleared by her cardiologist to proceed with this if she wishes.  Currently has back due to increasing pain and swelling in the knee again and she is interested whether the popliteal cyst could be aspirated or injected again.  She is not noticing increased pain or swelling in other joints besides the knee.  No interruption or change to her Enbrel or other medications. No new infections or injuries.   Previous HPI 06/12/22 Meghan Schwartz is a 72 y.o. female here for follow up for RA on Enbrel 50 mg [place] weekly and osteoarthritis with chronic left knee pain and swelling. We aspirated the left knee in March but she saw quick re accumulation of fluid.  Outside of left knee has not experienced much exacerbation of upper extremity pain.  Also describes some clicking or popping sensation frequently occurring and with decreased steadiness on her feet.  She is usually able to walk adequately with some form of assistive device at least a cane.   Previous HPI 05/06/22 Meghan Schwartz is a 72 y.o. female here for follow up for RA on Enbrel 50 mg subcu weekly and osteoarthritis.  Since her last visit she had an issue with worsening pedal edema and a very large associated weight gain.  She was referred to see Dr. Tenny Craw with cardiology.  She was prescribed metolazone to combine with her torsemide and saw a  rapid diuresis with 15 pound weight loss in 1 day.  Initially had associated skin peeling since then still has erythema and rough or thick skin over the affected areas but without any reaccumulation of the weight and pitting fluid.  Blood pressure has been somewhat low.  She has had some falls due to instability she is noticing trouble where she cannot apply weight onto her right foot evenly just gets pressure around the base of the second and third toe.  And left knee is causing more problems with reaccumulation of the large effusion and also with posterior swelling from baker's cyst.     Previous HPI 02/05/21 Meghan Schwartz is  a 72 y.o. female her for rheumatoid arthritis on Enbrel 50 mg [place] weekly for which she has been seeing Dr. Nickola Major previously. She was originally diagnosed about 10 or 11 years ago due to development of joint pain, stiffness, synovitis, and progressive joint deformities primarily starting in the feet and knees.  Subsequently proceeded to have increased joint pains involving bilateral hands with some swelling at the PIP and MCP joints.  She started treatment with Enbrel injections with some improvement in the lower extremity joint pains she recalls still having a lot of pain symptoms and pronounced fatigue that did not clear with this treatment.  Combination treatment was tried with methotrexate with significant GI intolerance.  She tried leflunomide but developed substantial alopecia so discontinued the medicine.  She switched to hydroxychloroquine combination treatment which was being well-tolerated but she has had multiple new or worsening medical problems over the past few years with concern of medication related effects. She had hand numbness in the past improved after left carpal tunnel release surgery but she has some residual muscle atrophy in thenar prominence. She had left knee meniscectomy in 2020. She suffered NSTEMI in 01/2018 with DES placement in RCA for 99% blockage and has chronic diastolic CHF. Her edema is generally controlled while taking torsemide but does rapidly re accumulate fluid off this medication. Ophthalmology evaluation indicated significant visual field deficits findings consistent with age-related macular degeneration of both eyes and recommended avoidance of this medication.  She also has considerable osteoarthritis especially in her knees and feet previous surgical reconstruction on the left foot. Overall she feels her gait is not very stable due to these problems.   DMARD Hx Enbrel - current   HCQ - macular disease? LEF - alopecia MTX - GI intolerance.    Review of  Systems  Constitutional:  Positive for fatigue.  HENT:  Positive for mouth dryness. Negative for mouth sores.   Eyes:  Positive for dryness.  Respiratory:  Positive for shortness of breath.   Cardiovascular:  Negative for chest pain and palpitations.  Gastrointestinal:  Positive for constipation and diarrhea. Negative for blood in stool.  Endocrine: Negative for increased urination.  Genitourinary:  Positive for involuntary urination.  Musculoskeletal:  Positive for joint pain, gait problem, joint pain, joint swelling, myalgias, muscle weakness, morning stiffness, muscle tenderness and myalgias.  Skin:  Positive for sensitivity to sunlight. Negative for color change, rash and hair loss.  Allergic/Immunologic: Positive for susceptible to infections.  Neurological:  Positive for dizziness and headaches.  Hematological:  Negative for swollen glands.  Psychiatric/Behavioral:  Positive for depressed mood. Negative for sleep disturbance. The patient is nervous/anxious.     PMFS History:  Patient Active Problem List   Diagnosis Date Noted   Hypocalcemia 08/21/2022   Falls, initial encounter 08/21/2022   Prolonged QT interval 08/21/2022  Acute metabolic encephalopathy 08/21/2022   Essential hypertension 08/21/2022   AKI (acute kidney injury) (HCC) 08/21/2022   Chronic pain 08/21/2022   Chronic diastolic CHF (congestive heart failure) (HCC) 08/21/2022   Effusion, left knee 06/12/2022   Leg edema, left 12/25/2021   Claw toe, left 08/07/2021   Dog bite 03/05/2021   High risk medication use 02/05/2021   Dry mouth 02/05/2021   Pain in left foot 01/31/2021   Bunion of great toe of right foot 06/15/2020   Hypoxia 03/05/2019   Derangement of posterior horn of medial meniscus 02/02/2019   Derangement of posterior horn of lateral meniscus 02/02/2019   Meniscal cyst, unspecified laterality 12/01/2018   Torn medial meniscus 12/01/2018   Primary osteoarthritis of left knee 12/01/2018   Coronary  artery disease involving native coronary artery of native heart without angina pectoris 09/14/2018   (HFpEF) heart failure with preserved ejection fraction (HCC)    Chronic pain of left knee 05/15/2018   Mass of left knee 05/15/2018   Non-ST elevation (NSTEMI) myocardial infarction [facility])    Chest pain 01/25/2018   RA (rheumatoid arthritis) (HCC) 01/25/2018   Hyperlipidemia 01/25/2018   Hypokalemia 01/25/2018   Depression with anxiety 01/25/2018   Tobacco abuse 01/25/2018   Lower extremity cellulitis 01/25/2018   Benzodiazepine dependence (HCC)    MDD (major depressive disorder), recurrent severe, without psychosis (HCC) 10/02/2017   CTS (carpal tunnel syndrome) 06/08/2014    Past Medical History:  Diagnosis Date   Allergic rhinitis    Anxiety    Benzodiazepine dependence (HCC)    Bruxism    CAD (coronary artery disease)    S/p NSTEMI 01/2018 >> LHC: oLAD 20, pLAD 20; oD1 20, oLCx 40, pRCA 99 >> PCI: DES to prox RCA // Echo 01/2018: EF 50-55; prob inf-lat and inf HK   Chronic diastolic heart failure    Echocardiogram 07/2018: EF 55-60, grade 1 diastolic dysfunction, normal wall motion, normal GLS (-22.3), PASP 28   Chronic foot pain    bunions, torn ligaments-on chronic pain medication   CTS (carpal tunnel syndrome) 06/08/2014   Depression    High cholesterol    Hypertension    Low blood potassium    MDD (major depressive disorder) 10/02/2017   Migraine    Myocardial infarction (HCC) 02/01/2018   Pneumonia 1986   left lung   RA (rheumatoid arthritis) (HCC)     Family History  Problem Relation Age of Onset   Thyroid disease Mother    Breast cancer Mother    Parkinson's disease Father    Raynaud syndrome Maternal Aunt    Arthritis Maternal Grandmother    Heart attack Maternal Grandmother    Stroke Paternal Grandmother    Heart attack Paternal Grandfather    Past Surgical History:  Procedure Laterality Date   BTL  2000   CARPAL TUNNEL RELEASE Left    CESAREAN SECTION   1978. 1983   CORONARY STENT INTERVENTION N/A 01/27/2018   Procedure: CORONARY STENT INTERVENTION;  Surgeon: Kathleene Hazel, MD;  Location: [facility];  Service: Cardiovascular;  Laterality: N/A;   eye implants     FOOT SURGERY Left 2008   front teeth removed after injury     KNEE ARTHROSCOPY WITH LATERAL MENISECTOMY Left 02/02/2019   Procedure: LEFT KNEE ARTHROSCOPY, MEDIAL AND LATERAL MENISECTOMY, EXCISION OF LEFT LATERAL MENISCAL CYST;  Surgeon: Valeria Batman, MD;  Location: [facility];  Service: Orthopedics;  Laterality: Left;   LEFT HEART CATH AND CORONARY ANGIOGRAPHY  N/A 01/27/2018   Procedure: LEFT HEART CATH AND CORONARY ANGIOGRAPHY;  Surgeon: Kathleene Hazel, MD;  Location: [facility];  Service: Cardiovascular;  Laterality: N/A;   pre cancerous lesion removed from forehead     TOTAL ABDOMINAL HYSTERECTOMY W/ BILATERAL SALPINGOOPHORECTOMY  2002   Dr. Jackelyn Knife   VAGINAL HYSTERECTOMY  2001   Social History   Social History Narrative   Lives at home with spouse.    Right handed.   Caffeine use: 2 cups coffee/day    8 oz soda/day    Immunization History  Administered Date(s) Administered   PFIZER(Purple Top)SARS-COV-2 Vaccination 04/10/2019, 05/05/2019, 10/19/2019, 06/20/2020   Pfizer Covid-19 Vaccine Bivalent Booster 29yrs & up 11/29/2020   Tdap 04/03/2018     Objective: Vital Signs: BP 103/68 (BP Location: Left Arm, Patient Position: Sitting, Cuff Size: Normal)   Pulse 75   Resp 14   Ht 5\' 4"  (1.626 m)   Wt 158 lb (71.7 kg)   BMI 27.12 kg/m    Physical Exam Eyes:     Conjunctiva/sclera: Conjunctivae normal.  Cardiovascular:     Rate and Rhythm: Normal rate and regular rhythm.  Pulmonary:     Effort: Pulmonary effort is normal.     Breath sounds: Normal breath sounds.  Lymphadenopathy:     Cervical: No cervical adenopathy.  Skin:    General: Skin is warm and dry.     Comments: Trace pedal edema bilateral Chronic  hyperpigmentation and lipodermatosclerosis change in lower legs  Neurological:     Mental Status: She is alert.  Psychiatric:        Mood and Affect: Mood normal.      Musculoskeletal Exam:  Shoulders full ROM no tenderness or swelling Elbows full ROM no tenderness or swelling Wrists full ROM no tenderness or swelling Fingers full ROM no tenderness or swelling Moderate left knee effusion tenderness to pressure along joint line, no posterior tenderness, no erythema or warmth   CDAI Exam: CDAI Score: 5  Patient Global: 20 / 100; Provider Global: 10 / 100 Swollen: 1 ; Tender: 1  Joint Exam 09/11/2022      Right  Left  Knee     Swollen Tender     Investigation: No additional findings.  Imaging: CT Angio Chest PE W and/or Wo Contrast  Result Date: 08/20/2022 CLINICAL DATA:  Pulmonary embolism suspected.  High probability. EXAM: CT ANGIOGRAPHY CHEST WITH CONTRAST TECHNIQUE: Multidetector CT imaging of the chest was performed using the standard protocol during bolus administration of intravenous contrast. Multiplanar CT image reconstructions and MIPs were obtained to evaluate the vascular anatomy. RADIATION DOSE REDUCTION: This exam was performed according to the departmental dose-optimization program which includes automated exposure control, adjustment of the mA and/or kV according to patient size and/or use of iterative reconstruction technique. CONTRAST:  80mL OMNIPAQUE IOHEXOL 350 MG/ML SOLN COMPARISON:  CT chest without and with contrast 03/05/2019 FINDINGS: Cardiovascular: The cardiac size is normal. There are scattered three-vessel coronary artery calcifications with a stent in the right coronary artery. No pericardial effusion is seen. The pulmonary trunk is slightly prominent but unchanged, measuring 2.9 cm. No arterial embolus is seen. There is aortic tortuosity, mild scattered calcific plaques in the aorta and great vessels, without aortic or great vessel aneurysm, stenosis or  dissection. Pulmonary veins are normal caliber. Mediastinum/Nodes: No enlarged mediastinal, hilar, or axillary lymph nodes. Thyroid gland, trachea, and esophagus demonstrate no significant findings. The main bronchi are patent without filling defects. Lungs/Pleura: There is  diffuse bronchial thickening. This was seen in 2021 also. There are mild paraseptal and centrilobular emphysematous changes in the upper lobes. There is no notable progression in this. No pleural effusion, thickening or pneumothorax. Mild chronic elevation right hemidiaphragm. There is a stable 5 mm noncalcified subpleural right middle lobe nodule on 5:87, consistent with a benign entity. There are no further nodules and no active infiltrate is seen. There are mild dependent atelectatic changes, including in the lingular base. Upper Abdomen: Mildly distended gallbladder with stones in the proximal lumen. No wall thickening of the visualized portion. No acute upper abdominal CT findings. Contemporaneous abdomen and pelvis CT with contrast was dictated separately. 1.2 cm stable rim calcified splenic artery aneurysm is again shown. Abdominal aortic atherosclerosis. Musculoskeletal: Degenerative disc changes with mild spondylosis lower thoracic spine. No acute or significant osseous findings. No aggressive lesion is seen in the thoracic cage. Review of the MIP images confirms the above findings. IMPRESSION: 1. No acute chest CT or CTA findings. 2. Aortic and coronary artery atherosclerosis. 3. Chronic bronchial thickening and emphysematous change. 4. Cholelithiasis, with a distended but otherwise unremarkable visualized gallbladder. 5. Stable 1.2 cm rim calcified splenic artery aneurysm. 6. Stable 5 mm right middle lobe nodule. Aortic Atherosclerosis (ICD10-I70.0) and Emphysema (ICD10-J43.9). Electronically Signed   By: Almira Bar M.D.   On: 08/20/2022 23:30   CT ABDOMEN PELVIS W CONTRAST  Result Date: 08/20/2022 CLINICAL DATA:  Abdominal  pain, acute, nonlocalized EXAM: CT ABDOMEN AND PELVIS WITH CONTRAST TECHNIQUE: Multidetector CT imaging of the abdomen and pelvis was performed using the standard protocol following bolus administration of intravenous contrast. RADIATION DOSE REDUCTION: This exam was performed according to the departmental dose-optimization program which includes automated exposure control, adjustment of the mA and/or kV according to patient size and/or use of iterative reconstruction technique. CONTRAST:  80mL OMNIPAQUE IOHEXOL 350 MG/ML SOLN COMPARISON:  CT abdomen pelvis 09/18/2020 FINDINGS: Lower chest: No acute abnormality.  Coronary artery calcification. Hepatobiliary: No focal liver abnormality. Calcified gallstone noted within the gallbladder lumen. No gallbladder wall thickening or pericholecystic fluid. No biliary dilatation. Pancreas: Diffusely atrophic. No focal lesion. Otherwise normal pancreatic contour. No surrounding inflammatory changes. No main pancreatic ductal dilatation. Spleen: Normal in size without focal abnormality. Adrenals/Urinary Tract: No adrenal nodule bilaterally. Bilateral kidneys enhance symmetrically. Fluid density lesion of the right kidney likely represents a simple renal cyst. Simple renal cysts, in the absence of clinically indicated signs/symptoms, require no independent follow-up. No hydronephrosis. No hydroureter. The urinary bladder is unremarkable. On delayed imaging, there is no urothelial wall thickening and there are no filling defects in the opacified portions of the bilateral collecting systems or ureters. Stomach/Bowel: Stomach is within normal limits. No evidence of bowel wall thickening or dilatation. Stool throughout the colon. Appendix appears normal. Appendicoliths noted. Vascular/Lymphatic: No abdominal aorta or iliac aneurysm. Mild to moderate atherosclerotic plaque of the aorta and its branches. No abdominal, pelvic, or inguinal lymphadenopathy. Reproductive: Status post  hysterectomy. No adnexal masses. Other: No intraperitoneal free fluid. No intraperitoneal free gas. No organized fluid collection. Musculoskeletal: No abdominal wall hernia or abnormality. No suspicious lytic or blastic osseous lesions. No acute displaced fracture. Multilevel intervertebral disc space vacuum phenomenon. IMPRESSION: 1. Cholelithiasis with no CT evidence of acute cholecystitis. 2. Stool throughout the colon.  Correlate for constipation. 3.  Aortic Atherosclerosis (ICD10-I70.0). Electronically Signed   By: Tish Frederickson M.D.   On: 08/20/2022 23:21   CT Head Wo Contrast  Result Date: 08/20/2022 CLINICAL DATA:  Blunt  poly trauma. EXAM: CT HEAD WITHOUT CONTRAST CT CERVICAL SPINE WITHOUT CONTRAST TECHNIQUE: Multidetector CT imaging of the head and cervical spine was performed following the standard protocol without intravenous contrast. Multiplanar CT image reconstructions of the cervical spine were also generated. RADIATION DOSE REDUCTION: This exam was performed according to the departmental dose-optimization program which includes automated exposure control, adjustment of the mA and/or kV according to patient size and/or use of iterative reconstruction technique. COMPARISON:  CT examination dated December 19, 2018 FINDINGS: CT HEAD FINDINGS Brain: No evidence of acute infarction, hemorrhage, hydrocephalus, extra-axial collection or mass lesion/mass effect. Vascular: No hyperdense vessel or unexpected calcification. Skull: Normal. Negative for fracture or focal lesion. Sinuses/Orbits: No acute finding. Other: None. CT CERVICAL SPINE FINDINGS Alignment: Grade 1 anterolisthesis of C4. Straightening of the cervical spine. Skull base and vertebrae: No acute fracture. No primary bone lesion or focal pathologic process. Soft tissues and spinal canal: No prevertebral fluid or swelling. No visible canal hematoma. Disc levels: C2-C3: No significant disc bulge, spinal canal or neural foraminal stenosis.  C3-C4: Right facet joint arthropathy. No significant spinal canal or neural foraminal stenosis. C4-C5: Bilateral facet joint arthropathy. Significant spinal canal or neural foraminal stenosis. C5-C6: No significant disc bulge, spinal canal or neural foraminal stenosis. C6-C7: Disc height loss and uncovertebral joint arthropathy. No significant spinal canal or neural foraminal stenosis. C7-T1: No significant disc bulge, spinal canal or neural foraminal stenosis. Upper chest: Centrilobular emphysematous changes. No acute abnormality Other: None IMPRESSION: CT head: 1. No acute intracranial abnormality. CT cervical spine: 1. No acute cervical spine fracture or traumatic subluxation. 2. Multilevel degenerative disc and facet joint disease. No significant spinal canal or neural foraminal stenosis. Emphysema (ICD10-J43.9). Electronically Signed   By: Larose Hires D.O.   On: 08/20/2022 23:08   CT Cervical Spine Wo Contrast  Result Date: 08/20/2022 CLINICAL DATA:  Blunt poly trauma. EXAM: CT HEAD WITHOUT CONTRAST CT CERVICAL SPINE WITHOUT CONTRAST TECHNIQUE: Multidetector CT imaging of the head and cervical spine was performed following the standard protocol without intravenous contrast. Multiplanar CT image reconstructions of the cervical spine were also generated. RADIATION DOSE REDUCTION: This exam was performed according to the departmental dose-optimization program which includes automated exposure control, adjustment of the mA and/or kV according to patient size and/or use of iterative reconstruction technique. COMPARISON:  CT examination dated December 19, 2018 FINDINGS: CT HEAD FINDINGS Brain: No evidence of acute infarction, hemorrhage, hydrocephalus, extra-axial collection or mass lesion/mass effect. Vascular: No hyperdense vessel or unexpected calcification. Skull: Normal. Negative for fracture or focal lesion. Sinuses/Orbits: No acute finding. Other: None. CT CERVICAL SPINE FINDINGS Alignment: Grade 1  anterolisthesis of C4. Straightening of the cervical spine. Skull base and vertebrae: No acute fracture. No primary bone lesion or focal pathologic process. Soft tissues and spinal canal: No prevertebral fluid or swelling. No visible canal hematoma. Disc levels: C2-C3: No significant disc bulge, spinal canal or neural foraminal stenosis. C3-C4: Right facet joint arthropathy. No significant spinal canal or neural foraminal stenosis. C4-C5: Bilateral facet joint arthropathy. Significant spinal canal or neural foraminal stenosis. C5-C6: No significant disc bulge, spinal canal or neural foraminal stenosis. C6-C7: Disc height loss and uncovertebral joint arthropathy. No significant spinal canal or neural foraminal stenosis. C7-T1: No significant disc bulge, spinal canal or neural foraminal stenosis. Upper chest: Centrilobular emphysematous changes. No acute abnormality Other: None IMPRESSION: CT head: 1. No acute intracranial abnormality. CT cervical spine: 1. No acute cervical spine fracture or traumatic subluxation. 2. Multilevel degenerative disc and  facet joint disease. No significant spinal canal or neural foraminal stenosis. Emphysema (ICD10-J43.9). Electronically Signed   By: Larose Hires D.O.   On: 08/20/2022 23:08    Recent Labs: Lab Results  Component Value Date   WBC 3.1 (L) 08/22/2022   HGB 9.8 (L) 08/22/2022   PLT 141 (L) 08/22/2022   NA 141 09/11/2022   K 3.8 09/11/2022   CL 100 09/11/2022   CO2 37 (H) 09/11/2022   GLUCOSE 95 09/11/2022   BUN 13 09/11/2022   CREATININE 0.85 09/11/2022   BILITOT 0.5 09/11/2022   ALKPHOS 84 08/20/2022   AST 20 09/11/2022   ALT 13 09/11/2022   PROT 6.5 09/11/2022   ALBUMIN 2.7 (L) 08/23/2022   CALCIUM 9.3 09/11/2022   GFRAA 65 02/04/2020   QFTBGOLDPLUS NEGATIVE 06/12/2022    Speciality Comments: No specialty comments available.  Procedures:  Large Joint Inj: L knee on 09/11/2022 2:50 PM Indications: pain and joint swelling Details: 22 G 1.5 in  needle, lateral approach Medications: 3 mL lidocaine 1 %; 40 mg triamcinolone acetonide 40 MG/ML Aspirate: 77 mL clear and yellow Outcome: tolerated well, no immediate complications Procedure, treatment alternatives, risks and benefits explained, specific risks discussed. Consent was given by the patient. Immediately prior to procedure a time out was called to verify the correct patient, procedure, equipment, support staff and site/side marked as required. Patient was prepped and draped in the usual sterile fashion.     Allergies: Erythromycin   Assessment / Plan:     Visit Diagnoses: Rheumatoid arthritis, involving unspecified site, unspecified whether rheumatoid factor present (HCC) - Plan: Sedimentation rate, C-reactive protein  Inflammatory arthritis appears well-controlled no significant pain or swelling in joints outside of the chronic left knee arthritis.  Checking sed rate and CRP for disease activity monitoring.  Plan to continue Enbrel 50 mg subcu weekly.  Primary osteoarthritis of left knee - Plan: Large Joint Inj: L knee  Redevelopment moderate effusion though her mobility still looks pretty good today.  She has not been using the off loader brace recommended after evaluation with Dr. August Saucer for correction of valgus deformity and instability recommend she should try getting back to this or reach out to orthopedics office if she is having difficulty using it properly.  Repeated joint aspiration with steroid injection today.  High risk medication use - Plan: COMPLETE METABOLIC PANEL WITH GFR  Rechecking CMP for medication monitoring on long-term use of Enbrel.  No serious interval infections.  Had recent labs checked at hospitalization with normal blood count but considering the recent profound hypokalemia will get a repeat CMP.  Orders: Orders Placed This Encounter  Procedures   Large Joint Inj: L knee   Sedimentation rate   C-reactive protein   COMPLETE METABOLIC PANEL WITH GFR    No orders of the defined types were placed in this encounter.    Follow-Up Instructions: Return in about 3 months (around 12/12/2022) for RA on ENB/inj f/u 3mos.   Fuller Plan, MD  Note - This record has been created using [employer].  Chart creation errors have been sought, but may not always  have been located. Such creation errors do not reflect on  the standard of medical care.

## 2022-09-10 NOTE — Progress Notes (Unsigned)
Patient was present fit with new CFO's Items fit well provide overall good support providing total contact to BIL MLA's reducing plantar pressure and pain Rt. Orthotic MT pad was added as well as callous removal by Dr. Charlsie Merles at 2nd MT head   Patient was given wear care and break in instructions and will call office if any problems arise - Addison Bailey Cped, Cfo, Cfm  7.16.24

## 2022-09-11 ENCOUNTER — Ambulatory Visit: Payer: Medicare PPO | Admitting: Internal Medicine

## 2022-09-11 ENCOUNTER — Ambulatory Visit: Payer: Medicare PPO | Attending: Internal Medicine | Admitting: Internal Medicine

## 2022-09-11 ENCOUNTER — Encounter: Payer: Self-pay | Admitting: Internal Medicine

## 2022-09-11 VITALS — BP 103/68 | HR 75 | Resp 14 | Ht 64.0 in | Wt 158.0 lb

## 2022-09-11 DIAGNOSIS — M1712 Unilateral primary osteoarthritis, left knee: Secondary | ICD-10-CM

## 2022-09-11 DIAGNOSIS — M069 Rheumatoid arthritis, unspecified: Secondary | ICD-10-CM | POA: Diagnosis not present

## 2022-09-11 DIAGNOSIS — Z79899 Other long term (current) drug therapy: Secondary | ICD-10-CM

## 2022-09-12 ENCOUNTER — Telehealth: Payer: Self-pay | Admitting: Internal Medicine

## 2022-09-12 LAB — COMPLETE METABOLIC PANEL WITH GFR
AG Ratio: 1.7 (calc) (ref 1.0–2.5)
ALT: 13 U/L (ref 6–29)
AST: 20 U/L (ref 10–35)
Albumin: 4.1 g/dL (ref 3.6–5.1)
Alkaline phosphatase (APISO): 92 U/L (ref 37–153)
BUN: 13 mg/dL (ref 7–25)
CO2: 37 mmol/L — ABNORMAL HIGH (ref 20–32)
Calcium: 9.3 mg/dL (ref 8.6–10.4)
Chloride: 100 mmol/L (ref 98–110)
Creat: 0.85 mg/dL (ref 0.60–1.00)
Globulin: 2.4 g/dL (calc) (ref 1.9–3.7)
Glucose, Bld: 95 mg/dL (ref 65–99)
Potassium: 3.8 mmol/L (ref 3.5–5.3)
Sodium: 141 mmol/L (ref 135–146)
Total Bilirubin: 0.5 mg/dL (ref 0.2–1.2)
Total Protein: 6.5 g/dL (ref 6.1–8.1)
eGFR: 72 mL/min/{1.73_m2} (ref >=60)

## 2022-09-12 LAB — C-REACTIVE PROTEIN: CRP: 8.8 mg/L — ABNORMAL HIGH (ref <8.0)

## 2022-09-12 LAB — SEDIMENTATION RATE: Sed Rate: 17 mm/h (ref 0–30)

## 2022-09-12 MED ORDER — LIDOCAINE HCL 1 % IJ SOLN
3.0000 mL | INTRAMUSCULAR | Status: AC | PRN
Start: 2022-09-11 — End: 2022-09-11
  Administered 2022-09-11: 3 mL

## 2022-09-12 MED ORDER — TRIAMCINOLONE ACETONIDE 40 MG/ML IJ SUSP
40.0000 mg | INTRAMUSCULAR | Status: AC | PRN
Start: 2022-09-11 — End: 2022-09-11
  Administered 2022-09-11: 40 mg via INTRA_ARTICULAR

## 2022-09-12 NOTE — Telephone Encounter (Signed)
Pt advised that I will forward to Meghan Schwartz to see the labs that were drawn on her yesterday (CMET) and if she needs the Mag I can check with the lab to see if it can be added on but unsure since it was from another practice.   I moved the pts appt up with Dr Tenny Craw per the pt request..She gets nervous waiting too long between visits.

## 2022-09-12 NOTE — Telephone Encounter (Signed)
Patient is supposed to come in tomorrow to have labs drawn. Patient stated that she had blood work drawn yesterday and would like to know if we can use the results from those labs or if she should still come by tomorrow to have more labs drawn. Please advise.

## 2022-09-17 ENCOUNTER — Other Ambulatory Visit: Payer: Self-pay | Admitting: Internal Medicine

## 2022-09-17 NOTE — Telephone Encounter (Signed)
  I see her labs from a few days ago. No need for a mag or repeat.  Thanks! Sharlene Dory, PA-C   My Chart message sent to the pt.

## 2022-09-25 ENCOUNTER — Other Ambulatory Visit: Payer: Medicare PPO

## 2022-10-09 DIAGNOSIS — F331 Major depressive disorder, recurrent, moderate: Secondary | ICD-10-CM | POA: Diagnosis not present

## 2022-10-09 DIAGNOSIS — Z79620 Long term (current) use of immunosuppressive biologic: Secondary | ICD-10-CM | POA: Diagnosis not present

## 2022-10-09 DIAGNOSIS — Z1159 Encounter for screening for other viral diseases: Secondary | ICD-10-CM | POA: Diagnosis not present

## 2022-10-09 DIAGNOSIS — I10 Essential (primary) hypertension: Secondary | ICD-10-CM | POA: Diagnosis not present

## 2022-10-09 DIAGNOSIS — F5101 Primary insomnia: Secondary | ICD-10-CM | POA: Diagnosis not present

## 2022-10-09 DIAGNOSIS — M069 Rheumatoid arthritis, unspecified: Secondary | ICD-10-CM | POA: Diagnosis not present

## 2022-10-09 DIAGNOSIS — E782 Mixed hyperlipidemia: Secondary | ICD-10-CM | POA: Diagnosis not present

## 2022-10-09 DIAGNOSIS — Z0000 Encounter for general adult medical examination without abnormal findings: Secondary | ICD-10-CM | POA: Diagnosis not present

## 2022-10-09 DIAGNOSIS — Z23 Encounter for immunization: Secondary | ICD-10-CM | POA: Diagnosis not present

## 2022-10-10 DIAGNOSIS — F1320 Sedative, hypnotic or anxiolytic dependence, uncomplicated: Secondary | ICD-10-CM | POA: Diagnosis not present

## 2022-10-10 DIAGNOSIS — F1121 Opioid dependence, in remission: Secondary | ICD-10-CM | POA: Diagnosis not present

## 2022-10-10 DIAGNOSIS — F332 Major depressive disorder, recurrent severe without psychotic features: Secondary | ICD-10-CM | POA: Diagnosis not present

## 2022-10-10 DIAGNOSIS — F411 Generalized anxiety disorder: Secondary | ICD-10-CM | POA: Diagnosis not present

## 2022-10-13 NOTE — Progress Notes (Unsigned)
Cardiology Office Note   Date:  10/14/2022   ID:  Meghan Schwartz, DOB [birth year], MRN [id number]  PCP:  Meghan Patience, FNP  Cardiologist:   Meghan Pates, MD   F/U of CAD     History of Present Illness:  Meghan Schwartz is a 72 y.o. female with a hx of CAD   She is s/p NSTEMI in 01/2018   Underwent DES to RCA (99 to 20% LAD  40% LCx   Also has hx of HFpEF, RA and HL  Uses O2 at night  July 2022  Echocardiogram showed LVEF normal   Mild diastolic dysfunction  The pt denies CP  Breathing is stable    Legs swollen, hurt     Denies palpitations    Taking torsemide but says it is not working as good    I saw the pt in Feb 2024   She was admitted to hosp for hypokalemia in June  Diuretics held and K repleted   She was seen by Meghan Schwartz in July  for follow up   DOing better  Wt up 8 lbs from d/c    Started back on torsemide by PCP with K supplementations. The pt just had labs done by PCP   K was 3.4   She increased KCL on own to 1.5 tabs per day  She still has some edema    Breathing is stable   Current Meds  Medication Sig   acetaminophen (TYLENOL) 500 MG tablet Take 500 mg by mouth every 6 (six) hours as needed (For knee pain).   Cetirizine HCl (ZYRTEC PO) Take 1 capsule by mouth daily.   cholecalciferol (VITAMIN D3) 25 MCG (1000 UT) tablet Take 5,000 Units by mouth daily. Patient states she takes 5 a day.   clonazePAM (KLONOPIN) 0.5 MG tablet Take 1 tablet (0.5 mg total) by mouth daily at 12 noon.   clonazePAM (KLONOPIN) 1 MG tablet Take 1 tablet (1 mg total) by mouth at bedtime.   clopidogrel (PLAVIX) 75 MG tablet TAKE 1 TABLET BY MOUTH EVERY DAY (Patient taking differently: Take 75 mg by mouth daily.)   escitalopram (LEXAPRO) 10 MG tablet Take 15 mg by mouth every morning.   estradiol (VIVELLE-DOT) 0.075 MG/24HR Place 1 patch onto the skin 2 (two) times a week.   etanercept (ENBREL SURECLICK) 50 MG/ML injection Inject 50 mg into the skin once a week.   HORIZANT 600 MG TBCR Take 1 tablet  by mouth every evening.   metoprolol tartrate (LOPRESSOR) 25 MG tablet TAKE 1 TABLET BY MOUTH EVERY DAY   Multiple Vitamins-Minerals (MULTIVITAMIN WITH MINERALS) tablet Take 1 tablet by mouth daily.   nitroGLYCERIN (NITROSTAT) 0.4 MG SL tablet Place 1 tablet (0.4 mg total) under the tongue every 5 (five) minutes as needed for chest pain.   potassium chloride SA (KLOR-CON M) 20 MEQ tablet Take 40 mEq by mouth daily.   rosuvastatin (CRESTOR) 40 MG tablet TAKE 1 TABLET BY MOUTH EVERY DAY   torsemide (DEMADEX) 20 MG tablet Take 20 mg by mouth daily.   urea 10 % lotion Apply 1 Application topically daily as needed for dry skin.   ZUBSOLV 5.7-1.4 MG SUBL Place 1 tablet under the tongue 3 (three) times daily.     Allergies:   Erythromycin   Past Medical History:  Diagnosis Date   Allergic rhinitis    Anxiety    Benzodiazepine dependence (HCC)    Bruxism    CAD (coronary artery disease)  S/p NSTEMI 01/2018 >> LHC: oLAD 20, pLAD 20; oD1 20, oLCx 40, pRCA 99 >> PCI: DES to prox RCA // Echo 01/2018: EF 50-55; prob inf-lat and inf HK   Chronic diastolic heart failure    Echocardiogram 07/2018: EF 55-60, grade 1 diastolic dysfunction, normal wall motion, normal GLS (-22.3), PASP 28   Chronic foot pain    bunions, torn ligaments-on chronic pain medication   CTS (carpal tunnel syndrome) 06/08/2014   Depression    High cholesterol    Hypertension    Low blood potassium    MDD (major depressive disorder) 10/02/2017   Migraine    Myocardial infarction (HCC) 02/01/2018   Pneumonia 1986   left lung   RA (rheumatoid arthritis) (HCC)     Past Surgical History:  Procedure Laterality Date   BTL  2000   CARPAL TUNNEL RELEASE Left    CESAREAN SECTION  1978. 1983   CORONARY STENT INTERVENTION N/A 01/27/2018   Procedure: CORONARY STENT INTERVENTION;  Surgeon: Kathleene Hazel, MD;  Location: [facility];  Service: Cardiovascular;  Laterality: N/A;   eye implants     FOOT SURGERY Left  2008   front teeth removed after injury     KNEE ARTHROSCOPY WITH LATERAL MENISECTOMY Left 02/02/2019   Procedure: LEFT KNEE ARTHROSCOPY, MEDIAL AND LATERAL MENISECTOMY, EXCISION OF LEFT LATERAL MENISCAL CYST;  Surgeon: Valeria Batman, MD;  Location: [facility];  Service: Orthopedics;  Laterality: Left;   LEFT HEART CATH AND CORONARY ANGIOGRAPHY N/A 01/27/2018   Procedure: LEFT HEART CATH AND CORONARY ANGIOGRAPHY;  Surgeon: Kathleene Hazel, MD;  Location: [facility];  Service: Cardiovascular;  Laterality: N/A;   pre cancerous lesion removed from forehead     TOTAL ABDOMINAL HYSTERECTOMY W/ BILATERAL SALPINGOOPHORECTOMY  2002   Dr. Jackelyn Schwartz   VAGINAL HYSTERECTOMY  2001     Social History:  The patient  reports that she has been smoking cigarettes. She started smoking about 52 years ago. She has a 10.5 pack-year smoking history. She has never been exposed to tobacco smoke. She has never used smokeless tobacco. She reports that she does not drink alcohol and does not use drugs.   Family History:  The patient's family history includes Arthritis in her maternal grandmother; Breast cancer in her mother; Heart attack in her maternal grandmother and paternal grandfather; Parkinson's disease in her father; Raynaud syndrome in her maternal aunt; Stroke in her paternal grandmother; Thyroid disease in her mother.    ROS:  Please see the history of present illness. All other systems are reviewed and  Negative to the above problem except as noted.    PHYSICAL EXAM: VS:  BP 106/72   Pulse 72   Ht 5\' 4"  (1.626 m)   Wt 158 lb 12.8 oz (72 kg)   SpO2 94%   BMI 27.26 kg/m   GEN: Well nourished, well developed, in no acute distress  HEENT: normal  Neck: JVP is not elevated    Cardiac: RRR; no murmur    1 +  LE  edema  Respiratory:  clear to auscultation  GI: soft, nontender, nondistended,   No hepatomegaly   EKG:  Not done today   Echo:  July 2022    1. Left ventricular ejection  fraction, by estimation, is 60 to 65%. The  left ventricle has normal function. The left ventricle has no regional  wall motion abnormalities. There is mild asymmetric left ventricular  hypertrophy of the basal-septal segment.  Left ventricular diastolic parameters are consistent with Grade I  diastolic dysfunction (impaired relaxation).   2. Right ventricular systolic function is normal. The right ventricular  size is normal. There is normal pulmonary artery systolic pressure.   3. The mitral valve is normal in structure. No evidence of mitral valve  regurgitation. No evidence of mitral stenosis.   4. The aortic valve is tricuspid. Aortic valve regurgitation is not  visualized. No aortic stenosis is present.   5. The inferior vena cava is normal in size with greater than 50%  respiratory variability, suggesting right atrial pressure of 3 mmHg.    Lipid Panel    Component Value Date/Time   CHOL 171 02/04/2020 1652   TRIG 88 02/04/2020 1652   HDL 67 02/04/2020 1652   CHOLHDL 2.6 02/04/2020 1652   CHOLHDL 2.7 01/28/2018 0353   VLDL 16 01/28/2018 0353   LDLCALC 88 02/04/2020 1652      Wt Readings from Last 3 Encounters:  10/14/22 158 lb 12.8 oz (72 kg)  09/11/22 158 lb (71.7 kg)  08/28/22 155 lb 12.8 oz (70.7 kg)      ASSESSMENT AND PLAN:  1  Hx HFpEF   Pt with some edema  on exam   Will check BMET and Mg   Increase KCL to 2 tabs per day  2 CAD  s/p DES to RCA in 2019   No symptoms of angina    3  HL  Will get lipomed panel  Keep on preavachol  4  HTN   BP controlled    5  Hx prolonged QT    Keep           Current medicines are reviewed at length with the patient today.  The patient does not have concerns regarding medicines.  Signed, Meghan Pates, MD  10/14/2022 9:55 PM    [facility] [address] [place], [place], [place]  [address] Phone: [phone]; Fax: [phone]

## 2022-10-14 ENCOUNTER — Encounter: Payer: Self-pay | Admitting: Family Medicine

## 2022-10-14 ENCOUNTER — Ambulatory Visit: Payer: Medicare PPO | Admitting: Internal Medicine

## 2022-10-14 ENCOUNTER — Encounter: Payer: Self-pay | Admitting: Internal Medicine

## 2022-10-14 VITALS — BP 106/72 | HR 72 | Ht 64.0 in | Wt 158.8 lb

## 2022-10-14 DIAGNOSIS — E782 Mixed hyperlipidemia: Secondary | ICD-10-CM | POA: Diagnosis not present

## 2022-10-14 DIAGNOSIS — R0602 Shortness of breath: Secondary | ICD-10-CM

## 2022-10-14 DIAGNOSIS — E876 Hypokalemia: Secondary | ICD-10-CM

## 2022-10-14 DIAGNOSIS — I5032 Chronic diastolic (congestive) heart failure: Secondary | ICD-10-CM

## 2022-10-14 LAB — LAB REPORT - SCANNED: EGFR: 83

## 2022-10-14 NOTE — Patient Instructions (Addendum)
Medication Instructions: INCREASE K (POTASSIUM) TO 2 A DAY   *If you need a refill on your cardiac medications before your next appointment, please call your pharmacy*   Lab Work: One week 10/21/22... take NMR, HGBA1C, BMET, MAG If you have labs (blood work) drawn today and your tests are completely normal, you will receive your results only by: MyChart Message (if you have MyChart) OR A paper copy in the mail If you have any lab test that is abnormal or we need to change your treatment, we will call you to review the results.   Testing/Procedures:    Follow-Up: At [facility], you and your health needs are our priority.  As part of our continuing mission to provide you with exceptional heart care, we have created designated Provider Care Teams.  These Care Teams include your primary Cardiologist (physician) and Advanced Practice Providers (APPs -  Physician Assistants and Nurse Practitioners) who all work together to provide you with the care you need, when you need it.  We recommend signing up for the patient portal called "MyChart".  Sign up information is provided on this After Visit Summary.  MyChart is used to connect with patients for Virtual Visits (Telemedicine).  Patients are able to view lab/test results, encounter notes, upcoming appointments, etc.  Non-urgent messages can be sent to your provider as well.   To learn more about what you can do with MyChart, go to [email or web address].    Your next appointment:   6 month(s)  Provider:   Dietrich Pates, MD     Other Instructions

## 2022-10-15 DIAGNOSIS — F1320 Sedative, hypnotic or anxiolytic dependence, uncomplicated: Secondary | ICD-10-CM | POA: Diagnosis not present

## 2022-10-15 DIAGNOSIS — F1121 Opioid dependence, in remission: Secondary | ICD-10-CM | POA: Diagnosis not present

## 2022-10-15 DIAGNOSIS — F332 Major depressive disorder, recurrent severe without psychotic features: Secondary | ICD-10-CM | POA: Diagnosis not present

## 2022-10-15 DIAGNOSIS — F411 Generalized anxiety disorder: Secondary | ICD-10-CM | POA: Diagnosis not present

## 2022-10-21 ENCOUNTER — Ambulatory Visit: Payer: Medicare PPO | Attending: Family Medicine

## 2022-10-22 ENCOUNTER — Other Ambulatory Visit: Payer: Medicare PPO

## 2022-10-22 DIAGNOSIS — I5032 Chronic diastolic (congestive) heart failure: Secondary | ICD-10-CM | POA: Diagnosis not present

## 2022-10-30 ENCOUNTER — Encounter: Payer: Self-pay | Admitting: Internal Medicine

## 2022-10-30 ENCOUNTER — Ambulatory Visit: Payer: Medicare PPO | Attending: Family Medicine

## 2022-10-30 ENCOUNTER — Other Ambulatory Visit: Payer: Self-pay

## 2022-10-30 ENCOUNTER — Ambulatory Visit: Payer: Medicare PPO | Attending: Internal Medicine | Admitting: Internal Medicine

## 2022-10-30 VITALS — BP 109/72 | HR 71 | Resp 14 | Ht 64.0 in | Wt 157.0 lb

## 2022-10-30 DIAGNOSIS — M1712 Unilateral primary osteoarthritis, left knee: Secondary | ICD-10-CM | POA: Diagnosis not present

## 2022-10-30 DIAGNOSIS — E876 Hypokalemia: Secondary | ICD-10-CM | POA: Diagnosis not present

## 2022-10-30 DIAGNOSIS — I5032 Chronic diastolic (congestive) heart failure: Secondary | ICD-10-CM

## 2022-10-30 DIAGNOSIS — R262 Difficulty in walking, not elsewhere classified: Secondary | ICD-10-CM | POA: Diagnosis not present

## 2022-10-30 DIAGNOSIS — R252 Cramp and spasm: Secondary | ICD-10-CM | POA: Insufficient documentation

## 2022-10-30 DIAGNOSIS — M069 Rheumatoid arthritis, unspecified: Secondary | ICD-10-CM

## 2022-10-30 DIAGNOSIS — M25562 Pain in left knee: Secondary | ICD-10-CM | POA: Diagnosis not present

## 2022-10-30 DIAGNOSIS — M6281 Muscle weakness (generalized): Secondary | ICD-10-CM | POA: Insufficient documentation

## 2022-10-30 MED ORDER — PREDNISONE 5 MG PO TABS
ORAL_TABLET | ORAL | 0 refills | Status: AC
Start: 2022-10-30 — End: 2022-11-10

## 2022-10-30 NOTE — Therapy (Signed)
OUTPATIENT PHYSICAL THERAPY LOWER EXTREMITY EVALUATION   Patient Name: Meghan Schwartz MRN: [id number] DOB:1950-12-11, 72 y.o., female Today's Date: 10/30/2022  END OF SESSION:  PT End of Session - 10/30/22 1546     Visit Number 1    Date for PT Re-Evaluation 12/25/22    Authorization Type [employer] MEDICARE CHOICE PPO    PT Start Time 1541    PT Stop Time 1635    PT Time Calculation (min) 54 min    Activity Tolerance Patient tolerated treatment well    Behavior During Therapy WFL for tasks assessed/performed             Past Medical History:  Diagnosis Date   Allergic rhinitis    Anxiety    Benzodiazepine dependence (HCC)    Bruxism    CAD (coronary artery disease)    S/p NSTEMI 01/2018 >> LHC: oLAD 20, pLAD 20; oD1 20, oLCx 40, pRCA 99 >> PCI: DES to prox RCA // Echo 01/2018: EF 50-55; prob inf-lat and inf HK   Chronic diastolic heart failure    Echocardiogram 07/2018: EF 55-60, grade 1 diastolic dysfunction, normal wall motion, normal GLS (-22.3), PASP 28   Chronic foot pain    bunions, torn ligaments-on chronic pain medication   CTS (carpal tunnel syndrome) 06/08/2014   Depression    High cholesterol    Hypertension    Low blood potassium    MDD (major depressive disorder) 10/02/2017   Migraine    Myocardial infarction (HCC) 02/01/2018   Pneumonia 1986   left lung   RA (rheumatoid arthritis) (HCC)    Past Surgical History:  Procedure Laterality Date   BTL  2000   CARPAL TUNNEL RELEASE Left    CESAREAN SECTION  1978. 1983   CORONARY STENT INTERVENTION N/A 01/27/2018   Procedure: CORONARY STENT INTERVENTION;  Surgeon: Kathleene Hazel, MD;  [facility]: [facility];  Service: Cardiovascular;  Laterality: N/A;   eye implants     FOOT SURGERY Left 2008   front teeth removed after injury     KNEE ARTHROSCOPY WITH LATERAL MENISECTOMY Left 02/02/2019   Procedure: LEFT KNEE ARTHROSCOPY, MEDIAL AND LATERAL MENISECTOMY, EXCISION OF LEFT LATERAL MENISCAL CYST;   Surgeon: Valeria Batman, MD;  Location: [facility];  Service: Orthopedics;  Laterality: Left;   LEFT HEART CATH AND CORONARY ANGIOGRAPHY N/A 01/27/2018   Procedure: LEFT HEART CATH AND CORONARY ANGIOGRAPHY;  Surgeon: Kathleene Hazel, MD;  [facility]: [facility];  Service: Cardiovascular;  Laterality: N/A;   pre cancerous lesion removed from forehead     TOTAL ABDOMINAL HYSTERECTOMY W/ BILATERAL SALPINGOOPHORECTOMY  2002   Dr. Jackelyn Knife   VAGINAL HYSTERECTOMY  2001   Patient Active Problem List   Diagnosis Date Noted   Hypocalcemia 08/21/2022   Falls, initial encounter 08/21/2022   Prolonged QT interval 08/21/2022   Acute metabolic encephalopathy 08/21/2022   Essential hypertension 08/21/2022   AKI (acute kidney injury) (HCC) 08/21/2022   Chronic pain 08/21/2022   Chronic diastolic CHF (congestive heart failure) (HCC) 08/21/2022   Effusion, left knee 06/12/2022   Leg edema, left 12/25/2021   Claw toe, left 08/07/2021   Dog bite 03/05/2021   High risk medication use 02/05/2021   Dry mouth 02/05/2021   Pain in left foot 01/31/2021   Bunion of great toe of right foot 06/15/2020   Hypoxia 03/05/2019   Derangement of posterior horn of medial meniscus 02/02/2019   Derangement of posterior horn of lateral meniscus 02/02/2019  Meniscal cyst, unspecified laterality 12/01/2018   Torn medial meniscus 12/01/2018   Primary osteoarthritis of left knee 12/01/2018   Coronary artery disease involving native coronary artery of native heart without angina pectoris 09/14/2018   (HFpEF) heart failure with preserved ejection fraction (HCC)    Chronic pain of left knee 05/15/2018   Mass of left knee 05/15/2018   Non-ST elevation (NSTEMI) myocardial infarction [facility])    Chest pain 01/25/2018   RA (rheumatoid arthritis) (HCC) 01/25/2018   Hyperlipidemia 01/25/2018   Hypokalemia 01/25/2018   Depression with anxiety 01/25/2018   Tobacco abuse 01/25/2018   Lower extremity cellulitis  01/25/2018   Benzodiazepine dependence (HCC)    MDD (major depressive disorder), recurrent severe, without psychosis (HCC) 10/02/2017   CTS (carpal tunnel syndrome) 06/08/2014    PCP: Camie Patience, FNP   REFERRING PROVIDER: Dr. Sheliah Hatch  REFERRING DIAG: Left knee osteoarthritis   THERAPY DIAG:  Acute pain of left knee  Difficulty in walking, not elsewhere classified  Muscle weakness (generalized)  Cramp and spasm  Rationale for Evaluation and Treatment: Rehabilitation  ONSET DATE: 09/26/22  SUBJECTIVE:   SUBJECTIVE STATEMENT: Patient reports pain in left knee for quite some time but worsened in the past few weeks to the point of being debilitating.  She has underlying RA.  She c/o bakers cyst posterior knee as well.  Dr. August Saucer provided brace but patient felt it was too bulky so she has not been wearing this.  She arrives using sbqc with a nearly 30 degree valgus angle of left knee.  She is retired and has no hobbies or interests other than sleeping and occasional shopping.  She explains that she really has no tolerance for much of anything due to excessive fatigue and pain.    PERTINENT HISTORY: RA PAIN:  Are you having pain? Yes: NPRS scale: 2/10 Pain location: left knee Pain description: sharp, aching Aggravating factors: walking Relieving factors: rest, Tylenol, has bed that she can elevate her legs  PRECAUTIONS: Other: RA, Fall  RED FLAGS: None   WEIGHT BEARING RESTRICTIONS: No  FALLS:  Has patient fallen in last 6 months? Yes. Number of falls 20 per patient   She explains that she was having a neurological decline due to Potassium was at 2 when she got to hospital  LIVING ENVIRONMENT: Lives with: lives with their spouse Lives in: House/apartment Stairs: Yes: Internal: 15 steps; on right going up and External: 4 steps; on right going up Has following equipment at home: Single point cane  OCCUPATION: retired  PLOF: Independent, Independent with  basic ADLs, Independent with household mobility with device, Independent with community mobility with device, Independent with transfers, Requires assistive device for independence, Needs assistance with ADLs, Needs assistance with homemaking, and Needs assistance with gait  PATIENT GOALS: to avoid knee replacement  NEXT MD VISIT: prn  OBJECTIVE:    PATIENT SURVEYS:  FOTO 24, predicted 48  COGNITION: Overall cognitive status: Within functional limits for tasks assessed     SENSATION: WFL  EDEMA:  Circumferential: 40.8 cm   POSTURE:  Severe left knee valgus deformity   LOWER EXTREMITY ROM:  WNL  LOWER EXTREMITY MMT:  Generally 3+/5 on knee flexion and extension,  all others generally 4/5  FUNCTIONAL TESTS:  5 times sit to stand: 32.64 sec Timed up and go (TUG): 30.24 sec  GAIT: Distance walked: 30 feet Assistive device utilized: Single point cane Level of assistance: SBA Comments: antalgic, guarded, severe left knee valgus deformity  TODAY'S TREATMENT:                                                                                                                              DATE: 10/30/22 Initial eval completed and initiated HEP    PATIENT EDUCATION:  Education details: Initiated HEP, lengthy discussion about fall risk and need for walker,  suggested rollator walker and provided handouts for exercises and photo of appropriate walker.  Person educated: Patient Education method: Explanation, Demonstration, Verbal cues, and Handouts Education comprehension: verbalized understanding, returned demonstration, and verbal cues required  HOME EXERCISE PROGRAM: Access Code: [facility] URL: [email or web address] Date: 10/30/2022 Prepared by: Mikey Kirschner  Exercises - Supine Quadricep Sets  - 1 x daily - 7 x weekly - 2 sets - 10 reps - Supine Straight Leg Raises  - 1 x daily - 7 x weekly - 2 sets - 10 reps - Seated Long Arc Quad  - 1 x daily - 7 x  weekly - 2 sets - 10 reps  ASSESSMENT:  CLINICAL IMPRESSION: Patient is a 72 y.o. female who was seen today for physical therapy evaluation and treatment for left knee pain.  She presents with decreased ROM, strength and function along with elevated pain.  She has severe, almost 30 degree valgus deformity in weight bearing.  Gait is antalgic, slow and guarded.  She explains that she falls frequently.  "I have a walker but it scratches up my floors".  She has underlying RA which will likely affect her rehab but she should respond well to quad rehab and hip strengthening to reduce PF shearing and continued degenerative changes of the knee.  She would benefit from obtaining a rollator walker and skilled PT for quad rehab along with hip stability training and fall prevention.    OBJECTIVE IMPAIRMENTS: Abnormal gait, decreased balance, decreased knowledge of condition, difficulty walking, decreased ROM, decreased strength, increased edema, increased fascial restrictions, increased muscle spasms, impaired flexibility, and pain.   ACTIVITY LIMITATIONS: carrying, lifting, bending, sitting, standing, squatting, sleeping, stairs, transfers, bed mobility, bathing, and dressing  PARTICIPATION LIMITATIONS: meal prep, cleaning, laundry, driving, shopping, community activity, yard work, and church  PERSONAL FACTORS: Fitness and 1-2 comorbidities: RA, CAD  are also affecting patient's functional outcome.   REHAB POTENTIAL: Good  CLINICAL DECISION MAKING: Stable/uncomplicated  EVALUATION COMPLEXITY: Low   GOALS: Goals reviewed with patient? Yes  SHORT TERM GOALS: Target date: 11/27/2022  Pain report to be no greater than 4/10  Baseline: Goal status: INITIAL  2.  Patient will be independent with initial HEP  Baseline:  Goal status: INITIAL  3.  Patient to demonstrate proper heel to toe progression on left LE Baseline:  Goal status: INITIAL  LONG TERM GOALS: Target date: 12/25/2022   Patient to  report pain no greater than 2/10  Baseline:  Goal status: INITIAL  2.  Patient to be independent with advanced HEP  Baseline:  Goal status: INITIAL  3.  Patient  to be able to stand or walk for at least 15 min without leg pain  Baseline:  Goal status: INITIAL  4.  Functional scores to improve by 2-3 seconds Baseline:  Goal status: INITIAL  5.  FOTO to be Baseline:  Goal status: INITIAL  6.  Patient to report 85% improvement in overall symptoms Baseline:  Goal status: INITIAL   PLAN:  PT FREQUENCY: 1-2x/week  PT DURATION: 8 weeks  PLANNED INTERVENTIONS: Therapeutic exercises, Therapeutic activity, Neuromuscular re-education, Balance training, Gait training, Patient/Family education, Self Care, Joint mobilization, Stair training, DME instructions, Aquatic Therapy, Dry Needling, Electrical stimulation, Cryotherapy, Moist heat, Compression bandaging, Splintting, Taping, Vasopneumatic device, Ultrasound, Ionotophoresis 4mg /ml Dexamethasone, Manual therapy, and Re-evaluation  PLAN FOR NEXT SESSION: Review HEP, Nustep, quad rehab and hip stability training.   Victorino Dike B. Usher Hedberg, PT 10/30/22 4:48 PM [facility] [address], [address] [place], [place] [address] Phone # [phone] Fax [phone]

## 2022-10-30 NOTE — Progress Notes (Unsigned)
Office Visit Note  Patient: Meghan Schwartz             Date of Birth: 29-Dec-1950           MRN: [id number]             PCP: Camie Patience, FNP Referring: Camie Patience, FNP Visit Date: 10/30/2022   Subjective:  Follow-up (Patient states the baker's cyst has started hurting a lot. Patient states she thinks the ganglion cyst is larger. Patient states there is a spot located to the left of her left knee cap. )   History of Present Illness: Meghan Schwartz is a 72 y.o. female here for follow up today for left knee pain and swelling. This is worse especially with pain in the posterior knee in the past week. This was not preceded by any specific injury illness or new activity. She is not wearing the knee brace recommended by Dr. August Saucer because it is too bulky on her swollen knee and hits against the right knee. Also has pain and tenderness in an area below and lateral to the knee that is chronic but worse lately. She remains on enbrel 50 mg [place] weekly without interruption for her RA and no increased swelling elsewhere.  Previous HPI 09/11/22 SHIRITA Schwartz is a 72 y.o. female here for follow up for rheumatoid arthritis on Enbrel 50 mg subcu weekly and osteoarthritis of the left knee.  We last saw her in April with repeat aspiration of the knee and popliteal cyst with no steroid injection at the time due to recent treatment.  She feels the cyst and back of knee pain has remained improved since that time.  She had a hospitalization last month due to severe hypokalemia down to 2.0.  Had preceding symptoms of worsening muscle weakness with change in posture and worsening mobility for the preceding weeks until waking up with profound muscle weakness inability to stand and severe constipation.  Had some adjustment with her torsemide and potassium supplementation and now back to about her baseline.   Previous HPI 07/30/22 EMERLY Schwartz is a 72 y.o. female with rheumatoid arthritis on Enbrel 50 mg subcu weekly and  osteoarthritis here for left knee joint pain and swelling.  At her last visit she felt there was more improvement in her knee pain with the Baker's cyst aspiration as well as joint injection versus previous aspiration injections at only the suprapatellar pouch.  Subsequently saw Dr. August Saucer for evaluation of her severe primary osteoarthritis of the knee and also underwent joint aspiration that time with 45 cc fluid removed.  He discussed options for her realistically the only available procedure would be total knee arthroplasty.  She is hesitant about whether or not to pursue this due to associated pain and required rehabilitation and also cardiovascular risk.  Though she was cleared by her cardiologist to proceed with this if she wishes.  Currently has back due to increasing pain and swelling in the knee again and she is interested whether the popliteal cyst could be aspirated or injected again.  She is not noticing increased pain or swelling in other joints besides the knee.  No interruption or change to her Enbrel or other medications. No new infections or injuries.   Previous HPI 06/12/22 Meghan Schwartz is a 72 y.o. female here for follow up for RA on Enbrel 50 mg [place] weekly and osteoarthritis with chronic left knee pain and swelling. We aspirated the left knee in  March but she saw quick re accumulation of fluid.  Outside of left knee has not experienced much exacerbation of upper extremity pain.  Also describes some clicking or popping sensation frequently occurring and with decreased steadiness on her feet.  She is usually able to walk adequately with some form of assistive device at least a cane.   Previous HPI 05/06/22 Meghan Schwartz is a 72 y.o. female here for follow up for RA on Enbrel 50 mg subcu weekly and osteoarthritis.  Since her last visit she had an issue with worsening pedal edema and a very large associated weight gain.  She was referred to see Dr. Tenny Craw with cardiology.  She was prescribed  metolazone to combine with her torsemide and saw a rapid diuresis with 15 pound weight loss in 1 day.  Initially had associated skin peeling since then still has erythema and rough or thick skin over the affected areas but without any reaccumulation of the weight and pitting fluid.  Blood pressure has been somewhat low.  She has had some falls due to instability she is noticing trouble where she cannot apply weight onto her right foot evenly just gets pressure around the base of the second and third toe.  And left knee is causing more problems with reaccumulation of the large effusion and also with posterior swelling from baker's cyst.     Previous HPI 02/05/21 Meghan Schwartz is a 72 y.o. female her for rheumatoid arthritis on Enbrel 50 mg [place] weekly for which she has been seeing Dr. Nickola Major previously. She was originally diagnosed about 10 or 11 years ago due to development of joint pain, stiffness, synovitis, and progressive joint deformities primarily starting in the feet and knees.  Subsequently proceeded to have increased joint pains involving bilateral hands with some swelling at the PIP and MCP joints.  She started treatment with Enbrel injections with some improvement in the lower extremity joint pains she recalls still having a lot of pain symptoms and pronounced fatigue that did not clear with this treatment.  Combination treatment was tried with methotrexate with significant GI intolerance.  She tried leflunomide but developed substantial alopecia so discontinued the medicine.  She switched to hydroxychloroquine combination treatment which was being well-tolerated but she has had multiple new or worsening medical problems over the past few years with concern of medication related effects. She had hand numbness in the past improved after left carpal tunnel release surgery but she has some residual muscle atrophy in thenar prominence. She had left knee meniscectomy in 2020. She suffered NSTEMI in 01/2018  with DES placement in RCA for 99% blockage and has chronic diastolic CHF. Her edema is generally controlled while taking torsemide but does rapidly re accumulate fluid off this medication. Ophthalmology evaluation indicated significant visual field deficits findings consistent with age-related macular degeneration of both eyes and recommended avoidance of this medication.  She also has considerable osteoarthritis especially in her knees and feet previous surgical reconstruction on the left foot. Overall she feels her gait is not very stable due to these problems.   DMARD Hx Enbrel - current   HCQ - macular disease? LEF - alopecia MTX - GI intolerance.    Review of Systems  Constitutional:  Positive for fatigue.  HENT:  Positive for mouth dryness. Negative for mouth sores.   Eyes:  Positive for dryness.  Respiratory:  Positive for shortness of breath.   Cardiovascular:  Negative for chest pain and palpitations.  Gastrointestinal:  Positive for  constipation. Negative for blood in stool and diarrhea.  Endocrine: Positive for increased urination.  Genitourinary:  Positive for involuntary urination.  Musculoskeletal:  Positive for joint pain, gait problem, joint pain, joint swelling, myalgias, muscle weakness, morning stiffness, muscle tenderness and myalgias.  Skin:  Negative for color change, rash, hair loss and sensitivity to sunlight.  Allergic/Immunologic: Negative for susceptible to infections.  Neurological:  Positive for dizziness and headaches.  Hematological:  Negative for swollen glands.  Psychiatric/Behavioral:  Positive for depressed mood. Negative for sleep disturbance. The patient is nervous/anxious.     PMFS History:  Patient Active Problem List   Diagnosis Date Noted   Hypocalcemia 08/21/2022   Falls, initial encounter 08/21/2022   Prolonged QT interval 08/21/2022   Acute metabolic encephalopathy 08/21/2022   Essential hypertension 08/21/2022   AKI (acute kidney  injury) (HCC) 08/21/2022   Chronic pain 08/21/2022   Chronic diastolic CHF (congestive heart failure) (HCC) 08/21/2022   Effusion, left knee 06/12/2022   Leg edema, left 12/25/2021   Claw toe, left 08/07/2021   Dog bite 03/05/2021   High risk medication use 02/05/2021   Dry mouth 02/05/2021   Pain in left foot 01/31/2021   Bunion of great toe of right foot 06/15/2020   Hypoxia 03/05/2019   Derangement of posterior horn of medial meniscus 02/02/2019   Derangement of posterior horn of lateral meniscus 02/02/2019   Meniscal cyst, unspecified laterality 12/01/2018   Torn medial meniscus 12/01/2018   Primary osteoarthritis of left knee 12/01/2018   Coronary artery disease involving native coronary artery of native heart without angina pectoris 09/14/2018   (HFpEF) heart failure with preserved ejection fraction (HCC)    Chronic pain of left knee 05/15/2018   Mass of left knee 05/15/2018   Non-ST elevation (NSTEMI) myocardial infarction [facility])    Chest pain 01/25/2018   RA (rheumatoid arthritis) (HCC) 01/25/2018   Hyperlipidemia 01/25/2018   Hypokalemia 01/25/2018   Depression with anxiety 01/25/2018   Tobacco abuse 01/25/2018   Lower extremity cellulitis 01/25/2018   Benzodiazepine dependence (HCC)    MDD (major depressive disorder), recurrent severe, without psychosis (HCC) 10/02/2017   CTS (carpal tunnel syndrome) 06/08/2014    Past Medical History:  Diagnosis Date   Allergic rhinitis    Anxiety    Benzodiazepine dependence (HCC)    Bruxism    CAD (coronary artery disease)    S/p NSTEMI 01/2018 >> LHC: oLAD 20, pLAD 20; oD1 20, oLCx 40, pRCA 99 >> PCI: DES to prox RCA // Echo 01/2018: EF 50-55; prob inf-lat and inf HK   Chronic diastolic heart failure    Echocardiogram 07/2018: EF 55-60, grade 1 diastolic dysfunction, normal wall motion, normal GLS (-22.3), PASP 28   Chronic foot pain    bunions, torn ligaments-on chronic pain medication   CTS (carpal tunnel syndrome)  06/08/2014   Depression    High cholesterol    Hypertension    Low blood potassium    MDD (major depressive disorder) 10/02/2017   Migraine    Myocardial infarction (HCC) 02/01/2018   Pneumonia 1986   left lung   RA (rheumatoid arthritis) (HCC)     Family History  Problem Relation Age of Onset   Thyroid disease Mother    Breast cancer Mother    Parkinson's disease Father    Raynaud syndrome Maternal Aunt    Arthritis Maternal Grandmother    Heart attack Maternal Grandmother    Stroke Paternal Grandmother    Heart attack Paternal Grandfather  Past Surgical History:  Procedure Laterality Date   BTL  2000   CARPAL TUNNEL RELEASE Left    CESAREAN SECTION  1978. 1983   CORONARY STENT INTERVENTION N/A 01/27/2018   Procedure: CORONARY STENT INTERVENTION;  Surgeon: Kathleene Hazel, MD;  Location: [facility];  Service: Cardiovascular;  Laterality: N/A;   eye implants     FOOT SURGERY Left 2008   front teeth removed after injury     KNEE ARTHROSCOPY WITH LATERAL MENISECTOMY Left 02/02/2019   Procedure: LEFT KNEE ARTHROSCOPY, MEDIAL AND LATERAL MENISECTOMY, EXCISION OF LEFT LATERAL MENISCAL CYST;  Surgeon: Valeria Batman, MD;  Location: [facility];  Service: Orthopedics;  Laterality: Left;   LEFT HEART CATH AND CORONARY ANGIOGRAPHY N/A 01/27/2018   Procedure: LEFT HEART CATH AND CORONARY ANGIOGRAPHY;  Surgeon: Kathleene Hazel, MD;  Location: [facility];  Service: Cardiovascular;  Laterality: N/A;   pre cancerous lesion removed from forehead     TOTAL ABDOMINAL HYSTERECTOMY W/ BILATERAL SALPINGOOPHORECTOMY  2002   Dr. Jackelyn Knife   VAGINAL HYSTERECTOMY  2001   Social History   Social History Narrative   Lives at home with spouse.    Right handed.   Caffeine use: 2 cups coffee/day    8 oz soda/day    Immunization History  Administered Date(s) Administered   PFIZER(Purple Top)SARS-COV-2 Vaccination 04/10/2019, 05/05/2019, 10/19/2019, 06/20/2020    Pfizer Covid-19 Vaccine Bivalent Booster 37yrs & up 11/29/2020   Tdap 04/03/2018     Objective: Vital Signs: BP 109/72 (BP Location: Left Arm, Patient Position: Sitting, Cuff Size: Normal)   Pulse 71   Resp 14   Ht 5\' 4"  (1.626 m)   Wt 157 lb (71.2 kg)   BMI 26.95 kg/m    Physical Exam Skin:    Comments: Chronic hyperpigmentation and lipodermatossclerotic changes in both lower legs, trace pitting edema      Musculoskeletal Exam:  Left knee with large joint effusion and palpable popliteal cyst, significant valgus deformity of the joint and patellofemoral crepitus with pain during active and passive range of motion Tender area to pressure distal to the knee on the lateral side extending several inches, no palpable swelling or nodules or erythema   Investigation: No additional findings.  Imaging: No results found.  Recent Labs: Lab Results  Component Value Date   WBC 3.1 (L) 08/22/2022   HGB 9.8 (L) 08/22/2022   PLT 141 (L) 08/22/2022   NA 143 10/22/2022   K 4.2 10/22/2022   CL 96 10/22/2022   CO2 33 (H) 10/22/2022   GLUCOSE 103 (H) 10/22/2022   BUN 16 10/22/2022   CREATININE 0.92 10/22/2022   BILITOT 0.5 09/11/2022   ALKPHOS 84 08/20/2022   AST 20 09/11/2022   ALT 13 09/11/2022   PROT 6.5 09/11/2022   ALBUMIN 2.7 (L) 08/23/2022   CALCIUM 9.8 10/22/2022   GFRAA 65 02/04/2020   QFTBGOLDPLUS NEGATIVE 06/12/2022    Speciality Comments: No specialty comments available.  Procedures:  Large Joint Inj: L knee on 10/30/2022 2:30 PM Indications: pain and joint swelling Details: 18 G 1.5 in needle, superolateral approach Medications: 2 mL lidocaine 1 % Aspirate: 83 mL yellow and clear Outcome: tolerated well, no immediate complications Procedure, treatment alternatives, risks and benefits explained, specific risks discussed. Consent was given by the patient. Immediately prior to procedure a time out was called to verify the correct patient, procedure, equipment,  support staff and site/side marked as required. Patient was prepped and draped in the  usual sterile fashion.     Allergies: Erythromycin   Assessment / Plan:     Visit Diagnoses: Primary osteoarthritis of left knee - Plan: Large Joint Inj: L knee  She has not been using the recommended knee brace from orthopedics I recommend she contact their office about this if there needs to be some adjustment or change made.  We injected the knee less than 2 months ago so no repeat intra-articular injection today but aspiration of large joint effusion to help with pain and mobility.  Rheumatoid arthritis, involving unspecified site, unspecified whether rheumatoid factor present (HCC) - Plan: predniSONE (DELTASONE) 5 MG tablet  Inflammatory arthritis appears well-controlled there is no peripheral joint synovitis appreciable outside the knee effusion.  Multiple previous aspiration with no inflammatory changes.  Continue on Enbrel 50 mg subcu weekly.  Hypokalemia Chronic diastolic CHF (congestive heart failure) (HCC)  Hospitalization few months ago for severe hypokalemia she had concerned about medication effects on this.  Discussed potential potassium loss on steroids would limit this to a short duration taper.  Last potassium level was normal at 4.2 checked a week ago.  Orders: Orders Placed This Encounter  Procedures   Large Joint Inj: L knee   Meds ordered this encounter  Medications   predniSONE (DELTASONE) 5 MG tablet    Sig: Take 4 tablets (20 mg total) by mouth daily with breakfast for 3 days, THEN 3 tablets (15 mg total) daily with breakfast for 3 days, THEN 2 tablets (10 mg total) daily with breakfast for 3 days, THEN 1 tablet (5 mg total) daily with breakfast for 3 days.    Dispense:  30 tablet    Refill:  0     Follow-Up Instructions: No follow-ups on file.   Fuller Plan, MD  Note - This record has been created using [employer].  Chart creation errors have been sought,  but may not always  have been located. Such creation errors do not reflect on  the standard of medical care.

## 2022-10-31 ENCOUNTER — Other Ambulatory Visit: Payer: Self-pay | Admitting: Internal Medicine

## 2022-10-31 DIAGNOSIS — M069 Rheumatoid arthritis, unspecified: Secondary | ICD-10-CM

## 2022-10-31 DIAGNOSIS — Z79899 Other long term (current) drug therapy: Secondary | ICD-10-CM

## 2022-10-31 MED ORDER — LIDOCAINE HCL 1 % IJ SOLN
2.0000 mL | INTRAMUSCULAR | Status: AC | PRN
Start: 2022-10-30 — End: 2022-10-30
  Administered 2022-10-30: 2 mL

## 2022-10-31 MED ORDER — ENBREL SURECLICK 50 MG/ML ~~LOC~~ SOAJ: 50.0000 mg | SUBCUTANEOUS | 0 refills | Status: DC

## 2022-10-31 NOTE — Telephone Encounter (Signed)
Last Fill: 12/06/2021  Labs: 10/22/2022 BMP Glucose 103 CO2 33  10/09/2022 CBC WBC 3.1  TB Gold: 06/12/2022 negative   Next Visit: 12/18/2022  Last Visit: 10/30/2022  DX: Rheumatoid arthritis, involving unspecified site, unspecified whether rheumatoid factor present   Current Dose per office note 10/30/2022: Enbrel 50 mg subcu weekly.   Okay to refill Enbrel?

## 2022-10-31 NOTE — Telephone Encounter (Signed)
Patient called stating she is out of refills for her Enbrel medication.  Patient states a new prescription needs to be faxed to [phone]

## 2022-11-13 ENCOUNTER — Ambulatory Visit: Payer: Medicare PPO

## 2022-11-13 DIAGNOSIS — R252 Cramp and spasm: Secondary | ICD-10-CM

## 2022-11-13 DIAGNOSIS — R262 Difficulty in walking, not elsewhere classified: Secondary | ICD-10-CM

## 2022-11-13 DIAGNOSIS — M6281 Muscle weakness (generalized): Secondary | ICD-10-CM | POA: Diagnosis not present

## 2022-11-13 DIAGNOSIS — M25562 Pain in left knee: Secondary | ICD-10-CM | POA: Diagnosis not present

## 2022-11-13 NOTE — Therapy (Signed)
OUTPATIENT PHYSICAL THERAPY LOWER EXTREMITY EVALUATION   Patient Name: Meghan Schwartz MRN: [id number] DOB:Jun 30, 1950, 72 y.o., female Today's Date: 11/13/2022  END OF SESSION:  PT End of Session - 11/13/22 1622     Visit Number 2    Date for PT Re-Evaluation 12/25/22    Authorization Type [employer] MEDICARE CHOICE PPO    PT Start Time 1623    PT Stop Time 1705    PT Time Calculation (min) 42 min    Activity Tolerance Patient tolerated treatment well    Behavior During Therapy WFL for tasks assessed/performed             Past Medical History:  Diagnosis Date   Allergic rhinitis    Anxiety    Benzodiazepine dependence (HCC)    Bruxism    CAD (coronary artery disease)    S/p NSTEMI 01/2018 >> LHC: oLAD 20, pLAD 20; oD1 20, oLCx 40, pRCA 99 >> PCI: DES to prox RCA // Echo 01/2018: EF 50-55; prob inf-lat and inf HK   Chronic diastolic heart failure    Echocardiogram 07/2018: EF 55-60, grade 1 diastolic dysfunction, normal wall motion, normal GLS (-22.3), PASP 28   Chronic foot pain    bunions, torn ligaments-on chronic pain medication   CTS (carpal tunnel syndrome) 06/08/2014   Depression    High cholesterol    Hypertension    Low blood potassium    MDD (major depressive disorder) 10/02/2017   Migraine    Myocardial infarction (HCC) 02/01/2018   Pneumonia 1986   left lung   RA (rheumatoid arthritis) (HCC)    Past Surgical History:  Procedure Laterality Date   BTL  2000   CARPAL TUNNEL RELEASE Left    CESAREAN SECTION  1978. 1983   CORONARY STENT INTERVENTION N/A 01/27/2018   Procedure: CORONARY STENT INTERVENTION;  Surgeon: Kathleene Hazel, MD;  [facility]: [facility];  Service: Cardiovascular;  Laterality: N/A;   eye implants     FOOT SURGERY Left 2008   front teeth removed after injury     KNEE ARTHROSCOPY WITH LATERAL MENISECTOMY Left 02/02/2019   Procedure: LEFT KNEE ARTHROSCOPY, MEDIAL AND LATERAL MENISECTOMY, EXCISION OF LEFT LATERAL MENISCAL CYST;   Surgeon: Valeria Batman, MD;  Location: [facility];  Service: Orthopedics;  Laterality: Left;   LEFT HEART CATH AND CORONARY ANGIOGRAPHY N/A 01/27/2018   Procedure: LEFT HEART CATH AND CORONARY ANGIOGRAPHY;  Surgeon: Kathleene Hazel, MD;  Location: [facility];  Service: Cardiovascular;  Laterality: N/A;   pre cancerous lesion removed from forehead     TOTAL ABDOMINAL HYSTERECTOMY W/ BILATERAL SALPINGOOPHORECTOMY  2002   Dr. Jackelyn Knife   VAGINAL HYSTERECTOMY  2001   Patient Active Problem List   Diagnosis Date Noted   Hypocalcemia 08/21/2022   Falls, initial encounter 08/21/2022   Prolonged QT interval 08/21/2022   Acute metabolic encephalopathy 08/21/2022   Essential hypertension 08/21/2022   AKI (acute kidney injury) (HCC) 08/21/2022   Chronic pain 08/21/2022   Chronic diastolic CHF (congestive heart failure) (HCC) 08/21/2022   Effusion, left knee 06/12/2022   Leg edema, left 12/25/2021   Claw toe, left 08/07/2021   Dog bite 03/05/2021   High risk medication use 02/05/2021   Dry mouth 02/05/2021   Pain in left foot 01/31/2021   Bunion of great toe of right foot 06/15/2020   Hypoxia 03/05/2019   Derangement of posterior horn of medial meniscus 02/02/2019   Derangement of posterior horn of lateral meniscus 02/02/2019  Meniscal cyst, unspecified laterality 12/01/2018   Torn medial meniscus 12/01/2018   Primary osteoarthritis of left knee 12/01/2018   Coronary artery disease involving native coronary artery of native heart without angina pectoris 09/14/2018   (HFpEF) heart failure with preserved ejection fraction (HCC)    Chronic pain of left knee 05/15/2018   Mass of left knee 05/15/2018   Non-ST elevation (NSTEMI) myocardial infarction [facility])    Chest pain 01/25/2018   RA (rheumatoid arthritis) (HCC) 01/25/2018   Hyperlipidemia 01/25/2018   Hypokalemia 01/25/2018   Depression with anxiety 01/25/2018   Tobacco abuse 01/25/2018   Lower extremity cellulitis  01/25/2018   Benzodiazepine dependence (HCC)    MDD (major depressive disorder), recurrent severe, without psychosis (HCC) 10/02/2017   CTS (carpal tunnel syndrome) 06/08/2014    PCP: Camie Patience, FNP   REFERRING PROVIDER: Dr. Sheliah Hatch  REFERRING DIAG: Left knee osteoarthritis   THERAPY DIAG:  Acute pain of left knee  Difficulty in walking, not elsewhere classified  Muscle weakness (generalized)  Cramp and spasm  Rationale for Evaluation and Treatment: Rehabilitation  ONSET DATE: 09/26/22  SUBJECTIVE:   SUBJECTIVE STATEMENT: Patient ambulates back to gym from lobby using sbqc and carrying a water cup.  She states she had so much pain yesterday that she was in tears all day.  "Surprisingly, today, I woke up and felt a lot better"  Pain reported at 3/10.      PERTINENT HISTORY: RA PAIN:  Are you having pain? Yes: NPRS scale: 2/10 Pain location: left knee Pain description: sharp, aching Aggravating factors: walking Relieving factors: rest, Tylenol, has bed that she can elevate her legs  PRECAUTIONS: Other: RA, Fall  RED FLAGS: None   WEIGHT BEARING RESTRICTIONS: No  FALLS:  Has patient fallen in last 6 months? Yes. Number of falls 20 per patient   She explains that she was having a neurological decline due to Potassium was at 2 when she got to hospital  LIVING ENVIRONMENT: Lives with: lives with their spouse Lives in: House/apartment Stairs: Yes: Internal: 15 steps; on right going up and External: 4 steps; on right going up Has following equipment at home: Single point cane  OCCUPATION: retired  PLOF: Independent, Independent with basic ADLs, Independent with household mobility with device, Independent with community mobility with device, Independent with transfers, Requires assistive device for independence, Needs assistance with ADLs, Needs assistance with homemaking, and Needs assistance with gait  PATIENT GOALS: to avoid knee replacement  NEXT  MD VISIT: prn  OBJECTIVE:    PATIENT SURVEYS:  FOTO 24, predicted 48  COGNITION: Overall cognitive status: Within functional limits for tasks assessed     SENSATION: WFL  EDEMA:  Circumferential: 40.8 cm   POSTURE:  Severe left knee valgus deformity   LOWER EXTREMITY ROM:  WNL  LOWER EXTREMITY MMT:  Generally 3+/5 on knee flexion and extension,  all others generally 4/5  FUNCTIONAL TESTS:  5 times sit to stand: 32.64 sec Timed up and go (TUG): 30.24 sec  GAIT: Distance walked: 30 feet Assistive device utilized: Single point cane Level of assistance: SBA Comments: antalgic, guarded, severe left knee valgus deformity   TODAY'S TREATMENT:  DATE: 11/13/22 Nustep x 6 min 30 sec at level 1 (PT present to discuss progress and status) Lengthy discussion regarding use of walker vs cane and safety issues, also discussed that she has appt with Dr. Lequita Halt for second opinion for possible TKA.  Patient states she is afraid to ask for pain medication because of the stigma of narcotics and she doesn't want to be viewed as a "pain med addict".   Seated LAQ x 20 0# left Seated hip flexion 0# left Seated hip ER 0# left Seated clam with yellow band x 20  Sit to stand x 5 with use of hands Educated on use of ice vs heat and how heat is contraindicated for active RA.    DATE: 10/30/22 Initial eval completed and initiated HEP    PATIENT EDUCATION:  Education details: Initiated HEP, lengthy discussion about fall risk and need for walker,  suggested rollator walker and provided handouts for exercises and photo of appropriate walker.  Person educated: Patient Education method: Explanation, Demonstration, Verbal cues, and Handouts Education comprehension: verbalized understanding, returned demonstration, and verbal cues required  HOME EXERCISE PROGRAM: Access  Code: [facility] URL: [email or web address] Date: 10/30/2022 Prepared by: Mikey Kirschner  Exercises - Supine Quadricep Sets  - 1 x daily - 7 x weekly - 2 sets - 10 reps - Supine Straight Leg Raises  - 1 x daily - 7 x weekly - 2 sets - 10 reps - Seated Long Arc Quad  - 1 x daily - 7 x weekly - 2 sets - 10 reps  ASSESSMENT:  CLINICAL IMPRESSION: Nonie was able to do several exercises without any increase in pain.  She has a pretty severe valgus deformity which is also affecting her alignment for her entire kinetic chain.  She may progress quite slowly but we discussed attempting land PT for several visits.  If she is not responding, we may have her try going to the pool.   She would benefit from obtaining a rollator walker and skilled PT for quad rehab along with hip stability training and fall prevention.    OBJECTIVE IMPAIRMENTS: Abnormal gait, decreased balance, decreased knowledge of condition, difficulty walking, decreased ROM, decreased strength, increased edema, increased fascial restrictions, increased muscle spasms, impaired flexibility, and pain.   ACTIVITY LIMITATIONS: carrying, lifting, bending, sitting, standing, squatting, sleeping, stairs, transfers, bed mobility, bathing, and dressing  PARTICIPATION LIMITATIONS: meal prep, cleaning, laundry, driving, shopping, community activity, yard work, and church  PERSONAL FACTORS: Fitness and 1-2 comorbidities: RA, CAD  are also affecting patient's functional outcome.   REHAB POTENTIAL: Good  CLINICAL DECISION MAKING: Stable/uncomplicated  EVALUATION COMPLEXITY: Low   GOALS: Goals reviewed with patient? Yes  SHORT TERM GOALS: Target date: 11/27/2022  Pain report to be no greater than 4/10  Baseline: Goal status: INITIAL  2.  Patient will be independent with initial HEP  Baseline:  Goal status: INITIAL  3.  Patient to demonstrate proper heel to toe progression on left LE Baseline:  Goal status:  INITIAL  LONG TERM GOALS: Target date: 12/25/2022   Patient to report pain no greater than 2/10  Baseline:  Goal status: INITIAL  2.  Patient to be independent with advanced HEP  Baseline:  Goal status: INITIAL  3.  Patient to be able to stand or walk for at least 15 min without leg pain  Baseline:  Goal status: INITIAL  4.  Functional scores to improve by 2-3 seconds Baseline:  Goal status: INITIAL  5.  FOTO to be  Baseline:  Goal status: INITIAL  6.  Patient to report 85% improvement in overall symptoms Baseline:  Goal status: INITIAL   PLAN:  PT FREQUENCY: 1-2x/week  PT DURATION: 8 weeks  PLANNED INTERVENTIONS: Therapeutic exercises, Therapeutic activity, Neuromuscular re-education, Balance training, Gait training, Patient/Family education, Self Care, Joint mobilization, Stair training, DME instructions, Aquatic Therapy, Dry Needling, Electrical stimulation, Cryotherapy, Moist heat, Compression bandaging, Splintting, Taping, Vasopneumatic device, Ultrasound, Ionotophoresis 4mg /ml Dexamethasone, Manual therapy, and Re-evaluation  PLAN FOR NEXT SESSION:  Nustep, quad rehab and hip stability training.   Victorino Dike B. Madelaine Whipple, PT 11/13/22 5:21 PM [facility] [address], [address] [place], [place] [address] Phone # [phone] Fax [phone]

## 2022-11-14 ENCOUNTER — Encounter: Payer: Self-pay | Admitting: Podiatry

## 2022-11-14 ENCOUNTER — Ambulatory Visit: Payer: Medicare PPO | Admitting: Podiatry

## 2022-11-14 VITALS — BP 113/66 | HR 69

## 2022-11-14 DIAGNOSIS — D689 Coagulation defect, unspecified: Secondary | ICD-10-CM

## 2022-11-14 DIAGNOSIS — L84 Corns and callosities: Secondary | ICD-10-CM | POA: Diagnosis not present

## 2022-11-15 NOTE — Progress Notes (Signed)
Subjective:   Patient ID: Meghan Schwartz, female   DOB: 72 y.o.   MRN: [id number]   HPI Patient presents with prominent callus formation bilateral with patient very poor health on blood thinner   ROS      Objective:  Physical Exam  Neurovascular status unchanged thick keratotic lesion bilateral on blood thinner     Assessment:  Chronic lesion with rheumatoid arthritis severe pain with patient on blood thinner     Plan:  Debridement of lesion no iatrogenic bleeding x 2 bilateral and will see back again in the next several months earlier if needed

## 2022-11-21 ENCOUNTER — Ambulatory Visit: Payer: Medicare PPO | Admitting: Physical Therapy

## 2022-11-21 DIAGNOSIS — R262 Difficulty in walking, not elsewhere classified: Secondary | ICD-10-CM

## 2022-11-21 DIAGNOSIS — R252 Cramp and spasm: Secondary | ICD-10-CM | POA: Diagnosis not present

## 2022-11-21 DIAGNOSIS — M6281 Muscle weakness (generalized): Secondary | ICD-10-CM | POA: Diagnosis not present

## 2022-11-21 DIAGNOSIS — M25562 Pain in left knee: Secondary | ICD-10-CM

## 2022-11-21 NOTE — Therapy (Signed)
OUTPATIENT PHYSICAL THERAPY LOWER EXTREMITY PROGRESS NOTE  Patient Name: Meghan Schwartz MRN: [id number] DOB:September 09, 1950, 72 y.o., female Today's Date: 11/21/2022  END OF SESSION:  PT End of Session - 11/21/22 1557     Visit Number 3    Number of Visits 16    Date for PT Re-Evaluation 12/25/22    Authorization Type [employer] MEDIC[employer] CHOICE PPO 16 visits 9/4-10/30    PT Start Time 1555   25 min late   PT Stop Time 1615    PT Time Calculation (min) 20 min    Activity Tolerance Patient tolerated treatment well             Past Medical History:  Diagnosis Date   Allergic rhinitis    Anxiety    Benzodiazepine dependence (HCC)    Bruxism    CAD (coronary artery disease)    S/p NSTEMI 01/2018 >> LHC: oLAD 20, pLAD 20; oD1 20, oLCx 40, pRCA 99 >> PCI: DES to prox RCA // Echo 01/2018: EF 50-55; prob inf-lat and inf HK   Chronic diastolic heart failure    Echocardiogram 07/2018: EF 55-60, grade 1 diastolic dysfunction, normal wall motion, normal GLS (-22.3), PASP 28   Chronic foot pain    bunions, torn ligaments-on chronic pain medication   CTS (carpal tunnel syndrome) 06/08/2014   Depression    High cholesterol    Hypertension    Low blood potassium    MDD (major depressive disorder) 10/02/2017   Migraine    Myocardial infarction (HCC) 02/01/2018   Pneumonia 1986   left lung   RA (rheumatoid arthritis) (HCC)    Past Surgical History:  Procedure Laterality Date   BTL  2000   CARPAL TUNNEL RELEASE Left    CESAREAN SECTION  1978. 1983   CORONARY STENT INTERVENTION N/A 01/27/2018   Procedure: CORONARY STENT INTERVENTION;  Surgeon: Kathleene Hazel, MD;  Location: [facility];  Service: Cardiovascular;  Laterality: N/A;   eye implants     FOOT SURGERY Left 2008   front teeth removed after injury     KNEE ARTHROSCOPY WITH LATERAL MENISECTOMY Left 02/02/2019   Procedure: LEFT KNEE ARTHROSCOPY, MEDIAL AND LATERAL MENISECTOMY, EXCISION OF LEFT LATERAL MENISCAL CYST;   Surgeon: Valeria Batman, MD;  Location: [facility];  Service: Orthopedics;  Laterality: Left;   LEFT HEART CATH AND CORONARY ANGIOGRAPHY N/A 01/27/2018   Procedure: LEFT HEART CATH AND CORONARY ANGIOGRAPHY;  Surgeon: Kathleene Hazel, MD;  Location: [facility];  Service: Cardiovascular;  Laterality: N/A;   pre cancerous lesion removed from forehead     TOTAL ABDOMINAL HYSTERECTOMY W/ BILATERAL SALPINGOOPHORECTOMY  2002   Dr. Jackelyn Knife   VAGINAL HYSTERECTOMY  2001   Patient Active Problem List   Diagnosis Date Noted   Hypocalcemia 08/21/2022   Falls, initial encounter 08/21/2022   Prolonged QT interval 08/21/2022   Acute metabolic encephalopathy 08/21/2022   Essential hypertension 08/21/2022   AKI (acute kidney injury) (HCC) 08/21/2022   Chronic pain 08/21/2022   Chronic diastolic CHF (congestive heart failure) (HCC) 08/21/2022   Effusion, left knee 06/12/2022   Leg edema, left 12/25/2021   Claw toe, left 08/07/2021   Dog bite 03/05/2021   High risk medication use 02/05/2021   Dry mouth 02/05/2021   Pain in left foot 01/31/2021   Bunion of great toe of right foot 06/15/2020   Hypoxia 03/05/2019   Derangement of posterior horn of medial meniscus 02/02/2019   Derangement of posterior horn  of lateral meniscus 02/02/2019   Meniscal cyst, unspecified laterality 12/01/2018   Torn medial meniscus 12/01/2018   Primary osteoarthritis of left knee 12/01/2018   Coronary artery disease involving native coronary artery of native heart without angina pectoris 09/14/2018   (HFpEF) heart failure with preserved ejection fraction (HCC)    Chronic pain of left knee 05/15/2018   Mass of left knee 05/15/2018   Non-ST elevation (NSTEMI) myocardial infarction [facility])    Chest pain 01/25/2018   RA (rheumatoid arthritis) (HCC) 01/25/2018   Hyperlipidemia 01/25/2018   Hypokalemia 01/25/2018   Depression with anxiety 01/25/2018   Tobacco abuse 01/25/2018   Lower extremity cellulitis  01/25/2018   Benzodiazepine dependence (HCC)    MDD (major depressive disorder), recurrent severe, without psychosis (HCC) 10/02/2017   CTS (carpal tunnel syndrome) 06/08/2014    PCP: Camie Patience, FNP   REFERRING PROVIDER: Dr. Sheliah Hatch  REFERRING DIAG: Left knee osteoarthritis   THERAPY DIAG:  Acute pain of left knee  Difficulty in walking, not elsewhere classified  Muscle weakness (generalized)  Rationale for Evaluation and Treatment: Rehabilitation  ONSET DATE: 09/26/22  SUBJECTIVE:   SUBJECTIVE STATEMENT: Patient late b/c she is coming from another appt.  She states after she saw her knee in the mirror last time she cried.    PERTINENT HISTORY: RA PAIN:  Are you having pain? Yes: NPRS scale: 5/10 Pain location: left knee Pain description: sharp, aching Aggravating factors: walking Relieving factors: rest, Tylenol, has bed that she can elevate her legs  PRECAUTIONS: Other: RA, Fall  RED FLAGS: None   WEIGHT BEARING RESTRICTIONS: No  FALLS:  Has patient fallen in last 6 months? Yes. Number of falls 20 per patient   She explains that she was having a neurological decline due to Potassium was at 2 when she got to hospital  LIVING ENVIRONMENT: Lives with: lives with their spouse Lives in: House/apartment Stairs: Yes: Internal: 15 steps; on right going up and External: 4 steps; on right going up Has following equipment at home: Single point cane  OCCUPATION: retired  PLOF: Independent, Independent with basic ADLs, Independent with household mobility with device, Independent with community mobility with device, Independent with transfers, Requires assistive device for independence, Needs assistance with ADLs, Needs assistance with homemaking, and Needs assistance with gait  PATIENT GOALS: to avoid knee replacement  NEXT MD VISIT: prn  OBJECTIVE:    PATIENT SURVEYS:  FOTO 24, predicted 48  COGNITION: Overall cognitive status: Within functional  limits for tasks assessed     SENSATION: WFL  EDEMA:  Circumferential: 40.8 cm   POSTURE:  Severe left knee valgus deformity   LOWER EXTREMITY ROM:  WNL  LOWER EXTREMITY MMT:  Generally 3+/5 on knee flexion and extension,  all others generally 4/5  FUNCTIONAL TESTS:  5 times sit to stand: 32.64 sec Timed up and go (TUG): 30.24 sec  GAIT: Distance walked: 30 feet Assistive device utilized: Single point cane Level of assistance: SBA Comments: antalgic, guarded, severe left knee valgus deformity   TODAY'S TREATMENT:       DATE: 11/21/22 Nustep x 5 min  level 2 green machine (PT present to discuss progress and status) Seated heel raises with hand resistance 10x Seated red band clams 10x Seated left hip flexion up and over yardstick on floor 10x Seated LAQ x 2# left 2 sets of 5 Seated red band with slider HS curls (painful) 10x  Discussion of ice for pain and edema control  Reminder to use QC in opposite hand to offload her left knee                                                                                                                   DATE: 11/13/22 Nustep x 6 min 30 sec at level 1 (PT present to discuss progress and status) Lengthy discussion regarding use of walker vs cane and safety issues, also discussed that she has appt with Dr. Lequita Halt for second opinion for possible TKA.  Patient states she is afraid to ask for pain medication because of the stigma of narcotics and she doesn't want to be viewed as a "pain med addict".   Seated LAQ x 20 0# left Seated hip flexion 0# left Seated hip ER 0# left Seated clam with yellow band x 20  Sit to stand x 5 with use of hands Educated on use of ice vs heat and how heat is contraindicated for active RA.    DATE: 10/30/22 Initial eval completed and initiated HEP    PATIENT EDUCATION:  Education details: Initiated HEP, lengthy discussion about fall risk and need for walker,  suggested rollator walker and provided  handouts for exercises and photo of appropriate walker.  Person educated: Patient Education method: Explanation, Demonstration, Verbal cues, and Handouts Education comprehension: verbalized understanding, returned demonstration, and verbal cues required  HOME EXERCISE PROGRAM: Access Code: [facility] URL: [email or web address] Date: 10/30/2022 Prepared by: Mikey Kirschner  Exercises - Supine Quadricep Sets  - 1 x daily - 7 x weekly - 2 sets - 10 reps - Supine Straight Leg Raises  - 1 x daily - 7 x weekly - 2 sets - 10 reps - Seated Long Arc Quad  - 1 x daily - 7 x weekly - 2 sets - 10 reps  ASSESSMENT:  CLINICAL IMPRESSION: The patient reports moderate left knee pain on arrival and throughout treatment session.  She is challenged by the additional of a light weight to LAQ.  HS curls even without resistance are a bit more painful on the anterior aspect of her knee.  Reminders of the value of ice for pain and edema control and the use of her cane in the opposite hand for pain management.    OBJECTIVE IMPAIRMENTS: Abnormal gait, decreased balance, decreased knowledge of condition, difficulty walking, decreased ROM, decreased strength, increased edema, increased fascial restrictions, increased muscle spasms, impaired flexibility, and pain.   ACTIVITY LIMITATIONS: carrying, lifting, bending, sitting, standing, squatting, sleeping, stairs, transfers, bed mobility, bathing, and dressing  PARTICIPATION LIMITATIONS: meal prep, cleaning, laundry, driving, shopping, community activity, yard work, and church  PERSONAL FACTORS: Fitness and 1-2 comorbidities: RA, CAD  are also affecting patient's functional outcome.   REHAB POTENTIAL: Good  CLINICAL DECISION MAKING: Stable/uncomplicated  EVALUATION COMPLEXITY: Low   GOALS: Goals reviewed with patient? Yes  SHORT TERM GOALS: Target date: 11/27/2022  Pain report to be no greater than 4/10  Baseline: Goal status: INITIAL  2.   Patient will be independent with initial HEP  Baseline:  Goal status: INITIAL  3.  Patient to demonstrate proper heel to toe progression on left LE Baseline:  Goal status: INITIAL  LONG TERM GOALS: Target date: 12/25/2022   Patient to report pain no greater than 2/10  Baseline:  Goal status: INITIAL  2.  Patient to be independent with advanced HEP  Baseline:  Goal status: INITIAL  3.  Patient to be able to stand or walk for at least 15 min without leg pain  Baseline:  Goal status: INITIAL  4.  Functional scores to improve by 2-3 seconds Baseline:  Goal status: INITIAL  5.  FOTO to be Baseline:  Goal status: INITIAL  6.  Patient to report 85% improvement in overall symptoms Baseline:  Goal status: INITIAL   PLAN:  PT FREQUENCY: 1-2x/week  PT DURATION: 8 weeks  PLANNED INTERVENTIONS: Therapeutic exercises, Therapeutic activity, Neuromuscular re-education, Balance training, Gait training, Patient/Family education, Self Care, Joint mobilization, Stair training, DME instructions, Aquatic Therapy, Dry Needling, Electrical stimulation, Cryotherapy, Moist heat, Compression bandaging, Splintting, Taping, Vasopneumatic device, Ultrasound, Ionotophoresis 4mg /ml Dexamethasone, Manual therapy, and Re-evaluation  PLAN FOR NEXT SESSION:  Nustep, quad rehab and hip stability training.  Lavinia Sharps, PT 11/21/22 9:43 PM Phone: [phone] Fax: [phone]  [facility] [address], [address] [place], [place] [address] Phone # [phone] Fax [phone]

## 2022-11-27 ENCOUNTER — Ambulatory Visit: Payer: Medicare PPO | Attending: Family Medicine

## 2022-11-27 DIAGNOSIS — M25562 Pain in left knee: Secondary | ICD-10-CM | POA: Diagnosis not present

## 2022-11-27 DIAGNOSIS — R252 Cramp and spasm: Secondary | ICD-10-CM | POA: Insufficient documentation

## 2022-11-27 DIAGNOSIS — M6281 Muscle weakness (generalized): Secondary | ICD-10-CM | POA: Diagnosis not present

## 2022-11-27 DIAGNOSIS — R293 Abnormal posture: Secondary | ICD-10-CM | POA: Insufficient documentation

## 2022-11-27 DIAGNOSIS — R262 Difficulty in walking, not elsewhere classified: Secondary | ICD-10-CM | POA: Diagnosis not present

## 2022-11-27 NOTE — Therapy (Signed)
OUTPATIENT PHYSICAL THERAPY LOWER EXTREMITY TREATMENT   Patient Name: Meghan Schwartz MRN: [id number] DOB:Nov 11, 1950, 72 y.o., female Today's Date: 11/27/2022  END OF SESSION:  PT End of Session - 11/27/22 1457     Visit Number 4    Number of Visits 16    Date for PT Re-Evaluation 12/25/22    Authorization Type [employer] MEDICARE CHOICE PPO 16 visits 9/4-10/30    PT Start Time 1454    PT Stop Time 1530    PT Time Calculation (min) 36 min    Activity Tolerance Patient tolerated treatment well    Behavior During Therapy WFL for tasks assessed/performed             Past Medical History:  Diagnosis Date   Allergic rhinitis    Anxiety    Benzodiazepine dependence (HCC)    Bruxism    CAD (coronary artery disease)    S/p NSTEMI 01/2018 >> LHC: oLAD 20, pLAD 20; oD1 20, oLCx 40, pRCA 99 >> PCI: DES to prox RCA // Echo 01/2018: EF 50-55; prob inf-lat and inf HK   Chronic diastolic heart failure    Echocardiogram 07/2018: EF 55-60, grade 1 diastolic dysfunction, normal wall motion, normal GLS (-22.3), PASP 28   Chronic foot pain    bunions, torn ligaments-on chronic pain medication   CTS (carpal tunnel syndrome) 06/08/2014   Depression    High cholesterol    Hypertension    Low blood potassium    MDD (major depressive disorder) 10/02/2017   Migraine    Myocardial infarction (HCC) 02/01/2018   Pneumonia 1986   left lung   RA (rheumatoid arthritis) (HCC)    Past Surgical History:  Procedure Laterality Date   BTL  2000   CARPAL TUNNEL RELEASE Left    CESAREAN SECTION  1978. 1983   CORONARY STENT INTERVENTION N/A 01/27/2018   Procedure: CORONARY STENT INTERVENTION;  Surgeon: Kathleene Hazel, MD;  Location: [facility];  Service: Cardiovascular;  Laterality: N/A;   eye implants     FOOT SURGERY Left 2008   front teeth removed after injury     KNEE ARTHROSCOPY WITH LATERAL MENISECTOMY Left 02/02/2019   Procedure: LEFT KNEE ARTHROSCOPY, MEDIAL AND LATERAL MENISECTOMY,  EXCISION OF LEFT LATERAL MENISCAL CYST;  Surgeon: Valeria Batman, MD;  Location: [facility];  Service: Orthopedics;  Laterality: Left;   LEFT HEART CATH AND CORONARY ANGIOGRAPHY N/A 01/27/2018   Procedure: LEFT HEART CATH AND CORONARY ANGIOGRAPHY;  Surgeon: Kathleene Hazel, MD;  Location: [facility];  Service: Cardiovascular;  Laterality: N/A;   pre cancerous lesion removed from forehead     TOTAL ABDOMINAL HYSTERECTOMY W/ BILATERAL SALPINGOOPHORECTOMY  2002   Dr. Jackelyn Knife   VAGINAL HYSTERECTOMY  2001   Patient Active Problem List   Diagnosis Date Noted   Hypocalcemia 08/21/2022   Falls, initial encounter 08/21/2022   Prolonged QT interval 08/21/2022   Acute metabolic encephalopathy 08/21/2022   Essential hypertension 08/21/2022   AKI (acute kidney injury) (HCC) 08/21/2022   Chronic pain 08/21/2022   Chronic diastolic CHF (congestive heart failure) (HCC) 08/21/2022   Effusion, left knee 06/12/2022   Leg edema, left 12/25/2021   Claw toe, left 08/07/2021   Dog bite 03/05/2021   High risk medication use 02/05/2021   Dry mouth 02/05/2021   Pain in left foot 01/31/2021   Bunion of great toe of right foot 06/15/2020   Hypoxia 03/05/2019   Derangement of posterior horn of medial meniscus 02/02/2019  Derangement of posterior horn of lateral meniscus 02/02/2019   Meniscal cyst, unspecified laterality 12/01/2018   Torn medial meniscus 12/01/2018   Primary osteoarthritis of left knee 12/01/2018   Coronary artery disease involving native coronary artery of native heart without angina pectoris 09/14/2018   (HFpEF) heart failure with preserved ejection fraction (HCC)    Chronic pain of left knee 05/15/2018   Mass of left knee 05/15/2018   Non-ST elevation (NSTEMI) myocardial infarction [facility])    Chest pain 01/25/2018   RA (rheumatoid arthritis) (HCC) 01/25/2018   Hyperlipidemia 01/25/2018   Hypokalemia 01/25/2018   Depression with anxiety 01/25/2018   Tobacco abuse  01/25/2018   Lower extremity cellulitis 01/25/2018   Benzodiazepine dependence (HCC)    MDD (major depressive disorder), recurrent severe, without psychosis (HCC) 10/02/2017   CTS (carpal tunnel syndrome) 06/08/2014    PCP: Camie Patience, FNP   REFERRING PROVIDER: Dr. Sheliah Hatch  REFERRING DIAG: Left knee osteoarthritis   THERAPY DIAG:  Acute pain of left knee  Difficulty in walking, not elsewhere classified  Muscle weakness (generalized)  Cramp and spasm  Abnormal posture  Rationale for Evaluation and Treatment: Rehabilitation  ONSET DATE: 09/26/22  SUBJECTIVE:   SUBJECTIVE STATEMENT: Patient 12 min late for appt. No new complaints of pain.     PERTINENT HISTORY: RA PAIN:  Are you having pain? Yes: NPRS scale: 5/10 Pain location: left knee Pain description: sharp, aching Aggravating factors: walking Relieving factors: rest, Tylenol, has bed that she can elevate her legs  PRECAUTIONS: Other: RA, Fall  RED FLAGS: None   WEIGHT BEARING RESTRICTIONS: No  FALLS:  Has patient fallen in last 6 months? Yes. Number of falls 20 per patient   She explains that she was having a neurological decline due to Potassium was at 2 when she got to hospital  LIVING ENVIRONMENT: Lives with: lives with their spouse Lives in: House/apartment Stairs: Yes: Internal: 15 steps; on right going up and External: 4 steps; on right going up Has following equipment at home: Single point cane  OCCUPATION: retired  PLOF: Independent, Independent with basic ADLs, Independent with household mobility with device, Independent with community mobility with device, Independent with transfers, Requires assistive device for independence, Needs assistance with ADLs, Needs assistance with homemaking, and Needs assistance with gait  PATIENT GOALS: to avoid knee replacement  NEXT MD VISIT: prn  OBJECTIVE:    PATIENT SURVEYS:  FOTO 24, predicted 48  COGNITION: Overall cognitive status:  Within functional limits for tasks assessed     SENSATION: WFL  EDEMA:  Circumferential: 40.8 cm   POSTURE:  Severe left knee valgus deformity   LOWER EXTREMITY ROM:  WNL  LOWER EXTREMITY MMT:  Generally 3+/5 on knee flexion and extension,  all others generally 4/5  FUNCTIONAL TESTS:  5 times sit to stand: 32.64 sec Timed up and go (TUG): 30.24 sec  GAIT: Distance walked: 30 feet Assistive device utilized: Single point cane Level of assistance: SBA Comments: antalgic, guarded, severe left knee valgus deformity   TODAY'S TREATMENT:       DATE: 11/27/22 Nustep x 5 min  level 3 blue machine (PT present to discuss progress and status) Seated toe and heel raises x 20 each Seated red band clams 2 x 10 Seated left hip flexion up and over cane on floor 10x each LE Seated LAQ x 0# left 2 sets of 10 (added purple ball for isometric adduction while kicking out) Seated hip adduction x 20 Seated red band with  slider HS curls (non painful today) 2 x 10 Quad set x 20 Quad set with small noodle under left knee x 20 SAQ with soft blue foam roller x 20 (patient c/o pain with this one, held after approx 10 reps) Discussed importance of propping for extension to avoid knee flexion contractures.                                                                                                 DATE: 11/21/22 Nustep x 5 min  level 2 green machine (PT present to discuss progress and status) Seated heel raises with hand resistance 10x Seated red band clams 10x Seated left hip flexion up and over yardstick on floor 10x Seated LAQ x 2# left 2 sets of 5 Seated red band with slider HS curls (painful) 10x  Discussion of ice for pain and edema control     Reminder to use QC in opposite hand to offload her left knee                                                                                                                   DATE: 11/13/22 Nustep x 6 min 30 sec at level 1 (PT present to discuss  progress and status) Lengthy discussion regarding use of walker vs cane and safety issues, also discussed that she has appt with Dr. Lequita Halt for second opinion for possible TKA.  Patient states she is afraid to ask for pain medication because of the stigma of narcotics and she doesn't want to be viewed as a "pain med addict".   Seated LAQ x 20 0# left Seated hip flexion 0# left Seated hip ER 0# left Seated clam with yellow band x 20  Sit to stand x 5 with use of hands Educated on use of ice vs heat and how heat is contraindicated for active RA.    DATE: 10/30/22 Initial eval completed and initiated HEP    PATIENT EDUCATION:  Education details: Initiated HEP, lengthy discussion about fall risk and need for walker,  suggested rollator walker and provided handouts for exercises and photo of appropriate walker.  Person educated: Patient Education method: Explanation, Demonstration, Verbal cues, and Handouts Education comprehension: verbalized understanding, returned demonstration, and verbal cues required  HOME EXERCISE PROGRAM: Access Code: [facility] URL: [email or web address] Date: 10/30/2022 Prepared by: Mikey Kirschner  Exercises - Supine Quadricep Sets  - 1 x daily - 7 x weekly - 2 sets - 10 reps - Supine Straight Leg Raises  - 1 x daily - 7 x weekly - 2 sets - 10 reps - Seated Long Arc Quad  -  1 x daily - 7 x weekly - 2 sets - 10 reps  ASSESSMENT:  CLINICAL IMPRESSION: Patient struggles with any WB activities.  However, she was able to complete all protected quad activity.  She was late for her appt and has been late for several appts.  This impedes the amount of time we are able to spend in treatment.  She would benefit from continued skilled PT to meet below mentioned goals.    OBJECTIVE IMPAIRMENTS: Abnormal gait, decreased balance, decreased knowledge of condition, difficulty walking, decreased ROM, decreased strength, increased edema, increased fascial  restrictions, increased muscle spasms, impaired flexibility, and pain.   ACTIVITY LIMITATIONS: carrying, lifting, bending, sitting, standing, squatting, sleeping, stairs, transfers, bed mobility, bathing, and dressing  PARTICIPATION LIMITATIONS: meal prep, cleaning, laundry, driving, shopping, community activity, yard work, and church  PERSONAL FACTORS: Fitness and 1-2 comorbidities: RA, CAD  are also affecting patient's functional outcome.   REHAB POTENTIAL: Good  CLINICAL DECISION MAKING: Stable/uncomplicated  EVALUATION COMPLEXITY: Low   GOALS: Goals reviewed with patient? Yes  SHORT TERM GOALS: Target date: 11/27/2022  Pain report to be no greater than 4/10  Baseline: Goal status: INITIAL  2.  Patient will be independent with initial HEP  Baseline:  Goal status: INITIAL  3.  Patient to demonstrate proper heel to toe progression on left LE Baseline:  Goal status: INITIAL  LONG TERM GOALS: Target date: 12/25/2022   Patient to report pain no greater than 2/10  Baseline:  Goal status: INITIAL  2.  Patient to be independent with advanced HEP  Baseline:  Goal status: INITIAL  3.  Patient to be able to stand or walk for at least 15 min without leg pain  Baseline:  Goal status: INITIAL  4.  Functional scores to improve by 2-3 seconds Baseline:  Goal status: INITIAL  5.  FOTO to be Baseline:  Goal status: INITIAL  6.  Patient to report 85% improvement in overall symptoms Baseline:  Goal status: INITIAL   PLAN:  PT FREQUENCY: 1-2x/week  PT DURATION: 8 weeks  PLANNED INTERVENTIONS: Therapeutic exercises, Therapeutic activity, Neuromuscular re-education, Balance training, Gait training, Patient/Family education, Self Care, Joint mobilization, Stair training, DME instructions, Aquatic Therapy, Dry Needling, Electrical stimulation, Cryotherapy, Moist heat, Compression bandaging, Splintting, Taping, Vasopneumatic device, Ultrasound, Ionotophoresis 4mg /ml  Dexamethasone, Manual therapy, and Re-evaluation  PLAN FOR NEXT SESSION:  Nustep, quad rehab and hip stability training.  Victorino Dike B. Iyanah Demont, PT 11/27/22 9:41 PM [facility] [address], [address] [place], [place] [address] Phone # [phone] Fax [phone]

## 2022-12-04 ENCOUNTER — Ambulatory Visit: Payer: Medicare PPO

## 2022-12-04 DIAGNOSIS — R252 Cramp and spasm: Secondary | ICD-10-CM | POA: Diagnosis not present

## 2022-12-04 DIAGNOSIS — R262 Difficulty in walking, not elsewhere classified: Secondary | ICD-10-CM | POA: Diagnosis not present

## 2022-12-04 DIAGNOSIS — R293 Abnormal posture: Secondary | ICD-10-CM

## 2022-12-04 DIAGNOSIS — M25562 Pain in left knee: Secondary | ICD-10-CM

## 2022-12-04 DIAGNOSIS — M6281 Muscle weakness (generalized): Secondary | ICD-10-CM

## 2022-12-04 NOTE — Therapy (Unsigned)
OUTPATIENT PHYSICAL THERAPY LOWER EXTREMITY TREATMENT   Patient Name: RHODESIA BRANDLE MRN: [id number] DOB:[birth year], 72 y.o., female Today's Date: 12/04/2022  END OF SESSION:    Past Medical History:  Diagnosis Date   Allergic rhinitis    Anxiety    Benzodiazepine dependence (HCC)    Bruxism    CAD (coronary artery disease)    S/p NSTEMI 01/2018 >> LHC: oLAD 20, pLAD 20; oD1 20, oLCx 40, pRCA 99 >> PCI: DES to prox RCA // Echo 01/2018: EF 50-55; prob inf-lat and inf HK   Chronic diastolic heart failure    Echocardiogram 07/2018: EF 55-60, grade 1 diastolic dysfunction, normal wall motion, normal GLS (-22.3), PASP 28   Chronic foot pain    bunions, torn ligaments-on chronic pain medication   CTS (carpal tunnel syndrome) 06/08/2014   Depression    High cholesterol    Hypertension    Low blood potassium    MDD (major depressive disorder) 10/02/2017   Migraine    Myocardial infarction (HCC) 02/01/2018   Pneumonia 1986   left lung   RA (rheumatoid arthritis) (HCC)    Past Surgical History:  Procedure Laterality Date   BTL  2000   CARPAL TUNNEL RELEASE Left    CESAREAN SECTION  1978. 1983   CORONARY STENT INTERVENTION N/A 01/27/2018   Procedure: CORONARY STENT INTERVENTION;  Surgeon: Kathleene Hazel, MD;  Location: [facility];  Service: Cardiovascular;  Laterality: N/A;   eye implants     FOOT SURGERY Left 2008   front teeth removed after injury     KNEE ARTHROSCOPY WITH LATERAL MENISECTOMY Left 02/02/2019   Procedure: LEFT KNEE ARTHROSCOPY, MEDIAL AND LATERAL MENISECTOMY, EXCISION OF LEFT LATERAL MENISCAL CYST;  Surgeon: Valeria Batman, MD;  Location: [facility];  Service: Orthopedics;  Laterality: Left;   LEFT HEART CATH AND CORONARY ANGIOGRAPHY N/A 01/27/2018   Procedure: LEFT HEART CATH AND CORONARY ANGIOGRAPHY;  Surgeon: Kathleene Hazel, MD;  Location: [facility];  Service: Cardiovascular;  Laterality: N/A;   pre cancerous lesion removed from  forehead     TOTAL ABDOMINAL HYSTERECTOMY W/ BILATERAL SALPINGOOPHORECTOMY  2002   Dr. Jackelyn Knife   VAGINAL HYSTERECTOMY  2001   Patient Active Problem List   Diagnosis Date Noted   Hypocalcemia 08/21/2022   Falls, initial encounter 08/21/2022   Prolonged QT interval 08/21/2022   Acute metabolic encephalopathy 08/21/2022   Essential hypertension 08/21/2022   AKI (acute kidney injury) (HCC) 08/21/2022   Chronic pain 08/21/2022   Chronic diastolic CHF (congestive heart failure) (HCC) 08/21/2022   Effusion, left knee 06/12/2022   Leg edema, left 12/25/2021   Claw toe, left 08/07/2021   Dog bite 03/05/2021   High risk medication use 02/05/2021   Dry mouth 02/05/2021   Pain in left foot 01/31/2021   Bunion of great toe of right foot 06/15/2020   Hypoxia 03/05/2019   Derangement of posterior horn of medial meniscus 02/02/2019   Derangement of posterior horn of lateral meniscus 02/02/2019   Meniscal cyst, unspecified laterality 12/01/2018   Torn medial meniscus 12/01/2018   Primary osteoarthritis of left knee 12/01/2018   Coronary artery disease involving native coronary artery of native heart without angina pectoris 09/14/2018   (HFpEF) heart failure with preserved ejection fraction (HCC)    Chronic pain of left knee 05/15/2018   Mass of left knee 05/15/2018   Non-ST elevation (NSTEMI) myocardial infarction [facility])    Chest pain 01/25/2018   RA (rheumatoid arthritis) (HCC)  01/25/2018   Hyperlipidemia 01/25/2018   Hypokalemia 01/25/2018   Depression with anxiety 01/25/2018   Tobacco abuse 01/25/2018   Lower extremity cellulitis 01/25/2018   Benzodiazepine dependence (HCC)    MDD (major depressive disorder), recurrent severe, without psychosis (HCC) 10/02/2017   CTS (carpal tunnel syndrome) 06/08/2014    PCP: Camie Patience, FNP   REFERRING PROVIDER: Dr. Sheliah Hatch  REFERRING DIAG: Left knee osteoarthritis   THERAPY DIAG:  Acute pain of left knee  Difficulty in  walking, not elsewhere classified  Muscle weakness (generalized)  Cramp and spasm  Abnormal posture  Rationale for Evaluation and Treatment: Rehabilitation  ONSET DATE: 09/26/22  SUBJECTIVE:   SUBJECTIVE STATEMENT: Patient 12 min late for appt. No new complaints of pain.     PERTINENT HISTORY: RA PAIN:  Are you having pain? Yes: NPRS scale: 5/10 Pain location: left knee Pain description: sharp, aching Aggravating factors: walking Relieving factors: rest, Tylenol, has bed that she can elevate her legs  PRECAUTIONS: Other: RA, Fall  RED FLAGS: None   WEIGHT BEARING RESTRICTIONS: No  FALLS:  Has patient fallen in last 6 months? Yes. Number of falls 20 per patient   She explains that she was having a neurological decline due to Potassium was at 2 when she got to hospital  LIVING ENVIRONMENT: Lives with: lives with their spouse Lives in: House/apartment Stairs: Yes: Internal: 15 steps; on right going up and External: 4 steps; on right going up Has following equipment at home: Single point cane  OCCUPATION: retired  PLOF: Independent, Independent with basic ADLs, Independent with household mobility with device, Independent with community mobility with device, Independent with transfers, Requires assistive device for independence, Needs assistance with ADLs, Needs assistance with homemaking, and Needs assistance with gait  PATIENT GOALS: to avoid knee replacement  NEXT MD VISIT: prn  OBJECTIVE:    PATIENT SURVEYS:  FOTO 24, predicted 48  COGNITION: Overall cognitive status: Within functional limits for tasks assessed     SENSATION: WFL  EDEMA:  Circumferential: 40.8 cm   POSTURE:  Severe left knee valgus deformity   LOWER EXTREMITY ROM:  WNL  LOWER EXTREMITY MMT:  Generally 3+/5 on knee flexion and extension,  all others generally 4/5  FUNCTIONAL TESTS:  5 times sit to stand: 32.64 sec Timed up and go (TUG): 30.24 sec  GAIT: Distance walked: 30  feet Assistive device utilized: Single point cane Level of assistance: SBA Comments: antalgic, guarded, severe left knee valgus deformity   TODAY'S TREATMENT:       DATE: 12/04/22 (patient 15 min late for appt) Nustep x 5 min  level 3 blue machine (PT present to discuss progress and status) Seated toe and heel raises x 20 each Seated red band clams 2 x 10 Seated left hip flexion up and over hurdle on floor 10x each LE Seated LAQ x 0# left 2 sets of 10 (added purple ball for isometric adduction while kicking out) Seated hip adduction x 20 Seated red band with slider HS curls (non painful today) 2 x 10 Quad set x 20 Quad set with small noodle under left knee x 20 SAQ with soft blue foam roller x 20 (patient c/o pain with this one, held after approx 10 reps) Discussed importance of propping for extension to avoid knee flexion contractures.  DATE: 11/27/22 Nustep x 5 min  level 3 blue machine (PT present to discuss progress and status) Seated toe and heel raises x 20 each Seated red band clams 2 x 10 Seated left hip flexion up and over cane on floor 10x each LE Seated LAQ x 0# left 2 sets of 10 (added purple ball for isometric adduction while kicking out) Seated hip adduction x 20 Seated red band with slider HS curls (non painful today) 2 x 10 Quad set x 20 Quad set with small noodle under left knee x 20 SAQ with soft blue foam roller x 20 (patient c/o pain with this one, held after approx 10 reps) Discussed importance of propping for extension to avoid knee flexion contractures.                                                                                                 DATE: 11/21/22 Nustep x 5 min  level 2 green machine (PT present to discuss progress and status) Seated heel raises with hand resistance 10x Seated red band clams 10x Seated left hip flexion up and over yardstick on floor  10x Seated LAQ x 2# left 2 sets of 5 Seated red band with slider HS curls (painful) 10x  Discussion of ice for pain and edema control     Reminder to use QC in opposite hand to offload her left knee                                                                                                                   DATE: 11/13/22 Nustep x 6 min 30 sec at level 1 (PT present to discuss progress and status) Lengthy discussion regarding use of walker vs cane and safety issues, also discussed that she has appt with Dr. Lequita Halt for second opinion for possible TKA.  Patient states she is afraid to ask for pain medication because of the stigma of narcotics and she doesn't want to be viewed as a "pain med addict".   Seated LAQ x 20 0# left Seated hip flexion 0# left Seated hip ER 0# left Seated clam with yellow band x 20  Sit to stand x 5 with use of hands Educated on use of ice vs heat and how heat is contraindicated for active RA.    DATE: 10/30/22 Initial eval completed and initiated HEP    PATIENT EDUCATION:  Education details: Initiated HEP, lengthy discussion about fall risk and need for walker,  suggested rollator walker and provided handouts for exercises and photo of appropriate walker.  Person educated: Patient Education method: Explanation, Demonstration, Verbal cues, and Handouts  Education comprehension: verbalized understanding, returned demonstration, and verbal cues required  HOME EXERCISE PROGRAM: Access Code: [facility] URL: [email or web address] Date: 10/30/2022 Prepared by: Mikey Kirschner  Exercises - Supine Quadricep Sets  - 1 x daily - 7 x weekly - 2 sets - 10 reps - Supine Straight Leg Raises  - 1 x daily - 7 x weekly - 2 sets - 10 reps - Seated Long Arc Quad  - 1 x daily - 7 x weekly - 2 sets - 10 reps  ASSESSMENT:  CLINICAL IMPRESSION: Patient struggles with any WB activities.  However, she was able to complete all protected quad activity.  She was late  for her appt and has been late for several appts.  This impedes the amount of time we are able to spend in treatment.  She would benefit from continued skilled PT to meet below mentioned goals.    OBJECTIVE IMPAIRMENTS: Abnormal gait, decreased balance, decreased knowledge of condition, difficulty walking, decreased ROM, decreased strength, increased edema, increased fascial restrictions, increased muscle spasms, impaired flexibility, and pain.   ACTIVITY LIMITATIONS: carrying, lifting, bending, sitting, standing, squatting, sleeping, stairs, transfers, bed mobility, bathing, and dressing  PARTICIPATION LIMITATIONS: meal prep, cleaning, laundry, driving, shopping, community activity, yard work, and church  PERSONAL FACTORS: Fitness and 1-2 comorbidities: RA, CAD  are also affecting patient's functional outcome.   REHAB POTENTIAL: Good  CLINICAL DECISION MAKING: Stable/uncomplicated  EVALUATION COMPLEXITY: Low   GOALS: Goals reviewed with patient? Yes  SHORT TERM GOALS: Target date: 11/27/2022  Pain report to be no greater than 4/10  Baseline: Goal status: INITIAL  2.  Patient will be independent with initial HEP  Baseline:  Goal status: INITIAL  3.  Patient to demonstrate proper heel to toe progression on left LE Baseline:  Goal status: INITIAL  LONG TERM GOALS: Target date: 12/25/2022   Patient to report pain no greater than 2/10  Baseline:  Goal status: INITIAL  2.  Patient to be independent with advanced HEP  Baseline:  Goal status: INITIAL  3.  Patient to be able to stand or walk for at least 15 min without leg pain  Baseline:  Goal status: INITIAL  4.  Functional scores to improve by 2-3 seconds Baseline:  Goal status: INITIAL  5.  FOTO to be Baseline:  Goal status: INITIAL  6.  Patient to report 85% improvement in overall symptoms Baseline:  Goal status: INITIAL   PLAN:  PT FREQUENCY: 1-2x/week  PT DURATION: 8 weeks  PLANNED INTERVENTIONS:  Therapeutic exercises, Therapeutic activity, Neuromuscular re-education, Balance training, Gait training, Patient/Family education, Self Care, Joint mobilization, Stair training, DME instructions, Aquatic Therapy, Dry Needling, Electrical stimulation, Cryotherapy, Moist heat, Compression bandaging, Splintting, Taping, Vasopneumatic device, Ultrasound, Ionotophoresis 4mg /ml Dexamethasone, Manual therapy, and Re-evaluation  PLAN FOR NEXT SESSION:  Nustep, quad rehab and hip stability training.  Victorino Dike B. Brinley Treanor, PT 12/04/22 4:43 PM [facility] [address], [address] [place], [place] [address] Phone # [phone] Fax [phone]

## 2022-12-05 ENCOUNTER — Ambulatory Visit: Payer: Medicare PPO

## 2022-12-05 DIAGNOSIS — F1121 Opioid dependence, in remission: Secondary | ICD-10-CM | POA: Diagnosis not present

## 2022-12-05 DIAGNOSIS — F1320 Sedative, hypnotic or anxiolytic dependence, uncomplicated: Secondary | ICD-10-CM | POA: Diagnosis not present

## 2022-12-05 DIAGNOSIS — F411 Generalized anxiety disorder: Secondary | ICD-10-CM | POA: Diagnosis not present

## 2022-12-05 DIAGNOSIS — F332 Major depressive disorder, recurrent severe without psychotic features: Secondary | ICD-10-CM | POA: Diagnosis not present

## 2022-12-10 ENCOUNTER — Ambulatory Visit: Payer: Medicare PPO

## 2022-12-10 DIAGNOSIS — R262 Difficulty in walking, not elsewhere classified: Secondary | ICD-10-CM

## 2022-12-10 DIAGNOSIS — R252 Cramp and spasm: Secondary | ICD-10-CM

## 2022-12-10 DIAGNOSIS — M25562 Pain in left knee: Secondary | ICD-10-CM | POA: Diagnosis not present

## 2022-12-10 DIAGNOSIS — R293 Abnormal posture: Secondary | ICD-10-CM

## 2022-12-10 DIAGNOSIS — M6281 Muscle weakness (generalized): Secondary | ICD-10-CM

## 2022-12-10 NOTE — Therapy (Signed)
OUTPATIENT PHYSICAL THERAPY LOWER EXTREMITY TREATMENT   Patient Name: Meghan Schwartz MRN: [id number] DOB:02-Nov-1950, 72 y.o., female Today's Date: 12/10/2022  END OF SESSION:  PT End of Session - 12/10/22 1541     Visit Number 6    Number of Visits 16    Date for PT Re-Evaluation 12/25/22    Authorization Type [employer]IC[employer] CHOICE PPO 16 visits 9/4-10/30    Authorization - Visit Number 6    Authorization - Number of Visits 16    Progress Note Due on Visit 10    PT Start Time 1533    PT Stop Time 1612    PT Time Calculation (min) 39 min    Activity Tolerance Patient tolerated treatment well    Behavior During Therapy WFL for tasks assessed/performed              Past Medical History:  Diagnosis Date   Allergic rhinitis    Anxiety    Benzodiazepine dependence (HCC)    Bruxism    CAD (coronary artery disease)    S/p NSTEMI 01/2018 >> LHC: oLAD 20, pLAD 20; oD1 20, oLCx 40, pRCA 99 >> PCI: DES to prox RCA // Echo 01/2018: EF 50-55; prob inf-lat and inf HK   Chronic diastolic heart failure    Echocardiogram 07/2018: EF 55-60, grade 1 diastolic dysfunction, normal wall motion, normal GLS (-22.3), PASP 28   Chronic foot pain    bunions, torn ligaments-on chronic pain medication   CTS (carpal tunnel syndrome) 06/08/2014   Depression    High cholesterol    Hypertension    Low blood potassium    MDD (major depressive disorder) 10/02/2017   Migraine    Myocardial infarction (HCC) 02/01/2018   Pneumonia 1986   left lung   RA (rheumatoid arthritis) (HCC)    Past Surgical History:  Procedure Laterality Date   BTL  2000   CARPAL TUNNEL RELEASE Left    CESAREAN SECTION  1978. 1983   CORONARY STENT INTERVENTION N/A 01/27/2018   Procedure: CORONARY STENT INTERVENTION;  Surgeon: Kathleene Hazel, MD;  Location: [facility];  Service: Cardiovascular;  Laterality: N/A;   eye implants     FOOT SURGERY Left 2008   front teeth removed after injury     KNEE  ARTHROSCOPY WITH LATERAL MENISECTOMY Left 02/02/2019   Procedure: LEFT KNEE ARTHROSCOPY, MEDIAL AND LATERAL MENISECTOMY, EXCISION OF LEFT LATERAL MENISCAL CYST;  Surgeon: Valeria Batman, MD;  Location: [facility];  Service: Orthopedics;  Laterality: Left;   LEFT HEART CATH AND CORONARY ANGIOGRAPHY N/A 01/27/2018   Procedure: LEFT HEART CATH AND CORONARY ANGIOGRAPHY;  Surgeon: Kathleene Hazel, MD;  Location: [facility];  Service: Cardiovascular;  Laterality: N/A;   pre cancerous lesion removed from forehead     TOTAL ABDOMINAL HYSTERECTOMY W/ BILATERAL SALPINGOOPHORECTOMY  2002   Dr. Jackelyn Knife   VAGINAL HYSTERECTOMY  2001   Patient Active Problem List   Diagnosis Date Noted   Hypocalcemia 08/21/2022   Falls, initial encounter 08/21/2022   Prolonged QT interval 08/21/2022   Acute metabolic encephalopathy 08/21/2022   Essential hypertension 08/21/2022   AKI (acute kidney injury) (HCC) 08/21/2022   Chronic pain 08/21/2022   Chronic diastolic CHF (congestive heart failure) (HCC) 08/21/2022   Effusion, left knee 06/12/2022   Leg edema, left 12/25/2021   Claw toe, left 08/07/2021   Dog bite 03/05/2021   High risk medication use 02/05/2021   Dry mouth 02/05/2021   Pain in  left foot 01/31/2021   Bunion of great toe of right foot 06/15/2020   Hypoxia 03/05/2019   Derangement of posterior horn of medial meniscus 02/02/2019   Derangement of posterior horn of lateral meniscus 02/02/2019   Meniscal cyst, unspecified laterality 12/01/2018   Torn medial meniscus 12/01/2018   Primary osteoarthritis of left knee 12/01/2018   Coronary artery disease involving native coronary artery of native heart without angina pectoris 09/14/2018   (HFpEF) heart failure with preserved ejection fraction (HCC)    Chronic pain of left knee 05/15/2018   Mass of left knee 05/15/2018   Non-ST elevation (NSTEMI) myocardial infarction [facility])    Chest pain 01/25/2018   RA (rheumatoid arthritis) (HCC)  01/25/2018   Hyperlipidemia 01/25/2018   Hypokalemia 01/25/2018   Depression with anxiety 01/25/2018   Tobacco abuse 01/25/2018   Lower extremity cellulitis 01/25/2018   Benzodiazepine dependence (HCC)    MDD (major depressive disorder), recurrent severe, without psychosis (HCC) 10/02/2017   CTS (carpal tunnel syndrome) 06/08/2014    PCP: Camie Patience, FNP   REFERRING PROVIDER: Dr. Sheliah Hatch  REFERRING DIAG: Left knee osteoarthritis   THERAPY DIAG:  Acute pain of left knee  Difficulty in walking, not elsewhere classified  Muscle weakness (generalized)  Cramp and spasm  Abnormal posture  Rationale for Evaluation and Treatment: Rehabilitation  ONSET DATE: 09/26/22  SUBJECTIVE:   SUBJECTIVE STATEMENT: Patient on time for appt (about 3 min late).  States she is having more pain from the [facility]' cyst today.  It woke her up last night and has been aching all day.      PERTINENT HISTORY: RA PAIN:  Are you having pain? Yes: NPRS scale: 1-4/10 Pain location: left knee Pain description: sharp, aching Aggravating factors: walking Relieving factors: rest, Tylenol, has bed that she can elevate her legs  PRECAUTIONS: Other: RA, Fall  RED FLAGS: None   WEIGHT BEARING RESTRICTIONS: No  FALLS:  Has patient fallen in last 6 months? Yes. Number of falls 20 per patient   She explains that she was having a neurological decline due to Potassium was at 2 when she got to hospital  LIVING ENVIRONMENT: Lives with: lives with their spouse Lives in: House/apartment Stairs: Yes: Internal: 15 steps; on right going up and External: 4 steps; on right going up Has following equipment at home: Single point cane  OCCUPATION: retired  PLOF: Independent, Independent with basic ADLs, Independent with household mobility with device, Independent with community mobility with device, Independent with transfers, Requires assistive device for independence, Needs assistance with ADLs,  Needs assistance with homemaking, and Needs assistance with gait  PATIENT GOALS: to avoid knee replacement  NEXT MD VISIT: prn  OBJECTIVE:    PATIENT SURVEYS:  FOTO 24, predicted 48  COGNITION: Overall cognitive status: Within functional limits for tasks assessed     SENSATION: WFL  EDEMA:  Circumferential: 40.8 cm   POSTURE:  Severe left knee valgus deformity   LOWER EXTREMITY ROM:  WNL  LOWER EXTREMITY MMT:  Generally 3+/5 on knee flexion and extension,  all others generally 4/5  FUNCTIONAL TESTS:  5 times sit to stand: 32.64 sec Timed up and go (TUG): 30.24 sec  GAIT: Distance walked: 30 feet Assistive device utilized: Single point cane Level of assistance: SBA Comments: antalgic, guarded, severe left knee valgus deformity   TODAY'S TREATMENT:       DATE: 12/10/22  Nustep x 5 min  level 1 blue machine (PT present to discuss progress and status) Seated  toe and heel raises x 20 each Seated red band clams 2 x 10 Seated ball squeezes (purple ball) x 20  Seated left hip flexion up and over hurdle on floor 10x each LE Seated LAQ x 0# left 2 sets of 10  Seated hamstring curl 2 x 10 with red band Seated hip ER 2 x 10 each LE with 0#     Standing hip ER/abd 2 x 10 with green loop                                                      DATE: 12/04/22 (patient 15 min late for appt) Nustep x 5 min  level 3 blue machine (PT present to discuss progress and status) Seated toe and heel raises x 20 each Seated red band clams 2 x 10 Seated left hip flexion up and over hurdle on floor 10x each LE Seated LAQ x 0# left 2 sets of 10 (added purple ball for isometric adduction while kicking out) Seated hip adduction x 20 Seated red band with slider HS curls (non painful today) 2 x 10 Educated on use of handbrakes on walker.  Patient did not know how to lock the brakes for sitting on seat of walker.  Also instructed patient to be sure she is not leaning fwd and to stay close to  walker, avoiding pushing walker out in front of her too far.                                                                         DATE: 11/27/22 Nustep x 5 min  level 3 blue machine (PT present to discuss progress and status) Seated toe and heel raises x 20 each Seated red band clams 2 x 10 Seated left hip flexion up and over cane on floor 10x each LE Seated LAQ x 0# left 2 sets of 10 (added purple ball for isometric adduction while kicking out) Seated hip adduction x 20 Seated red band with slider HS curls (non painful today) 2 x 10 Quad set x 20 Quad set with small noodle under left knee x 20 SAQ with soft blue foam roller x 20 (patient c/o pain with this one, held after approx 10 reps) Discussed importance of propping for extension to avoid knee flexion contractures.                                                                                                 DATE: 10/30/22 Initial eval completed and initiated HEP    PATIENT EDUCATION:  Education details: Initiated HEP, lengthy discussion about fall risk and need for walker,  suggested rollator walker and provided handouts for exercises  and photo of appropriate walker.  Person educated: Patient Education method: Explanation, Demonstration, Verbal cues, and Handouts Education comprehension: verbalized understanding, returned demonstration, and verbal cues required  HOME EXERCISE PROGRAM: Access Code: [facility] URL: [email or web address] Date: 10/30/2022 Prepared by: Mikey Kirschner  Exercises - Supine Quadricep Sets  - 1 x daily - 7 x weekly - 2 sets - 10 reps - Supine Straight Leg Raises  - 1 x daily - 7 x weekly - 2 sets - 10 reps - Seated Long Arc Quad  - 1 x daily - 7 x weekly - 2 sets - 10 reps  ASSESSMENT:  CLINICAL IMPRESSION: Alishya was on time for her appt today.  She had been having pain in the left posterior knee which started in the middle of the night.  This impeded some of the activities planned  for today.  However, she did very well with the exercises we did today and could possibly progress next visit.  She continues to have severe valgus deformity but is functional ambulator with rollator walker.   She would benefit from skilled PT to address her strength in preparation for possible TKA or , in the event that she does not qualify for surgery, for her to remain functional and avoiding becoming wheelchair bound.     OBJECTIVE IMPAIRMENTS: Abnormal gait, decreased balance, decreased knowledge of condition, difficulty walking, decreased ROM, decreased strength, increased edema, increased fascial restrictions, increased muscle spasms, impaired flexibility, and pain.   ACTIVITY LIMITATIONS: carrying, lifting, bending, sitting, standing, squatting, sleeping, stairs, transfers, bed mobility, bathing, and dressing  PARTICIPATION LIMITATIONS: meal prep, cleaning, laundry, driving, shopping, community activity, yard work, and church  PERSONAL FACTORS: Fitness and 1-2 comorbidities: RA, CAD  are also affecting patient's functional outcome.   REHAB POTENTIAL: Good  CLINICAL DECISION MAKING: Stable/uncomplicated  EVALUATION COMPLEXITY: Low   GOALS: Goals reviewed with patient? Yes  SHORT TERM GOALS: Target date: 11/27/2022  Pain report to be no greater than 4/10  Baseline: Goal status: INITIAL  2.  Patient will be independent with initial HEP  Baseline:  Goal status: INITIAL  3.  Patient to demonstrate proper heel to toe progression on left LE Baseline:  Goal status: INITIAL  LONG TERM GOALS: Target date: 12/25/2022   Patient to report pain no greater than 2/10  Baseline:  Goal status: INITIAL  2.  Patient to be independent with advanced HEP  Baseline:  Goal status: INITIAL  3.  Patient to be able to stand or walk for at least 15 min without leg pain  Baseline:  Goal status: INITIAL  4.  Functional scores to improve by 2-3 seconds Baseline:  Goal status: INITIAL  5.   FOTO to be Baseline:  Goal status: INITIAL  6.  Patient to report 85% improvement in overall symptoms Baseline:  Goal status: INITIAL   PLAN:  PT FREQUENCY: 1-2x/week  PT DURATION: 8 weeks  PLANNED INTERVENTIONS: Therapeutic exercises, Therapeutic activity, Neuromuscular re-education, Balance training, Gait training, Patient/Family education, Self Care, Joint mobilization, Stair training, DME instructions, Aquatic Therapy, Dry Needling, Electrical stimulation, Cryotherapy, Moist heat, Compression bandaging, Splintting, Taping, Vasopneumatic device, Ultrasound, Ionotophoresis 4mg /ml Dexamethasone, Manual therapy, and Re-evaluation  PLAN FOR NEXT SESSION:  Continue Nustep, incorporate gait training and stair training if Bakers cyst pain is better controlled, quad rehab and hip stability training.  Victorino Dike B. Medrith Veillon, PT 12/10/22 4:22 PM [facility] [address] [place], [place] [address] Phone # [phone] Fax [phone]

## 2022-12-12 DIAGNOSIS — H353212 Exudative age-related macular degeneration, right eye, with inactive choroidal neovascularization: Secondary | ICD-10-CM | POA: Diagnosis not present

## 2022-12-12 DIAGNOSIS — Z79899 Other long term (current) drug therapy: Secondary | ICD-10-CM | POA: Diagnosis not present

## 2022-12-12 DIAGNOSIS — H26492 Other secondary cataract, left eye: Secondary | ICD-10-CM | POA: Diagnosis not present

## 2022-12-12 DIAGNOSIS — H43813 Vitreous degeneration, bilateral: Secondary | ICD-10-CM | POA: Diagnosis not present

## 2022-12-12 DIAGNOSIS — M1712 Unilateral primary osteoarthritis, left knee: Secondary | ICD-10-CM | POA: Diagnosis not present

## 2022-12-12 DIAGNOSIS — H43393 Other vitreous opacities, bilateral: Secondary | ICD-10-CM | POA: Diagnosis not present

## 2022-12-12 DIAGNOSIS — H353221 Exudative age-related macular degeneration, left eye, with active choroidal neovascularization: Secondary | ICD-10-CM | POA: Diagnosis not present

## 2022-12-16 ENCOUNTER — Telehealth: Payer: Self-pay | Admitting: Internal Medicine

## 2022-12-17 ENCOUNTER — Ambulatory Visit: Payer: Medicare PPO

## 2022-12-17 DIAGNOSIS — M6281 Muscle weakness (generalized): Secondary | ICD-10-CM | POA: Diagnosis not present

## 2022-12-17 DIAGNOSIS — R252 Cramp and spasm: Secondary | ICD-10-CM | POA: Diagnosis not present

## 2022-12-17 DIAGNOSIS — R293 Abnormal posture: Secondary | ICD-10-CM

## 2022-12-17 DIAGNOSIS — R262 Difficulty in walking, not elsewhere classified: Secondary | ICD-10-CM | POA: Diagnosis not present

## 2022-12-17 DIAGNOSIS — M25562 Pain in left knee: Secondary | ICD-10-CM

## 2022-12-17 NOTE — Therapy (Signed)
OUTPATIENT PHYSICAL THERAPY LOWER EXTREMITY TREATMENT   Patient Name: Meghan Schwartz MRN: [id number] DOB:1951-01-18, 72 y.o., female Today's Date: 12/17/2022  END OF SESSION:  PT End of Session - 12/17/22 1429     Visit Number 7    Number of Visits 16    Date for PT Re-Evaluation 12/25/22    Authorization Type [employer]IC[employer] CHOICE PPO 16 visits 9/4-10/30    Authorization - Visit Number 7    Authorization - Number of Visits 16    Progress Note Due on Visit 10    PT Start Time 1405    PT Stop Time 1452    PT Time Calculation (min) 47 min    Activity Tolerance Patient tolerated treatment well    Behavior During Therapy WFL for tasks assessed/performed              Past Medical History:  Diagnosis Date   Allergic rhinitis    Anxiety    Benzodiazepine dependence (HCC)    Bruxism    CAD (coronary artery disease)    S/p NSTEMI 01/2018 >> LHC: oLAD 20, pLAD 20; oD1 20, oLCx 40, pRCA 99 >> PCI: DES to prox RCA // Echo 01/2018: EF 50-55; prob inf-lat and inf HK   Chronic diastolic heart failure    Echocardiogram 07/2018: EF 55-60, grade 1 diastolic dysfunction, normal wall motion, normal GLS (-22.3), PASP 28   Chronic foot pain    bunions, torn ligaments-on chronic pain medication   CTS (carpal tunnel syndrome) 06/08/2014   Depression    High cholesterol    Hypertension    Low blood potassium    MDD (major depressive disorder) 10/02/2017   Migraine    Myocardial infarction (HCC) 02/01/2018   Pneumonia 1986   left lung   RA (rheumatoid arthritis) (HCC)    Past Surgical History:  Procedure Laterality Date   BTL  2000   CARPAL TUNNEL RELEASE Left    CESAREAN SECTION  1978. 1983   CORONARY STENT INTERVENTION N/A 01/27/2018   Procedure: CORONARY STENT INTERVENTION;  Surgeon: Kathleene Hazel, MD;  Location: [facility];  Service: Cardiovascular;  Laterality: N/A;   eye implants     FOOT SURGERY Left 2008   front teeth removed after injury     KNEE  ARTHROSCOPY WITH LATERAL MENISECTOMY Left 02/02/2019   Procedure: LEFT KNEE ARTHROSCOPY, MEDIAL AND LATERAL MENISECTOMY, EXCISION OF LEFT LATERAL MENISCAL CYST;  Surgeon: Valeria Batman, MD;  Location: [facility];  Service: Orthopedics;  Laterality: Left;   LEFT HEART CATH AND CORONARY ANGIOGRAPHY N/A 01/27/2018   Procedure: LEFT HEART CATH AND CORONARY ANGIOGRAPHY;  Surgeon: Kathleene Hazel, MD;  Location: [facility];  Service: Cardiovascular;  Laterality: N/A;   pre cancerous lesion removed from forehead     TOTAL ABDOMINAL HYSTERECTOMY W/ BILATERAL SALPINGOOPHORECTOMY  2002   Dr. Jackelyn Knife   VAGINAL HYSTERECTOMY  2001   Patient Active Problem List   Diagnosis Date Noted   Hypocalcemia 08/21/2022   Falls, initial encounter 08/21/2022   Prolonged QT interval 08/21/2022   Acute metabolic encephalopathy 08/21/2022   Essential hypertension 08/21/2022   AKI (acute kidney injury) (HCC) 08/21/2022   Chronic pain 08/21/2022   Chronic diastolic CHF (congestive heart failure) (HCC) 08/21/2022   Effusion, left knee 06/12/2022   Leg edema, left 12/25/2021   Claw toe, left 08/07/2021   Dog bite 03/05/2021   High risk medication use 02/05/2021   Dry mouth 02/05/2021   Pain in  left foot 01/31/2021   Bunion of great toe of right foot 06/15/2020   Hypoxia 03/05/2019   Derangement of posterior horn of medial meniscus 02/02/2019   Derangement of posterior horn of lateral meniscus 02/02/2019   Meniscal cyst, unspecified laterality 12/01/2018   Torn medial meniscus 12/01/2018   Primary osteoarthritis of left knee 12/01/2018   Coronary artery disease involving native coronary artery of native heart without angina pectoris 09/14/2018   (HFpEF) heart failure with preserved ejection fraction (HCC)    Chronic pain of left knee 05/15/2018   Mass of left knee 05/15/2018   Non-ST elevation (NSTEMI) myocardial infarction [facility])    Chest pain 01/25/2018   RA (rheumatoid arthritis) (HCC)  01/25/2018   Hyperlipidemia 01/25/2018   Hypokalemia 01/25/2018   Depression with anxiety 01/25/2018   Tobacco abuse 01/25/2018   Lower extremity cellulitis 01/25/2018   Benzodiazepine dependence (HCC)    MDD (major depressive disorder), recurrent severe, without psychosis (HCC) 10/02/2017   CTS (carpal tunnel syndrome) 06/08/2014    PCP: Camie Patience, FNP   REFERRING PROVIDER: Dr. Sheliah Hatch  REFERRING DIAG: Left knee osteoarthritis   THERAPY DIAG:  Acute pain of left knee  Difficulty in walking, not elsewhere classified  Muscle weakness (generalized)  Cramp and spasm  Abnormal posture  Rationale for Evaluation and Treatment: Rehabilitation  ONSET DATE: 09/26/22  SUBJECTIVE:   SUBJECTIVE STATEMENT: Patient on time for appt.  States she saw Dr. Lequita Halt for 2nd opinion and he agrees to do her knee replacement if she is cleared by cardiology.  She states that the surgery scheduled for mid February and she will have to go through screening by cardiologist to insure that she is a candidate.  She explains that she is happy but nervous.   She denies any new problems or issues.    PERTINENT HISTORY: RA PAIN:  Are you having pain? Yes: NPRS scale: 1-4/10 Pain location: left knee Pain description: sharp, aching Aggravating factors: walking Relieving factors: rest, Tylenol, has bed that she can elevate her legs  PRECAUTIONS: Other: RA, Fall  RED FLAGS: None   WEIGHT BEARING RESTRICTIONS: No  FALLS:  Has patient fallen in last 6 months? Yes. Number of falls 20 per patient   She explains that she was having a neurological decline due to Potassium was at 2 when she got to hospital  LIVING ENVIRONMENT: Lives with: lives with their spouse Lives in: House/apartment Stairs: Yes: Internal: 15 steps; on right going up and External: 4 steps; on right going up Has following equipment at home: Single point cane  OCCUPATION: retired  PLOF: Independent, Independent  with basic ADLs, Independent with household mobility with device, Independent with community mobility with device, Independent with transfers, Requires assistive device for independence, Needs assistance with ADLs, Needs assistance with homemaking, and Needs assistance with gait  PATIENT GOALS: to avoid knee replacement  NEXT MD VISIT: prn  OBJECTIVE:    PATIENT SURVEYS:  FOTO 24, predicted 48  COGNITION: Overall cognitive status: Within functional limits for tasks assessed     SENSATION: WFL  EDEMA:  Circumferential: 40.8 cm   POSTURE:  Severe left knee valgus deformity   LOWER EXTREMITY ROM:  WNL  LOWER EXTREMITY MMT:  Generally 3+/5 on knee flexion and extension,  all others generally 4/5  FUNCTIONAL TESTS:  5 times sit to stand: 32.64 sec Timed up and go (TUG): 30.24 sec  GAIT: Distance walked: 30 feet Assistive device utilized: Single point cane Level of assistance: SBA Comments:  antalgic, guarded, severe left knee valgus deformity   TODAY'S TREATMENT:       DATE: 12/17/22  Nustep x 5 min  level 1 green machine (PT present to discuss progress and status) Seated clam x 20 with green loop Seated ball squeeze x 20 Seated ball squeeze with long arc quad with 2 lbs March with 2 lbs x 20  Seated hip ER 2 x 10 each LE with 2#  Seated hamstring curl 2 x 10 with red band Gait training with rollator walker encouraging increased step length and upright posture Lengthy conversation about the possible knee replacement and whether she should continue PT or hold until after surgery.   DATE: 12/10/22  Nustep x 5 min  level 1 blue machine (PT present to discuss progress and status) Seated toe and heel raises x 20 each Seated red band clams 2 x 10 Seated ball squeezes (purple ball) x 20  Seated left hip flexion up and over hurdle on floor 10x each LE Seated LAQ x 0# left 2 sets of 10  Seated hamstring curl 2 x 10 with red band Seated hip ER 2 x 10 each LE with 0#      Standing hip ER/abd 2 x 10 with green loop                                                      DATE: 12/04/22 (patient 15 min late for appt) Nustep x 5 min  level 3 blue machine (PT present to discuss progress and status) Seated toe and heel raises x 20 each Seated red band clams 2 x 10 Seated left hip flexion up and over hurdle on floor 10x each LE Seated LAQ x 0# left 2 sets of 10 (added purple ball for isometric adduction while kicking out) Seated hip adduction x 20 Seated red band with slider HS curls (non painful today) 2 x 10 Educated on use of handbrakes on walker.  Patient did not know how to lock the brakes for sitting on seat of walker.  Also instructed patient to be sure she is not leaning fwd and to stay close to walker, avoiding pushing walker out in front of her too far.                                                                         DATE: 10/30/22 Initial eval completed and initiated HEP    PATIENT EDUCATION:  Education details: Initiated HEP, lengthy discussion about fall risk and need for walker,  suggested rollator walker and provided handouts for exercises and photo of appropriate walker.  Person educated: Patient Education method: Explanation, Demonstration, Verbal cues, and Handouts Education comprehension: verbalized understanding, returned demonstration, and verbal cues required  HOME EXERCISE PROGRAM: Access Code: [facility] URL: [email or web address] Date: 10/30/2022 Prepared by: Mikey Kirschner  Exercises - Supine Quadricep Sets  - 1 x daily - 7 x weekly - 2 sets - 10 reps - Supine Straight Leg Raises  - 1 x daily - 7 x weekly - 2 sets - 10 reps - Seated  Long Arc Quad  - 1 x daily - 7 x weekly - 2 sets - 10 reps  ASSESSMENT:  CLINICAL IMPRESSION: Synquis has received confirmation that Dr. Lequita Halt is willing to do her TKA on the left knee if she is cleared by her cardiologist.  She seems excited but apprehensive due to her cardiac  history.  We discussed this in detail and reminded her that the cardiologist would not clear her if they anticipated that the procedure would be too risky.  We also discussed the need to continue PT to prepare her to be strong enough to tolerate the surgery and have a good outcome.  We discussed that she is already doing much better using the rollator walker.  She ambulates with increased step length and upright posture and is overall safer.  She admits that she feels like she can do more with the walker and she isn't as afraid of falling.  Raedawn was able to tolerate increased resistance on all exercises today.  She is actually fairly strong in her hip flexors bilaterally but is quite weak in hip abductors and ER's.  Latreshia would benefit from continuing HHPT for the remainder of her cert period and authorization.  She would also benefit from ongoing PT for 1 time per week or 1 time every other week up until surgery to prepare her for the surgery.    OBJECTIVE IMPAIRMENTS: Abnormal gait, decreased balance, decreased knowledge of condition, difficulty walking, decreased ROM, decreased strength, increased edema, increased fascial restrictions, increased muscle spasms, impaired flexibility, and pain.   ACTIVITY LIMITATIONS: carrying, lifting, bending, sitting, standing, squatting, sleeping, stairs, transfers, bed mobility, bathing, and dressing  PARTICIPATION LIMITATIONS: meal prep, cleaning, laundry, driving, shopping, community activity, yard work, and church  PERSONAL FACTORS: Fitness and 1-2 comorbidities: RA, CAD  are also affecting patient's functional outcome.   REHAB POTENTIAL: Good  CLINICAL DECISION MAKING: Stable/uncomplicated  EVALUATION COMPLEXITY: Low   GOALS: Goals reviewed with patient? Yes  SHORT TERM GOALS: Target date: 11/27/2022  Pain report to be no greater than 4/10  Baseline: Goal status: MET 12/17/22  2.  Patient will be independent with initial HEP  Baseline:  Goal  status: MET 12/17/22  3.  Patient to demonstrate proper heel to toe progression on left LE Baseline:  Goal status: MET 12/17/22 with rollator walker  LONG TERM GOALS: Target date: 12/25/2022   Patient to report pain no greater than 2/10  Baseline:  Goal status: INITIAL  2.  Patient to be independent with advanced HEP  Baseline:  Goal status: INITIAL  3.  Patient to be able to stand or walk for at least 15 min without leg pain  Baseline:  Goal status: INITIAL  4.  Functional scores to improve by 2-3 seconds Baseline:  Goal status: INITIAL  5.  FOTO to be Baseline:  Goal status: INITIAL  6.  Patient to report 85% improvement in overall symptoms Baseline:  Goal status: INITIAL   PLAN:  PT FREQUENCY: 1-2x/week  PT DURATION: 8 weeks  PLANNED INTERVENTIONS: Therapeutic exercises, Therapeutic activity, Neuromuscular re-education, Balance training, Gait training, Patient/Family education, Self Care, Joint mobilization, Stair training, DME instructions, Aquatic Therapy, Dry Needling, Electrical stimulation, Cryotherapy, Moist heat, Compression bandaging, Splintting, Taping, Vasopneumatic device, Ultrasound, Ionotophoresis 4mg /ml Dexamethasone, Manual therapy, and Re-evaluation  PLAN FOR NEXT SESSION:  Continue Nustep, prepare for upcoming surgery.    Victorino Dike B. Kariana Wiles, PT 12/17/22 3:11 PM [facility] [address], [address] [place], [place] [address] Phone #  [phone] Fax [phone]

## 2022-12-18 ENCOUNTER — Encounter: Payer: Self-pay | Admitting: Internal Medicine

## 2022-12-18 ENCOUNTER — Ambulatory Visit: Payer: Medicare PPO | Attending: Internal Medicine | Admitting: Internal Medicine

## 2022-12-18 VITALS — BP 120/74 | HR 61 | Resp 14 | Ht 64.0 in | Wt 161.0 lb

## 2022-12-18 DIAGNOSIS — Z79899 Other long term (current) drug therapy: Secondary | ICD-10-CM | POA: Diagnosis not present

## 2022-12-18 DIAGNOSIS — M069 Rheumatoid arthritis, unspecified: Secondary | ICD-10-CM

## 2022-12-18 DIAGNOSIS — M1712 Unilateral primary osteoarthritis, left knee: Secondary | ICD-10-CM | POA: Diagnosis not present

## 2022-12-18 NOTE — Progress Notes (Signed)
Office Visit Note  Patient: Meghan Schwartz             Date of Birth: 12-05-1950           MRN: [id number]             PCP: Camie Patience, FNP Referring: Camie Patience, FNP Visit Date: 12/18/2022   Subjective:  Follow-up (Patient states she has a shot in her eye last week. Patient states her left eye developed a film that will have to be removed. )   History of Present Illness: SUTTER DRILLING is a 72 y.o. female here for follow up for rheumatoid arthritis on Enbrel 50 mg subcu weekly and regarding chronic osteoarthritis pain in the left knee.  Again had temporary symptom relief after aspiration injection of knee at her last visit but still has pain and instability and catching while weightbearing.  She developed trouble with her left eye with what she describes as some type of membrane associated with the previous cataract surgery.  Seeing Dr. Dione Booze for management.  She is also discussing knee replacement surgery with Dr. Antony Odea.  They talked at some length regarding she has high pre-existing cardiovascular risk factors for surgery but current function and quality of life is severely impaired and she has had only limited response to numerous injections and oral medications and does not want to go back on high-dose pain medicine.  Previous HPI 10/30/22 YANELY VALLADOLID is a 72 y.o. female here for follow up today for left knee pain and swelling. This is worse especially with pain in the posterior knee in the past week. This was not preceded by any specific injury illness or new activity. She is not wearing the knee brace recommended by Dr. August Saucer because it is too bulky on her swollen knee and hits against the right knee. Also has pain and tenderness in an area below and lateral to the knee that is chronic but worse lately. She remains on enbrel 50 mg [place] weekly without interruption for her RA and no increased swelling elsewhere.   Previous HPI 09/11/22 JIZELLE GRAVIER is a 72 y.o. female here for follow up  for rheumatoid arthritis on Enbrel 50 mg subcu weekly and osteoarthritis of the left knee.  We last saw her in April with repeat aspiration of the knee and popliteal cyst with no steroid injection at the time due to recent treatment.  She feels the cyst and back of knee pain has remained improved since that time.  She had a hospitalization last month due to severe hypokalemia down to 2.0.  Had preceding symptoms of worsening muscle weakness with change in posture and worsening mobility for the preceding weeks until waking up with profound muscle weakness inability to stand and severe constipation.  Had some adjustment with her torsemide and potassium supplementation and now back to about her baseline.   Previous HPI 07/30/22 MARYRITA GRIZZELL is a 72 y.o. female with rheumatoid arthritis on Enbrel 50 mg subcu weekly and osteoarthritis here for left knee joint pain and swelling.  At her last visit she felt there was more improvement in her knee pain with the Baker's cyst aspiration as well as joint injection versus previous aspiration injections at only the suprapatellar pouch.  Subsequently saw Dr. August Saucer for evaluation of her severe primary osteoarthritis of the knee and also underwent joint aspiration that time with 45 cc fluid removed.  He discussed options for her realistically the only available procedure  would be total knee arthroplasty.  She is hesitant about whether or not to pursue this due to associated pain and required rehabilitation and also cardiovascular risk.  Though she was cleared by her cardiologist to proceed with this if she wishes.  Currently has back due to increasing pain and swelling in the knee again and she is interested whether the popliteal cyst could be aspirated or injected again.  She is not noticing increased pain or swelling in other joints besides the knee.  No interruption or change to her Enbrel or other medications. No new infections or injuries.   Previous HPI 06/12/22 JULINA BARSE is a 72 y.o. female here for follow up for RA on Enbrel 50 mg [place] weekly and osteoarthritis with chronic left knee pain and swelling. We aspirated the left knee in March but she saw quick re accumulation of fluid.  Outside of left knee has not experienced much exacerbation of upper extremity pain.  Also describes some clicking or popping sensation frequently occurring and with decreased steadiness on her feet.  She is usually able to walk adequately with some form of assistive device at least a cane.   Previous HPI 05/06/22 LINH DERUYTER is a 72 y.o. female here for follow up for RA on Enbrel 50 mg subcu weekly and osteoarthritis.  Since her last visit she had an issue with worsening pedal edema and a very large associated weight gain.  She was referred to see Dr. Tenny Craw with cardiology.  She was prescribed metolazone to combine with her torsemide and saw a rapid diuresis with 15 pound weight loss in 1 day.  Initially had associated skin peeling since then still has erythema and rough or thick skin over the affected areas but without any reaccumulation of the weight and pitting fluid.  Blood pressure has been somewhat low.  She has had some falls due to instability she is noticing trouble where she cannot apply weight onto her right foot evenly just gets pressure around the base of the second and third toe.  And left knee is causing more problems with reaccumulation of the large effusion and also with posterior swelling from baker's cyst.     Previous HPI 02/05/21 BRYNNA WISNER is a 72 y.o. female her for rheumatoid arthritis on Enbrel 50 mg [place] weekly for which she has been seeing Dr. Nickola Major previously. She was originally diagnosed about 10 or 11 years ago due to development of joint pain, stiffness, synovitis, and progressive joint deformities primarily starting in the feet and knees.  Subsequently proceeded to have increased joint pains involving bilateral hands with some swelling at the PIP and MCP joints.   She started treatment with Enbrel injections with some improvement in the lower extremity joint pains she recalls still having a lot of pain symptoms and pronounced fatigue that did not clear with this treatment.  Combination treatment was tried with methotrexate with significant GI intolerance.  She tried leflunomide but developed substantial alopecia so discontinued the medicine.  She switched to hydroxychloroquine combination treatment which was being well-tolerated but she has had multiple new or worsening medical problems over the past few years with concern of medication related effects. She had hand numbness in the past improved after left carpal tunnel release surgery but she has some residual muscle atrophy in thenar prominence. She had left knee meniscectomy in 2020. She suffered NSTEMI in 01/2018 with DES placement in RCA for 99% blockage and has chronic diastolic CHF. Her edema is  generally controlled while taking torsemide but does rapidly re accumulate fluid off this medication. Ophthalmology evaluation indicated significant visual field deficits findings consistent with age-related macular degeneration of both eyes and recommended avoidance of this medication.  She also has considerable osteoarthritis especially in her knees and feet previous surgical reconstruction on the left foot. Overall she feels her gait is not very stable due to these problems.   DMARD Hx Enbrel - current   HCQ - macular disease? LEF - alopecia MTX - GI intolerance.    Review of Systems  Constitutional:  Positive for fatigue.  HENT:  Positive for mouth dryness. Negative for mouth sores.   Eyes:  Positive for dryness.  Respiratory:  Positive for shortness of breath.   Cardiovascular:  Negative for chest pain and palpitations.  Gastrointestinal:  Positive for constipation and diarrhea. Negative for blood in stool.  Endocrine: Positive for increased urination.  Genitourinary:  Positive for involuntary  urination.  Musculoskeletal:  Positive for joint pain, gait problem, joint pain, joint swelling, myalgias, muscle weakness, morning stiffness, muscle tenderness and myalgias.  Skin:  Positive for sensitivity to sunlight. Negative for color change, rash and hair loss.  Allergic/Immunologic: Positive for susceptible to infections.  Neurological:  Positive for dizziness and headaches.  Hematological:  Negative for swollen glands.  Psychiatric/Behavioral:  Positive for depressed mood. Negative for sleep disturbance. The patient is nervous/anxious.     PMFS History:  Patient Active Problem List   Diagnosis Date Noted   Hypocalcemia 08/21/2022   Falls, initial encounter 08/21/2022   Prolonged QT interval 08/21/2022   Acute metabolic encephalopathy 08/21/2022   Essential hypertension 08/21/2022   AKI (acute kidney injury) (HCC) 08/21/2022   Chronic pain 08/21/2022   Chronic diastolic CHF (congestive heart failure) (HCC) 08/21/2022   Effusion, left knee 06/12/2022   Leg edema, left 12/25/2021   Claw toe, left 08/07/2021   Dog bite 03/05/2021   High risk medication use 02/05/2021   Dry mouth 02/05/2021   Pain in left foot 01/31/2021   Bunion of great toe of right foot 06/15/2020   Hypoxia 03/05/2019   Derangement of posterior horn of medial meniscus 02/02/2019   Derangement of posterior horn of lateral meniscus 02/02/2019   Meniscal cyst, unspecified laterality 12/01/2018   Torn medial meniscus 12/01/2018   Primary osteoarthritis of left knee 12/01/2018   Coronary artery disease involving native coronary artery of native heart without angina pectoris 09/14/2018   (HFpEF) heart failure with preserved ejection fraction (HCC)    Chronic pain of left knee 05/15/2018   Mass of left knee 05/15/2018   Non-ST elevation (NSTEMI) myocardial infarction [facility])    Chest pain 01/25/2018   RA (rheumatoid arthritis) (HCC) 01/25/2018   Hyperlipidemia 01/25/2018   Hypokalemia 01/25/2018   Depression  with anxiety 01/25/2018   Tobacco abuse 01/25/2018   Lower extremity cellulitis 01/25/2018   Benzodiazepine dependence (HCC)    MDD (major depressive disorder), recurrent severe, without psychosis (HCC) 10/02/2017   CTS (carpal tunnel syndrome) 06/08/2014    Past Medical History:  Diagnosis Date   Allergic rhinitis    Anxiety    Benzodiazepine dependence (HCC)    Bruxism    CAD (coronary artery disease)    S/p NSTEMI 01/2018 >> LHC: oLAD 20, pLAD 20; oD1 20, oLCx 40, pRCA 99 >> PCI: DES to prox RCA // Echo 01/2018: EF 50-55; prob inf-lat and inf HK   Chronic diastolic heart failure    Echocardiogram 07/2018: EF 55-60, grade 1 diastolic dysfunction,  normal wall motion, normal GLS (-22.3), PASP 28   Chronic foot pain    bunions, torn ligaments-on chronic pain medication   CTS (carpal tunnel syndrome) 06/08/2014   Depression    High cholesterol    Hypertension    Low blood potassium    MDD (major depressive disorder) 10/02/2017   Migraine    Myocardial infarction (HCC) 02/01/2018   Pneumonia 1986   left lung   RA (rheumatoid arthritis) (HCC)     Family History  Problem Relation Age of Onset   Thyroid disease Mother    Breast cancer Mother    Parkinson's disease Father    Raynaud syndrome Maternal Aunt    Arthritis Maternal Grandmother    Heart attack Maternal Grandmother    Stroke Paternal Grandmother    Heart attack Paternal Grandfather    Past Surgical History:  Procedure Laterality Date   BTL  2000   CARPAL TUNNEL RELEASE Left    CESAREAN SECTION  1978. 1983   CORONARY STENT INTERVENTION N/A 01/27/2018   Procedure: CORONARY STENT INTERVENTION;  Surgeon: Kathleene Hazel, MD;  Location: [facility];  Service: Cardiovascular;  Laterality: N/A;   eye implants     FOOT SURGERY Left 2008   front teeth removed after injury     KNEE ARTHROSCOPY WITH LATERAL MENISECTOMY Left 02/02/2019   Procedure: LEFT KNEE ARTHROSCOPY, MEDIAL AND LATERAL MENISECTOMY, EXCISION  OF LEFT LATERAL MENISCAL CYST;  Surgeon: Valeria Batman, MD;  Location: [facility];  Service: Orthopedics;  Laterality: Left;   LEFT HEART CATH AND CORONARY ANGIOGRAPHY N/A 01/27/2018   Procedure: LEFT HEART CATH AND CORONARY ANGIOGRAPHY;  Surgeon: Kathleene Hazel, MD;  Location: [facility];  Service: Cardiovascular;  Laterality: N/A;   pre cancerous lesion removed from forehead     TOTAL ABDOMINAL HYSTERECTOMY W/ BILATERAL SALPINGOOPHORECTOMY  2002   Dr. Jackelyn Knife   VAGINAL HYSTERECTOMY  2001   Social History   Social History Narrative   Lives at home with spouse.    Right handed.   Caffeine use: 2 cups coffee/day    8 oz soda/day    Immunization History  Administered Date(s) Administered   PFIZER(Purple Top)SARS-COV-2 Vaccination 04/10/2019, 05/05/2019, 10/19/2019, 06/20/2020   Pfizer Covid-19 Vaccine Bivalent Booster 61yrs & up 11/29/2020   Tdap 04/03/2018     Objective: Vital Signs: BP 120/74 (BP Location: Left Arm, Patient Position: Sitting, Cuff Size: Normal)   Pulse 61   Resp 14   Ht 5\' 4"  (1.626 m)   Wt 161 lb (73 kg)   BMI 27.64 kg/m    Physical Exam Cardiovascular:     Rate and Rhythm: Normal rate and regular rhythm.  Pulmonary:     Effort: Pulmonary effort is normal.     Breath sounds: Normal breath sounds.  Skin:    General: Skin is warm and dry.     Comments: Trace pedal edema bilateral Chronic hyperpigmentation and lipodermatosclerosis change in lower legs   Neurological:     Mental Status: She is alert.  Psychiatric:        Mood and Affect: Mood normal.      Musculoskeletal Exam:  Shoulders full ROM no tenderness or swelling Elbows full ROM no tenderness or swelling Wrists full ROM no tenderness or swelling Fingers full ROM no tenderness or swelling Left knee trace effusion, crepitus with ROM and with lateral or anterior pressure, tenderness  Investigation: No additional findings.  Imaging: No results found.  Recent  Labs:  Lab Results  Component Value Date   WBC 4.6 12/18/2022   HGB 11.9 12/18/2022   PLT 202 12/18/2022   NA 143 10/22/2022   K 4.2 10/22/2022   CL 96 10/22/2022   CO2 33 (H) 10/22/2022   GLUCOSE 103 (H) 10/22/2022   BUN 16 10/22/2022   CREATININE 0.92 10/22/2022   BILITOT 0.5 12/18/2022   ALKPHOS 84 08/20/2022   AST 20 12/18/2022   ALT 16 12/18/2022   PROT 6.5 12/18/2022   ALBUMIN 2.7 (L) 08/23/2022   CALCIUM 9.8 10/22/2022   GFRAA 65 02/04/2020   QFTBGOLDPLUS NEGATIVE 06/12/2022    Speciality Comments: No specialty comments available.  Procedures:  No procedures performed Allergies: Erythromycin   Assessment / Plan:     Visit Diagnoses: Rheumatoid arthritis, involving unspecified site, unspecified whether rheumatoid factor present (HCC) - Plan: Sedimentation rate, C-reactive protein  Pain mostly localized at the left knee currently.  No other joint synovitis appreciable.  Checking sed rate and CRP for inflammatory disease activity monitoring.  If severely elevated could treat with another course of oral steroids would avoid joint injection repeat at this time so soon after the last.  Plan to continue Enbrel 50 mg subcu weekly.  High risk medication use - Plan: CBC with Differential/Platelet, Hepatic function panel  Checking CBC and hepatic function panel for medication monitoring on continued long-term use of Enbrel.  She had recent basic metabolic panel checked that was normal.  No serious interval infections.  Primary osteoarthritis of left knee  We discussed at some length about surgery and perioperative risk.  I believe overall she would have a significant functional improvement with knee replacement surgery due to significant knee pain as well as instability with walking and also has pain even at rest.  Discussed perioperative drug management in her case would be straightforward just holding Enbrel treatment at least 1 week prior and at least 2 weeks following  surgery.  Orders: Orders Placed This Encounter  Procedures   Sedimentation rate   C-reactive protein   CBC with Differential/Platelet   Hepatic function panel   No orders of the defined types were placed in this encounter.    Follow-Up Instructions: Return in about 3 months (around 03/20/2023) for RA/OA on ENB f/u 3mos.   Fuller Plan, MD  Note - This record has been created using [employer].  Chart creation errors have been sought, but may not always  have been located. Such creation errors do not reflect on  the standard of medical care.

## 2022-12-19 ENCOUNTER — Ambulatory Visit: Payer: Medicare PPO

## 2022-12-19 DIAGNOSIS — R293 Abnormal posture: Secondary | ICD-10-CM

## 2022-12-19 DIAGNOSIS — R252 Cramp and spasm: Secondary | ICD-10-CM | POA: Diagnosis not present

## 2022-12-19 DIAGNOSIS — R262 Difficulty in walking, not elsewhere classified: Secondary | ICD-10-CM

## 2022-12-19 DIAGNOSIS — M6281 Muscle weakness (generalized): Secondary | ICD-10-CM

## 2022-12-19 DIAGNOSIS — M25562 Pain in left knee: Secondary | ICD-10-CM | POA: Diagnosis not present

## 2022-12-19 LAB — C-REACTIVE PROTEIN: CRP: 3.2 mg/L (ref <8.0)

## 2022-12-19 LAB — CBC WITH DIFFERENTIAL/PLATELET
Absolute Lymphocytes: 1495 {cells}/uL (ref 850–3900)
Absolute Monocytes: 345 {cells}/uL (ref 200–950)
Basophils Absolute: 9 {cells}/uL (ref 0–200)
Basophils Relative: 0.2 %
Eosinophils Absolute: 262 {cells}/uL (ref 15–500)
Eosinophils Relative: 5.7 %
HCT: 37.4 % (ref 35.0–45.0)
Hemoglobin: 11.9 g/dL (ref 11.7–15.5)
MCH: 27.6 pg (ref 27.0–33.0)
MCHC: 31.8 g/dL — ABNORMAL LOW (ref 32.0–36.0)
MCV: 86.8 fL (ref 80.0–100.0)
MPV: 9.3 fL (ref 7.5–12.5)
Monocytes Relative: 7.5 %
Neutro Abs: 2489 {cells}/uL (ref 1500–7800)
Neutrophils Relative %: 54.1 %
Platelets: 202 10*3/uL (ref 140–400)
RBC: 4.31 10*6/uL (ref 3.80–5.10)
RDW: 14.7 % (ref 11.0–15.0)
Total Lymphocyte: 32.5 %
WBC: 4.6 10*3/uL (ref 3.8–10.8)

## 2022-12-19 LAB — HEPATIC FUNCTION PANEL
AG Ratio: 1.6 (calc) (ref 1.0–2.5)
ALT: 16 U/L (ref 6–29)
AST: 20 U/L (ref 10–35)
Albumin: 4 g/dL (ref 3.6–5.1)
Alkaline phosphatase (APISO): 92 U/L (ref 37–153)
Bilirubin, Direct: 0.1 mg/dL (ref 0.0–0.2)
Globulin: 2.5 g/dL (ref 1.9–3.7)
Indirect Bilirubin: 0.4 mg/dL (ref 0.2–1.2)
Total Bilirubin: 0.5 mg/dL (ref 0.2–1.2)
Total Protein: 6.5 g/dL (ref 6.1–8.1)

## 2022-12-19 LAB — SEDIMENTATION RATE: Sed Rate: 9 mm/h (ref 0–30)

## 2022-12-19 NOTE — Therapy (Signed)
OUTPATIENT PHYSICAL THERAPY LOWER EXTREMITY TREATMENT   Patient Name: Meghan Schwartz MRN: [id number] DOB:November 08, 1950, 72 y.o., female Today's Date: 12/19/2022  END OF SESSION:  PT End of Session - 12/19/22 1545     Visit Number 8    Number of Visits 16    Date for PT Re-Evaluation 12/25/22    Authorization Type [employer] MEDICARE CHOICE PPO 16 visits 9/4-10/30    Authorization - Visit Number 8    Authorization - Number of Visits 16    Progress Note Due on Visit 10    PT Start Time 1539    PT Stop Time 1618    PT Time Calculation (min) 39 min    Activity Tolerance Patient tolerated treatment well    Behavior During Therapy WFL for tasks assessed/performed              Past Medical History:  Diagnosis Date   Allergic rhinitis    Anxiety    Benzodiazepine dependence (HCC)    Bruxism    CAD (coronary artery disease)    S/p NSTEMI 01/2018 >> LHC: oLAD 20, pLAD 20; oD1 20, oLCx 40, pRCA 99 >> PCI: DES to prox RCA // Echo 01/2018: EF 50-55; prob inf-lat and inf HK   Chronic diastolic heart failure    Echocardiogram 07/2018: EF 55-60, grade 1 diastolic dysfunction, normal wall motion, normal GLS (-22.3), PASP 28   Chronic foot pain    bunions, torn ligaments-on chronic pain medication   CTS (carpal tunnel syndrome) 06/08/2014   Depression    High cholesterol    Hypertension    Low blood potassium    MDD (major depressive disorder) 10/02/2017   Migraine    Myocardial infarction (HCC) 02/01/2018   Pneumonia 1986   left lung   RA (rheumatoid arthritis) (HCC)    Past Surgical History:  Procedure Laterality Date   BTL  2000   CARPAL TUNNEL RELEASE Left    CESAREAN SECTION  1978. 1983   CORONARY STENT INTERVENTION N/A 01/27/2018   Procedure: CORONARY STENT INTERVENTION;  Surgeon: Kathleene Hazel, MD;  [facility]: [facility];  Service: Cardiovascular;  Laterality: N/A;   eye implants     FOOT SURGERY Left 2008   front teeth removed after injury     KNEE  ARTHROSCOPY WITH LATERAL MENISECTOMY Left 02/02/2019   Procedure: LEFT KNEE ARTHROSCOPY, MEDIAL AND LATERAL MENISECTOMY, EXCISION OF LEFT LATERAL MENISCAL CYST;  Surgeon: Valeria Batman, MD;  Location: [facility];  Service: Orthopedics;  Laterality: Left;   LEFT HEART CATH AND CORONARY ANGIOGRAPHY N/A 01/27/2018   Procedure: LEFT HEART CATH AND CORONARY ANGIOGRAPHY;  Surgeon: Kathleene Hazel, MD;  Location: [facility];  Service: Cardiovascular;  Laterality: N/A;   pre cancerous lesion removed from forehead     TOTAL ABDOMINAL HYSTERECTOMY W/ BILATERAL SALPINGOOPHORECTOMY  2002   Dr. Jackelyn Knife   VAGINAL HYSTERECTOMY  2001   Patient Active Problem List   Diagnosis Date Noted   Hypocalcemia 08/21/2022   Falls, initial encounter 08/21/2022   Prolonged QT interval 08/21/2022   Acute metabolic encephalopathy 08/21/2022   Essential hypertension 08/21/2022   AKI (acute kidney injury) (HCC) 08/21/2022   Chronic pain 08/21/2022   Chronic diastolic CHF (congestive heart failure) (HCC) 08/21/2022   Effusion, left knee 06/12/2022   Leg edema, left 12/25/2021   Claw toe, left 08/07/2021   Dog bite 03/05/2021   High risk medication use 02/05/2021   Dry mouth 02/05/2021   Pain in  left foot 01/31/2021   Bunion of great toe of right foot 06/15/2020   Hypoxia 03/05/2019   Derangement of posterior horn of medial meniscus 02/02/2019   Derangement of posterior horn of lateral meniscus 02/02/2019   Meniscal cyst, unspecified laterality 12/01/2018   Torn medial meniscus 12/01/2018   Primary osteoarthritis of left knee 12/01/2018   Coronary artery disease involving native coronary artery of native heart without angina pectoris 09/14/2018   (HFpEF) heart failure with preserved ejection fraction (HCC)    Chronic pain of left knee 05/15/2018   Mass of left knee 05/15/2018   Non-ST elevation (NSTEMI) myocardial infarction [facility])    Chest pain 01/25/2018   RA (rheumatoid arthritis) (HCC)  01/25/2018   Hyperlipidemia 01/25/2018   Hypokalemia 01/25/2018   Depression with anxiety 01/25/2018   Tobacco abuse 01/25/2018   Lower extremity cellulitis 01/25/2018   Benzodiazepine dependence (HCC)    MDD (major depressive disorder), recurrent severe, without psychosis (HCC) 10/02/2017   CTS (carpal tunnel syndrome) 06/08/2014    PCP: Camie Patience, FNP   REFERRING PROVIDER: Dr. Sheliah Hatch  REFERRING DIAG: Left knee osteoarthritis   THERAPY DIAG:  Acute pain of left knee  Difficulty in walking, not elsewhere classified  Muscle weakness (generalized)  Cramp and spasm  Abnormal posture  Rationale for Evaluation and Treatment: Rehabilitation  ONSET DATE: 09/26/22  SUBJECTIVE:   SUBJECTIVE STATEMENT: Patient 9 min late for appt.  "My knee is aching a little more today"    PERTINENT HISTORY: RA PAIN:  12/19/22 Are you having pain? Yes: NPRS scale: 1-4/10 Pain location: left knee Pain description: sharp, aching Aggravating factors: walking Relieving factors: rest, Tylenol, has bed that she can elevate her legs  PRECAUTIONS: Other: RA, Fall  RED FLAGS: None   WEIGHT BEARING RESTRICTIONS: No  FALLS:  Has patient fallen in last 6 months? Yes. Number of falls 20 per patient   She explains that she was having a neurological decline due to Potassium was at 2 when she got to hospital  LIVING ENVIRONMENT: Lives with: lives with their spouse Lives in: House/apartment Stairs: Yes: Internal: 15 steps; on right going up and External: 4 steps; on right going up Has following equipment at home: Single point cane  OCCUPATION: retired  PLOF: Independent, Independent with basic ADLs, Independent with household mobility with device, Independent with community mobility with device, Independent with transfers, Requires assistive device for independence, Needs assistance with ADLs, Needs assistance with homemaking, and Needs assistance with gait  PATIENT GOALS: to  avoid knee replacement  NEXT MD VISIT: prn  OBJECTIVE:    PATIENT SURVEYS:  FOTO 24, predicted 48  COGNITION: Overall cognitive status: Within functional limits for tasks assessed     SENSATION: WFL  EDEMA:  Circumferential: 40.8 cm   POSTURE:  Severe left knee valgus deformity   LOWER EXTREMITY ROM:  WNL  LOWER EXTREMITY MMT:  Generally 3+/5 on knee flexion and extension,  all others generally 4/5  FUNCTIONAL TESTS:  5 times sit to stand: 32.64 sec Timed up and go (TUG): 30.24 sec  GAIT: Distance walked: 30 feet Assistive device utilized: Single point cane Level of assistance: SBA Comments: antalgic, guarded, severe left knee valgus deformity   TODAY'S TREATMENT:       DATE: 12/19/22  Nustep x 5 min  level 1 green machine (PT present to discuss progress and status) Seated clam x 20 with green loop Seated ball squeeze x 20 Seated ball squeeze with long arc quad with 2  lbs March with 2 lbs x 20  Seated hip ER 2 x 10 each LE with 2#  Seated hamstring curl 2 x 10 with red band Seated LAQ with 2 lbs with isometric hip adduction 2 x 10  DATE: 12/17/22  Nustep x 5 min  level 1 green machine (PT present to discuss progress and status) Seated clam x 20 with green loop Seated ball squeeze x 20 Seated ball squeeze with long arc quad with 2 lbs March with 2 lbs x 20  Seated hip ER 2 x 10 each LE with 2#  Seated hamstring curl 2 x 10 with red band Gait training with rollator walker encouraging increased step length and upright posture Lengthy conversation about the possible knee replacement and whether she should continue PT or hold until after surgery.   DATE: 12/10/22  Nustep x 5 min  level 1 blue machine (PT present to discuss progress and status) Seated toe and heel raises x 20 each Seated red band clams 2 x 10 Seated ball squeezes (purple ball) x 20  Seated left hip flexion up and over hurdle on floor 10x each LE Seated LAQ x 0# left 2 sets of 10   Seated hamstring curl 2 x 10 with red band Seated hip ER 2 x 10 each LE with 0#     Standing hip ER/abd 2 x 10 with green loop                                                      DATE: 10/30/22 Initial eval completed and initiated HEP    PATIENT EDUCATION:  Education details: Initiated HEP, lengthy discussion about fall risk and need for walker,  suggested rollator walker and provided handouts for exercises and photo of appropriate walker.  Person educated: Patient Education method: Explanation, Demonstration, Verbal cues, and Handouts Education comprehension: verbalized understanding, returned demonstration, and verbal cues required  HOME EXERCISE PROGRAM: Access Code: [facility] URL: [email or web address] Date: 10/30/2022 Prepared by: Mikey Kirschner  Exercises - Supine Quadricep Sets  - 1 x daily - 7 x weekly - 2 sets - 10 reps - Supine Straight Leg Raises  - 1 x daily - 7 x weekly - 2 sets - 10 reps - Seated Long Arc Quad  - 1 x daily - 7 x weekly - 2 sets - 10 reps  ASSESSMENT:  CLINICAL IMPRESSION: Christinea is progressing appropriately.  She is able to tolerate slow progression of reps and resistance.   She would also benefit from ongoing PT for 1 time per week or 1 time every other week up until surgery to prepare her for the surgery.    OBJECTIVE IMPAIRMENTS: Abnormal gait, decreased balance, decreased knowledge of condition, difficulty walking, decreased ROM, decreased strength, increased edema, increased fascial restrictions, increased muscle spasms, impaired flexibility, and pain.   ACTIVITY LIMITATIONS: carrying, lifting, bending, sitting, standing, squatting, sleeping, stairs, transfers, bed mobility, bathing, and dressing  PARTICIPATION LIMITATIONS: meal prep, cleaning, laundry, driving, shopping, community activity, yard work, and church  PERSONAL FACTORS: Fitness and 1-2 comorbidities: RA, CAD  are also affecting patient's functional outcome.   REHAB  POTENTIAL: Good  CLINICAL DECISION MAKING: Stable/uncomplicated  EVALUATION COMPLEXITY: Low   GOALS: Goals reviewed with patient? Yes  SHORT TERM GOALS: Target date: 11/27/2022  Pain report to be no greater than 4/10  Baseline: Goal status: MET 12/17/22  2.  Patient will be independent with initial HEP  Baseline:  Goal status: MET 12/17/22  3.  Patient to demonstrate proper heel to toe progression on left LE Baseline:  Goal status: MET 12/17/22 with rollator walker  LONG TERM GOALS: Target date: 12/25/2022   Patient to report pain no greater than 2/10  Baseline:  Goal status: INITIAL  2.  Patient to be independent with advanced HEP  Baseline:  Goal status: INITIAL  3.  Patient to be able to stand or walk for at least 15 min without leg pain  Baseline:  Goal status: INITIAL  4.  Functional scores to improve by 2-3 seconds Baseline:  Goal status: INITIAL  5.  FOTO to be Baseline:  Goal status: INITIAL  6.  Patient to report 85% improvement in overall symptoms Baseline:  Goal status: INITIAL   PLAN:  PT FREQUENCY: 1-2x/week  PT DURATION: 8 weeks  PLANNED INTERVENTIONS: Therapeutic exercises, Therapeutic activity, Neuromuscular re-education, Balance training, Gait training, Patient/Family education, Self Care, Joint mobilization, Stair training, DME instructions, Aquatic Therapy, Dry Needling, Electrical stimulation, Cryotherapy, Moist heat, Compression bandaging, Splintting, Taping, Vasopneumatic device, Ultrasound, Ionotophoresis 4mg /ml Dexamethasone, Manual therapy, and Re-evaluation  PLAN FOR NEXT SESSION:  Continue Nustep, prepare for upcoming surgery.    Victorino Dike B. Latravia [place]gate, PT 12/19/22 10:29 PM [facility] [address], [address] [place], [place] [address] Phone # [phone] Fax [phone]

## 2022-12-24 ENCOUNTER — Ambulatory Visit: Payer: Medicare PPO

## 2022-12-24 DIAGNOSIS — R293 Abnormal posture: Secondary | ICD-10-CM | POA: Diagnosis not present

## 2022-12-24 DIAGNOSIS — R262 Difficulty in walking, not elsewhere classified: Secondary | ICD-10-CM | POA: Diagnosis not present

## 2022-12-24 DIAGNOSIS — M6281 Muscle weakness (generalized): Secondary | ICD-10-CM | POA: Diagnosis not present

## 2022-12-24 DIAGNOSIS — R252 Cramp and spasm: Secondary | ICD-10-CM | POA: Diagnosis not present

## 2022-12-24 DIAGNOSIS — M25562 Pain in left knee: Secondary | ICD-10-CM

## 2022-12-24 NOTE — Therapy (Signed)
OUTPATIENT PHYSICAL THERAPY LOWER EXTREMITY TREATMENT  Progress Note Reporting Period 10/30/22 to 12/24/22  See note below for Objective Data and Assessment of Progress/Goals.      Patient Name: Meghan Schwartz MRN: [id number] DOB:17-Apr-1950, 72 y.o., female Today's Date: 12/24/2022  END OF SESSION:  PT End of Session - 12/24/22 1717     Visit Number 9    Number of Visits 16    Date for PT Re-Evaluation 12/25/22    Authorization Type [employer] MEDIC[employer] CHOICE PPO 16 visits 9/4-10/30    Authorization Time Period On 12/24/22 put in request for date extension (1 time per week after visit on 12/25/27)    Authorization - Visit Number 9    Authorization - Number of Visits 16    Progress Note Due on Visit 20    PT Start Time 1545    PT Stop Time 1615    PT Time Calculation (min) 30 min    Activity Tolerance Patient tolerated treatment well    Behavior During Therapy [facility] for tasks assessed/performed               Past Medical History:  Diagnosis Date   Allergic rhinitis    Anxiety    Benzodiazepine dependence (HCC)    Bruxism    CAD (coronary artery disease)    S/p NSTEMI 01/2018 >> LHC: oLAD 20, pLAD 20; oD1 20, oLCx 40, pRCA 99 >> PCI: DES to prox RCA // Echo 01/2018: EF 50-55; prob inf-lat and inf HK   Chronic diastolic heart failure    Echocardiogram 07/2018: EF 55-60, grade 1 diastolic dysfunction, normal wall motion, normal GLS (-22.3), PASP 28   Chronic foot pain    bunions, torn ligaments-on chronic pain medication   CTS (carpal tunnel syndrome) 06/08/2014   Depression    High cholesterol    Hypertension    Low blood potassium    MDD (major depressive disorder) 10/02/2017   Migraine    Myocardial infarction (HCC) 02/01/2018   Pneumonia 1986   left lung   RA (rheumatoid arthritis) (HCC)    Past Surgical History:  Procedure Laterality Date   BTL  2000   CARPAL TUNNEL RELEASE Left    CESAREAN SECTION  1978. 1983   CORONARY STENT INTERVENTION N/A 01/27/2018    Procedure: CORONARY STENT INTERVENTION;  Surgeon: Kathleene Hazel, MD;  Location: [facility];  Service: Cardiovascular;  Laterality: N/A;   eye implants     FOOT SURGERY Left 2008   front teeth removed after injury     KNEE ARTHROSCOPY WITH LATERAL MENISECTOMY Left 02/02/2019   Procedure: LEFT KNEE ARTHROSCOPY, MEDIAL AND LATERAL MENISECTOMY, EXCISION OF LEFT LATERAL MENISCAL CYST;  Surgeon: Valeria Batman, MD;  Location: [facility];  Service: Orthopedics;  Laterality: Left;   LEFT HEART CATH AND CORONARY ANGIOGRAPHY N/A 01/27/2018   Procedure: LEFT HEART CATH AND CORONARY ANGIOGRAPHY;  Surgeon: Kathleene Hazel, MD;  Location: [facility];  Service: Cardiovascular;  Laterality: N/A;   pre cancerous lesion removed from forehead     TOTAL ABDOMINAL HYSTERECTOMY W/ BILATERAL SALPINGOOPHORECTOMY  2002   Dr. Jackelyn Knife   VAGINAL HYSTERECTOMY  2001   Patient Active Problem List   Diagnosis Date Noted   Hypocalcemia 08/21/2022   Falls, initial encounter 08/21/2022   Prolonged QT interval 08/21/2022   Acute metabolic encephalopathy 08/21/2022   Essential hypertension 08/21/2022   AKI (acute kidney injury) (HCC) 08/21/2022   Chronic pain 08/21/2022   Chronic diastolic  CHF (congestive heart failure) (HCC) 08/21/2022   Effusion, left knee 06/12/2022   Leg edema, left 12/25/2021   Claw toe, left 08/07/2021   Dog bite 03/05/2021   High risk medication use 02/05/2021   Dry mouth 02/05/2021   Pain in left foot 01/31/2021   Bunion of great toe of right foot 06/15/2020   Hypoxia 03/05/2019   Derangement of posterior horn of medial meniscus 02/02/2019   Derangement of posterior horn of lateral meniscus 02/02/2019   Meniscal cyst, unspecified laterality 12/01/2018   Torn medial meniscus 12/01/2018   Primary osteoarthritis of left knee 12/01/2018   Coronary artery disease involving native coronary artery of native heart without angina pectoris 09/14/2018   (HFpEF) heart  failure with preserved ejection fraction (HCC)    Chronic pain of left knee 05/15/2018   Mass of left knee 05/15/2018   Non-ST elevation (NSTEMI) myocardial infarction [facility])    Chest pain 01/25/2018   RA (rheumatoid arthritis) (HCC) 01/25/2018   Hyperlipidemia 01/25/2018   Hypokalemia 01/25/2018   Depression with anxiety 01/25/2018   Tobacco abuse 01/25/2018   Lower extremity cellulitis 01/25/2018   Benzodiazepine dependence (HCC)    MDD (major depressive disorder), recurrent severe, without psychosis (HCC) 10/02/2017   CTS (carpal tunnel syndrome) 06/08/2014    PCP: Camie Patience, FNP   REFERRING PROVIDER: Dr. Sheliah Hatch  REFERRING DIAG: Left knee osteoarthritis   THERAPY DIAG:  Acute pain of left knee  Difficulty in walking, not elsewhere classified  Muscle weakness (generalized)  Cramp and spasm  Abnormal posture  Rationale for Evaluation and Treatment: Rehabilitation  ONSET DATE: 09/26/22  SUBJECTIVE:   SUBJECTIVE STATEMENT: Patient reports "I feel like my knee is starting to angle more"    PERTINENT HISTORY: RA PAIN:  12/24/22 Are you having pain? Yes: NPRS scale: 3-4/10 Pain location: left knee Pain description: sharp, aching Aggravating factors: walking Relieving factors: rest, Tylenol, has bed that she can elevate her legs  PRECAUTIONS: Other: RA, Fall  RED FLAGS: None   WEIGHT BEARING RESTRICTIONS: No  FALLS:  Has patient fallen in last 6 months? Yes. Number of falls 20 per patient   She explains that she was having a neurological decline due to Potassium was at 2 when she got to hospital  LIVING ENVIRONMENT: Lives with: lives with their spouse Lives in: House/apartment Stairs: Yes: Internal: 15 steps; on right going up and External: 4 steps; on right going up Has following equipment at home: Single point cane  OCCUPATION: retired  PLOF: Independent, Independent with basic ADLs, Independent with household mobility with device,  Independent with community mobility with device, Independent with transfers, Requires assistive device for independence, Needs assistance with ADLs, Needs assistance with homemaking, and Needs assistance with gait  PATIENT GOALS: to avoid knee replacement  NEXT MD VISIT: prn  OBJECTIVE:    PATIENT SURVEYS:  FOTO 24, predicted 48  12/24/22: 31  COGNITION: Overall cognitive status: Within functional limits for tasks assessed     SENSATION: WFL  EDEMA:  Circumferential: 40.8 cm   POSTURE:  Severe left knee valgus deformity   LOWER EXTREMITY ROM:  WNL  LOWER EXTREMITY MMT:  Generally 3+/5 on knee flexion and extension,  all others generally 4/5  12/24/22: Generally 4-/5 left knee flexion and extension, all others 4/5  FUNCTIONAL TESTS:  5 times sit to stand: 32.64 sec Timed up and go (TUG): 30.24 sec  Re-test next visit:  GAIT: Distance walked: 30 feet Assistive device utilized: Single point cane Level  of assistance: SBA Comments: antalgic, guarded, severe left knee valgus deformity   TODAY'S TREATMENT:       DATE: 12/24/22  Nustep x 5 min  level 1 green machine (PT present to discuss progress and status) March with 2.5 lbs x 20  Seated LAQ with 2.5 lbs with  2 x 10 Seated hip ER 2 x 10 each LE with 2# Seated ball squeeze x 20 Seated ball squeeze with long arc quad with 2.5 lbs Seated clam x 20 with blue loop  Seated hamstring curl 2 x 10 with red band Standing lateral band walks with blue loop x 3 laps at barre Progress note completed  DATE: 12/19/22  Nustep x 5 min  level 1 green machine (PT present to discuss progress and status) Seated clam x 20 with green loop Seated ball squeeze x 20 Seated ball squeeze with long arc quad with 2 lbs March with 2 lbs x 20  Seated hip ER 2 x 10 each LE with 2#  Seated hamstring curl 2 x 10 with red band Seated LAQ with 2 lbs with isometric hip adduction 2 x 10  DATE: 12/17/22  Nustep x 5 min  level 1 green  machine (PT present to discuss progress and status) Seated clam x 20 with green loop Seated ball squeeze x 20 Seated ball squeeze with long arc quad with 2 lbs March with 2 lbs x 20  Seated hip ER 2 x 10 each LE with 2#  Seated hamstring curl 2 x 10 with red band Gait training with rollator walker encouraging increased step length and upright posture Lengthy conversation about the possible knee replacement and whether she should continue PT or hold until after surgery.   DATE: 10/30/22 Initial eval completed and initiated HEP    PATIENT EDUCATION:  Education details: Initiated HEP, lengthy discussion about fall risk and need for walker,  suggested rollator walker and provided handouts for exercises and photo of appropriate walker.  Person educated: Patient Education method: Explanation, Demonstration, Verbal cues, and Handouts Education comprehension: verbalized understanding, returned demonstration, and verbal cues required  HOME EXERCISE PROGRAM: Access Code: [facility] URL: [email or web address] Date: 10/30/2022 Prepared by: Mikey Kirschner  Exercises - Supine Quadricep Sets  - 1 x daily - 7 x weekly - 2 sets - 10 reps - Supine Straight Leg Raises  - 1 x daily - 7 x weekly - 2 sets - 10 reps - Seated Long Arc Quad  - 1 x daily - 7 x weekly - 2 sets - 10 reps  ASSESSMENT:  CLINICAL IMPRESSION: Meghan Schwartz is making good functional progress.  She admits that she is able to get out and get to her appts but handling the new walker is a bit cumbersome.  Her objective findings are slightly improved.  She met with Dr. Lequita Halt to see if she is a candidate for TKA.  Dr.  Lequita Halt agreed to do the surgery if her cardiologist clears her.  She called to make appt with cardiologist to schedule appt but no appt available until February 2025.  She has requested earliest appt available if there is a cancellation.  She is demonstrating improved motivation now that she knows she may be a  candidate for the knee replacement.  She is able to tolerate slow progression of reps and resistance.   She would also benefit from ongoing PT for 1 time per week up until surgery to prepare her for the surgery.    OBJECTIVE IMPAIRMENTS: Abnormal gait, decreased  balance, decreased knowledge of condition, difficulty walking, decreased ROM, decreased strength, increased edema, increased fascial restrictions, increased muscle spasms, impaired flexibility, and pain.   ACTIVITY LIMITATIONS: carrying, lifting, bending, sitting, standing, squatting, sleeping, stairs, transfers, bed mobility, bathing, and dressing  PARTICIPATION LIMITATIONS: meal prep, cleaning, laundry, driving, shopping, community activity, yard work, and church  PERSONAL FACTORS: Fitness and 1-2 comorbidities: RA, CAD  are also affecting patient's functional outcome.   REHAB POTENTIAL: Good  CLINICAL DECISION MAKING: Stable/uncomplicated  EVALUATION COMPLEXITY: Low   GOALS: Goals reviewed with patient? Yes  SHORT TERM GOALS: Target date: 11/27/2022  Pain report to be no greater than 4/10  Baseline: Goal status: MET 12/17/22  2.  Patient will be independent with initial HEP  Baseline:  Goal status: MET 12/17/22  3.  Patient to demonstrate proper heel to toe progression on left LE Baseline:  Goal status: MET 12/17/22 with rollator walker  LONG TERM GOALS: Target date: 02/07/23   Patient to report pain no greater than 2/10  Baseline:  Goal status: In progress  2.  Patient to be independent with advanced HEP  Baseline:  Goal status: In progress  3.  Patient to be able to stand or walk for at least 15 min without leg pain  Baseline:  Goal status: INITIAL  4.  Functional scores to improve by 2-3 seconds Baseline:  Goal status: INITIAL  5.  FOTO to be 48 Baseline: 31 on 12/24/22 Goal status: In progress  6.  Patient to report 85% improvement in overall symptoms Baseline:  Goal status: In progress     PLAN:  PT FREQUENCY: 1-2x/week  PT DURATION: 8 weeks  PLANNED INTERVENTIONS: Therapeutic exercises, Therapeutic activity, Neuromuscular re-education, Balance training, Gait training, Patient/Family education, Self Care, Joint mobilization, Stair training, DME instructions, Aquatic Therapy, Dry Needling, Electrical stimulation, Cryotherapy, Moist heat, Compression bandaging, Splintting, Taping, Vasopneumatic device, Ultrasound, Ionotophoresis 4mg /ml Dexamethasone, Manual therapy, and Re-evaluation  PLAN FOR NEXT SESSION:  Continue Nustep, quad strengthening, hip strengthening, alignment training, proper footwear, gait training, stair training, prepare for upcoming surgery.    Victorino Dike B. Youssouf Shipley, PT 12/24/22 5:43 PM [facility] [address], [address] [place], [place] [address] Phone # [phone] Fax [phone]

## 2022-12-25 ENCOUNTER — Ambulatory Visit: Payer: Medicare PPO

## 2022-12-25 NOTE — Telephone Encounter (Signed)
Opened in error

## 2022-12-31 DIAGNOSIS — H26492 Other secondary cataract, left eye: Secondary | ICD-10-CM | POA: Diagnosis not present

## 2022-12-31 DIAGNOSIS — H43813 Vitreous degeneration, bilateral: Secondary | ICD-10-CM | POA: Diagnosis not present

## 2022-12-31 DIAGNOSIS — Z961 Presence of intraocular lens: Secondary | ICD-10-CM | POA: Diagnosis not present

## 2022-12-31 DIAGNOSIS — H353231 Exudative age-related macular degeneration, bilateral, with active choroidal neovascularization: Secondary | ICD-10-CM | POA: Diagnosis not present

## 2023-01-02 ENCOUNTER — Ambulatory Visit: Payer: Medicare PPO | Attending: Family Medicine

## 2023-01-02 ENCOUNTER — Ambulatory Visit: Payer: Medicare PPO

## 2023-01-02 DIAGNOSIS — R293 Abnormal posture: Secondary | ICD-10-CM | POA: Diagnosis not present

## 2023-01-02 DIAGNOSIS — M25562 Pain in left knee: Secondary | ICD-10-CM

## 2023-01-02 DIAGNOSIS — R252 Cramp and spasm: Secondary | ICD-10-CM

## 2023-01-02 DIAGNOSIS — M6281 Muscle weakness (generalized): Secondary | ICD-10-CM | POA: Diagnosis not present

## 2023-01-02 DIAGNOSIS — R262 Difficulty in walking, not elsewhere classified: Secondary | ICD-10-CM

## 2023-01-02 NOTE — Therapy (Addendum)
 OUTPATIENT PHYSICAL THERAPY LOWER EXTREMITY TREATMENT  PHYSICAL THERAPY DISCHARGE SUMMARY  Visits from Start of Care: 10  Current functional level related to goals / functional outcomes: See below   Remaining deficits: See below   Education / Equipment: See below   Patient agrees to discharge. Patient goals were partially met. Patient is being discharged due to not returning since the last visit.   Progress Note Reporting Period 10/30/22 to 12/24/22  See note below for Objective Data and Assessment of Progress/Goals.      Patient Name: Meghan Schwartz MRN: [id number] DOB:September 17, 1950, 72 y.o., female Today's Date: 01/02/2023  END OF SESSION:  PT End of Session - 01/02/23 1459     Visit Number 10    Number of Visits 16    Date for PT Re-Evaluation 12/25/22    Authorization Type [employer]    Authorization Time Period Cohere Approved 7 visits-12/25/2022-02/07/2023    Authorization - Visit Number 10    Authorization - Number of Visits 16    Progress Note Due on Visit 20    PT Start Time 1445    PT Stop Time 1530    PT Time Calculation (min) 45 min    Activity Tolerance Patient tolerated treatment well    Behavior During Therapy WFL for tasks assessed/performed               Past Medical History:  Diagnosis Date   Allergic rhinitis    Anxiety    Benzodiazepine dependence (HCC)    Bruxism    CAD (coronary artery disease)    S/p NSTEMI 01/2018 >> LHC: oLAD 20, pLAD 20; oD1 20, oLCx 40, pRCA 99 >> PCI: DES to prox RCA // Echo 01/2018: EF 50-55; prob inf-lat and inf HK   Chronic diastolic heart failure    Echocardiogram 07/2018: EF 55-60, grade 1 diastolic dysfunction, normal wall motion, normal GLS (-22.3), PASP 28   Chronic foot pain    bunions, torn ligaments-on chronic pain medication   CTS (carpal tunnel syndrome) 06/08/2014   Depression    High cholesterol    Hypertension    Low blood potassium    MDD (major depressive disorder) 10/02/2017   Migraine     Myocardial infarction (HCC) 02/01/2018   Pneumonia 1986   left lung   RA (rheumatoid arthritis) (HCC)    Past Surgical History:  Procedure Laterality Date   BTL  2000   CARPAL TUNNEL RELEASE Left    CESAREAN SECTION  1978. 1983   CORONARY STENT INTERVENTION N/A 01/27/2018   Procedure: CORONARY STENT INTERVENTION;  Surgeon: Verlin Lonni BIRCH, MD;  [facility];  Service: Cardiovascular;  Laterality: N/A;   eye implants     FOOT SURGERY Left 2008   front teeth removed after injury     KNEE ARTHROSCOPY WITH LATERAL MENISECTOMY Left 02/02/2019   Procedure: LEFT KNEE ARTHROSCOPY, MEDIAL AND LATERAL MENISECTOMY, EXCISION OF LEFT LATERAL MENISCAL CYST;  Surgeon: Anderson Maude ORN, MD;  Location: [facility];  Service: Orthopedics;  Laterality: Left;   LEFT HEART CATH AND CORONARY ANGIOGRAPHY N/A 01/27/2018   Procedure: LEFT HEART CATH AND CORONARY ANGIOGRAPHY;  Surgeon: Verlin Lonni BIRCH, MD;  [facility];  Service: Cardiovascular;  Laterality: N/A;   pre cancerous lesion removed from forehead     TOTAL ABDOMINAL HYSTERECTOMY W/ BILATERAL SALPINGOOPHORECTOMY  2002   Dr. Horacio   VAGINAL HYSTERECTOMY  2001   Patient Active Problem List   Diagnosis Date Noted  Hypocalcemia 08/21/2022   Falls, initial encounter 08/21/2022   Prolonged QT interval 08/21/2022   Acute metabolic encephalopathy 08/21/2022   Essential hypertension 08/21/2022   AKI (acute kidney injury) (HCC) 08/21/2022   Chronic pain 08/21/2022   Chronic diastolic CHF (congestive heart failure) (HCC) 08/21/2022   Effusion, left knee 06/12/2022   Leg edema, left 12/25/2021   Claw toe, left 08/07/2021   Dog bite 03/05/2021   High risk medication use 02/05/2021   Dry mouth 02/05/2021   Pain in left foot 01/31/2021   Bunion of great toe of right foot 06/15/2020   Hypoxia 03/05/2019   Derangement of posterior horn of medial meniscus 02/02/2019   Derangement of posterior horn of  lateral meniscus 02/02/2019   Meniscal cyst, unspecified laterality 12/01/2018   Torn medial meniscus 12/01/2018   Primary osteoarthritis of left knee 12/01/2018   Coronary artery disease involving native coronary artery of native heart without angina pectoris 09/14/2018   (HFpEF) heart failure with preserved ejection fraction (HCC)    Chronic pain of left knee 05/15/2018   Mass of left knee 05/15/2018   Non-ST elevation (NSTEMI) myocardial infarction [facility])    Chest pain 01/25/2018   RA (rheumatoid arthritis) (HCC) 01/25/2018   Hyperlipidemia 01/25/2018   Hypokalemia 01/25/2018   Depression with anxiety 01/25/2018   Tobacco abuse 01/25/2018   Lower extremity cellulitis 01/25/2018   Benzodiazepine dependence (HCC)    MDD (major depressive disorder), recurrent severe, without psychosis (HCC) 10/02/2017   CTS (carpal tunnel syndrome) 06/08/2014    PCP: Dyane Anthony RAMAN, FNP   REFERRING PROVIDER: Dr. Lonni Ester  REFERRING DIAG: Left knee osteoarthritis   THERAPY DIAG:  Acute pain of left knee  Difficulty in walking, not elsewhere classified  Muscle weakness (generalized)  Cramp and spasm  Abnormal posture  Rationale for Evaluation and Treatment: Rehabilitation  ONSET DATE: 09/26/22  SUBJECTIVE:   SUBJECTIVE STATEMENT: Patient reports she is not hurting any worse since her fall.     PERTINENT HISTORY: RA PAIN:  12/24/22 Are you having pain? Yes: NPRS scale: 3-4/10 Pain location: left knee Pain description: sharp, aching Aggravating factors: walking Relieving factors: rest, Tylenol , has bed that she can elevate her legs  PRECAUTIONS: Other: RA, Fall  RED FLAGS: None   WEIGHT BEARING RESTRICTIONS: No  FALLS:  Has patient fallen in last 6 months? Yes. Number of falls 20 per patient  She explains that she was having a neurological decline due to Potassium was at 2 when she got to hospital  LIVING ENVIRONMENT: Lives with: lives with their spouse Lives  in: House/apartment Stairs: Yes: Internal: 15 steps; on right going up and External: 4 steps; on right going up Has following equipment at home: Single point cane  OCCUPATION: retired  PLOF: Independent, Independent with basic ADLs, Independent with household mobility with device, Independent with community mobility with device, Independent with transfers, Requires assistive device for independence, Needs assistance with ADLs, Needs assistance with homemaking, and Needs assistance with gait  PATIENT GOALS: to avoid knee replacement  NEXT MD VISIT: prn  OBJECTIVE:    PATIENT SURVEYS:  FOTO 24, predicted 48  12/24/22: 31  COGNITION: Overall cognitive status: Within functional limits for tasks assessed     SENSATION: WFL  EDEMA:  Circumferential: 40.8 cm   POSTURE: Severe left knee valgus deformity   LOWER EXTREMITY ROM:  WNL  LOWER EXTREMITY MMT:  Generally 3+/5 on knee flexion and extension,  all others generally 4/5  12/24/22: Generally 4-/5 left knee flexion  and extension, all others 4/5  FUNCTIONAL TESTS:  5 times sit to stand: 32.64 sec Timed up and go (TUG): 30.24 sec  Re-test next visit:  GAIT: Distance walked: 30 feet Assistive device utilized: Single point cane Level of assistance: SBA Comments: antalgic, guarded, severe left knee valgus deformity   TODAY'S TREATMENT:       DATE: 01/02/23  Nustep x 5 min  level 1 green machine (PT present to discuss progress and status) Sit to stand from chair x 5 Seated clam x 20 with red loop Sit to stand with red loop for encouraging alignment x 5 March with 2.5 lbs x 20  Seated LAQ with 2.5 lbs with  2 x 10 Seated hip ER 2 x 10 each LE with 2.5lbs Seated ball squeeze x 20 Seated ball squeeze with long arc quad with 2.5 lbs Seated clam x 20 with blue loop  Seated hamstring curl 2 x 10 with red band Standing lateral band walks with red loop x 2 laps at barre Seated piriformis stretch 3 x 30 sec each  LE  DATE: 12/24/22  Nustep x 5 min  level 1 green machine (PT present to discuss progress and status) March with 2.5 lbs x 20  Seated LAQ with 2.5 lbs with  2 x 10 Seated hip ER 2 x 10 each LE with 2# Seated ball squeeze x 20 Seated ball squeeze with long arc quad with 2.5 lbs Seated clam x 20 with blue loop  Seated hamstring curl 2 x 10 with red band Standing lateral band walks with blue loop x 3 laps at barre Progress note completed  DATE: 12/19/22  Nustep x 5 min  level 1 green machine (PT present to discuss progress and status) Seated clam x 20 with green loop Seated ball squeeze x 20 Seated ball squeeze with long arc quad with 2 lbs March with 2 lbs x 20  Seated hip ER 2 x 10 each LE with 2#  Seated hamstring curl 2 x 10 with red band Seated LAQ with 2 lbs with isometric hip adduction 2 x 10   DATE: 10/30/22 Initial eval completed and initiated HEP    PATIENT EDUCATION:  Education details: Initiated HEP, lengthy discussion about fall risk and need for walker,  suggested rollator walker and provided handouts for exercises and photo of appropriate walker.  Person educated: Patient Education method: Explanation, Demonstration, Verbal cues, and Handouts Education comprehension: verbalized understanding, returned demonstration, and verbal cues required  HOME EXERCISE PROGRAM: Access Code: [facility] URL: [email or web address] Date: 10/30/2022 Prepared by: Delon Haddock  Exercises - Supine Quadricep Sets  - 1 x daily - 7 x weekly - 2 sets - 10 reps - Supine Straight Leg Raises  - 1 x daily - 7 x weekly - 2 sets - 10 reps - Seated Long Arc Quad  - 1 x daily - 7 x weekly - 2 sets - 10 reps  ASSESSMENT:  CLINICAL IMPRESSION: Elner continues to make good functional progress.  She admits that she had a few days of soreness and a little setback after her fall.  She was able to resume prior exercises and even tolerated increased resistance on several exercises.   Her gait speed is slightly slower since her fall but she seems to be a little guarded.    She would benefit from ongoing PT for 1 time per week up until surgery to gain strength and tissue mobility at lateral left LE in preparation for the surgery.  OBJECTIVE IMPAIRMENTS: Abnormal gait, decreased balance, decreased knowledge of condition, difficulty walking, decreased ROM, decreased strength, increased edema, increased fascial restrictions, increased muscle spasms, impaired flexibility, and pain.   ACTIVITY LIMITATIONS: carrying, lifting, bending, sitting, standing, squatting, sleeping, stairs, transfers, bed mobility, bathing, and dressing  PARTICIPATION LIMITATIONS: meal prep, cleaning, laundry, driving, shopping, community activity, yard work, and church  PERSONAL FACTORS: Fitness and 1-2 comorbidities: RA, CAD are also affecting patient's functional outcome.   REHAB POTENTIAL: Good  CLINICAL DECISION MAKING: Stable/uncomplicated  EVALUATION COMPLEXITY: Low   GOALS: Goals reviewed with patient? Yes  SHORT TERM GOALS: Target date: 11/27/2022  Pain report to be no greater than 4/10  Baseline: Goal status: MET 12/17/22  2.  Patient will be independent with initial HEP  Baseline:  Goal status: MET 12/17/22  3.  Patient to demonstrate proper heel to toe progression on left LE Baseline:  Goal status: MET 12/17/22 with rollator walker  LONG TERM GOALS: Target date: 02/07/23   Patient to report pain no greater than 2/10  Baseline:  Goal status: In progress  2.  Patient to be independent with advanced HEP  Baseline:  Goal status: In progress  3.  Patient to be able to stand or walk for at least 15 min without leg pain  Baseline:  Goal status: INITIAL  4.  Functional scores to improve by 2-3 seconds Baseline:  Goal status: INITIAL  5.  FOTO to be 48 Baseline: 31 on 12/24/22 Goal status: In progress  6.  Patient to report 85% improvement in overall  symptoms Baseline:  Goal status: In progress    PLAN:  PT FREQUENCY: 1-2x/week  PT DURATION: 8 weeks  PLANNED INTERVENTIONS: Therapeutic exercises, Therapeutic activity, Neuromuscular re-education, Balance training, Gait training, Patient/Family education, Self Care, Joint mobilization, Stair training, DME instructions, Aquatic Therapy, Dry Needling, Electrical stimulation, Cryotherapy, Moist heat, Compression bandaging, Splintting, Taping, Vasopneumatic device, Ultrasound, Ionotophoresis 4mg /ml Dexamethasone , Manual therapy, and Re-evaluation  PLAN FOR NEXT SESSION:  Continue Nustep, IT band stretching on left, quad strengthening, hip strengthening, alignment training, proper footwear, gait training, stair training, prepare for upcoming surgery.    Delon B. Dick Hark, PT 01/02/23 3:37 PM [facility] [address], [address] [place], [place] [address] Phone # [phone] Fax [phone]

## 2023-01-06 ENCOUNTER — Ambulatory Visit: Payer: Medicare PPO | Admitting: Internal Medicine

## 2023-01-13 ENCOUNTER — Telehealth: Payer: Self-pay | Admitting: *Deleted

## 2023-01-13 NOTE — Telephone Encounter (Signed)
   Pre-operative Risk Assessment    Patient Name: Meghan Schwartz  DOB: [birth year] MRN: [id number]  DATE OF THE LAST VISIT: 10/14/22 DR. ROSS DATE OF NEXT VISIT: 04/14/23 Jari Favre, [facility]    Request for Surgical Clearance    Procedure:   LEFT TOTAL KNEE ARTHROPLASTY  Date of Surgery:  Clearance 04/14/23                                 Surgeon:  DR. Ollen Gross Surgeon's Group or Practice Name:  [facility] Phone number:  [phone] ATTN: Aida Raider Fax number:  [phone]   Type of Clearance Requested:   - Medical  - Pharmacy:  Hold Clopidogrel (Plavix)     Type of Anesthesia:   CHOICE   Additional requests/questions:    Elpidio Anis   01/13/2023, 5:41 PM

## 2023-01-14 NOTE — Telephone Encounter (Signed)
Left message to call back. Per preop APP we can schedule tele pre op appt. However she has appt 04/14/23 which is the same day as her surgery.   We can just reschedule the in office appt that was already planned.

## 2023-01-14 NOTE — Telephone Encounter (Signed)
   Name: Meghan Schwartz  DOB: 1950/12/22  MRN: [id number]  Primary Cardiologist: Dietrich Pates, MD   Preoperative team, please contact this patient and set up a phone call appointment for further preoperative risk assessment. Please obtain consent and complete medication review. Thank you for your help.  I confirm that guidance regarding antiplatelet and oral anticoagulation therapy has been completed and, if necessary, noted below.  Per protocol patient can hold Plavix 5 days prior to procedure and should restart postprocedure when surgically safe and hemostasis is achieved.  I also confirmed the patient resides in the state of [place]. As per [facility] Medical Board telemedicine laws, the patient must reside in the state in which the provider is licensed.   Napoleon Form, Leodis Rains, NP 01/14/2023, 7:10 AM [facility]

## 2023-01-16 NOTE — Telephone Encounter (Signed)
S/w the pt and she did not realize Dr. Lequita Halt scheduled the surgery yet. She has agreed to move 6 month f/u as she need pre op clearance now as well. She is hoping to have her surgery sooner than the Feb date listed as to why moved up appt to 02/2023 with cardiology. I will update all parties involved.

## 2023-01-17 ENCOUNTER — Other Ambulatory Visit: Payer: Self-pay | Admitting: Family Medicine

## 2023-01-17 ENCOUNTER — Telehealth: Payer: Self-pay

## 2023-01-17 DIAGNOSIS — Z1231 Encounter for screening mammogram for malignant neoplasm of breast: Secondary | ICD-10-CM

## 2023-01-17 NOTE — Telephone Encounter (Signed)
Opened in error

## 2023-01-30 ENCOUNTER — Telehealth: Payer: Self-pay | Admitting: Internal Medicine

## 2023-01-30 NOTE — Telephone Encounter (Signed)
Pt called requesting to speak with sharon about a wheelchair and some faxes that she needs to know about. Pt would like a call back at [phone]

## 2023-01-31 NOTE — Telephone Encounter (Signed)
LMOM returning patient's call.

## 2023-02-03 ENCOUNTER — Ambulatory Visit: Payer: Medicare PPO | Attending: Internal Medicine | Admitting: Internal Medicine

## 2023-02-03 VITALS — BP 114/68 | HR 79

## 2023-02-03 DIAGNOSIS — M25462 Effusion, left knee: Secondary | ICD-10-CM

## 2023-02-03 DIAGNOSIS — M069 Rheumatoid arthritis, unspecified: Secondary | ICD-10-CM

## 2023-02-03 MED ORDER — PREDNISONE 5 MG PO TABS: ORAL_TABLET | ORAL | 0 refills | Status: AC

## 2023-02-03 NOTE — Progress Notes (Deleted)
Office Visit Note  Patient: Meghan Schwartz             Date of Birth: 1950/07/28           MRN: [id number]             PCP: Camie Patience, FNP Referring: Camie Patience, FNP Visit Date: 02/03/2023   Subjective:  No chief complaint on file.   History of Present Illness: Meghan Schwartz is a 72 y.o. female here for follow up ***   Previous HPI    No Rheumatology ROS completed.   PMFS History:  Patient Active Problem List   Diagnosis Date Noted   Hypocalcemia 08/21/2022   Falls, initial encounter 08/21/2022   Prolonged QT interval 08/21/2022   Acute metabolic encephalopathy 08/21/2022   Essential hypertension 08/21/2022   AKI (acute kidney injury) (HCC) 08/21/2022   Chronic pain 08/21/2022   Chronic diastolic CHF (congestive heart failure) (HCC) 08/21/2022   Effusion, left knee 06/12/2022   Leg edema, left 12/25/2021   Claw toe, left 08/07/2021   Dog bite 03/05/2021   High risk medication use 02/05/2021   Dry mouth 02/05/2021   Pain in left foot 01/31/2021   Bunion of great toe of right foot 06/15/2020   Hypoxia 03/05/2019   Derangement of posterior horn of medial meniscus 02/02/2019   Derangement of posterior horn of lateral meniscus 02/02/2019   Meniscal cyst, unspecified laterality 12/01/2018   Torn medial meniscus 12/01/2018   Primary osteoarthritis of left knee 12/01/2018   Coronary artery disease involving native coronary artery of native heart without angina pectoris 09/14/2018   (HFpEF) heart failure with preserved ejection fraction (HCC)    Chronic pain of left knee 05/15/2018   Mass of left knee 05/15/2018   Non-ST elevation (NSTEMI) myocardial infarction [facility])    Chest pain 01/25/2018   RA (rheumatoid arthritis) (HCC) 01/25/2018   Hyperlipidemia 01/25/2018   Hypokalemia 01/25/2018   Depression with anxiety 01/25/2018   Tobacco abuse 01/25/2018   Lower extremity cellulitis 01/25/2018   Benzodiazepine dependence (HCC)    MDD (major depressive  disorder), recurrent severe, without psychosis (HCC) 10/02/2017   CTS (carpal tunnel syndrome) 06/08/2014    Past Medical History:  Diagnosis Date   Allergic rhinitis    Anxiety    Benzodiazepine dependence (HCC)    Bruxism    CAD (coronary artery disease)    S/p NSTEMI 01/2018 >> LHC: oLAD 20, pLAD 20; oD1 20, oLCx 40, pRCA 99 >> PCI: DES to prox RCA // Echo 01/2018: EF 50-55; prob inf-lat and inf HK   Chronic diastolic heart failure    Echocardiogram 07/2018: EF 55-60, grade 1 diastolic dysfunction, normal wall motion, normal GLS (-22.3), PASP 28   Chronic foot pain    bunions, torn ligaments-on chronic pain medication   CTS (carpal tunnel syndrome) 06/08/2014   Depression    High cholesterol    Hypertension    Low blood potassium    MDD (major depressive disorder) 10/02/2017   Migraine    Myocardial infarction (HCC) 02/01/2018   Pneumonia 1986   left lung   RA (rheumatoid arthritis) (HCC)     Family History  Problem Relation Age of Onset   Thyroid disease Mother    Breast cancer Mother    Parkinson's disease Father    Raynaud syndrome Maternal Aunt    Arthritis Maternal Grandmother    Heart attack Maternal Grandmother    Stroke Paternal Grandmother    Heart attack  Paternal Grandfather    Past Surgical History:  Procedure Laterality Date   BTL  2000   CARPAL TUNNEL RELEASE Left    CESAREAN SECTION  1978. 1983   CORONARY STENT INTERVENTION N/A 01/27/2018   Procedure: CORONARY STENT INTERVENTION;  Surgeon: Kathleene Hazel, MD;  [facility]: [facility];  Service: Cardiovascular;  Laterality: N/A;   eye implants     FOOT SURGERY Left 2008   front teeth removed after injury     KNEE ARTHROSCOPY WITH LATERAL MENISECTOMY Left 02/02/2019   Procedure: LEFT KNEE ARTHROSCOPY, MEDIAL AND LATERAL MENISECTOMY, EXCISION OF LEFT LATERAL MENISCAL CYST;  Surgeon: Valeria Batman, MD;  Location: [facility];  Service: Orthopedics;  Laterality: Left;   LEFT HEART CATH AND  CORONARY ANGIOGRAPHY N/A 01/27/2018   Procedure: LEFT HEART CATH AND CORONARY ANGIOGRAPHY;  Surgeon: Kathleene Hazel, MD;  [facility]: [facility];  Service: Cardiovascular;  Laterality: N/A;   pre cancerous lesion removed from forehead     TOTAL ABDOMINAL HYSTERECTOMY W/ BILATERAL SALPINGOOPHORECTOMY  2002   Dr. Jackelyn Knife   VAGINAL HYSTERECTOMY  2001   Social History   Social History Narrative   Lives at home with spouse.    Right handed.   Caffeine use: 2 cups coffee/day    8 oz soda/day    Immunization History  Administered Date(s) Administered   PFIZER(Purple Top)SARS-COV-2 Vaccination 04/10/2019, 05/05/2019, 10/19/2019, 06/20/2020   Pfizer Covid-19 Vaccine Bivalent Booster 39yrs & up 11/29/2020   Tdap 04/03/2018     Objective: Vital Signs: There were no vitals taken for this visit.   Physical Exam   Musculoskeletal Exam: ***  CDAI Exam: CDAI Score: -- Patient Global: --; Provider Global: -- Swollen: --; Tender: -- Joint Exam 02/03/2023   No joint exam has been documented for this visit   There is currently no information documented on the homunculus. Go to the Rheumatology activity and complete the homunculus joint exam.  Investigation: No additional findings.  Imaging: No results found.  Recent Labs: Lab Results  Component Value Date   WBC 4.6 12/18/2022   HGB 11.9 12/18/2022   PLT 202 12/18/2022   NA 143 10/22/2022   K 4.2 10/22/2022   CL 96 10/22/2022   CO2 33 (H) 10/22/2022   GLUCOSE 103 (H) 10/22/2022   BUN 16 10/22/2022   CREATININE 0.92 10/22/2022   BILITOT 0.5 12/18/2022   ALKPHOS 84 08/20/2022   AST 20 12/18/2022   ALT 16 12/18/2022   PROT 6.5 12/18/2022   ALBUMIN 2.7 (L) 08/23/2022   CALCIUM 9.8 10/22/2022   GFRAA 65 02/04/2020   QFTBGOLDPLUS NEGATIVE 06/12/2022    Speciality Comments: No specialty comments available.  Procedures:  No procedures performed Allergies: Erythromycin   Assessment / Plan:     Visit  Diagnoses: No diagnosis found.  ***  Orders: No orders of the defined types were placed in this encounter.  No orders of the defined types were placed in this encounter.    Follow-Up Instructions: No follow-ups on file.   Metta Clines, RT  Note - This record has been created using [employer].  Chart creation errors have been sought, but may not always  have been located. Such creation errors do not reflect on  the standard of medical care.

## 2023-02-03 NOTE — Telephone Encounter (Signed)
I called patient, patient coming in 02/03/2023 to have fluid drawn off knee, per Dr. Dimple Casey patient needs to get RX for wheelchair from Dr. Despina Hick since Dr. Despina Hick is performing knee replacement 03/2023.

## 2023-02-03 NOTE — Progress Notes (Signed)
   Procedure Note  Patient: Meghan Schwartz             Date of Birth: [birth year]           MRN: [id number]             Visit Date: 02/03/2023  Procedures: Visit Diagnoses:  1. Effusion, left knee   2. Rheumatoid arthritis, involving unspecified site, unspecified whether rheumatoid factor present (HCC)     Large Joint Inj: L knee on 02/03/2023 3:20 PM Indications: pain and joint swelling Details: 22 G 1.5 in needle, superolateral approach Medications: 3 mL lidocaine 1 % Aspirate: 59 mL clear and yellow Outcome: tolerated well, no immediate complications Procedure, treatment alternatives, risks and benefits explained, specific risks discussed. Consent was given by the patient. Immediately prior to procedure a time out was called to verify the correct patient, procedure, equipment, support staff and site/side marked as required. Patient was prepped and draped in the usual sterile fashion.     Here for follow up due to increased knee pain, swelling, and difficulty with range of movement. She had previous steroid injection by Dr. Lequita Halt within the past 2 months. Has not missed any Enbrel and no exacerbation of symptoms elsewhere. Repeated joint aspiration today with lidocaine injection. Oral prednisone taper prescribed.

## 2023-02-06 ENCOUNTER — Ambulatory Visit: Payer: Medicare PPO | Admitting: Internal Medicine

## 2023-02-06 DIAGNOSIS — F1121 Opioid dependence, in remission: Secondary | ICD-10-CM | POA: Diagnosis not present

## 2023-02-06 DIAGNOSIS — F332 Major depressive disorder, recurrent severe without psychotic features: Secondary | ICD-10-CM | POA: Diagnosis not present

## 2023-02-06 DIAGNOSIS — F1320 Sedative, hypnotic or anxiolytic dependence, uncomplicated: Secondary | ICD-10-CM | POA: Diagnosis not present

## 2023-02-06 DIAGNOSIS — F411 Generalized anxiety disorder: Secondary | ICD-10-CM | POA: Diagnosis not present

## 2023-02-07 ENCOUNTER — Other Ambulatory Visit: Payer: Self-pay | Admitting: Physician Assistant

## 2023-02-12 ENCOUNTER — Other Ambulatory Visit: Payer: Self-pay | Admitting: Family Medicine

## 2023-02-12 DIAGNOSIS — M85859 Other specified disorders of bone density and structure, unspecified thigh: Secondary | ICD-10-CM

## 2023-02-12 DIAGNOSIS — M8580 Other specified disorders of bone density and structure, unspecified site: Secondary | ICD-10-CM

## 2023-02-13 MED ORDER — LIDOCAINE HCL 1 % IJ SOLN
3.0000 mL | INTRAMUSCULAR | Status: AC | PRN
  Administered 2023-02-03: 3 mL

## 2023-02-17 ENCOUNTER — Other Ambulatory Visit: Payer: Medicare PPO

## 2023-02-26 NOTE — Progress Notes (Signed)
 Cardiology Office Note    Patient Name: Meghan Schwartz Date of Encounter: 02/26/2023  Primary Care Provider:  Dyane Anthony RAMAN, FNP Primary Cardiologist:  Vina Gull, MD Primary Electrophysiologist: None   Past Medical History    Past Medical History:  Diagnosis Date   Allergic rhinitis    Anxiety    Benzodiazepine dependence (HCC)    Bruxism    CAD (coronary artery disease)    S/p NSTEMI 01/2018 >> LHC: oLAD 20, pLAD 20; oD1 20, oLCx 40, pRCA 99 >> PCI: DES to prox RCA // Echo 01/2018: EF 50-55; prob inf-lat and inf HK   Chronic diastolic heart failure    Echocardiogram 07/2018: EF 55-60, grade 1 diastolic dysfunction, normal wall motion, normal GLS (-22.3), PASP 28   Chronic foot pain    bunions, torn ligaments-on chronic pain medication   CTS (carpal tunnel syndrome) 06/08/2014   Depression    High cholesterol    Hypertension    Low blood potassium    MDD (major depressive disorder) 10/02/2017   Migraine    Myocardial infarction (HCC) 02/01/2018   Pneumonia 1986   left lung   RA (rheumatoid arthritis) (HCC)     History of Present Illness  Meghan Schwartz is a 73 y.o. female with a PMH of CAD s/p NSTEMI 2019 with PCI/DES to RCA with 40% LCX and 20% D1, Ostial, HFpEF, HLD, HFpEF, rheumatoid arthritis, tobacco abuse who presents today for preoperative clearance and follow-up.  Meghan Schwartz seen initially for complaint of chest pain with radiating and shoulder pain.  Went ACS workup and was found to have NSTEMI completed LHC that showed stenosis in mid RCA that was treated with DES x 1.  2D echo was completed that showed EF of 50-55% with inferior lateral and inferior hypokinesis.  She was noted to have shortness of breath and intermittent swelling and was placed on Lasix .  She was admitted to the hospital on 03/2022 for hypokalemia and diuretics were held with potassium repleted.  She was seen in follow-up and restarted on torsemide  by PCP and potassium supplementation.  She was last seen  by Dr. Gull on 09/2022 for follow-up visit.  She was still experiencing some edema however breathing was stable and 2D echo was repeated that showed stable LV function of 55 to 60% with no RWMA and grade 1 DD with no valvular abnormalities noted.  She presents today for preoperative follow-up.  Meghan Schwartz presents today with her husband for preoperative clearance.   She is scheduled for a knee replacement surgery on February 17th. She is currently on Plavix , a blood thinner, which needs to be held five days before the procedure. The patient also takes torsemide  and potassium due to a previous drop in potassium levels. The patient expresses frustration with the limitations these medications have imposed on her life, particularly the frequent urination caused by torsemide . The patient also reports difficulty with mobility due to her knee condition, which has significantly impacted her quality of life.She is currently euvolemic on examination and blood pressures have been stable at 108/64.  She reports compliance with her current medication regimen for extractions.    Patient denies chest pain, palpitations, dyspnea, PND, orthopnea, nausea, vomiting, dizziness, syncope, edema, weight gain, or early satiety.   Review of Systems  Please see the history of present illness.    All other systems reviewed and are otherwise negative except as noted above.  Physical Exam    Wt Readings from Last 3 Encounters:  12/18/22 161 lb (73 kg)  10/30/22 157 lb (71.2 kg)  10/14/22 158 lb 12.8 oz (72 kg)   CD:Uyzmz were no vitals filed for this visit.,There is no height or weight on file to calculate BMI. GEN: Well nourished, well developed in no acute distress Neck: No JVD; No carotid bruits Pulmonary: Clear to auscultation without rales, wheezing or rhonchi  Cardiovascular: Normal rate. Regular rhythm. Normal S1. Normal S2.   Murmurs: There is no murmur.  ABDOMEN: Soft, non-tender, non-distended EXTREMITIES:  No  edema; No deformity   EKG/LABS/ Recent Cardiac Studies   ECG personally reviewed by me today -sinus rhythm with rate of no acute changes consistent with previous EKG.  Risk Assessment/Calculations:     Lab Results  Component Value Date   WBC 4.6 12/18/2022   HGB 11.9 12/18/2022   HCT 37.4 12/18/2022   MCV 86.8 12/18/2022   PLT 202 12/18/2022   Lab Results  Component Value Date   CREATININE 0.92 10/22/2022   BUN 16 10/22/2022   NA 143 10/22/2022   K 4.2 10/22/2022   CL 96 10/22/2022   CO2 33 (H) 10/22/2022   Lab Results  Component Value Date   CHOL 171 02/04/2020   HDL 67 02/04/2020   LDLCALC 88 02/04/2020   TRIG 88 02/04/2020   CHOLHDL 2.6 02/04/2020    No results found for: HGBA1C Assessment & Plan    1.  Preoperative clearance: Patient's RCRI score is 11% The patient affirms she has been doing well without any new cardiac symptoms. They are able to achieve 5 METS without cardiac limitations. Therefore, based on ACC/AHA guidelines, the patient would be at acceptable risk for the planned procedure without further cardiovascular testing. The patient was advised that if she develops new symptoms prior to surgery to contact our office to arrange for a follow-up visit, and she verbalized understanding.   -Patient can hold Plavix  5 days prior to procedure and should restart postprocedure when surgically safe and hemostasis is achieved.  2.  History of CAD: -s/p NSTEMI 2019 with DES/PCI to RCA today reports no chest pain or angina since previous follow-up. -Continue current GDMT with Plavix  75 mg daily, metoprolol  25 mg daily Crestor  40 mg daily  3.  HFpEF: -Most recent 2D echo completed showing EF of 55 to 60% -Today patient is euvolemic on examination Patient reports frequent urination and limitation in daily activities due to Torsemide  use. Currently taking 20mg  Torsemide  daily with 2 potassium supplements. No current signs of fluid overload on physical  examination. -Reduce Torsemide  to 20mg  on Monday, Wednesday, and Friday. -Take potassium supplement only on days when Torsemide  is taken. -Monitor weight daily and report if gain of 2 pounds in 24 hours or 5 pounds in a week.  4.  Hyperlipidemia: -Cholesterol currently managed by PCP Continue Crestor  40 mg daily  Disposition: Follow-up with Vina Gull, MD or APP in as scheduled months    Signed, Wyn Raddle, Jackee Shove, NP 02/26/2023, 11:49 AM [facility]

## 2023-02-27 ENCOUNTER — Ambulatory Visit: Payer: Medicare PPO | Attending: Nurse Practitioner | Admitting: Nurse Practitioner

## 2023-02-27 ENCOUNTER — Encounter: Payer: Self-pay | Admitting: Nurse Practitioner

## 2023-02-27 VITALS — BP 108/64 | HR 75 | Resp 16 | Ht 64.0 in | Wt 155.0 lb

## 2023-02-27 DIAGNOSIS — I5032 Chronic diastolic (congestive) heart failure: Secondary | ICD-10-CM

## 2023-02-27 DIAGNOSIS — E782 Mixed hyperlipidemia: Secondary | ICD-10-CM

## 2023-02-27 DIAGNOSIS — Z01810 Encounter for preprocedural cardiovascular examination: Secondary | ICD-10-CM

## 2023-02-27 DIAGNOSIS — I2510 Atherosclerotic heart disease of native coronary artery without angina pectoris: Secondary | ICD-10-CM

## 2023-02-27 MED ORDER — POTASSIUM CHLORIDE CRYS ER 20 MEQ PO TBCR: 40.0000 meq | EXTENDED_RELEASE_TABLET | ORAL | 3 refills | Status: DC

## 2023-02-27 MED ORDER — TORSEMIDE 20 MG PO TABS: 20.0000 mg | ORAL_TABLET | ORAL | 3 refills | Status: DC

## 2023-02-27 NOTE — Patient Instructions (Signed)
 Medication Instructions:  Your physician has recommended you make the following change in your medication:  1-TAKE Torsemide  20 mg by mouth on Monday, Wednesday, and Friday. 2-TAKE potassium 20 meq by mouth on Monday, Wednesday, and Friday.  *If you need a refill on your cardiac medications before your next appointment, please call your pharmacy*  Lab Work: If you have labs (blood work) drawn today and your tests are completely normal, you will receive your results only by: MyChart Message (if you have MyChart) OR A paper copy in the mail If you have any lab test that is abnormal or we need to change your treatment, we will call you to review the results.  Testing/Procedures: None ordered today.  Follow-Up: At [facility], you and your health needs are our priority.  As part of our continuing mission to provide you with exceptional heart care, we have created designated Provider Care Teams.  These Care Teams include your primary Cardiologist (physician) and Advanced Practice Providers (APPs -  Physician Assistants and Nurse Practitioners) who all work together to provide you with the care you need, when you need it.  We recommend signing up for the patient portal called MyChart.  Sign up information is provided on this After Visit Summary.  MyChart is used to connect with patients for Virtual Visits (Telemedicine).  Patients are able to view lab/test results, encounter notes, upcoming appointments, etc.  Non-urgent messages can be sent to your provider as well.   To learn more about what you can do with MyChart, go to [email or web address].    Your next appointment:   6 month(s)  Provider:   Vina Gull, MD     Other Instructions Please weigh yourself daily at the same time with the same amount of clothing on. If your weight increases 3 lbs in a 24 hour period or 5 lbs in a week, give our office a call at [phone].

## 2023-03-04 DIAGNOSIS — M069 Rheumatoid arthritis, unspecified: Secondary | ICD-10-CM | POA: Diagnosis not present

## 2023-03-04 DIAGNOSIS — F331 Major depressive disorder, recurrent, moderate: Secondary | ICD-10-CM | POA: Diagnosis not present

## 2023-03-04 DIAGNOSIS — I5032 Chronic diastolic (congestive) heart failure: Secondary | ICD-10-CM | POA: Diagnosis not present

## 2023-03-04 DIAGNOSIS — M1712 Unilateral primary osteoarthritis, left knee: Secondary | ICD-10-CM | POA: Diagnosis not present

## 2023-03-04 DIAGNOSIS — M25562 Pain in left knee: Secondary | ICD-10-CM | POA: Diagnosis not present

## 2023-03-04 DIAGNOSIS — I10 Essential (primary) hypertension: Secondary | ICD-10-CM | POA: Diagnosis not present

## 2023-03-05 ENCOUNTER — Ambulatory Visit
Admission: RE | Admit: 2023-03-05 | Discharge: 2023-03-05 | Disposition: A | Discharge status: Home / Self Care | Payer: Medicare PPO | Source: Ambulatory Visit | Attending: Family Medicine | Admitting: Family Medicine

## 2023-03-05 DIAGNOSIS — Z1231 Encounter for screening mammogram for malignant neoplasm of breast: Secondary | ICD-10-CM

## 2023-03-11 ENCOUNTER — Other Ambulatory Visit: Payer: Self-pay | Admitting: Internal Medicine

## 2023-03-19 ENCOUNTER — Encounter: Payer: Self-pay | Admitting: Internal Medicine

## 2023-03-19 ENCOUNTER — Ambulatory Visit: Payer: Medicare PPO | Attending: Internal Medicine | Admitting: Internal Medicine

## 2023-03-19 ENCOUNTER — Other Ambulatory Visit: Payer: Self-pay | Admitting: Internal Medicine

## 2023-03-19 VITALS — BP 130/77 | HR 58 | Resp 14 | Ht 63.0 in | Wt 162.0 lb

## 2023-03-19 DIAGNOSIS — Z79899 Other long term (current) drug therapy: Secondary | ICD-10-CM

## 2023-03-19 DIAGNOSIS — M069 Rheumatoid arthritis, unspecified: Secondary | ICD-10-CM

## 2023-03-19 DIAGNOSIS — E876 Hypokalemia: Secondary | ICD-10-CM

## 2023-03-19 MED ORDER — ENBREL SURECLICK 50 MG/ML ~~LOC~~ SOAJ
50.0000 mg | SUBCUTANEOUS | 2 refills | Status: DC
Start: 2023-03-19 — End: 2023-07-11

## 2023-03-19 NOTE — Telephone Encounter (Signed)
Patient contacted the office to request a medication refill.   1. Name of Medication: Enbrel  2. How are you currently taking this medication (dosage and times per day)? One injection a week   3. What pharmacy would you like for that to be sent to?   [facility]

## 2023-03-19 NOTE — Telephone Encounter (Signed)
Last Fill: 10/31/2022  Labs: 12/18/2022 CBC MCHC 31.8  10/22/2022 BMP  Glucose 103 CO2 33  TB Gold: 06/12/2022 Negative   Next Visit: 07/23/2022  Last Visit: 03/19/2023  [id number] arthritis, involving unspecified site, unspecified whether rheumatoid factor present   Current Dose per office note 03/19/2023: Enbrel 50 mg [place] weekly   Patient had labs done today that are not resulted yet.   Okay to refill Enbrel?

## 2023-03-19 NOTE — Progress Notes (Signed)
Office Visit Note  Patient: Meghan Schwartz             Date of Birth: 11/16/1950           MRN: [id number]             PCP: Sabino Dick, DO Referring: Camie Patience, FNP Visit Date: 03/19/2023   Subjective:  Follow-up (Patient states she would like to get her potassium checked. Patient states yesterday she could not hold anything and she felt lupey. Patient states she woke up and her weight increased by 8 pounds and then she took the torsemide and went back to sleep and woke up and lost the 8 pounds. Patient states she is now only taking the torsemide and potassium Monday, Wednesday, and Friday instead of everyday. )   Discussed the use of AI scribe software for clinical note transcription with the patient, who gave verbal consent to proceed.  History of Present Illness   Meghan Schwartz is a 73 y.o. female here for follow up for RA on Enbrel 50 mg [place] weekly and severe osteoarthritis. She is scheduled for a knee replacement surgery on 2/17.  They also have a Baker's cyst, which causes sometimes causes more discomfort than the knee surface itself, and they wonder if it will be addressed during the surgery.  The patient is currently taking Enbrel for their arthritis, but they have been advised to pause this medication two weeks before and after their surgery to minimize the risk of delayed wound healing or bacterial infection.  The patient also reports pain on the side of their leg, which they describe as a pressing sensation in three specific areas. They note that the pain is more pronounced when they walk.  In addition to their arthritis, the patient has a history of low potassium levels. They recently experienced an overnight weight gain of eight pounds, which they attribute to a change in their medication regimen. They had been taking torsemide and potassium daily, but a new physician's assistant recommended they take these medications only on Monday, Wednesday, and Friday. After the  weight gain, they returned to their daily regimen and lost the weight.  They also report that their skin is shedding and they have some scabbing and lesions, which they are trying not to scratch.   Previous HPI 12/18/22 Meghan Schwartz is a 73 y.o. female here for follow up for rheumatoid arthritis on Enbrel 50 mg [place] weekly and regarding chronic osteoarthritis pain in the left knee.  Again had temporary symptom relief after aspiration injection of knee at her last visit but still has pain and instability and catching while weightbearing.  She developed trouble with her left eye with what she describes as some type of membrane associated with the previous cataract surgery.  Seeing Dr. Dione Booze for management.  She is also discussing knee replacement surgery with Dr. Antony Odea.  They talked at some length regarding she has high pre-existing cardiovascular risk factors for surgery but current function and quality of life is severely impaired and she has had only limited response to numerous injections and oral medications and does not want to go back on high-dose pain medicine.   Previous HPI 10/30/22 Meghan Schwartz is a 73 y.o. female here for follow up today for left knee pain and swelling. This is worse especially with pain in the posterior knee in the past week. This was not preceded by any specific injury illness or new activity. She is not  wearing the knee brace recommended by Dr. August Saucer because it is too bulky on her swollen knee and hits against the right knee. Also has pain and tenderness in an area below and lateral to the knee that is chronic but worse lately. She remains on enbrel 50 mg [place] weekly without interruption for her RA and no increased swelling elsewhere.   Previous HPI 09/11/22 Meghan Schwartz is a 73 y.o. female here for follow up for rheumatoid arthritis on Enbrel 50 mg subcu weekly and osteoarthritis of the left knee.  We last saw her in April with repeat aspiration of the knee and popliteal cyst  with no steroid injection at the time due to recent treatment.  She feels the cyst and back of knee pain has remained improved since that time.  She had a hospitalization last month due to severe hypokalemia down to 2.0.  Had preceding symptoms of worsening muscle weakness with change in posture and worsening mobility for the preceding weeks until waking up with profound muscle weakness inability to stand and severe constipation.  Had some adjustment with her torsemide and potassium supplementation and now back to about her baseline.   Previous HPI 07/30/22 Meghan Schwartz is a 73 y.o. female with rheumatoid arthritis on Enbrel 50 mg subcu weekly and osteoarthritis here for left knee joint pain and swelling.  At her last visit she felt there was more improvement in her knee pain with the Baker's cyst aspiration as well as joint injection versus previous aspiration injections at only the suprapatellar pouch.  Subsequently saw Dr. August Saucer for evaluation of her severe primary osteoarthritis of the knee and also underwent joint aspiration that time with 45 cc fluid removed.  He discussed options for her realistically the only available procedure would be total knee arthroplasty.  She is hesitant about whether or not to pursue this due to associated pain and required rehabilitation and also cardiovascular risk.  Though she was cleared by her cardiologist to proceed with this if she wishes.  Currently has back due to increasing pain and swelling in the knee again and she is interested whether the popliteal cyst could be aspirated or injected again.  She is not noticing increased pain or swelling in other joints besides the knee.  No interruption or change to her Enbrel or other medications. No new infections or injuries.   Previous HPI 06/12/22 Meghan Schwartz is a 73 y.o. female here for follow up for RA on Enbrel 50 mg [place] weekly and osteoarthritis with chronic left knee pain and swelling. We aspirated the left knee in March  but she saw quick re accumulation of fluid.  Outside of left knee has not experienced much exacerbation of upper extremity pain.  Also describes some clicking or popping sensation frequently occurring and with decreased steadiness on her feet.  She is usually able to walk adequately with some form of assistive device at least a cane.   Previous HPI 05/06/22 MITSUKO MASTROGIOVANNI is a 73 y.o. female here for follow up for RA on Enbrel 50 mg subcu weekly and osteoarthritis.  Since her last visit she had an issue with worsening pedal edema and a very large associated weight gain.  She was referred to see Dr. Tenny Craw with cardiology.  She was prescribed metolazone to combine with her torsemide and saw a rapid diuresis with 15 pound weight loss in 1 day.  Initially had associated skin peeling since then still has erythema and rough or thick skin  over the affected areas but without any reaccumulation of the weight and pitting fluid.  Blood pressure has been somewhat low.  She has had some falls due to instability she is noticing trouble where she cannot apply weight onto her right foot evenly just gets pressure around the base of the second and third toe.  And left knee is causing more problems with reaccumulation of the large effusion and also with posterior swelling from baker's cyst.     Previous HPI 02/05/21 CHALISA DELRE is a 73 y.o. female her for rheumatoid arthritis on Enbrel 50 mg [place] weekly for which she has been seeing Dr. Nickola Major previously. She was originally diagnosed about 10 or 11 years ago due to development of joint pain, stiffness, synovitis, and progressive joint deformities primarily starting in the feet and knees.  Subsequently proceeded to have increased joint pains involving bilateral hands with some swelling at the PIP and MCP joints.  She started treatment with Enbrel injections with some improvement in the lower extremity joint pains she recalls still having a lot of pain symptoms and pronounced fatigue  that did not clear with this treatment.  Combination treatment was tried with methotrexate with significant GI intolerance.  She tried leflunomide but developed substantial alopecia so discontinued the medicine.  She switched to hydroxychloroquine combination treatment which was being well-tolerated but she has had multiple new or worsening medical problems over the past few years with concern of medication related effects. She had hand numbness in the past improved after left carpal tunnel release surgery but she has some residual muscle atrophy in thenar prominence. She had left knee meniscectomy in 2020. She suffered NSTEMI in 01/2018 with DES placement in RCA for 99% blockage and has chronic diastolic CHF. Her edema is generally controlled while taking torsemide but does rapidly re accumulate fluid off this medication. Ophthalmology evaluation indicated significant visual field deficits findings consistent with age-related macular degeneration of both eyes and recommended avoidance of this medication.  She also has considerable osteoarthritis especially in her knees and feet previous surgical reconstruction on the left foot. Overall she feels her gait is not very stable due to these problems.   DMARD Hx Enbrel - current   HCQ - macular disease? LEF - alopecia MTX - GI intolerance.    Review of Systems  Constitutional:  Positive for fatigue.  HENT:  Positive for mouth dryness. Negative for mouth sores.   Eyes:  Negative for dryness.  Respiratory:  Positive for shortness of breath.   Cardiovascular:  Negative for chest pain and palpitations.  Gastrointestinal:  Positive for constipation and diarrhea. Negative for blood in stool.  Endocrine: Positive for increased urination.  Genitourinary:  Positive for involuntary urination.  Musculoskeletal:  Positive for joint pain, gait problem, joint pain, joint swelling, myalgias, muscle weakness, morning stiffness, muscle tenderness and myalgias.   Skin:  Negative for color change, rash, hair loss and sensitivity to sunlight.  Allergic/Immunologic: Positive for susceptible to infections.  Neurological:  Positive for dizziness and headaches.  Hematological:  Negative for swollen glands.  Psychiatric/Behavioral:  Positive for depressed mood. Negative for sleep disturbance. The patient is nervous/anxious.     PMFS History:  Patient Active Problem List   Diagnosis Date Noted   Hypocalcemia 08/21/2022   Falls, initial encounter 08/21/2022   Prolonged QT interval 08/21/2022   Acute metabolic encephalopathy 08/21/2022   Essential hypertension 08/21/2022   AKI (acute kidney injury) (HCC) 08/21/2022   Chronic pain 08/21/2022   Chronic diastolic  CHF (congestive heart failure) (HCC) 08/21/2022   Effusion, left knee 06/12/2022   Leg edema, left 12/25/2021   Claw toe, left 08/07/2021   Dog bite 03/05/2021   High risk medication use 02/05/2021   Dry mouth 02/05/2021   Pain in left foot 01/31/2021   Bunion of great toe of right foot 06/15/2020   Hypoxia 03/05/2019   Derangement of posterior horn of medial meniscus 02/02/2019   Derangement of posterior horn of lateral meniscus 02/02/2019   Meniscal cyst, unspecified laterality 12/01/2018   Torn medial meniscus 12/01/2018   Primary osteoarthritis of left knee 12/01/2018   Coronary artery disease involving native coronary artery of native heart without angina pectoris 09/14/2018   (HFpEF) heart failure with preserved ejection fraction (HCC)    Chronic pain of left knee 05/15/2018   Mass of left knee 05/15/2018   Non-ST elevation (NSTEMI) myocardial infarction [facility])    Chest pain 01/25/2018   RA (rheumatoid arthritis) (HCC) 01/25/2018   Hyperlipidemia 01/25/2018   Hypokalemia 01/25/2018   Depression with anxiety 01/25/2018   Tobacco abuse 01/25/2018   Lower extremity cellulitis 01/25/2018   Benzodiazepine dependence (HCC)    MDD (major depressive disorder), recurrent severe, without  psychosis (HCC) 10/02/2017   CTS (carpal tunnel syndrome) 06/08/2014    Past Medical History:  Diagnosis Date   Allergic rhinitis    Anxiety    Benzodiazepine dependence (HCC)    Bruxism    CAD (coronary artery disease)    S/p NSTEMI 01/2018 >> LHC: oLAD 20, pLAD 20; oD1 20, oLCx 40, pRCA 99 >> PCI: DES to prox RCA // Echo 01/2018: EF 50-55; prob inf-lat and inf HK   Chronic diastolic heart failure    Echocardiogram 07/2018: EF 55-60, grade 1 diastolic dysfunction, normal wall motion, normal GLS (-22.3), PASP 28   Chronic foot pain    bunions, torn ligaments-on chronic pain medication   CTS (carpal tunnel syndrome) 06/08/2014   Depression    High cholesterol    Hypertension    Low blood potassium    MDD (major depressive disorder) 10/02/2017   Migraine    Myocardial infarction (HCC) 02/01/2018   Pneumonia 1986   left lung   RA (rheumatoid arthritis) (HCC)     Family History  Problem Relation Age of Onset   Thyroid disease Mother    Breast cancer Mother    Parkinson's disease Father    Raynaud syndrome Maternal Aunt    Arthritis Maternal Grandmother    Heart attack Maternal Grandmother    Stroke Paternal Grandmother    Heart attack Paternal Grandfather    Past Surgical History:  Procedure Laterality Date   BTL  2000   CARPAL TUNNEL RELEASE Left    CESAREAN SECTION  1978. 1983   CORONARY STENT INTERVENTION N/A 01/27/2018   Procedure: CORONARY STENT INTERVENTION;  Surgeon: Kathleene Hazel, MD;  Location: [facility];  Service: Cardiovascular;  Laterality: N/A;   eye implants     FOOT SURGERY Left 2008   front teeth removed after injury     KNEE ARTHROSCOPY WITH LATERAL MENISECTOMY Left 02/02/2019   Procedure: LEFT KNEE ARTHROSCOPY, MEDIAL AND LATERAL MENISECTOMY, EXCISION OF LEFT LATERAL MENISCAL CYST;  Surgeon: Valeria Batman, MD;  Location: [facility];  Service: Orthopedics;  Laterality: Left;   LEFT HEART CATH AND CORONARY ANGIOGRAPHY N/A 01/27/2018    Procedure: LEFT HEART CATH AND CORONARY ANGIOGRAPHY;  Surgeon: Kathleene Hazel, MD;  Location: [facility];  Service: Cardiovascular;  Laterality: N/A;   pre cancerous lesion removed from forehead     TOTAL ABDOMINAL HYSTERECTOMY W/ BILATERAL SALPINGOOPHORECTOMY  2002   Dr. Jackelyn Knife   VAGINAL HYSTERECTOMY  2001   Social History   Social History Narrative   Lives at home with spouse.    Right handed.   Caffeine use: 2 cups coffee/day    8 oz soda/day    Immunization History  Administered Date(s) Administered   PFIZER(Purple Top)SARS-COV-2 Vaccination 04/10/2019, 05/05/2019, 10/19/2019, 06/20/2020   Pfizer Covid-19 Vaccine Bivalent Booster 20yrs & up 11/29/2020   Tdap 04/03/2018     Objective: Vital Signs: BP 130/77 (BP Location: Left Arm, Patient Position: Sitting, Cuff Size: Normal)   Pulse (!) 58   Resp 14   Ht 5\' 3"  (1.6 m)   Wt 162 lb (73.5 kg)   BMI 28.70 kg/m    Physical Exam Eyes:     Conjunctiva/sclera: Conjunctivae normal.  Cardiovascular:     Rate and Rhythm: Normal rate and regular rhythm.  Pulmonary:     Effort: Pulmonary effort is normal.     Breath sounds: Normal breath sounds.  Lymphadenopathy:     Cervical: No cervical adenopathy.  Skin:    General: Skin is warm and dry.     Comments: Few excoriations, several hyperpigmented raised nodules on forearms some with excoriation  Neurological:     Mental Status: She is alert.  Psychiatric:        Mood and Affect: Mood normal.      Musculoskeletal Exam:  Neck full ROM no tenderness Shoulders full ROM no tenderness or swelling Elbows full ROM no tenderness or swelling Wrists full ROM no tenderness or swelling Fingers full ROM no tenderness or swelling No paraspinal tenderness to palpation over upper and lower back Hip normal internal and external rotation without pain, no tenderness to lateral hip palpation Knees full ROM no tenderness or swelling Ankles full ROM no tenderness or  swelling MTPs full ROM no tenderness or swelling   Investigation: No additional findings.  Imaging: MM 3D SCREENING MAMMOGRAM BILATERAL BREAST Result Date: 03/06/2023 CLINICAL DATA:  Screening. EXAM: DIGITAL SCREENING BILATERAL MAMMOGRAM WITH TOMOSYNTHESIS AND CAD TECHNIQUE: Bilateral screening digital craniocaudal and mediolateral oblique mammograms were obtained. Bilateral screening digital breast tomosynthesis was performed. The images were evaluated with computer-aided detection. COMPARISON:  Previous exam(s). ACR Breast Density Category c: The breasts are heterogeneously dense, which may obscure small masses. FINDINGS: There are no findings suspicious for malignancy. IMPRESSION: No mammographic evidence of malignancy. A result letter of this screening mammogram will be mailed directly to the patient. RECOMMENDATION: Screening mammogram in one year. (Code:SM-B-01Y) BI-RADS CATEGORY  1: Negative. Electronically Signed   By: Meda Klinefelter M.D.   On: 03/06/2023 12:42    Recent Labs: Lab Results  Component Value Date   WBC 4.6 12/18/2022   HGB 11.9 12/18/2022   PLT 202 12/18/2022   NA 143 10/22/2022   K 4.2 10/22/2022   CL 96 10/22/2022   CO2 33 (H) 10/22/2022   GLUCOSE 103 (H) 10/22/2022   BUN 16 10/22/2022   CREATININE 0.92 10/22/2022   BILITOT 0.5 12/18/2022   ALKPHOS 84 08/20/2022   AST 20 12/18/2022   ALT 16 12/18/2022   PROT 6.5 12/18/2022   ALBUMIN 2.7 (L) 08/23/2022   CALCIUM 9.8 10/22/2022   GFRAA 65 02/04/2020   QFTBGOLDPLUS NEGATIVE 06/12/2022    Speciality Comments: No specialty comments available.  Procedures:  No procedures performed Allergies: Erythromycin   Assessment /  Plan:     Visit Diagnoses: Rheumatoid arthritis, involving unspecified site, unspecified whether rheumatoid factor present (HCC) - Plan: Sedimentation rate Currently on Enbrel 50 mg [place] weekly and joint inflammation appears well controlled. Discussed the need to pause Enbrel before and  after surgery to minimize risk of infection and delayed wound healing. -Take one more dose of Enbrel then pause for two weeks prior to surgery. -Resume Enbrel two weeks after surgery or after follow-up with the surgeon, ensuring there is no open wound.  High risk medication use - Plan: CBC with Differential/Platelet, COMPLETE METABOLIC PANEL WITH GFR Checking CBC and CMP for medication monitoring on long term use of Enbrel also metabolic panel for concern with recent large weight fluctuation and change in torsemide dosing, with severely low potassium episode last year  Hypokalemia - Plan: COMPLETE METABOLIC PANEL WITH GFR  Knee Osteoarthritis Severe valgus deformity and pain, scheduled for knee replacement surgery in less than a month. Discussed the surgeon's reputation and the process of the surgery. Do not recommend any repeat aspiration or injection today due to upcoming surgery. -Continue with the planned surgery.  Fluid Retention Recent weight gain of eight pounds overnight, possibly due to decreased Torsemide and potassium intake. Returned to normal weight after resuming daily Torsemide. -Return to daily regimen of Torsemide and potassium. -Check blood work, specifically potassium levels, to ensure they are within normal range.  Skin Lesions Noted skin lesions and scabbing, possibly due to dryness or actinic keratoses. -Apply unscented moisturizing lotion to help rebuild moisture barrier and avoid scratching. -Follow-up with dermatologist as scheduled.       Orders: Orders Placed This Encounter  Procedures   CBC with Differential/Platelet   COMPLETE METABOLIC PANEL WITH GFR   Sedimentation rate   No orders of the defined types were placed in this encounter.    Follow-Up Instructions: No follow-ups on file.   Fuller Plan, MD  Note - This record has been created using [employer].  Chart creation errors have been sought, but may not always  have been located.  Such creation errors do not reflect on  the standard of medical care.

## 2023-03-20 DIAGNOSIS — F1320 Sedative, hypnotic or anxiolytic dependence, uncomplicated: Secondary | ICD-10-CM | POA: Diagnosis not present

## 2023-03-20 DIAGNOSIS — F411 Generalized anxiety disorder: Secondary | ICD-10-CM | POA: Diagnosis not present

## 2023-03-20 DIAGNOSIS — F1121 Opioid dependence, in remission: Secondary | ICD-10-CM | POA: Diagnosis not present

## 2023-03-20 DIAGNOSIS — F332 Major depressive disorder, recurrent severe without psychotic features: Secondary | ICD-10-CM | POA: Diagnosis not present

## 2023-03-20 LAB — COMPLETE METABOLIC PANEL WITH GFR
AG Ratio: 1.5 (calc) (ref 1.0–2.5)
ALT: 16 U/L (ref 6–29)
AST: 23 U/L (ref 10–35)
Albumin: 4.2 g/dL (ref 3.6–5.1)
Alkaline phosphatase (APISO): 84 U/L (ref 37–153)
BUN: 19 mg/dL (ref 7–25)
CO2: 33 mmol/L — ABNORMAL HIGH (ref 20–32)
Calcium: 10 mg/dL (ref 8.6–10.4)
Chloride: 104 mmol/L (ref 98–110)
Creat: 0.89 mg/dL (ref 0.60–1.00)
Globulin: 2.8 g/dL (ref 1.9–3.7)
Glucose, Bld: 99 mg/dL (ref 65–99)
Potassium: 5.3 mmol/L (ref 3.5–5.3)
Sodium: 143 mmol/L (ref 135–146)
Total Bilirubin: 0.8 mg/dL (ref 0.2–1.2)
Total Protein: 7 g/dL (ref 6.1–8.1)
eGFR: 68 mL/min/{1.73_m2} (ref >=60)

## 2023-03-20 LAB — CBC WITH DIFFERENTIAL/PLATELET
Absolute Lymphocytes: 1106 {cells}/uL (ref 850–3900)
Absolute Monocytes: 406 {cells}/uL (ref 200–950)
Basophils Absolute: 32 {cells}/uL (ref 0–200)
Basophils Relative: 0.9 %
Eosinophils Absolute: 252 {cells}/uL (ref 15–500)
Eosinophils Relative: 7.2 %
HCT: 35.8 % (ref 35.0–45.0)
Hemoglobin: 11.9 g/dL (ref 11.7–15.5)
MCH: 28.8 pg (ref 27.0–33.0)
MCHC: 33.2 g/dL (ref 32.0–36.0)
MCV: 86.7 fL (ref 80.0–100.0)
MPV: 9.5 fL (ref 7.5–12.5)
Monocytes Relative: 11.6 %
Neutro Abs: 1705 {cells}/uL (ref 1500–7800)
Neutrophils Relative %: 48.7 %
Platelets: 156 10*3/uL (ref 140–400)
RBC: 4.13 10*6/uL (ref 3.80–5.10)
RDW: 12.5 % (ref 11.0–15.0)
Total Lymphocyte: 31.6 %
WBC: 3.5 10*3/uL — ABNORMAL LOW (ref 3.8–10.8)

## 2023-03-20 LAB — SEDIMENTATION RATE: Sed Rate: 17 mm/h (ref 0–30)

## 2023-03-20 NOTE — H&P (Signed)
TOTAL KNEE ADMISSION H&P  Patient is being admitted for left total knee arthroplasty.  Subjective:  Chief Complaint: Left knee pain.  HPI: Meghan Schwartz, 73 y.o. female has a history of pain and functional disability in the left knee due to arthritis and has failed non-surgical conservative treatments for greater than 12 weeks to include corticosteriod injections, viscosupplementation injections, use of assistive devices, and activity modification. Onset of symptoms was gradual, starting several years ago with gradually worsening course since that time. The patient noted no past surgery on the left knee.  Patient currently rates pain in the left knee at 7 out of 10 with activity. Patient has worsening of pain with activity and weight bearing and pain that interferes with activities of daily living. Patient has evidence of periarticular osteophytes and joint space narrowing by imaging studies. There is no active infection.  Patient Active Problem List   Diagnosis Date Noted   Hypocalcemia 08/21/2022   Falls, initial encounter 08/21/2022   Prolonged QT interval 08/21/2022   Acute metabolic encephalopathy 08/21/2022   Essential hypertension 08/21/2022   AKI (acute kidney injury) (HCC) 08/21/2022   Chronic pain 08/21/2022   Chronic diastolic CHF (congestive heart failure) (HCC) 08/21/2022   Effusion, left knee 06/12/2022   Leg edema, left 12/25/2021   Claw toe, left 08/07/2021   Dog bite 03/05/2021   High risk medication use 02/05/2021   Dry mouth 02/05/2021   Pain in left foot 01/31/2021   Bunion of great toe of right foot 06/15/2020   Hypoxia 03/05/2019   Derangement of posterior horn of medial meniscus 02/02/2019   Derangement of posterior horn of lateral meniscus 02/02/2019   Meniscal cyst, unspecified laterality 12/01/2018   Torn medial meniscus 12/01/2018   Primary osteoarthritis of left knee 12/01/2018   Coronary artery disease involving native coronary artery of native heart  without angina pectoris 09/14/2018   (HFpEF) heart failure with preserved ejection fraction (HCC)    Chronic pain of left knee 05/15/2018   Mass of left knee 05/15/2018   Non-ST elevation (NSTEMI) myocardial infarction [facility])    Chest pain 01/25/2018   RA (rheumatoid arthritis) (HCC) 01/25/2018   Hyperlipidemia 01/25/2018   Hypokalemia 01/25/2018   Depression with anxiety 01/25/2018   Tobacco abuse 01/25/2018   Lower extremity cellulitis 01/25/2018   Benzodiazepine dependence (HCC)    MDD (major depressive disorder), recurrent severe, without psychosis (HCC) 10/02/2017   CTS (carpal tunnel syndrome) 06/08/2014    Past Medical History:  Diagnosis Date   Allergic rhinitis    Anxiety    Benzodiazepine dependence (HCC)    Bruxism    CAD (coronary artery disease)    S/p NSTEMI 01/2018 >> LHC: oLAD 20, pLAD 20; oD1 20, oLCx 40, pRCA 99 >> PCI: DES to prox RCA // Echo 01/2018: EF 50-55; prob inf-lat and inf HK   Chronic diastolic heart failure    Echocardiogram 07/2018: EF 55-60, grade 1 diastolic dysfunction, normal wall motion, normal GLS (-22.3), PASP 28   Chronic foot pain    bunions, torn ligaments-on chronic pain medication   CTS (carpal tunnel syndrome) 06/08/2014   Depression    High cholesterol    Hypertension    Low blood potassium    MDD (major depressive disorder) 10/02/2017   Migraine    Myocardial infarction (HCC) 02/01/2018   Pneumonia 1986   left lung   RA (rheumatoid arthritis) (HCC)     Past Surgical History:  Procedure Laterality Date   BTL  2000  CARPAL TUNNEL RELEASE Left    CESAREAN SECTION  1978. 1983   CORONARY STENT INTERVENTION N/A 01/27/2018   Procedure: CORONARY STENT INTERVENTION;  Surgeon: Kathleene Hazel, MD;  Location: [facility] INVASIVE CV LAB;  Service: Cardiovascular;  Laterality: N/A;   eye implants     FOOT SURGERY Left 2008   front teeth removed after injury     KNEE ARTHROSCOPY WITH LATERAL MENISECTOMY Left 02/02/2019   Procedure:  LEFT KNEE ARTHROSCOPY, MEDIAL AND LATERAL MENISECTOMY, EXCISION OF LEFT LATERAL MENISCAL CYST;  Surgeon: Valeria Batman, MD;  Location: [facility] ORS;  Service: Orthopedics;  Laterality: Left;   LEFT HEART CATH AND CORONARY ANGIOGRAPHY N/A 01/27/2018   Procedure: LEFT HEART CATH AND CORONARY ANGIOGRAPHY;  Surgeon: Kathleene Hazel, MD;  Location: [facility] INVASIVE CV LAB;  Service: Cardiovascular;  Laterality: N/A;   pre cancerous lesion removed from forehead     TOTAL ABDOMINAL HYSTERECTOMY W/ BILATERAL SALPINGOOPHORECTOMY  2002   Dr. Jackelyn Knife   VAGINAL HYSTERECTOMY  2001    Prior to Admission medications   Medication Sig Start Date End Date Taking? Authorizing Provider  acetaminophen (TYLENOL) 500 MG tablet Take 500 mg by mouth every 6 (six) hours as needed (For knee pain).    [provider]  Cetirizine HCl (ZYRTEC PO) Take 1 capsule by mouth daily.    [provider]  cholecalciferol (VITAMIN D3) 25 MCG (1000 UT) tablet Take 5,000 Units by mouth daily. Patient states she takes 5 a day.    [provider]  clonazePAM (KLONOPIN) 0.5 MG tablet Take 1 tablet (0.5 mg total) by mouth daily at 12 noon. Patient not taking: Reported on 03/19/2023 08/23/22   Berton Mount I, MD  clonazePAM (KLONOPIN) 1 MG tablet Take 1 tablet (1 mg total) by mouth at bedtime. 08/23/22   Berton Mount I, MD  clopidogrel (PLAVIX) 75 MG tablet TAKE 1 TABLET BY MOUTH EVERY DAY Patient taking differently: Take 75 mg by mouth daily. 07/23/22   Pricilla Riffle, MD  escitalopram (LEXAPRO) 10 MG tablet Take 15 mg by mouth every morning. 08/31/22   [provider]  estradiol (VIVELLE-DOT) 0.075 MG/24HR Place 1 patch onto the skin 2 (two) times a week.    [provider]  etanercept (ENBREL SURECLICK) 50 MG/ML injection Inject 50 mg into the skin once a week. 03/19/23   Rice, Jamesetta Orleans, MD  HORIZANT 600 MG TBCR Take 1 tablet by mouth every evening. GABAPENTIN    [provider]  meloxicam (MOBIC) 15 MG tablet Take 15 mg by mouth daily. 03/06/23   [provider]  metoprolol tartrate (LOPRESSOR) 25 MG tablet TAKE 1 TABLET BY MOUTH EVERY DAY 06/04/22   Pricilla Riffle, MD  Multiple Vitamins-Minerals (MULTIVITAMIN WITH MINERALS) tablet Take 1 tablet by mouth daily.    [provider]  nitroGLYCERIN (NITROSTAT) 0.4 MG SL tablet Place 1 tablet (0.4 mg total) under the tongue every 5 (five) minutes as needed for chest pain. 01/28/18   Rai, Ripudeep K, MD  ondansetron (ZOFRAN-ODT) 4 MG disintegrating tablet Take by mouth daily. 10/26/22   [provider]  potassium chloride SA (KLOR-CON M) 20 MEQ tablet Take 2 tablets (40 mEq total) by mouth every Monday, Wednesday, and Friday. 02/28/23   Gaston Islam., NP  rosuvastatin (CRESTOR) 40 MG tablet TAKE 1 TABLET BY MOUTH EVERY DAY 03/11/23   Pricilla Riffle, MD  torsemide (DEMADEX) 20 MG tablet Take 1 tablet (20 mg total) by mouth  every Monday, Wednesday, and Friday. 02/28/23   Gaston Islam., NP  triamcinolone cream (KENALOG) 0.1 %  10/01/22   [provider]  ZUBSOLV 5.7-1.4 MG SUBL Place 1 tablet under the tongue 3 (three) times daily. 09/04/22   [provider]    Allergies  Allergen Reactions   Erythromycin Nausea And Vomiting    stomach upset    Social History   Socioeconomic History   Marital status: Married    Spouse name: Izzabella Crowl   Number of children: 2   Years of education: Ba   Highest education level: Not on file  Occupational History   Occupation: Retired   Tobacco Use   Smoking status: Some Days    Current packs/day: 0.25    Average packs/day: 0.3 packs/day for 53.1 years (13.3 ttl pk-yrs)    Types: Cigarettes    Start date: 1972    Passive exposure: Never   Smokeless tobacco: Never  Vaping Use   Vaping status: Never Used  Substance and Sexual Activity   Alcohol use: No    Alcohol/week: 0.0 standard drinks of alcohol   Drug use: No    Sexual activity: Not on file  Other Topics Concern   Not on file  Social History Narrative   Lives at home with spouse.    Right handed.   Caffeine use: 2 cups coffee/day    8 oz soda/day    Social Drivers of Corporate investment banker Strain: Not on file  Food Insecurity: Not on file  Transportation Needs: Not on file  Physical Activity: Not on file  Stress: Not on file  Social Connections: Unknown (07/09/2021)   Received from [facility], [facility]   Social Network    Social Network: Not on file  Intimate Partner Violence: Unknown (05/31/2021)   Received from [facility], [facility]   HITS    Physically Hurt: Not on file    Insult or Talk Down To: Not on file    Threaten Physical Harm: Not on file    Scream or Curse: Not on file    Tobacco Use: High Risk (03/19/2023)   Patient History    Smoking Tobacco Use: Some Days    Smokeless Tobacco Use: Never    Passive Exposure: Never   Social History   Substance and Sexual Activity  Alcohol Use No   Alcohol/week: 0.0 standard drinks of alcohol    Family History  Problem Relation Age of Onset   Thyroid disease Mother    Breast cancer Mother    Parkinson's disease Father    Raynaud syndrome Maternal Aunt    Arthritis Maternal Grandmother    Heart attack Maternal Grandmother    Stroke Paternal Grandmother    Heart attack Paternal Grandfather     ROS  Objective:  Physical Exam: - Her left knee shows a massive effusion.   - She has a significant valgus deformity.   - Her range of motion is approximately 5 to 110 degrees.   - There is crepitus on range of motion.   - She is tender laterally more than medially with no gross instability. The valgus corrects back to neutral with varus stressing in extension   - Right knee shows no effusion, range of motion 0 to 125 degrees, with no tenderness or instability.   - Gait pattern is significantly antalgic with a walker.    IMAGING:  - Radiographs of both knees  (AP view) and the left  knee (lateral view) demonstrate severe end-stage lateral compartment arthritis of the left knee with approximately a 20 valgus deformity and bone-on-bone changes throughout the joint.  - Radiographs of the right knee demonstrate some mild medial joint space narrowing.  Assessment/Plan:  End stage arthritis, left knee   The patient history, physical examination, clinical judgment of the provider and imaging studies are consistent with end stage degenerative joint disease of the left knee and total knee arthroplasty is deemed medically necessary. The treatment options including medical management, injection therapy arthroscopy and arthroplasty were discussed at length. The risks and benefits of total knee arthroplasty were presented and reviewed. The risks due to aseptic loosening, infection, stiffness, patella tracking problems, thromboembolic complications and other imponderables were discussed. The patient acknowledged the explanation, agreed to proceed with the plan and consent was signed. Patient is being admitted for inpatient treatment for surgery, pain control, PT, OT, prophylactic antibiotics, VTE prophylaxis, progressive ambulation and ADLs and discharge planning. The patient is planning to be discharged home.   Patient's anticipated LOS is less than 2 midnights, meeting these requirements: - Younger than 50 - Lives within 1 hour of care - Has a competent adult at home to recover with post-op recover - NO history of  - Diabetes  - Heart failure  - Stroke  - DVT/VTE  - Cardiac arrhythmia  - Respiratory Failure/COPD  - Renal failure  - Anemia  - Advanced Liver disease  Therapy Plans: EO Disposition: Home with husband and maybe daughter Planned DVT Prophylaxis: Plavix + ASA 325mg   DME Needed: None PCP: Jorge Ny, NP (clearance received) Cardiologist: Dietrich Pates, MD (clearance received) TXA: IV Allergies: NKDA Anesthesia Concerns: None BMI: 28.2 Last  HgbA1c: Not diabetic [facility] [address]   Other: -Hold Plavix 5 days prior to surgery per PCP clearance  - Patient was instructed on what medications to stop prior to surgery. - Follow-up visit in 2 weeks with Dr. Lequita Halt - Begin physical therapy following surgery - Pre-operative lab work as pre-surgical testing - Prescriptions will be provided in hospital at time of discharge  Weston Brass, PA-C Orthopedic Surgery [facility] [place]

## 2023-03-27 ENCOUNTER — Telehealth: Payer: Self-pay | Admitting: Internal Medicine

## 2023-03-27 NOTE — Telephone Encounter (Signed)
Pt called asking if someone can call her back and read her lab results. Pt call back is [phone]

## 2023-03-28 NOTE — Progress Notes (Signed)
Lab results looked good. Her potassium was at the upper end of normal at 5.3. Sedimentation rate was normal at 17. Her white blood cell count was slightly low at 3.5 but not a problem. No new change in medications needed.

## 2023-03-28 NOTE — Telephone Encounter (Signed)
Now addressed in associated result note.

## 2023-03-31 ENCOUNTER — Telehealth: Payer: Self-pay | Admitting: Internal Medicine

## 2023-03-31 DIAGNOSIS — Z79899 Other long term (current) drug therapy: Secondary | ICD-10-CM

## 2023-03-31 DIAGNOSIS — E876 Hypokalemia: Secondary | ICD-10-CM

## 2023-03-31 DIAGNOSIS — I2510 Atherosclerotic heart disease of native coronary artery without angina pectoris: Secondary | ICD-10-CM

## 2023-03-31 DIAGNOSIS — I5032 Chronic diastolic (congestive) heart failure: Secondary | ICD-10-CM

## 2023-03-31 NOTE — Patient Instructions (Signed)
SURGICAL WAITING ROOM VISITATION  Patients having surgery or a procedure may have no more than 2 support people in the waiting area - these visitors may rotate.    Children under the age of 60 must have an adult with them who is not the patient.  Due to an increase in RSV and influenza rates and associated hospitalizations, children ages 48 and under may not visit patients in [facility] hospitals.  Visitors with respiratory illnesses are discouraged from visiting and should remain at home.  If the patient needs to stay at the hospital during part of their recovery, the visitor guidelines for inpatient rooms apply. Pre-op nurse will coordinate an appropriate time for 1 support person to accompany patient in pre-op.  This support person may not rotate.    Please refer to the [facility] website for the visitor guidelines for Inpatients (after your surgery is over and you are in a regular room).       Your procedure is scheduled on:  04/14/2023    Report to [facility] Main Entrance    Report to admitting at   0900AM   Call this number if you have problems the morning of surgery [phone]   Do not eat food :After Midnight.   After Midnight you may have the following liquids until __ 0830____ AM  DAY OF SURGERY  Water Non-Citrus Juices (without pulp, NO RED-Apple, White grape, White cranberry) Black Coffee (NO MILK/CREAM OR CREAMERS, sugar ok)  Clear Tea (NO MILK/CREAM OR CREAMERS, sugar ok) regular and decaf                             Plain Jell-O (NO RED)                                           Fruit ices (not with fruit pulp, NO RED)                                     Popsicles (NO RED)                                                               Sports drinks like Gatorade (NO RED)                  The day of surgery:  Drink ONE (1) Pre-Surgery Clear Ensure or G2 at 0830 AM  ( have completed by ) the morning of surgery. Drink in one sitting. Do not sip.   This drink was given to you during your hospital  pre-op appointment visit. Nothing else to drink after completing the  Pre-Surgery Clear Ensure or G2.          If you have questions, please contact your surgeon's office.      Oral Hygiene is also important to reduce your risk of infection.  Remember - BRUSH YOUR TEETH THE MORNING OF SURGERY WITH YOUR REGULAR TOOTHPASTE  DENTURES WILL BE REMOVED PRIOR TO SURGERY PLEASE DO NOT APPLY "Poly grip" OR ADHESIVES!!!   Do NOT smoke after Midnight   Stop all vitamins and herbal supplements 7 days before surgery.   Take these medicines the morning of surgery with A SIP OF WATER:  zyrtec, lexapro, metoprolol   DO NOT TAKE ANY ORAL DIABETIC MEDICATIONS DAY OF YOUR SURGERY  Bring CPAP mask and tubing day of surgery.                              You may not have any metal on your body including hair pins, jewelry, and body piercing             Do not wear make-up, lotions, powders, perfumes/cologne, or deodorant  Do not wear nail polish including gel and S&S, artificial/acrylic nails, or any other type of covering on natural nails including finger and toenails. If you have artificial nails, gel coating, etc. that needs to be removed by a nail salon please have this removed prior to surgery or surgery may need to be canceled/ delayed if the surgeon/ anesthesia feels like they are unable to be safely monitored.   Do not shave  48 hours prior to surgery.               Men may shave face and neck.   Do not bring valuables to the hospital. [facility] IS NOT             RESPONSIBLE   FOR VALUABLES.   Contacts, glasses, dentures or bridgework may not be worn into surgery.   Bring small overnight bag day of surgery.   DO NOT BRING YOUR HOME MEDICATIONS TO THE HOSPITAL. PHARMACY WILL DISPENSE MEDICATIONS LISTED ON YOUR MEDICATION LIST TO YOU DURING YOUR ADMISSION IN THE HOSPITAL!    Patients discharged on the  day of surgery will not be allowed to drive home.  Someone NEEDS to stay with you for the first 24 hours after anesthesia.   Special Instructions: Bring a copy of your healthcare power of attorney and living will documents the day of surgery if you haven't scanned them before.              Please read over the following fact sheets you were given: IF YOU HAVE QUESTIONS ABOUT YOUR PRE-OP INSTRUCTIONS PLEASE CALL [phone]   If you received a COVID test during your pre-op visit  it is requested that you wear a mask when out in public, stay away from anyone that may not be feeling well and notify your surgeon if you develop symptoms. If you test positive for Covid or have been in contact with anyone that has tested positive in the last 10 days please notify you surgeon.      Pre-operative 5 CHG Bath Instructions   You can play a key role in reducing the risk of infection after surgery. Your skin needs to be as free of germs as possible. You can reduce the number of germs on your skin by washing with CHG (chlorhexidine gluconate) soap before surgery. CHG is an antiseptic soap that kills germs and continues to kill germs even after washing.   DO NOT use if you have an allergy to chlorhexidine/CHG or antibacterial soaps. If your skin becomes reddened or irritated, stop using the CHG and notify one of our  RNs at [phone].   Please shower with the CHG soap starting 4 days before surgery using the following schedule:     Please keep in mind the following:  DO NOT shave, including legs and underarms, starting the day of your first shower.   You may shave your face at any point before/day of surgery.  Place clean sheets on your bed the day you start using CHG soap. Use a clean washcloth (not used since being washed) for each shower. DO NOT sleep with pets once you start using the CHG.   CHG Shower Instructions:  If you choose to wash your hair and private area, wash first with your normal  shampoo/soap.  After you use shampoo/soap, rinse your hair and body thoroughly to remove shampoo/soap residue.  Turn the water OFF and apply about 3 tablespoons (45 ml) of CHG soap to a CLEAN washcloth.  Apply CHG soap ONLY FROM YOUR NECK DOWN TO YOUR TOES (washing for 3-5 minutes)  DO NOT use CHG soap on face, private areas, open wounds, or sores.  Pay special attention to the area where your surgery is being performed.  If you are having back surgery, having someone wash your back for you may be helpful. Wait 2 minutes after CHG soap is applied, then you may rinse off the CHG soap.  Pat dry with a clean towel  Put on clean clothes/pajamas   If you choose to wear lotion, please use ONLY the CHG-compatible lotions on the back of this paper.     Additional instructions for the day of surgery: DO NOT APPLY any lotions, deodorants, cologne, or perfumes.   Put on clean/comfortable clothes.  Brush your teeth.  Ask your nurse before applying any prescription medications to the skin.      CHG Compatible Lotions   Aveeno Moisturizing lotion  Cetaphil Moisturizing Cream  Cetaphil Moisturizing Lotion  Clairol Herbal Essence Moisturizing Lotion, Dry Skin  Clairol Herbal Essence Moisturizing Lotion, Extra Dry Skin  Clairol Herbal Essence Moisturizing Lotion, Normal Skin  Curel Age Defying Therapeutic Moisturizing Lotion with Alpha Hydroxy  Curel Extreme Care Body Lotion  Curel Soothing Hands Moisturizing Hand Lotion  Curel Therapeutic Moisturizing Cream, Fragrance-Free  Curel Therapeutic Moisturizing Lotion, Fragrance-Free  Curel Therapeutic Moisturizing Lotion, Original Formula  Eucerin Daily Replenishing Lotion  Eucerin Dry Skin Therapy Plus Alpha Hydroxy Crme  Eucerin Dry Skin Therapy Plus Alpha Hydroxy Lotion  Eucerin Original Crme  Eucerin Original Lotion  Eucerin Plus Crme Eucerin Plus Lotion  Eucerin TriLipid Replenishing Lotion  Keri Anti-Bacterial Hand Lotion  Keri Deep  Conditioning Original Lotion Dry Skin Formula Softly Scented  Keri Deep Conditioning Original Lotion, Fragrance Free Sensitive Skin Formula  Keri Lotion Fast Absorbing Fragrance Free Sensitive Skin Formula  Keri Lotion Fast Absorbing Softly Scented Dry Skin Formula  Keri Original Lotion  Keri Skin Renewal Lotion Keri Silky Smooth Lotion  Keri Silky Smooth Sensitive Skin Lotion  Nivea Body Creamy Conditioning Oil  Nivea Body Extra Enriched Teacher, adult education Moisturizing Lotion Nivea Crme  Nivea Skin Firming Lotion  NutraDerm 30 Skin Lotion  NutraDerm Skin Lotion  NutraDerm Therapeutic Skin Cream  NutraDerm Therapeutic Skin Lotion  ProShield Protective Hand Cream  Provon moisturizing lotion

## 2023-03-31 NOTE — Progress Notes (Addendum)
 Anesthesia Review:  PCP:  Shirl Agreste Cardiologist : P Ross LOV 10/14/22, ARNETTA Manus Dick,NP 02/27/23 Rheum- DR Rice- LOV 03/19/23  Chest x-ray : EKG : 08/22/22  and 02/27/23 ( preliminary result)  Echo : Stress test: Cardiac Cath :  Activity level: can do a flight of stairs without difficutly  Sleep Study/ CPAP : none  Fasting Blood Sugar :      / Checks Blood Sugar -- times a day:   Blood Thinner/ Instructions /Last Dose: ASA / Instructions/ Last Dose :    Plavix  - stop 5 days prior per pt  03/19/23- cbc with diff and CMP- normal    Pt was 20 minutes late for preop appt.  Accompanied by husband.

## 2023-03-31 NOTE — Telephone Encounter (Signed)
 Left voicemail to return call to office

## 2023-03-31 NOTE — Telephone Encounter (Signed)
Patient sent a MyChart message through pt schedule request;  Pt c/o medication issue:  1. Name of Medication: torsemide and potassium  2. How are you currently taking this medication (dosage and times per day)?   3. Are you having a reaction (difficulty breathing--STAT)? No   4. What is your medication issue?    Comments: Since Robin Searing changed my dosage of torsemide and potassium, I am retaining too much fluid in my legs and abdomen. I would like someone else to evaluate my meds. Thanks, [employer]

## 2023-04-01 ENCOUNTER — Other Ambulatory Visit: Payer: Self-pay

## 2023-04-01 ENCOUNTER — Encounter (HOSPITAL_COMMUNITY)
Admission: RE | Admit: 2023-04-01 | Discharge: 2023-04-01 | Disposition: A | Discharge status: Home / Self Care | Payer: Medicare PPO | Source: Ambulatory Visit | Attending: Orthopedic Surgery | Admitting: Orthopedic Surgery

## 2023-04-01 ENCOUNTER — Encounter (HOSPITAL_COMMUNITY): Payer: Self-pay

## 2023-04-01 DIAGNOSIS — Z01818 Encounter for other preprocedural examination: Secondary | ICD-10-CM

## 2023-04-01 DIAGNOSIS — Z01812 Encounter for preprocedural laboratory examination: Secondary | ICD-10-CM | POA: Insufficient documentation

## 2023-04-01 LAB — SURGICAL PCR SCREEN
MRSA, PCR: NEGATIVE
Staphylococcus aureus: NEGATIVE

## 2023-04-01 NOTE — Telephone Encounter (Signed)
Left a message for the pt to call back.  02/27/23 APP note:  Reduce Torsemide to 20mg  on Monday, Wednesday, and Friday. -Take potassium supplement only on days when Torsemide is taken.

## 2023-04-01 NOTE — Telephone Encounter (Signed)
 On 02/27/23 pt saw Jackee for preop clearance and he changed her Torsemide  to 20 mg Mon, Wed, and Fri with KCL 20 meq.   Since then her last CMET was 03/19/23.... she is having increased edema from her knees down and it is painful to bend at her ankle bilaterally. She is not having any edema in her upper extremities and her breathing has been okay.    She is having knee replacement 03/2723 so she anxious to get the extra fluid off.   I will send to Dr Okey for review.

## 2023-04-01 NOTE — Telephone Encounter (Signed)
 Patient states she was returning call. Please advise  ?

## 2023-04-01 NOTE — Telephone Encounter (Signed)
 Per pt note to us :  Meghan Schwartz said to "1-Take Torsemide  20 mg on Mon Wed Fri. 2- Take potassium 20 mg on Mon Wed and Fri." Saturday and Sunday were not mentioned on medication instructions. My meds bottles said Torsemide  -2 in morning and 1 at night and potassium taken the same way. I am confused! Please have Dr. Okey, Orren or Glendia reply or see me. I have knee replacement surgery Feb 17 with Dr. Melodi.

## 2023-04-01 NOTE — Telephone Encounter (Signed)
Patient returned RN Ann's call.

## 2023-04-02 ENCOUNTER — Other Ambulatory Visit (HOSPITAL_COMMUNITY): Payer: Self-pay

## 2023-04-02 MED ORDER — TORSEMIDE 20 MG PO TABS: 20.0000 mg | ORAL_TABLET | Freq: Every day | ORAL | Status: DC

## 2023-04-02 MED ORDER — POTASSIUM CHLORIDE CRYS ER 20 MEQ PO TBCR: 20.0000 meq | EXTENDED_RELEASE_TABLET | Freq: Every day | ORAL | Status: DC

## 2023-04-02 MED ORDER — POTASSIUM CHLORIDE CRYS ER 20 MEQ PO TBCR
40.0000 meq | EXTENDED_RELEASE_TABLET | Freq: Every day | ORAL | 3 refills | Status: DC
  Filled 2023-04-02: qty 90, 45d supply, fill #0

## 2023-04-02 NOTE — Telephone Encounter (Signed)
 I advised the pt and she will have labs 04/10/23 since she is having surgery 04/14/23.

## 2023-04-09 DIAGNOSIS — F1121 Opioid dependence, in remission: Secondary | ICD-10-CM | POA: Diagnosis not present

## 2023-04-09 DIAGNOSIS — F411 Generalized anxiety disorder: Secondary | ICD-10-CM | POA: Diagnosis not present

## 2023-04-09 DIAGNOSIS — F1320 Sedative, hypnotic or anxiolytic dependence, uncomplicated: Secondary | ICD-10-CM | POA: Diagnosis not present

## 2023-04-09 DIAGNOSIS — F332 Major depressive disorder, recurrent severe without psychotic features: Secondary | ICD-10-CM | POA: Diagnosis not present

## 2023-04-10 ENCOUNTER — Encounter (HOSPITAL_COMMUNITY): Payer: Self-pay

## 2023-04-10 NOTE — Progress Notes (Addendum)
 Case: [id number] Date/Time: 04/14/23 1120   Procedure: TOTAL KNEE ARTHROPLASTY (Left: Knee)   Anesthesia type: Choice   Pre-op diagnosis: left knee osteoarthritis   Location: [facility] ROOM 09 / [facility]   Surgeons: Melodi Lerner, MD       DISCUSSION: Meghan Schwartz is a 73 year old female who presents to PAT prior to surgery above.  Past medical history significant for current smoking, hypertension, history of NSTEMI and CAD s/p PCI in 2019, chronic diastolic CHF, emphysema (seen on CT scan), nocturnal hypoxia, RA, anxiety, depression, benzo dependence, visual impairment  Patient follows with cardiology due to history of NSTEMI in 2019.  She is status post PCI and has chronic diastolic heart failure.  Last seen in clinic on 02/27/2023 for preop clearance.  She reported urinary frequency with torsemide  and was euvolemic so was advised to decrease medication to Monday, Wednesday, Friday.  She was cleared for surgery:  Preoperative clearance: Patient's RCRI score is 11% The patient affirms she has been doing well without any new cardiac symptoms. They are able to achieve 5 METS without cardiac limitations. Therefore, based on ACC/AHA guidelines, the patient would be at acceptable risk for the planned procedure without further cardiovascular testing. The patient was advised that if she develops new symptoms prior to surgery to contact our office to arrange for a follow-up visit, and she verbalized understanding.    -Patient can hold Plavix  5 days prior to procedure and should restart postprocedure when surgically safe and hemostasis is achieved.  After her office visit she called back reporting increased lower extremity edema but no shortness of breath.  She was advised to increase her diuretic back to daily.  Patient does have issues with hypokalemia when taking a diuretic and is on supplementation. Discussed with patient on 2/13 and she states he LE edema has improved and she denies SOB.  LD Plavix  -  2/11  VS:  Wt Readings from Last 3 Encounters:  03/19/23 73.5 kg  02/27/23 70.3 kg  12/18/22 73 kg   Temp Readings from Last 3 Encounters:  08/23/22 36.4 C (Oral)  12/25/21 36.4 C (Oral)  02/28/21 36.9 C (Oral)   BP Readings from Last 3 Encounters:  03/19/23 130/77  02/27/23 108/64  02/03/23 114/68   Pulse Readings from Last 3 Encounters:  03/19/23 (!) 58  02/27/23 75  02/03/23 79     PROVIDERS: Chet Mad, DO Cardiology: Vina Gull, MD Rheum- DR Rice  LABS: Labs reviewed: Acceptable for surgery. (all labs ordered are listed, but only abnormal results are displayed)  Labs Reviewed  SURGICAL PCR SCREEN     IMAGES:  CTA chest 08/20/2022:  IMPRESSION: 1. No acute chest CT or CTA findings. 2. Aortic and coronary artery atherosclerosis. 3. Chronic bronchial thickening and emphysematous change. 4. Cholelithiasis, with a distended but otherwise unremarkable visualized gallbladder. 5. Stable 1.2 cm rim calcified splenic artery aneurysm. 6. Stable 5 mm right middle lobe nodule.   Aortic Atherosclerosis (ICD10-I70.0) and Emphysema (ICD10-J43.9).  EKG 02/27/2023  Normal sinus rhythm, rate 73 Low voltage QRS  CV:  Echo 05/09/2022:  IMPRESSIONS    1. Left ventricular ejection fraction, by estimation, is 55 to 60%. The left ventricle has normal function. The left ventricle has no regional wall motion abnormalities. Left ventricular diastolic parameters are consistent with Grade I diastolic dysfunction (impaired relaxation).  2. Right ventricular systolic function is normal. The right ventricular size is normal.  3. The mitral valve is normal in structure. No evidence of mitral valve regurgitation.  No evidence of mitral stenosis.  4. The aortic valve is normal in structure. Aortic valve regurgitation is not visualized. No aortic stenosis is present.  5. The inferior vena cava is normal in size with greater than 50% respiratory variability,  suggesting right atrial pressure of 3 mmHg.  Left heart cath 02/23/2018:  Prox RCA to Mid RCA lesion is 99% stenosed. Ost LAD to Prox LAD lesion is 20% stenosed. Prox LAD lesion is 20% stenosed. Ost Cx to Prox Cx lesion is 40% stenosed. Ost 1st Diag lesion is 20% stenosed. A drug-eluting stent was successfully placed using a STENT SYNERGY DES 3.5X28. Post intervention, there is a 0% residual stenosis.   1. Severe, ulcerated stenosis mid RCA 2. Mild non-obstructive disease in the LAD and Circumflex 3. Successful PTCA/DES x mid RCA   Recommendations: DAPT with ASA and Brilinta  for one year. Continue statin. Will start a beta blocker. Smoking cessation. Likely d/c home in the am if stable.     Past Medical History:  Diagnosis Date   Allergic rhinitis    Anxiety    Benzodiazepine dependence (HCC)    Bruxism    CAD (coronary artery disease)    S/p NSTEMI 01/2018 >> LHC: oLAD 20, pLAD 20; oD1 20, oLCx 40, pRCA 99 >> PCI: DES to prox RCA // Echo 01/2018: EF 50-55; prob inf-lat and inf HK   Chronic diastolic heart failure    Echocardiogram 07/2018: EF 55-60, grade 1 diastolic dysfunction, normal wall motion, normal GLS (-22.3), PASP 28   Chronic foot pain    bunions, torn ligaments-on chronic pain medication   CTS (carpal tunnel syndrome) 06/08/2014   Depression    High cholesterol    Hypertension    Low blood potassium    MDD (major depressive disorder) 10/02/2017   Myocardial infarction (HCC) 02/01/2018   Pneumonia 1986   left lung   RA (rheumatoid arthritis) (HCC)     Past Surgical History:  Procedure Laterality Date   BTL  2000   CARPAL TUNNEL RELEASE Left    CESAREAN SECTION  1978. 1983   CORONARY STENT INTERVENTION N/A 01/27/2018   Procedure: CORONARY STENT INTERVENTION;  Surgeon: Verlin Lonni BIRCH, MD;  Location: [facility];  Service: Cardiovascular;  Laterality: N/A;   eye implants     FOOT SURGERY Left 2008   front teeth removed after injury     KNEE  ARTHROSCOPY WITH LATERAL MENISECTOMY Left 02/02/2019   Procedure: LEFT KNEE ARTHROSCOPY, MEDIAL AND LATERAL MENISECTOMY, EXCISION OF LEFT LATERAL MENISCAL CYST;  Surgeon: Anderson Maude ORN, MD;  Location: [facility];  Service: Orthopedics;  Laterality: Left;   LEFT HEART CATH AND CORONARY ANGIOGRAPHY N/A 01/27/2018   Procedure: LEFT HEART CATH AND CORONARY ANGIOGRAPHY;  Surgeon: Verlin Lonni BIRCH, MD;  Location: [facility];  Service: Cardiovascular;  Laterality: N/A;   pre cancerous lesion removed from forehead     TOTAL ABDOMINAL HYSTERECTOMY W/ BILATERAL SALPINGOOPHORECTOMY  2002   Dr. Horacio   VAGINAL HYSTERECTOMY  2001    MEDICATIONS:  acetaminophen  (TYLENOL ) 500 MG tablet   Carboxymethylcellulose Sodium (THERATEARS) 0.25 % SOLN   cetirizine (ZYRTEC) 10 MG tablet   Cholecalciferol (VITAMIN D) 50 MCG (2000 UT) tablet   clonazePAM  (KLONOPIN ) 1 MG tablet   clopidogrel  (PLAVIX ) 75 MG tablet   docusate sodium  ([employer] STOOL SOFTENER) 100 MG capsule   escitalopram  (LEXAPRO ) 10 MG tablet   estradiol  (VIVELLE -DOT) 0.075 MG/24HR   etanercept  (ENBREL  SURECLICK) 50 MG/ML injection  HORIZANT  600 MG TBCR   meloxicam (MOBIC) 15 MG tablet   metoprolol  tartrate (LOPRESSOR ) 25 MG tablet   Multiple Vitamins-Minerals (MULTIVITAMIN WITH MINERALS) tablet   nitroGLYCERIN  (NITROSTAT ) 0.4 MG SL tablet   ondansetron  (ZOFRAN -ODT) 4 MG disintegrating tablet   potassium chloride  SA (KLOR-CON  M) 20 MEQ tablet   Probiotic Product (ALIGN) 4 MG CAPS   rosuvastatin  (CRESTOR ) 40 MG tablet   torsemide  (DEMADEX ) 20 MG tablet   triamcinolone  cream (KENALOG ) 0.1 %   ZUBSOLV  5.7-1.4 MG SUBL   No current facility-administered medications for this encounter.   Burnard CHRISTELLA Odis DEVONNA [facility] Short Stay/Anesthesiology [facility] Phone [phone] 04/10/2023 8:58 AM

## 2023-04-10 NOTE — Anesthesia Preprocedure Evaluation (Addendum)
Anesthesia Evaluation  Patient identified by MRN, date of birth, ID band Patient awake    Reviewed: Allergy & Precautions, NPO status , Patient's Chart, lab work & pertinent test results  Airway Mallampati: I  TM Distance: >3 FB Neck ROM: Full    Dental  (+) Dental Advisory Given, Upper Dentures   Pulmonary Current Smoker   Pulmonary exam normal        Cardiovascular hypertension, + CAD, + Past MI, + Cardiac Stents and +CHF   Rhythm:Regular Rate:Normal      ECHOCARDIOGRAM REPORT       Patient Name:   Meghan Schwartz  Date of Exam: 05/09/2022 Medical Rec #:  [id number]     Height:       64.0 in Accession #:    [id number]    Weight:       158.0 lb Date of Birth:  11-16-1950     BSA:          1.770 m Patient Age:    73 years      BP:           91/84 mmHg Patient Gender: F             HR:           74 bpm. Exam Location:  [address]  Procedure: 2D Echo, Cardiac Doppler and Color Doppler  Indications:    R60.9 Edema   History:        Patient has prior history of Echocardiogram examinations, most                 recent 09/21/2020. CAD and Previous Myocardial Infarction; Risk                 Factors:Hypertension and Dyslipidemia. Rheumatoid Arthritis.   Sonographer:    Daphine Deutscher RDCS Referring Phys: [id number] PAULA V ROSS  IMPRESSIONS    1. Left ventricular ejection fraction, by estimation, is 55 to 60%. The left ventricle has normal function. The left ventricle has no regional wall motion abnormalities. Left ventricular diastolic parameters are consistent with Grade I diastolic dysfunction (impaired relaxation).  2. Right ventricular systolic function is normal. The right ventricular size is normal.  3. The mitral valve is normal in structure. No evidence of mitral valve regurgitation. No evidence of mitral stenosis.  4. The aortic valve is normal in structure. Aortic valve regurgitation is not visualized. No  aortic stenosis is present.  5. The inferior vena cava is normal in size with greater than 50% respiratory variability, suggesting right atrial pressure of 3 mmHg.     Neuro/Psych  Headaches PSYCHIATRIC DISORDERS Anxiety Depression       GI/Hepatic negative GI ROS, Neg liver ROS,,,  Endo/Other  negative endocrine ROS    Renal/GU negative Renal ROS     Musculoskeletal  (+) Arthritis , Osteoarthritis,    Abdominal Normal abdominal exam  (+)   Peds  Hematology negative hematology ROS (+)   Anesthesia Other Findings   Reproductive/Obstetrics                              Echo: 1. The left ventricle has normal systolic function, with an ejection fraction of 55-60%. The cavity size was normal. Left ventricular diastolic Doppler parameters are consistent with impaired relaxation. No evidence of left ventricular regional wall  motion abnormalities. GLS -22.3%, nromal.  2. The right ventricle has normal systolic function. The cavity  was normal. There is no increase in right ventricular wall thickness.  3. No evidence of mitral valve stenosis. No significant mitral regurgitation.  4. The aortic valve is tricuspid. No stenosis of the aortic valve.  5. The aortic root is normal in size and structure.  6. Normal IVC size. PA systolic pressure 28 mmHg.  Anesthesia Physical Anesthesia Plan  ASA: 3  Anesthesia Plan: Spinal   Post-op Pain Management: Regional block* and Tylenol PO (pre-op)*   Induction:   PONV Risk Score and Plan: 2 and Ondansetron, Dexamethasone and Propofol infusion  Airway Management Planned: Natural Airway and Simple Face Mask  Additional Equipment: None  Intra-op Plan:   Post-operative Plan:   Informed Consent: I have reviewed the patients History and Physical, chart, labs and discussed the procedure including the risks, benefits and alternatives for the proposed anesthesia with the patient or authorized representative who has  indicated his/her understanding and acceptance.     Dental advisory given  Plan Discussed with: CRNA  Anesthesia Plan Comments: (See PAT note from 2/4 by K Gekas PA-C. Discussed risk/benefits of spinal vs GA with patient. She is leaning towards spinal but will discuss with anesthesiologist DOS   Last dose of plavix 2/12)         Anesthesia Quick Evaluation

## 2023-04-14 ENCOUNTER — Encounter (HOSPITAL_COMMUNITY): Payer: Self-pay | Admitting: Orthopedic Surgery

## 2023-04-14 ENCOUNTER — Other Ambulatory Visit: Payer: Self-pay

## 2023-04-14 ENCOUNTER — Ambulatory Visit (HOSPITAL_COMMUNITY): Payer: Medicare PPO | Admitting: Certified Registered"

## 2023-04-14 ENCOUNTER — Ambulatory Visit (HOSPITAL_COMMUNITY): Payer: Medicare PPO | Admitting: Medical

## 2023-04-14 ENCOUNTER — Encounter (HOSPITAL_COMMUNITY)
Admission: RE | Disposition: A | Discharge status: Home / Self Care | Payer: Self-pay | Source: Ambulatory Visit | Attending: Orthopedic Surgery

## 2023-04-14 ENCOUNTER — Observation Stay (HOSPITAL_COMMUNITY)
Admission: RE | Admit: 2023-04-14 | Discharge: 2023-04-16 | Disposition: A | Discharge status: Home / Self Care | Payer: Medicare PPO | Source: Ambulatory Visit | Attending: Orthopedic Surgery | Admitting: Orthopedic Surgery

## 2023-04-14 ENCOUNTER — Ambulatory Visit: Payer: Medicare PPO | Admitting: Physician Assistant

## 2023-04-14 DIAGNOSIS — Z79899 Other long term (current) drug therapy: Secondary | ICD-10-CM | POA: Diagnosis not present

## 2023-04-14 DIAGNOSIS — I5032 Chronic diastolic (congestive) heart failure: Secondary | ICD-10-CM | POA: Insufficient documentation

## 2023-04-14 DIAGNOSIS — Z7902 Long term (current) use of antithrombotics/antiplatelets: Secondary | ICD-10-CM | POA: Insufficient documentation

## 2023-04-14 DIAGNOSIS — Z01818 Encounter for other preprocedural examination: Secondary | ICD-10-CM

## 2023-04-14 DIAGNOSIS — I509 Heart failure, unspecified: Secondary | ICD-10-CM

## 2023-04-14 DIAGNOSIS — I110 Hypertensive heart disease with heart failure: Secondary | ICD-10-CM | POA: Insufficient documentation

## 2023-04-14 DIAGNOSIS — F17210 Nicotine dependence, cigarettes, uncomplicated: Secondary | ICD-10-CM | POA: Insufficient documentation

## 2023-04-14 DIAGNOSIS — Z955 Presence of coronary angioplasty implant and graft: Secondary | ICD-10-CM | POA: Diagnosis not present

## 2023-04-14 DIAGNOSIS — I2510 Atherosclerotic heart disease of native coronary artery without angina pectoris: Secondary | ICD-10-CM | POA: Diagnosis not present

## 2023-04-14 DIAGNOSIS — M1712 Unilateral primary osteoarthritis, left knee: Secondary | ICD-10-CM | POA: Diagnosis not present

## 2023-04-14 DIAGNOSIS — M179 Osteoarthritis of knee, unspecified: Principal | ICD-10-CM

## 2023-04-14 DIAGNOSIS — G8918 Other acute postprocedural pain: Secondary | ICD-10-CM | POA: Diagnosis not present

## 2023-04-14 HISTORY — PX: TOTAL KNEE ARTHROPLASTY: SHX125

## 2023-04-14 SURGERY — ARTHROPLASTY, KNEE, TOTAL
Anesthesia: Spinal | Site: Knee | Laterality: Left

## 2023-04-14 MED ORDER — OXYCODONE HCL 5 MG PO TABS: 5.0000 mg | ORAL_TABLET | Freq: Once | ORAL | Status: DC | PRN

## 2023-04-14 MED ORDER — CLONAZEPAM 1 MG PO TABS
1.0000 mg | ORAL_TABLET | Freq: Every day | ORAL | Status: DC
  Administered 2023-04-14 – 2023-04-15 (×2): 1 mg via ORAL
  Filled 2023-04-14 (×2): qty 1

## 2023-04-14 MED ORDER — TRANEXAMIC ACID-NACL 1000-0.7 MG/100ML-% IV SOLN
1000.0000 mg | INTRAVENOUS | Status: AC
  Administered 2023-04-14: 1000 mg via INTRAVENOUS
  Filled 2023-04-14: qty 100

## 2023-04-14 MED ORDER — SODIUM CHLORIDE (PF) 0.9 % IJ SOLN
INTRAMUSCULAR | Status: AC
Start: 2023-04-14 — End: ?
  Filled 2023-04-14: qty 10

## 2023-04-14 MED ORDER — METOCLOPRAMIDE HCL 5 MG PO TABS: 5.0000 mg | ORAL_TABLET | Freq: Three times a day (TID) | ORAL | Status: DC | PRN

## 2023-04-14 MED ORDER — POTASSIUM CHLORIDE CRYS ER 20 MEQ PO TBCR
20.0000 meq | EXTENDED_RELEASE_TABLET | Freq: Every day | ORAL | Status: DC
  Administered 2023-04-14 – 2023-04-15 (×2): 20 meq via ORAL
  Filled 2023-04-14 (×2): qty 1

## 2023-04-14 MED ORDER — DEXAMETHASONE SODIUM PHOSPHATE 10 MG/ML IJ SOLN
10.0000 mg | Freq: Once | INTRAMUSCULAR | Status: AC
  Administered 2023-04-15: 10 mg via INTRAVENOUS
  Filled 2023-04-14: qty 1

## 2023-04-14 MED ORDER — ESCITALOPRAM OXALATE 10 MG PO TABS
15.0000 mg | ORAL_TABLET | Freq: Every morning | ORAL | Status: DC
  Administered 2023-04-15 – 2023-04-16 (×2): 15 mg via ORAL
  Filled 2023-04-14 (×2): qty 2

## 2023-04-14 MED ORDER — LACTATED RINGERS IV SOLN: INTRAVENOUS | Status: DC

## 2023-04-14 MED ORDER — FENTANYL CITRATE PF 50 MCG/ML IJ SOSY: 25.0000 ug | PREFILLED_SYRINGE | INTRAMUSCULAR | Status: DC | PRN

## 2023-04-14 MED ORDER — BUPIVACAINE LIPOSOME 1.3 % IJ SUSP
INTRAMUSCULAR | Status: AC
  Filled 2023-04-14: qty 20

## 2023-04-14 MED ORDER — OXYCODONE HCL 5 MG PO TABS
5.0000 mg | ORAL_TABLET | ORAL | Status: DC | PRN
  Administered 2023-04-14: 10 mg via ORAL
  Filled 2023-04-14: qty 2
  Filled 2023-04-14: qty 1

## 2023-04-14 MED ORDER — ONDANSETRON HCL 4 MG/2ML IJ SOLN
INTRAMUSCULAR | Status: DC | PRN
  Administered 2023-04-14: 4 mg via INTRAVENOUS

## 2023-04-14 MED ORDER — BUPIVACAINE IN DEXTROSE 0.75-8.25 % IT SOLN
INTRATHECAL | Status: DC | PRN
  Administered 2023-04-14: 1.6 mL via INTRATHECAL

## 2023-04-14 MED ORDER — FENTANYL CITRATE PF 50 MCG/ML IJ SOSY
50.0000 ug | PREFILLED_SYRINGE | INTRAMUSCULAR | Status: DC
  Administered 2023-04-14: 50 ug via INTRAVENOUS
  Filled 2023-04-14: qty 2

## 2023-04-14 MED ORDER — ROSUVASTATIN CALCIUM 20 MG PO TABS
40.0000 mg | ORAL_TABLET | Freq: Every day | ORAL | Status: DC
  Administered 2023-04-15 – 2023-04-16 (×2): 40 mg via ORAL
  Filled 2023-04-14 (×2): qty 2

## 2023-04-14 MED ORDER — BUPIVACAINE LIPOSOME 1.3 % IJ SUSP
INTRAMUSCULAR | Status: DC | PRN
  Administered 2023-04-14: 20 mL

## 2023-04-14 MED ORDER — SODIUM CHLORIDE (PF) 0.9 % IJ SOLN
INTRAMUSCULAR | Status: AC
  Filled 2023-04-14: qty 50

## 2023-04-14 MED ORDER — ACETAMINOPHEN 10 MG/ML IV SOLN: 1000.0000 mg | Freq: Once | INTRAVENOUS | Status: DC | PRN

## 2023-04-14 MED ORDER — CHLORHEXIDINE GLUCONATE 0.12 % MT SOLN
15.0000 mL | Freq: Once | OROMUCOSAL | Status: AC
Start: 2023-04-14 — End: 2023-04-14
  Administered 2023-04-14: 15 mL via OROMUCOSAL

## 2023-04-14 MED ORDER — ROPIVACAINE HCL 5 MG/ML IJ SOLN
INTRAMUSCULAR | Status: DC | PRN
  Administered 2023-04-14: 20 mL via PERINEURAL

## 2023-04-14 MED ORDER — 0.9 % SODIUM CHLORIDE (POUR BTL) OPTIME
TOPICAL | Status: DC | PRN
  Administered 2023-04-14: 1000 mL

## 2023-04-14 MED ORDER — FLEET ENEMA RE ENEM: 1.0000 | ENEMA | Freq: Once | RECTAL | Status: DC | PRN

## 2023-04-14 MED ORDER — OXYCODONE HCL 5 MG/5ML PO SOLN: 5.0000 mg | Freq: Once | ORAL | Status: DC | PRN

## 2023-04-14 MED ORDER — METHOCARBAMOL 500 MG PO TABS
500.0000 mg | ORAL_TABLET | Freq: Four times a day (QID) | ORAL | Status: DC | PRN
  Administered 2023-04-14 – 2023-04-16 (×6): 500 mg via ORAL
  Filled 2023-04-14 (×7): qty 1

## 2023-04-14 MED ORDER — BUPRENORPHINE HCL-NALOXONE HCL 8-2 MG SL SUBL
1.0000 | SUBLINGUAL_TABLET | Freq: Every day | SUBLINGUAL | Status: DC
  Administered 2023-04-14: 1 via SUBLINGUAL
  Filled 2023-04-14 (×2): qty 1

## 2023-04-14 MED ORDER — PROPOFOL 500 MG/50ML IV EMUL
INTRAVENOUS | Status: DC | PRN
  Administered 2023-04-14: 70 ug/kg/min via INTRAVENOUS

## 2023-04-14 MED ORDER — GABAPENTIN 300 MG PO CAPS
600.0000 mg | ORAL_CAPSULE | Freq: Every evening | ORAL | Status: DC
  Administered 2023-04-14 – 2023-04-15 (×2): 600 mg via ORAL
  Filled 2023-04-14 (×2): qty 2

## 2023-04-14 MED ORDER — TORSEMIDE 20 MG PO TABS
20.0000 mg | ORAL_TABLET | Freq: Every day | ORAL | Status: DC
  Administered 2023-04-15: 20 mg via ORAL
  Filled 2023-04-14: qty 1

## 2023-04-14 MED ORDER — MENTHOL 3 MG MT LOZG: 1.0000 | LOZENGE | OROMUCOSAL | Status: DC | PRN

## 2023-04-14 MED ORDER — METHOCARBAMOL 1000 MG/10ML IJ SOLN: 500.0000 mg | Freq: Four times a day (QID) | INTRAMUSCULAR | Status: DC | PRN

## 2023-04-14 MED ORDER — HYDROMORPHONE HCL 1 MG/ML IJ SOLN
0.5000 mg | INTRAMUSCULAR | Status: DC | PRN
  Administered 2023-04-14: 1 mg via INTRAVENOUS
  Filled 2023-04-14: qty 1

## 2023-04-14 MED ORDER — POLYVINYL ALCOHOL 1.4 % OP SOLN
2.0000 [drp] | Freq: Every day | OPHTHALMIC | Status: DC
  Administered 2023-04-14: 2 [drp] via OPHTHALMIC
  Filled 2023-04-14 (×2): qty 15

## 2023-04-14 MED ORDER — CEFAZOLIN SODIUM-DEXTROSE 2-4 GM/100ML-% IV SOLN
2.0000 g | Freq: Four times a day (QID) | INTRAVENOUS | Status: AC
  Administered 2023-04-14 (×2): 2 g via INTRAVENOUS
  Filled 2023-04-14 (×2): qty 100

## 2023-04-14 MED ORDER — CEFAZOLIN SODIUM-DEXTROSE 2-4 GM/100ML-% IV SOLN
2.0000 g | INTRAVENOUS | Status: AC
  Administered 2023-04-14: 2 g via INTRAVENOUS
  Filled 2023-04-14: qty 100

## 2023-04-14 MED ORDER — BUPIVACAINE LIPOSOME 1.3 % IJ SUSP: 20.0000 mL | Freq: Once | INTRAMUSCULAR | Status: DC

## 2023-04-14 MED ORDER — OXYCODONE HCL 5 MG PO TABS
10.0000 mg | ORAL_TABLET | ORAL | Status: DC | PRN
  Administered 2023-04-14: 10 mg via ORAL
  Administered 2023-04-14 – 2023-04-15 (×4): 15 mg via ORAL
  Filled 2023-04-14 (×5): qty 3

## 2023-04-14 MED ORDER — DOCUSATE SODIUM 100 MG PO CAPS
100.0000 mg | ORAL_CAPSULE | Freq: Two times a day (BID) | ORAL | Status: DC
  Administered 2023-04-14 – 2023-04-16 (×5): 100 mg via ORAL
  Filled 2023-04-14 (×5): qty 1

## 2023-04-14 MED ORDER — POLYETHYLENE GLYCOL 3350 17 G PO PACK
17.0000 g | PACK | Freq: Every day | ORAL | Status: DC | PRN
  Administered 2023-04-15: 17 g via ORAL
  Filled 2023-04-14: qty 1

## 2023-04-14 MED ORDER — CLOPIDOGREL BISULFATE 75 MG PO TABS
75.0000 mg | ORAL_TABLET | Freq: Every day | ORAL | Status: DC
  Administered 2023-04-15 – 2023-04-16 (×2): 75 mg via ORAL
  Filled 2023-04-14 (×2): qty 1

## 2023-04-14 MED ORDER — ASPIRIN 325 MG PO TBEC
325.0000 mg | DELAYED_RELEASE_TABLET | Freq: Every day | ORAL | Status: DC
  Administered 2023-04-15 – 2023-04-16 (×2): 325 mg via ORAL
  Filled 2023-04-14 (×2): qty 1

## 2023-04-14 MED ORDER — METOPROLOL TARTRATE 25 MG PO TABS
25.0000 mg | ORAL_TABLET | Freq: Every day | ORAL | Status: DC
  Administered 2023-04-15 – 2023-04-16 (×2): 25 mg via ORAL
  Filled 2023-04-14 (×2): qty 1

## 2023-04-14 MED ORDER — ACETAMINOPHEN 10 MG/ML IV SOLN
1000.0000 mg | Freq: Once | INTRAVENOUS | Status: AC
  Administered 2023-04-14: 1000 mg via INTRAVENOUS
  Filled 2023-04-14: qty 100

## 2023-04-14 MED ORDER — PHENYLEPHRINE 80 MCG/ML (10ML) SYRINGE FOR IV PUSH (FOR BLOOD PRESSURE SUPPORT)
PREFILLED_SYRINGE | INTRAVENOUS | Status: DC | PRN
  Administered 2023-04-14: 100 ug via INTRAVENOUS

## 2023-04-14 MED ORDER — SODIUM CHLORIDE 0.9 % IV SOLN: INTRAVENOUS | Status: DC

## 2023-04-14 MED ORDER — ORAL CARE MOUTH RINSE: 15.0000 mL | Freq: Once | OROMUCOSAL | Status: AC

## 2023-04-14 MED ORDER — POVIDONE-IODINE 10 % EX SWAB: 2.0000 | Freq: Once | CUTANEOUS | Status: DC

## 2023-04-14 MED ORDER — ONDANSETRON HCL 4 MG PO TABS: 4.0000 mg | ORAL_TABLET | Freq: Four times a day (QID) | ORAL | Status: DC | PRN

## 2023-04-14 MED ORDER — PHENYLEPHRINE HCL-NACL 20-0.9 MG/250ML-% IV SOLN
INTRAVENOUS | Status: DC | PRN
  Administered 2023-04-14: 30 ug/min via INTRAVENOUS

## 2023-04-14 MED ORDER — LORATADINE 10 MG PO TABS
10.0000 mg | ORAL_TABLET | Freq: Every day | ORAL | Status: DC
  Administered 2023-04-14 – 2023-04-16 (×3): 10 mg via ORAL
  Filled 2023-04-14 (×3): qty 1

## 2023-04-14 MED ORDER — CLONAZEPAM 0.5 MG PO TABS
0.5000 mg | ORAL_TABLET | ORAL | Status: DC
Start: 2023-04-14 — End: 2023-04-14

## 2023-04-14 MED ORDER — ONDANSETRON HCL 4 MG/2ML IJ SOLN: 4.0000 mg | Freq: Four times a day (QID) | INTRAMUSCULAR | Status: DC | PRN

## 2023-04-14 MED ORDER — STERILE WATER FOR IRRIGATION IR SOLN
Status: DC | PRN
  Administered 2023-04-14: 1000 mL

## 2023-04-14 MED ORDER — GLYCOPYRROLATE PF 0.2 MG/ML IJ SOSY
PREFILLED_SYRINGE | INTRAMUSCULAR | Status: DC | PRN
  Administered 2023-04-14: .2 mg via INTRAVENOUS

## 2023-04-14 MED ORDER — ACETAMINOPHEN 500 MG PO TABS
1000.0000 mg | ORAL_TABLET | Freq: Four times a day (QID) | ORAL | Status: AC
  Administered 2023-04-14 – 2023-04-15 (×4): 1000 mg via ORAL
  Filled 2023-04-14 (×4): qty 2

## 2023-04-14 MED ORDER — DEXAMETHASONE SODIUM PHOSPHATE 10 MG/ML IJ SOLN
8.0000 mg | Freq: Once | INTRAMUSCULAR | Status: AC
  Administered 2023-04-14: 4 mg via INTRAVENOUS
  Administered 2023-04-14: 8 mg via INTRAVENOUS

## 2023-04-14 MED ORDER — SODIUM CHLORIDE 0.9 % IR SOLN
Status: DC | PRN
  Administered 2023-04-14: 1000 mL

## 2023-04-14 MED ORDER — HYDROMORPHONE HCL 1 MG/ML IJ SOLN
1.0000 mg | INTRAMUSCULAR | Status: DC | PRN
  Administered 2023-04-14 – 2023-04-16 (×6): 1 mg via INTRAVENOUS
  Filled 2023-04-14 (×7): qty 1

## 2023-04-14 MED ORDER — METOCLOPRAMIDE HCL 5 MG/ML IJ SOLN: 5.0000 mg | Freq: Three times a day (TID) | INTRAMUSCULAR | Status: DC | PRN

## 2023-04-14 MED ORDER — SODIUM CHLORIDE (PF) 0.9 % IJ SOLN
INTRAMUSCULAR | Status: DC | PRN
  Administered 2023-04-14: 60 mL

## 2023-04-14 MED ORDER — BISACODYL 10 MG RE SUPP: 10.0000 mg | Freq: Every day | RECTAL | Status: DC | PRN

## 2023-04-14 MED ORDER — DIPHENHYDRAMINE HCL 12.5 MG/5ML PO ELIX: 12.5000 mg | ORAL_SOLUTION | ORAL | Status: DC | PRN

## 2023-04-14 MED ORDER — MIDAZOLAM HCL 2 MG/2ML IJ SOLN: 1.0000 mg | INTRAMUSCULAR | Status: DC

## 2023-04-14 MED ORDER — PHENOL 1.4 % MT LIQD: 1.0000 | OROMUCOSAL | Status: DC | PRN

## 2023-04-14 MED ORDER — ACETAMINOPHEN 325 MG PO TABS
325.0000 mg | ORAL_TABLET | Freq: Four times a day (QID) | ORAL | Status: DC | PRN
  Administered 2023-04-15 – 2023-04-16 (×2): 650 mg via ORAL
  Filled 2023-04-14 (×2): qty 2

## 2023-04-14 SURGICAL SUPPLY — 45 items
ATTUNE MED DOME PAT 38 KNEE (Knees) IMPLANT
ATTUNE PS FEM LT SZ 4 CEM KNEE (Femur) IMPLANT
ATTUNE PSRP INSR SZ4 10 KNEE (Insert) IMPLANT
BAG COUNTER SPONGE SURGICOUNT (BAG) IMPLANT
BAG ZIPLOCK 12X15 (MISCELLANEOUS) ×1 IMPLANT
BASE TIBIAL ROT PLAT SZ 5 KNEE (Knees) IMPLANT
BLADE SAG 18X100X1.27 (BLADE) ×1 IMPLANT
BLADE SAW SGTL 11.0X1.19X90.0M (BLADE) ×1 IMPLANT
BNDG ELASTIC 6INX 5YD STR LF (GAUZE/BANDAGES/DRESSINGS) ×1 IMPLANT
BOWL SMART MIX CTS (DISPOSABLE) ×1 IMPLANT
CEMENT HV SMART SET (Cement) ×2 IMPLANT
COVER SURGICAL LIGHT HANDLE (MISCELLANEOUS) ×1 IMPLANT
CUFF TRNQT CYL 34X4.125X (TOURNIQUET CUFF) ×1 IMPLANT
DERMABOND ADVANCED .7 DNX12 (GAUZE/BANDAGES/DRESSINGS) ×1 IMPLANT
DRAPE U-SHAPE 47X51 STRL (DRAPES) ×1 IMPLANT
DRSG AQUACEL AG ADV 3.5X10 (GAUZE/BANDAGES/DRESSINGS) ×1 IMPLANT
DURAPREP 26ML APPLICATOR (WOUND CARE) ×1 IMPLANT
ELECT REM PT RETURN 15FT ADLT (MISCELLANEOUS) ×1 IMPLANT
GLOVE BIO SURGEON STRL SZ 6.5 (GLOVE) IMPLANT
GLOVE BIO SURGEON STRL SZ7 (GLOVE) IMPLANT
GLOVE BIO SURGEON STRL SZ8 (GLOVE) ×1 IMPLANT
GLOVE BIOGEL PI IND STRL 7.0 (GLOVE) IMPLANT
GLOVE BIOGEL PI IND STRL 8 (GLOVE) ×1 IMPLANT
GOWN STRL REUS W/ TWL LRG LVL3 (GOWN DISPOSABLE) ×1 IMPLANT
HOLDER FOLEY CATH W/STRAP (MISCELLANEOUS) IMPLANT
IMMOBILIZER KNEE 20 (SOFTGOODS) ×1
IMMOBILIZER KNEE 20 THIGH 36 (SOFTGOODS) ×1 IMPLANT
KIT TURNOVER KIT A (KITS) IMPLANT
MANIFOLD NEPTUNE II (INSTRUMENTS) ×1 IMPLANT
NS IRRIG 1000ML POUR BTL (IV SOLUTION) ×1 IMPLANT
PACK TOTAL KNEE CUSTOM (KITS) ×1 IMPLANT
PADDING CAST COTTON 6X4 STRL (CAST SUPPLIES) ×2 IMPLANT
PIN STEINMAN FIXATION KNEE (PIN) IMPLANT
PROTECTOR NERVE ULNAR (MISCELLANEOUS) ×1 IMPLANT
SET HNDPC FAN SPRY TIP SCT (DISPOSABLE) ×1 IMPLANT
SUT MNCRL AB 4-0 PS2 18 (SUTURE) ×1 IMPLANT
SUT STRATAFIX 0 PDS 27 VIOLET (SUTURE) ×1
SUT VIC AB 2-0 CT1 TAPERPNT 27 (SUTURE) ×3 IMPLANT
SUTURE STRATFX 0 PDS 27 VIOLET (SUTURE) ×1 IMPLANT
TIBIAL BASE ROT PLAT SZ 5 KNEE (Knees) ×1 IMPLANT
TRAY FOLEY MTR SLVR 14FR STAT (SET/KITS/TRAYS/PACK) IMPLANT
TRAY FOLEY MTR SLVR 16FR STAT (SET/KITS/TRAYS/PACK) IMPLANT
TUBE SUCTION HIGH CAP CLEAR NV (SUCTIONS) ×1 IMPLANT
WATER STERILE IRR 1000ML POUR (IV SOLUTION) ×2 IMPLANT
WRAP KNEE MAXI GEL POST OP (GAUZE/BANDAGES/DRESSINGS) ×1 IMPLANT

## 2023-04-14 NOTE — Plan of Care (Signed)
  Problem: Education: Goal: Knowledge of General Education information will improve Description: Including pain rating scale, medication(s)/side effects and non-pharmacologic comfort measures Outcome: Progressing   Problem: Health Behavior/Discharge Planning: Goal: Ability to manage health-related needs will improve Outcome: Progressing   Problem: Clinical Measurements: Goal: Ability to maintain clinical measurements within normal limits will improve Outcome: Progressing Goal: Will remain free from infection Outcome: Progressing Goal: Diagnostic test results will improve Outcome: Progressing Goal: Respiratory complications will improve Outcome: Progressing Goal: Cardiovascular complication will be avoided Outcome: Progressing   Problem: Activity: Goal: Risk for activity intolerance will decrease Outcome: Progressing   Problem: Nutrition: Goal: Adequate nutrition will be maintained Outcome: Completed/Met   Problem: Coping: Goal: Level of anxiety will decrease Outcome: Progressing   Problem: Elimination: Goal: Will not experience complications related to bowel motility Outcome: Progressing Goal: Will not experience complications related to urinary retention Outcome: Progressing   Problem: Pain Managment: Goal: General experience of comfort will improve and/or be controlled Outcome: Progressing   Problem: Safety: Goal: Ability to remain free from injury will improve Outcome: Progressing   Problem: Skin Integrity: Goal: Risk for impaired skin integrity will decrease Outcome: Progressing   Problem: Education: Goal: Knowledge of the prescribed therapeutic regimen will improve Outcome: Adequate for Discharge Goal: Individualized Educational Video(s) Outcome: Completed/Met   Problem: Activity: Goal: Ability to avoid complications of mobility impairment will improve Outcome: Progressing Goal: Range of joint motion will improve Outcome: Adequate for Discharge    Problem: Clinical Measurements: Goal: Postoperative complications will be avoided or minimized Outcome: Progressing   Problem: Pain Management: Goal: Pain level will decrease with appropriate interventions Outcome: Progressing   Problem: Skin Integrity: Goal: Will show signs of wound healing Outcome: Progressing

## 2023-04-14 NOTE — Plan of Care (Signed)
   Problem: Education: Goal: Knowledge of General Education information will improve Description Including pain rating scale, medication(s)/side effects and non-pharmacologic comfort measures Outcome: Progressing

## 2023-04-14 NOTE — Progress Notes (Signed)
Orthopedic Tech Progress Note Patient Details:  Meghan Schwartz 02/13/1951 [id number]  CPM Left Knee CPM Left Knee: On Left Knee Flexion (Degrees): 40 Left Knee Extension (Degrees): 10  Post Interventions Patient Tolerated: Well  [name] 04/14/2023, 2:02 PM

## 2023-04-14 NOTE — Discharge Instructions (Signed)
Meghan Gross, MD Total Joint Specialist [facility] [place] [address] [place], [place] [address] [phone]  TOTAL KNEE REPLACEMENT POSTOPERATIVE DIRECTIONS    Knee Rehabilitation, Guidelines Following Surgery  Results after knee surgery are often greatly improved when you follow the exercise, range of motion and muscle strengthening exercises prescribed by your doctor. Safety measures are also important to protect the knee from further injury. If any of these exercises cause you to have increased pain or swelling in your knee joint, decrease the amount until you are comfortable again and slowly increase them. If you have problems or questions, call your caregiver or physical therapist for advice.   BLOOD CLOT PREVENTION Take an 81 mg Aspirin two times a day for three weeks following surgery with your usual Plavix dose. Then take an 81 mg Aspirin once a day for three weeks with your usual Plavix. Then discontinue Aspirin. You may resume your vitamins/supplements upon discharge from the hospital.   HOME CARE INSTRUCTIONS  Remove items at home which could result in a fall. This includes throw rugs or furniture in walking pathways.  ICE to the affected knee as much as tolerated. Icing helps control swelling. If the swelling is well controlled you will be more comfortable and rehab easier. Continue to use ice on the knee for pain and swelling from surgery. You may notice swelling that will progress down to the foot and ankle. This is normal after surgery. Elevate the leg when you are not up walking on it.    Continue to use the breathing machine which will help keep your temperature down. It is common for your temperature to cycle up and down following surgery, especially at night when you are not up moving around and exerting yourself. The breathing machine keeps your lungs expanded and your temperature down. Do not place pillow under the operative knee, focus on  keeping the knee straight while resting  DIET You may resume your previous home diet once you are discharged from the hospital.  DRESSING / WOUND CARE / SHOWERING Keep your bulky bandage on for 2 days. On the third post-operative day you may remove the Ace bandage and gauze. There is a waterproof adhesive bandage on your skin which will stay in place until your first follow-up appointment. Once you remove this you will not need to place another bandage You may begin showering 3 days following surgery, but do not submerge the incision under water.  ACTIVITY For the first 5 days, the key is rest and control of pain and swelling Do your home exercises twice a day starting on post-operative day 3. On the days you go to physical therapy, just do the home exercises once that day. You should rest, ice and elevate the leg for 50 minutes out of every hour. Get up and walk/stretch for 10 minutes per hour. After 5 days you can increase your activity slowly as tolerated. Walk with your walker as instructed. Use the walker until you are comfortable transitioning to a cane. Walk with the cane in the opposite hand of the operative leg. You may discontinue the cane once you are comfortable and walking steadily. Avoid periods of inactivity such as sitting longer than an hour when not asleep. This helps prevent blood clots.  You may discontinue the knee immobilizer once you are able to perform a straight leg raise while lying down. You may resume a sexual relationship in one month or when given the OK by your doctor.  You may return to work once you are cleared by your doctor.  Do not drive a car for 6 weeks or until released by your surgeon.  Do not drive while taking narcotics.  TED HOSE STOCKINGS Wear the elastic stockings on both legs for three weeks following surgery during the day. You may remove them at night for sleeping.  WEIGHT BEARING Weight bearing as tolerated with assist device (walker, cane,  etc) as directed, use it as long as suggested by your surgeon or therapist, typically at least 4-6 weeks.  POSTOPERATIVE CONSTIPATION PROTOCOL Constipation - defined medically as fewer than three stools per week and severe constipation as less than one stool per week.  One of the most common issues patients have following surgery is constipation.  Even if you have a regular bowel pattern at home, your normal regimen is likely to be disrupted due to multiple reasons following surgery.  Combination of anesthesia, postoperative narcotics, change in appetite and fluid intake all can affect your bowels.  In order to avoid complications following surgery, here are some recommendations in order to help you during your recovery period.  Colace (docusate) - Pick up an over-the-counter form of Colace or another stool softener and take twice a day as long as you are requiring postoperative pain medications.  Take with a full glass of water daily.  If you experience loose stools or diarrhea, hold the colace until you stool forms back up. If your symptoms do not get better within 1 week or if they get worse, check with your doctor. Dulcolax (bisacodyl) - Pick up over-the-counter and take as directed by the product packaging as needed to assist with the movement of your bowels.  Take with a full glass of water.  Use this product as needed if not relieved by Colace only.  MiraLax (polyethylene glycol) - Pick up over-the-counter to have on hand. MiraLax is a solution that will increase the amount of water in your bowels to assist with bowel movements.  Take as directed and can mix with a glass of water, juice, soda, coffee, or tea. Take if you go more than two days without a movement. Do not use MiraLax more than once per day. Call your doctor if you are still constipated or irregular after using this medication for 7 days in a row.  If you continue to have problems with postoperative constipation, please contact the  office for further assistance and recommendations.  If you experience "the worst abdominal pain ever" or develop nausea or vomiting, please contact the office immediatly for further recommendations for treatment.  ITCHING If you experience itching with your medications, try taking only a single pain pill, or even half a pain pill at a time.  You can also use Benadryl over the counter for itching or also to help with sleep.   MEDICATIONS See your medication summary on the "After Visit Summary" that the nursing staff will review with you prior to discharge.  You may have some home medications which will be placed on hold until you complete the course of blood thinner medication.  It is important for you to complete the blood thinner medication as prescribed by your surgeon.  Continue your approved medications as instructed at time of discharge.  PRECAUTIONS If you experience chest pain or shortness of breath - call 911 immediately for transfer to the hospital emergency department.  If you develop a fever greater that 101 F, purulent drainage from wound, increased redness or drainage from  wound, foul odor from the wound/dressing, or calf pain - CONTACT YOUR SURGEON.                                                   FOLLOW-UP APPOINTMENTS Make sure you keep all of your appointments after your operation with your surgeon and caregivers. You should call the office at the above phone number and make an appointment for approximately two weeks after the date of your surgery or on the date instructed by your surgeon outlined in the "After Visit Summary".  RANGE OF MOTION AND STRENGTHENING EXERCISES  Rehabilitation of the knee is important following a knee injury or an operation. After just a few days of immobilization, the muscles of the thigh which control the knee become weakened and shrink (atrophy). Knee exercises are designed to build up the tone and strength of the thigh muscles and to improve knee  motion. Often times heat used for twenty to thirty minutes before working out will loosen up your tissues and help with improving the range of motion but do not use heat for the first two weeks following surgery. These exercises can be done on a training (exercise) mat, on the floor, on a table or on a bed. Use what ever works the best and is most comfortable for you Knee exercises include:  Leg Lifts - While your knee is still immobilized in a splint or cast, you can do straight leg raises. Lift the leg to 60 degrees, hold for 3 sec, and slowly lower the leg. Repeat 10-20 times 2-3 times daily. Perform this exercise against resistance later as your knee gets better.  Quad and Hamstring Sets - Tighten up the muscle on the front of the thigh (Quad) and hold for 5-10 sec. Repeat this 10-20 times hourly. Hamstring sets are done by pushing the foot backward against an object and holding for 5-10 sec. Repeat as with quad sets.  Leg Slides: Lying on your back, slowly slide your foot toward your buttocks, bending your knee up off the floor (only go as far as is comfortable). Then slowly slide your foot back down until your leg is flat on the floor again. Angel Wings: Lying on your back spread your legs to the side as far apart as you can without causing discomfort.  A rehabilitation program following serious knee injuries can speed recovery and prevent re-injury in the future due to weakened muscles. Contact your doctor or a physical therapist for more information on knee rehabilitation.   POST-OPERATIVE OPIOID TAPER INSTRUCTIONS: It is important to wean off of your opioid medication as soon as possible. If you do not need pain medication after your surgery it is ok to stop day one. Opioids include: Codeine, Hydrocodone(Norco, Vicodin), Oxycodone(Percocet, oxycontin) and hydromorphone amongst others.  Long term and even short term use of opiods can cause: Increased pain  response Dependence Constipation Depression Respiratory depression And more.  Withdrawal symptoms can include Flu like symptoms Nausea, vomiting And more Techniques to manage these symptoms Hydrate well Eat regular healthy meals Stay active Use relaxation techniques(deep breathing, meditating, yoga) Do Not substitute Alcohol to help with tapering If you have been on opioids for less than two weeks and do not have pain than it is ok to stop all together.  Plan to wean off of opioids This plan should start  within one week post op of your joint replacement. Maintain the same interval or time between taking each dose and first decrease the dose.  Cut the total daily intake of opioids by one tablet each day Next start to increase the time between doses. The last dose that should be eliminated is the evening dose.   IF YOU ARE TRANSFERRED TO A SKILLED REHAB FACILITY If the patient is transferred to a skilled rehab facility following release from the hospital, a list of the current medications will be sent to the facility for the patient to continue.  When discharged from the skilled rehab facility, please have the facility set up the patient's Home Health Physical Therapy prior to being released. Also, the skilled facility will be responsible for providing the patient with their medications at time of release from the facility to include their pain medication, the muscle relaxants, and their blood thinner medication. If the patient is still at the rehab facility at time of the two week follow up appointment, the skilled rehab facility will also need to assist the patient in arranging follow up appointment in our office and any transportation needs.  MAKE SURE YOU:  Understand these instructions.  Get help right away if you are not doing well or get worse.   DENTAL ANTIBIOTICS:  In most cases prophylactic antibiotics for Dental procdeures after total joint surgery are not  necessary.  Exceptions are as follows:  1. History of prior total joint infection  2. Severely immunocompromised (Organ Transplant, cancer chemotherapy, Rheumatoid biologic meds such as Humera)  3. Poorly controlled diabetes (A1C &gt; 8.0, blood glucose over 200)  If you have one of these conditions, contact your surgeon for an antibiotic prescription, prior to your dental procedure.    Pick up stool softner and laxative for home use following surgery while on pain medications. Do not submerge incision under water. Please use good hand washing techniques while changing dressing each day. May shower starting three days after surgery. Please use a clean towel to pat the incision dry following showers. Continue to use ice for pain and swelling after surgery. Do not use any lotions or creams on the incision until instructed by your surgeon.

## 2023-04-14 NOTE — Anesthesia Postprocedure Evaluation (Signed)
Anesthesia Post Note  Patient: Meghan Schwartz  Procedure(s) Performed: TOTAL KNEE ARTHROPLASTY (Left: Knee)     Patient location during evaluation: PACU Anesthesia Type: Spinal Level of consciousness: awake and alert Pain management: pain level controlled Vital Signs Assessment: post-procedure vital signs reviewed and stable Respiratory status: spontaneous breathing, nonlabored ventilation, respiratory function stable and patient connected to nasal cannula oxygen Cardiovascular status: blood pressure returned to baseline and stable Postop Assessment: no apparent nausea or vomiting Anesthetic complications: no   No notable events documented.  Last Vitals:  Vitals:   04/14/23 1415 04/14/23 1434  BP: (!) 104/59 106/71  Pulse: 60 64  Resp: 12 16  Temp:  37 C  SpO2: 98% 97%    Last Pain:  Vitals:   04/14/23 1434  TempSrc: Oral  PainSc:                  [place]

## 2023-04-14 NOTE — Evaluation (Signed)
Physical Therapy Evaluation Patient Details Name: Meghan Schwartz MRN: [id number] DOB: Apr 23, 1950 Today's Date: 04/14/2023  History of Present Illness  73 yo female presents to therapy s/p L TKA on 2/17/2-25 due to failure of conservative measures. Pt PMH Includes but is not limited to: hypocalcemia, falls, HTN, AKI, chronic pain, dCHF, LE edema, CAD s/p stent, NSTEMI, angina, HLD, RA, depression, anxiety, tobacco abuse, HTN, and  benzodiazepine dependence.  Clinical Impression    Meghan Schwartz is a 73 y.o. female POD 0 s/p L TKA. Patient reports mod I with mobility at baseline. Patient is now limited by functional impairments (see PT problem list below) and requires min A for bed mobility and min A for transfers. Patient was able to ambulate 4  feet with RW and CGA and cues level of assist. Patient instructed in exercise to facilitate ROM and circulation to manage edema. Pt O2 saturation fluctuating on RS 88-94%.  Patient will benefit from continued skilled PT interventions to address impairments and progress towards PLOF. Acute PT will follow to progress mobility and stair training in preparation for safe discharge home with family support and OPPT services.       If plan is discharge home, recommend the following: A little help with walking and/or transfers;A little help with bathing/dressing/bathroom;Assistance with cooking/housework;Assist for transportation;Help with stairs or ramp for entrance   Can travel by private vehicle        Equipment Recommendations None recommended by PT  Recommendations for Other Services       Functional Status Assessment Patient has had a recent decline in their functional status and demonstrates the ability to make significant improvements in function in a reasonable and predictable amount of time.     Precautions / Restrictions Precautions Precautions: Cervical;Fall Restrictions Weight Bearing Restrictions Per Provider Order: No      Mobility  Bed  Mobility Overal bed mobility: Needs Assistance Bed Mobility: Supine to Sit     Supine to sit: Min assist, HOB elevated, Used rails     General bed mobility comments: min A to initiate L LE to EOB and pt able to complete with CGA, cues and use of hospital bed    Transfers Overall transfer level: Needs assistance   Transfers: Sit to/from Stand Sit to Stand: Min assist           General transfer comment: cues and encouragement    Ambulation/Gait Ambulation/Gait assistance: Contact guard assist Gait Distance (Feet): 4 Feet Assistive device: Rolling walker (2 wheels) Gait Pattern/deviations: Step-to pattern, Antalgic, Trunk flexed Gait velocity: severly decreased     General Gait Details: pt peforming step almost to pattern with pt placing L LE anteirorly, trunk flexion use of B UE support at RW to offload L LE, cues for safety and sequencing as well as encouragement  Stairs            Wheelchair Mobility     Tilt Bed    Modified Rankin (Stroke Patients Only)       Balance Overall balance assessment: Needs assistance Sitting-balance support: Feet supported Sitting balance-Leahy Scale: Good     Standing balance support: Bilateral upper extremity supported, During functional activity, Reliant on assistive device for balance Standing balance-Leahy Scale: Poor                               Pertinent Vitals/Pain Pain Assessment Pain Assessment: 0-10 Pain Score: 10-Worst pain ever Pain Location: L  knee and LE Pain Descriptors / Indicators: Aching, Discomfort, Grimacing, Operative site guarding, Sore Pain Intervention(s): Limited activity within patient's tolerance, Monitored during session, Premedicated before session, Repositioned, Ice applied    Home Living Family/patient expects to be discharged to:: Private residence Living Arrangements: Spouse/significant other Available Help at Discharge: Family;Available 24 hours/day Type of Home:  House Home Access: Stairs to enter Entrance Stairs-Rails: None Entrance Stairs-Number of Steps: 2 Alternate Level Stairs-Number of Steps: flight (8 steps and a landing then an additional 8 steps) Home Layout: Two level;Bed/bath upstairs Home Equipment: Cane - single point;Rolling Walker (2 wheels);Rollator (4 wheels) Additional Comments: Wears O2 at night    Prior Function Prior Level of Function : Driving;Independent/Modified Independent             Mobility Comments: mod I with intermittent use of SPC and Rollator for community distances, in home no AD, mod I for all ADLs, self care tasks and IADLs ADLs Comments: Independent with ADLs, IADLs, drives, cooks, grocery shops     Extremity/Trunk Assessment        Lower Extremity Assessment Lower Extremity Assessment: LLE deficits/detail LLE Deficits / Details: ankle DF/PF 5/5; SLR with 10 degree lag LLE Sensation: WNL    Cervical / Trunk Assessment Cervical / Trunk Assessment: Normal  Communication   Communication Communication: No apparent difficulties    Cognition Arousal: Alert Behavior During Therapy: WFL for tasks assessed/performed   PT - Cognitive impairments: No apparent impairments                         Following commands: Intact       Cueing       General Comments      Exercises Total Joint Exercises Ankle Circles/Pumps: AROM, Both, 10 reps   Assessment/Plan    PT Assessment Patient needs continued PT services  PT Problem List Decreased strength;Decreased range of motion;Decreased activity tolerance;Decreased balance;Decreased mobility;Decreased coordination;Pain       PT Treatment Interventions DME instruction;Gait training;Stair training;Functional mobility training;Therapeutic activities;Therapeutic exercise;Balance training;Neuromuscular re-education;Patient/family education;Modalities    PT Goals (Current goals can be found in the Care Plan section)  Acute Rehab PT  Goals Patient Stated Goal: to be able to walk no AD or pain (short term to get the pain undercontrol) PT Goal Formulation: With patient Time For Goal Achievement: 05/05/23 Potential to Achieve Goals: Good    Frequency 7X/week     Co-evaluation               AM-PAC PT "6 Clicks" Mobility  Outcome Measure Help needed turning from your back to your side while in a flat bed without using bedrails?: A Little Help needed moving from lying on your back to sitting on the side of a flat bed without using bedrails?: A Little Help needed moving to and from a bed to a chair (including a wheelchair)?: A Little Help needed standing up from a chair using your arms (e.g., wheelchair or bedside chair)?: A Little Help needed to walk in hospital room?: A Little Help needed climbing 3-5 steps with a railing? : Total 6 Click Score: 16    End of Session Equipment Utilized During Treatment: Gait belt Activity Tolerance: Patient limited by pain Patient left: in chair;with call bell/phone within reach;with family/visitor present Nurse Communication: Mobility status PT Visit Diagnosis: Other abnormalities of gait and mobility (R26.89);Unsteadiness on feet (R26.81);Difficulty in walking, not elsewhere classified (R26.2);Pain Pain - Right/Left: Left Pain - part of body: Knee;Leg  Time: 1610-9604 PT Time Calculation (min) (ACUTE ONLY): 29 min   Charges:   PT Evaluation $PT Eval Low Complexity: 1 Low PT Treatments $Therapeutic Activity: 8-22 mins PT General Charges $$ ACUTE PT VISIT: 1 Visit         Johnny Bridge, PT Acute Rehab   Jacqualyn Posey 04/14/2023, 6:53 PM

## 2023-04-14 NOTE — Progress Notes (Signed)
Orthopedic Tech Progress Note Patient Details:  Meghan Schwartz [birth year] [id number]  CPM Left Knee CPM Left Knee: Off Left Knee Flexion (Degrees): 40 Left Knee Extension (Degrees): 10  Post Interventions Patient Tolerated: Well CPM removed by 3W staff. [name] 04/14/2023, 6:23 PM

## 2023-04-14 NOTE — Anesthesia Procedure Notes (Signed)
Anesthesia Regional Block: Adductor canal block   Pre-Anesthetic Checklist: , timeout performed,  Correct Patient, Correct Site, Correct Laterality,  Correct Procedure, Correct Position, site marked,  Risks and benefits discussed,  Surgical consent,  Pre-op evaluation,  At surgeon's request and post-op pain management  Laterality: Left  Prep: chloraprep       Needles:  Injection technique: Single-shot  Needle Type: Echogenic Stimulator Needle     Needle Length: 9cm  Needle Gauge: 21     Additional Needles:   Procedures:,,,, ultrasound used (permanent image in chart),,    Narrative:  Start time: 04/14/2023 11:15 AM End time: 04/14/2023 11:19 AM Injection made incrementally with aspirations every 5 mL.  Performed by: Personally  Anesthesiologist: Bolan Nation, MD  Additional Notes: Discussed risks and benefits of the nerve block in detail, including but not limited vascular injury, permanent nerve damage and infection.   Patient tolerated the procedure well. Local anesthetic introduced in an incremental fashion under minimal resistance after negative aspirations. No paresthesias were elicited. After completion of the procedure, no acute issues were identified and patient continued to be monitored by RN.

## 2023-04-14 NOTE — Op Note (Signed)
OPERATIVE REPORT-TOTAL KNEE ARTHROPLASTY   Pre-operative diagnosis- Osteoarthritis  Left knee(s)  Post-operative diagnosis- Osteoarthritis Left knee(s)  Procedure-  Left  Total Knee Arthroplasty  Surgeon- Meghan Schwartz. Tiyon Sanor, MD  Assistant- Weston Brass, PA-C   Anesthesia-   Adductor canal block and spinal  EBL-25 mL   Drains None  Tourniquet time- 37 minutes @ 300 mm Hg  Complications- None  Condition-PACU - hemodynamically stable.   Brief Clinical Note  Meghan Schwartz is a 73 y.o. year old female with end stage OA of her left knee with progressively worsening pain and dysfunction. She has constant pain, with activity and at rest and significant functional deficits with difficulties even with ADLs. She has had extensive non-op management including analgesics, injections of cortisone and viscosupplements, and home exercise program, but remains in significant pain with significant dysfunction. Radiographs show bone on bone arthritis lateral and patellofemoral with significant valgus deformity. She presents now for left Total Knee Arthroplasty.     Procedure in detail---   The patient is brought into the operating room and positioned supine on the operating table. After successful administration of  Adductor canal block and spinal,   a tourniquet is placed high on the  Left thigh(s) and the lower extremity is prepped and draped in the usual sterile fashion. Time out is performed by the operating team and then the  Left lower extremity is wrapped in Esmarch, knee flexed and the tourniquet inflated to 300 mmHg.       A midline incision is made with a ten blade through the subcutaneous tissue to the level of the extensor mechanism. A fresh blade is used to make a medial parapatellar arthrotomy. Soft tissue over the proximal medial tibia is subperiosteally elevated to the joint line with a knife and into the semimembranosus bursa with a Cobb elevator. Soft tissue over the proximal lateral  tibia is elevated with attention being paid to avoiding the patellar tendon on the tibial tubercle. The patella is everted, knee flexed 90 degrees and the ACL and PCL are removed. Findings are bone on bone lateral and patellofemoral with large global osteophytes.        The drill is used to create a starting hole in the distal femur and the canal is thoroughly irrigated with sterile saline to remove the fatty contents. The 5 degree Left  valgus alignment guide is placed into the femoral canal and the distal femoral cutting block is pinned to remove 9 mm off the distal femur. Resection is made with an oscillating saw.      The tibia is subluxed forward and the menisci are removed. The extramedullary alignment guide is placed referencing proximally at the medial aspect of the tibial tubercle and distally along the second metatarsal axis and tibial crest. The block is pinned to remove 2mm off the more deficient lateral  side. Resection is made with an oscillating saw. Size 5is the most appropriate size for the tibia and the proximal tibia is prepared with the modular drill and keel punch for that size.      The femoral sizing guide is placed and size 4 is most appropriate. Rotation is marked off the epicondylar axis and confirmed by creating a rectangular flexion gap at 90 degrees. The size 4 cutting block is pinned in this rotation and the anterior, posterior and chamfer cuts are made with the oscillating saw. The intercondylar block is then placed and that cut is made.      Trial size 5  tibial component, trial size 4 posterior stabilized femur and a 10  mm posterior stabilized rotating platform insert trial is placed. Full extension is achieved with excellent varus/valgus and anterior/posterior balance throughout full range of motion. The patella is everted and thickness measured to be 22  mm. Free hand resection is taken to 12 mm, a 38 template is placed, lug holes are drilled, trial patella is placed, and it  tracks normally. Osteophytes are removed off the posterior femur with the trial in place. All trials are removed and the cut bone surfaces prepared with pulsatile lavage. Cement is mixed and once ready for implantation, the size 5 tibial implant, size  4 posterior stabilized femoral component, and the size 38 patella are cemented in place and the patella is held with the clamp. The trial insert is placed and the knee held in full extension. The Exparel (20 ml mixed with 60 ml saline) is injected into the extensor mechanism, posterior capsule, medial and lateral gutters and subcutaneous tissues.  All extruded cement is removed and once the cement is hard the permanent 10 mm posterior stabilized rotating platform insert is placed into the tibial tray.      The wound is copiously irrigated with saline solution and the extensor mechanism closed with # 0 Stratofix suture. The tourniquet is released for a total tourniquet time of 37  minutes. Flexion against gravity is 140 degrees and the patella tracks normally. Subcutaneous tissue is closed with 2.0 vicryl and subcuticular with running 4.0 Monocryl. The incision is cleaned and dried and steri-strips and a bulky sterile dressing are applied. The limb is placed into a knee immobilizer and the patient is awakened and transported to recovery in stable condition.      Please note that a surgical assistant was a medical necessity for this procedure in order to perform it in a safe and expeditious manner. Surgical assistant was necessary to retract the ligaments and vital neurovascular structures to prevent injury to them and also necessary for proper positioning of the limb to allow for anatomic placement of the prosthesis.   Meghan Schwartz Meghan Caravello, MD    04/14/2023, 1:02 PM

## 2023-04-14 NOTE — Care Plan (Signed)
Ortho Bundle Case Management Note  Patient Details  Name: Meghan Schwartz MRN: [id number] Date of Birth: 05-17-1950  L TKA on 04-14-23 DCP:  Home with husband DME:  No needs, has a RW PT:  [facility] on 04-17-23                   DME Arranged:  N/A DME Agency:  NA  HH Arranged:  NA HH Agency:  NA  Additional Comments: Please contact me with any questions of if this plan should need to change.  Ennis Forts, RN,CCM [facility]  [phone] 04/14/2023, 11:32 AM

## 2023-04-14 NOTE — Transfer of Care (Signed)
Immediate Anesthesia Transfer of Care Note  Patient: Meghan Schwartz  Procedure(s) Performed: TOTAL KNEE ARTHROPLASTY (Left: Knee)  Patient Location: PACU  Anesthesia Type:Regional and MAC combined with regional for post-op pain  Level of Consciousness: awake, alert , oriented, and patient cooperative  Airway & Oxygen Therapy: Patient Spontanous Breathing and Patient connected to face mask oxygen  Post-op Assessment: Report given to RN and Post -op Vital signs reviewed and stable  Post vital signs: Reviewed and stable  Last Vitals:  Vitals Value Taken Time  BP 93/53 04/14/23 1319  Temp    Pulse 71 04/14/23 1321  Resp 7 04/14/23 1321  SpO2 90 % 04/14/23 1321  Vitals shown include unfiled device data.  Last Pain:  Vitals:   04/14/23 1120  TempSrc:   PainSc: 0-No pain         Complications: No notable events documented.

## 2023-04-14 NOTE — Anesthesia Procedure Notes (Signed)
Spinal  Patient location during procedure: OR Start time: 04/14/2023 11:40 AM End time: 04/14/2023 11:45 AM Reason for block: surgical anesthesia Staffing Performed: anesthesiologist  Anesthesiologist: [place] Nation, MD Performed by: Whigham Nation, MD Authorized by: [place] Nation, MD   Preanesthetic Checklist Completed: patient identified, IV checked, site marked, risks and benefits discussed, surgical consent, monitors and equipment checked, pre-op evaluation and timeout performed Spinal Block Patient position: sitting Prep: DuraPrep Patient monitoring: heart rate, cardiac monitor, continuous pulse ox and blood pressure Approach: midline Location: L3-4 Needle Needle type: Pencan  Needle gauge: 24 G Assessment Sensory level: T6 Additional Notes Functioning IV was confirmed and monitors were applied. Sterile prep and drape, including hand hygiene and sterile gloves were used. The patient was positioned and the spine was prepped. The skin was anesthetized with lidocaine.  Free flow of clear CSF was obtained prior to injecting local anesthetic into the CSF.  The spinal needle aspirated freely following injection.  The needle was carefully withdrawn.  The patient tolerated the procedure well.

## 2023-04-14 NOTE — Interval H&P Note (Signed)
History and Physical Interval Note:  04/14/2023 9:40 AM  Meghan Schwartz  has presented today for surgery, with the diagnosis of left knee osteoarthritis.  The various methods of treatment have been discussed with the patient and family. After consideration of risks, benefits and other options for treatment, the patient has consented to  Procedure(s): TOTAL KNEE ARTHROPLASTY (Left) as a surgical intervention.  The patient's history has been reviewed, patient examined, no change in status, stable for surgery.  I have reviewed the patient's chart and labs.  Questions were answered to the patient's satisfaction.     Homero Fellers Jia Mohamed

## 2023-04-15 ENCOUNTER — Encounter (HOSPITAL_COMMUNITY): Payer: Self-pay | Admitting: Orthopedic Surgery

## 2023-04-15 ENCOUNTER — Other Ambulatory Visit (HOSPITAL_COMMUNITY): Payer: Self-pay

## 2023-04-15 DIAGNOSIS — M1712 Unilateral primary osteoarthritis, left knee: Secondary | ICD-10-CM | POA: Diagnosis not present

## 2023-04-15 DIAGNOSIS — I2510 Atherosclerotic heart disease of native coronary artery without angina pectoris: Secondary | ICD-10-CM | POA: Diagnosis not present

## 2023-04-15 DIAGNOSIS — I5032 Chronic diastolic (congestive) heart failure: Secondary | ICD-10-CM | POA: Diagnosis not present

## 2023-04-15 DIAGNOSIS — Z79899 Other long term (current) drug therapy: Secondary | ICD-10-CM | POA: Diagnosis not present

## 2023-04-15 DIAGNOSIS — I110 Hypertensive heart disease with heart failure: Secondary | ICD-10-CM | POA: Diagnosis not present

## 2023-04-15 DIAGNOSIS — Z7902 Long term (current) use of antithrombotics/antiplatelets: Secondary | ICD-10-CM | POA: Diagnosis not present

## 2023-04-15 DIAGNOSIS — F17210 Nicotine dependence, cigarettes, uncomplicated: Secondary | ICD-10-CM | POA: Diagnosis not present

## 2023-04-15 DIAGNOSIS — Z955 Presence of coronary angioplasty implant and graft: Secondary | ICD-10-CM | POA: Diagnosis not present

## 2023-04-15 LAB — CBC
HCT: 29.5 % — ABNORMAL LOW (ref 36.0–46.0)
Hemoglobin: 9.2 g/dL — ABNORMAL LOW (ref 12.0–15.0)
MCH: 28.5 pg (ref 26.0–34.0)
MCHC: 31.2 g/dL (ref 30.0–36.0)
MCV: 91.3 fL (ref 80.0–100.0)
Platelets: 146 10*3/uL — ABNORMAL LOW (ref 150–400)
RBC: 3.23 MIL/uL — ABNORMAL LOW (ref 3.87–5.11)
RDW: 12.7 % (ref 11.5–15.5)
WBC: 5.9 10*3/uL (ref 4.0–10.5)
nRBC: 0 % (ref 0.0–0.2)

## 2023-04-15 LAB — BASIC METABOLIC PANEL
Anion gap: 8 (ref 5–15)
BUN: 20 mg/dL (ref 8–23)
CO2: 25 mmol/L (ref 22–32)
Calcium: 8.8 mg/dL — ABNORMAL LOW (ref 8.9–10.3)
Chloride: 106 mmol/L (ref 98–111)
Creatinine, Ser: 0.95 mg/dL (ref 0.44–1.00)
GFR, Estimated: >60 mL/min (ref >60)
Glucose, Bld: 120 mg/dL — ABNORMAL HIGH (ref 70–99)
Potassium: 4.3 mmol/L (ref 3.5–5.1)
Sodium: 139 mmol/L (ref 135–145)

## 2023-04-15 MED ORDER — BUPRENORPHINE HCL-NALOXONE HCL 8-2 MG SL SUBL
1.0000 | SUBLINGUAL_TABLET | Freq: Three times a day (TID) | SUBLINGUAL | Status: DC
  Administered 2023-04-15 – 2023-04-16 (×4): 1 via SUBLINGUAL
  Filled 2023-04-15 (×4): qty 1

## 2023-04-15 MED ORDER — HYDROMORPHONE HCL 2 MG PO TABS
2.0000 mg | ORAL_TABLET | ORAL | Status: DC | PRN
  Administered 2023-04-15: 4 mg via ORAL
  Administered 2023-04-15 (×2): 2 mg via ORAL
  Administered 2023-04-16 (×3): 4 mg via ORAL
  Filled 2023-04-15 (×2): qty 2
  Filled 2023-04-15: qty 1
  Filled 2023-04-15 (×2): qty 2
  Filled 2023-04-15: qty 1

## 2023-04-15 MED ORDER — ONDANSETRON HCL 4 MG PO TABS: 4.0000 mg | ORAL_TABLET | Freq: Four times a day (QID) | ORAL | 0 refills | Status: DC | PRN

## 2023-04-15 MED ORDER — ONDANSETRON HCL 4 MG PO TABS
4.0000 mg | ORAL_TABLET | Freq: Four times a day (QID) | ORAL | 0 refills | Status: DC | PRN
  Filled 2023-04-15: qty 20, 5d supply, fill #0

## 2023-04-15 MED ORDER — ASPIRIN 325 MG PO TBEC: 325.0000 mg | DELAYED_RELEASE_TABLET | Freq: Every day | ORAL | 0 refills | Status: DC

## 2023-04-15 MED ORDER — ASPIRIN 325 MG PO TBEC
325.0000 mg | DELAYED_RELEASE_TABLET | Freq: Every day | ORAL | 0 refills | Status: AC
  Filled 2023-04-15: qty 20, 20d supply, fill #0

## 2023-04-15 MED ORDER — METHOCARBAMOL 500 MG PO TABS: 500.0000 mg | ORAL_TABLET | Freq: Four times a day (QID) | ORAL | 0 refills | Status: DC | PRN

## 2023-04-15 MED ORDER — METHOCARBAMOL 500 MG PO TABS
500.0000 mg | ORAL_TABLET | Freq: Four times a day (QID) | ORAL | 0 refills | Status: DC | PRN
  Filled 2023-04-15: qty 40, 10d supply, fill #0

## 2023-04-15 NOTE — Care Management Obs Status (Signed)
MEDICARE OBSERVATION STATUS NOTIFICATION   Patient Details  Name: Meghan Schwartz MRN: [id number] Date of Birth: [birth year]   Medicare Observation Status Notification Given:  Yes    Howell Rucks, RN 04/15/2023, 10:01 AM

## 2023-04-15 NOTE — Plan of Care (Signed)
  Problem: Education: Goal: Knowledge of General Education information will improve Description: Including pain rating scale, medication(s)/side effects and non-pharmacologic comfort measures Outcome: Progressing   Problem: Health Behavior/Discharge Planning: Goal: Ability to manage health-related needs will improve Outcome: Progressing   Problem: Clinical Measurements: Goal: Ability to maintain clinical measurements within normal limits will improve Outcome: Progressing Goal: Will remain free from infection Outcome: Progressing Goal: Diagnostic test results will improve Outcome: Progressing Goal: Respiratory complications will improve Outcome: Progressing Goal: Cardiovascular complication will be avoided Outcome: Progressing   Problem: Activity: Goal: Risk for activity intolerance will decrease Outcome: Adequate for Discharge   Problem: Coping: Goal: Level of anxiety will decrease Outcome: Progressing   Problem: Elimination: Goal: Will not experience complications related to bowel motility Outcome: Progressing Goal: Will not experience complications related to urinary retention Outcome: Completed/Met   Problem: Pain Managment: Goal: General experience of comfort will improve and/or be controlled Outcome: Progressing   Problem: Safety: Goal: Ability to remain free from injury will improve Outcome: Progressing   Problem: Skin Integrity: Goal: Risk for impaired skin integrity will decrease Outcome: Progressing   Problem: Education: Goal: Knowledge of the prescribed therapeutic regimen will improve Outcome: Adequate for Discharge   Problem: Activity: Goal: Ability to avoid complications of mobility impairment will improve Outcome: Adequate for Discharge Goal: Range of joint motion will improve Outcome: Adequate for Discharge   Problem: Clinical Measurements: Goal: Postoperative complications will be avoided or minimized Outcome: Progressing   Problem: Pain  Management: Goal: Pain level will decrease with appropriate interventions Outcome: Progressing   Problem: Skin Integrity: Goal: Will show signs of wound healing Outcome: Progressing

## 2023-04-15 NOTE — Progress Notes (Signed)
PT Cancellation Note  Patient Details Name: Meghan Schwartz MRN: [id number] DOB: Dec 09, 1950   Cancelled Treatment:    Reason Eval/Treat Not Completed:  Attempted PT tx session-pt declined to participate at this time. RN reports pt has been medicated. Pt reports her pain is not yet controlled. Will return on tomorrow to continue therapy if pt is agreeable.    Faye Ramsay, PT Acute Rehabilitation  Office: [phone]

## 2023-04-15 NOTE — TOC Transition Note (Signed)
Transition of Care [facility]) - Discharge Note   Patient Details  Name: Meghan Schwartz MRN: [id number] Date of Birth: 1950-04-06  [facility] CM/SW Contact:  Howell Rucks, RN Phone Number: 04/15/2023, 10:56 AM   Clinical Narrative:   Met with pt and dtr at bedside to review dc follow up therapy and home DME needs, pt confirmed OPPT at [facility], no home DME needs, has RW. No TOC needs.     Final next level of care: OP Rehab Barriers to Discharge: No Barriers Identified   Patient Goals and CMS Choice Patient states their goals for this hospitalization and ongoing recovery are:: return home          Discharge Placement                       Discharge Plan and Services Additional resources added to the After Visit Summary for                  DME Arranged: N/A DME Agency: NA       HH Arranged: NA HH Agency: NA        Social Drivers of Health (SDOH) Interventions SDOH Screenings   Food Insecurity: No Food Insecurity (04/14/2023)  Housing: Patient Declined (04/14/2023)  Transportation Needs: Patient Declined (04/14/2023)  Utilities: Patient Declined (04/14/2023)  Alcohol Screen: Low Risk  (10/02/2017)  Social Connections: Patient Declined (04/14/2023)  Tobacco Use: High Risk (04/14/2023)     Readmission Risk Interventions     No data to display

## 2023-04-15 NOTE — Progress Notes (Signed)
Physical Therapy Treatment Patient Details Name: ALTAGRACIA RONE MRN: [id number] DOB: 1950/09/26 Today's Date: 04/15/2023   History of Present Illness 73 yo female presents to therapy s/p L TKA on 2/17/2-25 due to failure of conservative measures. Pt PMH Includes but is not limited to: hypocalcemia, falls, HTN, AKI, chronic pain, dCHF, LE edema, CAD s/p stent, NSTEMI, angina, HLD, RA, depression, anxiety, tobacco abuse, HTN, and  benzodiazepine dependence.    PT Comments  Pt agreeable to working with therapy with some encouragement. Pt with multiple complaints about various things regarding her stay. She denied dizziness during session. Assisted pt over to recliner with some difficulty. She reports issues with her pain control-encouraged continued discussions with MD/PA/RN. Will plan to have a 2nd session to continue working towards meeting PT goals. Unsure if pt will d/c home on today.     If plan is discharge home, recommend the following: A little help with walking and/or transfers;A little help with bathing/dressing/bathroom;Assistance with cooking/housework;Assist for transportation;Help with stairs or ramp for entrance   Can travel by private vehicle        Equipment Recommendations  None recommended by PT    Recommendations for Other Services       Precautions / Restrictions Precautions Precautions: Fall;Knee Restrictions Weight Bearing Restrictions Per Provider Order: No LLE Weight Bearing Per Provider Order: Weight bearing as tolerated     Mobility  Bed Mobility Overal bed mobility: Needs Assistance Bed Mobility: Supine to Sit     Supine to sit: Min assist, HOB elevated, Used rails     General bed mobility comments: Assist for L LE. Increased time. Cues provided as needed. Pt denied dizziness.    Transfers Overall transfer level: Needs assistance Equipment used: Rolling walker (2 wheels) Transfers: Sit to/from Stand, Bed to chair/wheelchair/BSC Sit to Stand: Min  assist, From elevated surface Stand pivot transfers: Min assist         General transfer comment: Assist to rise, steady, control descent. Cues for safety, technique, hand/LE placement. Some difficulty Wbing on L LE and taking steps. Pt could not take steps over to recliner-had to bring recliner up to her. Deferred ambulation this am 2* low BP/pt receiving bolus.    Ambulation/Gait                   Stairs             Wheelchair Mobility     Tilt Bed    Modified Rankin (Stroke Patients Only)       Balance Overall balance assessment: Needs assistance         Standing balance support: Bilateral upper extremity supported, During functional activity, Reliant on assistive device for balance Standing balance-Leahy Scale: Poor                              Communication Communication Communication: No apparent difficulties  Cognition Arousal: Alert Behavior During Therapy: WFL for tasks assessed/performed   PT - Cognitive impairments: No apparent impairments                       PT - Cognition Comments: in a bit of a negative mood-mutiple complaints about various things. tearful at times. Following commands: Intact      Cueing    Exercises Total Joint Exercises Ankle Circles/Pumps: AROM, Both, 10 reps Quad Sets: AROM, Left, 10 reps Heel Slides: AAROM, Left, 10 reps (pt used gait  belt) Goniometric ROM: ~10-60 degrees    General Comments        Pertinent Vitals/Pain Pain Assessment Pain Assessment: Faces Faces Pain Scale: Hurts even more Pain Location: L knee Pain Descriptors / Indicators: Aching, Discomfort, Grimacing, Operative site guarding, Sore Pain Intervention(s): Limited activity within patient's tolerance, Monitored during session, Ice applied, Repositioned, Premedicated before session, RN gave pain meds during session    Home Living                          Prior Function            PT Goals  (current goals can now be found in the care plan section) Progress towards PT goals: Progressing toward goals    Frequency    7X/week      PT Plan      Co-evaluation              AM-PAC PT "6 Clicks" Mobility   Outcome Measure  Help needed turning from your back to your side while in a flat bed without using bedrails?: A Little Help needed moving from lying on your back to sitting on the side of a flat bed without using bedrails?: A Little Help needed moving to and from a bed to a chair (including a wheelchair)?: A Little Help needed standing up from a chair using your arms (e.g., wheelchair or bedside chair)?: A Little Help needed to walk in hospital room?: A Lot Help needed climbing 3-5 steps with a railing? : Total 6 Click Score: 15    End of Session Equipment Utilized During Treatment: Gait belt;Oxygen Activity Tolerance: Patient limited by pain;Patient tolerated treatment well Patient left: in chair;with call bell/phone within reach;with chair alarm set;with family/visitor present   PT Visit Diagnosis: Other abnormalities of gait and mobility (R26.89);Unsteadiness on feet (R26.81);Difficulty in walking, not elsewhere classified (R26.2);Pain Pain - Right/Left: Left Pain - part of body: Knee     Time: 2130-8657 PT Time Calculation (min) (ACUTE ONLY): 43 min  Charges:    $Gait Training: 8-22 mins $Therapeutic Exercise: 8-22 mins $Therapeutic Activity: 8-22 mins PT General Charges $$ ACUTE PT VISIT: 1 Visit                         Faye Ramsay, PT Acute Rehabilitation  Office: [phone]

## 2023-04-15 NOTE — Progress Notes (Signed)
Subjective: 1 Day Post-Op Procedure(s) (LRB): TOTAL KNEE ARTHROPLASTY (Left) Patient reports pain as moderate.   Patient seen in rounds by Dr. Lequita Halt. Patient is having issues with pain control. No issues overnight. Denies chest pain, SOB, or calf pain.  We will continue therapy today  Objective: Vital signs in last 24 hours: Temp:  [97.7 F (36.5 C)-98.6 F (37 C)] 97.8 F (36.6 C) (02/18 0226) Pulse Rate:  [58-78] 66 (02/18 0226) Resp:  [11-16] 16 (02/18 0226) BP: (93-148)/(52-89) 102/52 (02/18 0226) SpO2:  [91 %-98 %] 92 % (02/18 0226) Weight:  [71.7 kg] 71.7 kg (02/17 0938)  Intake/Output from previous day:  Intake/Output Summary (Last 24 hours) at 04/15/2023 0829 Last data filed at 04/15/2023 0800 Gross per 24 hour  Intake 4769.85 ml  Output 1375 ml  Net 3394.85 ml     Intake/Output this shift: Total I/O In: -  Out: 400 [Urine:400]  Labs: Recent Labs    04/15/23 0352  HGB 9.2*   Recent Labs    04/15/23 0352  WBC 5.9  RBC 3.23*  HCT 29.5*  PLT 146*   Recent Labs    04/15/23 0352  NA 139  K 4.3  CL 106  CO2 25  BUN 20  CREATININE 0.95  GLUCOSE 120*  CALCIUM 8.8*   No results for input(s): "LABPT", "INR" in the last 72 hours.  Exam: General - Patient is Alert and Oriented Extremity - Neurologically intact Neurovascular intact Sensation intact distally Dorsiflexion/Plantar flexion intact Dressing - dressing C/D/I Motor Function - intact, moving foot and toes well on exam.   Past Medical History:  Diagnosis Date   Allergic rhinitis    Anxiety    Benzodiazepine dependence (HCC)    Bruxism    CAD (coronary artery disease)    S/p NSTEMI 01/2018 >> LHC: oLAD 20, pLAD 20; oD1 20, oLCx 40, pRCA 99 >> PCI: DES to prox RCA // Echo 01/2018: EF 50-55; prob inf-lat and inf HK   Chronic diastolic heart failure    Echocardiogram 07/2018: EF 55-60, grade 1 diastolic dysfunction, normal wall motion, normal GLS (-22.3), PASP 28   Chronic foot pain     bunions, torn ligaments-on chronic pain medication   CTS (carpal tunnel syndrome) 06/08/2014   Depression    High cholesterol    Hypertension    Low blood potassium    MDD (major depressive disorder) 10/02/2017   Myocardial infarction (HCC) 02/01/2018   Pneumonia 1986   left lung   RA (rheumatoid arthritis) (HCC)     Assessment/Plan: 1 Day Post-Op Procedure(s) (LRB): TOTAL KNEE ARTHROPLASTY (Left) Principal Problem:   OA (osteoarthritis) of knee Active Problems:   Primary osteoarthritis of left knee  Estimated body mass index is 27.99 kg/m as calculated from the following:   Height as of this encounter: 5\' 3"  (1.6 m).   Weight as of this encounter: 71.7 kg. Advance diet Up with therapy D/C IV fluids   Patient's anticipated LOS is less than 2 midnights, meeting these requirements: - Younger than 71 - Lives within 1 hour of care - Has a competent adult at home to recover with post-op recover - NO history of  - Chronic pain requiring opiods  - Diabetes  - Coronary Artery Disease  - Heart failure  - Heart attack  - Stroke  - DVT/VTE  - Cardiac arrhythmia  - Respiratory Failure/COPD  - Renal failure  - Anemia  - Advanced Liver disease     DVT Prophylaxis - Aspirin  and Plavix Weight bearing as tolerated. Continue therapy.  Had issues with pain control yesterday, on chronic medication. Will make adjustments this AM.  Plan is to go Home after hospital stay. Possible discharge later today if pain controlled and meeting goals. Scheduled for OPPT at [facility]. Follow-up in the office in 2 weeks.  The PDMP database was reviewed today prior to any opioid medications being prescribed to this patient.  Arther Abbott, PA-C Orthopedic Surgery [phone] 04/15/2023, 8:29 AM

## 2023-04-16 ENCOUNTER — Other Ambulatory Visit: Payer: Self-pay | Admitting: Internal Medicine

## 2023-04-16 ENCOUNTER — Other Ambulatory Visit (HOSPITAL_COMMUNITY): Payer: Self-pay

## 2023-04-16 DIAGNOSIS — F332 Major depressive disorder, recurrent severe without psychotic features: Secondary | ICD-10-CM | POA: Diagnosis not present

## 2023-04-16 DIAGNOSIS — F17210 Nicotine dependence, cigarettes, uncomplicated: Secondary | ICD-10-CM | POA: Diagnosis not present

## 2023-04-16 DIAGNOSIS — F1320 Sedative, hypnotic or anxiolytic dependence, uncomplicated: Secondary | ICD-10-CM | POA: Diagnosis not present

## 2023-04-16 DIAGNOSIS — F411 Generalized anxiety disorder: Secondary | ICD-10-CM | POA: Diagnosis not present

## 2023-04-16 DIAGNOSIS — M1712 Unilateral primary osteoarthritis, left knee: Secondary | ICD-10-CM | POA: Diagnosis not present

## 2023-04-16 DIAGNOSIS — Z955 Presence of coronary angioplasty implant and graft: Secondary | ICD-10-CM | POA: Diagnosis not present

## 2023-04-16 DIAGNOSIS — I110 Hypertensive heart disease with heart failure: Secondary | ICD-10-CM | POA: Diagnosis not present

## 2023-04-16 DIAGNOSIS — I2510 Atherosclerotic heart disease of native coronary artery without angina pectoris: Secondary | ICD-10-CM | POA: Diagnosis not present

## 2023-04-16 DIAGNOSIS — Z7902 Long term (current) use of antithrombotics/antiplatelets: Secondary | ICD-10-CM | POA: Diagnosis not present

## 2023-04-16 DIAGNOSIS — F1121 Opioid dependence, in remission: Secondary | ICD-10-CM | POA: Diagnosis not present

## 2023-04-16 DIAGNOSIS — I5032 Chronic diastolic (congestive) heart failure: Secondary | ICD-10-CM | POA: Diagnosis not present

## 2023-04-16 DIAGNOSIS — Z79899 Other long term (current) drug therapy: Secondary | ICD-10-CM | POA: Diagnosis not present

## 2023-04-16 LAB — CBC
HCT: 26.2 % — ABNORMAL LOW (ref 36.0–46.0)
HCT: 27.5 % — ABNORMAL LOW (ref 36.0–46.0)
Hemoglobin: 8.2 g/dL — ABNORMAL LOW (ref 12.0–15.0)
Hemoglobin: 8.9 g/dL — ABNORMAL LOW (ref 12.0–15.0)
MCH: 28.8 pg (ref 26.0–34.0)
MCH: 28.9 pg (ref 26.0–34.0)
MCHC: 31.3 g/dL (ref 30.0–36.0)
MCHC: 32.4 g/dL (ref 30.0–36.0)
MCV: 91.9 fL (ref 80.0–100.0)
MCV: 89.3 fL (ref 80.0–100.0)
Platelets: 122 10*3/uL — ABNORMAL LOW (ref 150–400)
Platelets: 154 10*3/uL (ref 150–400)
RBC: 2.85 MIL/uL — ABNORMAL LOW (ref 3.87–5.11)
RBC: 3.08 MIL/uL — ABNORMAL LOW (ref 3.87–5.11)
RDW: 13 % (ref 11.5–15.5)
RDW: 13.1 % (ref 11.5–15.5)
WBC: 4.9 10*3/uL (ref 4.0–10.5)
WBC: 6.3 10*3/uL (ref 4.0–10.5)
nRBC: 0 % (ref 0.0–0.2)
nRBC: 0 % (ref 0.0–0.2)

## 2023-04-16 MED ORDER — HYDROMORPHONE HCL 2 MG PO TABS
2.0000 mg | ORAL_TABLET | Freq: Four times a day (QID) | ORAL | 0 refills | Status: DC | PRN
  Filled 2023-04-16: qty 42, 6d supply, fill #0

## 2023-04-16 NOTE — Progress Notes (Signed)
   Subjective: 2 Days Post-Op Procedure(s) (LRB): TOTAL KNEE ARTHROPLASTY (Left) Patient seen in rounds for Dr. Lequita Halt. Patient resting comfortably in bed this AM. Reports pain is improving with change to medications yesterday. Rates it a 6/10 today. Worsens when getting up and putting weight on it, which we discussed is normal. Denies SOB or chest pain. Denies calf pain. Denies lightheadedness or dizziness.  Objective: Vital signs in last 24 hours: Temp:  [97.8 F (36.6 C)-98.4 F (36.9 C)] 98.1 F (36.7 C) (02/19 0611) Pulse Rate:  [59-72] 59 (02/19 0611) Resp:  [15-18] 17 (02/19 0611) BP: (90-130)/(47-66) 103/53 (02/19 0611) SpO2:  [90 %-97 %] 93 % (02/19 0611)  Intake/Output from previous day:  Intake/Output Summary (Last 24 hours) at 04/16/2023 0846 Last data filed at 04/16/2023 0420 Gross per 24 hour  Intake 821.86 ml  Output 950 ml  Net -128.14 ml    Intake/Output this shift: No intake/output data recorded.  Labs: Recent Labs    04/15/23 0352 04/16/23 0418  HGB 9.2* 8.2*   Recent Labs    04/15/23 0352 04/16/23 0418  WBC 5.9 4.9  RBC 3.23* 2.85*  HCT 29.5* 26.2*  PLT 146* 122*   Recent Labs    04/15/23 0352  NA 139  K 4.3  CL 106  CO2 25  BUN 20  CREATININE 0.95  GLUCOSE 120*  CALCIUM 8.8*   No results for input(s): "LABPT", "INR" in the last 72 hours.  Exam: General - Patient is Alert and Oriented Extremity - Neurologically intact Neurovascular intact Sensation intact distally Dorsiflexion/Plantar flexion intact Dressing/Incision - clean, dry, no drainage Motor Function - intact, moving foot and toes well on exam.  Past Medical History:  Diagnosis Date   Allergic rhinitis    Anxiety    Benzodiazepine dependence (HCC)    Bruxism    CAD (coronary artery disease)    S/p NSTEMI 01/2018 >> LHC: oLAD 20, pLAD 20; oD1 20, oLCx 40, pRCA 99 >> PCI: DES to prox RCA // Echo 01/2018: EF 50-55; prob inf-lat and inf HK   Chronic diastolic heart  failure    Echocardiogram 07/2018: EF 55-60, grade 1 diastolic dysfunction, normal wall motion, normal GLS (-22.3), PASP 28   Chronic foot pain    bunions, torn ligaments-on chronic pain medication   CTS (carpal tunnel syndrome) 06/08/2014   Depression    High cholesterol    Hypertension    Low blood potassium    MDD (major depressive disorder) 10/02/2017   Myocardial infarction (HCC) 02/01/2018   Pneumonia 1986   left lung   RA (rheumatoid arthritis) (HCC)     Assessment/Plan: 2 Days Post-Op Procedure(s) (LRB): TOTAL KNEE ARTHROPLASTY (Left) Principal Problem:   OA (osteoarthritis) of knee Active Problems:   Primary osteoarthritis of left knee  Estimated body mass index is 27.99 kg/m as calculated from the following:   Height as of this encounter: 5\' 3"  (1.6 m).   Weight as of this encounter: 71.7 kg.  DVT Prophylaxis - Aspirin and Plavix Weight-bearing as tolerated.  Hgb 8.2 this AM but patient remains asymptomatic. Will monitor. Plan to repeat CBC at lunch to make sure it remains stable.  Continue physical therapy while in hospital. Possible discharge today pending progress and pain management. Fortunately, pain has been improving and fairly well controlled on PO pain meds. Scheduled for OPPT at [facility], PA-C Orthopedic Surgery 04/16/2023, 8:46 AM

## 2023-04-16 NOTE — Plan of Care (Signed)
  Problem: Education: Goal: Knowledge of General Education information will improve Description: Including pain rating scale, medication(s)/side effects and non-pharmacologic comfort measures Outcome: Adequate for Discharge   

## 2023-04-16 NOTE — Progress Notes (Signed)
Physical Therapy Treatment Patient Details Name: Meghan Schwartz MRN: [id number] DOB: [birth year] Today's Date: 04/16/2023   History of Present Illness 73 yo female presents to therapy s/p L TKA on 2/17/2-25 due to failure of conservative measures. Pt PMH Includes but is not limited to: hypocalcemia, falls, HTN, AKI, chronic pain, dCHF, LE edema, CAD s/p stent, NSTEMI, angina, HLD, RA, depression, anxiety, tobacco abuse, HTN, and  benzodiazepine dependence.    PT Comments  POD # 2 am session AxO x 3 pleasant Lady who admits to "some" ST memory issues.  Supportive Spouse present during session.  Pt progressing well but she feels she is not.  Pt apprehensive about going home today.  Nervous/anxious requiring repeat "positive" directions.  Extra time was used to [employer] on mobility and safe handling tech using safety belt.  But honestly, pt does not require any physical asisstance, but she is nervous. Assisted OOB to amb. General bed mobility comments: pt self able to transition to EOB using belt to self guide LE off bed.  Performed slow but required NO physical assistance.  General transfer comment: 25% VC's on proper hand placement esp with stand to sit, pt was self able to rise OOB as well as perform self toilet transfer.  Pt able to don/doff her personal brief and perform self peri care standing.  Pain 6/10.  General Gait Details: Pt able to amb a functional distance to and from the bathroom then in hallway to and from the portable stairs.  Total distance 32 feet.  Educated Spouse on use of safety belt.  Slow but steady gait.  Minimal instructions needed for proper use as she was amb with a walker prior to surgery. General stair comments: with Spouse present, performed/educated on proper tech getting up/down 2 steps with walker (no rails)  Pt performed well and did not require any physical assist. Positioned in recliner and applied ICE. Will see pt again to perform/educate on her HEP.  Expect D/C  after second PT session.     If plan is discharge home, recommend the following: A little help with walking and/or transfers;A little help with bathing/dressing/bathroom;Assistance with cooking/housework;Assist for transportation;Help with stairs or ramp for entrance   Can travel by private vehicle        Equipment Recommendations  None recommended by PT    Recommendations for Other Services       Precautions / Restrictions Precautions Precautions: Fall;Knee Precaution/Restrictions Comments: no pillow under knee Restrictions Weight Bearing Restrictions Per Provider Order: No LLE Weight Bearing Per Provider Order: Weight bearing as tolerated     Mobility  Bed Mobility Overal bed mobility: Needs Assistance Bed Mobility: Supine to Sit     Supine to sit: Supervision, Modified independent (Device/Increase time)     General bed mobility comments: pt self able to transition to EOB using belt to self guide LE off bed.  Performed slow but required NO physical assistance.    Transfers Overall transfer level: Needs assistance Equipment used: Rolling walker (2 wheels) Transfers: Sit to/from Stand Sit to Stand: Supervision           General transfer comment: 25% VC's on proper hand placement esp with stand to sit, pt was self able to rise OOB as well as perform self toilet transfer.  Pt able to don/doff her personal brief and perform self peri care standing.  Pain 6/10    Ambulation/Gait Ambulation/Gait assistance: Supervision Gait Distance (Feet): 32 Feet Assistive device: Rolling walker (2 wheels) Gait Pattern/deviations:  Step-to pattern, Antalgic, Trunk flexed Gait velocity: decreased     General Gait Details: Pt able to amb a functional distance to and from the bathroom then in hallway to and from the portable stairs.  Total distance 32 feet.  Educated Spouse on use of safety belt.  Slow but steady gait.  Minimal instructions needed for proper use as she was amb with a  walker prior to surgery.   Stairs Stairs: Yes Stairs assistance: Min assist Stair Management: No rails, Step to pattern, Forwards, With walker Number of Stairs: 2 General stair comments: with Spouse present, performed/educated on proper tech getting up/down 2 steps with walker (no rails)  Pt performed well and did not require any physical assist.   Wheelchair Mobility     Tilt Bed    Modified Rankin (Stroke Patients Only)       Balance                                            Communication Communication Communication: No apparent difficulties  Cognition Arousal: Alert Behavior During Therapy: WFL for tasks assessed/performed   PT - Cognitive impairments: No apparent impairments                       PT - Cognition Comments: AxO x 3 pleasant Lady who admits to "some" ST memory issues.  Supportive Spouse present during session.  Pt progressing well but she feels she is not.  Pt apprehensive about going home today.  Nervous/anxious requiring repeat "positive" directions.  Extra time was used to [employer] on mobility and safe handling tech using safety belt.  But honestly, pt does not require any physical asisstance, but she is nervous. Following commands: Intact      Cueing    Exercises      General Comments        Pertinent Vitals/Pain Pain Assessment Pain Assessment: 0-10 Pain Score: 6  Pain Location: L knee with activity Pain Descriptors / Indicators: Aching, Discomfort, Grimacing, Operative site guarding, Sore Pain Intervention(s): Premedicated before session, Repositioned, Ice applied, Monitored during session    Home Living                          Prior Function            PT Goals (current goals can now be found in the care plan section) Progress towards PT goals: Progressing toward goals    Frequency    7X/week      PT Plan      Co-evaluation              AM-PAC PT "6 Clicks" Mobility    Outcome Measure  Help needed turning from your back to your side while in a flat bed without using bedrails?: None Help needed moving from lying on your back to sitting on the side of a flat bed without using bedrails?: None Help needed moving to and from a bed to a chair (including a wheelchair)?: None Help needed standing up from a chair using your arms (e.g., wheelchair or bedside chair)?: None Help needed to walk in hospital room?: A Little Help needed climbing 3-5 steps with a railing? : A Little 6 Click Score: 22    End of Session Equipment Utilized During Treatment: Gait belt Activity Tolerance: Patient tolerated  treatment well Patient left: in chair;with call bell/phone within reach;with chair alarm set;with family/visitor present Nurse Communication: Mobility status PT Visit Diagnosis: Other abnormalities of gait and mobility (R26.89);Unsteadiness on feet (R26.81);Difficulty in walking, not elsewhere classified (R26.2);Pain Pain - Right/Left: Left Pain - part of body: Knee     Time: 1610-9604 PT Time Calculation (min) (ACUTE ONLY): 41 min  Charges:    $Gait Training: 23-37 mins $Therapeutic Activity: 8-22 mins PT General Charges $$ ACUTE PT VISIT: 1 Visit                     {Caroline Matters  PTA Acute  Rehabilitation Services Office M-F          [phone]

## 2023-04-16 NOTE — TOC Transition Note (Signed)
Transition of Care [facility]) - Discharge Note   Patient Details  Name: Meghan Schwartz MRN: [id number] Date of Birth: [birth year]  [facility] of [facility]) CM/SW Contact:  Howell Rucks, RN Phone Number: 04/16/2023, 10:41 AM   Clinical Narrative:   Met with pt and spouse at bedside, requesting BSC. Call to [employer], rep-Zach, confirmed able to accept and deliver to bedside prior to discharge today. Request sent to Ortho PA to enter order and sign medical necessity progress note.     Final next level of care: OP Rehab Barriers to Discharge: No Barriers Identified   Patient Goals and CMS Choice Patient states their goals for this hospitalization and ongoing recovery are:: return home          Discharge Placement                       Discharge Plan and Services Additional resources added to the After Visit Summary for                  DME Arranged: N/A DME Agency: NA       HH Arranged: NA HH Agency: NA        Social Drivers of Health (SDOH) Interventions SDOH Screenings   Food Insecurity: No Food Insecurity (04/14/2023)  Housing: Patient Declined (04/14/2023)  Transportation Needs: Patient Declined (04/14/2023)  Utilities: Patient Declined (04/14/2023)  Alcohol Screen: Low Risk  (10/02/2017)  Social Connections: Patient Declined (04/14/2023)  Tobacco Use: High Risk (04/14/2023)     Readmission Risk Interventions     No data to display

## 2023-04-16 NOTE — Care Management (Addendum)
The patient requires a bedside commode as she  is confined to one level of the home and there is no toilet on that level

## 2023-04-16 NOTE — Care Management (Signed)
    Durable Medical Equipment  (From admission, onward)           Start     Ordered   04/16/23 1044  For home use only DME 3 n 1  Once        04/16/23 1043

## 2023-04-16 NOTE — Progress Notes (Signed)
Physical Therapy Treatment Patient Details Name: Meghan Schwartz MRN: [id number] DOB: Feb 05, 1951 Today's Date: 04/16/2023   History of Present Illness 73 yo female presents to therapy s/p L TKA on 2/17/2-25 due to failure of conservative measures. Pt PMH Includes but is not limited to: hypocalcemia, falls, HTN, AKI, chronic pain, dCHF, LE edema, CAD s/p stent, NSTEMI, angina, HLD, RA, depression, anxiety, tobacco abuse, HTN, and  benzodiazepine dependence.    PT Comments  POD # 2 pm session PT - Cognition Comments: AxO x 3 pleasant Lady who admits to "some" ST memory issues and required repeat instructions.  Pt appears somewhat overwhelmed.  Anxious/nervous.  Very support Spouse at bed side.  Extra time spent to Educate both on her HEP.  Pt performing well but she feels she is "getting different information from different people".  Pt is performing and progress well to D/C to home today with Spouse.   Extended Tx time performing and Education pt on her HEP.  Handout also given.  Spouse following as well and taking notes.  Pt tolerated all her TE's.  Applied ICE.    Addressed all mobility questions, discussed appropriate activity, educated on use of ICE.  Pt ready for D/C to home. Pt waiting on [facility] delivery and Medication delivery to room.        If plan is discharge home, recommend the following: A little help with walking and/or transfers;A little help with bathing/dressing/bathroom;Assistance with cooking/housework;Assist for transportation;Help with stairs or ramp for entrance   Can travel by private vehicle        Equipment Recommendations  None recommended by PT    Recommendations for Other Services       Precautions / Restrictions Precautions Precautions: Fall;Knee Precaution/Restrictions Comments: no pillow under knee Restrictions Weight Bearing Restrictions Per Provider Order: No LLE Weight Bearing Per Provider Order: Weight bearing as tolerated     Mobility  Bed  Mobility Overal bed mobility: Needs Assistance Bed Mobility: Supine to Sit     Supine to sit: Supervision, Modified independent (Device/Increase time)     General bed mobility comments: pt self able to transition to EOB using belt to self guide LE off bed.  Performed slow but required NO physical assistance.    Transfers Overall transfer level: Needs assistance Equipment used: Rolling walker (2 wheels) Transfers: Sit to/from Stand Sit to Stand: Supervision           General transfer comment: 25% VC's on proper hand placement esp with stand to sit, pt was self able to rise OOB as well as perform self toilet transfer.  Pt able to don/doff her personal brief and perform self peri care standing.  Pain 6/10    Ambulation/Gait Ambulation/Gait assistance: Supervision Gait Distance (Feet): 32 Feet Assistive device: Rolling walker (2 wheels) Gait Pattern/deviations: Step-to pattern, Antalgic, Trunk flexed Gait velocity: decreased     General Gait Details: Pt able to amb a functional distance to and from the bathroom then in hallway to and from the portable stairs.  Total distance 32 feet.  Educated Spouse on use of safety belt.  Slow but steady gait.  Minimal instructions needed for proper use as she was amb with a walker prior to surgery.   Stairs Stairs: Yes Stairs assistance: Min assist Stair Management: No rails, Step to pattern, Forwards, With walker Number of Stairs: 2 General stair comments: with Spouse present, performed/educated on proper tech getting up/down 2 steps with walker (no rails)  Pt performed well and did not  require any physical assist.   Wheelchair Mobility     Tilt Bed    Modified Rankin (Stroke Patients Only)       Balance                                            Communication Communication Communication: No apparent difficulties  Cognition Arousal: Alert Behavior During Therapy: WFL for tasks assessed/performed   PT -  Cognitive impairments: No apparent impairments                       PT - Cognition Comments: AxO x 3 pleasant Lady who admits to "some" ST memory issues and required repeat instructions.  Very support Spouse at bed side.  Extra time spent to Educate both on her HEP.  Pt performing well but she feels she is "getting different information from different people".  Pt is performing and progress well to D/C to home today with Spouse. Following commands: Intact      Cueing    Exercises  Total Knee Replacement TE's following HEP handout 10 reps B LE ankle pumps 05 reps towel squeezes 05 reps knee presses 05 reps heel slides  05 reps SAQ's 05 reps SLR's 05 reps ABD 05 reps all seated TE's Educated on use of gait belt to assist with TE's Followed by ICE     General Comments        Pertinent Vitals/Pain Pain Assessment Pain Assessment: 0-10 Pain Score: 6  Pain Location: L knee with activity Pain Descriptors / Indicators: Aching, Discomfort, Grimacing, Operative site guarding, Sore Pain Intervention(s): Premedicated before session, Repositioned, Ice applied, Monitored during session    Home Living                          Prior Function            PT Goals (current goals can now be found in the care plan section) Progress towards PT goals: Progressing toward goals    Frequency    7X/week      PT Plan      Co-evaluation              AM-PAC PT "6 Clicks" Mobility   Outcome Measure  Help needed turning from your back to your side while in a flat bed without using bedrails?: None Help needed moving from lying on your back to sitting on the side of a flat bed without using bedrails?: None Help needed moving to and from a bed to a chair (including a wheelchair)?: None Help needed standing up from a chair using your arms (e.g., wheelchair or bedside chair)?: None Help needed to walk in hospital room?: A Little Help needed climbing 3-5 steps with  a railing? : A Little 6 Click Score: 22    End of Session Equipment Utilized During Treatment: Gait belt Activity Tolerance: Patient tolerated treatment well Patient left: in chair;with call bell/phone within reach;with chair alarm set;with family/visitor present Nurse Communication: Mobility status PT Visit Diagnosis: Other abnormalities of gait and mobility (R26.89);Unsteadiness on feet (R26.81);Difficulty in walking, not elsewhere classified (R26.2);Pain Pain - Right/Left: Left Pain - part of body: Knee     Time: 1610-9604 PT Time Calculation (min) (ACUTE ONLY): 24 min  Charges:    $Gait Training: 23-37 mins $Therapeutic Exercise: 8-22 mins $Therapeutic  Activity: 8-22 mins $Self Care/Home Management: 8-22 PT General Charges $$ ACUTE PT VISIT: 1 Visit                     Felecia Shelling  PTA Acute  Rehabilitation Services Office M-F          [phone]

## 2023-04-18 NOTE — Discharge Summary (Signed)
Physician Discharge Summary   Patient ID: Meghan Schwartz MRN: [id number] DOB/AGE: 74-Aug-1952 74 y.o.  Admit date: 04/14/2023 Discharge date: 04/16/2023  Primary Diagnosis: Osteoarthritis left knee    Admission Diagnoses:  Past Medical History:  Diagnosis Date   Allergic rhinitis    Anxiety    Benzodiazepine dependence (HCC)    Bruxism    CAD (coronary artery disease)    S/p NSTEMI 01/2018 >> LHC: oLAD 20, pLAD 20; oD1 20, oLCx 40, pRCA 99 >> PCI: DES to prox RCA // Echo 01/2018: EF 50-55; prob inf-lat and inf HK   Chronic diastolic heart failure    Echocardiogram 07/2018: EF 55-60, grade 1 diastolic dysfunction, normal wall motion, normal GLS (-22.3), PASP 28   Chronic foot pain    bunions, torn ligaments-on chronic pain medication   CTS (carpal tunnel syndrome) 06/08/2014   Depression    High cholesterol    Hypertension    Low blood potassium    MDD (major depressive disorder) 10/02/2017   Myocardial infarction (HCC) 02/01/2018   Pneumonia 1986   left lung   RA (rheumatoid arthritis) (HCC)    Discharge Diagnoses:   Principal Problem:   OA (osteoarthritis) of knee Active Problems:   Primary osteoarthritis of left knee  Estimated body mass index is 27.99 kg/m as calculated from the following:   Height as of this encounter: 5\' 3"  (1.6 m).   Weight as of this encounter: 71.7 kg.  Procedure:  Procedure(s) (LRB): TOTAL KNEE ARTHROPLASTY (Left)   Consults: None  HPI: Meghan Schwartz is a 73 y.o. year old female with end stage OA of her left knee with progressively worsening pain and dysfunction. She has constant pain, with activity and at rest and significant functional deficits with difficulties even with ADLs. She has had extensive non-op management including analgesics, injections of cortisone and viscosupplements, and home exercise program, but remains in significant pain with significant dysfunction. Radiographs show bone on bone arthritis lateral and patellofemoral with  significant valgus deformity. She presents now for left Total Knee Arthroplasty.   Laboratory Data: Admission on 04/14/2023, Discharged on 04/16/2023  Component Date Value Ref Range Status   WBC 04/15/2023 5.9  4.0 - 10.5 K/uL Final   RBC 04/15/2023 3.23 (L)  3.87 - 5.11 MIL/uL Final   Hemoglobin 04/15/2023 9.2 (L)  12.0 - 15.0 g/dL Final   HCT 29/51/8841 29.5 (L)  36.0 - 46.0 % Final   MCV 04/15/2023 91.3  80.0 - 100.0 fL Final   MCH 04/15/2023 28.5  26.0 - 34.0 pg Final   MCHC 04/15/2023 31.2  30.0 - 36.0 g/dL Final   RDW 66/07/3014 12.7  11.5 - 15.5 % Final   Platelets 04/15/2023 146 (L)  150 - 400 K/uL Final   nRBC 04/15/2023 0.0  0.0 - 0.2 % Final   Performed at [facility], [address] [place], [place] [address]   Sodium 04/15/2023 139  135 - 145 mmol/L Final   Potassium 04/15/2023 4.3  3.5 - 5.1 mmol/L Final   Chloride 04/15/2023 106  98 - 111 mmol/L Final   CO2 04/15/2023 25  22 - 32 mmol/L Final   Glucose, Bld 04/15/2023 120 (H)  70 - 99 mg/dL Final   Glucose reference range applies only to samples taken after fasting for at least 8 hours.   BUN 04/15/2023 20  8 - 23 mg/dL Final   Creatinine, Ser 04/15/2023 0.95  0.44 - 1.00 mg/dL Final   Calcium 23/55/7322  8.8 (L)  8.9 - 10.3 mg/dL Final   GFR, Estimated 04/15/2023 >60  >60 mL/min Final   Comment: (NOTE) Calculated using the CKD-EPI Creatinine Equation (2021)    Anion gap 04/15/2023 8  5 - 15 Final   Performed at [facility], [address] [place], [place] [address]   WBC 04/16/2023 4.9  4.0 - 10.5 K/uL Final   RBC 04/16/2023 2.85 (L)  3.87 - 5.11 MIL/uL Final   Hemoglobin 04/16/2023 8.2 (L)  12.0 - 15.0 g/dL Final   HCT 60/45/4098 26.2 (L)  36.0 - 46.0 % Final   MCV 04/16/2023 91.9  80.0 - 100.0 fL Final   MCH 04/16/2023 28.8  26.0 - 34.0 pg Final   MCHC 04/16/2023 31.3  30.0 - 36.0 g/dL Final   RDW 11/91/4782 13.0  11.5 - 15.5 % Final   Platelets 04/16/2023 122 (L)  150  - 400 K/uL Final   nRBC 04/16/2023 0.0  0.0 - 0.2 % Final   Performed at [facility], [address] [place], [place] [address]   WBC 04/16/2023 6.3  4.0 - 10.5 K/uL Final   RBC 04/16/2023 3.08 (L)  3.87 - 5.11 MIL/uL Final   Hemoglobin 04/16/2023 8.9 (L)  12.0 - 15.0 g/dL Final   HCT 30/86/5784 27.5 (L)  36.0 - 46.0 % Final   MCV 04/16/2023 89.3  80.0 - 100.0 fL Final   MCH 04/16/2023 28.9  26.0 - 34.0 pg Final   MCHC 04/16/2023 32.4  30.0 - 36.0 g/dL Final   RDW 69/62/9528 13.1  11.5 - 15.5 % Final   Platelets 04/16/2023 154  150 - 400 K/uL Final   nRBC 04/16/2023 0.0  0.0 - 0.2 % Final   Performed at [facility], [address], [place], [place] [address]  Hospital Outpatient Visit on 04/01/2023  Component Date Value Ref Range Status   MRSA, PCR 04/01/2023 NEGATIVE  NEGATIVE Final   Staphylococcus aureus 04/01/2023 NEGATIVE  NEGATIVE Final   Comment: (NOTE) The Xpert SA Assay (FDA approved for NASAL specimens in patients 36 years of age and older), is one component of a comprehensive surveillance program. It is not intended to diagnose infection nor to guide or monitor treatment. Performed at [facility], [address] [place], [place] [address]   Office Visit on 03/19/2023  Component Date Value Ref Range Status   WBC 03/19/2023 3.5 (L)  3.8 - 10.8 Thousand/uL Final   RBC 03/19/2023 4.13  3.80 - 5.10 Million/uL Final   Hemoglobin 03/19/2023 11.9  11.7 - 15.5 g/dL Final   HCT 72/53/6644 35.8  35.0 - 45.0 % Final   MCV 03/19/2023 86.7  80.0 - 100.0 fL Final   MCH 03/19/2023 28.8  27.0 - 33.0 pg Final   MCHC 03/19/2023 33.2  32.0 - 36.0 g/dL Final   Comment: For adults, a slight decrease in the calculated MCHC value (in the range of 30 to 32 g/dL) is most likely not clinically significant; however, it should be interpreted with caution in correlation with other red cell parameters and the patient's  clinical condition.    RDW 03/19/2023 12.5  11.0 - 15.0 % Final   Platelets 03/19/2023 156  140 - 400 Thousand/uL Final   MPV 03/19/2023 9.5  7.5 - 12.5 fL Final   Neutro Abs 03/19/2023 1,705  1,500 - 7,800 cells/uL Final   Absolute Lymphocytes 03/19/2023 1,106  850 - 3,900 cells/uL Final   Absolute Monocytes  03/19/2023 406  200 - 950 cells/uL Final   Eosinophils Absolute 03/19/2023 252  15 - 500 cells/uL Final   Basophils Absolute 03/19/2023 32  0 - 200 cells/uL Final   Neutrophils Relative % 03/19/2023 48.7  % Final   Total Lymphocyte 03/19/2023 31.6  % Final   Monocytes Relative 03/19/2023 11.6  % Final   Eosinophils Relative 03/19/2023 7.2  % Final   Basophils Relative 03/19/2023 0.9  % Final   Glucose, Bld 03/19/2023 99  65 - 99 mg/dL Final   Comment: .            Fasting reference interval .    BUN 03/19/2023 19  7 - 25 mg/dL Final   Creat 16/11/9602 0.89  0.60 - 1.00 mg/dL Final   eGFR 54/10/8117 68  > OR = 60 mL/min/1.69m2 Final   BUN/Creatinine Ratio 03/19/2023 SEE NOTE:  6 - 22 (calc) Final   Comment:    Not Reported: BUN and Creatinine are within    reference range. .    Sodium 03/19/2023 143  135 - 146 mmol/L Final   Potassium 03/19/2023 5.3  3.5 - 5.3 mmol/L Final   Chloride 03/19/2023 104  98 - 110 mmol/L Final   CO2 03/19/2023 33 (H)  20 - 32 mmol/L Final   Calcium 03/19/2023 10.0  8.6 - 10.4 mg/dL Final   Total Protein 14/78/2956 7.0  6.1 - 8.1 g/dL Final   Albumin 21/30/8657 4.2  3.6 - 5.1 g/dL Final   Globulin 84/69/6295 2.8  1.9 - 3.7 g/dL (calc) Final   AG Ratio 03/19/2023 1.5  1.0 - 2.5 (calc) Final   Total Bilirubin 03/19/2023 0.8  0.2 - 1.2 mg/dL Final   Alkaline phosphatase (APISO) 03/19/2023 84  37 - 153 U/L Final   AST 03/19/2023 23  10 - 35 U/L Final   ALT 03/19/2023 16  6 - 29 U/L Final   Sed Rate 03/19/2023 17  0 - 30 mm/h Final     X-Rays:No results found.  EKG: Orders placed or performed in visit on 02/27/23   EKG 12-Lead     Hospital  Course: DAMARIS ABELN is a 73 y.o. who was admitted to [facility]. They were brought to the operating room on 04/14/2023 and underwent Procedure(s): TOTAL KNEE ARTHROPLASTY.  Patient tolerated the procedure well and was later transferred to the recovery room and then to the orthopaedic floor for postoperative care. They were given PO and IV analgesics for pain control following their surgery. They were given 24 hours of postoperative antibiotics of  Anti-infectives (From admission, onward)    Start     Dose/Rate Route Frequency Ordered Stop   04/14/23 1730  ceFAZolin (ANCEF) IVPB 2g/100 mL premix        2 g 200 mL/hr over 30 Minutes Intravenous Every 6 hours 04/14/23 1429 04/15/23 0030   04/14/23 0945  ceFAZolin (ANCEF) IVPB 2g/100 mL premix        2 g 200 mL/hr over 30 Minutes Intravenous On call to O.R. 04/14/23 2841 04/14/23 1208      and started on DVT prophylaxis in the form of Aspirin and Plavix.  PT and OT were ordered for total joint protocol. Discharge planning consulted to help with post-op disposition and equipment needs. Patient had a fair night on the evening of surgery. They started to get up OOB with physical therapy on POD #0. Continued to work with physical therapy into POD #2. Patient was seen during rounds  on day two and was ready to go home pending progress with physical therapy. Patient worked with physical therapy for a total of 5 sessions and was meeting their goals. Dressing was changed and the incision was C/D/I.  They were discharged home later that day in stable condition.  Diet: Cardiac diet Activity: WBAT Follow-up: in 2 weeks Disposition: Home Discharged Condition: stable   Discharge Instructions     Call MD / Call 911   Complete by: As directed    If you experience chest pain or shortness of breath, CALL 911 and be transported to the hospital emergency room.  If you develope a fever above 101 F, pus (white drainage) or increased drainage or redness at  the wound, or calf pain, call your surgeon's office.   Change dressing   Complete by: As directed    You may remove the bulky bandage (ACE wrap and gauze) two days after surgery. You will have an adhesive waterproof bandage underneath. Leave this in place until your first follow-up appointment.   Constipation Prevention   Complete by: As directed    Drink plenty of fluids.  Prune juice may be helpful.  You may use a stool softener, such as Colace (over the counter) 100 mg twice a day.  Use MiraLax (over the counter) for constipation as needed.   Diet - low sodium heart healthy   Complete by: As directed    Do not put a pillow under the knee. Place it under the heel.   Complete by: As directed    Driving restrictions   Complete by: As directed    No driving for two weeks   Post-operative opioid taper instructions:   Complete by: As directed    POST-OPERATIVE OPIOID TAPER INSTRUCTIONS: It is important to wean off of your opioid medication as soon as possible. If you do not need pain medication after your surgery it is ok to stop day one. Opioids include: Codeine, Hydrocodone(Norco, Vicodin), Oxycodone(Percocet, oxycontin) and hydromorphone amongst others.  Long term and even short term use of opiods can cause: Increased pain response Dependence Constipation Depression Respiratory depression And more.  Withdrawal symptoms can include Flu like symptoms Nausea, vomiting And more Techniques to manage these symptoms Hydrate well Eat regular healthy meals Stay active Use relaxation techniques(deep breathing, meditating, yoga) Do Not substitute Alcohol to help with tapering If you have been on opioids for less than two weeks and do not have pain than it is ok to stop all together.  Plan to wean off of opioids This plan should start within one week post op of your joint replacement. Maintain the same interval or time between taking each dose and first decrease the dose.  Cut the total  daily intake of opioids by one tablet each day Next start to increase the time between doses. The last dose that should be eliminated is the evening dose.      TED hose   Complete by: As directed    Use stockings (TED hose) for three weeks on both leg(s).  You may remove them at night for sleeping.   Weight bearing as tolerated   Complete by: As directed       Allergies as of 04/16/2023       Reactions   Erythromycin Nausea And Vomiting   stomach upset        Medication List     STOP taking these medications    meloxicam 15 MG tablet Commonly known as: MOBIC  TAKE these medications    acetaminophen 500 MG tablet Commonly known as: TYLENOL Take 500 mg by mouth every 6 (six) hours as needed (For knee pain).   Align 4 MG Caps Take 4 mg by mouth daily.   aspirin EC 325 MG tablet Take 1 tablet (325 mg total) by mouth daily for 20 days. Then take one 81 mg aspirin once a day for three weeks. Then discontinue aspirin.   cetirizine 10 MG tablet Commonly known as: ZYRTEC Take 10 mg by mouth daily.   clonazePAM 1 MG tablet Commonly known as: KLONOPIN Take 1 tablet (1 mg total) by mouth at bedtime.   clopidogrel 75 MG tablet Commonly known as: PLAVIX TAKE 1 TABLET BY MOUTH EVERY DAY   [employer] Stool Softener 100 MG capsule Generic drug: docusate sodium Take 100 mg by mouth at bedtime.   Enbrel SureClick 50 MG/ML injection Generic drug: etanercept Inject 50 mg into the skin once a week.   escitalopram 10 MG tablet Commonly known as: LEXAPRO Take 15 mg by mouth every morning.   estradiol 0.075 MG/24HR Commonly known as: VIVELLE-DOT Place 1 patch onto the skin 2 (two) times a week. Tues. & Sat.   Horizant 600 MG Tbcr Generic drug: Gabapentin Enacarbil Take 1 tablet by mouth every evening. GABAPENTIN   HYDROmorphone 2 MG tablet Commonly known as: DILAUDID Take 1-2 tablets (2-4 mg total) by mouth every 6 (six) hours as needed for severe pain (pain score  7-10) (breakthrough pain).   methocarbamol 500 MG tablet Commonly known as: ROBAXIN Take 1 tablet (500 mg total) by mouth every 6 (six) hours as needed for muscle spasms.   metoprolol tartrate 25 MG tablet Commonly known as: LOPRESSOR TAKE 1 TABLET BY MOUTH EVERY DAY   multivitamin with minerals tablet Take 1 tablet by mouth daily. Woman 50+   nitroGLYCERIN 0.4 MG SL tablet Commonly known as: NITROSTAT Place 1 tablet (0.4 mg total) under the tongue every 5 (five) minutes as needed for chest pain.   ondansetron 4 MG disintegrating tablet Commonly known as: ZOFRAN-ODT Take by mouth daily.   ondansetron 4 MG tablet Commonly known as: ZOFRAN Take 1 tablet (4 mg total) by mouth every 6 (six) hours as needed for nausea.   potassium chloride SA 20 MEQ tablet Commonly known as: KLOR-CON M Take 1 tablet (20 mEq total) by mouth daily.   rosuvastatin 40 MG tablet Commonly known as: CRESTOR TAKE 1 TABLET BY MOUTH EVERY DAY   Theratears 0.25 % Soln Generic drug: Carboxymethylcellulose Sodium Place 2 drops into the left eye daily.   triamcinolone cream 0.1 % Commonly known as: KENALOG Apply 1 Application topically daily as needed (Itching skin).   Vitamin D 50 MCG (2000 UT) tablet Take 2,000 Units by mouth daily.   Zubsolv 5.7-1.4 MG Subl Generic drug: Buprenorphine HCl-Naloxone HCl Place 1 tablet under the tongue 3 (three) times daily.               Discharge Care Instructions  (From admission, onward)           Start     Ordered   04/15/23 0000  Weight bearing as tolerated        04/15/23 0832   04/15/23 0000  Change dressing       Comments: You may remove the bulky bandage (ACE wrap and gauze) two days after surgery. You will have an adhesive waterproof bandage underneath. Leave this in place until your first follow-up appointment.   04/15/23  1610            Follow-up Information     Ollen Gross, MD. Go on 04/29/2023.   Specialty: Orthopedic  Surgery Why: You are scheduled for a follow up appointment on 04-29-23 at 3:00 pm. Contact information: [address] [place] [address] [phone]         [facility] Follow up.   Why: [facility]   [facility] Contact information: [address] [place] [address] [phone]                 Signed: R. Arcola Jansky, PA-C Orthopedic Surgery 04/18/2023, 7:39 AM

## 2023-04-21 DIAGNOSIS — F1121 Opioid dependence, in remission: Secondary | ICD-10-CM | POA: Diagnosis not present

## 2023-04-21 DIAGNOSIS — F332 Major depressive disorder, recurrent severe without psychotic features: Secondary | ICD-10-CM | POA: Diagnosis not present

## 2023-04-21 DIAGNOSIS — F1320 Sedative, hypnotic or anxiolytic dependence, uncomplicated: Secondary | ICD-10-CM | POA: Diagnosis not present

## 2023-04-21 DIAGNOSIS — F411 Generalized anxiety disorder: Secondary | ICD-10-CM | POA: Diagnosis not present

## 2023-04-22 DIAGNOSIS — M25562 Pain in left knee: Secondary | ICD-10-CM | POA: Diagnosis not present

## 2023-04-23 ENCOUNTER — Other Ambulatory Visit (HOSPITAL_COMMUNITY): Payer: Self-pay

## 2023-04-23 MED ORDER — CYCLOBENZAPRINE HCL 10 MG PO TABS
10.0000 mg | ORAL_TABLET | Freq: Three times a day (TID) | ORAL | 0 refills | Status: DC | PRN
  Filled 2023-04-23: qty 30, 10d supply, fill #0

## 2023-04-23 MED ORDER — HYDROMORPHONE HCL 2 MG PO TABS
ORAL_TABLET | ORAL | 0 refills | Status: DC
  Filled 2023-04-23: qty 28, 7d supply, fill #0

## 2023-04-24 DIAGNOSIS — F1121 Opioid dependence, in remission: Secondary | ICD-10-CM | POA: Diagnosis not present

## 2023-04-24 DIAGNOSIS — F411 Generalized anxiety disorder: Secondary | ICD-10-CM | POA: Diagnosis not present

## 2023-04-24 DIAGNOSIS — F332 Major depressive disorder, recurrent severe without psychotic features: Secondary | ICD-10-CM | POA: Diagnosis not present

## 2023-04-24 DIAGNOSIS — F1320 Sedative, hypnotic or anxiolytic dependence, uncomplicated: Secondary | ICD-10-CM | POA: Diagnosis not present

## 2023-04-25 ENCOUNTER — Other Ambulatory Visit (HOSPITAL_COMMUNITY): Payer: Self-pay

## 2023-04-25 DIAGNOSIS — M25562 Pain in left knee: Secondary | ICD-10-CM | POA: Diagnosis not present

## 2023-04-25 MED ORDER — CEPHALEXIN 500 MG PO CAPS
500.0000 mg | ORAL_CAPSULE | Freq: Four times a day (QID) | ORAL | 0 refills | Status: DC
  Filled 2023-04-25: qty 20, 5d supply, fill #0

## 2023-04-28 ENCOUNTER — Other Ambulatory Visit: Payer: Self-pay

## 2023-04-28 ENCOUNTER — Other Ambulatory Visit (HOSPITAL_COMMUNITY): Payer: Self-pay

## 2023-04-28 DIAGNOSIS — M25562 Pain in left knee: Secondary | ICD-10-CM | POA: Diagnosis not present

## 2023-04-28 MED ORDER — HYDROMORPHONE HCL 2 MG PO TABS
2.0000 mg | ORAL_TABLET | ORAL | 0 refills | Status: DC | PRN
  Filled 2023-04-28 (×3): qty 28, 5d supply, fill #0

## 2023-04-28 MED ORDER — CYCLOBENZAPRINE HCL 10 MG PO TABS
10.0000 mg | ORAL_TABLET | Freq: Three times a day (TID) | ORAL | 0 refills | Status: DC | PRN
  Filled 2023-04-28 – 2023-05-02 (×3): qty 30, 10d supply, fill #0

## 2023-04-29 ENCOUNTER — Other Ambulatory Visit (HOSPITAL_COMMUNITY): Payer: Self-pay

## 2023-04-30 DIAGNOSIS — F411 Generalized anxiety disorder: Secondary | ICD-10-CM | POA: Diagnosis not present

## 2023-04-30 DIAGNOSIS — F1320 Sedative, hypnotic or anxiolytic dependence, uncomplicated: Secondary | ICD-10-CM | POA: Diagnosis not present

## 2023-04-30 DIAGNOSIS — F332 Major depressive disorder, recurrent severe without psychotic features: Secondary | ICD-10-CM | POA: Diagnosis not present

## 2023-04-30 DIAGNOSIS — F1121 Opioid dependence, in remission: Secondary | ICD-10-CM | POA: Diagnosis not present

## 2023-05-02 ENCOUNTER — Other Ambulatory Visit (HOSPITAL_COMMUNITY): Payer: Self-pay

## 2023-05-02 DIAGNOSIS — M25562 Pain in left knee: Secondary | ICD-10-CM | POA: Diagnosis not present

## 2023-05-06 ENCOUNTER — Other Ambulatory Visit (HOSPITAL_COMMUNITY): Payer: Self-pay

## 2023-05-06 MED ORDER — HYDROMORPHONE HCL 2 MG PO TABS
ORAL_TABLET | Freq: Two times a day (BID) | ORAL | 0 refills | Status: DC | PRN
  Filled 2023-05-06: qty 14, 3d supply, fill #0

## 2023-05-07 DIAGNOSIS — F1320 Sedative, hypnotic or anxiolytic dependence, uncomplicated: Secondary | ICD-10-CM | POA: Diagnosis not present

## 2023-05-07 DIAGNOSIS — M25562 Pain in left knee: Secondary | ICD-10-CM | POA: Diagnosis not present

## 2023-05-07 DIAGNOSIS — F1121 Opioid dependence, in remission: Secondary | ICD-10-CM | POA: Diagnosis not present

## 2023-05-07 DIAGNOSIS — F411 Generalized anxiety disorder: Secondary | ICD-10-CM | POA: Diagnosis not present

## 2023-05-07 DIAGNOSIS — F332 Major depressive disorder, recurrent severe without psychotic features: Secondary | ICD-10-CM | POA: Diagnosis not present

## 2023-05-13 DIAGNOSIS — M25562 Pain in left knee: Secondary | ICD-10-CM | POA: Diagnosis not present

## 2023-05-14 DIAGNOSIS — F1320 Sedative, hypnotic or anxiolytic dependence, uncomplicated: Secondary | ICD-10-CM | POA: Diagnosis not present

## 2023-05-14 DIAGNOSIS — F1121 Opioid dependence, in remission: Secondary | ICD-10-CM | POA: Diagnosis not present

## 2023-05-14 DIAGNOSIS — F332 Major depressive disorder, recurrent severe without psychotic features: Secondary | ICD-10-CM | POA: Diagnosis not present

## 2023-05-14 DIAGNOSIS — F411 Generalized anxiety disorder: Secondary | ICD-10-CM | POA: Diagnosis not present

## 2023-05-16 DIAGNOSIS — M25562 Pain in left knee: Secondary | ICD-10-CM | POA: Diagnosis not present

## 2023-05-18 ENCOUNTER — Other Ambulatory Visit: Payer: Self-pay | Admitting: Internal Medicine

## 2023-05-19 DIAGNOSIS — F1121 Opioid dependence, in remission: Secondary | ICD-10-CM | POA: Diagnosis not present

## 2023-05-19 DIAGNOSIS — F332 Major depressive disorder, recurrent severe without psychotic features: Secondary | ICD-10-CM | POA: Diagnosis not present

## 2023-05-19 DIAGNOSIS — F1320 Sedative, hypnotic or anxiolytic dependence, uncomplicated: Secondary | ICD-10-CM | POA: Diagnosis not present

## 2023-05-19 DIAGNOSIS — F411 Generalized anxiety disorder: Secondary | ICD-10-CM | POA: Diagnosis not present

## 2023-05-20 ENCOUNTER — Other Ambulatory Visit (HOSPITAL_COMMUNITY): Payer: Self-pay

## 2023-05-20 DIAGNOSIS — M25562 Pain in left knee: Secondary | ICD-10-CM | POA: Diagnosis not present

## 2023-05-20 DIAGNOSIS — Z5189 Encounter for other specified aftercare: Secondary | ICD-10-CM | POA: Diagnosis not present

## 2023-05-20 MED ORDER — CYCLOBENZAPRINE HCL 10 MG PO TABS
10.0000 mg | ORAL_TABLET | Freq: Three times a day (TID) | ORAL | 0 refills | Status: DC | PRN
  Filled 2023-05-20: qty 30, 10d supply, fill #0

## 2023-05-29 DIAGNOSIS — M533 Sacrococcygeal disorders, not elsewhere classified: Secondary | ICD-10-CM | POA: Diagnosis not present

## 2023-05-29 DIAGNOSIS — R55 Syncope and collapse: Secondary | ICD-10-CM | POA: Diagnosis not present

## 2023-05-29 DIAGNOSIS — W19XXXA Unspecified fall, initial encounter: Secondary | ICD-10-CM | POA: Diagnosis not present

## 2023-05-30 ENCOUNTER — Other Ambulatory Visit (HOSPITAL_COMMUNITY): Payer: Self-pay | Admitting: Family Medicine

## 2023-05-30 DIAGNOSIS — W19XXXA Unspecified fall, initial encounter: Secondary | ICD-10-CM | POA: Diagnosis not present

## 2023-05-30 DIAGNOSIS — R55 Syncope and collapse: Secondary | ICD-10-CM

## 2023-05-30 DIAGNOSIS — M533 Sacrococcygeal disorders, not elsewhere classified: Secondary | ICD-10-CM | POA: Diagnosis not present

## 2023-06-02 DIAGNOSIS — Z79899 Other long term (current) drug therapy: Secondary | ICD-10-CM | POA: Diagnosis not present

## 2023-06-02 DIAGNOSIS — H53453 Other localized visual field defect, bilateral: Secondary | ICD-10-CM | POA: Diagnosis not present

## 2023-06-02 DIAGNOSIS — H43393 Other vitreous opacities, bilateral: Secondary | ICD-10-CM | POA: Diagnosis not present

## 2023-06-02 DIAGNOSIS — H353212 Exudative age-related macular degeneration, right eye, with inactive choroidal neovascularization: Secondary | ICD-10-CM | POA: Diagnosis not present

## 2023-06-02 DIAGNOSIS — H43813 Vitreous degeneration, bilateral: Secondary | ICD-10-CM | POA: Diagnosis not present

## 2023-06-02 DIAGNOSIS — H353221 Exudative age-related macular degeneration, left eye, with active choroidal neovascularization: Secondary | ICD-10-CM | POA: Diagnosis not present

## 2023-06-12 DIAGNOSIS — R296 Repeated falls: Secondary | ICD-10-CM | POA: Diagnosis not present

## 2023-06-12 DIAGNOSIS — D649 Anemia, unspecified: Secondary | ICD-10-CM | POA: Diagnosis not present

## 2023-06-13 DIAGNOSIS — J329 Chronic sinusitis, unspecified: Secondary | ICD-10-CM | POA: Diagnosis not present

## 2023-06-13 DIAGNOSIS — G319 Degenerative disease of nervous system, unspecified: Secondary | ICD-10-CM | POA: Diagnosis not present

## 2023-06-13 DIAGNOSIS — I6782 Cerebral ischemia: Secondary | ICD-10-CM | POA: Diagnosis not present

## 2023-06-13 DIAGNOSIS — H53453 Other localized visual field defect, bilateral: Secondary | ICD-10-CM | POA: Diagnosis not present

## 2023-06-23 DIAGNOSIS — R55 Syncope and collapse: Secondary | ICD-10-CM | POA: Diagnosis not present

## 2023-06-25 DIAGNOSIS — R55 Syncope and collapse: Secondary | ICD-10-CM | POA: Diagnosis not present

## 2023-06-30 ENCOUNTER — Telehealth: Payer: Self-pay | Admitting: Neurology

## 2023-06-30 ENCOUNTER — Other Ambulatory Visit: Payer: Self-pay | Admitting: Internal Medicine

## 2023-06-30 NOTE — Telephone Encounter (Signed)
 Call to check if patient has been schedule. Confirm patient has been scheduled.

## 2023-07-02 ENCOUNTER — Ambulatory Visit (HOSPITAL_COMMUNITY): Attending: Cardiology

## 2023-07-02 DIAGNOSIS — R55 Syncope and collapse: Secondary | ICD-10-CM | POA: Diagnosis not present

## 2023-07-02 LAB — ECHOCARDIOGRAM COMPLETE
Area-P 1/2: 3.17 cm2
S' Lateral: 2.4 cm

## 2023-07-09 NOTE — Progress Notes (Deleted)
 Office Visit Note  Patient: Meghan Schwartz             Date of Birth: 1950-10-23           MRN: [id number]             PCP: Karlton Overly, DO Referring: Karlton Overly, DO Visit Date: 07/23/2023   Subjective:  No chief complaint on file.   History of Present Illness: Meghan Schwartz is a 73 y.o. female here for follow up for RA on Enbrel  50 mg [place] weekly and severe osteoarthritis.    Previous HPI 03/19/2023 Meghan Schwartz is a 73 y.o. female here for follow up for RA on Enbrel  50 mg [place] weekly and severe osteoarthritis. She is scheduled for a knee replacement surgery on 2/17.  They also have a Baker's cyst, which causes sometimes causes more discomfort than the knee surface itself, and they wonder if it will be addressed during the surgery.   The patient is currently taking Enbrel  for their arthritis, but they have been advised to pause this medication two weeks before and after their surgery to minimize the risk of delayed wound healing or bacterial infection.   The patient also reports pain on the side of their leg, which they describe as a pressing sensation in three specific areas. They note that the pain is more pronounced when they walk.   In addition to their arthritis, the patient has a history of low potassium levels. They recently experienced an overnight weight gain of eight pounds, which they attribute to a change in their medication regimen. They had been taking torsemide  and potassium daily, but a new physician's assistant recommended they take these medications only on Monday, Wednesday, and Friday. After the weight gain, they returned to their daily regimen and lost the weight.   They also report that their skin is shedding and they have some scabbing and lesions, which they are trying not to scratch.     Previous HPI 12/18/22 Meghan Schwartz is a 73 y.o. female here for follow up for rheumatoid arthritis on Enbrel  50 mg [place] weekly and regarding chronic  osteoarthritis pain in the left knee.  Again had temporary symptom relief after aspiration injection of knee at her last visit but still has pain and instability and catching while weightbearing.  She developed trouble with her left eye with what she describes as some type of membrane associated with the previous cataract surgery.  Seeing Dr. Candi Chafe for management.  She is also discussing knee replacement surgery with Dr. Dayna Eve.  They talked at some length regarding she has high pre-existing cardiovascular risk factors for surgery but current function and quality of life is severely impaired and she has had only limited response to numerous injections and oral medications and does not want to go back on high-dose pain medicine.   Previous HPI 10/30/22 Meghan Schwartz is a 73 y.o. female here for follow up today for left knee pain and swelling. This is worse especially with pain in the posterior knee in the past week. This was not preceded by any specific injury illness or new activity. She is not wearing the knee brace recommended by Dr. Rozelle Corning because it is too bulky on her swollen knee and hits against the right knee. Also has pain and tenderness in an area below and lateral to the knee that is chronic but worse lately. She remains on enbrel  50 mg [place] weekly without interruption for her RA and  no increased swelling elsewhere.   Previous HPI 09/11/22 Meghan Schwartz is a 73 y.o. female here for follow up for rheumatoid arthritis on Enbrel  50 mg subcu weekly and osteoarthritis of the left knee.  We last saw her in April with repeat aspiration of the knee and popliteal cyst with no steroid injection at the time due to recent treatment.  She feels the cyst and back of knee pain has remained improved since that time.  She had a hospitalization last month due to severe hypokalemia down to 2.0.  Had preceding symptoms of worsening muscle weakness with change in posture and worsening mobility for the preceding weeks until  waking up with profound muscle weakness inability to stand and severe constipation.  Had some adjustment with her torsemide  and potassium supplementation and now back to about her baseline.   Previous HPI 07/30/22 Meghan Schwartz is a 73 y.o. female with rheumatoid arthritis on Enbrel  50 mg subcu weekly and osteoarthritis here for left knee joint pain and swelling.  At her last visit she felt there was more improvement in her knee pain with the Baker's cyst aspiration as well as joint injection versus previous aspiration injections at only the suprapatellar pouch.  Subsequently saw Dr. Rozelle Corning for evaluation of her severe primary osteoarthritis of the knee and also underwent joint aspiration that time with 45 cc fluid removed.  He discussed options for her realistically the only available procedure would be total knee arthroplasty.  She is hesitant about whether or not to pursue this due to associated pain and required rehabilitation and also cardiovascular risk.  Though she was cleared by her cardiologist to proceed with this if she wishes.  Currently has back due to increasing pain and swelling in the knee again and she is interested whether the popliteal cyst could be aspirated or injected again.  She is not noticing increased pain or swelling in other joints besides the knee.  No interruption or change to her Enbrel  or other medications. No new infections or injuries.   Previous HPI 06/12/22 Meghan Schwartz is a 73 y.o. female here for follow up for RA on Enbrel  50 mg [place] weekly and osteoarthritis with chronic left knee pain and swelling. We aspirated the left knee in March but she saw quick re accumulation of fluid.  Outside of left knee has not experienced much exacerbation of upper extremity pain.  Also describes some clicking or popping sensation frequently occurring and with decreased steadiness on her feet.  She is usually able to walk adequately with some form of assistive device at least a cane.   Previous  HPI 05/06/22 Meghan Schwartz is a 73 y.o. female here for follow up for RA on Enbrel  50 mg subcu weekly and osteoarthritis.  Since her last visit she had an issue with worsening pedal edema and a very large associated weight gain.  She was referred to see Dr. Avanell Bob with cardiology.  She was prescribed metolazone  to combine with her torsemide  and saw a rapid diuresis with 15 pound weight loss in 1 day.  Initially had associated skin peeling since then still has erythema and rough or thick skin over the affected areas but without any reaccumulation of the weight and pitting fluid.  Blood pressure has been somewhat low.  She has had some falls due to instability she is noticing trouble where she cannot apply weight onto her right foot evenly just gets pressure around the base of the second and third toe.  And left knee is causing more problems with reaccumulation of the large effusion and also with posterior swelling from baker's cyst.     Previous HPI 02/05/21 LARANDA JARDINES is a 73 y.o. female her for rheumatoid arthritis on Enbrel  50 mg [place] weekly for which she has been seeing Dr. Meredith Stalls previously. She was originally diagnosed about 10 or 11 years ago due to development of joint pain, stiffness, synovitis, and progressive joint deformities primarily starting in the feet and knees.  Subsequently proceeded to have increased joint pains involving bilateral hands with some swelling at the PIP and MCP joints.  She started treatment with Enbrel  injections with some improvement in the lower extremity joint pains she recalls still having a lot of pain symptoms and pronounced fatigue that did not clear with this treatment.  Combination treatment was tried with methotrexate with significant GI intolerance.  She tried leflunomide but developed substantial alopecia so discontinued the medicine.  She switched to hydroxychloroquine  combination treatment which was being well-tolerated but she has had multiple new or worsening  medical problems over the past few years with concern of medication related effects. She had hand numbness in the past improved after left carpal tunnel release surgery but she has some residual muscle atrophy in thenar prominence. She had left knee meniscectomy in 2020. She suffered NSTEMI in 01/2018 with DES placement in RCA for 99% blockage and has chronic diastolic CHF. Her edema is generally controlled while taking torsemide  but does rapidly re accumulate fluid off this medication. Ophthalmology evaluation indicated significant visual field deficits findings consistent with age-related macular degeneration of both eyes and recommended avoidance of this medication.  She also has considerable osteoarthritis especially in her knees and feet previous surgical reconstruction on the left foot. Overall she feels her gait is not very stable due to these problems.   DMARD Hx Enbrel  - current   HCQ - macular disease? LEF - alopecia MTX - GI intolerance.    No Rheumatology ROS completed.   PMFS History:  Patient Active Problem List   Diagnosis Date Noted   Primary osteoarthritis of left knee 04/14/2023   Hypocalcemia 08/21/2022   Falls, initial encounter 08/21/2022   Prolonged QT interval 08/21/2022   Acute metabolic encephalopathy 08/21/2022   Essential hypertension 08/21/2022   AKI (acute kidney injury) (HCC) 08/21/2022   Chronic pain 08/21/2022   Chronic diastolic CHF (congestive heart failure) (HCC) 08/21/2022   Effusion, left knee 06/12/2022   Leg edema, left 12/25/2021   Claw toe, left 08/07/2021   Dog bite 03/05/2021   High risk medication use 02/05/2021   Dry mouth 02/05/2021   Pain in left foot 01/31/2021   Bunion of great toe of right foot 06/15/2020   Hypoxia 03/05/2019   Derangement of posterior horn of medial meniscus 02/02/2019   Derangement of posterior horn of lateral meniscus 02/02/2019   Meniscal cyst, unspecified laterality 12/01/2018   Torn medial meniscus  12/01/2018   OA (osteoarthritis) of knee 12/01/2018   Coronary artery disease involving native coronary artery of native heart without angina pectoris 09/14/2018   (HFpEF) heart failure with preserved ejection fraction (HCC)    Chronic pain of left knee 05/15/2018   Mass of left knee 05/15/2018   Non-ST elevation (NSTEMI) myocardial infarction [facility])    Chest pain 01/25/2018   RA (rheumatoid arthritis) (HCC) 01/25/2018   Hyperlipidemia 01/25/2018   Hypokalemia 01/25/2018   Depression with anxiety 01/25/2018   Tobacco abuse 01/25/2018   Lower extremity cellulitis 01/25/2018  Benzodiazepine dependence (HCC)    MDD (major depressive disorder), recurrent severe, without psychosis (HCC) 10/02/2017   CTS (carpal tunnel syndrome) 06/08/2014    Past Medical History:  Diagnosis Date   Allergic rhinitis    Anxiety    Benzodiazepine dependence (HCC)    Bruxism    CAD (coronary artery disease)    S/p NSTEMI 01/2018 >> LHC: oLAD 20, pLAD 20; oD1 20, oLCx 40, pRCA 99 >> PCI: DES to prox RCA // Echo 01/2018: EF 50-55; prob inf-lat and inf HK   Chronic diastolic heart failure    Echocardiogram 07/2018: EF 55-60, grade 1 diastolic dysfunction, normal wall motion, normal GLS (-22.3), PASP 28   Chronic foot pain    bunions, torn ligaments-on chronic pain medication   CTS (carpal tunnel syndrome) 06/08/2014   Depression    High cholesterol    Hypertension    Low blood potassium    MDD (major depressive disorder) 10/02/2017   Myocardial infarction (HCC) 02/01/2018   Pneumonia 1986   left lung   RA (rheumatoid arthritis) (HCC)     Family History  Problem Relation Age of Onset   Thyroid  disease Mother    Breast cancer Mother    Parkinson's disease Father    Raynaud syndrome Maternal Aunt    Arthritis Maternal Grandmother    Heart attack Maternal Grandmother    Stroke Paternal Grandmother    Heart attack Paternal Grandfather    Past Surgical History:  Procedure Laterality Date   BTL   2000   CARPAL TUNNEL RELEASE Left    CESAREAN SECTION  1978. 1983   CORONARY STENT INTERVENTION N/A 01/27/2018   Procedure: CORONARY STENT INTERVENTION;  Surgeon: Odie Benne, MD;  Location: [facility];  Service: Cardiovascular;  Laterality: N/A;   eye implants     FOOT SURGERY Left 2008   front teeth removed after injury     KNEE ARTHROSCOPY WITH LATERAL MENISECTOMY Left 02/02/2019   Procedure: LEFT KNEE ARTHROSCOPY, MEDIAL AND LATERAL MENISECTOMY, EXCISION OF LEFT LATERAL MENISCAL CYST;  Surgeon: Shirlee Dotter, MD;  Location: [facility];  Service: Orthopedics;  Laterality: Left;   LEFT HEART CATH AND CORONARY ANGIOGRAPHY N/A 01/27/2018   Procedure: LEFT HEART CATH AND CORONARY ANGIOGRAPHY;  Surgeon: Odie Benne, MD;  Location: [facility];  Service: Cardiovascular;  Laterality: N/A;   pre cancerous lesion removed from forehead     TOTAL ABDOMINAL HYSTERECTOMY W/ BILATERAL SALPINGOOPHORECTOMY  2002   Dr. Francies Ireland   TOTAL KNEE ARTHROPLASTY Left 04/14/2023   Procedure: TOTAL KNEE ARTHROPLASTY;  Surgeon: Liliane Rei, MD;  Location: [facility];  Service: Orthopedics;  Laterality: Left;   VAGINAL HYSTERECTOMY  2001   Social History   Social History Narrative   Lives at home with spouse.    Right handed.   Caffeine use: 2 cups coffee/day    8 oz soda/day    Immunization History  Administered Date(s) Administered   PFIZER(Purple Top)SARS-COV-2 Vaccination 04/10/2019, 05/05/2019, 10/19/2019, 06/20/2020   Pfizer Covid-19 Vaccine Bivalent Booster 24yrs & up 11/29/2020   Tdap 04/03/2018     Objective: Vital Signs: There were no vitals taken for this visit.   Physical Exam   Musculoskeletal Exam: ***  CDAI Exam: CDAI Score: -- Patient Global: --; Provider Global: -- Swollen: --; Tender: -- Joint Exam 07/23/2023   No joint exam has been documented for this visit   There is currently no information documented on the homunculus. Go to the  Rheumatology  activity and complete the homunculus joint exam.  Investigation: No additional findings.  Imaging: ECHOCARDIOGRAM COMPLETE Result Date: 07/02/2023    ECHOCARDIOGRAM REPORT   Patient Name:   BRITTNE WEIGERT  Date of Exam: 07/02/2023 Medical Rec #:  [id number]     Height:       63.0 in Accession #:    [id number]    Weight:       158.0 lb Date of Birth:  [birth year]     BSA:          1.749 m Patient Age:    72 years      BP:           103/53 mmHg Patient Gender: F             HR:           72 bpm. Exam Location:  [address] Procedure: 2D Echo, Color Doppler, Cardiac Doppler, 3D Echo and Strain Analysis            (Both Spectral and Color Flow Doppler were utilized during            procedure). Indications:    Syncope R55  History:        Patient has prior history of Echocardiogram examinations, most                 recent 05/09/2022. CAD and Previous Myocardial Infarction; Risk                 Factors:Hypertension.  Sonographer:    Joleen Navy RDCS Referring Phys: [id number] [facility] IMPRESSIONS  1. Left ventricular ejection fraction, by estimation, is 55 to 60%. The left ventricle has normal function. The left ventricle has no regional wall motion abnormalities. Left ventricular diastolic parameters are consistent with Grade I diastolic dysfunction (impaired relaxation). The average left ventricular global longitudinal strain is -21.4 %. The global longitudinal strain is normal.  2. Right ventricular systolic function is normal. The right ventricular size is normal. Tricuspid regurgitation signal is inadequate for assessing PA pressure.  3. The mitral valve is normal in structure. No evidence of mitral valve regurgitation. No evidence of mitral stenosis.  4. The aortic valve is tricuspid. Aortic valve regurgitation is trivial. No aortic stenosis is present.  5. The inferior vena cava is normal in size with greater than 50% respiratory variability, suggesting right atrial pressure of 3 mmHg.  FINDINGS  Left Ventricle: Left ventricular ejection fraction, by estimation, is 55 to 60%. The left ventricle has normal function. The left ventricle has no regional wall motion abnormalities. The average left ventricular global longitudinal strain is -21.4 %. Strain was performed and the global longitudinal strain is normal. The left ventricular internal cavity size was normal in size. There is no left ventricular hypertrophy. Left ventricular diastolic parameters are consistent with Grade I diastolic dysfunction (impaired relaxation). Right Ventricle: The right ventricular size is normal. No increase in right ventricular wall thickness. Right ventricular systolic function is normal. Tricuspid regurgitation signal is inadequate for assessing PA pressure. Left Atrium: Left atrial size was normal in size. Right Atrium: Right atrial size was normal in size. Pericardium: There is no evidence of pericardial effusion. Mitral Valve: The mitral valve is normal in structure. No evidence of mitral valve regurgitation. No evidence of mitral valve stenosis. Tricuspid Valve: The tricuspid valve is normal in structure. Tricuspid valve regurgitation is trivial. Aortic Valve: The aortic valve is tricuspid. Aortic valve regurgitation is trivial. No aortic stenosis is  present. Pulmonic Valve: The pulmonic valve was normal in structure. Pulmonic valve regurgitation is not visualized. Aorta: The aortic root is normal in size and structure. Venous: The inferior vena cava is normal in size with greater than 50% respiratory variability, suggesting right atrial pressure of 3 mmHg. IAS/Shunts: No atrial level shunt detected by color flow Doppler.  LEFT VENTRICLE PLAX 2D LVIDd:         3.70 cm   Diastology LVIDs:         2.40 cm   LV e' medial:    7.62 cm/s LV PW:         1.00 cm   LV E/e' medial:  8.9 LV IVS:        0.90 cm   LV e' lateral:   8.05 cm/s LVOT diam:     2.00 cm   LV E/e' lateral: 8.4 LV SV:         68 LV SV Index:   39         2D Longitudinal Strain LVOT Area:     3.14 cm  2D Strain GLS Avg:     -21.4 %                           3D Volume EF:                          3D EF:        58 %                          LV EDV:       73 ml                          LV ESV:       31 ml                          LV SV:        42 ml RIGHT VENTRICLE             IVC RV Basal diam:  3.70 cm     IVC diam: 1.80 cm RV Mid diam:    2.60 cm RV S prime:     12.40 cm/s TAPSE (M-mode): 2.8 cm LEFT ATRIUM             Index        RIGHT ATRIUM           Index LA diam:        2.70 cm 1.54 cm/m   RA Area:     14.40 cm LA Vol (A2C):   29.4 ml 16.81 ml/m  RA Volume:   34.20 ml  19.55 ml/m LA Vol (A4C):   28.9 ml 16.52 ml/m LA Biplane Vol: 30.0 ml 17.15 ml/m  AORTIC VALVE LVOT Vmax:   104.00 cm/s LVOT Vmean:  70.700 cm/s LVOT VTI:    0.218 m  AORTA Ao Root diam: 3.60 cm Ao Asc diam:  3.70 cm MITRAL VALVE MV Area (PHT): 3.17 cm     SHUNTS MV Decel Time: 239 msec     Systemic VTI:  0.22 m MV E velocity: 67.60 cm/s   Systemic Diam: 2.00 cm MV A velocity: 101.00 cm/s MV E/A ratio:  0.67 Dalton McleanMD Electronically signed by Archer Bear  Signature Date/Time: 07/02/2023/4:49:21 PM    Final     Recent Labs: Lab Results  Component Value Date   WBC 6.3 04/16/2023   HGB 8.9 (L) 04/16/2023   PLT 154 04/16/2023   NA 139 04/15/2023   K 4.3 04/15/2023   CL 106 04/15/2023   CO2 25 04/15/2023   GLUCOSE 120 (H) 04/15/2023   BUN 20 04/15/2023   CREATININE 0.95 04/15/2023   BILITOT 0.8 03/19/2023   ALKPHOS 84 08/20/2022   AST 23 03/19/2023   ALT 16 03/19/2023   PROT 7.0 03/19/2023   ALBUMIN 2.7 (L) 08/23/2022   CALCIUM  8.8 (L) 04/15/2023   GFRAA 65 02/04/2020   QFTBGOLDPLUS NEGATIVE 06/12/2022    Speciality Comments: No specialty comments available.  Procedures:  No procedures performed Allergies: Erythromycin   Assessment / Plan:     Visit Diagnoses: No diagnosis found.  ***  Orders: No orders of the defined types were placed in this  encounter.  No orders of the defined types were placed in this encounter.    Follow-Up Instructions: No follow-ups on file.   Glena Landau, RT  Note - This record has been created using [employer].  Chart creation errors have been sought, but may not always  have been located. Such creation errors do not reflect on  the standard of medical care.

## 2023-07-10 ENCOUNTER — Other Ambulatory Visit: Payer: Self-pay

## 2023-07-10 ENCOUNTER — Other Ambulatory Visit (HOSPITAL_COMMUNITY): Payer: Self-pay

## 2023-07-10 ENCOUNTER — Telehealth: Payer: Self-pay

## 2023-07-10 DIAGNOSIS — M069 Rheumatoid arthritis, unspecified: Secondary | ICD-10-CM

## 2023-07-10 DIAGNOSIS — Z79899 Other long term (current) drug therapy: Secondary | ICD-10-CM

## 2023-07-10 MED ORDER — ZUBSOLV 5.7-1.4 MG SL SUBL
SUBLINGUAL_TABLET | SUBLINGUAL | 0 refills | Status: DC
  Filled 2023-07-10: qty 90, 30d supply, fill #0

## 2023-07-10 MED ORDER — HORIZANT 600 MG PO TBCR
600.0000 mg | EXTENDED_RELEASE_TABLET | Freq: Two times a day (BID) | ORAL | 0 refills | Status: DC
  Filled 2023-07-10 – 2023-08-11 (×5): qty 60, 30d supply, fill #0

## 2023-07-10 NOTE — Telephone Encounter (Signed)
 Received Enbrel  PAP renewal application via fax  Submitted a Prior Authorization request to [employer] for ENBREL  via CoverMyMeds. Will update once we receive a response.  Key: [id number]

## 2023-07-11 ENCOUNTER — Other Ambulatory Visit: Payer: Self-pay

## 2023-07-11 ENCOUNTER — Other Ambulatory Visit (HOSPITAL_COMMUNITY): Payer: Self-pay

## 2023-07-11 ENCOUNTER — Other Ambulatory Visit: Payer: Self-pay | Admitting: Pharmacist

## 2023-07-11 DIAGNOSIS — K117 Disturbances of salivary secretion: Secondary | ICD-10-CM | POA: Diagnosis not present

## 2023-07-11 DIAGNOSIS — D649 Anemia, unspecified: Secondary | ICD-10-CM | POA: Diagnosis not present

## 2023-07-11 DIAGNOSIS — Z9181 History of falling: Secondary | ICD-10-CM | POA: Diagnosis not present

## 2023-07-11 MED ORDER — ENBREL SURECLICK 50 MG/ML ~~LOC~~ SOAJ
50.0000 mg | SUBCUTANEOUS | 0 refills | Status: DC
  Filled 2023-07-11 (×2): qty 4, 28d supply, fill #0

## 2023-07-11 NOTE — Progress Notes (Signed)
 Specialty Pharmacy Initial Fill Coordination Note  Meghan Schwartz is a 73 y.o. female contacted today regarding initial fill of specialty medication(s) Etanercept  (Enbrel  SureClick)   Patient requested Delivery   Delivery date: 07/15/23   Verified address: [address]   [place] [address]   Medication will be filled on 07/14/23.   Patient is aware of $0 copayment.

## 2023-07-11 NOTE — Progress Notes (Signed)
 Patient on Enbrel  50mg  subcut every 7 days. Previously receiving through PAP. No copay through [facility] plan. Continue Enbrel  50mg  subcut every 7 days as monotherapy  Tyshun Tuckerman, PharmD, MPH, BCPS, CPP Clinical Pharmacist (Rheumatology and Pulmonology)

## 2023-07-11 NOTE — Telephone Encounter (Signed)
 Received notification from [employer] regarding a prior authorization for ENBREL . Authorization has been APPROVED from 07/10/23 to 02/25/24. Approval letter sent to scan center.  Per test claim, copay for 28 days supply is $0  Patient can fill with [facility]. Rx sent to [facility] for onboarding.  Geraldene Kleine, PharmD, MPH, BCPS, CPP Clinical Pharmacist (Rheumatology and Pulmonology)

## 2023-07-23 ENCOUNTER — Ambulatory Visit: Payer: Medicare PPO | Admitting: Internal Medicine

## 2023-07-23 DIAGNOSIS — M069 Rheumatoid arthritis, unspecified: Secondary | ICD-10-CM

## 2023-07-23 DIAGNOSIS — M1712 Unilateral primary osteoarthritis, left knee: Secondary | ICD-10-CM

## 2023-07-23 DIAGNOSIS — E876 Hypokalemia: Secondary | ICD-10-CM

## 2023-07-23 DIAGNOSIS — Z79899 Other long term (current) drug therapy: Secondary | ICD-10-CM

## 2023-08-04 ENCOUNTER — Other Ambulatory Visit: Payer: Self-pay

## 2023-08-04 ENCOUNTER — Other Ambulatory Visit: Payer: Self-pay | Admitting: Internal Medicine

## 2023-08-04 DIAGNOSIS — Z111 Encounter for screening for respiratory tuberculosis: Secondary | ICD-10-CM

## 2023-08-04 DIAGNOSIS — Z79899 Other long term (current) drug therapy: Secondary | ICD-10-CM

## 2023-08-04 DIAGNOSIS — F331 Major depressive disorder, recurrent, moderate: Secondary | ICD-10-CM | POA: Diagnosis not present

## 2023-08-04 DIAGNOSIS — M069 Rheumatoid arthritis, unspecified: Secondary | ICD-10-CM

## 2023-08-04 DIAGNOSIS — F411 Generalized anxiety disorder: Secondary | ICD-10-CM | POA: Diagnosis not present

## 2023-08-04 NOTE — Telephone Encounter (Signed)
 Last Fill: 07/11/2023  Labs: 07/11/2023 CBC w/o Diff ([facility] Tab) MCH 26.1 MCHC 29.9  04/15/2023 BMP Glucose 120 Calcium  8.8  TB Gold: 06/12/2022 Negative   Next Visit: No follow-ups on file.   Last Visit: 03/19/2023  DX: Rheumatoid arthritis, involving unspecified site, unspecified whether rheumatoid factor present (HCC)   Current Dose per office note 03/19/2023: Enbrel  50 mg [place] weekly   Attempted to contact the patient and left a message advising labs are due.   Okay to refill Enbrel ?

## 2023-08-05 ENCOUNTER — Other Ambulatory Visit (HOSPITAL_COMMUNITY): Payer: Self-pay

## 2023-08-05 ENCOUNTER — Other Ambulatory Visit: Payer: Self-pay

## 2023-08-05 DIAGNOSIS — F331 Major depressive disorder, recurrent, moderate: Secondary | ICD-10-CM | POA: Diagnosis not present

## 2023-08-05 DIAGNOSIS — R296 Repeated falls: Secondary | ICD-10-CM | POA: Diagnosis not present

## 2023-08-05 DIAGNOSIS — Z5189 Encounter for other specified aftercare: Secondary | ICD-10-CM | POA: Diagnosis not present

## 2023-08-05 DIAGNOSIS — F1320 Sedative, hypnotic or anxiolytic dependence, uncomplicated: Secondary | ICD-10-CM | POA: Diagnosis not present

## 2023-08-05 DIAGNOSIS — F411 Generalized anxiety disorder: Secondary | ICD-10-CM | POA: Diagnosis not present

## 2023-08-05 DIAGNOSIS — M25561 Pain in right knee: Secondary | ICD-10-CM | POA: Diagnosis not present

## 2023-08-05 MED ORDER — CYCLOBENZAPRINE HCL 10 MG PO TABS
10.0000 mg | ORAL_TABLET | Freq: Three times a day (TID) | ORAL | 0 refills | Status: DC | PRN
  Filled 2023-08-05: qty 30, 10d supply, fill #0

## 2023-08-05 MED ORDER — ENBREL SURECLICK 50 MG/ML ~~LOC~~ SOAJ
50.0000 mg | SUBCUTANEOUS | 0 refills | Status: DC
  Filled 2023-08-05 – 2023-08-06 (×2): qty 4, 28d supply, fill #0

## 2023-08-06 ENCOUNTER — Other Ambulatory Visit: Payer: Self-pay

## 2023-08-06 NOTE — Progress Notes (Signed)
 Specialty Pharmacy Refill Coordination Note  Meghan Schwartz is a 73 y.o. female contacted today regarding refills of specialty medication(s) Etanercept  (Enbrel  SureClick)   Patient requested Delivery   Delivery date: 08/08/23   Verified address: [address]   [place] [address]   Medication will be filled on 08/07/23.

## 2023-08-11 ENCOUNTER — Other Ambulatory Visit (HOSPITAL_COMMUNITY): Payer: Self-pay

## 2023-08-11 ENCOUNTER — Other Ambulatory Visit: Payer: Self-pay

## 2023-08-12 ENCOUNTER — Other Ambulatory Visit: Payer: Self-pay

## 2023-08-12 ENCOUNTER — Other Ambulatory Visit (HOSPITAL_COMMUNITY): Payer: Self-pay

## 2023-08-12 MED ORDER — BUPRENORPHINE HCL-NALOXONE HCL 5.7-1.4 MG SL SUBL
1.0000 | SUBLINGUAL_TABLET | Freq: Three times a day (TID) | SUBLINGUAL | 0 refills | Status: DC
  Filled 2023-08-12 (×2): qty 90, 30d supply, fill #0

## 2023-08-13 ENCOUNTER — Other Ambulatory Visit: Payer: Self-pay

## 2023-08-13 ENCOUNTER — Other Ambulatory Visit (HOSPITAL_COMMUNITY): Payer: Self-pay

## 2023-08-14 ENCOUNTER — Other Ambulatory Visit (HOSPITAL_COMMUNITY): Payer: Self-pay

## 2023-08-26 ENCOUNTER — Other Ambulatory Visit (HOSPITAL_COMMUNITY): Payer: Self-pay

## 2023-09-08 ENCOUNTER — Other Ambulatory Visit (HOSPITAL_COMMUNITY): Payer: Self-pay

## 2023-09-08 ENCOUNTER — Other Ambulatory Visit: Payer: Self-pay

## 2023-09-08 ENCOUNTER — Other Ambulatory Visit: Payer: Self-pay | Admitting: Internal Medicine

## 2023-09-08 DIAGNOSIS — M069 Rheumatoid arthritis, unspecified: Secondary | ICD-10-CM

## 2023-09-08 DIAGNOSIS — Z79899 Other long term (current) drug therapy: Secondary | ICD-10-CM

## 2023-09-08 MED ORDER — ENBREL SURECLICK 50 MG/ML ~~LOC~~ SOAJ
50.0000 mg | SUBCUTANEOUS | 0 refills | Status: DC
  Filled 2023-09-08 – 2023-09-10 (×2): qty 4, 28d supply, fill #0

## 2023-09-08 MED ORDER — HORIZANT 600 MG PO TBCR
600.0000 mg | EXTENDED_RELEASE_TABLET | Freq: Two times a day (BID) | ORAL | 0 refills | Status: AC
  Filled 2023-09-08: qty 60, 30d supply, fill #0

## 2023-09-08 NOTE — Telephone Encounter (Addendum)
 Last Fill: 08/05/2023   Labs: 07/11/2023 CBC w/o Diff ([facility] Tab) MCH 26.1 MCHC 29.9   04/15/2023 BMP Glucose 120 Calcium  8.8   TB Gold: 06/12/2022 Negative    Next Visit: No follow-ups on file.    Last Visit: 03/19/2023   DX: Rheumatoid arthritis, involving unspecified site, unspecified whether rheumatoid factor present (HCC)    Current Dose per office note 03/19/2023: Enbrel  50 mg [place] weekly    Okay to refill Enbrel ?   Spoke with patient, she stated she would be in this week to update labs

## 2023-09-09 ENCOUNTER — Other Ambulatory Visit (HOSPITAL_COMMUNITY): Payer: Self-pay

## 2023-09-10 ENCOUNTER — Other Ambulatory Visit: Payer: Self-pay

## 2023-09-10 NOTE — Progress Notes (Signed)
 Specialty Pharmacy Refill Coordination Note  Meghan Schwartz is a 73 y.o. female contacted today regarding refills of specialty medication(s) Etanercept  (Enbrel  SureClick)   Patient requested Delivery   Delivery date: 09/25/23   Verified address: [address]   [place] [address]   Medication will be filled on 09/24/23.

## 2023-09-11 DIAGNOSIS — M1711 Unilateral primary osteoarthritis, right knee: Secondary | ICD-10-CM | POA: Diagnosis not present

## 2023-09-11 DIAGNOSIS — Z5189 Encounter for other specified aftercare: Secondary | ICD-10-CM | POA: Diagnosis not present

## 2023-09-11 DIAGNOSIS — M170 Bilateral primary osteoarthritis of knee: Secondary | ICD-10-CM | POA: Diagnosis not present

## 2023-09-23 ENCOUNTER — Other Ambulatory Visit: Payer: Self-pay

## 2023-09-24 ENCOUNTER — Other Ambulatory Visit: Payer: Self-pay

## 2023-09-24 DIAGNOSIS — R051 Acute cough: Secondary | ICD-10-CM | POA: Diagnosis not present

## 2023-09-24 DIAGNOSIS — R0981 Nasal congestion: Secondary | ICD-10-CM | POA: Diagnosis not present

## 2023-09-24 DIAGNOSIS — J019 Acute sinusitis, unspecified: Secondary | ICD-10-CM | POA: Diagnosis not present

## 2023-09-24 DIAGNOSIS — U071 COVID-19: Secondary | ICD-10-CM | POA: Diagnosis not present

## 2023-09-26 NOTE — Progress Notes (Signed)
 Office Visit Note  Patient: Meghan Schwartz             Date of Birth: [birth year]           MRN: [id number]             PCP: Chet Mad, DO Referring: Chet Mad, DO Visit Date: 10/07/2023   Subjective:  Follow-up   Discussed the use of AI scribe software for clinical note transcription with the patient, who gave verbal consent to proceed.  History of Present Illness   Meghan Schwartz is a 73 y.o. female here for follow up for RA on Enbrel  50 mg [place] weekly and severe knee osteoarthritis.    She underwent knee replacement surgery in February and has experienced significant improvement in her condition. She had excellent postop recovery with good knee ROM achieved and she was discharged from rehabilitation. She did not require prolonged use of pain medication post-surgery.  Following the knee replacement, she experienced a fall while visiting a neighbor, resulting in a hematoma on her right leg. X-rays confirmed the diagnosis, and it was noted that it would take months to resolve. She has been receiving follow-up care, and the condition is reportedly improving.  She continues to use Enbrel  injections and reports no major infections since the last visit, although she did contract COVID-19. She initially mistook the symptoms for a sinus infection and was diagnosed at a walk-in clinic. She was treated with Paxlovid and amoxicillin, which resolved her symptoms, and she quarantined herself during the illness.  She received a cortisone injection for the right knee approximately three to four weeks ago, which has since worn off. During that visit she reports 42 milliliters of fluid were aspirated from her knee.      Previous HPI 03/19/2023 Meghan Schwartz is a 73 y.o. female here for follow up for RA on Enbrel  50 mg [place] weekly and severe osteoarthritis. She is scheduled for a knee replacement surgery on 2/17.  They also have a Baker's cyst, which causes sometimes causes more discomfort  than the knee surface itself, and they wonder if it will be addressed during the surgery.   The patient is currently taking Enbrel  for their arthritis, but they have been advised to pause this medication two weeks before and after their surgery to minimize the risk of delayed wound healing or bacterial infection.   The patient also reports pain on the side of their leg, which they describe as a pressing sensation in three specific areas. They note that the pain is more pronounced when they walk.   In addition to their arthritis, the patient has a history of low potassium levels. They recently experienced an overnight weight gain of eight pounds, which they attribute to a change in their medication regimen. They had been taking torsemide  and potassium daily, but a new physician's assistant recommended they take these medications only on Monday, Wednesday, and Friday. After the weight gain, they returned to their daily regimen and lost the weight.   They also report that their skin is shedding and they have some scabbing and lesions, which they are trying not to scratch.     Previous HPI 12/18/22 Meghan Schwartz is a 73 y.o. female here for follow up for rheumatoid arthritis on Enbrel  50 mg [place] weekly and regarding chronic osteoarthritis pain in the left knee.  Again had temporary symptom relief after aspiration injection of knee at her last visit but still has pain and instability  and catching while weightbearing.  She developed trouble with her left eye with what she describes as some type of membrane associated with the previous cataract surgery.  Seeing Dr. Octavia for management.  She is also discussing knee replacement surgery with Dr. Theotis.  They talked at some length regarding she has high pre-existing cardiovascular risk factors for surgery but current function and quality of life is severely impaired and she has had only limited response to numerous injections and oral medications and does not  want to go back on high-dose pain medicine.   Previous HPI 10/30/22 MAKIAH Schwartz is a 73 y.o. female here for follow up today for left knee pain and swelling. This is worse especially with pain in the posterior knee in the past week. This was not preceded by any specific injury illness or new activity. She is not wearing the knee brace recommended by Dr. Addie because it is too bulky on her swollen knee and hits against the right knee. Also has pain and tenderness in an area below and lateral to the knee that is chronic but worse lately. She remains on enbrel  50 mg [place] weekly without interruption for her RA and no increased swelling elsewhere.   Previous HPI 09/11/22 Meghan Schwartz is a 73 y.o. female here for follow up for rheumatoid arthritis on Enbrel  50 mg subcu weekly and osteoarthritis of the left knee.  We last saw her in April with repeat aspiration of the knee and popliteal cyst with no steroid injection at the time due to recent treatment.  She feels the cyst and back of knee pain has remained improved since that time.  She had a hospitalization last month due to severe hypokalemia down to 2.0.  Had preceding symptoms of worsening muscle weakness with change in posture and worsening mobility for the preceding weeks until waking up with profound muscle weakness inability to stand and severe constipation.  Had some adjustment with her torsemide  and potassium supplementation and now back to about her baseline.   Previous HPI 07/30/22 VONNETTA Schwartz is a 73 y.o. female with rheumatoid arthritis on Enbrel  50 mg subcu weekly and osteoarthritis here for left knee joint pain and swelling.  At her last visit she felt there was more improvement in her knee pain with the Baker's cyst aspiration as well as joint injection versus previous aspiration injections at only the suprapatellar pouch.  Subsequently saw Dr. Addie for evaluation of her severe primary osteoarthritis of the knee and also underwent joint aspiration  that time with 45 cc fluid removed.  He discussed options for her realistically the only available procedure would be total knee arthroplasty.  She is hesitant about whether or not to pursue this due to associated pain and required rehabilitation and also cardiovascular risk.  Though she was cleared by her cardiologist to proceed with this if she wishes.  Currently has back due to increasing pain and swelling in the knee again and she is interested whether the popliteal cyst could be aspirated or injected again.  She is not noticing increased pain or swelling in other joints besides the knee.  No interruption or change to her Enbrel  or other medications. No new infections or injuries.   Previous HPI 06/12/22 MAYMIE BRUNKE is a 73 y.o. female here for follow up for RA on Enbrel  50 mg [place] weekly and osteoarthritis with chronic left knee pain and swelling. We aspirated the left knee in March but she saw quick re accumulation  of fluid.  Outside of left knee has not experienced much exacerbation of upper extremity pain.  Also describes some clicking or popping sensation frequently occurring and with decreased steadiness on her feet.  She is usually able to walk adequately with some form of assistive device at least a cane.   Previous HPI 05/06/22 OVIDA DELAGARZA is a 73 y.o. female here for follow up for RA on Enbrel  50 mg subcu weekly and osteoarthritis.  Since her last visit she had an issue with worsening pedal edema and a very large associated weight gain.  She was referred to see Dr. Okey with cardiology.  She was prescribed metolazone  to combine with her torsemide  and saw a rapid diuresis with 15 pound weight loss in 1 day.  Initially had associated skin peeling since then still has erythema and rough or thick skin over the affected areas but without any reaccumulation of the weight and pitting fluid.  Blood pressure has been somewhat low.  She has had some falls due to instability she is noticing trouble where  she cannot apply weight onto her right foot evenly just gets pressure around the base of the second and third toe.  And left knee is causing more problems with reaccumulation of the large effusion and also with posterior swelling from baker's cyst.     Previous HPI 02/05/21 MILYNN QUIRION is a 73 y.o. female her for rheumatoid arthritis on Enbrel  50 mg [place] weekly for which she has been seeing Dr. Ishmael previously. She was originally diagnosed about 10 or 11 years ago due to development of joint pain, stiffness, synovitis, and progressive joint deformities primarily starting in the feet and knees.  Subsequently proceeded to have increased joint pains involving bilateral hands with some swelling at the PIP and MCP joints.  She started treatment with Enbrel  injections with some improvement in the lower extremity joint pains she recalls still having a lot of pain symptoms and pronounced fatigue that did not clear with this treatment.  Combination treatment was tried with methotrexate with significant GI intolerance.  She tried leflunomide but developed substantial alopecia so discontinued the medicine.  She switched to hydroxychloroquine  combination treatment which was being well-tolerated but she has had multiple new or worsening medical problems over the past few years with concern of medication related effects. She had hand numbness in the past improved after left carpal tunnel release surgery but she has some residual muscle atrophy in thenar prominence. She had left knee meniscectomy in 2020. She suffered NSTEMI in 01/2018 with DES placement in RCA for 99% blockage and has chronic diastolic CHF. Her edema is generally controlled while taking torsemide  but does rapidly re accumulate fluid off this medication. Ophthalmology evaluation indicated significant visual field deficits findings consistent with age-related macular degeneration of both eyes and recommended avoidance of this medication.  She also has  considerable osteoarthritis especially in her knees and feet previous surgical reconstruction on the left foot. Overall she feels her gait is not very stable due to these problems.   DMARD Hx Enbrel  - current   HCQ - macular disease? LEF - alopecia MTX - GI intolerance.    Review of Systems  Constitutional:  Negative for fatigue.  HENT:  Positive for mouth dryness. Negative for mouth sores.   Eyes:  Negative for dryness.  Respiratory:  Negative for shortness of breath.   Cardiovascular:  Negative for chest pain and palpitations.  Gastrointestinal:  Negative for blood in stool, constipation and diarrhea.  Endocrine: Negative for increased urination.  Genitourinary:  Negative for involuntary urination.  Musculoskeletal:  Positive for joint pain, gait problem, joint pain, joint swelling, myalgias, muscle weakness and myalgias. Negative for morning stiffness and muscle tenderness.  Skin:  Positive for color change. Negative for rash, hair loss and sensitivity to sunlight.  Allergic/Immunologic: Negative for susceptible to infections.  Neurological:  Negative for dizziness and headaches.  Hematological:  Negative for swollen glands.  Psychiatric/Behavioral:  Positive for depressed mood. Negative for sleep disturbance. The patient is nervous/anxious.     PMFS History:  Patient Active Problem List   Diagnosis Date Noted   Primary osteoarthritis of left knee 04/14/2023   Hypocalcemia 08/21/2022   Falls, initial encounter 08/21/2022   Prolonged QT interval 08/21/2022   Acute metabolic encephalopathy 08/21/2022   Essential hypertension 08/21/2022   AKI (acute kidney injury) (HCC) 08/21/2022   Chronic pain 08/21/2022   Chronic diastolic CHF (congestive heart failure) (HCC) 08/21/2022   Effusion, left knee 06/12/2022   Leg edema, left 12/25/2021   Claw toe, left 08/07/2021   Dog bite 03/05/2021   High risk medication use 02/05/2021   Dry mouth 02/05/2021   Pain in left foot  01/31/2021   Bunion of great toe of right foot 06/15/2020   Hypoxia 03/05/2019   Derangement of posterior horn of medial meniscus 02/02/2019   Derangement of posterior horn of lateral meniscus 02/02/2019   Meniscal cyst, unspecified laterality 12/01/2018   Torn medial meniscus 12/01/2018   OA (osteoarthritis) of knee 12/01/2018   Coronary artery disease involving native coronary artery of native heart without angina pectoris 09/14/2018   (HFpEF) heart failure with preserved ejection fraction (HCC)    Chronic pain of left knee 05/15/2018   Mass of left knee 05/15/2018   Non-ST elevation (NSTEMI) myocardial infarction [facility])    Chest pain 01/25/2018   RA (rheumatoid arthritis) (HCC) 01/25/2018   Hyperlipidemia 01/25/2018   Hypokalemia 01/25/2018   Depression with anxiety 01/25/2018   Tobacco abuse 01/25/2018   Lower extremity cellulitis 01/25/2018   Benzodiazepine dependence (HCC)    MDD (major depressive disorder), recurrent severe, without psychosis (HCC) 10/02/2017   CTS (carpal tunnel syndrome) 06/08/2014    Past Medical History:  Diagnosis Date   Allergic rhinitis    Anxiety    Benzodiazepine dependence (HCC)    Bruxism    CAD (coronary artery disease)    S/p NSTEMI 01/2018 >> LHC: oLAD 20, pLAD 20; oD1 20, oLCx 40, pRCA 99 >> PCI: DES to prox RCA // Echo 01/2018: EF 50-55; prob inf-lat and inf HK   Chronic diastolic heart failure    Echocardiogram 07/2018: EF 55-60, grade 1 diastolic dysfunction, normal wall motion, normal GLS (-22.3), PASP 28   Chronic foot pain    bunions, torn ligaments-on chronic pain medication   CTS (carpal tunnel syndrome) 06/08/2014   Depression    High cholesterol    Hypertension    Low blood potassium    MDD (major depressive disorder) 10/02/2017   Myocardial infarction (HCC) 02/01/2018   Pneumonia 1986   left lung   RA (rheumatoid arthritis) (HCC)     Family History  Problem Relation Age of Onset   Thyroid  disease Mother    Breast cancer  Mother    Parkinson's disease Father    Raynaud syndrome Maternal Aunt    Arthritis Maternal Grandmother    Heart attack Maternal Grandmother    Stroke Paternal Grandmother    Heart attack Paternal Grandfather  Past Surgical History:  Procedure Laterality Date   BTL  2000   CARPAL TUNNEL RELEASE Left    CESAREAN SECTION  1978. 1983   CORONARY STENT INTERVENTION N/A 01/27/2018   Procedure: CORONARY STENT INTERVENTION;  Surgeon: Verlin Lonni BIRCH, MD;  Location: [facility];  Service: Cardiovascular;  Laterality: N/A;   eye implants     FOOT SURGERY Left 2008   front teeth removed after injury     KNEE ARTHROSCOPY WITH LATERAL MENISECTOMY Left 02/02/2019   Procedure: LEFT KNEE ARTHROSCOPY, MEDIAL AND LATERAL MENISECTOMY, EXCISION OF LEFT LATERAL MENISCAL CYST;  Surgeon: Anderson Maude ORN, MD;  Location: [facility];  Service: Orthopedics;  Laterality: Left;   LEFT HEART CATH AND CORONARY ANGIOGRAPHY N/A 01/27/2018   Procedure: LEFT HEART CATH AND CORONARY ANGIOGRAPHY;  Surgeon: Verlin Lonni BIRCH, MD;  Location: [facility];  Service: Cardiovascular;  Laterality: N/A;   pre cancerous lesion removed from forehead     TOTAL ABDOMINAL HYSTERECTOMY W/ BILATERAL SALPINGOOPHORECTOMY  2002   Dr. Horacio   TOTAL KNEE ARTHROPLASTY Left 04/14/2023   Procedure: TOTAL KNEE ARTHROPLASTY;  Surgeon: Melodi Lerner, MD;  Location: [facility];  Service: Orthopedics;  Laterality: Left;   VAGINAL HYSTERECTOMY  2001   Social History   Social History Narrative   Lives at home with spouse.    Right handed.   Caffeine use: 2 cups coffee/day    8 oz soda/day    Immunization History  Administered Date(s) Administered   PFIZER(Purple Top)SARS-COV-2 Vaccination 04/10/2019, 05/05/2019, 10/19/2019, 06/20/2020   Pfizer Covid-19 Vaccine Bivalent Booster 40yrs & up 11/29/2020   Tdap 04/03/2018     Objective: Vital Signs: BP 111/77 (BP Location: Left Arm, Patient Position: Sitting, Cuff  Size: Normal)   Pulse 89   Resp 17   Ht 5' 3 (1.6 m)   Wt 158 lb 6.4 oz (71.8 kg)   BMI 28.06 kg/m    Physical Exam Eyes:     Conjunctiva/sclera: Conjunctivae normal.  Cardiovascular:     Rate and Rhythm: Normal rate and regular rhythm.  Pulmonary:     Effort: Pulmonary effort is normal.     Breath sounds: Normal breath sounds.  Musculoskeletal:     Right lower leg: No edema.     Left lower leg: No edema.  Skin:    General: Skin is warm and dry.     Findings: No rash.  Neurological:     Mental Status: She is alert.  Psychiatric:        Mood and Affect: Mood normal.      Musculoskeletal Exam:  Shoulders full ROM no tenderness or swelling Elbows full ROM no tenderness or swelling Wrists full ROM no tenderness or swelling Fingers heberdon's nodes throughout both hands, MCP enlargement and partially reducible lateral deviation, no palpable synovitis No paraspinal tenderness to palpation over upper and lower back Hip normal internal and external rotation without pain, no tenderness to lateral hip palpation Right knee crepitus, full ROM, no effusion, left knee postsurgical changes, no effusion, full ROM MTPs full ROM no tenderness or swelling  Investigation: No additional findings.  Imaging: No results found.  Recent Labs: Lab Results  Component Value Date   WBC 6.3 04/16/2023   HGB 8.9 (L) 04/16/2023   PLT 154 04/16/2023   NA 139 04/15/2023   K 4.3 04/15/2023   CL 106 04/15/2023   CO2 25 04/15/2023   GLUCOSE 120 (H) 04/15/2023   BUN 20 04/15/2023   CREATININE  0.95 04/15/2023   BILITOT 0.8 03/19/2023   ALKPHOS 84 08/20/2022   AST 23 03/19/2023   ALT 16 03/19/2023   PROT 7.0 03/19/2023   ALBUMIN 2.7 (L) 08/23/2022   CALCIUM  8.8 (L) 04/15/2023   GFRAA 65 02/04/2020   QFTBGOLDPLUS NEGATIVE 06/12/2022    Speciality Comments: No specialty comments available.  Procedures:  No procedures performed Allergies: Erythromycin   Assessment / Plan:     Visit  Diagnoses: Rheumatoid arthritis, involving unspecified site, unspecified whether rheumatoid factor present (HCC) - Plan: Sedimentation rate Inflammatory arthritis appears well-controlled with no peripheral joint synovitis present on exam today.  She does not ascribe any major flareups.  Interrupted Enbrel  only for her surgery and with COVID infection did not appear to have major complications. - Checking sed rate for disease activity monitoring - Continue Enbrel  50 mg subcu weekly  High risk medication use - Enbrel  50 mg  weekly - Plan: CBC with Differential/Platelet, Comprehensive metabolic panel with GFR, QuantiFERON-TB Gold Plus Tolerating medication well without major interval events.  Had a COVID illness but probably not at all related to her Enbrel  treatment.  No other serious interval infections.  Is currently overdue for monitoring labs. - Checking CBC CMP and QuantiFERON for medication monitoring on Enbrel     Status post left knee replacement Osteoarthritis Chronic osteoarthritis with recurrent knee effusions but noninflammatory which was assessed numerous times before.  Excellent outcome with the knee replacement surgery.  Recent aspiration and cortisone injection provided relief. - Order blood tests including annual TB screening and routine labs for Enbrel  monitoring.   Orders: Orders Placed This Encounter  Procedures   Sedimentation rate   CBC with Differential/Platelet   Comprehensive metabolic panel with GFR   QuantiFERON-TB Gold Plus   Meds ordered this encounter  Medications   etanercept  (ENBREL  SURECLICK) 50 MG/ML injection    Sig: Inject 50 mg into the skin once a week.    Dispense:  4 mL    Refill:  2     Follow-Up Instructions: Return in about 3 months (around 01/07/2024) for RA on ENB f/u 3mos.   Lonni LELON Ester, MD  Note - This record has been created using [employer].  Chart creation errors have been sought, but may not always  have been  located. Such creation errors do not reflect on  the standard of medical care.

## 2023-09-29 ENCOUNTER — Other Ambulatory Visit (HOSPITAL_COMMUNITY): Payer: Self-pay

## 2023-10-07 ENCOUNTER — Other Ambulatory Visit: Payer: Self-pay

## 2023-10-07 ENCOUNTER — Ambulatory Visit: Attending: Internal Medicine | Admitting: Internal Medicine

## 2023-10-07 ENCOUNTER — Encounter: Payer: Self-pay | Admitting: Internal Medicine

## 2023-10-07 VITALS — BP 111/77 | HR 89 | Resp 17 | Ht 63.0 in | Wt 158.4 lb

## 2023-10-07 DIAGNOSIS — Z79899 Other long term (current) drug therapy: Secondary | ICD-10-CM | POA: Diagnosis not present

## 2023-10-07 DIAGNOSIS — M25462 Effusion, left knee: Secondary | ICD-10-CM

## 2023-10-07 DIAGNOSIS — E876 Hypokalemia: Secondary | ICD-10-CM | POA: Diagnosis not present

## 2023-10-07 DIAGNOSIS — M069 Rheumatoid arthritis, unspecified: Secondary | ICD-10-CM | POA: Diagnosis not present

## 2023-10-07 MED ORDER — ENBREL SURECLICK 50 MG/ML ~~LOC~~ SOAJ
50.0000 mg | SUBCUTANEOUS | 2 refills | Status: DC
  Filled 2023-10-07 – 2023-10-22 (×2): qty 4, 28d supply, fill #0
  Filled 2023-11-21 – 2023-12-19 (×4): qty 4, 28d supply, fill #1
  Filled 2024-01-12 – 2024-01-20 (×2): qty 4, 28d supply, fill #2

## 2023-10-08 ENCOUNTER — Other Ambulatory Visit: Payer: Medicare PPO

## 2023-10-08 ENCOUNTER — Ambulatory Visit: Payer: Self-pay | Admitting: Internal Medicine

## 2023-10-08 NOTE — Progress Notes (Signed)
 Sedimentation rate of 17 looks good.  White blood count slightly low at 3.3.  The metabolic panel shows a slight alkalosis with bicarb of 39 but is otherwise normal.  No problem for continuing the Enbrel .

## 2023-10-10 ENCOUNTER — Other Ambulatory Visit (HOSPITAL_COMMUNITY): Payer: Self-pay

## 2023-10-10 LAB — CBC WITH DIFFERENTIAL/PLATELET
Absolute Lymphocytes: 1168 {cells}/uL (ref 850–3900)
Absolute Monocytes: 347 {cells}/uL (ref 200–950)
Basophils Absolute: 20 {cells}/uL (ref 0–200)
Basophils Relative: 0.6 %
Eosinophils Absolute: 158 {cells}/uL (ref 15–500)
Eosinophils Relative: 4.8 %
HCT: 37.7 % (ref 35.0–45.0)
Hemoglobin: 12.1 g/dL (ref 11.7–15.5)
MCH: 27.2 pg (ref 27.0–33.0)
MCHC: 32.1 g/dL (ref 32.0–36.0)
MCV: 84.7 fL (ref 80.0–100.0)
MPV: 8.8 fL (ref 7.5–12.5)
Monocytes Relative: 10.5 %
Neutro Abs: 1607 {cells}/uL (ref 1500–7800)
Neutrophils Relative %: 48.7 %
Platelets: 202 Thousand/uL (ref 140–400)
RBC: 4.45 Million/uL (ref 3.80–5.10)
RDW: 14.2 % (ref 11.0–15.0)
Total Lymphocyte: 35.4 %
WBC: 3.3 Thousand/uL — ABNORMAL LOW (ref 3.8–10.8)

## 2023-10-10 LAB — COMPREHENSIVE METABOLIC PANEL WITH GFR
AG Ratio: 1.7 (calc) (ref 1.0–2.5)
ALT: 17 U/L (ref 6–29)
AST: 21 U/L (ref 10–35)
Albumin: 4.3 g/dL (ref 3.6–5.1)
Alkaline phosphatase (APISO): 107 U/L (ref 37–153)
BUN: 21 mg/dL (ref 7–25)
CO2: 39 mmol/L — ABNORMAL HIGH (ref 20–32)
Calcium: 9.3 mg/dL (ref 8.6–10.4)
Chloride: 101 mmol/L (ref 98–110)
Creat: 0.71 mg/dL (ref 0.60–1.00)
Globulin: 2.5 g/dL (ref 1.9–3.7)
Glucose, Bld: 100 mg/dL — ABNORMAL HIGH (ref 65–99)
Potassium: 4.3 mmol/L (ref 3.5–5.3)
Sodium: 142 mmol/L (ref 135–146)
Total Bilirubin: 0.4 mg/dL (ref 0.2–1.2)
Total Protein: 6.8 g/dL (ref 6.1–8.1)
eGFR: 89 mL/min/1.73m2 (ref >=60)

## 2023-10-10 LAB — QUANTIFERON-TB GOLD PLUS
Mitogen-NIL: 9.32 [IU]/mL
NIL: 0.02 [IU]/mL
QuantiFERON-TB Gold Plus: NEGATIVE
TB1-NIL: 0.02 [IU]/mL
TB2-NIL: 0.03 [IU]/mL

## 2023-10-10 LAB — SEDIMENTATION RATE: Sed Rate: 17 mm/h (ref 0–30)

## 2023-10-15 ENCOUNTER — Encounter: Payer: Self-pay | Admitting: Neurology

## 2023-10-15 ENCOUNTER — Ambulatory Visit: Admitting: Neurology

## 2023-10-15 VITALS — BP 127/87 | HR 85 | Ht 64.0 in | Wt 161.0 lb

## 2023-10-15 DIAGNOSIS — H547 Unspecified visual loss: Secondary | ICD-10-CM | POA: Diagnosis not present

## 2023-10-15 DIAGNOSIS — S0990XA Unspecified injury of head, initial encounter: Secondary | ICD-10-CM | POA: Diagnosis not present

## 2023-10-15 NOTE — Progress Notes (Unsigned)
 [facility]    Provider:  Dr Ines Requesting Provider: Raj Rankin SAUNDERS, MD Primary Care Provider:  Chet Mad, DO  CC:  vision loss right eye  HPI:  Meghan Schwartz is a 73 y.o. female here as requested by Raj Rankin SAUNDERS, MD for vision changes after a fall, mri of the brain did not show anything acute. She is being treated for wet macular degenertion.  has CTS (carpal tunnel syndrome); MDD (major depressive disorder), recurrent severe, without psychosis (HCC); Benzodiazepine dependence (HCC); Chest pain; RA (rheumatoid arthritis) (HCC); Hyperlipidemia; Hypokalemia; Depression with anxiety; Tobacco abuse; Lower extremity cellulitis; Non-ST elevation (NSTEMI) myocardial infarction [facility]); Chronic pain of left knee; Mass of left knee; (HFpEF) heart failure with preserved ejection fraction (HCC); Coronary artery disease involving native coronary artery of native heart without angina pectoris; Meniscal cyst, unspecified laterality; Torn medial meniscus; OA (osteoarthritis) of knee; Derangement of posterior horn of medial meniscus; Derangement of posterior horn of lateral meniscus; Hypoxia; Bunion of great toe of right foot; Pain in left foot; High risk medication use; Dry mouth; Dog bite; Claw toe, left; Leg edema, left; Effusion, left knee; Hypocalcemia; Falls, initial encounter; Prolonged QT interval; Acute metabolic encephalopathy; Essential hypertension; AKI (acute kidney injury) (HCC); Chronic pain; Chronic diastolic CHF (congestive heart failure) (HCC); Primary osteoarthritis of left knee; and Traumatic injury of head with loss of vision on their problem list.  Several months ago she went to the bathroom and did not turn the lights on and she fell and hit the back of there head. The right eye was her good eye, what was her good eye is no longer her good eye, she was sent for an mri due to the acute vision loss of the right eye possibly traumatic from fall. The retinal  specialist told her that there was advanced atropy (MRI report states mild atrophy, I do not have the images). The right eye has vision loss per patient, her fall was the beginning of April. Vision in the right eye has not improved, more spread out vision changes she can see light but very blurry Happened right after she hit her head. She was having lots of falls due to knee instability and she was falling a lot since then she had total knee reolacement surgery and has not fallen since/recently. She points more to the left of midline in the occipital region where she hit her head. She is intact to finger count. She can see light in the right eye. It is very blurry. She can see light, she can see me and recognize the shape, and see I am wearing black and tell my skin is white but can't distinguish the details of my face. The whole visual field is affected per patient.   I reviewed notes from [place] [facility] and it was June 02, 2023, she was sent here for cortical atrophy, she presented with new onset blurred vision in the right eye, she is being treated for wet macular degeneration in the left eye and she is also on Plaquenil  and being followed for any changes due to medication effects, after being seen June 02, 2023 it was noted that her OD vision had significantly changed CF@1ft  where on prior examination December 12, 2022 it shows that OD 20/40-1 on vision test. She was sent for neuroimaging to rule out hemorrhage, mass or ischemia leading to her new visual field defect after falls and head trauma. MRI of the brian was unremarkable by report, no causes seen,  mild generalized atrophy and mild chronic microvascular ischemic changes (reviewed MRI report as below.  Reviewed notes, labs and imaging from outside physicians, which showed:  MRI brain showed : 06/13/2023 IMPRESSION:   1. No acute intracranial findings.  2. Mild cerebral atrophy with mild to moderate chronic white matter disease.  3.  Chronic paranasal sinusitis.   Review of Systems: Patient complains of symptoms per HPI as well as the following symptoms per hpi. Pertinent negatives and positives per HPI. All others negative.   Social History   Socioeconomic History   Marital status: Married    Spouse name: Carylon Tamburro   Number of children: 2   Years of education: Ba   Highest education level: Not on file  Occupational History   Occupation: Retired   Tobacco Use   Smoking status: Some Days    Current packs/day: 0.25    Average packs/day: 0.3 packs/day for 53.6 years (13.4 ttl pk-yrs)    Types: Cigarettes    Start date: 1972    Passive exposure: Never   Smokeless tobacco: Never  Vaping Use   Vaping status: Never Used  Substance and Sexual Activity   Alcohol  use: No    Alcohol /week: 0.0 standard drinks of alcohol    Drug use: No   Sexual activity: Not on file  Other Topics Concern   Not on file  Social History Narrative   Lives at home with spouse.    Right handed.   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 Strain: Not on file  Food Insecurity: No Food Insecurity (04/14/2023)   Hunger Vital Sign    Worried About Running Out of Food in the Last Year: Never true    Ran Out of Food in the Last Year: Never true  Transportation Needs: Patient Declined (04/14/2023)   PRAPARE - Administrator, Civil Service (Medical): Patient declined    Lack of Transportation (Non-Medical): Patient declined  Physical Activity: Not on file  Stress: Not on file  Social Connections: Patient Declined (04/14/2023)   Social Connection and Isolation Panel    Frequency of Communication with Friends and Family: Patient declined    Frequency of Social Gatherings with Friends and Family: Patient declined    Attends Religious Services: Patient declined    Database administrator or Organizations: Patient declined    Attends Banker  Meetings: Patient declined    Marital Status: Patient declined  Intimate Partner Violence: Patient Declined (04/14/2023)   Humiliation, Afraid, Rape, and Kick questionnaire    Fear of Current or Ex-Partner: Patient declined    Emotionally Abused: Patient declined    Physically Abused: Patient declined    Sexually Abused: Patient declined    Family History  Problem Relation Age of Onset   Thyroid  disease Mother    Breast cancer Mother    Parkinson's disease Father    Raynaud syndrome Maternal Aunt    Arthritis Maternal Grandmother    Heart attack Maternal Grandmother    Stroke Paternal Grandmother    Heart attack Paternal Grandfather     Past Medical History:  Diagnosis Date   Allergic rhinitis    Anxiety    Benzodiazepine dependence (HCC)    Bruxism    CAD (coronary artery disease)    S/p NSTEMI 01/2018 >> LHC: oLAD 20, pLAD 20; oD1 20, oLCx 40, pRCA 99 >> PCI:  DES to prox RCA // Echo 01/2018: EF 50-55; prob inf-lat and inf HK   Chronic diastolic heart failure    Echocardiogram 07/2018: EF 55-60, grade 1 diastolic dysfunction, normal wall motion, normal GLS (-22.3), PASP 28   Chronic foot pain    bunions, torn ligaments-on chronic pain medication   CTS (carpal tunnel syndrome) 06/08/2014   Depression    High cholesterol    Hypertension    Low blood potassium    MDD (major depressive disorder) 10/02/2017   Myocardial infarction (HCC) 02/01/2018   Pneumonia 1986   left lung   RA (rheumatoid arthritis) (HCC)     Patient Active Problem List   Diagnosis Date Noted   Traumatic injury of head with loss of vision 10/16/2023   Primary osteoarthritis of left knee 04/14/2023   Hypocalcemia 08/21/2022   Falls, initial encounter 08/21/2022   Prolonged QT interval 08/21/2022   Acute metabolic encephalopathy 08/21/2022   Essential hypertension 08/21/2022   AKI (acute kidney injury) (HCC) 08/21/2022   Chronic pain 08/21/2022   Chronic diastolic CHF (congestive heart failure)  (HCC) 08/21/2022   Effusion, left knee 06/12/2022   Leg edema, left 12/25/2021   Claw toe, left 08/07/2021   Dog bite 03/05/2021   High risk medication use 02/05/2021   Dry mouth 02/05/2021   Pain in left foot 01/31/2021   Bunion of great toe of right foot 06/15/2020   Hypoxia 03/05/2019   Derangement of posterior horn of medial meniscus 02/02/2019   Derangement of posterior horn of lateral meniscus 02/02/2019   Meniscal cyst, unspecified laterality 12/01/2018   Torn medial meniscus 12/01/2018   OA (osteoarthritis) of knee 12/01/2018   Coronary artery disease involving native coronary artery of native heart without angina pectoris 09/14/2018   (HFpEF) heart failure with preserved ejection fraction (HCC)    Chronic pain of left knee 05/15/2018   Mass of left knee 05/15/2018   Non-ST elevation (NSTEMI) myocardial infarction [facility])    Chest pain 01/25/2018   RA (rheumatoid arthritis) (HCC) 01/25/2018   Hyperlipidemia 01/25/2018   Hypokalemia 01/25/2018   Depression with anxiety 01/25/2018   Tobacco abuse 01/25/2018   Lower extremity cellulitis 01/25/2018   Benzodiazepine dependence (HCC)    MDD (major depressive disorder), recurrent severe, without psychosis (HCC) 10/02/2017   CTS (carpal tunnel syndrome) 06/08/2014    Past Surgical History:  Procedure Laterality Date   BTL  2000   CARPAL TUNNEL RELEASE Left    CESAREAN SECTION  1978. 1983   CORONARY STENT INTERVENTION N/A 01/27/2018   Procedure: CORONARY STENT INTERVENTION;  Surgeon: Verlin Lonni BIRCH, MD;  Location: [facility];  Service: Cardiovascular;  Laterality: N/A;   eye implants     FOOT SURGERY Left 2008   front teeth removed after injury     KNEE ARTHROSCOPY WITH LATERAL MENISECTOMY Left 02/02/2019   Procedure: LEFT KNEE ARTHROSCOPY, MEDIAL AND LATERAL MENISECTOMY, EXCISION OF LEFT LATERAL MENISCAL CYST;  Surgeon: Anderson Maude ORN, MD;  Location: [facility];  Service: Orthopedics;  Laterality: Left;   LEFT  HEART CATH AND CORONARY ANGIOGRAPHY N/A 01/27/2018   Procedure: LEFT HEART CATH AND CORONARY ANGIOGRAPHY;  Surgeon: Verlin Lonni BIRCH, MD;  Location: [facility];  Service: Cardiovascular;  Laterality: N/A;   pre cancerous lesion removed from forehead     TOTAL ABDOMINAL HYSTERECTOMY W/ BILATERAL SALPINGOOPHORECTOMY  2002   Dr. Horacio   TOTAL KNEE ARTHROPLASTY Left 04/14/2023   Procedure: TOTAL KNEE ARTHROPLASTY;  Surgeon: Melodi Lerner, MD;  Location: [facility];  Service: Orthopedics;  Laterality: Left;   VAGINAL HYSTERECTOMY  2001    Current Outpatient Medications  Medication Sig Dispense Refill   acetaminophen  (TYLENOL ) 500 MG tablet Take 500 mg by mouth every 6 (six) hours as needed (For knee pain).     Buprenorphine  HCl-Naloxone  HCl 5.7-1.4 MG SUBL Place 1 tablet under the tongue 3 (three) times daily. 90 tablet 0   Carboxymethylcellulose Sodium (THERATEARS) 0.25 % SOLN Place 2 drops into the left eye daily.     cetirizine (ZYRTEC) 10 MG tablet Take 10 mg by mouth daily.     Cholecalciferol (VITAMIN D) 50 MCG (2000 UT) tablet Take 2,000 Units by mouth daily.     citalopram  (CELEXA ) 10 MG tablet TK 1 T PO TID     clonazePAM  (KLONOPIN ) 1 MG tablet Take 1 tablet (1 mg total) by mouth at bedtime. (Patient taking differently: Take 0.5-1 mg by mouth See admin instructions. Take 1 mg every night and an additional 0.5 mg at lunch as needed for anxiety) 30 tablet 0   clopidogrel  (PLAVIX ) 75 MG tablet TAKE 1 TABLET BY MOUTH EVERY DAY 90 tablet 3   cyclobenzaprine  (FLEXERIL ) 10 MG tablet Take 1 tablet (10 mg total) by mouth 3 (three) times daily as needed for spasms 30 tablet 0   docusate sodium  ([employer] STOOL SOFTENER) 100 MG capsule Take 100 mg by mouth at bedtime.     escitalopram  (LEXAPRO ) 10 MG tablet Take 15 mg by mouth every morning.     estradiol  (VIVELLE -DOT) 0.075 MG/24HR Place 1 patch onto the skin 2 (two) times a week. Tues. & Sat.     etanercept  (ENBREL  SURECLICK) 50 MG/ML  injection Inject 50 mg into the skin once a week. 4 mL 2   HORIZANT  600 MG TBCR Take 1 tablet by mouth every evening. GABAPENTIN      HORIZANT  600 MG TBCR Take 1 tablet (600 mg total) by mouth 2 (two) times daily. 60 tablet 0   methocarbamol  (ROBAXIN ) 500 MG tablet Take 1 tablet (500 mg total) by mouth every 6 (six) hours as needed for muscle spasms. 40 tablet 0   metoprolol  tartrate (LOPRESSOR ) 25 MG tablet TAKE 1 TABLET BY MOUTH EVERY DAY 90 tablet 3   Multiple Vitamins-Minerals (MULTIVITAMIN WITH MINERALS) tablet Take 1 tablet by mouth daily. Woman 50+     nitroGLYCERIN  (NITROSTAT ) 0.4 MG SL tablet Place 1 tablet (0.4 mg total) under the tongue every 5 (five) minutes as needed for chest pain. 30 tablet 3   ondansetron  (ZOFRAN ) 4 MG tablet Take 1 tablet (4 mg total) by mouth every 6 (six) hours as needed for nausea. 20 tablet 0   ondansetron  (ZOFRAN -ODT) 4 MG disintegrating tablet Take by mouth daily.     potassium chloride  SA (KLOR-CON  M) 20 MEQ tablet Take 1 tablet (20 mEq total) by mouth daily.     Probiotic Product (ALIGN) 4 MG CAPS Take 4 mg by mouth daily.     rosuvastatin  (CRESTOR ) 40 MG tablet TAKE 1 TABLET BY MOUTH EVERY DAY 90 tablet 3   torsemide  (DEMADEX ) 20 MG tablet TAKE 1 TABLET BY MOUTH TWICE A DAY 180 tablet 1   triamcinolone  cream (KENALOG ) 0.1 % Apply 1 Application topically daily as needed (Itching skin).     VITAMIN D, ERGOCALCIFEROL, PO Vitamin D (Ergocalciferol)     ZUBSOLV  5.7-1.4 MG SUBL Place 1 tablet under the tongue 3 (three) times daily.     No current facility-administered medications for this visit.  Allergies as of 10/15/2023 - Review Complete 10/15/2023  Allergen Reaction Noted   Erythromycin Nausea And Vomiting 09/23/2016    Vitals: BP 127/87   Pulse 85   Ht 5' 4 (1.626 m)   Wt 161 lb (73 kg)   BMI 27.64 kg/m  Last Weight:  Wt Readings from Last 1 Encounters:  10/15/23 161 lb (73 kg)   Last Height:   Ht Readings from Last 1 Encounters:   10/15/23 5' 4 (1.626 m)     Physical exam: Exam: Gen: NAD, conversant, well nourised, obese, well groomed                     CV: RRR, no MRG. No Carotid Bruits. No peripheral edema, warm, nontender Eyes: Conjunctivae clear without exudates or hemorrhage  Neuro: Detailed Neurologic Exam  Speech:    Speech is normal; fluent and spontaneous with normal comprehension.  Cognition: Patient cannot provide history, she is alert and oriented, she can provide for specific dates and discussions with physicians including ophthalmologist who referred her here    05/04/2018    8:45 AM  MMSE - Mini Mental State Exam  Orientation to time 4  Orientation to Place 5  Registration 3  Attention/ Calculation 5  Recall 2  Language- name 2 objects 2  Language- repeat 0  Language- follow 3 step command 3  Language- read & follow direction 1  Write a sentence 0  Copy design 1  Total score 26    Cranial Nerves:    The pupils are equal, round, and reactive to light.  Pupils too small to visualize attempted.  Visual fields are full to finger confrontation. Extraocular movements are intact. Trigeminal sensation is intact and the muscles of mastication are normal. The face is symmetric. The palate elevates in the midline. Hearing intact. Voice is normal. Shoulder shrug is normal. The tongue has normal motion without fasciculations.   Coordination: Normal Gait: Antalgic, bradykinetic knee healing , intact posture  Motor Observation:    No asymmetry, no atrophy, and no involuntary movements noted. Tone:    Normal muscle tone.    Posture:    Posture is normal. normal erect    Strength:    Strength is V/V in the upper and lower limbs.      Sensation: intact to LT     Reflex Exam:  DTR's:    Absent AJs. Deep tendon reflexes in the upper and lower extremities are symmetrical bilaterally.   Toes:    The toes are downgoing bilaterally.   Clonus:    Clonus is absent.    Assessment/Plan:  Patient was a acute onset vision loss in the right eye, which appears diffuse and not in 1 quadrant or hemifield, after a fall hitting her head in the occipital region.  MRI of the brain showed no acute findings. I do not have the brain images to review, have asked team to see if they can get them loaded into [facility] for me or get a dick.   The patient reports monocular vision loss in the right eye following occipital head trauma. The vision loss is diffuse across the entire visual field rather than in a homonymous or quadrant field cut pattern, making a cortical (occipital lobe) etiology unlikely. She retains light perception, color vision, and can count fingers, though vision remains markedly blurred.  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 The initial description of a central dark circle  evolving into diffuse blurring also raises consideration of retinal involvement (e.g., traumatic maculopathy, commotio retinae, or vitreous changes) and patient has been evaluated since by ophthalmology  Plan:  Recommend comprehensive ophthalmologic evaluation, including dilated fundus exam and OCT, to assess for retinal or macular injury. Continue to follow with Ophthalmology  Consider visual field testing and optic nerve imaging (OCT of RNFL/GCC).  Monitor for progression; vision recovery can be variable in TON.  Discuss prognosis, as traumatic optic neuropathy often results in partial or permanent vision deficit despite preserved light perception.  No orders of the defined types were placed in this encounter.  No orders of the defined types were placed in this encounter.   Cc: Raj Rankin SAUNDERS, MD,  Chet Mad, DO, Thom Simpers, Oneil Roz Rankin Raj Onetha Ines, MD  [facility] [address] [place], [place] [address]  Phone [phone] Fax [phone]  I spent 65 minutes of  face-to-face and non-face-to-face time with patient on the  1. Traumatic injury of head with loss of vision    diagnosis.  This included previsit chart review, lab review, study review, order entry, electronic health record documentation, patient education on the different diagnostic and therapeutic options, counseling and coordination of care, risks and benefits of management, compliance, or risk factor reduction

## 2023-10-16 ENCOUNTER — Telehealth: Payer: Self-pay | Admitting: Neurology

## 2023-10-16 DIAGNOSIS — Z0000 Encounter for general adult medical examination without abnormal findings: Secondary | ICD-10-CM | POA: Diagnosis not present

## 2023-10-16 DIAGNOSIS — H547 Unspecified visual loss: Secondary | ICD-10-CM | POA: Insufficient documentation

## 2023-10-16 NOTE — Telephone Encounter (Signed)
 Please let this lovelypatient know that I am trying to get her brain images to review. I do think that her vision loss is due to her fall anf head injury. If I see anything different on the images than wa in the report I will let her know and I will let her physician know. Thank you

## 2023-10-17 NOTE — Telephone Encounter (Signed)
 Called pt and LVM (OK per DPR) advising her of Dr Sharion message below. Also sent mychart message.

## 2023-10-20 ENCOUNTER — Other Ambulatory Visit (HOSPITAL_COMMUNITY): Payer: Self-pay

## 2023-10-20 DIAGNOSIS — F331 Major depressive disorder, recurrent, moderate: Secondary | ICD-10-CM | POA: Diagnosis not present

## 2023-10-22 ENCOUNTER — Other Ambulatory Visit: Payer: Self-pay

## 2023-10-22 ENCOUNTER — Other Ambulatory Visit: Payer: Self-pay | Admitting: Pharmacy Technician

## 2023-10-22 DIAGNOSIS — L570 Actinic keratosis: Secondary | ICD-10-CM | POA: Diagnosis not present

## 2023-10-22 DIAGNOSIS — L821 Other seborrheic keratosis: Secondary | ICD-10-CM | POA: Diagnosis not present

## 2023-10-22 DIAGNOSIS — L578 Other skin changes due to chronic exposure to nonionizing radiation: Secondary | ICD-10-CM | POA: Diagnosis not present

## 2023-10-22 NOTE — Progress Notes (Signed)
 Specialty Pharmacy Refill Coordination Note  Meghan Schwartz is a 73 y.o. female contacted today regarding refills of specialty medication(s) Etanercept  (Enbrel  SureClick)   Patient requested Delivery   Delivery date: 10/30/23   Verified address: [address]  [place] [address]   Medication will be filled on 10/29/23. Next injections: 9/2 & 9/9

## 2023-10-23 DIAGNOSIS — Z79899 Other long term (current) drug therapy: Secondary | ICD-10-CM | POA: Diagnosis not present

## 2023-10-23 DIAGNOSIS — H353221 Exudative age-related macular degeneration, left eye, with active choroidal neovascularization: Secondary | ICD-10-CM | POA: Diagnosis not present

## 2023-10-23 DIAGNOSIS — H53453 Other localized visual field defect, bilateral: Secondary | ICD-10-CM | POA: Diagnosis not present

## 2023-10-23 DIAGNOSIS — H43393 Other vitreous opacities, bilateral: Secondary | ICD-10-CM | POA: Diagnosis not present

## 2023-10-23 DIAGNOSIS — H43813 Vitreous degeneration, bilateral: Secondary | ICD-10-CM | POA: Diagnosis not present

## 2023-10-23 DIAGNOSIS — H353212 Exudative age-related macular degeneration, right eye, with inactive choroidal neovascularization: Secondary | ICD-10-CM | POA: Diagnosis not present

## 2023-11-14 DIAGNOSIS — H26491 Other secondary cataract, right eye: Secondary | ICD-10-CM | POA: Diagnosis not present

## 2023-11-14 DIAGNOSIS — H47011 Ischemic optic neuropathy, right eye: Secondary | ICD-10-CM | POA: Diagnosis not present

## 2023-11-14 DIAGNOSIS — Z961 Presence of intraocular lens: Secondary | ICD-10-CM | POA: Diagnosis not present

## 2023-11-14 DIAGNOSIS — H52202 Unspecified astigmatism, left eye: Secondary | ICD-10-CM | POA: Diagnosis not present

## 2023-11-14 DIAGNOSIS — H4720 Unspecified optic atrophy: Secondary | ICD-10-CM | POA: Diagnosis not present

## 2023-11-17 DIAGNOSIS — F331 Major depressive disorder, recurrent, moderate: Secondary | ICD-10-CM | POA: Diagnosis not present

## 2023-11-21 ENCOUNTER — Other Ambulatory Visit: Payer: Self-pay

## 2023-11-25 ENCOUNTER — Other Ambulatory Visit: Payer: Self-pay

## 2023-11-27 DIAGNOSIS — M1711 Unilateral primary osteoarthritis, right knee: Secondary | ICD-10-CM | POA: Diagnosis not present

## 2023-11-27 DIAGNOSIS — Z96652 Presence of left artificial knee joint: Secondary | ICD-10-CM | POA: Diagnosis not present

## 2023-12-01 DIAGNOSIS — F331 Major depressive disorder, recurrent, moderate: Secondary | ICD-10-CM | POA: Diagnosis not present

## 2023-12-04 DIAGNOSIS — M1711 Unilateral primary osteoarthritis, right knee: Secondary | ICD-10-CM | POA: Diagnosis not present

## 2023-12-05 DIAGNOSIS — H5310 Unspecified subjective visual disturbances: Secondary | ICD-10-CM | POA: Diagnosis not present

## 2023-12-10 ENCOUNTER — Other Ambulatory Visit: Payer: Self-pay

## 2023-12-11 ENCOUNTER — Other Ambulatory Visit (HOSPITAL_COMMUNITY): Payer: Self-pay

## 2023-12-11 DIAGNOSIS — M1711 Unilateral primary osteoarthritis, right knee: Secondary | ICD-10-CM | POA: Diagnosis not present

## 2023-12-12 ENCOUNTER — Other Ambulatory Visit (HOSPITAL_COMMUNITY): Payer: Self-pay

## 2023-12-18 ENCOUNTER — Ambulatory Visit: Admitting: Podiatry

## 2023-12-18 ENCOUNTER — Ambulatory Visit (INDEPENDENT_AMBULATORY_CARE_PROVIDER_SITE_OTHER)

## 2023-12-18 ENCOUNTER — Encounter: Payer: Self-pay | Admitting: Podiatry

## 2023-12-18 DIAGNOSIS — D689 Coagulation defect, unspecified: Secondary | ICD-10-CM

## 2023-12-18 DIAGNOSIS — M2041 Other hammer toe(s) (acquired), right foot: Secondary | ICD-10-CM | POA: Diagnosis not present

## 2023-12-18 DIAGNOSIS — L84 Corns and callosities: Secondary | ICD-10-CM | POA: Diagnosis not present

## 2023-12-18 DIAGNOSIS — Z23 Encounter for immunization: Secondary | ICD-10-CM | POA: Diagnosis not present

## 2023-12-18 DIAGNOSIS — M2042 Other hammer toe(s) (acquired), left foot: Secondary | ICD-10-CM | POA: Diagnosis not present

## 2023-12-19 ENCOUNTER — Other Ambulatory Visit: Payer: Self-pay

## 2023-12-19 ENCOUNTER — Other Ambulatory Visit (HOSPITAL_COMMUNITY): Payer: Self-pay

## 2023-12-19 NOTE — Progress Notes (Signed)
 Specialty Pharmacy Refill Coordination Note  Meghan Schwartz is a 73 y.o. female contacted today regarding refills of specialty medication(s) Etanercept  (Enbrel  SureClick)   Patient requested Delivery   Delivery date: 12/23/23   Verified address: [address]  [place] [address]   Medication will be filled on 10.27.25.

## 2023-12-19 NOTE — Progress Notes (Signed)
 Subjective:   Patient ID: Meghan Schwartz, female   DOB: 73 y.o.   MRN: [id number]   HPI Patient states that she is worried she fractured her fourth toe left foot due to pain and also has a lesion digital and submetatarsal that is painful on that left foot that she cannot take care of   ROS      Objective:  Physical Exam  Neurovascular status unchanged patient is on blood thinner high risk has lesion distal fourth digit slightly under the metatarsal and mild swelling of the toe itself     Assessment:  Possibility that this is a fracture versus a digital hammertoe deformity with keratotic tissue formation     Plan:  H&P x-ray taken reviewed indicating no signs of fracture today I did sterile debridement of several lesions and took off the distal portion of the digit and then applied Band-Aid.  I applied cushioning to the toe I explained the pathology associated with this to her and caregiver and patient will be seen back if symptoms continue with patient being on blood thinner so at high risk for her doing any debridement herself

## 2023-12-22 ENCOUNTER — Other Ambulatory Visit: Payer: Self-pay

## 2023-12-23 ENCOUNTER — Other Ambulatory Visit: Payer: Self-pay | Admitting: Family Medicine

## 2023-12-23 DIAGNOSIS — S0033XA Contusion of nose, initial encounter: Secondary | ICD-10-CM

## 2023-12-23 DIAGNOSIS — S0990XA Unspecified injury of head, initial encounter: Secondary | ICD-10-CM | POA: Diagnosis not present

## 2023-12-23 DIAGNOSIS — Z9181 History of falling: Secondary | ICD-10-CM | POA: Diagnosis not present

## 2023-12-25 ENCOUNTER — Other Ambulatory Visit

## 2023-12-25 ENCOUNTER — Ambulatory Visit
Admission: RE | Admit: 2023-12-25 | Discharge: 2023-12-25 | Disposition: A | Discharge status: Home / Self Care | Source: Ambulatory Visit | Attending: Family Medicine | Admitting: Family Medicine

## 2023-12-25 DIAGNOSIS — Z043 Encounter for examination and observation following other accident: Secondary | ICD-10-CM | POA: Diagnosis not present

## 2023-12-25 DIAGNOSIS — G9389 Other specified disorders of brain: Secondary | ICD-10-CM | POA: Diagnosis not present

## 2023-12-25 DIAGNOSIS — S0033XA Contusion of nose, initial encounter: Secondary | ICD-10-CM

## 2023-12-25 DIAGNOSIS — S0990XA Unspecified injury of head, initial encounter: Secondary | ICD-10-CM

## 2023-12-30 NOTE — Progress Notes (Deleted)
 Office Visit Note  Patient: Meghan Schwartz             Date of Birth: 1950/05/30           MRN: [id number]             PCP: Chet Mad, DO Referring: Chet Mad, DO Visit Date: 01/12/2024   Subjective:  No chief complaint on file.   History of Present Illness: Meghan Schwartz is a 73 y.o. female here for follow up for RA on Enbrel  50 mg [place] weekly and severe knee osteoarthritis.     Previous HPI 10/07/2023 Meghan Schwartz is a 73 y.o. female here for follow up for RA on Enbrel  50 mg [place] weekly and severe knee osteoarthritis.     She underwent knee replacement surgery in February and has experienced significant improvement in her condition. She had excellent postop recovery with good knee ROM achieved and she was discharged from rehabilitation. She did not require prolonged use of pain medication post-surgery.   Following the knee replacement, she experienced a fall while visiting a neighbor, resulting in a hematoma on her right leg. X-rays confirmed the diagnosis, and it was noted that it would take months to resolve. She has been receiving follow-up care, and the condition is reportedly improving.   She continues to use Enbrel  injections and reports no major infections since the last visit, although she did contract COVID-19. She initially mistook the symptoms for a sinus infection and was diagnosed at a walk-in clinic. She was treated with Paxlovid and amoxicillin, which resolved her symptoms, and she quarantined herself during the illness.   She received a cortisone injection for the right knee approximately three to four weeks ago, which has since worn off. During that visit she reports 42 milliliters of fluid were aspirated from her knee.        Previous HPI 03/19/2023 Meghan Schwartz is a 73 y.o. female here for follow up for RA on Enbrel  50 mg [place] weekly and severe osteoarthritis. She is scheduled for a knee replacement surgery on 2/17.  They also have a Baker's cyst,  which causes sometimes causes more discomfort than the knee surface itself, and they wonder if it will be addressed during the surgery.   The patient is currently taking Enbrel  for their arthritis, but they have been advised to pause this medication two weeks before and after their surgery to minimize the risk of delayed wound healing or bacterial infection.   The patient also reports pain on the side of their leg, which they describe as a pressing sensation in three specific areas. They note that the pain is more pronounced when they walk.   In addition to their arthritis, the patient has a history of low potassium levels. They recently experienced an overnight weight gain of eight pounds, which they attribute to a change in their medication regimen. They had been taking torsemide  and potassium daily, but a new physician's assistant recommended they take these medications only on Monday, Wednesday, and Friday. After the weight gain, they returned to their daily regimen and lost the weight.   They also report that their skin is shedding and they have some scabbing and lesions, which they are trying not to scratch.     Previous HPI 12/18/22 Meghan Schwartz is a 73 y.o. female here for follow up for rheumatoid arthritis on Enbrel  50 mg [place] weekly and regarding chronic osteoarthritis pain in the left knee.  Again had  temporary symptom relief after aspiration injection of knee at her last visit but still has pain and instability and catching while weightbearing.  She developed trouble with her left eye with what she describes as some type of membrane associated with the previous cataract surgery.  Seeing Dr. Octavia for management.  She is also discussing knee replacement surgery with Dr. Theotis.  They talked at some length regarding she has high pre-existing cardiovascular risk factors for surgery but current function and quality of life is severely impaired and she has had only limited response to numerous  injections and oral medications and does not want to go back on high-dose pain medicine.   Previous HPI 10/30/22 Meghan Schwartz is a 73 y.o. female here for follow up today for left knee pain and swelling. This is worse especially with pain in the posterior knee in the past week. This was not preceded by any specific injury illness or new activity. She is not wearing the knee brace recommended by Dr. Addie because it is too bulky on her swollen knee and hits against the right knee. Also has pain and tenderness in an area below and lateral to the knee that is chronic but worse lately. She remains on enbrel  50 mg [place] weekly without interruption for her RA and no increased swelling elsewhere.   Previous HPI 09/11/22 Meghan Schwartz is a 73 y.o. female here for follow up for rheumatoid arthritis on Enbrel  50 mg [place] weekly and osteoarthritis of the left knee.  We last saw her in April with repeat aspiration of the knee and popliteal cyst with no steroid injection at the time due to recent treatment.  She feels the cyst and back of knee pain has remained improved since that time.  She had a hospitalization last month due to severe hypokalemia down to 2.0.  Had preceding symptoms of worsening muscle weakness with change in posture and worsening mobility for the preceding weeks until waking up with profound muscle weakness inability to stand and severe constipation.  Had some adjustment with her torsemide  and potassium supplementation and now back to about her baseline.   Previous HPI 07/30/22 Meghan Schwartz is a 73 y.o. female with rheumatoid arthritis on Enbrel  50 mg [place] weekly and osteoarthritis here for left knee joint pain and swelling.  At her last visit she felt there was more improvement in her knee pain with the Baker's cyst aspiration as well as joint injection versus previous aspiration injections at only the suprapatellar pouch.  Subsequently saw Dr. Addie for evaluation of her severe primary osteoarthritis of  the knee and also underwent joint aspiration that time with 45 cc fluid removed.  He discussed options for her realistically the only available procedure would be total knee arthroplasty.  She is hesitant about whether or not to pursue this due to associated pain and required rehabilitation and also cardiovascular risk.  Though she was cleared by her cardiologist to proceed with this if she wishes.  Currently has back due to increasing pain and swelling in the knee again and she is interested whether the popliteal cyst could be aspirated or injected again.  She is not noticing increased pain or swelling in other joints besides the knee.  No interruption or change to her Enbrel  or other medications. No new infections or injuries.   Previous HPI 06/12/22 Meghan Schwartz is a 73 y.o. female here for follow up for RA on Enbrel  50 mg [place] weekly and osteoarthritis with chronic  left knee pain and swelling. We aspirated the left knee in March but she saw quick re accumulation of fluid.  Outside of left knee has not experienced much exacerbation of upper extremity pain.  Also describes some clicking or popping sensation frequently occurring and with decreased steadiness on her feet.  She is usually able to walk adequately with some form of assistive device at least a cane.   Previous HPI 05/06/22 Meghan Schwartz is a 73 y.o. female here for follow up for RA on Enbrel  50 mg [place] weekly and osteoarthritis.  Since her last visit she had an issue with worsening pedal edema and a very large associated weight gain.  She was referred to see Dr. Okey with cardiology.  She was prescribed metolazone  to combine with her torsemide  and saw a rapid diuresis with 15 pound weight loss in 1 day.  Initially had associated skin peeling since then still has erythema and rough or thick skin over the affected areas but without any reaccumulation of the weight and pitting fluid.  Blood pressure has been somewhat low.  She has had some falls due to  instability she is noticing trouble where she cannot apply weight onto her right foot evenly just gets pressure around the base of the second and third toe.  And left knee is causing more problems with reaccumulation of the large effusion and also with posterior swelling from baker's cyst.     Previous HPI 02/05/21 Meghan Schwartz is a 73 y.o. female her for rheumatoid arthritis on Enbrel  50 mg [place] weekly for which she has been seeing Dr. Ishmael previously. She was originally diagnosed about 10 or 11 years ago due to development of joint pain, stiffness, synovitis, and progressive joint deformities primarily starting in the feet and knees.  Subsequently proceeded to have increased joint pains involving bilateral hands with some swelling at the PIP and MCP joints.  She started treatment with Enbrel  injections with some improvement in the lower extremity joint pains she recalls still having a lot of pain symptoms and pronounced fatigue that did not clear with this treatment.  Combination treatment was tried with methotrexate with significant GI intolerance.  She tried leflunomide but developed substantial alopecia so discontinued the medicine.  She switched to hydroxychloroquine  combination treatment which was being well-tolerated but she has had multiple new or worsening medical problems over the past few years with concern of medication related effects. She had hand numbness in the past improved after left carpal tunnel release surgery but she has some residual muscle atrophy in thenar prominence. She had left knee meniscectomy in 2020. She suffered NSTEMI in 01/2018 with DES placement in RCA for 99% blockage and has chronic diastolic CHF. Her edema is generally controlled while taking torsemide  but does rapidly re accumulate fluid off this medication. Ophthalmology evaluation indicated significant visual field deficits findings consistent with age-related macular degeneration of both eyes and recommended  avoidance of this medication.  She also has considerable osteoarthritis especially in her knees and feet previous surgical reconstruction on the left foot. Overall she feels her gait is not very stable due to these problems.   DMARD Hx Enbrel  - current   HCQ - macular disease? LEF - alopecia MTX - GI intolerance.    No Rheumatology ROS completed.   PMFS History:  Patient Active Problem List   Diagnosis Date Noted   Traumatic injury of head with loss of vision 10/16/2023   Primary osteoarthritis of left knee 04/14/2023  Hypocalcemia 08/21/2022   Falls, initial encounter 08/21/2022   Prolonged QT interval 08/21/2022   Acute metabolic encephalopathy 08/21/2022   Essential hypertension 08/21/2022   AKI (acute kidney injury) 08/21/2022   Chronic pain 08/21/2022   Chronic diastolic CHF (congestive heart failure) (HCC) 08/21/2022   Effusion, left knee 06/12/2022   Leg edema, left 12/25/2021   Claw toe, left 08/07/2021   Dog bite 03/05/2021   High risk medication use 02/05/2021   Dry mouth 02/05/2021   Pain in left foot 01/31/2021   Bunion of great toe of right foot 06/15/2020   Hypoxia 03/05/2019   Derangement of posterior horn of medial meniscus 02/02/2019   Derangement of posterior horn of lateral meniscus 02/02/2019   Meniscal cyst, unspecified laterality 12/01/2018   Torn medial meniscus 12/01/2018   OA (osteoarthritis) of knee 12/01/2018   Coronary artery disease involving native coronary artery of native heart without angina pectoris 09/14/2018   (HFpEF) heart failure with preserved ejection fraction (HCC)    Chronic pain of left knee 05/15/2018   Mass of left knee 05/15/2018   Non-ST elevation (NSTEMI) myocardial infarction [facility])    Chest pain 01/25/2018   RA (rheumatoid arthritis) (HCC) 01/25/2018   Hyperlipidemia 01/25/2018   Hypokalemia 01/25/2018   Depression with anxiety 01/25/2018   Tobacco abuse 01/25/2018   Lower extremity cellulitis 01/25/2018    Benzodiazepine dependence (HCC)    MDD (major depressive disorder), recurrent severe, without psychosis (HCC) 10/02/2017   CTS (carpal tunnel syndrome) 06/08/2014    Past Medical History:  Diagnosis Date   Allergic rhinitis    Anxiety    Benzodiazepine dependence (HCC)    Bruxism    CAD (coronary artery disease)    S/p NSTEMI 01/2018 >> LHC: oLAD 20, pLAD 20; oD1 20, oLCx 40, pRCA 99 >> PCI: DES to prox RCA // Echo 01/2018: EF 50-55; prob inf-lat and inf HK   Chronic diastolic heart failure    Echocardiogram 07/2018: EF 55-60, grade 1 diastolic dysfunction, normal wall motion, normal GLS (-22.3), PASP 28   Chronic foot pain    bunions, torn ligaments-on chronic pain medication   CTS (carpal tunnel syndrome) 06/08/2014   Depression    High cholesterol    Hypertension    Low blood potassium    MDD (major depressive disorder) 10/02/2017   Myocardial infarction (HCC) 02/01/2018   Pneumonia 1986   left lung   RA (rheumatoid arthritis) (HCC)     Family History  Problem Relation Age of Onset   Thyroid  disease Mother    Breast cancer Mother    Parkinson's disease Father    Raynaud syndrome Maternal Aunt    Arthritis Maternal Grandmother    Heart attack Maternal Grandmother    Stroke Paternal Grandmother    Heart attack Paternal Grandfather    Past Surgical History:  Procedure Laterality Date   BTL  2000   CARPAL TUNNEL RELEASE Left    CESAREAN SECTION  1978. 1983   CORONARY STENT INTERVENTION N/A 01/27/2018   Procedure: CORONARY STENT INTERVENTION;  Surgeon: Verlin Lonni BIRCH, MD;  Location: [facility];  Service: Cardiovascular;  Laterality: N/A;   eye implants     FOOT SURGERY Left 2008   front teeth removed after injury     KNEE ARTHROSCOPY WITH LATERAL MENISECTOMY Left 02/02/2019   Procedure: LEFT KNEE ARTHROSCOPY, MEDIAL AND LATERAL MENISECTOMY, EXCISION OF LEFT LATERAL MENISCAL CYST;  Surgeon: Anderson Maude ORN, MD;  Location: [facility];  Service: Orthopedics;  Laterality: Left;   LEFT HEART CATH AND CORONARY ANGIOGRAPHY N/A 01/27/2018   Procedure: LEFT HEART CATH AND CORONARY ANGIOGRAPHY;  Surgeon: Verlin Lonni BIRCH, MD;  Location: [facility];  Service: Cardiovascular;  Laterality: N/A;   pre cancerous lesion removed from forehead     TOTAL ABDOMINAL HYSTERECTOMY W/ BILATERAL SALPINGOOPHORECTOMY  2002   Dr. Horacio   TOTAL KNEE ARTHROPLASTY Left 04/14/2023   Procedure: TOTAL KNEE ARTHROPLASTY;  Surgeon: Melodi Lerner, MD;  Location: [facility];  Service: Orthopedics;  Laterality: Left;   VAGINAL HYSTERECTOMY  2001   Social History   Social History Narrative   Lives at home with spouse.    Right handed.   Caffeine use: 2 cups coffee/day    8 oz soda/day    Retired    Immunization History  Administered Date(s) Administered   PFIZER(Purple Top)SARS-COV-2 Vaccination 04/10/2019, 05/05/2019, 10/19/2019, 06/20/2020   Pfizer Covid-19 Vaccine Bivalent Booster 35yrs & up 11/29/2020   Tdap 04/03/2018     Objective: Vital Signs: There were no vitals taken for this visit.   Physical Exam   Musculoskeletal Exam: ***  CDAI Exam: CDAI Score: -- Patient Global: --; Provider Global: -- Swollen: --; Tender: -- Joint Exam 01/12/2024   No joint exam has been documented for this visit   There is currently no information documented on the homunculus. Go to the Rheumatology activity and complete the homunculus joint exam.  Investigation: No additional findings.  Imaging: CT HEAD WO CONTRAST ( ) Result Date: 12/25/2023 EXAM: CT HEAD WITHOUT CONTRAST 12/25/2023 03:18:04 PM TECHNIQUE: CT of the head was performed without the administration of intravenous contrast. Automated exposure control, iterative reconstruction, and/or weight based adjustment of the mA/kV was utilized to reduce the radiation dose to as low as reasonably achievable. COMPARISON: 08/20/2022. CLINICAL HISTORY: FINDINGS: BRAIN AND VENTRICLES: No acute hemorrhage. No  evidence of acute infarct. No hydrocephalus. No extra-axial collection. No mass effect or midline shift. ORBITS: Bilateral lens replacement. SINUSES: No acute abnormality. SOFT TISSUES AND SKULL: No acute soft tissue abnormality. No skull fracture. IMPRESSION: 1. No acute intracranial abnormality. 2. Bilateral lens replacement. Electronically signed by: Franky Stanford MD 12/25/2023 03:27 PM EDT RP Workstation: [id number]   CT MAXILLOFACIAL WO CONTRAST Result Date: 12/25/2023 EXAM: CT OF THE FACE WITHOUT CONTRAST 12/25/2023 03:16:18 PM TECHNIQUE: CT of the face was performed without the administration of intravenous contrast. Multiplanar reformatted images are provided for review. Automated exposure control, iterative reconstruction, and/or weight based adjustment of the mA/kV was utilized to reduce the radiation dose to as low as reasonably achievable. COMPARISON: None available. CLINICAL HISTORY: fall FINDINGS: FACIAL BONES: No acute facial fracture. No mandibular dislocation. No suspicious bone lesion. ORBITS: Globes are intact. No acute traumatic injury. No inflammatory change. SINUSES AND MASTOIDS: Mild mucosal thickening of the frontal ethmoid and sphenoid sinuses. SOFT TISSUES: No acute abnormality. IMPRESSION: 1. No acute facial fracture. Electronically signed by: Franky Stanford MD 12/25/2023 03:26 PM EDT RP Workstation: [id number]   DG Foot 2 Views Left Result Date: 12/18/2023 Please see detailed radiograph report in office note.   Recent Labs: Lab Results  Component Value Date   WBC 3.3 (L) 10/07/2023   HGB 12.1 10/07/2023   PLT 202 10/07/2023   NA 142 10/07/2023   K 4.3 10/07/2023   CL 101 10/07/2023   CO2 39 (H) 10/07/2023   GLUCOSE 100 (H) 10/07/2023   BUN 21 10/07/2023   CREATININE 0.71 10/07/2023   BILITOT 0.4 10/07/2023   ALKPHOS 84 08/20/2022  AST 21 10/07/2023   ALT 17 10/07/2023   PROT 6.8 10/07/2023   ALBUMIN 2.7 (L) 08/23/2022   CALCIUM  9.3 10/07/2023   GFRAA 65  02/04/2020   QFTBGOLDPLUS NEGATIVE 10/07/2023    Speciality Comments: No specialty comments available.  Procedures:  No procedures performed Allergies: Erythromycin   Assessment / Plan:     Visit Diagnoses: No diagnosis found.  ***  Orders: No orders of the defined types were placed in this encounter.  No orders of the defined types were placed in this encounter.    Follow-Up Instructions: No follow-ups on file.   Arshawn Valdez M Chananya Canizalez, CMA  Note - This record has been created using Animal nutritionist.  Chart creation errors have been sought, but may not always  have been located. Such creation errors do not reflect on  the standard of medical care.

## 2024-01-12 ENCOUNTER — Other Ambulatory Visit (HOSPITAL_COMMUNITY): Payer: Self-pay

## 2024-01-12 ENCOUNTER — Ambulatory Visit: Admitting: Internal Medicine

## 2024-01-12 DIAGNOSIS — Z79899 Other long term (current) drug therapy: Secondary | ICD-10-CM

## 2024-01-12 DIAGNOSIS — M069 Rheumatoid arthritis, unspecified: Secondary | ICD-10-CM

## 2024-01-14 ENCOUNTER — Other Ambulatory Visit (HOSPITAL_COMMUNITY): Payer: Self-pay

## 2024-01-16 ENCOUNTER — Other Ambulatory Visit: Payer: Self-pay

## 2024-01-20 ENCOUNTER — Other Ambulatory Visit (HOSPITAL_COMMUNITY): Payer: Self-pay

## 2024-01-20 ENCOUNTER — Other Ambulatory Visit: Payer: Self-pay

## 2024-01-20 NOTE — Progress Notes (Signed)
 Specialty Pharmacy Refill Coordination Note  Spoke with Meghan Schwartz  Meghan Schwartz is a 73 y.o. female contacted today regarding refills of specialty medication(s) Etanercept  (Enbrel  SureClick)  Doses on hand: 1 for 12/2  Injection date: 02/03/24   Patient requested: Delivery   Delivery date: 01/30/24   Verified address: [address] [place] [address]  Medication will be filled on 01/29/24

## 2024-01-29 ENCOUNTER — Other Ambulatory Visit: Payer: Self-pay

## 2024-02-09 ENCOUNTER — Other Ambulatory Visit: Payer: Self-pay | Admitting: Family Medicine

## 2024-02-09 DIAGNOSIS — Z1231 Encounter for screening mammogram for malignant neoplasm of breast: Secondary | ICD-10-CM

## 2024-02-10 ENCOUNTER — Ambulatory Visit (HOSPITAL_BASED_OUTPATIENT_CLINIC_OR_DEPARTMENT_OTHER)

## 2024-02-17 ENCOUNTER — Other Ambulatory Visit: Payer: Self-pay

## 2024-02-17 ENCOUNTER — Other Ambulatory Visit: Payer: Self-pay | Admitting: Internal Medicine

## 2024-02-17 ENCOUNTER — Telehealth: Payer: Self-pay

## 2024-02-17 DIAGNOSIS — M069 Rheumatoid arthritis, unspecified: Secondary | ICD-10-CM

## 2024-02-17 DIAGNOSIS — Z79899 Other long term (current) drug therapy: Secondary | ICD-10-CM

## 2024-02-17 NOTE — Telephone Encounter (Signed)
 Last Fill: 10/07/2023  Labs: 10/07/2023 Sedimentation rate of 17 looks good. White blood count slightly low at 3.3. The metabolic panel shows a slight alkalosis with bicarb of 39 but is otherwise normal. No problem for continuing the Enbrel .   TB Gold: 10/07/2023 Neg    Next Visit: 04/12/2024  Last Visit: 10/07/2023  IK:Myzlfjunpi arthritis, involving unspecified site, unspecified whether rheumatoid factor present   Current Dose per office note 10/07/2023: Enbrel  50 mg [place] weekly   Left message to advise patient she is due to update labs.   Okay to refill Enbrel ?

## 2024-02-17 NOTE — Telephone Encounter (Signed)
-----   Message from Olam ORN sent at 02/16/2024  7:56 AM EST ----- Patient left VM on Admin Asst in Imaging Department over the weekend about refills. Please call her she is feeling worse. [phone].

## 2024-02-17 NOTE — Telephone Encounter (Signed)
 Left message for patient to call back

## 2024-02-18 ENCOUNTER — Emergency Department (HOSPITAL_COMMUNITY)

## 2024-02-18 ENCOUNTER — Emergency Department (HOSPITAL_COMMUNITY)
Admission: EM | Admit: 2024-02-18 | Discharge: 2024-02-18 | Disposition: A | Discharge status: Home / Self Care | Attending: Emergency Medicine | Admitting: Emergency Medicine

## 2024-02-18 ENCOUNTER — Encounter (HOSPITAL_COMMUNITY): Payer: Self-pay | Admitting: Emergency Medicine

## 2024-02-18 ENCOUNTER — Other Ambulatory Visit: Payer: Self-pay

## 2024-02-18 DIAGNOSIS — N390 Urinary tract infection, site not specified: Secondary | ICD-10-CM | POA: Insufficient documentation

## 2024-02-18 DIAGNOSIS — R42 Dizziness and giddiness: Secondary | ICD-10-CM | POA: Diagnosis present

## 2024-02-18 DIAGNOSIS — R531 Weakness: Secondary | ICD-10-CM

## 2024-02-18 DIAGNOSIS — Z79899 Other long term (current) drug therapy: Secondary | ICD-10-CM | POA: Insufficient documentation

## 2024-02-18 DIAGNOSIS — R4182 Altered mental status, unspecified: Secondary | ICD-10-CM | POA: Diagnosis present

## 2024-02-18 DIAGNOSIS — Z7902 Long term (current) use of antithrombotics/antiplatelets: Secondary | ICD-10-CM | POA: Insufficient documentation

## 2024-02-18 LAB — BASIC METABOLIC PANEL WITH GFR
Anion gap: 9 (ref 5–15)
BUN: 14 mg/dL (ref 8–23)
CO2: 27 mmol/L (ref 22–32)
Calcium: 8.6 mg/dL — ABNORMAL LOW (ref 8.9–10.3)
Chloride: 102 mmol/L (ref 98–111)
Creatinine, Ser: 0.79 mg/dL (ref 0.44–1.00)
GFR, Estimated: >60 mL/min
Glucose, Bld: 96 mg/dL (ref 70–99)
Potassium: 4.2 mmol/L (ref 3.5–5.1)
Sodium: 138 mmol/L (ref 135–145)

## 2024-02-18 LAB — URINE DRUG SCREEN
Amphetamines: NEGATIVE
Barbiturates: NEGATIVE
Benzodiazepines: POSITIVE — AB
Cocaine: NEGATIVE
Fentanyl: NEGATIVE
Methadone Scn, Ur: NEGATIVE
Opiates: NEGATIVE
Tetrahydrocannabinol: NEGATIVE

## 2024-02-18 LAB — URINALYSIS, ROUTINE W REFLEX MICROSCOPIC
Bilirubin Urine: NEGATIVE
Glucose, UA: NEGATIVE mg/dL
Hgb urine dipstick: NEGATIVE
Ketones, ur: NEGATIVE mg/dL
Nitrite: NEGATIVE
Protein, ur: 100 mg/dL — AB
Specific Gravity, Urine: 1.024 (ref 1.005–1.030)
pH: 5 (ref 5.0–8.0)

## 2024-02-18 LAB — CBC WITH DIFFERENTIAL/PLATELET
Abs Immature Granulocytes: 0.02 K/uL (ref 0.00–0.07)
Basophils Absolute: 0 K/uL (ref 0.0–0.1)
Basophils Relative: 0 %
Eosinophils Absolute: 0.1 K/uL (ref 0.0–0.5)
Eosinophils Relative: 2 %
HCT: 35.5 % — ABNORMAL LOW (ref 36.0–46.0)
Hemoglobin: 11.6 g/dL — ABNORMAL LOW (ref 12.0–15.0)
Immature Granulocytes: 0 %
Lymphocytes Relative: 16 %
Lymphs Abs: 0.9 K/uL (ref 0.7–4.0)
MCH: 28.4 pg (ref 26.0–34.0)
MCHC: 32.7 g/dL (ref 30.0–36.0)
MCV: 86.8 fL (ref 80.0–100.0)
Monocytes Absolute: 0.5 K/uL (ref 0.1–1.0)
Monocytes Relative: 9 %
Neutro Abs: 4 K/uL (ref 1.7–7.7)
Neutrophils Relative %: 73 %
Platelets: 151 K/uL (ref 150–400)
RBC: 4.09 MIL/uL (ref 3.87–5.11)
RDW: 12.5 % (ref 11.5–15.5)
WBC: 5.5 K/uL (ref 4.0–10.5)
nRBC: 0 % (ref 0.0–0.2)

## 2024-02-18 LAB — RESP PANEL BY RT-PCR (RSV, FLU A&B, COVID)  RVPGX2
Influenza A by PCR: NEGATIVE
Influenza B by PCR: NEGATIVE
Resp Syncytial Virus by PCR: NEGATIVE
SARS Coronavirus 2 by RT PCR: NEGATIVE

## 2024-02-18 LAB — AMMONIA: Ammonia: 23 umol/L (ref 9–35)

## 2024-02-18 LAB — MAGNESIUM: Magnesium: 1.9 mg/dL (ref 1.7–2.4)

## 2024-02-18 MED ORDER — FLUCONAZOLE 200 MG PO TABS: 200.0000 mg | ORAL_TABLET | Freq: Every day | ORAL | 0 refills | Status: DC | PRN

## 2024-02-18 MED ORDER — ENBREL SURECLICK 50 MG/ML ~~LOC~~ SOAJ
50.0000 mg | SUBCUTANEOUS | 2 refills | Status: AC
  Filled 2024-02-20 (×2): qty 4, 28d supply, fill #0

## 2024-02-18 MED ORDER — SODIUM CHLORIDE 0.9 % IV BOLUS
1000.0000 mL | Freq: Once | INTRAVENOUS | Status: AC
  Administered 2024-02-18: 1000 mL via INTRAVENOUS

## 2024-02-18 MED ORDER — CEPHALEXIN 500 MG PO CAPS: 500.0000 mg | ORAL_CAPSULE | Freq: Three times a day (TID) | ORAL | 0 refills | Status: AC

## 2024-02-18 MED ORDER — SODIUM CHLORIDE 0.9 % IV SOLN
1.0000 g | Freq: Once | INTRAVENOUS | Status: AC
  Administered 2024-02-18: 1 g via INTRAVENOUS
  Filled 2024-02-18: qty 10

## 2024-02-18 NOTE — ED Triage Notes (Signed)
 Patient BIB [facility] from home c/o confusion, dizziness, decrease appetite and fatigue that's been going on for 2 to 3 days. Patient told EMS she was here several months ago for the same symptoms. She was diagnose with low potassium. Patient is A& O x3. Vital signs: BP 108/60, Pulse 80, RR 18, Saturation, 90 RA on oxygen  liters Joes 97, Temp 99.8 and CBG 138. Patient reported she wear oxygen  2.5 Liters at night.

## 2024-02-18 NOTE — ED Provider Notes (Signed)
 " [place] [facility] Provider Note   CSN: [id number] Arrival date & time: 02/18/24  9075     Patient presents with: Altered Mental Status, Dizziness, and Fatigue   Meghan Schwartz is a 73 y.o. female presenting from home with feeling dizzy, weak, fatigue.  Patient where she has felt this way for 2 or 3 days.  She says she has felt this way in the past when she had electrolyte problems.  She reports poor appetite as well.  Dizziness.  Says that she fell asleep on her couch and slipped down and struck her head about 2 days ago.  Denies headache.  Says she lives with her husband.  Her family was concerned about how weak she was getting.  PDMP reviewed, last 30 days filled below:  02/10/2024 02/09/2024  1 Horizant  Er 600 Mg Tablet 180.00 90 Va Lav [id number] Nor (0780) 0/0  Medicare [place]  01/26/2024 01/26/2024  1 Clonazepam  1 Mg Tablet 45.00 30 Va Lav [id number] Nor ([id number]) 0/1 3.00 LME Medicare [place]  01/22/2024 01/22/2024  1 Zubsolv  5.7-1.4 Mg Tablet Sl 90.00 30 Va Lav [id number] Nor ([id number]) 0/1 17.10 mg Medicare [place]     {Add pertinent medical, surgical, social history, OB history to HPI:32947} HPI     Prior to Admission medications  Medication Sig Start Date End Date Taking? Authorizing Provider  acetaminophen  (TYLENOL ) 500 MG tablet Take 500 mg by mouth every 6 (six) hours as needed (For knee pain).    [provider]  Buprenorphine  HCl-Naloxone  HCl 5.7-1.4 MG SUBL Place 1 tablet under the tongue 3 (three) times daily. 08/12/23     Carboxymethylcellulose Sodium (THERATEARS) 0.25 % SOLN Place 2 drops into the left eye daily.    [provider]  cetirizine (ZYRTEC) 10 MG tablet Take 10 mg by mouth daily.    [provider]  Cholecalciferol (VITAMIN D) 50 MCG (2000 UT) tablet Take 2,000 Units by mouth daily.    [provider]  citalopram  (CELEXA ) 10 MG tablet TK 1 T PO TID    [provider]  clonazePAM  (KLONOPIN ) 1 MG  tablet Take 1 tablet (1 mg total) by mouth at bedtime. Patient taking differently: Take 0.5-1 mg by mouth See admin instructions. Take 1 mg every night and an additional 0.5 mg at lunch as needed for anxiety 08/23/22   Rosario Eland I, MD  clopidogrel  (PLAVIX ) 75 MG tablet TAKE 1 TABLET BY MOUTH EVERY DAY 05/19/23   Okey Vina GAILS, MD  cyclobenzaprine  (FLEXERIL ) 10 MG tablet Take 1 tablet (10 mg total) by mouth 3 (three) times daily as needed for spasms 08/05/23     docusate sodium  ([employer] STOOL SOFTENER) 100 MG capsule Take 100 mg by mouth at bedtime.    [provider]  escitalopram  (LEXAPRO ) 10 MG tablet Take 15 mg by mouth every morning. 08/31/22   [provider]  estradiol  (VIVELLE -DOT) 0.075 MG/24HR Place 1 patch onto the skin 2 (two) times a week. Tues. & Sat.    [provider]  etanercept  (ENBREL  SURECLICK) 50 MG/ML injection Inject 50 mg into the skin once a week. 10/07/23   Jeannetta Lonni ORN, MD  HORIZANT  600 MG TBCR Take 1 tablet by mouth every evening. GABAPENTIN     [provider]  HORIZANT  600 MG TBCR Take 1 tablet (600 mg total) by mouth 2 (two) times daily. 09/08/23     methocarbamol  (ROBAXIN ) 500 MG tablet Take 1 tablet (500 mg total) by  mouth every 6 (six) hours as needed for muscle spasms. 04/15/23   Edmisten, Roxie CROME, PA  metoprolol  tartrate (LOPRESSOR ) 25 MG tablet TAKE 1 TABLET BY MOUTH EVERY DAY 06/30/23   Okey Vina GAILS, MD  Multiple Vitamins-Minerals (MULTIVITAMIN WITH MINERALS) tablet Take 1 tablet by mouth daily. Woman 50+    [provider]  nitroGLYCERIN  (NITROSTAT ) 0.4 MG SL tablet Place 1 tablet (0.4 mg total) under the tongue every 5 (five) minutes as needed for chest pain. 01/28/18   Rai, Ripudeep MARLA, MD  ondansetron  (ZOFRAN ) 4 MG tablet Take 1 tablet (4 mg total) by mouth every 6 (six) hours as needed for nausea. 04/15/23   Edmisten, Kristie L, PA  ondansetron  (ZOFRAN -ODT) 4 MG disintegrating tablet Take by mouth daily. 10/26/22    [provider]  potassium chloride  SA (KLOR-CON  M) 20 MEQ tablet Take 1 tablet (20 mEq total) by mouth daily. 04/02/23   Okey Vina GAILS, MD  Probiotic Product (ALIGN) 4 MG CAPS Take 4 mg by mouth daily.    [provider]  rosuvastatin  (CRESTOR ) 40 MG tablet TAKE 1 TABLET BY MOUTH EVERY DAY 03/11/23   Okey Vina GAILS, MD  torsemide  (DEMADEX ) 20 MG tablet TAKE 1 TABLET BY MOUTH TWICE A DAY 04/17/23   Okey Vina GAILS, MD  triamcinolone  cream (KENALOG ) 0.1 % Apply 1 Application topically daily as needed (Itching skin). 10/01/22   [provider]  VITAMIN D, ERGOCALCIFEROL, PO Vitamin D (Ergocalciferol) 02/25/1899   [provider]  ZUBSOLV  5.7-1.4 MG SUBL Place 1 tablet under the tongue 3 (three) times daily. 09/04/22   [provider]    Allergies: Erythromycin    Review of Systems  Updated Vital Signs BP (!) 104/57   Pulse 78   Temp 99.6 F (37.6 C)   Resp 14   SpO2 95%   Physical Exam Constitutional:      General: She is not in acute distress. HENT:     Head: Normocephalic and atraumatic.  Eyes:     Conjunctiva/sclera: Conjunctivae normal.     Pupils: Pupils are equal, round, and reactive to light.  Cardiovascular:     Rate and Rhythm: Normal rate and regular rhythm.  Pulmonary:     Effort: Pulmonary effort is normal. No respiratory distress.  Abdominal:     General: There is no distension.     Tenderness: There is no abdominal tenderness.  Skin:    General: Skin is warm and dry.  Neurological:     General: No focal deficit present.     Mental Status: She is alert. Mental status is at baseline.  Psychiatric:        Mood and Affect: Mood normal.        Behavior: Behavior normal.     (all labs ordered are listed, but only abnormal results are displayed) Labs Reviewed  RESP PANEL BY RT-PCR (RSV, FLU A&B, COVID)  RVPGX2  BASIC METABOLIC PANEL WITH GFR  CBC WITH DIFFERENTIAL/PLATELET  AMMONIA  URINALYSIS, ROUTINE W REFLEX MICROSCOPIC   URINE DRUG SCREEN  MAGNESIUM     EKG: EKG Interpretation Date/Time:  Wednesday February 18 2024 09:25:44 EST Ventricular Rate:  78 PR Interval:  167 QRS Duration:  81 QT Interval:  383 QTC Calculation: 437 R Axis:   47  Text Interpretation: Sinus rhythm Borderline low voltage, extremity leads Confirmed by Cottie Cough [phone]) on 02/18/2024 9:36:31 AM  Radiology: No results found.  {Document cardiac monitor, telemetry assessment procedure when appropriate:32947} Procedures  Medications Ordered in the ED  sodium chloride  0.9 % bolus 1,000 mL (has no administration in time range)      {Click here for ABCD2, HEART and other calculators REFRESH Note before signing:1}                              Medical Decision Making Amount and/or Complexity of Data Reviewed Labs: ordered. Radiology: ordered.   This patient presents to the Emergency Department with complaint of altered mental status.  This involves an extensive number of treatment options, and is a complaint that carries with it a high risk of complications and morbidity.  The differential diagnosis includes hypoglycemia vs metabolic encephalopathy vs infection (including cystitis) vs ICH vs stroke vs polypharmacy vs other  I ordered, reviewed, and interpreted labs, including *** I ordered medication *** for *** I ordered imaging studies which included ***  I independently visualized and interpreted imaging which showed *** and the monitor tracing which showed *** Additional history was obtained from *** Previous records obtained and reviewed showing *** I personally reviewed the patients ECG which showed sinus rhythm with no acute ischemic findings***  I consulted *** and discussed lab and imaging findings  After the interventions stated above, I reevaluated the patient and found ***    {Document critical care time when appropriate  Document review of labs and clinical decision tools ie [id number], etc   Document your independent review of radiology images and any outside records  Document your discussion with family members, caretakers and with consultants  Document social determinants of health affecting pt's care  Document your decision making why or why not admission, treatments were needed:32947:::1}   Final diagnoses:  None    ED Discharge Orders     None        "

## 2024-02-19 ENCOUNTER — Encounter (HOSPITAL_BASED_OUTPATIENT_CLINIC_OR_DEPARTMENT_OTHER): Payer: Self-pay

## 2024-02-19 ENCOUNTER — Emergency Department (HOSPITAL_BASED_OUTPATIENT_CLINIC_OR_DEPARTMENT_OTHER)
Admission: EM | Admit: 2024-02-19 | Discharge: 2024-02-19 | Disposition: A | Discharge status: Home / Self Care | Attending: Emergency Medicine | Admitting: Emergency Medicine

## 2024-02-19 ENCOUNTER — Other Ambulatory Visit: Payer: Self-pay

## 2024-02-19 ENCOUNTER — Emergency Department (HOSPITAL_BASED_OUTPATIENT_CLINIC_OR_DEPARTMENT_OTHER)

## 2024-02-19 DIAGNOSIS — N281 Cyst of kidney, acquired: Secondary | ICD-10-CM | POA: Diagnosis not present

## 2024-02-19 DIAGNOSIS — R531 Weakness: Secondary | ICD-10-CM | POA: Insufficient documentation

## 2024-02-19 DIAGNOSIS — R42 Dizziness and giddiness: Secondary | ICD-10-CM | POA: Insufficient documentation

## 2024-02-19 DIAGNOSIS — K529 Noninfective gastroenteritis and colitis, unspecified: Secondary | ICD-10-CM

## 2024-02-19 DIAGNOSIS — I728 Aneurysm of other specified arteries: Secondary | ICD-10-CM | POA: Insufficient documentation

## 2024-02-19 DIAGNOSIS — A0472 Enterocolitis due to Clostridium difficile, not specified as recurrent: Secondary | ICD-10-CM | POA: Diagnosis not present

## 2024-02-19 DIAGNOSIS — R609 Edema, unspecified: Secondary | ICD-10-CM | POA: Diagnosis not present

## 2024-02-19 DIAGNOSIS — K8020 Calculus of gallbladder without cholecystitis without obstruction: Secondary | ICD-10-CM | POA: Insufficient documentation

## 2024-02-19 DIAGNOSIS — K429 Umbilical hernia without obstruction or gangrene: Secondary | ICD-10-CM | POA: Insufficient documentation

## 2024-02-19 DIAGNOSIS — Z7902 Long term (current) use of antithrombotics/antiplatelets: Secondary | ICD-10-CM | POA: Insufficient documentation

## 2024-02-19 DIAGNOSIS — R109 Unspecified abdominal pain: Secondary | ICD-10-CM | POA: Insufficient documentation

## 2024-02-19 DIAGNOSIS — J9811 Atelectasis: Secondary | ICD-10-CM | POA: Diagnosis not present

## 2024-02-19 DIAGNOSIS — I700 Atherosclerosis of aorta: Secondary | ICD-10-CM | POA: Insufficient documentation

## 2024-02-19 DIAGNOSIS — K449 Diaphragmatic hernia without obstruction or gangrene: Secondary | ICD-10-CM | POA: Diagnosis not present

## 2024-02-19 LAB — CBC WITH DIFFERENTIAL/PLATELET
Abs Immature Granulocytes: 0.02 K/uL (ref 0.00–0.07)
Basophils Absolute: 0 K/uL (ref 0.0–0.1)
Basophils Relative: 0 %
Eosinophils Absolute: 0.1 K/uL (ref 0.0–0.5)
Eosinophils Relative: 1 %
HCT: 37.4 % (ref 36.0–46.0)
Hemoglobin: 12.3 g/dL (ref 12.0–15.0)
Immature Granulocytes: 0 %
Lymphocytes Relative: 13 %
Lymphs Abs: 0.7 K/uL (ref 0.7–4.0)
MCH: 28.1 pg (ref 26.0–34.0)
MCHC: 32.9 g/dL (ref 30.0–36.0)
MCV: 85.4 fL (ref 80.0–100.0)
Monocytes Absolute: 0.5 K/uL (ref 0.1–1.0)
Monocytes Relative: 9 %
Neutro Abs: 4.3 K/uL (ref 1.7–7.7)
Neutrophils Relative %: 77 %
Platelets: 166 K/uL (ref 150–400)
RBC: 4.38 MIL/uL (ref 3.87–5.11)
RDW: 12.4 % (ref 11.5–15.5)
WBC: 5.7 K/uL (ref 4.0–10.5)
nRBC: 0 % (ref 0.0–0.2)

## 2024-02-19 LAB — COMPREHENSIVE METABOLIC PANEL WITH GFR
ALT: 12 U/L (ref 0–44)
AST: 19 U/L (ref 15–41)
Albumin: 4 g/dL (ref 3.5–5.0)
Alkaline Phosphatase: 106 U/L (ref 38–126)
Anion gap: 9 (ref 5–15)
BUN: 11 mg/dL (ref 8–23)
CO2: 29 mmol/L (ref 22–32)
Calcium: 9.6 mg/dL (ref 8.9–10.3)
Chloride: 100 mmol/L (ref 98–111)
Creatinine, Ser: 0.75 mg/dL (ref 0.44–1.00)
GFR, Estimated: >60 mL/min
Glucose, Bld: 99 mg/dL (ref 70–99)
Potassium: 3.5 mmol/L (ref 3.5–5.1)
Sodium: 138 mmol/L (ref 135–145)
Total Bilirubin: 0.8 mg/dL (ref 0.0–1.2)
Total Protein: 6.8 g/dL (ref 6.5–8.1)

## 2024-02-19 LAB — URINALYSIS, ROUTINE W REFLEX MICROSCOPIC
Bacteria, UA: NONE SEEN
Bilirubin Urine: NEGATIVE
Glucose, UA: NEGATIVE mg/dL
Ketones, ur: NEGATIVE mg/dL
Leukocytes,Ua: NEGATIVE
Nitrite: NEGATIVE
Protein, ur: 30 mg/dL — AB
Specific Gravity, Urine: 1.041 — ABNORMAL HIGH (ref 1.005–1.030)
pH: 5.5 (ref 5.0–8.0)

## 2024-02-19 LAB — TROPONIN T, HIGH SENSITIVITY
Troponin T High Sensitivity: 26 ng/L — ABNORMAL HIGH (ref 0–19)
Troponin T High Sensitivity: 26 ng/L — ABNORMAL HIGH (ref 0–19)

## 2024-02-19 LAB — URINE CULTURE

## 2024-02-19 LAB — MAGNESIUM: Magnesium: 1.9 mg/dL (ref 1.7–2.4)

## 2024-02-19 LAB — RESP PANEL BY RT-PCR (RSV, FLU A&B, COVID)  RVPGX2
Influenza A by PCR: NEGATIVE
Influenza B by PCR: NEGATIVE
Resp Syncytial Virus by PCR: NEGATIVE
SARS Coronavirus 2 by RT PCR: NEGATIVE

## 2024-02-19 LAB — PHOSPHORUS: Phosphorus: 3.3 mg/dL (ref 2.5–4.6)

## 2024-02-19 LAB — LIPASE, BLOOD: Lipase: 14 U/L (ref 11–51)

## 2024-02-19 MED ORDER — AMOXICILLIN-POT CLAVULANATE 875-125 MG PO TABS: 1.0000 | ORAL_TABLET | Freq: Two times a day (BID) | ORAL | 0 refills | Status: DC

## 2024-02-19 MED ORDER — IOHEXOL 300 MG/ML  SOLN
100.0000 mL | Freq: Once | INTRAMUSCULAR | Status: AC | PRN
  Administered 2024-02-19: 100 mL via INTRAVENOUS

## 2024-02-19 MED ORDER — AMOXICILLIN-POT CLAVULANATE 875-125 MG PO TABS
1.0000 | ORAL_TABLET | Freq: Once | ORAL | Status: AC
  Administered 2024-02-19: 1 via ORAL
  Filled 2024-02-19: qty 1

## 2024-02-19 NOTE — ED Notes (Addendum)
 Reviewed discharge instructions, medications, and home care with pt and spouse. Both verbalized understanding and had no further questions. Pt exited ED without complications.

## 2024-02-19 NOTE — ED Triage Notes (Signed)
 Pt c/o continued UTI symptoms. Seen & dx w UTI yesterday at [facility], advises she's taken 1 abx pill.

## 2024-02-19 NOTE — ED Provider Notes (Signed)
 " [place] [facility] AT [facility] Provider Note   CSN: [id number] Arrival date & time: 02/19/24  1527     Patient presents with: No chief complaint on file.   Meghan Schwartz is a 73 y.o. female.   HPI Patient was seen in the emergency department yesterday with general weakness and dizziness.  She was diagnosed with possible UTI and was prescribed an antibiotic.  She reports her symptoms are worse today and she is returning.  She reports she now also has developed some diarrhea and abdominal distention and pain.  She reports she has felt weak and dizzy and had increased difficulty doing normal things in her house.  No vomiting.    Prior to Admission medications  Medication Sig Start Date End Date Taking? Authorizing Provider  amoxicillin -clavulanate (AUGMENTIN ) 875-125 MG tablet Take 1 tablet by mouth every 12 (twelve) hours. 02/19/24  Yes Armenta Canning, MD  acetaminophen  (TYLENOL ) 500 MG tablet Take 500 mg by mouth every 6 (six) hours as needed (For knee pain).    [provider]  Buprenorphine  HCl-Naloxone  HCl 5.7-1.4 MG SUBL Place 1 tablet under the tongue 3 (three) times daily. 08/12/23     Carboxymethylcellulose Sodium (THERATEARS) 0.25 % SOLN Place 2 drops into the left eye daily.    [provider]  cephALEXin  (KEFLEX ) 500 MG capsule Take 1 capsule (500 mg total) by mouth 3 (three) times daily for 6 days. 02/19/24 02/25/24  Cottie Donnice PARAS, MD  cetirizine (ZYRTEC) 10 MG tablet Take 10 mg by mouth daily.    [provider]  Cholecalciferol (VITAMIN D) 50 MCG (2000 UT) tablet Take 2,000 Units by mouth daily.    [provider]  citalopram  (CELEXA ) 10 MG tablet TK 1 T PO TID    [provider]  clonazePAM  (KLONOPIN ) 1 MG tablet Take 1 tablet (1 mg total) by mouth at bedtime. Patient taking differently: Take 0.5-1 mg by mouth See admin instructions. Take 1 mg every night and an additional 0.5 mg at lunch as needed for  anxiety 08/23/22   Rosario Eland I, MD  clopidogrel  (PLAVIX ) 75 MG tablet TAKE 1 TABLET BY MOUTH EVERY DAY 05/19/23   Okey Vina GAILS, MD  cyclobenzaprine  (FLEXERIL ) 10 MG tablet Take 1 tablet (10 mg total) by mouth 3 (three) times daily as needed for spasms 08/05/23     docusate sodium  ([employer] STOOL SOFTENER) 100 MG capsule Take 100 mg by mouth at bedtime.    [provider]  escitalopram  (LEXAPRO ) 10 MG tablet Take 15 mg by mouth every morning. 08/31/22   [provider]  estradiol  (VIVELLE -DOT) 0.075 MG/24HR Place 1 patch onto the skin 2 (two) times a week. Tues. & Sat.    [provider]  etanercept  (ENBREL  SURECLICK) 50 MG/ML injection Inject 50 mg into the skin once a week. 02/18/24   Rice, Lonni ORN, MD  fluconazole  (DIFLUCAN ) 200 MG tablet Take 1 tablet (200 mg total) by mouth daily as needed for up to 1 dose. 02/18/24   Cottie Donnice PARAS, MD  HORIZANT  600 MG TBCR Take 1 tablet by mouth every evening. GABAPENTIN     [provider]  HORIZANT  600 MG TBCR Take 1 tablet (600 mg total) by mouth 2 (two) times daily. 09/08/23     methocarbamol  (ROBAXIN ) 500 MG tablet Take 1 tablet (500 mg total) by mouth every 6 (six) hours as needed for muscle spasms. 04/15/23   Edmisten, Kristie L, PA  metoprolol  tartrate (LOPRESSOR ) 25  MG tablet TAKE 1 TABLET BY MOUTH EVERY DAY 06/30/23   Okey Vina GAILS, MD  Multiple Vitamins-Minerals (MULTIVITAMIN WITH MINERALS) tablet Take 1 tablet by mouth daily. Woman 50+    [provider]  nitroGLYCERIN  (NITROSTAT ) 0.4 MG SL tablet Place 1 tablet (0.4 mg total) under the tongue every 5 (five) minutes as needed for chest pain. 01/28/18   Rai, Ripudeep MARLA, MD  ondansetron  (ZOFRAN ) 4 MG tablet Take 1 tablet (4 mg total) by mouth every 6 (six) hours as needed for nausea. 04/15/23   Edmisten, Kristie L, PA  ondansetron  (ZOFRAN -ODT) 4 MG disintegrating tablet Take by mouth daily. 10/26/22   [provider]  potassium chloride  SA  (KLOR-CON  M) 20 MEQ tablet Take 1 tablet (20 mEq total) by mouth daily. 04/02/23   Okey Vina GAILS, MD  Probiotic Product (ALIGN) 4 MG CAPS Take 4 mg by mouth daily.    [provider]  rosuvastatin  (CRESTOR ) 40 MG tablet TAKE 1 TABLET BY MOUTH EVERY DAY 03/11/23   Okey Vina GAILS, MD  torsemide  (DEMADEX ) 20 MG tablet TAKE 1 TABLET BY MOUTH TWICE A DAY 04/17/23   Okey Vina GAILS, MD  triamcinolone  cream (KENALOG ) 0.1 % Apply 1 Application topically daily as needed (Itching skin). 10/01/22   [provider]  VITAMIN D, ERGOCALCIFEROL, PO Vitamin D (Ergocalciferol) 02/25/1899   [provider]  ZUBSOLV  5.7-1.4 MG SUBL Place 1 tablet under the tongue 3 (three) times daily. 09/04/22   [provider]    Allergies: Erythromycin    Review of Systems  Updated Vital Signs BP 104/63   Pulse 79   Temp 97.8 F (36.6 C)   Resp 16   SpO2 94%   Physical Exam Constitutional:      Comments: Patient is alert.  No respiratory distress.  Nontoxic.  HENT:     Mouth/Throat:     Pharynx: Oropharynx is clear.  Cardiovascular:     Rate and Rhythm: Normal rate and regular rhythm.  Pulmonary:     Effort: Pulmonary effort is normal.     Breath sounds: Normal breath sounds.  Abdominal:     Comments: Abdomen is soft moderately tender to palpation diffusely.  Musculoskeletal:        General: No swelling or tenderness. Normal range of motion.     Right lower leg: No edema.     Left lower leg: No edema.  Skin:    General: Skin is warm and dry.  Neurological:     General: No focal deficit present.     Mental Status: She is oriented to person, place, and time.     Motor: No weakness.     Coordination: Coordination normal.  Psychiatric:        Mood and Affect: Mood normal.     (all labs ordered are listed, but only abnormal results are displayed) Labs Reviewed  URINALYSIS, ROUTINE W REFLEX MICROSCOPIC - Abnormal; Notable for the following components:      Result Value    Specific Gravity, Urine 1.041 (*)    Hgb urine dipstick SMALL (*)    Protein, ur 30 (*)    All other components within normal limits  TROPONIN T, HIGH SENSITIVITY - Abnormal; Notable for the following components:   Troponin T High Sensitivity 26 (*)    All other components within normal limits  TROPONIN T, HIGH SENSITIVITY - Abnormal; Notable for the following components:   Troponin T High Sensitivity 26 (*)    All other  components within normal limits  RESP PANEL BY RT-PCR (RSV, FLU A&B, COVID)  RVPGX2  GASTROINTESTINAL PANEL BY PCR, STOOL (REPLACES STOOL CULTURE)  C DIFFICILE QUICK SCREEN W PCR REFLEX    COMPREHENSIVE METABOLIC PANEL WITH GFR  LIPASE, BLOOD  CBC WITH DIFFERENTIAL/PLATELET  MAGNESIUM   PHOSPHORUS    EKG: None  Radiology: CT ABDOMEN PELVIS W CONTRAST Result Date: 02/19/2024 CLINICAL DATA:  Acute abdominal pain. EXAM: CT ABDOMEN AND PELVIS WITH CONTRAST TECHNIQUE: Multidetector CT imaging of the abdomen and pelvis was performed using the standard protocol following bolus administration of intravenous contrast. RADIATION DOSE REDUCTION: This exam was performed according to the departmental dose-optimization program which includes automated exposure control, adjustment of the mA and/or kV according to patient size and/or use of iterative reconstruction technique. CONTRAST:  OMNIPAQUE  IOHEXOL  300 MG/ML  SOLN COMPARISON:  08/20/2022 FINDINGS: Lower chest: Subsegmental area of atelectasis in the anterior left lower lobe. No pleural fluid. Hepatobiliary: No focal liver abnormality. Multiple gallstones within physiologically distended gallbladder. There are stones in the gallbladder neck. No gallbladder wall thickening or pericholecystic stranding on CT. Common bile duct is upper normal proximally at 7 mm, 6 mm distally. No visible choledocholithiasis. Pancreas: Parenchymal atrophy. No ductal dilatation or inflammation. Spleen: Calcified granuloma. No splenomegaly. Calcified  splenic artery aneurysm measures up to 12 mm, unchanged. Adrenals/Urinary Tract: No adrenal nodule. No hydronephrosis or perinephric edema. Homogeneous renal enhancement with symmetric excretion on delayed phase imaging. Right renal cyst. No further follow-up imaging is recommended. Urinary bladder is partially distended. Mild bladder wall thickening. Stomach/Bowel: Long segment colonic wall thickening extending from the proximal transverse through the sigmoid with pericolonic edema and enhancement. No bowel pneumatosis or perforation. The appendix is normal. A few fluid-filled loops of small bowel are likely reactive. No small bowel obstruction. Small hiatal hernia. Vascular/Lymphatic: Aortic atherosclerosis without aneurysm. Patent portal vein. 12 mm calcified splenic artery aneurysm. No abdominopelvic adenopathy. Reproductive: Hysterectomy.  No adnexal mass. Other: No free air, free fluid, or intra-abdominal fluid collection. Small fat containing umbilical hernia. Musculoskeletal: Degenerative disc disease and facet hypertrophy in the lumbar spine. There are no acute or suspicious osseous abnormalities. IMPRESSION: 1. Colitis extending from the proximal transverse through the sigmoid. This may be infectious or inflammatory. 2. Cholelithiasis without CT findings of acute cholecystitis. 3. Mild bladder wall thickening, potentially reactive. Aortic Atherosclerosis (ICD10-I70.0). Electronically Signed   By: Andrea Gasman M.D.   On: 02/19/2024 19:07   CT Head Wo Contrast Result Date: 02/18/2024 EXAM: CT HEAD WITHOUT CONTRAST 02/18/2024 10:32:51 AM TECHNIQUE: CT of the head was performed without the administration of intravenous contrast. Automated exposure control, iterative reconstruction, and/or weight based adjustment of the mA/kV was utilized to reduce the radiation dose to as low as reasonably achievable. COMPARISON: CT head 12/25/2023 and MRI brain 07/15/2018. CLINICAL HISTORY: Mental status change,  unknown cause. FINDINGS: BRAIN AND VENTRICLES: No acute intracranial hemorrhage. There is overall similar mild-to-moderate scattered white matter hypodensities which are nonspecific but most commonly represent chronic microvascular ischemic changes. No evidence of acute territorial infarct. No hydrocephalus. No extra-axial collection. No mass effect or midline shift. ORBITS: No acute abnormality. SINUSES: There is partial opacification of the frontal and left ethmoid sinuses most notably. SOFT TISSUES AND SKULL: No acute soft tissue abnormality. IMPRESSION: 1. No acute intracranial hemorrhage, mass effect, or evidence of acute territorial infarction. 2. No substantial change since 12/25/2023. Electronically signed by: Prentice Spade MD 02/18/2024 10:52 AM EST RP Workstation: [id number]   DG Chest 2 View  Result Date: 02/18/2024 CLINICAL DATA:  pneumonia evaluation EXAM: CHEST - 2 VIEW COMPARISON:  March 25, 2019, August 20, 2022 FINDINGS: The cardiomediastinal silhouette is unchanged in contour. No pleural effusion. No pneumothorax. No acute pleuroparenchymal abnormality. Visualized abdomen is unremarkable. Multilevel degenerative changes of the thoracic spine. IMPRESSION: No acute cardiopulmonary abnormality. Electronically Signed   By: Corean Salter M.D.   On: 02/18/2024 10:26     Procedures   Medications Ordered in the ED  amoxicillin -clavulanate (AUGMENTIN ) 875-125 MG per tablet 1 tablet (has no administration in time range)  iohexol  (OMNIPAQUE ) 300 MG/ML solution 100 mL (100 mLs Intravenous Contrast Given 02/19/24 1842)                                    Medical Decision Making Amount and/or Complexity of Data Reviewed Labs: ordered. Radiology: ordered.  Risk Prescription drug management.   Patient presents as outlined.  She is having some continued symptoms after being evaluated yesterday.  She reports now she also has more prominent abdominal pain and feeling like her abdomen is  firm and distended and has developed some diarrhea.  Patient is nontoxic and alert.  Will proceed with diagnostic evaluation with repeat labs and add CT imaging of the abdomen.  Urinalysis negative for infection.  Troponin is flat at 26.  Respiratory panel negative.  Phosphorus 3.3.  Mag 1.9.  Lipase 14.  Metabolic panel with LFTs is normal.  CBC with differential is normal.  CT imaging shows colitis from proximal transverse colon to sigmoid with some reactive bladder inflammation.  Given extent of colitis and symptoms of fairly acute onset, will opt to treat with antibiotics.  Will start the patient on Augmentin .  I discussed with the patient and her husband at bedside that C. difficile is still pending and if this was positive it would significantly change the treatment plan.  I have included this in discharge instructions.  They voiced understanding.  At this time I have lower suspicion for C. difficile given the acute onset and no immediate risk factors.  I will opt to start her on the Augmentin  but have educated them on the implication of a positive C. difficile result.  They report they are familiar with watching their MyChart for results.     Final diagnoses:  Colitis  General weakness    ED Discharge Orders          Ordered    amoxicillin -clavulanate (AUGMENTIN ) 875-125 MG tablet  Every 12 hours        02/19/24 2109               Armenta Canning, MD 02/19/24 2114  "

## 2024-02-19 NOTE — ED Notes (Signed)
 Patient transported to CT

## 2024-02-19 NOTE — ED Notes (Signed)
 ED Provider at bedside.

## 2024-02-19 NOTE — Discharge Instructions (Addendum)
 1.  Your CT scan shows evidence of colitis.  This is inflammation of the bowel.  This can be from different causes but might be from an infection.  You have been given an antibiotic called Augmentin  to take.  You have been given your first dose in the emergency department and you are to fill the prescription and start taking it tomorrow. 2.  You had a test done on your stool in the emergency department that has not yet resulted.  This is a test for an infectious kind of diarrhea called C. difficile.  You need to watch your MyChart to see if this results positive.  If this test is positive, that changes the antibiotic that you must take.  You must call your doctor immediately and discuss the treatment that is needed. 3.  Try to stay well-hydrated.  Take extra strength Tylenol  for pain as needed.  Return to emergency department immediately if you are getting fever, worsening pain, worsening general weakness or other concerning changes.

## 2024-02-20 ENCOUNTER — Other Ambulatory Visit (HOSPITAL_COMMUNITY): Payer: Self-pay

## 2024-02-20 ENCOUNTER — Telehealth (HOSPITAL_BASED_OUTPATIENT_CLINIC_OR_DEPARTMENT_OTHER): Payer: Self-pay | Admitting: Emergency Medicine

## 2024-02-20 ENCOUNTER — Other Ambulatory Visit: Payer: Self-pay

## 2024-02-20 LAB — GASTROINTESTINAL PANEL BY PCR, STOOL (REPLACES STOOL CULTURE)

## 2024-02-20 LAB — C DIFFICILE QUICK SCREEN W PCR REFLEX
C Diff antigen: POSITIVE — AB
C Diff interpretation: DETECTED
C Diff toxin: POSITIVE — AB

## 2024-02-20 MED ORDER — FIDAXOMICIN 200 MG PO TABS: 200.0000 mg | ORAL_TABLET | Freq: Two times a day (BID) | ORAL | 0 refills | Status: DC

## 2024-02-20 NOTE — ED Notes (Signed)
 Note written at 0016 on 02/20/2024  Called received from lab, positive result for cdiff toxin and antigen  EDP M. Pfeiffer, MD made aware of results.

## 2024-02-20 NOTE — Telephone Encounter (Signed)
 Patient C. difficile has returned positive after discharge.  I did review this possibility with the patient and am changing the prescription to Fidaxomicin  for C. difficile.

## 2024-02-23 ENCOUNTER — Other Ambulatory Visit (HOSPITAL_COMMUNITY): Payer: Self-pay

## 2024-02-23 ENCOUNTER — Other Ambulatory Visit: Payer: Self-pay

## 2024-03-02 ENCOUNTER — Ambulatory Visit

## 2024-03-02 NOTE — Progress Notes (Deleted)
 " OUTPATIENT PHYSICAL THERAPY LOWER EXTREMITY EVALUATION   Patient Name: Meghan Schwartz MRN: [id number] DOB:December 25, 1950, 74 y.o., female Today's Date: 03/02/2024  END OF SESSION:   Past Medical History:  Diagnosis Date   Allergic rhinitis    Anxiety    Benzodiazepine dependence (HCC)    Bruxism    CAD (coronary artery disease)    S/p NSTEMI 01/2018 >> LHC: oLAD 20, pLAD 20; oD1 20, oLCx 40, pRCA 99 >> PCI: DES to prox RCA // Echo 01/2018: EF 50-55; prob inf-lat and inf HK   Chronic diastolic heart failure    Echocardiogram 07/2018: EF 55-60, grade 1 diastolic dysfunction, normal wall motion, normal GLS (-22.3), PASP 28   Chronic foot pain    bunions, torn ligaments-on chronic pain medication   CTS (carpal tunnel syndrome) 06/08/2014   Depression    High cholesterol    Hypertension    Low blood potassium    MDD (major depressive disorder) 10/02/2017   Myocardial infarction (HCC) 02/01/2018   Pneumonia 1986   left lung   RA (rheumatoid arthritis) (HCC)    Past Surgical History:  Procedure Laterality Date   BTL  2000   CARPAL TUNNEL RELEASE Left    CESAREAN SECTION  1978. 1983   CORONARY STENT INTERVENTION N/A 01/27/2018   Procedure: CORONARY STENT INTERVENTION;  Surgeon: Verlin Lonni BIRCH, MD;  Location: [facility];  Service: Cardiovascular;  Laterality: N/A;   eye implants     FOOT SURGERY Left 2008   front teeth removed after injury     KNEE ARTHROSCOPY WITH LATERAL MENISECTOMY Left 02/02/2019   Procedure: LEFT KNEE ARTHROSCOPY, MEDIAL AND LATERAL MENISECTOMY, EXCISION OF LEFT LATERAL MENISCAL CYST;  Surgeon: Anderson Maude ORN, MD;  Location: [facility];  Service: Orthopedics;  Laterality: Left;   LEFT HEART CATH AND CORONARY ANGIOGRAPHY N/A 01/27/2018   Procedure: LEFT HEART CATH AND CORONARY ANGIOGRAPHY;  Surgeon: Verlin Lonni BIRCH, MD;  Location: [facility];  Service: Cardiovascular;  Laterality: N/A;   pre cancerous lesion removed from forehead      TOTAL ABDOMINAL HYSTERECTOMY W/ BILATERAL SALPINGOOPHORECTOMY  2002   Dr. Horacio   TOTAL KNEE ARTHROPLASTY Left 04/14/2023   Procedure: TOTAL KNEE ARTHROPLASTY;  Surgeon: Melodi Lerner, MD;  Location: [facility];  Service: Orthopedics;  Laterality: Left;   VAGINAL HYSTERECTOMY  2001   Patient Active Problem List   Diagnosis Date Noted   Traumatic injury of head with loss of vision 10/16/2023   Primary osteoarthritis of left knee 04/14/2023   Hypocalcemia 08/21/2022   Falls, initial encounter 08/21/2022   Prolonged QT interval 08/21/2022   Acute metabolic encephalopathy 08/21/2022   Essential hypertension 08/21/2022   AKI (acute kidney injury) 08/21/2022   Chronic pain 08/21/2022   Chronic diastolic CHF (congestive heart failure) (HCC) 08/21/2022   Effusion, left knee 06/12/2022   Leg edema, left 12/25/2021   Claw toe, left 08/07/2021   Dog bite 03/05/2021   High risk medication use 02/05/2021   Dry mouth 02/05/2021   Pain in left foot 01/31/2021   Bunion of great toe of right foot 06/15/2020   Hypoxia 03/05/2019   Derangement of posterior horn of medial meniscus 02/02/2019   Derangement of posterior horn of lateral meniscus 02/02/2019   Meniscal cyst, unspecified laterality 12/01/2018   Torn medial meniscus 12/01/2018   OA (osteoarthritis) of knee 12/01/2018   Coronary artery disease involving native coronary artery of native heart without angina pectoris 09/14/2018   (HFpEF) heart failure  with preserved ejection fraction (HCC)    Chronic pain of left knee 05/15/2018   Mass of left knee 05/15/2018   Non-ST elevation (NSTEMI) myocardial infarction [facility])    Chest pain 01/25/2018   RA (rheumatoid arthritis) (HCC) 01/25/2018   Hyperlipidemia 01/25/2018   Hypokalemia 01/25/2018   Depression with anxiety 01/25/2018   Tobacco abuse 01/25/2018   Lower extremity cellulitis 01/25/2018   Benzodiazepine dependence (HCC)    MDD (major depressive disorder), recurrent severe, without  psychosis (HCC) 10/02/2017   CTS (carpal tunnel syndrome) 06/08/2014    PCP: Espinoza, Alejandra, DO   REFERRING PROVIDER: Espinoza, Alejandra, DO  REFERRING DIAG: Z91.81 (ICD-10-CM) - Risk for falls  THERAPY DIAG:  No diagnosis found.  Rationale for Evaluation and Treatment: Rehabilitation  ONSET DATE: 12/30/2023  SUBJECTIVE:   SUBJECTIVE STATEMENT: Patient had left TKA and rehabbed well but had a fall going up steps and hit her right shin.  She had been falling frequently prior to the TKA  PERTINENT HISTORY: *** PAIN:  Are you having pain? Yes: NPRS scale: *** Pain location: *** Pain description: *** Aggravating factors: *** Relieving factors: ***  PRECAUTIONS: Fall  RED FLAGS: Bowel or bladder incontinence: Yes: age related   WEIGHT BEARING RESTRICTIONS: {Yes ***/No:24003}  FALLS:  Has patient fallen in last 6 months? {fallsyesno:27318}  LIVING ENVIRONMENT: Lives with: {[facility] lives with:25569::lives with their family} Lives in: {Lives in:25570} Stairs: {opstairs:27293} Has following equipment at home: {Assistive devices:23999}  OCCUPATION: ***  PLOF: {PLOF:24004}  PATIENT GOALS: To reduce the number of falls  NEXT MD VISIT: ***  OBJECTIVE:  Note: Objective measures were completed at Evaluation unless otherwise noted.  DIAGNOSTIC FINDINGS: na  PATIENT SURVEYS:  PSFS: THE PATIENT SPECIFIC FUNCTIONAL SCALE  Place score of 0-10 (0 = unable to perform activity and 10 = able to perform activity at the same level as before injury or problem)  Activity Date: ***         2.     3.     4.      Total Score ***      Total Score = Sum of activity scores/number of activities  Minimally Detectable Change: 3 points (for single activity); 2 points (for average score)  [facility] (nd). The Patient Specific Functional Scale . Retrieved from [email or web address]   COGNITION: Overall  cognitive status: {cognition:24006}     SENSATION: {sensation:27233}  EDEMA:  {edema:24020}  MUSCLE LENGTH: Hamstrings: Right *** deg; Left *** deg Debby test: Right *** deg; Left *** deg  POSTURE: {posture:25561}  PALPATION: ***  LOWER EXTREMITY ROM:  {AROM/PROM:27142} ROM Right eval Left eval  Hip flexion    Hip extension    Hip abduction    Hip adduction    Hip internal rotation    Hip external rotation    Knee flexion    Knee extension    Ankle dorsiflexion    Ankle plantarflexion    Ankle inversion    Ankle eversion     (Blank rows = not tested)  LOWER EXTREMITY MMT:  MMT Right eval Left eval  Hip flexion    Hip extension    Hip abduction    Hip adduction    Hip internal rotation    Hip external rotation    Knee flexion    Knee extension    Ankle dorsiflexion    Ankle plantarflexion    Ankle inversion    Ankle eversion     (Blank rows = not tested)  LOWER EXTREMITY SPECIAL TESTS:  {LEspecialtests:26242}  FUNCTIONAL TESTS:  {Functional tests:24029}  GAIT: Distance walked: *** Assistive device utilized: {Assistive devices:23999} Level of assistance: {Levels of assistance:24026} Comments: ***                                                                                                                                TREATMENT DATE: ***    PATIENT EDUCATION:  Education details: *** Person educated: {Person educated:25204} Education method: {Education Method:25205} Education comprehension: {Education Comprehension:25206}  HOME EXERCISE PROGRAM: ***  ASSESSMENT:  CLINICAL IMPRESSION: Patient is a *** y.o. *** who was seen today for physical therapy evaluation and treatment for ***.   OBJECTIVE IMPAIRMENTS: {opptimpairments:25111}.   ACTIVITY LIMITATIONS: {activitylimitations:27494}  PARTICIPATION LIMITATIONS: {participationrestrictions:25113}  PERSONAL FACTORS: {Personal factors:25162} are also affecting patient's functional  outcome.   REHAB POTENTIAL: {rehabpotential:25112}  CLINICAL DECISION MAKING: {clinical decision making:25114}  EVALUATION COMPLEXITY: {Evaluation complexity:25115}   GOALS: Goals reviewed with patient? {yes/no:20286}  SHORT TERM GOALS: Target date: *** *** Baseline: Goal status: INITIAL  2.  *** Baseline:  Goal status: INITIAL  3.  *** Baseline:  Goal status: INITIAL  4.  *** Baseline:  Goal status: INITIAL  5.  *** Baseline:  Goal status: INITIAL  6.  *** Baseline:  Goal status: INITIAL  LONG TERM GOALS: Target date: ***  *** Baseline:  Goal status: INITIAL  2.  *** Baseline:  Goal status: INITIAL  3.  *** Baseline:  Goal status: INITIAL  4.  *** Baseline:  Goal status: INITIAL  5.  *** Baseline:  Goal status: INITIAL  6.  *** Baseline:  Goal status: INITIAL   PLAN:  PT FREQUENCY: 1-2x/week  PT DURATION: 8 weeks  PLANNED INTERVENTIONS: 97110-Therapeutic exercises, 97530- Therapeutic activity, [id number]- Neuromuscular re-education, 97535- Self Care, 02859- Manual therapy, [id number]- Gait training, [phone]- Canalith repositioning, [id number]- Aquatic Therapy, [id number]- Electrical stimulation (unattended), [id number]- Electrical stimulation (manual), [id number]- Vasopneumatic device, [id number]- Ultrasound, [id number]- Ionotophoresis 4mg /ml Dexamethasone , 79439 (1-2 muscles), 20561 (3+ muscles)- Dry Needling, Patient/Family education, Balance training, Stair training, Taping, Joint mobilization, Spinal mobilization, DME instructions, Wheelchair mobility training, Cryotherapy, Moist heat, and Biofeedback  PLAN FOR NEXT SESSION: Balance training, LE strengthening, fall prevention.    Delon KATHEE Haddock, PT 03/02/2024, 9:50 AM  "

## 2024-03-05 ENCOUNTER — Ambulatory Visit

## 2024-03-11 ENCOUNTER — Emergency Department (HOSPITAL_BASED_OUTPATIENT_CLINIC_OR_DEPARTMENT_OTHER)

## 2024-03-11 ENCOUNTER — Emergency Department (HOSPITAL_BASED_OUTPATIENT_CLINIC_OR_DEPARTMENT_OTHER)
Admission: EM | Admit: 2024-03-11 | Discharge: 2024-03-11 | Disposition: A | Discharge status: Home / Self Care | Attending: Emergency Medicine | Admitting: Emergency Medicine

## 2024-03-11 ENCOUNTER — Other Ambulatory Visit: Payer: Self-pay

## 2024-03-11 DIAGNOSIS — R509 Fever, unspecified: Secondary | ICD-10-CM | POA: Insufficient documentation

## 2024-03-11 DIAGNOSIS — I509 Heart failure, unspecified: Secondary | ICD-10-CM | POA: Insufficient documentation

## 2024-03-11 DIAGNOSIS — Z7901 Long term (current) use of anticoagulants: Secondary | ICD-10-CM | POA: Insufficient documentation

## 2024-03-11 DIAGNOSIS — R531 Weakness: Secondary | ICD-10-CM

## 2024-03-11 DIAGNOSIS — I2510 Atherosclerotic heart disease of native coronary artery without angina pectoris: Secondary | ICD-10-CM | POA: Insufficient documentation

## 2024-03-11 LAB — COMPREHENSIVE METABOLIC PANEL WITH GFR
ALT: 23 U/L (ref 0–44)
AST: 29 U/L (ref 15–41)
Albumin: 4.2 g/dL (ref 3.5–5.0)
Alkaline Phosphatase: 105 U/L (ref 38–126)
Anion gap: 9 (ref 5–15)
BUN: 10 mg/dL (ref 8–23)
CO2: 30 mmol/L (ref 22–32)
Calcium: 9.7 mg/dL (ref 8.9–10.3)
Chloride: 103 mmol/L (ref 98–111)
Creatinine, Ser: 0.71 mg/dL (ref 0.44–1.00)
GFR, Estimated: >60 mL/min
Glucose, Bld: 108 mg/dL — ABNORMAL HIGH (ref 70–99)
Potassium: 4.1 mmol/L (ref 3.5–5.1)
Sodium: 142 mmol/L (ref 135–145)
Total Bilirubin: 0.6 mg/dL (ref 0.0–1.2)
Total Protein: 7 g/dL (ref 6.5–8.1)

## 2024-03-11 LAB — URINALYSIS, ROUTINE W REFLEX MICROSCOPIC
Bacteria, UA: NONE SEEN
Bilirubin Urine: NEGATIVE
Cellular Cast, UA: 6
Glucose, UA: NEGATIVE mg/dL
Ketones, ur: NEGATIVE mg/dL
Leukocytes,Ua: NEGATIVE
Nitrite: NEGATIVE
Protein, ur: 100 mg/dL — AB
Specific Gravity, Urine: 1.029 (ref 1.005–1.030)
pH: 5.5 (ref 5.0–8.0)

## 2024-03-11 LAB — CBC WITH DIFFERENTIAL/PLATELET
Abs Immature Granulocytes: 0.01 K/uL (ref 0.00–0.07)
Basophils Absolute: 0 K/uL (ref 0.0–0.1)
Basophils Relative: 1 %
Eosinophils Absolute: 0.1 K/uL (ref 0.0–0.5)
Eosinophils Relative: 3 %
HCT: 36.8 % (ref 36.0–46.0)
Hemoglobin: 12 g/dL (ref 12.0–15.0)
Immature Granulocytes: 0 %
Lymphocytes Relative: 19 %
Lymphs Abs: 0.6 K/uL — ABNORMAL LOW (ref 0.7–4.0)
MCH: 28.2 pg (ref 26.0–34.0)
MCHC: 32.6 g/dL (ref 30.0–36.0)
MCV: 86.4 fL (ref 80.0–100.0)
Monocytes Absolute: 0.2 K/uL (ref 0.1–1.0)
Monocytes Relative: 7 %
Neutro Abs: 2.2 K/uL (ref 1.7–7.7)
Neutrophils Relative %: 70 %
Platelets: 193 K/uL (ref 150–400)
RBC: 4.26 MIL/uL (ref 3.87–5.11)
RDW: 13.4 % (ref 11.5–15.5)
WBC: 3.2 K/uL — ABNORMAL LOW (ref 4.0–10.5)
nRBC: 0 % (ref 0.0–0.2)

## 2024-03-11 LAB — CBG MONITORING, ED: Glucose-Capillary: 99 mg/dL (ref 70–99)

## 2024-03-11 LAB — RESP PANEL BY RT-PCR (RSV, FLU A&B, COVID)  RVPGX2
Influenza A by PCR: NEGATIVE
Influenza B by PCR: NEGATIVE
Resp Syncytial Virus by PCR: NEGATIVE
SARS Coronavirus 2 by RT PCR: NEGATIVE

## 2024-03-11 LAB — PROTIME-INR
INR: 1 (ref 0.8–1.2)
Prothrombin Time: 13.7 s (ref 11.4–15.2)

## 2024-03-11 LAB — LIPASE, BLOOD: Lipase: 24 U/L (ref 11–51)

## 2024-03-11 LAB — LACTIC ACID, PLASMA: Lactic Acid, Venous: 1.6 mmol/L (ref 0.5–1.9)

## 2024-03-11 MED ORDER — SODIUM CHLORIDE 0.9 % IV SOLN
2.0000 g | Freq: Once | INTRAVENOUS | Status: DC
  Filled 2024-03-11: qty 12.5

## 2024-03-11 MED ORDER — DOXYCYCLINE HYCLATE 100 MG PO CAPS: 100.0000 mg | ORAL_CAPSULE | Freq: Two times a day (BID) | ORAL | 0 refills | Status: DC

## 2024-03-11 MED ORDER — SODIUM CHLORIDE 0.9 % IV BOLUS (SEPSIS)
1000.0000 mL | Freq: Once | INTRAVENOUS | Status: AC
  Administered 2024-03-11: 1000 mL via INTRAVENOUS

## 2024-03-11 MED ORDER — IOHEXOL 350 MG/ML SOLN
75.0000 mL | Freq: Once | INTRAVENOUS | Status: AC | PRN
  Administered 2024-03-11: 75 mL via INTRAVENOUS

## 2024-03-11 MED ORDER — SODIUM CHLORIDE 0.9 % IV SOLN
2.0000 g | Freq: Once | INTRAVENOUS | Status: DC
  Filled 2024-03-11: qty 20

## 2024-03-11 MED ORDER — ACETAMINOPHEN 500 MG PO TABS
1000.0000 mg | ORAL_TABLET | Freq: Once | ORAL | Status: AC
  Administered 2024-03-11: 1000 mg via ORAL
  Filled 2024-03-11: qty 2

## 2024-03-11 MED ORDER — SODIUM CHLORIDE 0.9 % IV SOLN
1.0000 g | Freq: Once | INTRAVENOUS | Status: AC
  Administered 2024-03-11: 1 g via INTRAVENOUS

## 2024-03-11 NOTE — ED Provider Notes (Signed)
 " [place] EMERGENCY DEPARTMENT AT [facility] Provider Note   CSN: [id number] Arrival date & time: 03/11/24  1623     Patient presents with: Altered Mental Status   Meghan Schwartz is a 74 y.o. female.   Patient with history of rheumatoid arthritis recent C. difficile anxiety heart failure CAD presents with altered mental status.  She has been more lethargic over the last 2 days.  Family unaware of any fever.  She just finished antibiotics for C. difficile recently.  Sounds like it might of come from antibiotic from UTI that she was diagnosed with a few weeks ago.  But it sounds like she never really finished a course of antibiotics for the presumed UTI but ultimately was not sure if it was a urinary tract infection.  She denies any abdominal pain.  Says diarrhea is resolved since finishing the treatment.  She does not have any cough or sputum production.  No headache no neck pain.  Family states that she has been sleeping the last 2 days.  But she has been known to be somewhat lethargic or sleepy at baseline.  She does have major depression disorder.  Anxiety.  She is on buprenorphine  and clonazepam  which could be playing a role in her sleepiness as well.  She takes these for chronic pain.  The history is provided by the patient.       Prior to Admission medications  Medication Sig Start Date End Date Taking? Authorizing Provider  doxycycline  (VIBRAMYCIN ) 100 MG capsule Take 1 capsule (100 mg total) by mouth 2 (two) times daily. 03/11/24  Yes Kyro Joswick, DO  acetaminophen  (TYLENOL ) 500 MG tablet Take 500 mg by mouth every 6 (six) hours as needed (For knee pain).    [provider]  Buprenorphine  HCl-Naloxone  HCl 5.7-1.4 MG SUBL Place 1 tablet under the tongue 3 (three) times daily. 08/12/23     Carboxymethylcellulose Sodium (THERATEARS) 0.25 % SOLN Place 2 drops into the left eye daily.    [provider]  cetirizine (ZYRTEC) 10 MG tablet Take 10 mg by mouth  daily.    [provider]  Cholecalciferol (VITAMIN D) 50 MCG (2000 UT) tablet Take 2,000 Units by mouth daily.    [provider]  citalopram  (CELEXA ) 10 MG tablet TK 1 T PO TID    [provider]  clonazePAM  (KLONOPIN ) 1 MG tablet Take 1 tablet (1 mg total) by mouth at bedtime. Patient taking differently: Take 0.5-1 mg by mouth See admin instructions. Take 1 mg every night and an additional 0.5 mg at lunch as needed for anxiety 08/23/22   Rosario Eland I, MD  clopidogrel  (PLAVIX ) 75 MG tablet TAKE 1 TABLET BY MOUTH EVERY DAY 05/19/23   Okey Vina GAILS, MD  cyclobenzaprine  (FLEXERIL ) 10 MG tablet Take 1 tablet (10 mg total) by mouth 3 (three) times daily as needed for spasms 08/05/23     docusate sodium  ([employer] STOOL SOFTENER) 100 MG capsule Take 100 mg by mouth at bedtime.    [provider]  escitalopram  (LEXAPRO ) 10 MG tablet Take 15 mg by mouth every morning. 08/31/22   [provider]  estradiol  (VIVELLE -DOT) 0.075 MG/24HR Place 1 patch onto the skin 2 (two) times a week. Tues. & Sat.    [provider]  etanercept  (ENBREL  SURECLICK) 50 MG/ML injection Inject 50 mg into the skin once a week. 02/18/24   Jeannetta Lonni ORN, MD  fidaxomicin  (DIFICID ) 200 MG TABS tablet Take 1 tablet (200  mg total) by mouth 2 (two) times daily. 02/20/24   Armenta Canning, MD  fluconazole  (DIFLUCAN ) 200 MG tablet Take 1 tablet (200 mg total) by mouth daily as needed for up to 1 dose. 02/18/24   Cottie Donnice PARAS, MD  HORIZANT  600 MG TBCR Take 1 tablet by mouth every evening. GABAPENTIN     [provider]  HORIZANT  600 MG TBCR Take 1 tablet (600 mg total) by mouth 2 (two) times daily. 09/08/23     methocarbamol  (ROBAXIN ) 500 MG tablet Take 1 tablet (500 mg total) by mouth every 6 (six) hours as needed for muscle spasms. 04/15/23   Edmisten, Roxie CROME, PA  metoprolol  tartrate (LOPRESSOR ) 25 MG tablet TAKE 1 TABLET BY MOUTH EVERY DAY 06/30/23   Okey Vina GAILS, MD   Multiple Vitamins-Minerals (MULTIVITAMIN WITH MINERALS) tablet Take 1 tablet by mouth daily. Woman 50+    [provider]  nitroGLYCERIN  (NITROSTAT ) 0.4 MG SL tablet Place 1 tablet (0.4 mg total) under the tongue every 5 (five) minutes as needed for chest pain. 01/28/18   Rai, Ripudeep MARLA, MD  ondansetron  (ZOFRAN ) 4 MG tablet Take 1 tablet (4 mg total) by mouth every 6 (six) hours as needed for nausea. 04/15/23   Edmisten, Kristie L, PA  ondansetron  (ZOFRAN -ODT) 4 MG disintegrating tablet Take by mouth daily. 10/26/22   [provider]  potassium chloride  SA (KLOR-CON  M) 20 MEQ tablet Take 1 tablet (20 mEq total) by mouth daily. 04/02/23   Okey Vina GAILS, MD  Probiotic Product (ALIGN) 4 MG CAPS Take 4 mg by mouth daily.    [provider]  rosuvastatin  (CRESTOR ) 40 MG tablet TAKE 1 TABLET BY MOUTH EVERY DAY 03/11/23   Okey Vina GAILS, MD  torsemide  (DEMADEX ) 20 MG tablet TAKE 1 TABLET BY MOUTH TWICE A DAY 04/17/23   Okey Vina GAILS, MD  triamcinolone  cream (KENALOG ) 0.1 % Apply 1 Application topically daily as needed (Itching skin). 10/01/22   [provider]  VITAMIN D, ERGOCALCIFEROL, PO Vitamin D (Ergocalciferol) 02/25/1899   [provider]  ZUBSOLV  5.7-1.4 MG SUBL Place 1 tablet under the tongue 3 (three) times daily. 09/04/22   [provider]    Allergies: Erythromycin    Review of Systems  Updated Vital Signs BP (!) 93/53   Pulse 64   Temp 98.9 F (37.2 C) (Oral)   Resp 12   SpO2 95%   Physical Exam Vitals and nursing note reviewed.  Constitutional:      General: She is not in acute distress.    Appearance: She is well-developed. She is not ill-appearing.     Comments: Somnolent but arousable  HENT:     Head: Normocephalic and atraumatic.     Nose: Nose normal.     Mouth/Throat:     Mouth: Mucous membranes are moist.  Eyes:     Extraocular Movements: Extraocular movements intact.     Conjunctiva/sclera: Conjunctivae normal.      Pupils: Pupils are equal, round, and reactive to light.  Cardiovascular:     Rate and Rhythm: Normal rate and regular rhythm.     Pulses: Normal pulses.     Heart sounds: Normal heart sounds. No murmur heard. Pulmonary:     Effort: Pulmonary effort is normal. No respiratory distress.     Breath sounds: Normal breath sounds.  Abdominal:     General: Abdomen is flat.     Palpations: Abdomen is soft.     Tenderness: There is no abdominal  tenderness.  Musculoskeletal:        General: No swelling. Normal range of motion.     Cervical back: Normal range of motion and neck supple.  Skin:    General: Skin is warm and dry.     Capillary Refill: Capillary refill takes less than 2 seconds.     Comments: Some nonspecific erythema to both shins bilaterally  Neurological:     General: No focal deficit present.     Mental Status: She is alert and oriented to person, place, and time.     Cranial Nerves: No cranial nerve deficit.     Sensory: No sensory deficit.     Motor: No weakness.     Coordination: Coordination normal.     Comments: Moves all extremities  Psychiatric:        Mood and Affect: Mood normal.     (all labs ordered are listed, but only abnormal results are displayed) Labs Reviewed  URINALYSIS, ROUTINE W REFLEX MICROSCOPIC - Abnormal; Notable for the following components:      Result Value   APPearance HAZY (*)    Hgb urine dipstick SMALL (*)    Protein, ur 100 (*)    All other components within normal limits  CBC WITH DIFFERENTIAL/PLATELET - Abnormal; Notable for the following components:   WBC 3.2 (*)    Lymphs Abs 0.6 (*)    All other components within normal limits  COMPREHENSIVE METABOLIC PANEL WITH GFR - Abnormal; Notable for the following components:   Glucose, Bld 108 (*)    All other components within normal limits  RESP PANEL BY RT-PCR (RSV, FLU A&B, COVID)  [id number]  CULTURE, BLOOD (ROUTINE X 2)  CULTURE, BLOOD (ROUTINE X 2)  LIPASE, BLOOD  LACTIC ACID,  PLASMA  PROTIME-INR  CBG MONITORING, ED    EKG: EKG Interpretation Date/Time:  Thursday March 11 2024 16:54:18 EST Ventricular Rate:  78 PR Interval:  177 QRS Duration:  74 QT Interval:  401 QTC Calculation: 457 R Axis:   42  Text Interpretation: Sinus rhythm Low voltage, extremity and precordial leads Confirmed by Ruthe Cornet [phone]) on 03/11/2024 5:44:52 PM  Radiology: CT Angio Chest PE W and/or Wo Contrast Result Date: 03/11/2024 EXAM: CTA CHEST PE WITHOUT AND WITH CONTRAST CT ABDOMEN AND PELVIS WITHOUT AND WITH CONTRAST 03/11/2024 07:34:31 PM TECHNIQUE: CTA of the chest was performed after the administration of 75 mL of iohexol  (OMNIPAQUE ) 350 MG/ML injection. Multiplanar reformatted images are provided for review. MIP images are provided for review. CT of the abdomen and pelvis was performed without and with the administration of intravenous contrast. Automated exposure control, iterative reconstruction, and/or weight based adjustment of the mA/kV was utilized to reduce the radiation dose to as low as reasonably achievable. COMPARISON: 08/20/2022 and 02/19/2024. CLINICAL HISTORY: Pulmonary embolism (PE) high probability, altered mental status, generalized weakness, anorexia, somnolence, colitis, urinary tract infection. FINDINGS: CHEST: PULMONARY ARTERIES: Pulmonary arteries are adequately opacified for evaluation. No intraluminal filling defect to suggest pulmonary embolism. Main pulmonary artery is normal in caliber. MEDIASTINUM: No mediastinal lymphadenopathy. The heart and pericardium demonstrate no acute abnormality. Extensive multivessel coronary artery calcifications. Mild atherosclerotic calcification within the thoracic aorta. LUNGS AND PLEURA: The lungs are without acute process. No focal consolidation or pulmonary edema. No pleural effusion or pneumothorax. SOFT TISSUES AND BONES: No acute bone or soft tissue abnormality. ABDOMEN AND PELVIS: LIVER: The liver is unremarkable.  GALLBLADDER AND BILE DUCTS: Cholelithiasis without superimposed pericholecystic inflammatory change. No intra or extrahepatic biliary  ductal dilation. SPLEEN: Spleen demonstrates no acute abnormality. PANCREAS: Pancreas demonstrates no acute abnormality. ADRENAL GLANDS: Adrenal glands demonstrate no acute abnormality. KIDNEYS, URETERS AND BLADDER: Subcortical cyst noted within the lower pole of the right kidney for which no follow up imaging is recommended. No stones in the kidneys or ureters. No hydronephrosis. No perinephric or periureteral stranding. Urinary bladder is unremarkable. GI AND BOWEL: Tiny hiatal hernia. Appendix normal. The stomach, small bowel, and large bowel are otherwise unremarkable. There is no bowel obstruction. No abnormal bowel wall thickening or distension. REPRODUCTIVE: Uterus absent. No adnexal masses. PERITONEUM AND RETROPERITONEUM: No ascites or free air. LYMPH NODES: No lymphadenopathy. BONES AND SOFT TISSUES: Osseous structures are age appropriate. No acute bone abnormality. No lytic or blastic bone lesion. Tiny fat-containing umbilical hernia. Tiny bilateral fat-containing inguinal hernias. Mild aortoiliac atherosclerotic calcification. No aortic aneurysm. No focal soft tissue abnormality. IMPRESSION: 1. No pulmonary embolism. 2. Extensive multivessel coronary artery calcifications. 3. Cholelithiasis without superimposed pericholecystic inflammatory change and no intrahepatic or extrahepatic biliary ductal dilation. 4. Raf score includes aortic atherosclerosis (ICD10-I70.0). Electronically signed by: Dorethia Molt MD 03/11/2024 07:47 PM EST RP Workstation: [id number]   CT ABDOMEN PELVIS W CONTRAST Result Date: 03/11/2024 EXAM: CTA CHEST PE WITHOUT AND WITH CONTRAST CT ABDOMEN AND PELVIS WITHOUT AND WITH CONTRAST 03/11/2024 07:34:31 PM TECHNIQUE: CTA of the chest was performed after the administration of 75 mL of iohexol  (OMNIPAQUE ) 350 MG/ML injection. Multiplanar reformatted  images are provided for review. MIP images are provided for review. CT of the abdomen and pelvis was performed without and with the administration of intravenous contrast. Automated exposure control, iterative reconstruction, and/or weight based adjustment of the mA/kV was utilized to reduce the radiation dose to as low as reasonably achievable. COMPARISON: 08/20/2022 and 02/19/2024. CLINICAL HISTORY: Pulmonary embolism (PE) high probability, altered mental status, generalized weakness, anorexia, somnolence, colitis, urinary tract infection. FINDINGS: CHEST: PULMONARY ARTERIES: Pulmonary arteries are adequately opacified for evaluation. No intraluminal filling defect to suggest pulmonary embolism. Main pulmonary artery is normal in caliber. MEDIASTINUM: No mediastinal lymphadenopathy. The heart and pericardium demonstrate no acute abnormality. Extensive multivessel coronary artery calcifications. Mild atherosclerotic calcification within the thoracic aorta. LUNGS AND PLEURA: The lungs are without acute process. No focal consolidation or pulmonary edema. No pleural effusion or pneumothorax. SOFT TISSUES AND BONES: No acute bone or soft tissue abnormality. ABDOMEN AND PELVIS: LIVER: The liver is unremarkable. GALLBLADDER AND BILE DUCTS: Cholelithiasis without superimposed pericholecystic inflammatory change. No intra or extrahepatic biliary ductal dilation. SPLEEN: Spleen demonstrates no acute abnormality. PANCREAS: Pancreas demonstrates no acute abnormality. ADRENAL GLANDS: Adrenal glands demonstrate no acute abnormality. KIDNEYS, URETERS AND BLADDER: Subcortical cyst noted within the lower pole of the right kidney for which no follow up imaging is recommended. No stones in the kidneys or ureters. No hydronephrosis. No perinephric or periureteral stranding. Urinary bladder is unremarkable. GI AND BOWEL: Tiny hiatal hernia. Appendix normal. The stomach, small bowel, and large bowel are otherwise unremarkable. There is  no bowel obstruction. No abnormal bowel wall thickening or distension. REPRODUCTIVE: Uterus absent. No adnexal masses. PERITONEUM AND RETROPERITONEUM: No ascites or free air. LYMPH NODES: No lymphadenopathy. BONES AND SOFT TISSUES: Osseous structures are age appropriate. No acute bone abnormality. No lytic or blastic bone lesion. Tiny fat-containing umbilical hernia. Tiny bilateral fat-containing inguinal hernias. Mild aortoiliac atherosclerotic calcification. No aortic aneurysm. No focal soft tissue abnormality. IMPRESSION: 1. No pulmonary embolism. 2. Extensive multivessel coronary artery calcifications. 3. Cholelithiasis without superimposed pericholecystic inflammatory change and no intrahepatic or  extrahepatic biliary ductal dilation. 4. Raf score includes aortic atherosclerosis (ICD10-I70.0). Electronically signed by: Dorethia Molt MD 03/11/2024 07:47 PM EST RP Workstation: [id number]   CT Head Wo Contrast Result Date: 03/11/2024 EXAM: CT HEAD WITHOUT CONTRAST 03/11/2024 05:51:26 PM TECHNIQUE: CT of the head was performed without the administration of intravenous contrast. Automated exposure control, iterative reconstruction, and/or weight based adjustment of the mA/kV was utilized to reduce the radiation dose to as low as reasonably achievable. COMPARISON: None available. CLINICAL HISTORY: Mental status change, unknown cause. FINDINGS: BRAIN AND VENTRICLES: Parenchymal volume loss is commensurate with the patient's age. Periventricular white matter changes are present likely reflecting the sequela of small vessel ischemia. No acute hemorrhage. No evidence of acute infarct. No hydrocephalus. No extra-axial collection. No mass effect or midline shift. ORBITS: No acute abnormality. SINUSES: Mastoid air cells and middle ear cavities are clear. There is mucosal thickening within the frontal and left sphenoid sinus as well as numerous opacified left ethmoid air cells in keeping with moderate paranasal sinus  disease. SOFT TISSUES AND SKULL: No acute soft tissue abnormality. No skull fracture. IMPRESSION: 1. No acute intracranial abnormality. 2. Chronic parenchymal volume loss and periventricular white matter changes consistent with small vessel ischemic disease. 3. Moderate paranasal sinus disease involving the frontal, left sphenoid, and left ethmoid sinuses. Electronically signed by: Dorethia Molt MD 03/11/2024 06:01 PM EST RP Workstation: [id number]   DG Chest Portable 1 View Result Date: 03/11/2024 EXAM: 1 VIEW(S) XRAY OF THE CHEST 03/11/2024 05:09:00 PM COMPARISON: 02/18/2024 CLINICAL HISTORY: ams ams ams ams ams Pt arrives with family with concerns of AMS that started yesterday. Family disorientation, generalized weakness, and decreased appetite. Family reports that pt has been sleeping more. Pt seen in past 3 weeks and treated for colitis and UTI. FINDINGS: LUNGS AND PLEURA: Left base atelectasis. No focal pulmonary opacity. No pleural effusion. No pneumothorax. HEART AND MEDIASTINUM: Atherosclerotic plaque. No acute abnormality of the cardiac and mediastinal silhouettes. BONES AND SOFT TISSUES: No acute osseous abnormality. IMPRESSION: 1. No acute findings. Electronically signed by: Morgane Naveau MD 03/11/2024 05:24 PM EST RP Workstation: [id number]     .Critical Care  Performed by: Ruthe Cornet, DO Authorized by: Ruthe Cornet, DO   Critical care provider statement:    Critical care time (minutes):  35   Critical care was necessary to treat or prevent imminent or life-threatening deterioration of the following conditions:  Sepsis   Critical care was time spent personally by me on the following activities:  Development of treatment plan with patient or surrogate, blood draw for specimens, discussions with primary provider, evaluation of patient's response to treatment, examination of patient, obtaining history from patient or surrogate, ordering and performing treatments and interventions,  ordering and review of laboratory studies, ordering and review of radiographic studies, pulse oximetry, re-evaluation of patient's condition and review of old charts   Care discussed with: admitting provider      Medications Ordered in the ED  sodium chloride  0.9 % bolus 1,000 mL (0 mLs Intravenous Stopped 03/11/24 1947)  acetaminophen  (TYLENOL ) tablet 1,000 mg (1,000 mg Oral Given 03/11/24 1730)  cefTRIAXone  (ROCEPHIN ) 1 g in sodium chloride  0.9 % 100 mL IVPB (0 g Intravenous Stopped 03/11/24 1758)  iohexol  (OMNIPAQUE ) 350 MG/ML injection 75 mL (75 mLs Intravenous Contrast Given 03/11/24 1923)  Medical Decision Making Amount and/or Complexity of Data Reviewed Labs: ordered. Radiology: ordered.  Risk OTC drugs. Prescription drug management.   Meghan Schwartz is here with altered mental status.  Arrives with fever.  Unremarkable vitals otherwise.  Will pursue infectious workup.  Will do IV cefepime  in setting of may be C. difficile recently from IV Rocephin .  Questionable urinalysis a few weeks ago eventually got treated for C. difficile she says no abdominal pain no diarrhea since finishing treatment.  She is on buprenorphine  she is on benzodiazepines.  That could be playing a role in her chronic sleepiness but fever likely also playing a role.  Could be UTI could be viral process.  She has no headache no neck pain.  Family states that she is somewhat sedated at baseline which is likely from her chronic meds.  She does not really have abdominal tenderness on exam.  But she is somnolent she is able to be aroused she does not really endorse any other symptoms.  She also has some redness of both shins that could be a mild cellulitis  Overall patient lab work shows no obvious urinalysis.  White count is 3.2.  No significant anemia electrolyte abnormality otherwise.  Gallbladder and liver enzymes normal.  Lipase normal.  Viral panel negative.  Lactic acid 1.6.  CT scan  of her head chest abdomen and pelvis are unremarkable.  No PE no pneumonia.  Colitis is resolved.Some gallstones but no obvious infection of the gallbladder.  She is not tender in the abdomen on exam.  Overall fevers resolved.  Not sure if this is some other viral process but her viral panel was negative.  Could be a cellulitis.  Could be some sort of occult infection.  I do think polypharmacy is also playing a role in some of her somnolence.  She is feeling better.  She has no headache no neck pain.  I do not have any concern for meningitis but I did recommend that we admit her to further evaluate and follow-up blood cultures and maybe treat for maybe a cellulitis/do medication review.  She does not really tender in her abdomen but may need to consider further workup of gallstones.  But ultimately patient has capacity make decisions her family members at the bedside as well and she would like to leave AGAINST MEDICAL ADVICE.  She understands the risks and benefits of staying versus going.  I did state that if she got sicker that could lead to death and it be really important for us  to be there to help her but she understands and prefers to leave.  Conservatively will prescribe some doxycycline  to treat for may be a cellulitis.  Blood cultures are pending.  I am concerned that she might be developing sepsis/she is aware of this.  This chart was dictated using voice recognition software.  Despite best efforts to proofread,  errors can occur which can change the documentation meaning.      Final diagnoses:  Fever, unspecified fever cause  Weakness    ED Discharge Orders          Ordered    doxycycline  (VIBRAMYCIN ) 100 MG capsule  2 times daily        03/11/24 2104               Ruthe Cornet, DO 03/11/24 2107  "

## 2024-03-11 NOTE — Progress Notes (Signed)
 Elink is following code sepsis.

## 2024-03-11 NOTE — ED Triage Notes (Signed)
 Pt arrives with family with concerns of AMS that started yesterday. Family disorientation, generalized weakness, and decreased appetite. Family reports that pt has been sleeping more. Pt seen in past 3 weeks and treated for colitis and UTI.

## 2024-03-11 NOTE — Discharge Instructions (Signed)
 Overall I did want you to be admitted so that we can further workup your confusion/weakness/fever.  I am going to put you on antibiotic to maybe cover a skin infection.  But if symptoms worsen as we discussed I strongly recommend that you come back for reevaluation and likely admission.

## 2024-03-12 ENCOUNTER — Encounter (HOSPITAL_COMMUNITY): Payer: Self-pay

## 2024-03-12 ENCOUNTER — Inpatient Hospital Stay (HOSPITAL_COMMUNITY)
Admission: EM | Admit: 2024-03-12 | Discharge: 2024-03-14 | DRG: 871 | Disposition: A | Discharge status: Home / Self Care | Attending: Internal Medicine | Admitting: Internal Medicine

## 2024-03-12 ENCOUNTER — Emergency Department (HOSPITAL_COMMUNITY)

## 2024-03-12 DIAGNOSIS — Z823 Family history of stroke: Secondary | ICD-10-CM

## 2024-03-12 DIAGNOSIS — Z79899 Other long term (current) drug therapy: Secondary | ICD-10-CM

## 2024-03-12 DIAGNOSIS — E782 Mixed hyperlipidemia: Secondary | ICD-10-CM

## 2024-03-12 DIAGNOSIS — D72819 Decreased white blood cell count, unspecified: Secondary | ICD-10-CM | POA: Diagnosis present

## 2024-03-12 DIAGNOSIS — Z7902 Long term (current) use of antithrombotics/antiplatelets: Secondary | ICD-10-CM

## 2024-03-12 DIAGNOSIS — Z881 Allergy status to other antibiotic agents status: Secondary | ICD-10-CM

## 2024-03-12 DIAGNOSIS — L03115 Cellulitis of right lower limb: Secondary | ICD-10-CM | POA: Diagnosis present

## 2024-03-12 DIAGNOSIS — Z90722 Acquired absence of ovaries, bilateral: Secondary | ICD-10-CM

## 2024-03-12 DIAGNOSIS — E7800 Pure hypercholesterolemia, unspecified: Secondary | ICD-10-CM | POA: Diagnosis present

## 2024-03-12 DIAGNOSIS — Z8349 Family history of other endocrine, nutritional and metabolic diseases: Secondary | ICD-10-CM

## 2024-03-12 DIAGNOSIS — Z8249 Family history of ischemic heart disease and other diseases of the circulatory system: Secondary | ICD-10-CM | POA: Diagnosis not present

## 2024-03-12 DIAGNOSIS — R531 Weakness: Secondary | ICD-10-CM | POA: Diagnosis present

## 2024-03-12 DIAGNOSIS — Z90710 Acquired absence of both cervix and uterus: Secondary | ICD-10-CM

## 2024-03-12 DIAGNOSIS — Z8261 Family history of arthritis: Secondary | ICD-10-CM

## 2024-03-12 DIAGNOSIS — L03116 Cellulitis of left lower limb: Secondary | ICD-10-CM | POA: Diagnosis present

## 2024-03-12 DIAGNOSIS — Z8619 Personal history of other infectious and parasitic diseases: Secondary | ICD-10-CM | POA: Diagnosis not present

## 2024-03-12 DIAGNOSIS — E785 Hyperlipidemia, unspecified: Secondary | ICD-10-CM | POA: Diagnosis present

## 2024-03-12 DIAGNOSIS — M069 Rheumatoid arthritis, unspecified: Secondary | ICD-10-CM | POA: Diagnosis present

## 2024-03-12 DIAGNOSIS — F418 Other specified anxiety disorders: Secondary | ICD-10-CM | POA: Diagnosis not present

## 2024-03-12 DIAGNOSIS — J9691 Respiratory failure, unspecified with hypoxia: Principal | ICD-10-CM

## 2024-03-12 DIAGNOSIS — Z955 Presence of coronary angioplasty implant and graft: Secondary | ICD-10-CM | POA: Diagnosis not present

## 2024-03-12 DIAGNOSIS — F419 Anxiety disorder, unspecified: Secondary | ICD-10-CM | POA: Diagnosis present

## 2024-03-12 DIAGNOSIS — A419 Sepsis, unspecified organism: Secondary | ICD-10-CM | POA: Diagnosis present

## 2024-03-12 DIAGNOSIS — Z820 Family history of epilepsy and other diseases of the nervous system: Secondary | ICD-10-CM

## 2024-03-12 DIAGNOSIS — I5032 Chronic diastolic (congestive) heart failure: Secondary | ICD-10-CM | POA: Diagnosis present

## 2024-03-12 DIAGNOSIS — I10 Essential (primary) hypertension: Secondary | ICD-10-CM | POA: Diagnosis not present

## 2024-03-12 DIAGNOSIS — R6510 Systemic inflammatory response syndrome (SIRS) of non-infectious origin without acute organ dysfunction: Secondary | ICD-10-CM | POA: Diagnosis not present

## 2024-03-12 DIAGNOSIS — Z96652 Presence of left artificial knee joint: Secondary | ICD-10-CM | POA: Diagnosis present

## 2024-03-12 DIAGNOSIS — Z1152 Encounter for screening for COVID-19: Secondary | ICD-10-CM

## 2024-03-12 DIAGNOSIS — F329 Major depressive disorder, single episode, unspecified: Secondary | ICD-10-CM | POA: Diagnosis present

## 2024-03-12 DIAGNOSIS — Z803 Family history of malignant neoplasm of breast: Secondary | ICD-10-CM

## 2024-03-12 DIAGNOSIS — F17210 Nicotine dependence, cigarettes, uncomplicated: Secondary | ICD-10-CM | POA: Diagnosis present

## 2024-03-12 DIAGNOSIS — J9601 Acute respiratory failure with hypoxia: Secondary | ICD-10-CM | POA: Diagnosis present

## 2024-03-12 DIAGNOSIS — R509 Fever, unspecified: Secondary | ICD-10-CM | POA: Diagnosis not present

## 2024-03-12 DIAGNOSIS — Z5329 Procedure and treatment not carried out because of patient's decision for other reasons: Secondary | ICD-10-CM | POA: Diagnosis present

## 2024-03-12 DIAGNOSIS — Z87440 Personal history of urinary (tract) infections: Secondary | ICD-10-CM

## 2024-03-12 DIAGNOSIS — I252 Old myocardial infarction: Secondary | ICD-10-CM | POA: Diagnosis not present

## 2024-03-12 DIAGNOSIS — I2510 Atherosclerotic heart disease of native coronary artery without angina pectoris: Secondary | ICD-10-CM | POA: Diagnosis present

## 2024-03-12 DIAGNOSIS — G9341 Metabolic encephalopathy: Secondary | ICD-10-CM | POA: Diagnosis present

## 2024-03-12 DIAGNOSIS — I110 Hypertensive heart disease with heart failure: Secondary | ICD-10-CM | POA: Diagnosis present

## 2024-03-12 DIAGNOSIS — G8929 Other chronic pain: Secondary | ICD-10-CM | POA: Diagnosis present

## 2024-03-12 DIAGNOSIS — E876 Hypokalemia: Secondary | ICD-10-CM | POA: Diagnosis present

## 2024-03-12 DIAGNOSIS — Z9079 Acquired absence of other genital organ(s): Secondary | ICD-10-CM

## 2024-03-12 LAB — CBC WITH DIFFERENTIAL/PLATELET
Abs Immature Granulocytes: 0 K/uL (ref 0.00–0.07)
Basophils Absolute: 0 K/uL (ref 0.0–0.1)
Basophils Relative: 1 %
Eosinophils Absolute: 0.1 K/uL (ref 0.0–0.5)
Eosinophils Relative: 4 %
HCT: 39.1 % (ref 36.0–46.0)
Hemoglobin: 12.2 g/dL (ref 12.0–15.0)
Immature Granulocytes: 0 %
Lymphocytes Relative: 28 %
Lymphs Abs: 0.8 K/uL (ref 0.7–4.0)
MCH: 28.2 pg (ref 26.0–34.0)
MCHC: 31.2 g/dL (ref 30.0–36.0)
MCV: 90.5 fL (ref 80.0–100.0)
Monocytes Absolute: 0.3 K/uL (ref 0.1–1.0)
Monocytes Relative: 9 %
Neutro Abs: 1.6 K/uL — ABNORMAL LOW (ref 1.7–7.7)
Neutrophils Relative %: 58 %
Platelets: 186 K/uL (ref 150–400)
RBC: 4.32 MIL/uL (ref 3.87–5.11)
RDW: 13.2 % (ref 11.5–15.5)
WBC: 2.8 K/uL — ABNORMAL LOW (ref 4.0–10.5)
nRBC: 0 % (ref 0.0–0.2)

## 2024-03-12 LAB — I-STAT CHEM 8, ED
BUN: 8 mg/dL (ref 8–23)
Calcium, Ion: 1.13 mmol/L — ABNORMAL LOW (ref 1.15–1.40)
Chloride: 102 mmol/L (ref 98–111)
Creatinine, Ser: 0.8 mg/dL (ref 0.44–1.00)
Glucose, Bld: 106 mg/dL — ABNORMAL HIGH (ref 70–99)
HCT: 38 % (ref 36.0–46.0)
Hemoglobin: 12.9 g/dL (ref 12.0–15.0)
Potassium: 3.8 mmol/L (ref 3.5–5.1)
Sodium: 140 mmol/L (ref 135–145)
TCO2: 27 mmol/L (ref 22–32)

## 2024-03-12 LAB — RESPIRATORY PANEL BY PCR

## 2024-03-12 LAB — PROCALCITONIN: Procalcitonin: <0.1 ng/mL

## 2024-03-12 LAB — CBG MONITORING, ED: Glucose-Capillary: 103 mg/dL — ABNORMAL HIGH (ref 70–99)

## 2024-03-12 LAB — PRO BRAIN NATRIURETIC PEPTIDE: Pro Brain Natriuretic Peptide: 746 pg/mL — ABNORMAL HIGH

## 2024-03-12 LAB — SEDIMENTATION RATE: Sed Rate: 11 mm/h (ref 0–22)

## 2024-03-12 LAB — C-REACTIVE PROTEIN: CRP: 0.6 mg/dL

## 2024-03-12 LAB — MRSA NEXT GEN BY PCR, NASAL: MRSA by PCR Next Gen: NOT DETECTED

## 2024-03-12 LAB — LACTIC ACID, PLASMA: Lactic Acid, Venous: 0.9 mmol/L (ref 0.5–1.9)

## 2024-03-12 MED ORDER — BUPRENORPHINE HCL-NALOXONE HCL 2-0.5 MG SL SUBL
1.0000 | SUBLINGUAL_TABLET | Freq: Every day | SUBLINGUAL | Status: DC
  Administered 2024-03-12 – 2024-03-13 (×2): 1 via SUBLINGUAL
  Filled 2024-03-12 (×2): qty 1

## 2024-03-12 MED ORDER — SODIUM CHLORIDE 0.9% FLUSH
3.0000 mL | Freq: Two times a day (BID) | INTRAVENOUS | Status: DC
  Administered 2024-03-12 – 2024-03-14 (×4): 3 mL via INTRAVENOUS

## 2024-03-12 MED ORDER — ONDANSETRON HCL 4 MG/2ML IJ SOLN: 4.0000 mg | Freq: Four times a day (QID) | INTRAMUSCULAR | Status: DC | PRN

## 2024-03-12 MED ORDER — LORATADINE 10 MG PO TABS
10.0000 mg | ORAL_TABLET | Freq: Every day | ORAL | Status: DC
  Administered 2024-03-12 – 2024-03-14 (×3): 10 mg via ORAL
  Filled 2024-03-12 (×3): qty 1

## 2024-03-12 MED ORDER — ALBUTEROL SULFATE (2.5 MG/3ML) 0.083% IN NEBU: 2.5000 mg | INHALATION_SOLUTION | RESPIRATORY_TRACT | Status: DC | PRN

## 2024-03-12 MED ORDER — VANCOMYCIN HCL 1500 MG/300ML IV SOLN
1500.0000 mg | INTRAVENOUS | Status: DC
  Administered 2024-03-13 – 2024-03-14 (×2): 1500 mg via INTRAVENOUS
  Filled 2024-03-12 (×2): qty 300

## 2024-03-12 MED ORDER — ESCITALOPRAM OXALATE 10 MG PO TABS
15.0000 mg | ORAL_TABLET | Freq: Every morning | ORAL | Status: DC
  Administered 2024-03-12 – 2024-03-14 (×3): 15 mg via ORAL
  Filled 2024-03-12 (×3): qty 2

## 2024-03-12 MED ORDER — VANCOMYCIN HCL 1500 MG/300ML IV SOLN
1500.0000 mg | Freq: Once | INTRAVENOUS | Status: AC
  Administered 2024-03-12: 1500 mg via INTRAVENOUS
  Filled 2024-03-12: qty 300

## 2024-03-12 MED ORDER — ACETAMINOPHEN 325 MG PO TABS
650.0000 mg | ORAL_TABLET | Freq: Four times a day (QID) | ORAL | Status: DC | PRN
  Administered 2024-03-12 – 2024-03-13 (×3): 650 mg via ORAL
  Filled 2024-03-12 (×3): qty 2

## 2024-03-12 MED ORDER — ENOXAPARIN SODIUM 40 MG/0.4ML IJ SOSY
40.0000 mg | PREFILLED_SYRINGE | INTRAMUSCULAR | Status: DC
  Administered 2024-03-12 – 2024-03-13 (×2): 40 mg via SUBCUTANEOUS
  Filled 2024-03-12 (×2): qty 0.4

## 2024-03-12 MED ORDER — SODIUM CHLORIDE 0.9 % IV SOLN: INTRAVENOUS | Status: DC

## 2024-03-12 MED ORDER — ONDANSETRON HCL 4 MG PO TABS: 4.0000 mg | ORAL_TABLET | Freq: Four times a day (QID) | ORAL | Status: DC | PRN

## 2024-03-12 MED ORDER — SODIUM CHLORIDE 0.9 % IV SOLN
2.0000 g | INTRAVENOUS | Status: DC
  Administered 2024-03-12 – 2024-03-14 (×3): 2 g via INTRAVENOUS
  Filled 2024-03-12 (×3): qty 20

## 2024-03-12 MED ORDER — SACCHAROMYCES BOULARDII 250 MG PO CAPS
250.0000 mg | ORAL_CAPSULE | Freq: Two times a day (BID) | ORAL | Status: DC
  Administered 2024-03-12 – 2024-03-14 (×4): 250 mg via ORAL
  Filled 2024-03-12 (×4): qty 1

## 2024-03-12 MED ORDER — GABAPENTIN 300 MG PO CAPS
600.0000 mg | ORAL_CAPSULE | Freq: Two times a day (BID) | ORAL | Status: DC
  Administered 2024-03-12 (×2): 600 mg via ORAL
  Filled 2024-03-12 (×2): qty 2

## 2024-03-12 MED ORDER — CLONAZEPAM 0.5 MG PO TABS
0.5000 mg | ORAL_TABLET | Freq: Two times a day (BID) | ORAL | Status: DC | PRN
  Administered 2024-03-13 (×3): 0.5 mg via ORAL
  Filled 2024-03-12 (×3): qty 1

## 2024-03-12 MED ORDER — GABAPENTIN ENACARBIL ER 600 MG PO TBCR: 600.0000 mg | EXTENDED_RELEASE_TABLET | Freq: Two times a day (BID) | ORAL | Status: DC

## 2024-03-12 MED ORDER — ACETAMINOPHEN 650 MG RE SUPP: 650.0000 mg | Freq: Four times a day (QID) | RECTAL | Status: DC | PRN

## 2024-03-12 MED ORDER — POLYVINYL ALCOHOL 1.4 % OP SOLN
2.0000 [drp] | Freq: Every day | OPHTHALMIC | Status: DC
  Administered 2024-03-13: 2 [drp] via OPHTHALMIC
  Filled 2024-03-12: qty 15

## 2024-03-12 MED ORDER — TORSEMIDE 20 MG PO TABS
20.0000 mg | ORAL_TABLET | Freq: Two times a day (BID) | ORAL | Status: DC
  Administered 2024-03-12: 20 mg via ORAL
  Filled 2024-03-12 (×5): qty 1

## 2024-03-12 MED ORDER — RISAQUAD PO CAPS
1.0000 | ORAL_CAPSULE | Freq: Every day | ORAL | Status: DC
  Administered 2024-03-12 – 2024-03-14 (×3): 1 via ORAL
  Filled 2024-03-12 (×3): qty 1

## 2024-03-12 MED ORDER — ROSUVASTATIN CALCIUM 20 MG PO TABS
40.0000 mg | ORAL_TABLET | Freq: Every day | ORAL | Status: DC
  Administered 2024-03-12 – 2024-03-14 (×3): 40 mg via ORAL
  Filled 2024-03-12 (×3): qty 2

## 2024-03-12 MED ORDER — CLOPIDOGREL BISULFATE 75 MG PO TABS
75.0000 mg | ORAL_TABLET | Freq: Every day | ORAL | Status: DC
  Administered 2024-03-12 – 2024-03-14 (×3): 75 mg via ORAL
  Filled 2024-03-12 (×3): qty 1

## 2024-03-12 NOTE — ED Provider Notes (Signed)
 " [place] [facility] Provider Note   CSN: [id number] Arrival date & time: 03/12/24  0240     Patient presents with: Altered Mental Status   Meghan Schwartz is a 74 y.o. female.  Patient with past medical history significant for rheumatoid arthritis, recent C. difficile infection, anxiety, heart failure, CAD presents to the emergency room complaining of continued somnolence.  Patient was seen earlier last evening at one of the freestanding emergency departments due to mental status changes.  She was found to be somnolent.  Workup concerning for possible cellulitis versus polypharmacy.  It was recommended the patient be admitted for further evaluation but the patient left AGAINST MEDICAL ADVICE because she was beginning to feel better.  The patient's husband states that when she got home she started to feel weak, unable to get up her stairs, began to be more confused, and felt shaky.  At the time of my assessment the patient is alert and oriented x 3 but is very somnolent and slow to answer questions.  Upon arrival she was noted to have a room air saturation of 79% with improvement to 100% once nasal cannula was applied at 3 L/min. The patient recently finished antibiotics for a C. difficile infection.  Unclear as to the source but they believe it may have been an antibiotic due to possible UTI.  Patient states her diarrhea has resolved.  She denies cough, abdominal pain, headache, chest pain.  She does take buprenorphine  and clonazepam  for chronic pain.    Altered Mental Status      Prior to Admission medications  Medication Sig Start Date End Date Taking? Authorizing Provider  acetaminophen  (TYLENOL ) 500 MG tablet Take 500 mg by mouth every 6 (six) hours as needed (For knee pain).    [provider]  Buprenorphine  HCl-Naloxone  HCl 5.7-1.4 MG SUBL Place 1 tablet under the tongue 3 (three) times daily. 08/12/23     Carboxymethylcellulose Sodium  (THERATEARS) 0.25 % SOLN Place 2 drops into the left eye daily.    [provider]  cetirizine (ZYRTEC) 10 MG tablet Take 10 mg by mouth daily.    [provider]  Cholecalciferol (VITAMIN D) 50 MCG (2000 UT) tablet Take 2,000 Units by mouth daily.    [provider]  citalopram  (CELEXA ) 10 MG tablet TK 1 T PO TID    [provider]  clonazePAM  (KLONOPIN ) 1 MG tablet Take 1 tablet (1 mg total) by mouth at bedtime. Patient taking differently: Take 0.5-1 mg by mouth See admin instructions. Take 1 mg every night and an additional 0.5 mg at lunch as needed for anxiety 08/23/22   Rosario Eland I, MD  clopidogrel  (PLAVIX ) 75 MG tablet TAKE 1 TABLET BY MOUTH EVERY DAY 05/19/23   Okey Vina GAILS, MD  cyclobenzaprine  (FLEXERIL ) 10 MG tablet Take 1 tablet (10 mg total) by mouth 3 (three) times daily as needed for spasms 08/05/23     docusate sodium  ([employer] STOOL SOFTENER) 100 MG capsule Take 100 mg by mouth at bedtime.    [provider]  doxycycline  (VIBRAMYCIN ) 100 MG capsule Take 1 capsule (100 mg total) by mouth 2 (two) times daily. 03/11/24   Curatolo, Adam, DO  escitalopram  (LEXAPRO ) 10 MG tablet Take 15 mg by mouth every morning. 08/31/22   [provider]  estradiol  (VIVELLE -DOT) 0.075 MG/24HR Place 1 patch onto the skin 2 (two) times a week. Tues. & Sat.    [provider]  etanercept  (  ENBREL  SURECLICK) 50 MG/ML injection Inject 50 mg into the skin once a week. 02/18/24   Rice, Lonni ORN, MD  fidaxomicin  (DIFICID ) 200 MG TABS tablet Take 1 tablet (200 mg total) by mouth 2 (two) times daily. 02/20/24   Armenta Canning, MD  fluconazole  (DIFLUCAN ) 200 MG tablet Take 1 tablet (200 mg total) by mouth daily as needed for up to 1 dose. 02/18/24   Cottie Donnice PARAS, MD  HORIZANT  600 MG TBCR Take 1 tablet by mouth every evening. GABAPENTIN     [provider]  HORIZANT  600 MG TBCR Take 1 tablet (600 mg total) by mouth 2 (two) times daily.  09/08/23     methocarbamol  (ROBAXIN ) 500 MG tablet Take 1 tablet (500 mg total) by mouth every 6 (six) hours as needed for muscle spasms. 04/15/23   Edmisten, Roxie CROME, PA  metoprolol  tartrate (LOPRESSOR ) 25 MG tablet TAKE 1 TABLET BY MOUTH EVERY DAY 06/30/23   Okey Vina GAILS, MD  Multiple Vitamins-Minerals (MULTIVITAMIN WITH MINERALS) tablet Take 1 tablet by mouth daily. Woman 50+    [provider]  nitroGLYCERIN  (NITROSTAT ) 0.4 MG SL tablet Place 1 tablet (0.4 mg total) under the tongue every 5 (five) minutes as needed for chest pain. 01/28/18   Rai, Ripudeep K, MD  ondansetron  (ZOFRAN ) 4 MG tablet Take 1 tablet (4 mg total) by mouth every 6 (six) hours as needed for nausea. 04/15/23   Edmisten, Kristie L, PA  ondansetron  (ZOFRAN -ODT) 4 MG disintegrating tablet Take by mouth daily. 10/26/22   [provider]  potassium chloride  SA (KLOR-CON  M) 20 MEQ tablet Take 1 tablet (20 mEq total) by mouth daily. 04/02/23   Okey Vina GAILS, MD  Probiotic Product (ALIGN) 4 MG CAPS Take 4 mg by mouth daily.    [provider]  rosuvastatin  (CRESTOR ) 40 MG tablet TAKE 1 TABLET BY MOUTH EVERY DAY 03/11/23   Okey Vina GAILS, MD  torsemide  (DEMADEX ) 20 MG tablet TAKE 1 TABLET BY MOUTH TWICE A DAY 04/17/23   Okey Vina GAILS, MD  triamcinolone  cream (KENALOG ) 0.1 % Apply 1 Application topically daily as needed (Itching skin). 10/01/22   [provider]  VITAMIN D, ERGOCALCIFEROL, PO Vitamin D (Ergocalciferol) 02/25/1899   [provider]  ZUBSOLV  5.7-1.4 MG SUBL Place 1 tablet under the tongue 3 (three) times daily. 09/04/22   [provider]    Allergies: Erythromycin    Review of Systems  Updated Vital Signs BP 117/60 (BP Location: Right Arm)   Pulse 64   Temp 98.2 F (36.8 C)   Resp 17   Ht 5' 4 (1.626 m)   Wt 68 kg   SpO2 100%   BMI 25.75 kg/m   Physical Exam Vitals and nursing note reviewed.  Constitutional:      General: She is not in acute distress.     Appearance: She is well-developed. She is not ill-appearing.     Comments: Somnolent but arousable  HENT:     Head: Normocephalic and atraumatic.     Nose: Nose normal.     Mouth/Throat:     Mouth: Mucous membranes are moist.  Eyes:     Extraocular Movements: Extraocular movements intact.     Conjunctiva/sclera: Conjunctivae normal.     Pupils: Pupils are equal, round, and reactive to light.  Cardiovascular:     Rate and Rhythm: Normal rate and regular rhythm.     Pulses: Normal pulses.     Heart sounds: Normal heart sounds. No  murmur heard. Pulmonary:     Effort: Pulmonary effort is normal. No respiratory distress.     Breath sounds: Normal breath sounds.  Abdominal:     General: Abdomen is flat.     Palpations: Abdomen is soft.     Tenderness: There is no abdominal tenderness.  Musculoskeletal:        General: No swelling. Normal range of motion.     Cervical back: Normal range of motion and neck supple.  Skin:    General: Skin is warm and dry.     Capillary Refill: Capillary refill takes less than 2 seconds.     Comments: Some nonspecific erythema to both shins bilaterally  Neurological:     General: No focal deficit present.     Mental Status: She is alert and oriented to person, place, and time.     Cranial Nerves: No cranial nerve deficit.     Sensory: No sensory deficit.     Motor: No weakness.     Coordination: Coordination normal.     Comments: Moves all extremities  Psychiatric:        Mood and Affect: Mood normal.     (all labs ordered are listed, but only abnormal results are displayed) Labs Reviewed  CBG MONITORING, ED - Abnormal; Notable for the following components:      Result Value   Glucose-Capillary 103 (*)    All other components within normal limits  I-STAT CHEM 8, ED - Abnormal; Notable for the following components:   Glucose, Bld 106 (*)    Calcium , Ion 1.13 (*)    All other components within normal limits  CBG MONITORING, ED     EKG: None  Radiology: CT Angio Chest PE W and/or Wo Contrast Result Date: 03/11/2024 EXAM: CTA CHEST PE WITHOUT AND WITH CONTRAST CT ABDOMEN AND PELVIS WITHOUT AND WITH CONTRAST 03/11/2024 07:34:31 PM TECHNIQUE: CTA of the chest was performed after the administration of 75 mL of iohexol  (OMNIPAQUE ) 350 MG/ML injection. Multiplanar reformatted images are provided for review. MIP images are provided for review. CT of the abdomen and pelvis was performed without and with the administration of intravenous contrast. Automated exposure control, iterative reconstruction, and/or weight based adjustment of the mA/kV was utilized to reduce the radiation dose to as low as reasonably achievable. COMPARISON: 08/20/2022 and 02/19/2024. CLINICAL HISTORY: Pulmonary embolism (PE) high probability, altered mental status, generalized weakness, anorexia, somnolence, colitis, urinary tract infection. FINDINGS: CHEST: PULMONARY ARTERIES: Pulmonary arteries are adequately opacified for evaluation. No intraluminal filling defect to suggest pulmonary embolism. Main pulmonary artery is normal in caliber. MEDIASTINUM: No mediastinal lymphadenopathy. The heart and pericardium demonstrate no acute abnormality. Extensive multivessel coronary artery calcifications. Mild atherosclerotic calcification within the thoracic aorta. LUNGS AND PLEURA: The lungs are without acute process. No focal consolidation or pulmonary edema. No pleural effusion or pneumothorax. SOFT TISSUES AND BONES: No acute bone or soft tissue abnormality. ABDOMEN AND PELVIS: LIVER: The liver is unremarkable. GALLBLADDER AND BILE DUCTS: Cholelithiasis without superimposed pericholecystic inflammatory change. No intra or extrahepatic biliary ductal dilation. SPLEEN: Spleen demonstrates no acute abnormality. PANCREAS: Pancreas demonstrates no acute abnormality. ADRENAL GLANDS: Adrenal glands demonstrate no acute abnormality. KIDNEYS, URETERS AND BLADDER: Subcortical cyst  noted within the lower pole of the right kidney for which no follow up imaging is recommended. No stones in the kidneys or ureters. No hydronephrosis. No perinephric or periureteral stranding. Urinary bladder is unremarkable. GI AND BOWEL: Tiny hiatal hernia. Appendix normal. The stomach, small bowel, and large  bowel are otherwise unremarkable. There is no bowel obstruction. No abnormal bowel wall thickening or distension. REPRODUCTIVE: Uterus absent. No adnexal masses. PERITONEUM AND RETROPERITONEUM: No ascites or free air. LYMPH NODES: No lymphadenopathy. BONES AND SOFT TISSUES: Osseous structures are age appropriate. No acute bone abnormality. No lytic or blastic bone lesion. Tiny fat-containing umbilical hernia. Tiny bilateral fat-containing inguinal hernias. Mild aortoiliac atherosclerotic calcification. No aortic aneurysm. No focal soft tissue abnormality. IMPRESSION: 1. No pulmonary embolism. 2. Extensive multivessel coronary artery calcifications. 3. Cholelithiasis without superimposed pericholecystic inflammatory change and no intrahepatic or extrahepatic biliary ductal dilation. 4. Raf score includes aortic atherosclerosis (ICD10-I70.0). Electronically signed by: Dorethia Molt MD 03/11/2024 07:47 PM EST RP Workstation: [id number]   CT ABDOMEN PELVIS W CONTRAST Result Date: 03/11/2024 EXAM: CTA CHEST PE WITHOUT AND WITH CONTRAST CT ABDOMEN AND PELVIS WITHOUT AND WITH CONTRAST 03/11/2024 07:34:31 PM TECHNIQUE: CTA of the chest was performed after the administration of 75 mL of iohexol  (OMNIPAQUE ) 350 MG/ML injection. Multiplanar reformatted images are provided for review. MIP images are provided for review. CT of the abdomen and pelvis was performed without and with the administration of intravenous contrast. Automated exposure control, iterative reconstruction, and/or weight based adjustment of the mA/kV was utilized to reduce the radiation dose to as low as reasonably achievable. COMPARISON: 08/20/2022  and 02/19/2024. CLINICAL HISTORY: Pulmonary embolism (PE) high probability, altered mental status, generalized weakness, anorexia, somnolence, colitis, urinary tract infection. FINDINGS: CHEST: PULMONARY ARTERIES: Pulmonary arteries are adequately opacified for evaluation. No intraluminal filling defect to suggest pulmonary embolism. Main pulmonary artery is normal in caliber. MEDIASTINUM: No mediastinal lymphadenopathy. The heart and pericardium demonstrate no acute abnormality. Extensive multivessel coronary artery calcifications. Mild atherosclerotic calcification within the thoracic aorta. LUNGS AND PLEURA: The lungs are without acute process. No focal consolidation or pulmonary edema. No pleural effusion or pneumothorax. SOFT TISSUES AND BONES: No acute bone or soft tissue abnormality. ABDOMEN AND PELVIS: LIVER: The liver is unremarkable. GALLBLADDER AND BILE DUCTS: Cholelithiasis without superimposed pericholecystic inflammatory change. No intra or extrahepatic biliary ductal dilation. SPLEEN: Spleen demonstrates no acute abnormality. PANCREAS: Pancreas demonstrates no acute abnormality. ADRENAL GLANDS: Adrenal glands demonstrate no acute abnormality. KIDNEYS, URETERS AND BLADDER: Subcortical cyst noted within the lower pole of the right kidney for which no follow up imaging is recommended. No stones in the kidneys or ureters. No hydronephrosis. No perinephric or periureteral stranding. Urinary bladder is unremarkable. GI AND BOWEL: Tiny hiatal hernia. Appendix normal. The stomach, small bowel, and large bowel are otherwise unremarkable. There is no bowel obstruction. No abnormal bowel wall thickening or distension. REPRODUCTIVE: Uterus absent. No adnexal masses. PERITONEUM AND RETROPERITONEUM: No ascites or free air. LYMPH NODES: No lymphadenopathy. BONES AND SOFT TISSUES: Osseous structures are age appropriate. No acute bone abnormality. No lytic or blastic bone lesion. Tiny fat-containing umbilical hernia.  Tiny bilateral fat-containing inguinal hernias. Mild aortoiliac atherosclerotic calcification. No aortic aneurysm. No focal soft tissue abnormality. IMPRESSION: 1. No pulmonary embolism. 2. Extensive multivessel coronary artery calcifications. 3. Cholelithiasis without superimposed pericholecystic inflammatory change and no intrahepatic or extrahepatic biliary ductal dilation. 4. Raf score includes aortic atherosclerosis (ICD10-I70.0). Electronically signed by: Dorethia Molt MD 03/11/2024 07:47 PM EST RP Workstation: [id number]   CT Head Wo Contrast Result Date: 03/11/2024 EXAM: CT HEAD WITHOUT CONTRAST 03/11/2024 05:51:26 PM TECHNIQUE: CT of the head was performed without the administration of intravenous contrast. Automated exposure control, iterative reconstruction, and/or weight based adjustment of the mA/kV was utilized to reduce the radiation dose to as low as  reasonably achievable. COMPARISON: None available. CLINICAL HISTORY: Mental status change, unknown cause. FINDINGS: BRAIN AND VENTRICLES: Parenchymal volume loss is commensurate with the patient's age. Periventricular white matter changes are present likely reflecting the sequela of small vessel ischemia. No acute hemorrhage. No evidence of acute infarct. No hydrocephalus. No extra-axial collection. No mass effect or midline shift. ORBITS: No acute abnormality. SINUSES: Mastoid air cells and middle ear cavities are clear. There is mucosal thickening within the frontal and left sphenoid sinus as well as numerous opacified left ethmoid air cells in keeping with moderate paranasal sinus disease. SOFT TISSUES AND SKULL: No acute soft tissue abnormality. No skull fracture. IMPRESSION: 1. No acute intracranial abnormality. 2. Chronic parenchymal volume loss and periventricular white matter changes consistent with small vessel ischemic disease. 3. Moderate paranasal sinus disease involving the frontal, left sphenoid, and left ethmoid sinuses. Electronically  signed by: Dorethia Molt MD 03/11/2024 06:01 PM EST RP Workstation: [id number]   DG Chest Portable 1 View Result Date: 03/11/2024 EXAM: 1 VIEW(S) XRAY OF THE CHEST 03/11/2024 05:09:00 PM COMPARISON: 02/18/2024 CLINICAL HISTORY: ams ams ams ams ams Pt arrives with family with concerns of AMS that started yesterday. Family disorientation, generalized weakness, and decreased appetite. Family reports that pt has been sleeping more. Pt seen in past 3 weeks and treated for colitis and UTI. FINDINGS: LUNGS AND PLEURA: Left base atelectasis. No focal pulmonary opacity. No pleural effusion. No pneumothorax. HEART AND MEDIASTINUM: Atherosclerotic plaque. No acute abnormality of the cardiac and mediastinal silhouettes. BONES AND SOFT TISSUES: No acute osseous abnormality. IMPRESSION: 1. No acute findings. Electronically signed by: Morgane Naveau MD 03/11/2024 05:24 PM EST RP Workstation: [id number]     .Critical Care  Performed by: Logan Ubaldo NOVAK, PA-C Authorized by: Logan Ubaldo NOVAK, PA-C   Critical care provider statement:    Critical care time (minutes):  30   Critical care was necessary to treat or prevent imminent or life-threatening deterioration of the following conditions:  Respiratory failure (Decreased SpO2 requiring supplemental oxygen )   Critical care was time spent personally by me on the following activities:  Development of treatment plan with patient or surrogate, discussions with consultants, evaluation of patient's response to treatment, examination of patient, ordering and review of laboratory studies, ordering and review of radiographic studies, ordering and performing treatments and interventions, pulse oximetry, re-evaluation of patient's condition and review of old charts    Medications Ordered in the ED - No data to display                                  Medical Decision Making Amount and/or Complexity of Data Reviewed Radiology: ordered.  Risk Decision regarding  hospitalization.   This patient presents to the ED for concern of weakness, this involves an extensive number of treatment options, and is a complaint that carries with it a high risk of complications and morbidity.  The differential diagnosis includes electrolyte abnormality, infection, sepsis, intra-abdominal/surgical abdomen, intracranial abnormality, pulmonary embolism, others   Co morbidities / Chronic conditions that complicate the patient evaluation  As noted in HPI   Additional history obtained:  Additional history obtained from EMR External records from outside source obtained and reviewed including primary care notes   Lab Tests:  I Ordered, and personally interpreted labs.  The pertinent results include: Unremarkable Chem-8, unremarkable CBG   Imaging Studies ordered:  Imaging was completed at the earlier visit this evening including CT angio  chest PE study, CT head, chest x-ray.  No acute findings noted. 1. No pulmonary embolism. 2. Extensive multivessel coronary artery calcifications. 3. Cholelithiasis without superimposed pericholecystic inflammatory change and no intrahepatic or extrahepatic biliary ductal dilation. 4. Raf score includes aortic atherosclerosis   Cardiac Monitoring: / EKG:  The patient was maintained on a cardiac monitor.  I personally viewed and interpreted the cardiac monitored which showed an underlying rhythm of: Sinus rhythm   Problem List / ED Course / Critical interventions / Medication management  Patient received cefepime  earlier this evening for possible cellulitis   Consultations Obtained:  I requested consultation with the hospitalist, Dr.Rathore, and discussed lab and imaging findings as well as pertinent plan - they recommend: admission, plan on day team admission   Social Determinants of Health:  Patient is an occasional smoker   Test / Admission - Considered:  Patient with repeat episode of weakness.  Upon arrival  she was hypoxic with an oxygen  saturation of 79%.  This improved rapidly on 3 L of oxygen  via nasal cannula.  Unclear as to what is causing her oxygen  demand.  Workup has shown no sign of pneumonia, PE, other acute respiratory finding.  Patient attempted to go home AGAINST MEDICAL ADVICE earlier in the evening but was unable to stay home due to worsening symptoms.  At this time I feel patient would benefit from admission.  Question polypharmacy versus unknown infectious source.  Workup earlier was not consistent with sepsis.  She is afebrile at this time.  She does have some nonspecific erythematous area to the bilateral shins with some swelling but no obvious infection.      Final diagnoses:  Respiratory failure with hypoxia, unspecified chronicity [facility])  Weakness    ED Discharge Orders     None          Logan Ubaldo KATHEE DEVONNA 03/12/24 [id number]  "

## 2024-03-12 NOTE — ED Notes (Signed)
 Floor notified patient coming up

## 2024-03-12 NOTE — Progress Notes (Signed)
 Pharmacy Antibiotic Note  Meghan Schwartz is a 74 y.o. female admitted on 03/12/2024 with fever and resp failure with concern for sepsis. S/p C. difficile infection treatment with fidaxomicin  in decemember. WBC 2.8, afeb. Pharmacy has been consulted for vancomycin  dosing.  Plan: Vancomycin  1500mg  q24h (eAUC 516, Scr 0.8 used) F/u MRSA pcr, renal function, infectious work up, and narrow as able Vancomycin  levels as needed  Height: 5' 4 (162.6 cm) Weight: 68 kg (150 lb) IBW/kg (Calculated) : 54.7  Temp (24hrs), Avg:98.7 F (37.1 C), Min:97.6 F (36.4 C), Max:101.2 F (38.4 C)  Recent Labs  Lab 03/11/24 1726 03/12/24 0305 03/12/24 0306  WBC 3.2* 2.8*  --   CREATININE 0.71  --  0.80  LATICACIDVEN 1.6  --   --     Estimated Creatinine Clearance: 58.4 mL/min (by C-G formula based on SCr of 0.8 mg/dL).    Allergies[1]  Antimicrobials this admission: Ceftriaxone  1/15> Vancomycin  1/16 >  Dose adjustments this admission:  Thank you for allowing pharmacy to be a part of this patients care.  Leonor GORMAN Bash 03/12/2024 11:19 AM     [1]  Allergies Allergen Reactions   Erythromycin Nausea And Vomiting    stomach upset

## 2024-03-12 NOTE — H&P (Addendum)
 " History and Physical    Patient: Meghan Schwartz FMW:[id number] DOB: 09-16-1950 DOA: 03/12/2024 DOS: the patient was seen and examined on 03/12/2024 PCP: Chet Mad, DO  Patient coming from: Home  Chief Complaint:  Chief Complaint  Patient presents with   Altered Mental Status   HPI: Meghan Schwartz is a 74 y.o. female with  medical history significant of hypertension, hyperlipidemia, diastolic congestive heart failure, CAD, recent C. difficile infection, chronic pain, rheumatoid arthritis on Enbrel , anxiety, and depression who presents to being noted to be acutely altered. She is accompanied by her husband.  She reports having shaking, balance issues, and confusion. She has been experiencing shaking and difficulty maintaining balance, particularly when navigating stairs, for the past three days. Additionally, she has been sleeping more than usual and feels confused, requiring extra time to process information. She recently completed a course of medication for Clostridioides difficile infection, which she finished last weekend. During the treatment, she experienced diarrhea for twenty days, but this has since resolved. No cough, nausea, or vomiting prior to her hospital visit. She is currently on Enbrel  for rheumatoid arthritis.    She was evaluated yesterday at [facility] for being altered and more lethargic.  Reportedly patient had been treated for urinary tract infection a couple weeks ago and subsequently developed C. difficile for which she had just recently finished antibiotics.  Labs noted WBC 3.2, lactic acid 1.6, and rest of CMP within normal limits.  Chest x-ray showed no acute abnormality.  Influenza, COVID-19, and RSV screening were negative.  Urinalysis noted small hemoglobin with 11-20 RBCs/hpf and 6-10 WBCs.   CT of the head noted no acute intracranial abnormality with signs of chronic small vessel disease and moderate paranasal sinus disease.  CT angiogram of the chest  abdomen pelvis noted no pulmonary embolism, extensive multivessel coronary artery calcifications, and cholelithiasis without signs of cholecystitis.  Blood cultures were obtained.  She was also noticed to have some nonspecific erythema to both shins bilaterally.  At that time there was concern for the possibility of cellulitis.  Patient had initially been given Rocephin  1 g IV., acetaminophen , and 1 L normal saline IV fluids IV.  However patient left AGAINST MEDICAL ADVICE.  In the emergency department patient was noted to be febrile up to 101.2 F with O2 saturations as low as 79% with improvement on 2 L nasal cannula oxygen .  Chest x-ray noted linear atelectatic foci in the lung bases without appreciable pneumonia.  Labs this morning revealed WBC to be 2.8 and procalcitonin <0.01.  Review of Systems: As mentioned in the history of present illness. All other systems reviewed and are negative. Past Medical History:  Diagnosis Date   Allergic rhinitis    Anxiety    Benzodiazepine dependence (HCC)    Bruxism    CAD (coronary artery disease)    S/p NSTEMI 01/2018 >> LHC: oLAD 20, pLAD 20; oD1 20, oLCx 40, pRCA 99 >> PCI: DES to prox RCA // Echo 01/2018: EF 50-55; prob inf-lat and inf HK   Chronic diastolic heart failure    Echocardiogram 07/2018: EF 55-60, grade 1 diastolic dysfunction, normal wall motion, normal GLS (-22.3), PASP 28   Chronic foot pain    bunions, torn ligaments-on chronic pain medication   CTS (carpal tunnel syndrome) 06/08/2014   Depression    High cholesterol    Hypertension    Low blood potassium    MDD (major depressive disorder) 10/02/2017   Myocardial infarction (HCC) 02/01/2018  Pneumonia 1986   left lung   RA (rheumatoid arthritis) (HCC)    Past Surgical History:  Procedure Laterality Date   BTL  2000   CARPAL TUNNEL RELEASE Left    CESAREAN SECTION  1978. 1983   CORONARY STENT INTERVENTION N/A 01/27/2018   Procedure: CORONARY STENT INTERVENTION;  Surgeon:  Verlin Lonni BIRCH, MD;  Location: [facility];  Service: Cardiovascular;  Laterality: N/A;   eye implants     FOOT SURGERY Left 2008   front teeth removed after injury     KNEE ARTHROSCOPY WITH LATERAL MENISECTOMY Left 02/02/2019   Procedure: LEFT KNEE ARTHROSCOPY, MEDIAL AND LATERAL MENISECTOMY, EXCISION OF LEFT LATERAL MENISCAL CYST;  Surgeon: Anderson Maude ORN, MD;  Location: [facility];  Service: Orthopedics;  Laterality: Left;   LEFT HEART CATH AND CORONARY ANGIOGRAPHY N/A 01/27/2018   Procedure: LEFT HEART CATH AND CORONARY ANGIOGRAPHY;  Surgeon: Verlin Lonni BIRCH, MD;  Location: [facility];  Service: Cardiovascular;  Laterality: N/A;   pre cancerous lesion removed from forehead     TOTAL ABDOMINAL HYSTERECTOMY W/ BILATERAL SALPINGOOPHORECTOMY  2002   Dr. Horacio   TOTAL KNEE ARTHROPLASTY Left 04/14/2023   Procedure: TOTAL KNEE ARTHROPLASTY;  Surgeon: Melodi Lerner, MD;  Location: [facility];  Service: Orthopedics;  Laterality: Left;   VAGINAL HYSTERECTOMY  2001   Social History:  reports that she has been smoking cigarettes. She started smoking about 54 years ago. She has a 13.5 pack-year smoking history. She has never been exposed to tobacco smoke. She has never used smokeless tobacco. She reports that she does not drink alcohol  and does not use drugs.  Allergies[1]  Family History  Problem Relation Age of Onset   Thyroid  disease Mother    Breast cancer Mother    Parkinson's disease Father    Raynaud syndrome Maternal Aunt    Arthritis Maternal Grandmother    Heart attack Maternal Grandmother    Stroke Paternal Grandmother    Heart attack Paternal Grandfather     Prior to Admission medications  Medication Sig Start Date End Date Taking? Authorizing Provider  acetaminophen  (TYLENOL ) 500 MG tablet Take 1,000 mg by mouth every 6 (six) hours as needed (For knee pain).   Yes [provider]  Buprenorphine  HCl-Naloxone  HCl 5.7-1.4 MG SUBL Place 1  tablet under the tongue 3 (three) times daily. 08/12/23  Yes   Carboxymethylcellulose Sodium (THERATEARS) 0.25 % SOLN Place 2 drops into the left eye daily.   Yes [provider]  cetirizine (ZYRTEC) 10 MG tablet Take 10 mg by mouth daily.   Yes [provider]  Cholecalciferol (VITAMIN D) 50 MCG (2000 UT) tablet Take 2,000 Units by mouth daily.   Yes [provider]  clonazePAM  (KLONOPIN ) 1 MG tablet Take 1 tablet (1 mg total) by mouth at bedtime. Patient taking differently: Take 0.5-1 mg by mouth See admin instructions. Take 1 mg every night and an additional 0.5 mg at lunch as needed for anxiety 08/23/22  Yes Rosario Eland I, MD  clopidogrel  (PLAVIX ) 75 MG tablet TAKE 1 TABLET BY MOUTH EVERY DAY 05/19/23  Yes Okey Vina GAILS, MD  cyclobenzaprine  (FLEXERIL ) 10 MG tablet Take 1 tablet (10 mg total) by mouth 3 (three) times daily as needed for spasms 08/05/23  Yes   docusate sodium  ([employer] STOOL SOFTENER) 100 MG capsule Take 100 mg by mouth at bedtime.   Yes [provider]  escitalopram  (LEXAPRO ) 10 MG tablet Take 15 mg by mouth every  morning. 08/31/22  Yes [provider]  estradiol  (VIVELLE -DOT) 0.075 MG/24HR Place 1 patch onto the skin 2 (two) times a week. Tues. & Sat.   Yes [provider]  etanercept  (ENBREL  SURECLICK) 50 MG/ML injection Inject 50 mg into the skin once a week. 02/18/24  Yes Rice, Lonni ORN, MD  HORIZANT  600 MG TBCR Take 1 tablet (600 mg total) by mouth 2 (two) times daily. 09/08/23  Yes   metoprolol  tartrate (LOPRESSOR ) 25 MG tablet TAKE 1 TABLET BY MOUTH EVERY DAY 06/30/23  Yes Okey Vina GAILS, MD  Multiple Vitamins-Minerals (MULTIVITAMIN WITH MINERALS) tablet Take 1 tablet by mouth daily. Woman 50+   Yes [provider]  potassium chloride  SA (KLOR-CON  M) 20 MEQ tablet Take 1 tablet (20 mEq total) by mouth daily. 04/02/23  Yes Okey Vina GAILS, MD  Probiotic Product (ALIGN) 4 MG CAPS Take 4 mg by mouth daily.   Yes [provider]  rosuvastatin  (CRESTOR ) 40 MG tablet TAKE 1 TABLET BY MOUTH EVERY DAY 03/11/23  Yes Okey Vina GAILS, MD  torsemide  (DEMADEX ) 20 MG tablet TAKE 1 TABLET BY MOUTH TWICE A DAY 04/17/23  Yes Okey Vina GAILS, MD  doxycycline  (VIBRAMYCIN ) 100 MG capsule Take 1 capsule (100 mg total) by mouth 2 (two) times daily. Patient not taking: Reported on 03/12/2024 03/11/24   Ruthe Cornet, DO  nitroGLYCERIN  (NITROSTAT ) 0.4 MG SL tablet Place 1 tablet (0.4 mg total) under the tongue every 5 (five) minutes as needed for chest pain. Patient not taking: Reported on 03/12/2024 01/28/18   Rai, Nydia POUR, MD  ondansetron  (ZOFRAN ) 4 MG tablet Take 1 tablet (4 mg total) by mouth every 6 (six) hours as needed for nausea. Patient not taking: Reported on 03/12/2024 04/15/23   Reena Roxie CROME, [place]    Physical Exam: Vitals:   03/12/24 0430 03/12/24 0445 03/12/24 0600 03/12/24 0644  BP: 105/66 (!) 102/53 114/66   Pulse: 65 (!) 57 62   Resp: 12 11 14    Temp:    97.6 F (36.4 C)  TempSrc:    Tympanic  SpO2: 100% 100% 100%   Weight:      Height:       Constitutional: Elderly female who appears lethargic, but in no acute distress at this time Eyes: PERRL, lids and conjunctivae normal ENMT: Mucous membranes are moist. Posterior pharynx clear of any exudate or lesions.Normal dentition.  Neck: normal, supple, no masses, no thyromegaly Respiratory: clear to auscultation bilaterally, no wheezing, no crackles. Normal respiratory effort. No accessory muscle use.  Cardiovascular: Regular rate and rhythm, no murmurs / rubs / gallops. No extremity edema. 2+ pedal pulses. No carotid bruits.  Abdomen: no tenderness, no masses palpated. No hepatosplenomegaly. Bowel sounds positive.  Musculoskeletal: no clubbing / cyanosis. No joint deformity upper and lower extremities. Good ROM, no contractures. Normal muscle tone.  Skin: some mild erythema noted of the shins. Neurologic: CN 2-12 grossly intact.  Strength 5/5 in all 4.   Psychiatric:    Lethargic but easily arousable we will follow commands.  Normal mood Data Reviewed:  EKG revealed normal sinus rhythm at 70 bpm with minimal ST wave changes in the lateral leads.  Reviewed labs, imaging, and pertinent records as documented.  Assessment and Plan:  Fever SIRS/sepsis Patient presents with complaints of altered mental status, fever up to 101.2 F, and leukopenia (WBC 2.8).  Just recently treated for C. difficile with reports of resolution of diarrhea.   Noted to have concern for erythema  of the shins the lower extremities. CT imaging studies did not show any clear source for infection.  Influenza, COVID-19, and RSV screening were negative.  Urinalysis noted small hemoglobin with 11-20 RBCs/hpf, no bacteria seen, and 6-10 WBCs.  Question the possibility of cellulitis  - Admit to a telemetry bed - Follow-up blood cultures - Check procalcitonin and complete respiratory virus panel - Empiric antibiotics of vancomycin  and cefepime .  De-escalate when deemed medically appropriate - Continue Tylenol  as needed for fever  Acute respiratory failure with hypoxia On admission O2 saturations noted to be as low as 79% with O2 saturation currently maintained greater than 90% on 2 L of nasal cannula oxygen .  Chest x-ray noted linear atelectatic foci without appreciable pneumonia - Continuous pulse oximetry with oxygen  to maintain O2 saturation - Incentive spirometry  Acute metabolic encephalopathy Patient noted to be acutely altered and confused and slow to process things which was unlike her norm per the husband.  CT scan of the head did not reveal any acute abnormality.  Question if some of patient's symptoms is secondary to medication effect - Neurochecks - Limit sedating medications at this time  History of C. Difficile Patient just recently completed antibiotics for C. difficile infection. -  Probiotic  Essential hypertension Blood pressures currently noted to be  soft  91/58. - Initially held home blood pressure regimen due to soft blood pressures  Diastolic congestive heart failure Last echocardiogram noted EF to be 55 to 60% with grade 1 diastolic dysfunction. - Strict I&Os Daily weights - Add on proBNP - Continue torsemide  as tolerated  Rheumatoid arthritis Patient on Enbrel  in outpatient setting - Hold Enbrel   Coronary artery disease Hyperlipidemia - Continue Crestor  and Plavix   Anxiety/depression - Continue Lexapro  and clonazepam  but reduced to 0.5 mg twice daily as needed for anxiety  Chronic pain - Pharmacy substitution of buprenorphine  decreased  DVT prophylaxis: Lovenox   Advance Care Planning:   Code Status: Prior    Consults: None Family Communication: Husband updated at bedside  Severity of Illness: The appropriate patient status for this patient is INPATIENT. Inpatient status is judged to be reasonable and necessary in order to provide the required intensity of service to ensure the patient's safety. The patient's presenting symptoms, physical exam findings, and initial radiographic and laboratory data in the context of their chronic comorbidities is felt to place them at high risk for further clinical deterioration. Furthermore, it is not anticipated that the patient will be medically stable for discharge from the hospital within 2 midnights of admission.   * I certify that at the point of admission it is my clinical judgment that the patient will require inpatient hospital care spanning beyond 2 midnights from the point of admission due to high intensity of service, high risk for further deterioration and high frequency of surveillance required.*  Author: Maximino DELENA Sharps, MD 03/12/2024 7:08 AM  For on call review [email or web address].      [1]  Allergies Allergen Reactions   Erythromycin Nausea And Vomiting    stomach upset   "

## 2024-03-12 NOTE — ED Triage Notes (Addendum)
 Pt arrives to ED accompanied with husband c/o AMS, Appears pt was evaluated at [facility] yesterday evening for the same and code sepsis initiated. Pt left [facility] around 2200 AMA because  was feeling better once pt got home, pt started to feel bad again, increased weakness, increased confusion, and shakiness.pt is slow to respond in triage.

## 2024-03-12 NOTE — Progress Notes (Signed)
 New Admission Note:   Arrival Method: stretcher Mental Orientation: aa+ox4 Telemetry: SR Assessment: Completed Skin: ecchymosis, otherwise intact IV: SL Pain: denies Tubes: n/a Safety Measures: Safety Fall Prevention Plan has been given, discussed and signed Admission: Completed [facility] Orientation: Patient has been orientated to the room, unit and staff.  Family: present  Orders have been reviewed and implemented. Will continue to monitor the patient. Call light has been placed within reach and bed alarm has been activated.   Doyal Sias, RN

## 2024-03-12 NOTE — Plan of Care (Signed)

## 2024-03-13 DIAGNOSIS — J9691 Respiratory failure, unspecified with hypoxia: Secondary | ICD-10-CM

## 2024-03-13 DIAGNOSIS — J9601 Acute respiratory failure with hypoxia: Secondary | ICD-10-CM | POA: Diagnosis not present

## 2024-03-13 DIAGNOSIS — R6510 Systemic inflammatory response syndrome (SIRS) of non-infectious origin without acute organ dysfunction: Secondary | ICD-10-CM

## 2024-03-13 DIAGNOSIS — R531 Weakness: Secondary | ICD-10-CM | POA: Diagnosis not present

## 2024-03-13 DIAGNOSIS — G9341 Metabolic encephalopathy: Secondary | ICD-10-CM

## 2024-03-13 LAB — CBC WITH DIFFERENTIAL/PLATELET
Abs Immature Granulocytes: 0 K/uL (ref 0.00–0.07)
Basophils Absolute: 0 K/uL (ref 0.0–0.1)
Basophils Relative: 1 %
Eosinophils Absolute: 0.1 K/uL (ref 0.0–0.5)
Eosinophils Relative: 5 %
HCT: 34.6 % — ABNORMAL LOW (ref 36.0–46.0)
Hemoglobin: 11.2 g/dL — ABNORMAL LOW (ref 12.0–15.0)
Immature Granulocytes: 0 %
Lymphocytes Relative: 34 %
Lymphs Abs: 0.8 K/uL (ref 0.7–4.0)
MCH: 28 pg (ref 26.0–34.0)
MCHC: 32.4 g/dL (ref 30.0–36.0)
MCV: 86.5 fL (ref 80.0–100.0)
Monocytes Absolute: 0.3 K/uL (ref 0.1–1.0)
Monocytes Relative: 12 %
Neutro Abs: 1.2 K/uL — ABNORMAL LOW (ref 1.7–7.7)
Neutrophils Relative %: 48 %
Platelets: 160 K/uL (ref 150–400)
RBC: 4 MIL/uL (ref 3.87–5.11)
RDW: 13.1 % (ref 11.5–15.5)
WBC: 2.5 K/uL — ABNORMAL LOW (ref 4.0–10.5)
nRBC: 0 % (ref 0.0–0.2)

## 2024-03-13 LAB — BASIC METABOLIC PANEL WITH GFR
Anion gap: 9 (ref 5–15)
BUN: 10 mg/dL (ref 8–23)
CO2: 30 mmol/L (ref 22–32)
Calcium: 9 mg/dL (ref 8.9–10.3)
Chloride: 102 mmol/L (ref 98–111)
Creatinine, Ser: 0.7 mg/dL (ref 0.44–1.00)
GFR, Estimated: >60 mL/min
Glucose, Bld: 82 mg/dL (ref 70–99)
Potassium: 3.3 mmol/L — ABNORMAL LOW (ref 3.5–5.1)
Sodium: 141 mmol/L (ref 135–145)

## 2024-03-13 LAB — CBC
HCT: 34.5 % — ABNORMAL LOW (ref 36.0–46.0)
Hemoglobin: 11.3 g/dL — ABNORMAL LOW (ref 12.0–15.0)
MCH: 28 pg (ref 26.0–34.0)
MCHC: 32.8 g/dL (ref 30.0–36.0)
MCV: 85.4 fL (ref 80.0–100.0)
Platelets: 148 K/uL — ABNORMAL LOW (ref 150–400)
RBC: 4.04 MIL/uL (ref 3.87–5.11)
RDW: 13 % (ref 11.5–15.5)
WBC: 2.5 K/uL — ABNORMAL LOW (ref 4.0–10.5)
nRBC: 0 % (ref 0.0–0.2)

## 2024-03-13 MED ORDER — POTASSIUM CHLORIDE CRYS ER 20 MEQ PO TBCR
40.0000 meq | EXTENDED_RELEASE_TABLET | Freq: Once | ORAL | Status: AC
  Administered 2024-03-13: 40 meq via ORAL
  Filled 2024-03-13: qty 2

## 2024-03-13 NOTE — Progress Notes (Signed)
 "          [facility]                                                                              Meghan Schwartz, is a 74 y.o. female, DOB - May 18, 1950, FMW:[id number] Admit date - 03/12/2024    Outpatient Primary MD for the patient is Chet, Alejandra, DO  LOS - 1  days  Chief Complaint  Patient presents with   Altered Mental Status       Brief summary   Patient is a 74 year old female with HTN, HLP, diastolic CHF, CAD, recent C. difficile, chronic pain, RA on Enbrel , anxiety and depression presented with altered mental status.  Patient reported shaking, balance issues and confusion for last 3 days.  She had been sleeping more than usual and feeling confused, requiring extra time to process information.  She recently completed a course of medication for C. difficile infection last weekend.  Diarrhea has resolved.  Also reportedly had been treated for UTI a couple weeks ago and subsequently developed C. difficile.  Patient was evaluated at [facility] on 1/15 for same symptoms.  CTA chest abdomen pelvis showed no PE or acute abnormality.  There was concern for possibility of cellulitis on the shins.  She was given IV Rocephin  and fluids however patient left AMA. Patient returned back to ED with fever of 101.2 F, hypoxic O2 sat 79%, placed on 2 L O2 Chest x-ray showed linear atelectatic foci in the lung bases without appreciable pneumonia. Leukopenia 2.8   Assessment & Plan     Fever, SIRS/sepsis - Presented with altered mental status, fever 101.2 F, leukopenia, unclear etiology.  Recently treated for C. difficile with resolution of diarrhea.  Concern for possible cellulitis of lower extremities - Workup so far negative, CT imaging showed no clear source of infection. - UA negative for UTI.  COVID, flu, RSV, respiratory virus panel negative. - For now continue IV vancomycin  and cefepime , de-escalate in 24 hours if blood cultures remain negative    Acute respiratory  failure with hypoxia -Noted in ED, hypoxic with O2 sats 79%, improved on 2 L -Wean O2 as tolerated  -Currently stable, O2 sats 97 to 99%. -Chest x-ray with no appreciable pneumonia or pleural effusion   Acute metabolic encephalopathy -Noted to have confusion on admission -CT head did not reveal any acute abnormality -Limit sedating meds -Currently alert and oriented x3, appears close to her baseline.    History of C. Difficile Patient just recently completed antibiotics for C. difficile infection. -Currently no abdominal pain or diarrhea   Essential hypertension -Initial BP soft, currently stable.   - Home antihypertensive held   Diastolic congestive heart failure - Last echocardiogram noted EF to be 55 to 60% with grade 1 diastolic dysfunction. - Continue torsemide  20 mg twice daily, home dose   Rheumatoid arthritis Patient on Enbrel  in outpatient setting - Hold Enbrel    Coronary artery disease Hyperlipidemia - Continue Crestor  and Plavix    Anxiety/depression - Continue Lexapro  and clonazepam  but reduced to 0.5 mg twice daily as needed for anxiety   Chronic pain - Pharmacy substitution of buprenorphine  decreased - Hold Flexeril   Hypokalemia -  Replaced   Estimated body mass index is 25.88 kg/m as calculated from the following:   Height as of this encounter: 5' 4 (1.626 m).   Weight as of this encounter: 68.4 kg.  Code Status: Full code DVT Prophylaxis:  enoxaparin  (LOVENOX ) injection 40 mg Start: 03/12/24 1800   Level of Care: Level of care: Telemetry Family Communication: Updated patient Disposition Plan:      Remains inpatient appropriate: Hopefully DC home in next 24 to 48 hours if continues to improve   Procedures:  None  Consultants:   None  Antimicrobials:   Anti-infectives (From admission, onward)    Start     Dose/Rate Route Frequency Ordered Stop   03/13/24 1200  vancomycin  (VANCOREADY) IVPB 1500 mg/300 mL        1,500 mg 150 mL/hr over  120 Minutes Intravenous Every 24 hours 03/12/24 1219     03/12/24 1130  cefTRIAXone  (ROCEPHIN ) 2 g in sodium chloride  0.9 % 100 mL IVPB        2 g 200 mL/hr over 30 Minutes Intravenous Every 24 hours 03/12/24 1117     03/12/24 1130  vancomycin  (VANCOREADY) IVPB 1500 mg/300 mL        1,500 mg 150 mL/hr over 120 Minutes Intravenous  Once 03/12/24 1119 03/12/24 1820          Medications  acidophilus  1 capsule Oral Daily   artificial tears  2 drop Left Eye Daily   buprenorphine -naloxone   1 tablet Sublingual QHS   clopidogrel   75 mg Oral Daily   enoxaparin  (LOVENOX ) injection  40 mg Subcutaneous Q24H   escitalopram   15 mg Oral q morning   loratadine   10 mg Oral Daily   potassium chloride   40 mEq Oral Once   rosuvastatin   40 mg Oral Daily   saccharomyces boulardii  250 mg Oral BID   sodium chloride  flush  3 mL Intravenous Q12H   torsemide   20 mg Oral BID      Subjective:   Meghan Schwartz was seen and examined today.  Feeling better, alert and oriented x 3.  Afebrile.  Patient denies dizziness, chest pain, shortness of breath, abdominal pain, N/V. No acute events overnight.    Objective:   Vitals:   03/12/24 1954 03/13/24 0031 03/13/24 0422 03/13/24 0454  BP: 117/69 138/67 125/69   Pulse:  (!) 59 (!) 55   Resp: 18 17 16    Temp: 98.9 F (37.2 C) 98.2 F (36.8 C) 98.4 F (36.9 C)   TempSrc:  Oral    SpO2: 100% 99% 97%   Weight:    68.4 kg  Height:        Intake/Output Summary (Last 24 hours) at 03/13/2024 0827 Last data filed at 03/13/2024 0600 Gross per 24 hour  Intake 220 ml  Output 700 ml  Net -480 ml     Wt Readings from Last 3 Encounters:  03/13/24 68.4 kg  10/15/23 73 kg  10/07/23 71.8 kg     Exam General: Alert and oriented x 3, NAD Cardiovascular: S1 S2 auscultated,  RRR Respiratory: Clear to auscultation bilaterally, no wheezing Gastrointestinal: Soft, nontender, nondistended, + bowel sounds Ext: no pedal edema bilaterally Neuro: No new  deficits Skin: Very mild erythema on the shins Psych: Normal affect     Data Reviewed:  I have personally reviewed following labs    CBC Lab Results  Component Value Date   WBC 2.5 (L) 03/13/2024   RBC 4.04 03/13/2024   HGB 11.3 (  L) 03/13/2024   HCT 34.5 (L) 03/13/2024   MCV 85.4 03/13/2024   MCH 28.0 03/13/2024   PLT 148 (L) 03/13/2024   MCHC 32.8 03/13/2024   RDW 13.0 03/13/2024   LYMPHSABS 0.8 03/12/2024   MONOABS 0.3 03/12/2024   EOSABS 0.1 03/12/2024   BASOSABS 0.0 03/12/2024     Last metabolic panel Lab Results  Component Value Date   NA 141 03/13/2024   K 3.3 (L) 03/13/2024   CL 102 03/13/2024   CO2 30 03/13/2024   BUN 10 03/13/2024   CREATININE 0.70 03/13/2024   GLUCOSE 82 03/13/2024   GFRNONAA >60 03/13/2024   GFRAA 65 02/04/2020   CALCIUM  9.0 03/13/2024   PHOS 3.3 02/19/2024   PROT 7.0 03/11/2024   ALBUMIN 4.2 03/11/2024   LABGLOB 2.8 12/25/2021   AGRATIO 1.6 12/25/2021   BILITOT 0.6 03/11/2024   ALKPHOS 105 03/11/2024   AST 29 03/11/2024   ALT 23 03/11/2024   ANIONGAP 9 03/13/2024    CBG (last 3)  Recent Labs    03/11/24 1656 03/12/24 0254  GLUCAP 99 103*      Coagulation Profile: Recent Labs  Lab 03/11/24 1726  INR 1.0     Radiology Studies: I have personally reviewed the imaging studies  DG Chest Portable 1 View Result Date: 03/12/2024 EXAM: 1 VIEW XRAY OF THE CHEST 03/12/2024 05:15:00 AM COMPARISON: Portable chest 03/11/2024. CLINICAL HISTORY: New oxygen  requirement. FINDINGS: LUNGS AND PLEURA: Linear atelectatic foci are again noted in the lung bases. No focal pneumonia is evident. No pleural effusion. No pneumothorax. HEART AND MEDIASTINUM: There is aortic tortuosity and atherosclerosis with stable mediastinum. The cardiac size is normal. There are overlying telemetry leads. BONES AND SOFT TISSUES: No acute osseous abnormality. IMPRESSION: 1. Linear atelectatic foci in the lung bases without appreciable  pneumonia. 2. No acute  cardiopulmonary abnormality. Electronically signed by: Francis Quam MD 03/12/2024 05:26 AM EST RP Workstation: [id number]   CT Angio Chest PE W and/or Wo Contrast Result Date: 03/11/2024 EXAM: CTA CHEST PE WITHOUT AND WITH CONTRAST CT ABDOMEN AND PELVIS WITHOUT AND WITH CONTRAST 03/11/2024 07:34:31 PM TECHNIQUE: CTA of the chest was performed after the administration of 75 mL of iohexol  (OMNIPAQUE ) 350 MG/ML injection. Multiplanar reformatted images are provided for review. MIP images are provided for review. CT of the abdomen and pelvis was performed without and with the administration of intravenous contrast. Automated exposure control, iterative reconstruction, and/or weight based adjustment of the mA/kV was utilized to reduce the radiation dose to as low as reasonably achievable. COMPARISON: 08/20/2022 and 02/19/2024. CLINICAL HISTORY: Pulmonary embolism (PE) high probability, altered mental status, generalized weakness, anorexia, somnolence, colitis, urinary tract infection. FINDINGS: CHEST: PULMONARY ARTERIES: Pulmonary arteries are adequately opacified for evaluation. No intraluminal filling defect to suggest pulmonary embolism. Main pulmonary artery is normal in caliber. MEDIASTINUM: No mediastinal lymphadenopathy. The heart and pericardium demonstrate no acute abnormality. Extensive multivessel coronary artery calcifications. Mild atherosclerotic calcification within the thoracic aorta. LUNGS AND PLEURA: The lungs are without acute process. No focal consolidation or pulmonary edema. No pleural effusion or pneumothorax. SOFT TISSUES AND BONES: No acute bone or soft tissue abnormality. ABDOMEN AND PELVIS: LIVER: The liver is unremarkable. GALLBLADDER AND BILE DUCTS: Cholelithiasis without superimposed pericholecystic inflammatory change. No intra or extrahepatic biliary ductal dilation. SPLEEN: Spleen demonstrates no acute abnormality. PANCREAS: Pancreas demonstrates no acute abnormality. ADRENAL GLANDS:  Adrenal glands demonstrate no acute abnormality. KIDNEYS, URETERS AND BLADDER: Subcortical cyst noted within the lower pole of the right kidney  for which no follow up imaging is recommended. No stones in the kidneys or ureters. No hydronephrosis. No perinephric or periureteral stranding. Urinary bladder is unremarkable. GI AND BOWEL: Tiny hiatal hernia. Appendix normal. The stomach, small bowel, and large bowel are otherwise unremarkable. There is no bowel obstruction. No abnormal bowel wall thickening or distension. REPRODUCTIVE: Uterus absent. No adnexal masses. PERITONEUM AND RETROPERITONEUM: No ascites or free air. LYMPH NODES: No lymphadenopathy. BONES AND SOFT TISSUES: Osseous structures are age appropriate. No acute bone abnormality. No lytic or blastic bone lesion. Tiny fat-containing umbilical hernia. Tiny bilateral fat-containing inguinal hernias. Mild aortoiliac atherosclerotic calcification. No aortic aneurysm. No focal soft tissue abnormality. IMPRESSION: 1. No pulmonary embolism. 2. Extensive multivessel coronary artery calcifications. 3. Cholelithiasis without superimposed pericholecystic inflammatory change and no intrahepatic or extrahepatic biliary ductal dilation. 4. Raf score includes aortic atherosclerosis (ICD10-I70.0). Electronically signed by: Dorethia Molt MD 03/11/2024 07:47 PM EST RP Workstation: [id number]   CT ABDOMEN PELVIS W CONTRAST Result Date: 03/11/2024 EXAM: CTA CHEST PE WITHOUT AND WITH CONTRAST CT ABDOMEN AND PELVIS WITHOUT AND WITH CONTRAST 03/11/2024 07:34:31 PM TECHNIQUE: CTA of the chest was performed after the administration of 75 mL of iohexol  (OMNIPAQUE ) 350 MG/ML injection. Multiplanar reformatted images are provided for review. MIP images are provided for review. CT of the abdomen and pelvis was performed without and with the administration of intravenous contrast. Automated exposure control, iterative reconstruction, and/or weight based adjustment of the mA/kV was  utilized to reduce the radiation dose to as low as reasonably achievable. COMPARISON: 08/20/2022 and 02/19/2024. CLINICAL HISTORY: Pulmonary embolism (PE) high probability, altered mental status, generalized weakness, anorexia, somnolence, colitis, urinary tract infection. FINDINGS: CHEST: PULMONARY ARTERIES: Pulmonary arteries are adequately opacified for evaluation. No intraluminal filling defect to suggest pulmonary embolism. Main pulmonary artery is normal in caliber. MEDIASTINUM: No mediastinal lymphadenopathy. The heart and pericardium demonstrate no acute abnormality. Extensive multivessel coronary artery calcifications. Mild atherosclerotic calcification within the thoracic aorta. LUNGS AND PLEURA: The lungs are without acute process. No focal consolidation or pulmonary edema. No pleural effusion or pneumothorax. SOFT TISSUES AND BONES: No acute bone or soft tissue abnormality. ABDOMEN AND PELVIS: LIVER: The liver is unremarkable. GALLBLADDER AND BILE DUCTS: Cholelithiasis without superimposed pericholecystic inflammatory change. No intra or extrahepatic biliary ductal dilation. SPLEEN: Spleen demonstrates no acute abnormality. PANCREAS: Pancreas demonstrates no acute abnormality. ADRENAL GLANDS: Adrenal glands demonstrate no acute abnormality. KIDNEYS, URETERS AND BLADDER: Subcortical cyst noted within the lower pole of the right kidney for which no follow up imaging is recommended. No stones in the kidneys or ureters. No hydronephrosis. No perinephric or periureteral stranding. Urinary bladder is unremarkable. GI AND BOWEL: Tiny hiatal hernia. Appendix normal. The stomach, small bowel, and large bowel are otherwise unremarkable. There is no bowel obstruction. No abnormal bowel wall thickening or distension. REPRODUCTIVE: Uterus absent. No adnexal masses. PERITONEUM AND RETROPERITONEUM: No ascites or free air. LYMPH NODES: No lymphadenopathy. BONES AND SOFT TISSUES: Osseous structures are age appropriate.  No acute bone abnormality. No lytic or blastic bone lesion. Tiny fat-containing umbilical hernia. Tiny bilateral fat-containing inguinal hernias. Mild aortoiliac atherosclerotic calcification. No aortic aneurysm. No focal soft tissue abnormality. IMPRESSION: 1. No pulmonary embolism. 2. Extensive multivessel coronary artery calcifications. 3. Cholelithiasis without superimposed pericholecystic inflammatory change and no intrahepatic or extrahepatic biliary ductal dilation. 4. Raf score includes aortic atherosclerosis (ICD10-I70.0). Electronically signed by: Dorethia Molt MD 03/11/2024 07:47 PM EST RP Workstation: [id number]   CT Head Wo Contrast Result Date: 03/11/2024 EXAM: CT HEAD WITHOUT  CONTRAST 03/11/2024 05:51:26 PM TECHNIQUE: CT of the head was performed without the administration of intravenous contrast. Automated exposure control, iterative reconstruction, and/or weight based adjustment of the mA/kV was utilized to reduce the radiation dose to as low as reasonably achievable. COMPARISON: None available. CLINICAL HISTORY: Mental status change, unknown cause. FINDINGS: BRAIN AND VENTRICLES: Parenchymal volume loss is commensurate with the patient's age. Periventricular white matter changes are present likely reflecting the sequela of small vessel ischemia. No acute hemorrhage. No evidence of acute infarct. No hydrocephalus. No extra-axial collection. No mass effect or midline shift. ORBITS: No acute abnormality. SINUSES: Mastoid air cells and middle ear cavities are clear. There is mucosal thickening within the frontal and left sphenoid sinus as well as numerous opacified left ethmoid air cells in keeping with moderate paranasal sinus disease. SOFT TISSUES AND SKULL: No acute soft tissue abnormality. No skull fracture. IMPRESSION: 1. No acute intracranial abnormality. 2. Chronic parenchymal volume loss and periventricular white matter changes consistent with small vessel ischemic disease. 3. Moderate  paranasal sinus disease involving the frontal, left sphenoid, and left ethmoid sinuses. Electronically signed by: Dorethia Molt MD 03/11/2024 06:01 PM EST RP Workstation: [id number]   DG Chest Portable 1 View Result Date: 03/11/2024 EXAM: 1 VIEW(S) XRAY OF THE CHEST 03/11/2024 05:09:00 PM COMPARISON: 02/18/2024 CLINICAL HISTORY: ams ams ams ams ams Pt arrives with family with concerns of AMS that started yesterday. Family disorientation, generalized weakness, and decreased appetite. Family reports that pt has been sleeping more. Pt seen in past 3 weeks and treated for colitis and UTI. FINDINGS: LUNGS AND PLEURA: Left base atelectasis. No focal pulmonary opacity. No pleural effusion. No pneumothorax. HEART AND MEDIASTINUM: Atherosclerotic plaque. No acute abnormality of the cardiac and mediastinal silhouettes. BONES AND SOFT TISSUES: No acute osseous abnormality. IMPRESSION: 1. No acute findings. Electronically signed by: Morgane Naveau MD 03/11/2024 05:24 PM EST RP Workstation: [id number]       Nydia Distance M.D. [facility] 03/13/2024, 8:27 AM  Available via Epic secure chat 7am-7pm After 7 pm, please refer to night coverage provider listed on amion.    "

## 2024-03-14 DIAGNOSIS — J9601 Acute respiratory failure with hypoxia: Secondary | ICD-10-CM

## 2024-03-14 DIAGNOSIS — J9691 Respiratory failure, unspecified with hypoxia: Secondary | ICD-10-CM | POA: Diagnosis not present

## 2024-03-14 DIAGNOSIS — R531 Weakness: Secondary | ICD-10-CM | POA: Diagnosis not present

## 2024-03-14 DIAGNOSIS — R6510 Systemic inflammatory response syndrome (SIRS) of non-infectious origin without acute organ dysfunction: Secondary | ICD-10-CM | POA: Diagnosis not present

## 2024-03-14 LAB — BASIC METABOLIC PANEL WITH GFR
Anion gap: 8 (ref 5–15)
BUN: 12 mg/dL (ref 8–23)
CO2: 29 mmol/L (ref 22–32)
Calcium: 9.2 mg/dL (ref 8.9–10.3)
Chloride: 105 mmol/L (ref 98–111)
Creatinine, Ser: 0.68 mg/dL (ref 0.44–1.00)
GFR, Estimated: >60 mL/min
Glucose, Bld: 90 mg/dL (ref 70–99)
Potassium: 3.9 mmol/L (ref 3.5–5.1)
Sodium: 142 mmol/L (ref 135–145)

## 2024-03-14 LAB — CBC
HCT: 34.1 % — ABNORMAL LOW (ref 36.0–46.0)
Hemoglobin: 11.4 g/dL — ABNORMAL LOW (ref 12.0–15.0)
MCH: 27.9 pg (ref 26.0–34.0)
MCHC: 33.4 g/dL (ref 30.0–36.0)
MCV: 83.6 fL (ref 80.0–100.0)
Platelets: 152 K/uL (ref 150–400)
RBC: 4.08 MIL/uL (ref 3.87–5.11)
RDW: 13 % (ref 11.5–15.5)
WBC: 2.5 K/uL — ABNORMAL LOW (ref 4.0–10.5)
nRBC: 0 % (ref 0.0–0.2)

## 2024-03-14 LAB — URINE CULTURE: Culture: NO GROWTH

## 2024-03-14 MED ORDER — AMOXICILLIN-POT CLAVULANATE 875-125 MG PO TABS: 1.0000 | ORAL_TABLET | Freq: Two times a day (BID) | ORAL | 0 refills | Status: AC

## 2024-03-14 MED ORDER — BUPRENORPHINE HCL-NALOXONE HCL 5.7-1.4 MG SL SUBL: 1.0000 | SUBLINGUAL_TABLET | Freq: Two times a day (BID) | SUBLINGUAL | Status: AC

## 2024-03-14 MED ORDER — ONDANSETRON HCL 4 MG PO TABS: 4.0000 mg | ORAL_TABLET | Freq: Three times a day (TID) | ORAL | 0 refills | Status: AC | PRN

## 2024-03-14 MED ORDER — SODIUM CHLORIDE 0.9 % IV SOLN: 2.0000 g | Freq: Once | INTRAVENOUS | Status: DC

## 2024-03-14 NOTE — Discharge Summary (Signed)
 " Physician Discharge Summary   Patient: Meghan Schwartz MRN: [id number] DOB: [birth year]  Admit date:     03/12/2024  Discharge date: 03/14/24  Discharge Physician: Nydia Distance, MD    PCP: Chet Mad, DO   Recommendations at discharge:   Recommended to wean down pain medications and sedating medications.  Recommended to wean Suboxone  to 1 tablet sublingual twice daily and discuss without pain medication doctor regarding further adjustment. On gabapentin  600 mg twice daily, recommended to wean to 300 mg twice a day  Discontinue Flexeril   Discharge Diagnoses:    Fever   SIRS (systemic inflammatory response syndrome) (HCC)   mild lower extremity cellulitis   Acute respiratory failure with hypoxia (HCC)   Acute metabolic encephalopathy likely due to polypharmacy   History of Clostridium difficile infection   Essential hypertension   Chronic diastolic CHF (congestive heart failure) (HCC)   RA (rheumatoid arthritis) (HCC)   Hyperlipidemia   Coronary artery disease involving native coronary artery of native heart without angina pectoris   Depression with anxiety   Chronic pain Hypokalemia   Hospital Course: Patient is a 74 year old female with HTN, HLP, diastolic CHF, CAD, recent C. difficile, chronic pain, RA on Enbrel , anxiety and depression presented with altered mental status.  Patient reported shaking, balance issues and confusion for last 3 days.  She had been sleeping more than usual and feeling confused, requiring extra time to process information.  She recently completed a course of medication for C. difficile infection last weekend.  Diarrhea has resolved.  Also reportedly had been treated for UTI a couple weeks ago and subsequently developed C. difficile.   Patient was evaluated at [facility] on 1/15 for same symptoms.  CTA chest abdomen pelvis showed no PE or acute abnormality.  There was concern for possibility of cellulitis on the shins.  She was given IV Rocephin   and fluids however patient left AMA. Patient returned back to ED with fever of 101.2 F, hypoxic O2 sat 79%, placed on 2 L O2 Chest x-ray showed linear atelectatic foci in the lung bases without appreciable pneumonia. Leukopenia 2.8   Assessment and Plan:   Fever, SIRS/sepsis, mild lower extremity cellulitis - Presented with altered mental status, fever 101.2 F, leukopenia, unclear etiology.  Recently treated for C. difficile with resolution of diarrhea.  Concern for possible cellulitis of lower extremities - Workup so far negative, CT imaging showed no clear source of infection. - UA negative for UTI.  COVID, flu, RSV, respiratory virus panel negative. - Patient was placed on IV vancomycin  and cefepime , received for 3 days while inpatient, continue Augmentin  for 4 more days to complete full 7-day course   - SIRS resolved, afebrile, VSS   Acute respiratory failure with hypoxia -Noted in ED, hypoxic with O2 sats 79%, improved on 2 L -Chest x-ray with no appreciable pneumonia or pleural effusion -O2 weaned off, sats 95% on room air, afebrile.   Acute metabolic encephalopathy likely due to polypharmacy Chronic pain -Noted to have confusion on admission -CT head did not reveal any acute abnormality - Currently alert and oriented x 3, ambulating without difficulty in the hallway.  Husband at the bedside confirmed mental status back to baseline. -Strongly recommend limiting sedating meds, weaning pain medications.  Discussed in detail with the patient and her husband regarding weaning off buprenorphine , gabapentin .  She states that she is on Klonopin /benzodiazepines for bruxism and gets jittery on decreasing.  Recommend outpatient follow-up and weaning safely.     History  of C. Difficile Patient just recently completed antibiotics for C. difficile infection. -Currently no abdominal pain or diarrhea   Essential hypertension -Initial BP soft, currently stable.   - Resume outpatient  regimen   Diastolic congestive heart failure - Last echocardiogram noted EF to be 55 to 60% with grade 1 diastolic dysfunction. - Continue torsemide  20 mg twice daily, home dose, potassium replacement   Rheumatoid arthritis Patient on Enbrel  in outpatient setting - Enbrel  was held while inpatient, patient can resume upon discharge   Coronary artery disease Hyperlipidemia - Continue Crestor  and Plavix    Anxiety/depression - Continue Lexapro     Hypokalemia - Replaced     Estimated body mass index is 25.88 kg/m as calculated from the following:   Height as of this encounter: 5' 4 (1.626 m).   Weight as of this encounter: 68.4 kg.     Pain control - [place]  Controlled Substance Reporting System database was reviewed. and patient was instructed, not to drive, operate heavy machinery, perform activities at heights, swimming or participation in water  activities or provide baby-sitting services while on Pain, Sleep and Anxiety Medications; until their outpatient Physician has advised to do so again. Also recommended to not to take more than prescribed Pain, Sleep and Anxiety Medications.  Consultants: None Procedures performed: None Disposition: Home Diet recommendation:  Discharge Diet Orders (From admission, onward)     Start     Ordered   03/14/24 0000  Diet - low sodium heart healthy        03/14/24 1048            DISCHARGE MEDICATION: Allergies as of 03/14/2024       Reactions   Erythromycin Nausea And Vomiting   stomach upset        Medication List     PAUSE taking these medications    Horizant  600 MG Tbcr Wait to take this until your doctor or other care provider tells you to start again. Generic drug: Gabapentin  Enacarbil Take 1 tablet (600 mg total) by mouth 2 (two) times daily.       STOP taking these medications    cyclobenzaprine  10 MG tablet Commonly known as: FLEXERIL    doxycycline  100 MG capsule Commonly known as: VIBRAMYCIN         TAKE these medications    acetaminophen  500 MG tablet Commonly known as: TYLENOL  Take 1,000 mg by mouth every 6 (six) hours as needed (For knee pain).   Align 4 MG Caps Take 4 mg by mouth daily.   amoxicillin -clavulanate 875-125 MG tablet Commonly known as: AUGMENTIN  Take 1 tablet by mouth 2 (two) times daily for 4 days. Start taking on: March 15, 2024   Buprenorphine  HCl-Naloxone  HCl 5.7-1.4 MG Subl Place 1 tablet under the tongue 2 (two) times daily. What changed: when to take this   cetirizine 10 MG tablet Commonly known as: ZYRTEC Take 10 mg by mouth daily.   clonazePAM  1 MG tablet Commonly known as: KLONOPIN  Take 1 tablet (1 mg total) by mouth at bedtime. What changed:  how much to take when to take this additional instructions   clopidogrel  75 MG tablet Commonly known as: PLAVIX  TAKE 1 TABLET BY MOUTH EVERY DAY   [employer] Stool Softener 100 MG capsule Generic drug: docusate sodium  Take 100 mg by mouth at bedtime.   Enbrel  SureClick 50 MG/ML injection Generic drug: etanercept  Inject 50 mg into the skin once a week.   escitalopram  10 MG tablet Commonly known as: LEXAPRO  Take 15  mg by mouth every morning.   estradiol  0.075 MG/24HR Commonly known as: VIVELLE -DOT Place 1 patch onto the skin 2 (two) times a week. Tues. & Sat.   metoprolol  tartrate 25 MG tablet Commonly known as: LOPRESSOR  TAKE 1 TABLET BY MOUTH EVERY DAY   multivitamin with minerals tablet Take 1 tablet by mouth daily. Woman 50+   nitroGLYCERIN  0.4 MG SL tablet Commonly known as: NITROSTAT  Place 1 tablet (0.4 mg total) under the tongue every 5 (five) minutes as needed for chest pain.   ondansetron  4 MG tablet Commonly known as: ZOFRAN  Take 1 tablet (4 mg total) by mouth every 8 (eight) hours as needed for nausea or vomiting. What changed:  when to take this reasons to take this   potassium chloride  SA 20 MEQ tablet Commonly known as: KLOR-CON  M Take 1 tablet (20 mEq total)  by mouth daily.   rosuvastatin  40 MG tablet Commonly known as: CRESTOR  TAKE 1 TABLET BY MOUTH EVERY DAY   Theratears 0.25 % Soln Generic drug: Carboxymethylcellulose Sodium Place 2 drops into the left eye daily.   torsemide  20 MG tablet Commonly known as: DEMADEX  TAKE 1 TABLET BY MOUTH TWICE A DAY   Vitamin D 50 MCG (2000 UT) tablet Take 2,000 Units by mouth daily.        Follow-up Information     Espinoza, Alejandra, DO. Schedule an appointment as soon as possible for a visit in 2 week(s).   Specialty: Family Medicine Why: for hospital follow-up Contact information: [address] [place] [address] [place] [address] [phone]                Discharge Exam: Fredricka Weights   03/12/24 0252 03/13/24 0454  Weight: 68 kg 68.4 kg   S: Feels a lot better, ambulating in the hallway with her husband.  Alert and oriented, mental status back to baseline.  No fever chills, chest pain, shortness of breath.  Cellulitis improved.  BP (!) 148/66 (BP Location: Left Arm)   Pulse 66   Temp 97.8 F (36.6 C) (Oral)   Resp 16   Ht 5' 4 (1.626 m)   Wt 68.4 kg   SpO2 95%   BMI 25.88 kg/m   Physical Exam General: Alert and oriented x 3, NAD Cardiovascular: S1 S2 clear, RRR.  Respiratory: CTAB, no wheezing, rales or rhonchi Gastrointestinal: Soft, nontender, nondistended, NBS Ext: no pedal edema bilaterally Neuro: no new deficits Psych: Normal affect    Condition at discharge: fair  The results of significant diagnostics from this hospitalization (including imaging, microbiology, ancillary and laboratory) are listed below for reference.   Imaging Studies: DG Chest Portable 1 View Result Date: 03/12/2024 EXAM: 1 VIEW XRAY OF THE CHEST 03/12/2024 05:15:00 AM COMPARISON: Portable chest 03/11/2024. CLINICAL HISTORY: New oxygen  requirement. FINDINGS: LUNGS AND PLEURA: Linear atelectatic foci are again noted in the lung bases. No focal pneumonia is evident. No pleural  effusion. No pneumothorax. HEART AND MEDIASTINUM: There is aortic tortuosity and atherosclerosis with stable mediastinum. The cardiac size is normal. There are overlying telemetry leads. BONES AND SOFT TISSUES: No acute osseous abnormality. IMPRESSION: 1. Linear atelectatic foci in the lung bases without appreciable  pneumonia. 2. No acute cardiopulmonary abnormality. Electronically signed by: Francis Quam MD 03/12/2024 05:26 AM EST RP Workstation: [id number]   CT Angio Chest PE W and/or Wo Contrast Result Date: 03/11/2024 EXAM: CTA CHEST PE WITHOUT AND WITH CONTRAST CT ABDOMEN AND PELVIS WITHOUT AND WITH CONTRAST 03/11/2024 07:34:31 PM TECHNIQUE: CTA of  the chest was performed after the administration of 75 mL of iohexol  (OMNIPAQUE ) 350 MG/ML injection. Multiplanar reformatted images are provided for review. MIP images are provided for review. CT of the abdomen and pelvis was performed without and with the administration of intravenous contrast. Automated exposure control, iterative reconstruction, and/or weight based adjustment of the mA/kV was utilized to reduce the radiation dose to as low as reasonably achievable. COMPARISON: 08/20/2022 and 02/19/2024. CLINICAL HISTORY: Pulmonary embolism (PE) high probability, altered mental status, generalized weakness, anorexia, somnolence, colitis, urinary tract infection. FINDINGS: CHEST: PULMONARY ARTERIES: Pulmonary arteries are adequately opacified for evaluation. No intraluminal filling defect to suggest pulmonary embolism. Main pulmonary artery is normal in caliber. MEDIASTINUM: No mediastinal lymphadenopathy. The heart and pericardium demonstrate no acute abnormality. Extensive multivessel coronary artery calcifications. Mild atherosclerotic calcification within the thoracic aorta. LUNGS AND PLEURA: The lungs are without acute process. No focal consolidation or pulmonary edema. No pleural effusion or pneumothorax. SOFT TISSUES AND BONES: No acute bone or soft  tissue abnormality. ABDOMEN AND PELVIS: LIVER: The liver is unremarkable. GALLBLADDER AND BILE DUCTS: Cholelithiasis without superimposed pericholecystic inflammatory change. No intra or extrahepatic biliary ductal dilation. SPLEEN: Spleen demonstrates no acute abnormality. PANCREAS: Pancreas demonstrates no acute abnormality. ADRENAL GLANDS: Adrenal glands demonstrate no acute abnormality. KIDNEYS, URETERS AND BLADDER: Subcortical cyst noted within the lower pole of the right kidney for which no follow up imaging is recommended. No stones in the kidneys or ureters. No hydronephrosis. No perinephric or periureteral stranding. Urinary bladder is unremarkable. GI AND BOWEL: Tiny hiatal hernia. Appendix normal. The stomach, small bowel, and large bowel are otherwise unremarkable. There is no bowel obstruction. No abnormal bowel wall thickening or distension. REPRODUCTIVE: Uterus absent. No adnexal masses. PERITONEUM AND RETROPERITONEUM: No ascites or free air. LYMPH NODES: No lymphadenopathy. BONES AND SOFT TISSUES: Osseous structures are age appropriate. No acute bone abnormality. No lytic or blastic bone lesion. Tiny fat-containing umbilical hernia. Tiny bilateral fat-containing inguinal hernias. Mild aortoiliac atherosclerotic calcification. No aortic aneurysm. No focal soft tissue abnormality. IMPRESSION: 1. No pulmonary embolism. 2. Extensive multivessel coronary artery calcifications. 3. Cholelithiasis without superimposed pericholecystic inflammatory change and no intrahepatic or extrahepatic biliary ductal dilation. 4. Raf score includes aortic atherosclerosis (ICD10-I70.0). Electronically signed by: Dorethia Molt MD 03/11/2024 07:47 PM EST RP Workstation: [id number]   CT ABDOMEN PELVIS W CONTRAST Result Date: 03/11/2024 EXAM: CTA CHEST PE WITHOUT AND WITH CONTRAST CT ABDOMEN AND PELVIS WITHOUT AND WITH CONTRAST 03/11/2024 07:34:31 PM TECHNIQUE: CTA of the chest was performed after the administration of 75  mL of iohexol  (OMNIPAQUE ) 350 MG/ML injection. Multiplanar reformatted images are provided for review. MIP images are provided for review. CT of the abdomen and pelvis was performed without and with the administration of intravenous contrast. Automated exposure control, iterative reconstruction, and/or weight based adjustment of the mA/kV was utilized to reduce the radiation dose to as low as reasonably achievable. COMPARISON: 08/20/2022 and 02/19/2024. CLINICAL HISTORY: Pulmonary embolism (PE) high probability, altered mental status, generalized weakness, anorexia, somnolence, colitis, urinary tract infection. FINDINGS: CHEST: PULMONARY ARTERIES: Pulmonary arteries are adequately opacified for evaluation. No intraluminal filling defect to suggest pulmonary embolism. Main pulmonary artery is normal in caliber. MEDIASTINUM: No mediastinal lymphadenopathy. The heart and pericardium demonstrate no acute abnormality. Extensive multivessel coronary artery calcifications. Mild atherosclerotic calcification within the thoracic aorta. LUNGS AND PLEURA: The lungs are without acute process. No focal consolidation or pulmonary edema. No pleural effusion or pneumothorax. SOFT TISSUES AND BONES: No acute bone or soft tissue abnormality.  ABDOMEN AND PELVIS: LIVER: The liver is unremarkable. GALLBLADDER AND BILE DUCTS: Cholelithiasis without superimposed pericholecystic inflammatory change. No intra or extrahepatic biliary ductal dilation. SPLEEN: Spleen demonstrates no acute abnormality. PANCREAS: Pancreas demonstrates no acute abnormality. ADRENAL GLANDS: Adrenal glands demonstrate no acute abnormality. KIDNEYS, URETERS AND BLADDER: Subcortical cyst noted within the lower pole of the right kidney for which no follow up imaging is recommended. No stones in the kidneys or ureters. No hydronephrosis. No perinephric or periureteral stranding. Urinary bladder is unremarkable. GI AND BOWEL: Tiny hiatal hernia. Appendix normal. The  stomach, small bowel, and large bowel are otherwise unremarkable. There is no bowel obstruction. No abnormal bowel wall thickening or distension. REPRODUCTIVE: Uterus absent. No adnexal masses. PERITONEUM AND RETROPERITONEUM: No ascites or free air. LYMPH NODES: No lymphadenopathy. BONES AND SOFT TISSUES: Osseous structures are age appropriate. No acute bone abnormality. No lytic or blastic bone lesion. Tiny fat-containing umbilical hernia. Tiny bilateral fat-containing inguinal hernias. Mild aortoiliac atherosclerotic calcification. No aortic aneurysm. No focal soft tissue abnormality. IMPRESSION: 1. No pulmonary embolism. 2. Extensive multivessel coronary artery calcifications. 3. Cholelithiasis without superimposed pericholecystic inflammatory change and no intrahepatic or extrahepatic biliary ductal dilation. 4. Raf score includes aortic atherosclerosis (ICD10-I70.0). Electronically signed by: Dorethia Molt MD 03/11/2024 07:47 PM EST RP Workstation: [id number]   CT Head Wo Contrast Result Date: 03/11/2024 EXAM: CT HEAD WITHOUT CONTRAST 03/11/2024 05:51:26 PM TECHNIQUE: CT of the head was performed without the administration of intravenous contrast. Automated exposure control, iterative reconstruction, and/or weight based adjustment of the mA/kV was utilized to reduce the radiation dose to as low as reasonably achievable. COMPARISON: None available. CLINICAL HISTORY: Mental status change, unknown cause. FINDINGS: BRAIN AND VENTRICLES: Parenchymal volume loss is commensurate with the patient's age. Periventricular white matter changes are present likely reflecting the sequela of small vessel ischemia. No acute hemorrhage. No evidence of acute infarct. No hydrocephalus. No extra-axial collection. No mass effect or midline shift. ORBITS: No acute abnormality. SINUSES: Mastoid air cells and middle ear cavities are clear. There is mucosal thickening within the frontal and left sphenoid sinus as well as numerous  opacified left ethmoid air cells in keeping with moderate paranasal sinus disease. SOFT TISSUES AND SKULL: No acute soft tissue abnormality. No skull fracture. IMPRESSION: 1. No acute intracranial abnormality. 2. Chronic parenchymal volume loss and periventricular white matter changes consistent with small vessel ischemic disease. 3. Moderate paranasal sinus disease involving the frontal, left sphenoid, and left ethmoid sinuses. Electronically signed by: Dorethia Molt MD 03/11/2024 06:01 PM EST RP Workstation: [id number]   DG Chest Portable 1 View Result Date: 03/11/2024 EXAM: 1 VIEW(S) XRAY OF THE CHEST 03/11/2024 05:09:00 PM COMPARISON: 02/18/2024 CLINICAL HISTORY: ams ams ams ams ams Pt arrives with family with concerns of AMS that started yesterday. Family disorientation, generalized weakness, and decreased appetite. Family reports that pt has been sleeping more. Pt seen in past 3 weeks and treated for colitis and UTI. FINDINGS: LUNGS AND PLEURA: Left base atelectasis. No focal pulmonary opacity. No pleural effusion. No pneumothorax. HEART AND MEDIASTINUM: Atherosclerotic plaque. No acute abnormality of the cardiac and mediastinal silhouettes. BONES AND SOFT TISSUES: No acute osseous abnormality. IMPRESSION: 1. No acute findings. Electronically signed by: Morgane Naveau MD 03/11/2024 05:24 PM EST RP Workstation: [id number]   CT ABDOMEN PELVIS W CONTRAST Result Date: 02/19/2024 CLINICAL DATA:  Acute abdominal pain. EXAM: CT ABDOMEN AND PELVIS WITH CONTRAST TECHNIQUE: Multidetector CT imaging of the abdomen and pelvis was performed using the standard protocol following bolus administration of  intravenous contrast. RADIATION DOSE REDUCTION: This exam was performed according to the departmental dose-optimization program which includes automated exposure control, adjustment of the mA and/or kV according to patient size and/or use of iterative reconstruction technique. CONTRAST:  OMNIPAQUE  IOHEXOL  300  MG/ML  SOLN COMPARISON:  08/20/2022 FINDINGS: Lower chest: Subsegmental area of atelectasis in the anterior left lower lobe. No pleural fluid. Hepatobiliary: No focal liver abnormality. Multiple gallstones within physiologically distended gallbladder. There are stones in the gallbladder neck. No gallbladder wall thickening or pericholecystic stranding on CT. Common bile duct is upper normal proximally at 7 mm, 6 mm distally. No visible choledocholithiasis. Pancreas: Parenchymal atrophy. No ductal dilatation or inflammation. Spleen: Calcified granuloma. No splenomegaly. Calcified splenic artery aneurysm measures up to 12 mm, unchanged. Adrenals/Urinary Tract: No adrenal nodule. No hydronephrosis or perinephric edema. Homogeneous renal enhancement with symmetric excretion on delayed phase imaging. Right renal cyst. No further follow-up imaging is recommended. Urinary bladder is partially distended. Mild bladder wall thickening. Stomach/Bowel: Long segment colonic wall thickening extending from the proximal transverse through the sigmoid with pericolonic edema and enhancement. No bowel pneumatosis or perforation. The appendix is normal. A few fluid-filled loops of small bowel are likely reactive. No small bowel obstruction. Small hiatal hernia. Vascular/Lymphatic: Aortic atherosclerosis without aneurysm. Patent portal vein. 12 mm calcified splenic artery aneurysm. No abdominopelvic adenopathy. Reproductive: Hysterectomy.  No adnexal mass. Other: No free air, free fluid, or intra-abdominal fluid collection. Small fat containing umbilical hernia. Musculoskeletal: Degenerative disc disease and facet hypertrophy in the lumbar spine. There are no acute or suspicious osseous abnormalities. IMPRESSION: 1. Colitis extending from the proximal transverse through the sigmoid. This may be infectious or inflammatory. 2. Cholelithiasis without CT findings of acute cholecystitis. 3. Mild bladder wall thickening, potentially reactive.  Aortic Atherosclerosis (ICD10-I70.0). Electronically Signed   By: Andrea Gasman M.D.   On: 02/19/2024 19:07   CT Head Wo Contrast Result Date: 02/18/2024 EXAM: CT HEAD WITHOUT CONTRAST 02/18/2024 10:32:51 AM TECHNIQUE: CT of the head was performed without the administration of intravenous contrast. Automated exposure control, iterative reconstruction, and/or weight based adjustment of the mA/kV was utilized to reduce the radiation dose to as low as reasonably achievable. COMPARISON: CT head 12/25/2023 and MRI brain 07/15/2018. CLINICAL HISTORY: Mental status change, unknown cause. FINDINGS: BRAIN AND VENTRICLES: No acute intracranial hemorrhage. There is overall similar mild-to-moderate scattered white matter hypodensities which are nonspecific but most commonly represent chronic microvascular ischemic changes. No evidence of acute territorial infarct. No hydrocephalus. No extra-axial collection. No mass effect or midline shift. ORBITS: No acute abnormality. SINUSES: There is partial opacification of the frontal and left ethmoid sinuses most notably. SOFT TISSUES AND SKULL: No acute soft tissue abnormality. IMPRESSION: 1. No acute intracranial hemorrhage, mass effect, or evidence of acute territorial infarction. 2. No substantial change since 12/25/2023. Electronically signed by: Prentice Spade MD 02/18/2024 10:52 AM EST RP Workstation: [id number]   DG Chest 2 View Result Date: 02/18/2024 CLINICAL DATA:  pneumonia evaluation EXAM: CHEST - 2 VIEW COMPARISON:  March 25, 2019, August 20, 2022 FINDINGS: The cardiomediastinal silhouette is unchanged in contour. No pleural effusion. No pneumothorax. No acute pleuroparenchymal abnormality. Visualized abdomen is unremarkable. Multilevel degenerative changes of the thoracic spine. IMPRESSION: No acute cardiopulmonary abnormality. Electronically Signed   By: Corean Salter M.D.   On: 02/18/2024 10:26    Microbiology: Results for orders placed or performed  during the hospital encounter of 03/12/24  Respiratory (~20 pathogens) panel by PCR     Status: None  Collection Time: 03/12/24  7:12 AM   Specimen: Nasopharyngeal Swab; Respiratory  Result Value Ref Range Status   Adenovirus NOT DETECTED NOT DETECTED Final   Coronavirus 229E NOT DETECTED NOT DETECTED Final    Comment: (NOTE) The Coronavirus on the Respiratory Panel, DOES NOT test for the novel  Coronavirus (2019 nCoV)    Coronavirus HKU1 NOT DETECTED NOT DETECTED Final   Coronavirus NL63 NOT DETECTED NOT DETECTED Final   Coronavirus OC43 NOT DETECTED NOT DETECTED Final   Metapneumovirus NOT DETECTED NOT DETECTED Final   Rhinovirus / Enterovirus NOT DETECTED NOT DETECTED Final   Influenza A NOT DETECTED NOT DETECTED Final   Influenza B NOT DETECTED NOT DETECTED Final   Parainfluenza Virus 1 NOT DETECTED NOT DETECTED Final   Parainfluenza Virus 2 NOT DETECTED NOT DETECTED Final   Parainfluenza Virus 3 NOT DETECTED NOT DETECTED Final   Parainfluenza Virus 4 NOT DETECTED NOT DETECTED Final   Respiratory Syncytial Virus NOT DETECTED NOT DETECTED Final   Bordetella pertussis NOT DETECTED NOT DETECTED Final   Bordetella Parapertussis NOT DETECTED NOT DETECTED Final   Chlamydophila pneumoniae NOT DETECTED NOT DETECTED Final   Mycoplasma pneumoniae NOT DETECTED NOT DETECTED Final    Comment: Performed at [facility], [address] [place], [place] [address]  MRSA Next Gen by PCR, Nasal     Status: None   Collection Time: 03/12/24  8:07 AM   Specimen: Nasal Mucosa; Nasal Swab  Result Value Ref Range Status   MRSA by PCR Next Gen NOT DETECTED NOT DETECTED Final    Comment: (NOTE) The GeneXpert MRSA Assay (FDA approved for NASAL specimens only), is one component of a comprehensive MRSA colonization surveillance program. It is not intended to diagnose MRSA infection nor to guide or monitor treatment for MRSA infections. Test performance is not FDA approved in patients less than 41  years old. Performed at [facility], [address] [place], [place] [address]   Urine Culture (for pregnant, neutropenic or urologic patients or patients with an indwelling urinary catheter)     Status: None   Collection Time: 03/12/24  8:47 PM   Specimen: Urine, Clean Catch  Result Value Ref Range Status   Specimen Description URINE, CLEAN CATCH  Final   Special Requests NONE  Final   Culture   Final    NO GROWTH Performed at [facility], [address], [place], [place] [address]    Report Status 03/14/2024 FINAL  Final    Labs: CBC: Recent Labs  Lab 03/11/24 1726 03/12/24 0305 03/12/24 0306 03/13/24 0517 03/14/24 0405  WBC 3.2* 2.8*  --  2.5*  2.5* 2.5*  NEUTROABS 2.2 1.6*  --  1.2*  --   HGB 12.0 12.2 12.9 11.2*  11.3* 11.4*  HCT 36.8 39.1 38.0 34.6*  34.5* 34.1*  MCV 86.4 90.5  --  86.5  85.4 83.6  PLT 193 186  --  160  148* 152   Basic Metabolic Panel: Recent Labs  Lab 03/11/24 1726 03/12/24 0306 03/13/24 0517 03/14/24 0405  NA 142 140 141 142  K 4.1 3.8 3.3* 3.9  CL 103 102 102 105  CO2 30  --  30 29  GLUCOSE 108* 106* 82 90  BUN 10 8 10 12   CREATININE 0.71 0.80 0.70 0.68  CALCIUM  9.7  --  9.0 9.2   Liver Function Tests: Recent Labs  Lab 03/11/24 1726  AST 29  ALT 23  ALKPHOS 105  BILITOT 0.6  PROT 7.0  ALBUMIN 4.2   CBG: Recent Labs  Lab 03/11/24 1656 03/12/24 0254  GLUCAP 99 103*    Discharge time spent: greater than 30 minutes.  Signed: Nydia Distance, MD [facility] 03/14/2024 "

## 2024-03-14 NOTE — Evaluation (Signed)
 Physical Therapy Evaluation & Discharge Patient Details Name: Meghan Schwartz MRN: [id number] DOB: 1950-03-05 Today's Date: 03/14/2024  History of Present Illness  74 y.o. female admitted 03/12/24 with AMS, balance impairments, lethargy. Workup for fever, acute hypoxic respiratory failure, SIRS/sepsis of unclear etiology. Concern for possible LE cellulitis. PMH includes HF, CAD, HTN, RA, PNA, depression, anxiety.   Clinical Impression  Patient evaluated by Physical Therapy with no further acute PT needs identified. PTA, pt typically indep household ambulator with intermittent use of DME for community mobility, lives with husband; h/o falls and already has plans to start OPPT regarding balance issues. Today, pt able to ambulate in hallway at supervision-level; husband present and supportive. They are preparing for d/c today. All education has been completed and the patient has no further questions. Acute PT is signing off. Thank you for this referral.      If plan is discharge home, recommend the following: Assistance with cooking/housework;Assist for transportation   Can travel by private vehicle    Yes    Equipment Recommendations None recommended by PT  Recommendations for Other Services   N/A   Functional Status Assessment       Precautions / Restrictions Precautions Precautions: Fall Recall of Precautions/Restrictions: Intact Restrictions Weight Bearing Restrictions Per Provider Order: No      Mobility  Bed Mobility Overal bed mobility: Modified Independent                  Transfers Overall transfer level: Independent Equipment used: None                    Ambulation/Gait Ambulation/Gait assistance: Contact guard assist, Supervision Gait Distance (Feet): 120 Feet Assistive device: 1 person hand held assist, None Gait Pattern/deviations: Step-through pattern, Decreased stride length Gait velocity: Decreased     General Gait Details: pt requesting  initial HHA for balance since just waking up, progressing to no UE support and supervision for safety; slow, guarded gait  Stairs            Wheelchair Mobility     Tilt Bed    Modified Rankin (Stroke Patients Only)       Balance Overall balance assessment: Needs assistance Sitting-balance support: No upper extremity supported, Feet supported Sitting balance-Leahy Scale: Good     Standing balance support: No upper extremity supported, During functional activity Standing balance-Leahy Scale: Fair                               Pertinent Vitals/Pain Pain Assessment Pain Assessment: Faces Faces Pain Scale: Hurts a little bit Pain Descriptors / Indicators: Headache Pain Intervention(s): Monitored during session    Home Living Family/patient expects to be discharged to:: Private residence Living Arrangements: Spouse/significant other Available Help at Discharge: Family;Available 24 hours/day Type of Home: House Home Access: Stairs to enter Entrance Stairs-Rails: None Entrance Stairs-Number of Steps: 2 Alternate Level Stairs-Number of Steps: flight Home Layout: Two level;Bed/bath upstairs Home Equipment: Cane - single point;Rolling Walker (2 wheels);Rollator (4 wheels)      Prior Function Prior Level of Function : Driving;Independent/Modified Independent;History of Falls (last six months)             Mobility Comments: mod I with intermittent use of SPC and Rollator for community distances, in home no AD. h/o falls, which pt and husband attribute to h/o RA with toe deformity affecting balance. already set up to start OPPT next week  Extremity/Trunk Assessment   Upper Extremity Assessment Upper Extremity Assessment: Overall WFL for tasks assessed    Lower Extremity Assessment Lower Extremity Assessment: Overall WFL for tasks assessed       Communication   Communication Communication: No apparent difficulties    Cognition Arousal:  Alert Behavior During Therapy: WFL for tasks assessed/performed   PT - Cognitive impairments: No apparent impairments                       PT - Cognition Comments: WFL for simple tasks, not formally assessed. reports slow and groggy due to just waking up (it always takes me a while to wake up) Following commands: Intact       Cueing Cueing Techniques: Verbal cues     General Comments General comments (skin integrity, edema, etc.): husband present and supportive, they are preparing for d/c home today and OPPT to start 03/17/23.    Exercises     Assessment/Plan    PT Assessment All further PT needs can be met in the next venue of care  PT Problem List Decreased activity tolerance;Decreased balance;Decreased mobility       PT Treatment Interventions      PT Goals (Current goals can be found in the Care Plan section)  Acute Rehab PT Goals Patient Stated Goal: home today PT Goal Formulation: All assessment and education complete, DC therapy    Frequency       Co-evaluation               AM-PAC PT 6 Clicks Mobility  Outcome Measure Help needed turning from your back to your side while in a flat bed without using bedrails?: None Help needed moving from lying on your back to sitting on the side of a flat bed without using bedrails?: None Help needed moving to and from a bed to a chair (including a wheelchair)?: None Help needed standing up from a chair using your arms (e.g., wheelchair or bedside chair)?: None Help needed to walk in hospital room?: A Little Help needed climbing 3-5 steps with a railing? : A Little 6 Click Score: 22    End of Session   Activity Tolerance: Patient tolerated treatment well Patient left: in bed;with call bell/phone within reach;with family/visitor present Nurse Communication: Mobility status PT Visit Diagnosis: Other abnormalities of gait and mobility (R26.89);Unsteadiness on feet (R26.81)    Time: 8597-8582 PT Time  Calculation (min) (ACUTE ONLY): 15 min   Charges:   PT Evaluation $PT Eval Moderate Complexity: 1 Mod   PT General Charges $$ ACUTE PT VISIT: 1 Visit       Darice Almas, PT, DPT Acute Rehabilitation Services  Personal: Secure Chat Rehab Office: [phone]  Darice LITTIE Almas 03/14/2024, 2:49 PM

## 2024-03-15 ENCOUNTER — Ambulatory Visit: Admitting: Podiatry

## 2024-03-16 LAB — CULTURE, BLOOD (ROUTINE X 2)
Culture: NO GROWTH
Culture: NO GROWTH
Special Requests: ADEQUATE
Special Requests: ADEQUATE

## 2024-03-19 ENCOUNTER — Telehealth: Payer: Self-pay | Admitting: *Deleted

## 2024-03-19 NOTE — Telephone Encounter (Signed)
-----   Message from Nurse Geni BRAVO, RN sent at 03/16/2024  9:05 AM EST -----  ----- Message ----- From: Janet Olam NOVAK Sent: 03/16/2024   7:35 AM EST To: Geni LITTIE Sar, RN  Patient has called and left 2 VM's on the Admin Asst Line in Imaging that she needs to talk with Dr Okey about some meds. She was just D/C 'd from the hospital she states and needs to talk to someone asap. Please can you contact her.

## 2024-03-19 NOTE — Telephone Encounter (Signed)
 LVMTCB 03/19/24 at 8:08 am

## 2024-03-20 ENCOUNTER — Telehealth: Payer: Self-pay | Admitting: Physician Assistant

## 2024-03-20 NOTE — Telephone Encounter (Addendum)
" ° ° ° °  Attempted to call to discuss page pertaining to medication review after recent hospital discharge. No answer; left voicemail.   Patient returned call. We discussed torsemide  dosing. She was discharged on 03/14/2024 with torsemide  20 mg BID; however, prior to hospitalization she was taking torsemide  20 mg MWF, which was confirmed by review of the last office visit note dated 02/27/2023. Chart review shows no evidence of volume overload during 02/2023 hospitalization. The patient denies evidence of volume overload. We discussed and agreed to resume torsemide  20 mg MWF. Patient was advised to contact the office with any change in symptoms or signs of volume overload.   Signed, Lorette CINDERELLA Kapur, PA-C 03/20/2024, 1:59 PM Pager: [phone]  "

## 2024-03-22 ENCOUNTER — Ambulatory Visit: Admitting: Physical Therapy

## 2024-03-23 NOTE — Telephone Encounter (Signed)
 Patient received a phone call from Sheron RIGGERS [facility]) on1/24/26 to go over recent discharge medication  question

## 2024-03-29 ENCOUNTER — Other Ambulatory Visit (HOSPITAL_BASED_OUTPATIENT_CLINIC_OR_DEPARTMENT_OTHER)

## 2024-03-30 ENCOUNTER — Ambulatory Visit: Admitting: Emergency Medicine

## 2024-03-30 ENCOUNTER — Encounter: Payer: Self-pay | Admitting: Emergency Medicine

## 2024-03-30 VITALS — BP 98/54 | HR 66 | Ht 64.0 in | Wt 154.0 lb

## 2024-03-30 DIAGNOSIS — L03119 Cellulitis of unspecified part of limb: Secondary | ICD-10-CM | POA: Diagnosis not present

## 2024-03-30 DIAGNOSIS — I5032 Chronic diastolic (congestive) heart failure: Secondary | ICD-10-CM | POA: Diagnosis not present

## 2024-03-30 DIAGNOSIS — I2510 Atherosclerotic heart disease of native coronary artery without angina pectoris: Secondary | ICD-10-CM

## 2024-03-30 DIAGNOSIS — I10 Essential (primary) hypertension: Secondary | ICD-10-CM | POA: Diagnosis not present

## 2024-03-30 MED ORDER — TORSEMIDE 20 MG PO TABS: 20.0000 mg | ORAL_TABLET | ORAL | 3 refills | Status: AC

## 2024-03-30 MED ORDER — POTASSIUM CHLORIDE CRYS ER 20 MEQ PO TBCR: 20.0000 meq | EXTENDED_RELEASE_TABLET | ORAL | 3 refills | Status: AC

## 2024-03-30 MED ORDER — METOPROLOL TARTRATE 25 MG PO TABS: 12.5000 mg | ORAL_TABLET | Freq: Every day | ORAL | Status: AC

## 2024-03-30 NOTE — Patient Instructions (Signed)
 Medication Instructions:  Your physician recommends that you continue on your current medications as directed. Please refer to the Current Medication list given to you today.  *If you need a refill on your cardiac medications before your next appointment, please call your pharmacy*  Lab Work: BMP, CBC and BNP If you have labs (blood work) drawn today and your tests are completely normal, you will receive your results only by: MyChart Message (if you have MyChart) OR A paper copy in the mail If you have any lab test that is abnormal or we need to change your treatment, we will call you to review the results.  Testing/Procedures: NONE ORDERED  Follow-Up: At [facility], you and your health needs are our priority.  As part of our continuing mission to provide you with exceptional heart care, our providers are all part of one team.  This team includes your primary Cardiologist (physician) and Advanced Practice Providers or APPs (Physician Assistants and Nurse Practitioners) who all work together to provide you with the care you need, when you need it.  Your next appointment:   4-6 month(s)  Provider:   Miriam Shams, NP          We recommend signing up for the patient portal called MyChart.  Sign up information is provided on this After Visit Summary.  MyChart is used to connect with patients for Virtual Visits (Telemedicine).  Patients are able to view lab/test results, encounter notes, upcoming appointments, etc.  Non-urgent messages can be sent to your provider as well.   To learn more about what you can do with MyChart, go to [email or web address].

## 2024-03-30 NOTE — Assessment & Plan Note (Signed)
 BP slightly lower today than her documented baseline  We will decrease metoprolol  to tartrate to 12.5 mg daily Patient will contact us  if her BP becomes consistently elevated above goal of 130/80 Continue torsemide  20 mg every other day with 20 meq potassium

## 2024-03-31 LAB — BASIC METABOLIC PANEL WITH GFR
BUN/Creatinine Ratio: 19 (ref 12–28)
BUN: 14 mg/dL (ref 8–27)
CO2: 27 mmol/L (ref 20–29)
Calcium: 9.5 mg/dL (ref 8.7–10.3)
Chloride: 103 mmol/L (ref 96–106)
Creatinine, Ser: 0.72 mg/dL (ref 0.57–1.00)
Glucose: 92 mg/dL (ref 70–99)
Potassium: 4.5 mmol/L (ref 3.5–5.2)
Sodium: 146 mmol/L — AB (ref 134–144)
eGFR: 88 mL/min/{1.73_m2}

## 2024-03-31 LAB — CBC
Hematocrit: 38.6 % (ref 34.0–46.6)
Hemoglobin: 12.4 g/dL (ref 11.1–15.9)
MCH: 28.1 pg (ref 26.6–33.0)
MCHC: 32.1 g/dL (ref 31.5–35.7)
MCV: 88 fL (ref 79–97)
Platelets: 167 10*3/uL (ref 150–450)
RBC: 4.41 x10E6/uL (ref 3.77–5.28)
RDW: 12.7 % (ref 11.7–15.4)
WBC: 4.2 10*3/uL (ref 3.4–10.8)

## 2024-03-31 LAB — BRAIN NATRIURETIC PEPTIDE: BNP: 185.9 pg/mL — AB (ref 0.0–100.0)

## 2024-03-31 NOTE — Assessment & Plan Note (Signed)
 Meghan Schwartz

## 2024-03-31 NOTE — Progress Notes (Unsigned)
 "  Office Visit Note  Patient: Meghan Schwartz             Date of Birth: 01/26/1951           MRN: [id number]             PCP: Chet Mad, DO Referring: Chet Mad, DO Visit Date: 04/12/2024   Subjective:  No chief complaint on file.   History of Present Illness: Meghan Schwartz is a 74 y.o. female here for follow up for RA on Enbrel  50 mg [place] weekly and severe knee osteoarthritis.   Previous HPI 10/07/2023 Meghan Schwartz is a 74 y.o. female here for follow up for RA on Enbrel  50 mg [place] weekly and severe knee osteoarthritis.     She underwent knee replacement surgery in February and has experienced significant improvement in her condition. She had excellent postop recovery with good knee ROM achieved and she was discharged from rehabilitation. She did not require prolonged use of pain medication post-surgery.   Following the knee replacement, she experienced a fall while visiting a neighbor, resulting in a hematoma on her right leg. X-rays confirmed the diagnosis, and it was noted that it would take months to resolve. She has been receiving follow-up care, and the condition is reportedly improving.   She continues to use Enbrel  injections and reports no major infections since the last visit, although she did contract COVID-19. She initially mistook the symptoms for a sinus infection and was diagnosed at a walk-in clinic. She was treated with Paxlovid and amoxicillin , which resolved her symptoms, and she quarantined herself during the illness.   She received a cortisone injection for the right knee approximately three to four weeks ago, which has since worn off. During that visit she reports 42 milliliters of fluid were aspirated from her knee.        Previous HPI 03/19/2023 Meghan Schwartz is a 74 y.o. female here for follow up for RA on Enbrel  50 mg [place] weekly and severe osteoarthritis. She is scheduled for a knee replacement surgery on 2/17.  They also have a Baker's cyst, which  causes sometimes causes more discomfort than the knee surface itself, and they wonder if it will be addressed during the surgery.   The patient is currently taking Enbrel  for their arthritis, but they have been advised to pause this medication two weeks before and after their surgery to minimize the risk of delayed wound healing or bacterial infection.   The patient also reports pain on the side of their leg, which they describe as a pressing sensation in three specific areas. They note that the pain is more pronounced when they walk.   In addition to their arthritis, the patient has a history of low potassium levels. They recently experienced an overnight weight gain of eight pounds, which they attribute to a change in their medication regimen. They had been taking torsemide  and potassium daily, but a new physician's assistant recommended they take these medications only on Monday, Wednesday, and Friday. After the weight gain, they returned to their daily regimen and lost the weight.   They also report that their skin is shedding and they have some scabbing and lesions, which they are trying not to scratch.     Previous HPI 12/18/22 Meghan Schwartz is a 74 y.o. female here for follow up for rheumatoid arthritis on Enbrel  50 mg [place] weekly and regarding chronic osteoarthritis pain in the left knee.  Again had temporary  symptom relief after aspiration injection of knee at her last visit but still has pain and instability and catching while weightbearing.  She developed trouble with her left eye with what she describes as some type of membrane associated with the previous cataract surgery.  Seeing Dr. Octavia for management.  She is also discussing knee replacement surgery with Dr. Theotis.  They talked at some length regarding she has high pre-existing cardiovascular risk factors for surgery but current function and quality of life is severely impaired and she has had only limited response to numerous  injections and oral medications and does not want to go back on high-dose pain medicine.   Previous HPI 10/30/22 Meghan Schwartz is a 74 y.o. female here for follow up today for left knee pain and swelling. This is worse especially with pain in the posterior knee in the past week. This was not preceded by any specific injury illness or new activity. She is not wearing the knee brace recommended by Dr. Addie because it is too bulky on her swollen knee and hits against the right knee. Also has pain and tenderness in an area below and lateral to the knee that is chronic but worse lately. She remains on enbrel  50 mg [place] weekly without interruption for her RA and no increased swelling elsewhere.   Previous HPI 09/11/22 Meghan Schwartz is a 74 y.o. female here for follow up for rheumatoid arthritis on Enbrel  50 mg subcu weekly and osteoarthritis of the left knee.  We last saw her in April with repeat aspiration of the knee and popliteal cyst with no steroid injection at the time due to recent treatment.  She feels the cyst and back of knee pain has remained improved since that time.  She had a hospitalization last month due to severe hypokalemia down to 2.0.  Had preceding symptoms of worsening muscle weakness with change in posture and worsening mobility for the preceding weeks until waking up with profound muscle weakness inability to stand and severe constipation.  Had some adjustment with her torsemide  and potassium supplementation and now back to about her baseline.   Previous HPI 07/30/22 Meghan Schwartz is a 74 y.o. female with rheumatoid arthritis on Enbrel  50 mg subcu weekly and osteoarthritis here for left knee joint pain and swelling.  At her last visit she felt there was more improvement in her knee pain with the Baker's cyst aspiration as well as joint injection versus previous aspiration injections at only the suprapatellar pouch.  Subsequently saw Dr. Addie for evaluation of her severe primary osteoarthritis of  the knee and also underwent joint aspiration that time with 45 cc fluid removed.  He discussed options for her realistically the only available procedure would be total knee arthroplasty.  She is hesitant about whether or not to pursue this due to associated pain and required rehabilitation and also cardiovascular risk.  Though she was cleared by her cardiologist to proceed with this if she wishes.  Currently has back due to increasing pain and swelling in the knee again and she is interested whether the popliteal cyst could be aspirated or injected again.  She is not noticing increased pain or swelling in other joints besides the knee.  No interruption or change to her Enbrel  or other medications. No new infections or injuries.   Previous HPI 06/12/22 Meghan Schwartz is a 75 y.o. female here for follow up for RA on Enbrel  50 mg [place] weekly and osteoarthritis with chronic left  knee pain and swelling. We aspirated the left knee in March but she saw quick re accumulation of fluid.  Outside of left knee has not experienced much exacerbation of upper extremity pain.  Also describes some clicking or popping sensation frequently occurring and with decreased steadiness on her feet.  She is usually able to walk adequately with some form of assistive device at least a cane.   Previous HPI 05/06/22 Meghan Schwartz is a 74 y.o. female here for follow up for RA on Enbrel  50 mg subcu weekly and osteoarthritis.  Since her last visit she had an issue with worsening pedal edema and a very large associated weight gain.  She was referred to see Dr. Okey with cardiology.  She was prescribed metolazone  to combine with her torsemide  and saw a rapid diuresis with 15 pound weight loss in 1 day.  Initially had associated skin peeling since then still has erythema and rough or thick skin over the affected areas but without any reaccumulation of the weight and pitting fluid.  Blood pressure has been somewhat low.  She has had some falls due to  instability she is noticing trouble where she cannot apply weight onto her right foot evenly just gets pressure around the base of the second and third toe.  And left knee is causing more problems with reaccumulation of the large effusion and also with posterior swelling from baker's cyst.     Previous HPI 02/05/21 Meghan Schwartz is a 74 y.o. female her for rheumatoid arthritis on Enbrel  50 mg [place] weekly for which she has been seeing Dr. Ishmael previously. She was originally diagnosed about 10 or 11 years ago due to development of joint pain, stiffness, synovitis, and progressive joint deformities primarily starting in the feet and knees.  Subsequently proceeded to have increased joint pains involving bilateral hands with some swelling at the PIP and MCP joints.  She started treatment with Enbrel  injections with some improvement in the lower extremity joint pains she recalls still having a lot of pain symptoms and pronounced fatigue that did not clear with this treatment.  Combination treatment was tried with methotrexate with significant GI intolerance.  She tried leflunomide but developed substantial alopecia so discontinued the medicine.  She switched to hydroxychloroquine  combination treatment which was being well-tolerated but she has had multiple new or worsening medical problems over the past few years with concern of medication related effects. She had hand numbness in the past improved after left carpal tunnel release surgery but she has some residual muscle atrophy in thenar prominence. She had left knee meniscectomy in 2020. She suffered NSTEMI in 01/2018 with DES placement in RCA for 99% blockage and has chronic diastolic CHF. Her edema is generally controlled while taking torsemide  but does rapidly re accumulate fluid off this medication. Ophthalmology evaluation indicated significant visual field deficits findings consistent with age-related macular degeneration of both eyes and recommended  avoidance of this medication.  She also has considerable osteoarthritis especially in her knees and feet previous surgical reconstruction on the left foot. Overall she feels her gait is not very stable due to these problems.   DMARD Hx Enbrel  - current   HCQ - macular disease? LEF - alopecia MTX - GI intolerance.      No Rheumatology ROS completed.   PMFS History:  Patient Active Problem List   Diagnosis Date Noted   Fever 03/12/2024   SIRS (systemic inflammatory response syndrome) (HCC) 03/12/2024   Acute respiratory failure with hypoxia (  HCC) 03/12/2024   History of Clostridium difficile infection 03/12/2024   Traumatic injury of head with loss of vision 10/16/2023   Primary osteoarthritis of left knee 04/14/2023   Hypocalcemia 08/21/2022   Falls, initial encounter 08/21/2022   Prolonged QT interval 08/21/2022   Acute metabolic encephalopathy 08/21/2022   Essential hypertension 08/21/2022   AKI (acute kidney injury) 08/21/2022   Chronic pain 08/21/2022   Chronic diastolic CHF (congestive heart failure) (HCC) 08/21/2022   Effusion, left knee 06/12/2022   Leg edema, left 12/25/2021   Claw toe, left 08/07/2021   Dog bite 03/05/2021   High risk medication use 02/05/2021   Dry mouth 02/05/2021   Pain in left foot 01/31/2021   Bunion of great toe of right foot 06/15/2020   Hypoxia 03/05/2019   Derangement of posterior horn of medial meniscus 02/02/2019   Derangement of posterior horn of lateral meniscus 02/02/2019   Meniscal cyst, unspecified laterality 12/01/2018   Torn medial meniscus 12/01/2018   OA (osteoarthritis) of knee 12/01/2018   Coronary artery disease involving native coronary artery of native heart without angina pectoris 09/14/2018   (HFpEF) heart failure with preserved ejection fraction (HCC)    Chronic pain of left knee 05/15/2018   Mass of left knee 05/15/2018   Non-ST elevation (NSTEMI) myocardial infarction [facility])    Chest pain 01/25/2018   RA  (rheumatoid arthritis) (HCC) 01/25/2018   Hyperlipidemia 01/25/2018   Hypokalemia 01/25/2018   Depression with anxiety 01/25/2018   Tobacco abuse 01/25/2018   Lower extremity cellulitis 01/25/2018   Benzodiazepine dependence (HCC)    MDD (major depressive disorder), recurrent severe, without psychosis (HCC) 10/02/2017   CTS (carpal tunnel syndrome) 06/08/2014    Past Medical History:  Diagnosis Date   Allergic rhinitis    Anxiety    Benzodiazepine dependence (HCC)    Bruxism    CAD (coronary artery disease)    S/p NSTEMI 01/2018 >> LHC: oLAD 20, pLAD 20; oD1 20, oLCx 40, pRCA 99 >> PCI: DES to prox RCA // Echo 01/2018: EF 50-55; prob inf-lat and inf HK   Chronic diastolic heart failure    Echocardiogram 07/2018: EF 55-60, grade 1 diastolic dysfunction, normal wall motion, normal GLS (-22.3), PASP 28   Chronic foot pain    bunions, torn ligaments-on chronic pain medication   CTS (carpal tunnel syndrome) 06/08/2014   Depression    High cholesterol    Hypertension    Low blood potassium    MDD (major depressive disorder) 10/02/2017   Myocardial infarction (HCC) 02/01/2018   Pneumonia 1986   left lung   RA (rheumatoid arthritis) (HCC)     Family History  Problem Relation Age of Onset   Thyroid  disease Mother    Breast cancer Mother    Parkinson's disease Father    Raynaud syndrome Maternal Aunt    Arthritis Maternal Grandmother    Heart attack Maternal Grandmother    Stroke Paternal Grandmother    Heart attack Paternal Grandfather    Past Surgical History:  Procedure Laterality Date   BTL  2000   CARPAL TUNNEL RELEASE Left    CESAREAN SECTION  1978. 1983   CORONARY STENT INTERVENTION N/A 01/27/2018   Procedure: CORONARY STENT INTERVENTION;  Surgeon: Verlin Lonni BIRCH, MD;  Location: [facility];  Service: Cardiovascular;  Laterality: N/A;   eye implants     FOOT SURGERY Left 2008   front teeth removed after injury     KNEE ARTHROSCOPY WITH LATERAL  MENISECTOMY Left  02/02/2019   Procedure: LEFT KNEE ARTHROSCOPY, MEDIAL AND LATERAL MENISECTOMY, EXCISION OF LEFT LATERAL MENISCAL CYST;  Surgeon: Anderson Maude ORN, MD;  Location: [facility];  Service: Orthopedics;  Laterality: Left;   LEFT HEART CATH AND CORONARY ANGIOGRAPHY N/A 01/27/2018   Procedure: LEFT HEART CATH AND CORONARY ANGIOGRAPHY;  Surgeon: Verlin Lonni BIRCH, MD;  Location: [facility];  Service: Cardiovascular;  Laterality: N/A;   pre cancerous lesion removed from forehead     TOTAL ABDOMINAL HYSTERECTOMY W/ BILATERAL SALPINGOOPHORECTOMY  2002   Dr. Horacio   TOTAL KNEE ARTHROPLASTY Left 04/14/2023   Procedure: TOTAL KNEE ARTHROPLASTY;  Surgeon: Melodi Lerner, MD;  Location: [facility];  Service: Orthopedics;  Laterality: Left;   VAGINAL HYSTERECTOMY  2001   Social History   Social History Narrative   Lives at home with spouse.    Right handed.   Caffeine use: 2 cups coffee/day    8 oz soda/day    Retired    Immunization History  Administered Date(s) Administered   PFIZER(Purple Top)SARS-COV-2 Vaccination 04/10/2019, 05/05/2019, 10/19/2019, 06/20/2020   Pfizer Covid-19 Vaccine Bivalent Booster 44yrs & up 11/29/2020   Pfizer(Comirnaty)Fall Seasonal Vaccine 12 years and older 12/18/2023   Tdap 04/03/2018     Objective: Vital Signs: There were no vitals taken for this visit.   Physical Exam   Musculoskeletal Exam: ***   Investigation: No additional findings.  Imaging: DG Chest Portable 1 View Result Date: 03/12/2024 EXAM: 1 VIEW XRAY OF THE CHEST 03/12/2024 05:15:00 AM COMPARISON: Portable chest 03/11/2024. CLINICAL HISTORY: New oxygen  requirement. FINDINGS: LUNGS AND PLEURA: Linear atelectatic foci are again noted in the lung bases. No focal pneumonia is evident. No pleural effusion. No pneumothorax. HEART AND MEDIASTINUM: There is aortic tortuosity and atherosclerosis with stable mediastinum. The cardiac size is normal. There are overlying telemetry leads.  BONES AND SOFT TISSUES: No acute osseous abnormality. IMPRESSION: 1. Linear atelectatic foci in the lung bases without appreciable  pneumonia. 2. No acute cardiopulmonary abnormality. Electronically signed by: Francis Quam MD 03/12/2024 05:26 AM EST RP Workstation: [id number]   CT Angio Chest PE W and/or Wo Contrast Result Date: 03/11/2024 EXAM: CTA CHEST PE WITHOUT AND WITH CONTRAST CT ABDOMEN AND PELVIS WITHOUT AND WITH CONTRAST 03/11/2024 07:34:31 PM TECHNIQUE: CTA of the chest was performed after the administration of 75 mL of iohexol  (OMNIPAQUE ) 350 MG/ML injection. Multiplanar reformatted images are provided for review. MIP images are provided for review. CT of the abdomen and pelvis was performed without and with the administration of intravenous contrast. Automated exposure control, iterative reconstruction, and/or weight based adjustment of the mA/kV was utilized to reduce the radiation dose to as low as reasonably achievable. COMPARISON: 08/20/2022 and 02/19/2024. CLINICAL HISTORY: Pulmonary embolism (PE) high probability, altered mental status, generalized weakness, anorexia, somnolence, colitis, urinary tract infection. FINDINGS: CHEST: PULMONARY ARTERIES: Pulmonary arteries are adequately opacified for evaluation. No intraluminal filling defect to suggest pulmonary embolism. Main pulmonary artery is normal in caliber. MEDIASTINUM: No mediastinal lymphadenopathy. The heart and pericardium demonstrate no acute abnormality. Extensive multivessel coronary artery calcifications. Mild atherosclerotic calcification within the thoracic aorta. LUNGS AND PLEURA: The lungs are without acute process. No focal consolidation or pulmonary edema. No pleural effusion or pneumothorax. SOFT TISSUES AND BONES: No acute bone or soft tissue abnormality. ABDOMEN AND PELVIS: LIVER: The liver is unremarkable. GALLBLADDER AND BILE DUCTS: Cholelithiasis without superimposed pericholecystic inflammatory change. No intra or  extrahepatic biliary ductal dilation. SPLEEN: Spleen demonstrates no acute abnormality. PANCREAS: Pancreas demonstrates no acute abnormality.  ADRENAL GLANDS: Adrenal glands demonstrate no acute abnormality. KIDNEYS, URETERS AND BLADDER: Subcortical cyst noted within the lower pole of the right kidney for which no follow up imaging is recommended. No stones in the kidneys or ureters. No hydronephrosis. No perinephric or periureteral stranding. Urinary bladder is unremarkable. GI AND BOWEL: Tiny hiatal hernia. Appendix normal. The stomach, small bowel, and large bowel are otherwise unremarkable. There is no bowel obstruction. No abnormal bowel wall thickening or distension. REPRODUCTIVE: Uterus absent. No adnexal masses. PERITONEUM AND RETROPERITONEUM: No ascites or free air. LYMPH NODES: No lymphadenopathy. BONES AND SOFT TISSUES: Osseous structures are age appropriate. No acute bone abnormality. No lytic or blastic bone lesion. Tiny fat-containing umbilical hernia. Tiny bilateral fat-containing inguinal hernias. Mild aortoiliac atherosclerotic calcification. No aortic aneurysm. No focal soft tissue abnormality. IMPRESSION: 1. No pulmonary embolism. 2. Extensive multivessel coronary artery calcifications. 3. Cholelithiasis without superimposed pericholecystic inflammatory change and no intrahepatic or extrahepatic biliary ductal dilation. 4. Raf score includes aortic atherosclerosis (ICD10-I70.0). Electronically signed by: Dorethia Molt MD 03/11/2024 07:47 PM EST RP Workstation: [id number]   CT ABDOMEN PELVIS W CONTRAST Result Date: 03/11/2024 EXAM: CTA CHEST PE WITHOUT AND WITH CONTRAST CT ABDOMEN AND PELVIS WITHOUT AND WITH CONTRAST 03/11/2024 07:34:31 PM TECHNIQUE: CTA of the chest was performed after the administration of 75 mL of iohexol  (OMNIPAQUE ) 350 MG/ML injection. Multiplanar reformatted images are provided for review. MIP images are provided for review. CT of the abdomen and pelvis was performed  without and with the administration of intravenous contrast. Automated exposure control, iterative reconstruction, and/or weight based adjustment of the mA/kV was utilized to reduce the radiation dose to as low as reasonably achievable. COMPARISON: 08/20/2022 and 02/19/2024. CLINICAL HISTORY: Pulmonary embolism (PE) high probability, altered mental status, generalized weakness, anorexia, somnolence, colitis, urinary tract infection. FINDINGS: CHEST: PULMONARY ARTERIES: Pulmonary arteries are adequately opacified for evaluation. No intraluminal filling defect to suggest pulmonary embolism. Main pulmonary artery is normal in caliber. MEDIASTINUM: No mediastinal lymphadenopathy. The heart and pericardium demonstrate no acute abnormality. Extensive multivessel coronary artery calcifications. Mild atherosclerotic calcification within the thoracic aorta. LUNGS AND PLEURA: The lungs are without acute process. No focal consolidation or pulmonary edema. No pleural effusion or pneumothorax. SOFT TISSUES AND BONES: No acute bone or soft tissue abnormality. ABDOMEN AND PELVIS: LIVER: The liver is unremarkable. GALLBLADDER AND BILE DUCTS: Cholelithiasis without superimposed pericholecystic inflammatory change. No intra or extrahepatic biliary ductal dilation. SPLEEN: Spleen demonstrates no acute abnormality. PANCREAS: Pancreas demonstrates no acute abnormality. ADRENAL GLANDS: Adrenal glands demonstrate no acute abnormality. KIDNEYS, URETERS AND BLADDER: Subcortical cyst noted within the lower pole of the right kidney for which no follow up imaging is recommended. No stones in the kidneys or ureters. No hydronephrosis. No perinephric or periureteral stranding. Urinary bladder is unremarkable. GI AND BOWEL: Tiny hiatal hernia. Appendix normal. The stomach, small bowel, and large bowel are otherwise unremarkable. There is no bowel obstruction. No abnormal bowel wall thickening or distension. REPRODUCTIVE: Uterus absent. No adnexal  masses. PERITONEUM AND RETROPERITONEUM: No ascites or free air. LYMPH NODES: No lymphadenopathy. BONES AND SOFT TISSUES: Osseous structures are age appropriate. No acute bone abnormality. No lytic or blastic bone lesion. Tiny fat-containing umbilical hernia. Tiny bilateral fat-containing inguinal hernias. Mild aortoiliac atherosclerotic calcification. No aortic aneurysm. No focal soft tissue abnormality. IMPRESSION: 1. No pulmonary embolism. 2. Extensive multivessel coronary artery calcifications. 3. Cholelithiasis without superimposed pericholecystic inflammatory change and no intrahepatic or extrahepatic biliary ductal dilation. 4. Raf score includes aortic atherosclerosis (ICD10-I70.0). Electronically signed by:  Dorethia Molt MD 03/11/2024 07:47 PM EST RP Workstation: [id number]   CT Head Wo Contrast Result Date: 03/11/2024 EXAM: CT HEAD WITHOUT CONTRAST 03/11/2024 05:51:26 PM TECHNIQUE: CT of the head was performed without the administration of intravenous contrast. Automated exposure control, iterative reconstruction, and/or weight based adjustment of the mA/kV was utilized to reduce the radiation dose to as low as reasonably achievable. COMPARISON: None available. CLINICAL HISTORY: Mental status change, unknown cause. FINDINGS: BRAIN AND VENTRICLES: Parenchymal volume loss is commensurate with the patient's age. Periventricular white matter changes are present likely reflecting the sequela of small vessel ischemia. No acute hemorrhage. No evidence of acute infarct. No hydrocephalus. No extra-axial collection. No mass effect or midline shift. ORBITS: No acute abnormality. SINUSES: Mastoid air cells and middle ear cavities are clear. There is mucosal thickening within the frontal and left sphenoid sinus as well as numerous opacified left ethmoid air cells in keeping with moderate paranasal sinus disease. SOFT TISSUES AND SKULL: No acute soft tissue abnormality. No skull fracture. IMPRESSION: 1. No acute  intracranial abnormality. 2. Chronic parenchymal volume loss and periventricular white matter changes consistent with small vessel ischemic disease. 3. Moderate paranasal sinus disease involving the frontal, left sphenoid, and left ethmoid sinuses. Electronically signed by: Dorethia Molt MD 03/11/2024 06:01 PM EST RP Workstation: [id number]   DG Chest Portable 1 View Result Date: 03/11/2024 EXAM: 1 VIEW(S) XRAY OF THE CHEST 03/11/2024 05:09:00 PM COMPARISON: 02/18/2024 CLINICAL HISTORY: ams ams ams ams ams Pt arrives with family with concerns of AMS that started yesterday. Family disorientation, generalized weakness, and decreased appetite. Family reports that pt has been sleeping more. Pt seen in past 3 weeks and treated for colitis and UTI. FINDINGS: LUNGS AND PLEURA: Left base atelectasis. No focal pulmonary opacity. No pleural effusion. No pneumothorax. HEART AND MEDIASTINUM: Atherosclerotic plaque. No acute abnormality of the cardiac and mediastinal silhouettes. BONES AND SOFT TISSUES: No acute osseous abnormality. IMPRESSION: 1. No acute findings. Electronically signed by: Kate Plummer MD 03/11/2024 05:24 PM EST RP Workstation: [id number]    Recent Labs: Lab Results  Component Value Date   WBC 4.2 03/30/2024   HGB 12.4 03/30/2024   PLT 167 03/30/2024   NA 146 (H) 03/30/2024   K 4.5 03/30/2024   CL 103 03/30/2024   CO2 27 03/30/2024   GLUCOSE 92 03/30/2024   BUN 14 03/30/2024   CREATININE 0.72 03/30/2024   BILITOT 0.6 03/11/2024   ALKPHOS 105 03/11/2024   AST 29 03/11/2024   ALT 23 03/11/2024   PROT 7.0 03/11/2024   ALBUMIN 4.2 03/11/2024   CALCIUM  9.5 03/30/2024   GFRAA 65 02/04/2020   QFTBGOLDPLUS NEGATIVE 10/07/2023    Speciality Comments: No specialty comments available.  Procedures:  No procedures performed Allergies: Erythromycin   Assessment / Plan:     Visit Diagnoses:  Assessment & Plan Rheumatoid arthritis, involving unspecified site, unspecified whether  rheumatoid factor present (HCC)     High risk medication use      ***  Follow-Up Instructions: No follow-ups on file.   Gracemarie Skeet M Anslie Spadafora, CMA  Note - This record has been created using [employer].  Chart creation errors have been sought, but may not always  have been located. Such creation errors do not reflect on  the standard of medical care. "

## 2024-04-02 ENCOUNTER — Ambulatory Visit: Payer: Self-pay | Admitting: Emergency Medicine

## 2024-04-07 ENCOUNTER — Ambulatory Visit

## 2024-04-12 ENCOUNTER — Ambulatory Visit: Admitting: Internal Medicine

## 2024-04-12 DIAGNOSIS — M069 Rheumatoid arthritis, unspecified: Secondary | ICD-10-CM

## 2024-04-12 DIAGNOSIS — Z79899 Other long term (current) drug therapy: Secondary | ICD-10-CM

## 2024-07-28 ENCOUNTER — Ambulatory Visit: Admitting: Emergency Medicine

## 2024-11-16 ENCOUNTER — Ambulatory Visit (HOSPITAL_BASED_OUTPATIENT_CLINIC_OR_DEPARTMENT_OTHER)
